# Patient Record
Sex: Male | Born: 1940
Health system: Southern US, Community
[De-identification: ages and names within clinical notes are randomized; demographics above are authoritative.]

## PROBLEM LIST (undated history)

## (undated) DIAGNOSIS — I69322 Dysarthria following cerebral infarction: Secondary | ICD-10-CM

## (undated) DIAGNOSIS — D649 Anemia, unspecified: Secondary | ICD-10-CM

## (undated) DIAGNOSIS — N289 Disorder of kidney and ureter, unspecified: Secondary | ICD-10-CM

## (undated) DIAGNOSIS — F0390 Unspecified dementia without behavioral disturbance: Secondary | ICD-10-CM

## (undated) DIAGNOSIS — I639 Cerebral infarction, unspecified: Secondary | ICD-10-CM

## (undated) DIAGNOSIS — M48062 Spinal stenosis, lumbar region with neurogenic claudication: Secondary | ICD-10-CM

## (undated) DIAGNOSIS — J869 Pyothorax without fistula: Secondary | ICD-10-CM

## (undated) DIAGNOSIS — E785 Hyperlipidemia, unspecified: Secondary | ICD-10-CM

## (undated) DIAGNOSIS — M549 Dorsalgia, unspecified: Secondary | ICD-10-CM

## (undated) DIAGNOSIS — I69391 Dysphagia following cerebral infarction: Secondary | ICD-10-CM

## (undated) DIAGNOSIS — F329 Major depressive disorder, single episode, unspecified: Secondary | ICD-10-CM

## (undated) HISTORY — PX: KNEE SURGERY: SHX244

## (undated) HISTORY — PX: INGUINAL HERNIA REPAIR: SUR1180

## (undated) HISTORY — PX: NECK SURGERY: SHX720

## (undated) NOTE — *Deleted (*Deleted)
Speech Language Pathology Treatment: Dysphagia  Patient Details Name: [name] [name] MRN: [id number] DOB: 12/10/1940 Today's Date: 11/06/2019 Time: 1330-1430 SLP Time Calculation (min) (ACUTE ONLY): 60 min  Assessment / Plan / Recommendation Clinical Impression  Pt was seen for f/u toleration of Dysphagia 2 diet w/ thin liquids and education re: diet consistency and aspiration precautions at bedside-- pt assessed directly w/ lunch tray. Sister was present & engaged throughout session. Pt continues to present w/ fatigue and slower, deliberate engagement w/ weakness in overall strength/movements. He needed Mod+ assistance in positioning more upright in chair at bedside.  Per MRI-- ischemic infarct right corona radiata, punctate infarct right parietal lobe, remote lacnuar infarcts involving the bilater thalami, bifrontal white matter, pons; Extensive chronic microvascular ischemic disease. Per head CT-- atrophy, remote lacunar infarcts in the basal ganglia, no acute abnormalities. Unsure of pt's cognitive baseline. Pt drowsy/asleep upon arrival-- generally minimally verbal throughout session; Sister answered many questions re pt's status & care. Pt was responsive to basic questions, however required min increased time for response. Pt is missing Many Dentition.   Pt was assessed directly with PO trials of minced solid consistency via spoon (10x), thin liquid via straw (10x), & puree vis spoon (8x). Pt required assistance for upright positioning in chair; pt able to self-feed w/ set up assist. No overt clinical s/s of aspiration were noted. Pt required min cues to engage safe swallowing strategy of alternating liquids & solids-- especially recommend alternating moistened puree (such as apple sauce) and solids for more complete clearance of bolus residue from oral cavity d/t pt's overall deconditioning & cognitive status. Pt & sister were educated on option of drink supplements added into diet plan  as pt appears to show increased oral intake of liquids vs. w/ solids. Sister indicated pt typically requests straws for intake of liquids. Sister & pt were given education on general aspiration precautions & safe swallowing strategies; handouts provided.   Recommend continuation of current diet-- Dysphagia 2 (minced solids) w/ thin liquids: this consistency of diet is most beneficial moving forward d/t pt's lacking dentition, overall deconditioning and slower engagement. Dysphagia 2 diet allows for conservation of energy and consequent increased oral intake hopefully. Recommend following general aspiration precautions & safe swallowing strategies; alternate solids & liquid consistencies (Include applesauce or other moist puree at mealtime to alternate with solid foods). Recommend f/u w/ Dietician for drink supplements. No further ST services recommended at this time; NSG to reconsult if new needs arise. NSG & MD updated; education provided.     HPI HPI: 79yo male admitted 11/01/19 with AMS/confusion and possible overdose. PMH: lumbar spine stenosis, chronic back pain, kidney stones, urolithiasis. CTHead = atrophy, remote lacunar infarcts in the basal ganglia, no acute abnormalities. MRI = ischemic infarct right corona radiata, punctate infarct right parietal lobe, remote lacnuar infarcts involving the bilater thalami, bifrontal white matter, pons, extensive chronic microvascular ischemic disease      SLP Plan  All goals met; education provided to pt/Sister present. Handout given       Recommendations  Diet recommendations: Dysphagia 2 (fine chop);Thin liquid Liquids provided via: Straw Medication Administration: Whole meds with puree Supervision: Staff to assist with self feeding;Intermittent supervision to cue for compensatory strategies Compensations: Minimize environmental distractions;Slow rate;Small sips/bites;Follow solids with liquid Postural Changes and/or Swallow Maneuvers: Seated upright  90 degrees                Oral Care Recommendations: Oral care BID Follow up Recommendations: Skilled Nursing facility   if needed;24 hour supervision/assistance SLP Visit Diagnosis: Dysphagia, unspecified (R13.10) Plan: All goals met       GO                Amelia Kramer  Graduate Clinician 11/06/2019, 1:38 PM   The information in this patient note, response to treatment, and overall treatment plan developed has been reviewed and agreed upon by this clinician.  Katherine Watson, MS, CCC-SLP Speech Language Pathologist [facility] [phone]   

---

## 1999-06-04 ENCOUNTER — Encounter: Payer: Self-pay | Admitting: Neurosurgery

## 1999-06-04 ENCOUNTER — Ambulatory Visit (HOSPITAL_COMMUNITY): Admission: AD | Admit: 1999-06-04 | Discharge: 1999-06-04 | Payer: Self-pay | Admitting: Neurosurgery

## 2002-12-22 ENCOUNTER — Other Ambulatory Visit: Payer: Self-pay

## 2005-03-07 ENCOUNTER — Emergency Department: Payer: Self-pay | Admitting: Emergency Medicine

## 2005-03-16 ENCOUNTER — Ambulatory Visit: Payer: Self-pay

## 2005-03-17 ENCOUNTER — Ambulatory Visit: Payer: Self-pay | Admitting: Family Medicine

## 2005-03-18 ENCOUNTER — Ambulatory Visit: Payer: Self-pay | Admitting: Family Medicine

## 2005-03-24 ENCOUNTER — Ambulatory Visit: Payer: Self-pay | Admitting: Family Medicine

## 2005-07-14 ENCOUNTER — Emergency Department: Payer: Self-pay | Admitting: Emergency Medicine

## 2006-06-22 ENCOUNTER — Emergency Department: Payer: Self-pay | Admitting: Emergency Medicine

## 2007-05-14 ENCOUNTER — Ambulatory Visit: Payer: Self-pay | Admitting: Family Medicine

## 2007-05-25 ENCOUNTER — Ambulatory Visit: Payer: Self-pay | Admitting: Specialist

## 2007-09-11 ENCOUNTER — Emergency Department: Payer: Self-pay | Admitting: Emergency Medicine

## 2007-12-16 ENCOUNTER — Emergency Department (HOSPITAL_COMMUNITY): Admission: EM | Admit: 2007-12-16 | Discharge: 2007-12-17 | Payer: Self-pay | Admitting: Emergency Medicine

## 2007-12-21 ENCOUNTER — Inpatient Hospital Stay (HOSPITAL_COMMUNITY): Admission: RE | Admit: 2007-12-21 | Discharge: 2007-12-27 | Payer: Self-pay | Admitting: Orthopedic Surgery

## 2008-01-23 ENCOUNTER — Ambulatory Visit: Payer: Self-pay

## 2008-01-30 ENCOUNTER — Ambulatory Visit: Payer: Self-pay

## 2008-03-03 ENCOUNTER — Emergency Department (HOSPITAL_COMMUNITY): Admission: EM | Admit: 2008-03-03 | Discharge: 2008-03-03 | Payer: Self-pay | Admitting: Emergency Medicine

## 2008-10-07 ENCOUNTER — Ambulatory Visit: Payer: Self-pay | Admitting: Pain Medicine

## 2008-10-15 ENCOUNTER — Ambulatory Visit: Payer: Self-pay | Admitting: Pain Medicine

## 2008-10-30 ENCOUNTER — Ambulatory Visit: Payer: Self-pay | Admitting: Pain Medicine

## 2008-11-03 ENCOUNTER — Ambulatory Visit: Payer: Self-pay | Admitting: Pain Medicine

## 2008-11-12 ENCOUNTER — Ambulatory Visit: Payer: Self-pay | Admitting: Pain Medicine

## 2008-11-20 ENCOUNTER — Ambulatory Visit: Payer: Self-pay | Admitting: Pain Medicine

## 2008-11-26 ENCOUNTER — Ambulatory Visit: Payer: Self-pay | Admitting: Pain Medicine

## 2008-12-09 ENCOUNTER — Ambulatory Visit: Payer: Self-pay | Admitting: Pain Medicine

## 2008-12-15 ENCOUNTER — Ambulatory Visit: Payer: Self-pay | Admitting: Pain Medicine

## 2009-01-12 ENCOUNTER — Ambulatory Visit: Payer: Self-pay | Admitting: Pain Medicine

## 2009-01-21 ENCOUNTER — Ambulatory Visit: Payer: Self-pay | Admitting: Pain Medicine

## 2009-02-11 ENCOUNTER — Ambulatory Visit: Payer: Self-pay | Admitting: Pain Medicine

## 2009-02-18 ENCOUNTER — Ambulatory Visit: Payer: Self-pay | Admitting: Pain Medicine

## 2009-03-10 ENCOUNTER — Ambulatory Visit: Payer: Self-pay | Admitting: Pain Medicine

## 2009-04-07 ENCOUNTER — Ambulatory Visit: Payer: Self-pay | Admitting: Pain Medicine

## 2009-04-15 ENCOUNTER — Ambulatory Visit: Payer: Self-pay | Admitting: Pain Medicine

## 2009-05-07 ENCOUNTER — Ambulatory Visit: Payer: Self-pay | Admitting: Pain Medicine

## 2009-06-02 ENCOUNTER — Ambulatory Visit: Payer: Self-pay | Admitting: Pain Medicine

## 2009-06-06 ENCOUNTER — Emergency Department (HOSPITAL_COMMUNITY): Admission: EM | Admit: 2009-06-06 | Discharge: 2009-06-07 | Payer: Self-pay | Admitting: Emergency Medicine

## 2009-07-02 ENCOUNTER — Ambulatory Visit: Payer: Self-pay | Admitting: Pain Medicine

## 2009-07-16 ENCOUNTER — Ambulatory Visit: Payer: Self-pay | Admitting: Family Medicine

## 2009-07-20 ENCOUNTER — Ambulatory Visit: Payer: Self-pay | Admitting: Pain Medicine

## 2009-08-13 ENCOUNTER — Ambulatory Visit (HOSPITAL_COMMUNITY)
Admission: RE | Admit: 2009-08-13 | Discharge: 2009-08-14 | Payer: Self-pay | Source: Home / Self Care | Admitting: Neurosurgery

## 2009-09-01 ENCOUNTER — Ambulatory Visit: Payer: Self-pay | Admitting: Pain Medicine

## 2009-09-08 ENCOUNTER — Encounter: Admission: RE | Admit: 2009-09-08 | Discharge: 2009-09-08 | Payer: Self-pay | Admitting: Neurosurgery

## 2009-10-06 ENCOUNTER — Ambulatory Visit: Payer: Self-pay | Admitting: Pain Medicine

## 2009-10-12 ENCOUNTER — Ambulatory Visit: Payer: Self-pay | Admitting: Neurosurgery

## 2009-11-02 ENCOUNTER — Ambulatory Visit: Payer: Self-pay | Admitting: Pain Medicine

## 2009-12-01 ENCOUNTER — Ambulatory Visit: Payer: Self-pay | Admitting: Pain Medicine

## 2009-12-30 ENCOUNTER — Ambulatory Visit: Payer: Self-pay | Admitting: Pain Medicine

## 2010-01-28 ENCOUNTER — Ambulatory Visit: Payer: Self-pay | Admitting: Pain Medicine

## 2010-02-10 ENCOUNTER — Ambulatory Visit: Payer: Self-pay | Admitting: Pain Medicine

## 2010-03-25 ENCOUNTER — Ambulatory Visit: Payer: Self-pay | Admitting: Pain Medicine

## 2010-04-05 ENCOUNTER — Ambulatory Visit: Payer: Self-pay | Admitting: Neurosurgery

## 2010-04-18 LAB — CBC
HCT: 43.5 % (ref 39.0–52.0)
Hemoglobin: 14.2 g/dL (ref 13.0–17.0)
MCH: 27.5 pg (ref 26.0–34.0)
MCHC: 32.7 g/dL (ref 30.0–36.0)
MCV: 84.2 fL (ref 78.0–100.0)
Platelets: 175 10*3/uL (ref 150–400)
RBC: 5.17 MIL/uL (ref 4.22–5.81)
RDW: 15.1 % (ref 11.5–15.5)
WBC: 9.9 10*3/uL (ref 4.0–10.5)

## 2010-04-18 LAB — BASIC METABOLIC PANEL
BUN: 9 mg/dL (ref 6–23)
CO2: 29 mEq/L (ref 19–32)
Calcium: 8.9 mg/dL (ref 8.4–10.5)
Chloride: 105 mEq/L (ref 96–112)
Creatinine, Ser: 0.98 mg/dL (ref 0.4–1.5)
GFR calc Af Amer: >60 mL/min (ref >60)
GFR calc non Af Amer: >60 mL/min (ref >60)
Glucose, Bld: 97 mg/dL (ref 70–99)
Potassium: 4.3 mEq/L (ref 3.5–5.1)
Sodium: 140 mEq/L (ref 135–145)

## 2010-04-18 LAB — SURGICAL PCR SCREEN
MRSA, PCR: NEGATIVE
Staphylococcus aureus: NEGATIVE

## 2010-04-20 ENCOUNTER — Ambulatory Visit: Payer: Self-pay | Admitting: Pain Medicine

## 2010-04-22 ENCOUNTER — Other Ambulatory Visit: Payer: Self-pay | Admitting: Neurosurgery

## 2010-04-22 DIAGNOSIS — M545 Low back pain, unspecified: Secondary | ICD-10-CM

## 2010-04-29 ENCOUNTER — Ambulatory Visit
Admission: RE | Admit: 2010-04-29 | Discharge: 2010-04-29 | Disposition: A | Discharge status: Home / Self Care | Payer: Medicare PPO | Source: Ambulatory Visit | Attending: Neurosurgery | Admitting: Neurosurgery

## 2010-04-29 DIAGNOSIS — M545 Low back pain, unspecified: Secondary | ICD-10-CM

## 2010-05-18 LAB — URINE CULTURE
Colony Count: NO GROWTH
Culture: NO GROWTH

## 2010-05-18 LAB — URINALYSIS, ROUTINE W REFLEX MICROSCOPIC
Bilirubin Urine: NEGATIVE
Glucose, UA: NEGATIVE mg/dL
Hgb urine dipstick: NEGATIVE
Ketones, ur: NEGATIVE mg/dL
Nitrite: NEGATIVE
Protein, ur: NEGATIVE mg/dL
Specific Gravity, Urine: 1.031 — ABNORMAL HIGH (ref 1.005–1.030)
Urobilinogen, UA: 0.2 mg/dL (ref 0.0–1.0)
pH: 5.5 (ref 5.0–8.0)

## 2010-05-20 ENCOUNTER — Ambulatory Visit: Payer: Self-pay | Admitting: Pain Medicine

## 2010-06-15 NOTE — Op Note (Signed)
NAME:  [name] [name], HUMBER NO.:  [id number]   MEDICAL RECORD NO.:  [id number]          PATIENT TYPE:  OIB   LOCATION:  5034                         FACILITY:  [facility]   PHYSICIAN:  Eulas Post, MD    DATE OF BIRTH:  [birth year]   DATE OF PROCEDURE:  12/21/2007  DATE OF DISCHARGE:                               OPERATIVE REPORT   PREOPERATIVE DIAGNOSIS:  Right septic prepatellar bursitis.   POSTOPERATIVE DIAGNOSES:  Right septic prepatellar bursitis and right  septic joint.   OPERATIVE PROCEDURE:  Right knee incision, irrigation, and debridement  of the right septic joint, and excision of right prepatellar septic  bursa.   ANESTHESIA:  General.   ESTIMATED BLOOD LOSS:  150 mL.   PREOPERATIVE INDICATIONS:  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  He was  seen by me earlier last week and was having some difficulty with his  knee but getting along all right.  He was having a recurrence of his  septic prepatellar bursitis.  We have scheduled him for right  prepatellar bursectomy that was planned to be performed next Tuesday.  Over the course of the week, however, he got severely worse and had  severe pain and increasing drainage and actually had a large fluid  collection develop around his knee.  Therefore, he came to our clinic  today and was brought emergently to the operating room for I&D.  The  risks, benefits, and alternatives were discussed with the family  preoperatively including, but not limited to, risks of infection,  bleeding, nerve injury, osteomyelitis, need for further surgical  debridement, post infectious arthritis, stiffness, loss of function,  cardiopulmonary complications, permanent dysfunction, among others, and  they were willing to proceed.   OPERATIVE FINDINGS:  There was a fairly extensive necrotic area of  prepatellar bursal tissue that had been destroyed by the  infection.  There was a sinus tract exiting distal to the inferior pole of the  patella.  There was also a very large necrotic area in the superolateral  portion of the prepatellar bursa.  This did track down and actually was  violating the lateral superior aspect of the quadriceps tendon just at  the superior pole of the patella on the lateral side.  There was a large  joint effusion.  There was necrotic, purulent fluid tracking down  through the area and the quadriceps tendon that had been eaten by the  infection.  The infection had also eaten away about a 1.5 x 2 cm area of  skin over the lateral aspect of the patella, actually not the patella  but just lateral to where the patella was.   OPERATIVE PROCEDURE:  The patient was brought to the operating room,  placed in the supine position.  General anesthesia was administered.  Antibiotics were held prior to sending cultures.  The right lower  extremity was prepped and draped in the usual sterile fashion.  I  contemplated various different options for the incision, recognizing  that the worst of the infection was located laterally,  however, he had  recurrent midline prepatellar bursitis as well as a midline draining  sinus tract.  Therefore, I decided to perform a midline incision in  order to be able to gain access to all the different parts of the  patella and also tried to preserve a skin bridge between the portion of  the lateral part of the knee, which had necrotic skin.  Therefore, I  made a midline incision and then exposed the prepatellar bursa and  excised it.  Extensive debridement was carried out using both sharp  instruments as well as rongeur and curette.  The sinus tract below the  patella was excised.  The superolateral portion of the wound was  explored, it was found that the infection had violated the quadriceps  tendon, and there was a large joint effusion with purulent fluid.  Therefore, all of this was evacuated; and  I performed a small release of  the quadriceps on the lateral side comparable to what we do on a total  knee on the medial side, however, I only went down to about 10 o'clock  on the patella.  I opened the quadriceps tendon just enough to be able  to perform an adequate synovectomy of the superior pouch and to irrigate  copiously to fit the pulse lavage directly down into the joint.  I  placed a cannula through the inferomedial portal and used this as an  outflow and irrigated it to a total of 9 L through the prepatellar bursa  and through the joint.  The tissue appeared clean, and I resected the  necrotic skin sharply with a knife.  This was basically a transverse or  more like a circular patch of skin that had been lost.  Therefore, I  turned my attention to closure.  I placed a deep Hemovac vac drain in  the joint through the superomedial portal, and I placed a Penrose drain  into the prepatellar bursal space.  Once I had achieved this, I closed  the quadriceps tendon with PDS suture, size 0, and closed the skin with  nylon stitches.  Xeroform was applied, and the drains were connected,  and the wounds were dressed with sterile gauze followed by a long leg  Ace wrap.  The cultures were taken, and the patient received vancomycin  after the cultures were given.  Tissue was also sent.  I did not find  any evidence for osteomyelitis, and I did not find any areas where the  infection appeared to enter the bone.  The patient will be on IV  antibiotics, and we will await for culture results.  The patient  tolerated the procedure well.  There were no complications.      Eulas Post, MD  Electronically Signed     JPL/MEDQ  D:  12/21/2007  T:  12/22/2007  Job:  [id number]

## 2010-06-17 ENCOUNTER — Ambulatory Visit: Payer: Self-pay | Admitting: Pain Medicine

## 2010-06-18 NOTE — Discharge Summary (Signed)
NAME:  [name], [name] NO.:  [id number]   MEDICAL RECORD NO.:  [id number]          PATIENT TYPE:  INP   LOCATION:  5034                         FACILITY:  [facility]   PHYSICIAN:  Eulas Post, MD    DATE OF BIRTH:  1941/01/27   DATE OF ADMISSION:  12/21/2007  DATE OF DISCHARGE:  12/27/2007                               DISCHARGE SUMMARY   ADMISSION DIAGNOSIS:  Right septic prepatellar bursitis.   DISCHARGE DIAGNOSIS:  Right septic prepatellar bursitis and right septic  joint.   DISCHARGE MEDICATION:  Ancef 2 g IV q.8 h.   PRIMARY PROCEDURE:  Right prepatellar bursectomy and septic knee  incision and drainage, performed on December 21, 2007.   HOSPITAL COURSE:  [name] is a 70 year old man who had a  chronic prepatellar bursitis that was septic.  This subsequently spread  to his knee joint.  He was brought emergently to the operating room for  I&D.  Initially, he was scheduled electively to perform the bursectomy  during the following week, but upon presentation, it was appreciated  that the infection had actually spread down deep into the joint.  Therefore, he underwent emergent I&D.  He was given perioperative  antibiotics in the form of vancomycin until we received cultures back.  Cultures revealed methicillin-resistant Staphylococcus aureus.  Subsequently, he was switched over to Ancef 2 g IV q.8 h.  He had drains  that were discontinued on postoperative day 2.  He had a PICC line  placed as well.  He had an early reaccumulation of fluid within the  joint, and I attempted re-aspiration, which did not yield anything.  He  became afebrile, and his wounds were clean at the time of discharge.  He  was planned to be discharged home with a followup with me in  approximately 1 week for a wound check.  He benefited maximally from his  hospital stay, and there were no complications.      Eulas Post, MD  Electronically Signed     JPL/MEDQ   D:  02/05/2008  T:  02/05/2008  Job:  [id number]

## 2010-06-23 ENCOUNTER — Ambulatory Visit: Payer: Self-pay | Admitting: Pain Medicine

## 2010-07-15 ENCOUNTER — Ambulatory Visit: Payer: Self-pay | Admitting: Pain Medicine

## 2010-08-12 ENCOUNTER — Ambulatory Visit: Payer: Self-pay | Admitting: Pain Medicine

## 2010-09-09 ENCOUNTER — Ambulatory Visit: Payer: Self-pay | Admitting: Pain Medicine

## 2010-09-22 ENCOUNTER — Ambulatory Visit: Payer: Self-pay | Admitting: Pain Medicine

## 2010-10-07 ENCOUNTER — Ambulatory Visit: Payer: Self-pay | Admitting: Pain Medicine

## 2010-10-27 ENCOUNTER — Other Ambulatory Visit: Payer: Self-pay | Admitting: Neurosurgery

## 2010-10-27 DIAGNOSIS — M542 Cervicalgia: Secondary | ICD-10-CM

## 2010-11-03 LAB — PROTIME-INR
INR: 1.2
Prothrombin Time: 15.8 — ABNORMAL HIGH

## 2010-11-03 LAB — CBC
HCT: 31.2 — ABNORMAL LOW
HCT: 32.2 — ABNORMAL LOW
HCT: 32.9 — ABNORMAL LOW
HCT: 39.5
HCT: 40.8
Hemoglobin: 10.3 — ABNORMAL LOW
Hemoglobin: 10.6 — ABNORMAL LOW
Hemoglobin: 10.9 — ABNORMAL LOW
Hemoglobin: 12.9 — ABNORMAL LOW
Hemoglobin: 13.2
MCHC: 32.3
MCHC: 32.6
MCHC: 33
MCHC: 33.1
MCHC: 33.3
MCV: 83
MCV: 83
MCV: 83
MCV: 83.3
MCV: 83.5
Platelets: 225
Platelets: 231
Platelets: 236
Platelets: 253
Platelets: 260
RBC: 3.76 — ABNORMAL LOW
RBC: 3.87 — ABNORMAL LOW
RBC: 3.94 — ABNORMAL LOW
RBC: 4.75
RBC: 4.92
RDW: 13.4
RDW: 13.8
RDW: 13.9
RDW: 13.9
RDW: 14.5
WBC: 11.5 — ABNORMAL HIGH
WBC: 12.9 — ABNORMAL HIGH
WBC: 13.4 — ABNORMAL HIGH
WBC: 13.7 — ABNORMAL HIGH
WBC: 20.5 — ABNORMAL HIGH

## 2010-11-03 LAB — DIFFERENTIAL
Basophils Absolute: 0
Basophils Relative: 0
Eosinophils Absolute: 0.3
Eosinophils Relative: 2
Lymphocytes Relative: 17
Lymphs Abs: 2.2
Monocytes Absolute: 0.9
Monocytes Relative: 7
Neutro Abs: 9.9 — ABNORMAL HIGH
Neutrophils Relative %: 74

## 2010-11-03 LAB — BASIC METABOLIC PANEL
BUN: 11
BUN: 12
BUN: 5 — ABNORMAL LOW
BUN: 8
CO2: 25
CO2: 27
CO2: 27
CO2: 28
Calcium: 7.9 — ABNORMAL LOW
Calcium: 8.1 — ABNORMAL LOW
Calcium: 8.3 — ABNORMAL LOW
Calcium: 8.8
Chloride: 102
Chloride: 104
Chloride: 105
Chloride: 99
Creatinine, Ser: 0.73
Creatinine, Ser: 0.81
Creatinine, Ser: 0.83
Creatinine, Ser: 0.9
GFR calc Af Amer: >60
GFR calc Af Amer: >60
GFR calc Af Amer: >60
GFR calc Af Amer: >60
GFR calc non Af Amer: >60
GFR calc non Af Amer: >60
GFR calc non Af Amer: >60
GFR calc non Af Amer: >60
Glucose, Bld: 103 — ABNORMAL HIGH
Glucose, Bld: 104 — ABNORMAL HIGH
Glucose, Bld: 112 — ABNORMAL HIGH
Glucose, Bld: 113 — ABNORMAL HIGH
Potassium: 3.8
Potassium: 4
Potassium: 4
Potassium: 4.4
Sodium: 133 — ABNORMAL LOW
Sodium: 134 — ABNORMAL LOW
Sodium: 136
Sodium: 138

## 2010-11-03 LAB — BODY FLUID CULTURE

## 2010-11-03 LAB — POCT I-STAT, CHEM 8
BUN: 14
Calcium, Ion: 1.11 — ABNORMAL LOW
Chloride: 102
Creatinine, Ser: 1
Glucose, Bld: 118 — ABNORMAL HIGH
HCT: 41
Hemoglobin: 13.9
Potassium: 4.2
Sodium: 137
TCO2: 26

## 2010-11-03 LAB — ANAEROBIC CULTURE

## 2010-11-03 LAB — SEDIMENTATION RATE: Sed Rate: 50 — ABNORMAL HIGH

## 2010-11-03 LAB — C-REACTIVE PROTEIN: CRP: 20.3 — ABNORMAL HIGH (ref <0.6)

## 2010-11-03 LAB — TISSUE CULTURE

## 2010-11-03 LAB — APTT: aPTT: 34

## 2010-11-08 ENCOUNTER — Ambulatory Visit
Admission: RE | Admit: 2010-11-08 | Discharge: 2010-11-08 | Disposition: A | Discharge status: Home / Self Care | Payer: Medicare PPO | Source: Ambulatory Visit | Attending: Neurosurgery | Admitting: Neurosurgery

## 2010-11-08 DIAGNOSIS — M542 Cervicalgia: Secondary | ICD-10-CM

## 2010-11-16 ENCOUNTER — Ambulatory Visit: Payer: Self-pay | Admitting: Pain Medicine

## 2010-12-14 ENCOUNTER — Ambulatory Visit: Payer: Self-pay | Admitting: Pain Medicine

## 2010-12-25 ENCOUNTER — Ambulatory Visit: Payer: Self-pay | Admitting: Pain Medicine

## 2010-12-27 ENCOUNTER — Ambulatory Visit: Payer: Self-pay | Admitting: Pain Medicine

## 2011-01-13 ENCOUNTER — Ambulatory Visit: Payer: Self-pay | Admitting: Pain Medicine

## 2011-02-09 ENCOUNTER — Ambulatory Visit: Payer: Self-pay | Admitting: Pain Medicine

## 2011-03-15 ENCOUNTER — Ambulatory Visit: Payer: Self-pay | Admitting: Pain Medicine

## 2011-04-12 ENCOUNTER — Ambulatory Visit: Payer: Self-pay | Admitting: Pain Medicine

## 2011-04-26 ENCOUNTER — Ambulatory Visit: Payer: Self-pay | Admitting: Pain Medicine

## 2011-05-18 ENCOUNTER — Ambulatory Visit: Payer: Self-pay | Admitting: Pain Medicine

## 2011-06-16 ENCOUNTER — Ambulatory Visit: Payer: Self-pay | Admitting: Pain Medicine

## 2011-07-06 ENCOUNTER — Ambulatory Visit: Payer: Self-pay | Admitting: Pain Medicine

## 2011-08-11 ENCOUNTER — Ambulatory Visit: Payer: Self-pay | Admitting: Pain Medicine

## 2011-08-24 ENCOUNTER — Ambulatory Visit: Payer: Self-pay | Admitting: Pain Medicine

## 2011-10-06 ENCOUNTER — Ambulatory Visit: Payer: Self-pay | Admitting: Pain Medicine

## 2011-10-24 ENCOUNTER — Ambulatory Visit: Payer: Self-pay | Admitting: Pain Medicine

## 2011-12-06 ENCOUNTER — Ambulatory Visit: Payer: Self-pay | Admitting: Pain Medicine

## 2012-01-02 ENCOUNTER — Inpatient Hospital Stay: Payer: Self-pay | Admitting: Internal Medicine

## 2012-01-02 LAB — URINALYSIS, COMPLETE
Bilirubin,UR: NEGATIVE
Glucose,UR: NEGATIVE mg/dL (ref 0–75)
Nitrite: NEGATIVE
Ph: 6 (ref 4.5–8.0)
Protein: 30
RBC,UR: 6 /HPF (ref 0–5)
Specific Gravity: 1.017 (ref 1.003–1.030)
Squamous Epithelial: <1
WBC UR: 220 /HPF (ref 0–5)

## 2012-01-02 LAB — BASIC METABOLIC PANEL
Anion Gap: 10 (ref 7–16)
BUN: 37 mg/dL — ABNORMAL HIGH (ref 7–18)
Calcium, Total: 8.9 mg/dL (ref 8.5–10.1)
Chloride: 99 mmol/L (ref 98–107)
Co2: 20 mmol/L — ABNORMAL LOW (ref 21–32)
Creatinine: 2.74 mg/dL — ABNORMAL HIGH (ref 0.60–1.30)
EGFR (African American): 26 — ABNORMAL LOW
EGFR (Non-African Amer.): 22 — ABNORMAL LOW
Glucose: 112 mg/dL — ABNORMAL HIGH (ref 65–99)
Osmolality: 268 (ref 275–301)
Potassium: 4.2 mmol/L (ref 3.5–5.1)
Sodium: 129 mmol/L — ABNORMAL LOW (ref 136–145)

## 2012-01-02 LAB — CBC
HCT: 43 % (ref 40.0–52.0)
HGB: 14.2 g/dL (ref 13.0–18.0)
MCH: 27 pg (ref 26.0–34.0)
MCHC: 33 g/dL (ref 32.0–36.0)
MCV: 82 fL (ref 80–100)
Platelet: 203 10*3/uL (ref 150–440)
RBC: 5.25 10*6/uL (ref 4.40–5.90)
RDW: 14.7 % — ABNORMAL HIGH (ref 11.5–14.5)
WBC: 11.5 10*3/uL — ABNORMAL HIGH (ref 3.8–10.6)

## 2012-01-03 LAB — BASIC METABOLIC PANEL
Anion Gap: 9 (ref 7–16)
BUN: 35 mg/dL — ABNORMAL HIGH (ref 7–18)
Calcium, Total: 8.2 mg/dL — ABNORMAL LOW (ref 8.5–10.1)
Chloride: 105 mmol/L (ref 98–107)
Co2: 20 mmol/L — ABNORMAL LOW (ref 21–32)
Creatinine: 2.47 mg/dL — ABNORMAL HIGH (ref 0.60–1.30)
EGFR (African American): 29 — ABNORMAL LOW
EGFR (Non-African Amer.): 25 — ABNORMAL LOW
Glucose: 103 mg/dL — ABNORMAL HIGH (ref 65–99)
Osmolality: 276 (ref 275–301)
Potassium: 4 mmol/L (ref 3.5–5.1)
Sodium: 134 mmol/L — ABNORMAL LOW (ref 136–145)

## 2012-01-03 LAB — CBC WITH DIFFERENTIAL/PLATELET
Basophil #: 0.1 10*3/uL (ref 0.0–0.1)
Basophil %: 1 %
Eosinophil #: 0.1 10*3/uL (ref 0.0–0.7)
Eosinophil %: 0.8 %
HCT: 34.4 % — ABNORMAL LOW (ref 40.0–52.0)
HGB: 11.3 g/dL — ABNORMAL LOW (ref 13.0–18.0)
Lymphocyte #: 1.3 10*3/uL (ref 1.0–3.6)
Lymphocyte %: 14.5 %
MCH: 26.8 pg (ref 26.0–34.0)
MCHC: 32.8 g/dL (ref 32.0–36.0)
MCV: 82 fL (ref 80–100)
Monocyte #: 1.8 x10 3/mm — ABNORMAL HIGH (ref 0.2–1.0)
Monocyte %: 19.4 %
Neutrophil #: 6 10*3/uL (ref 1.4–6.5)
Neutrophil %: 64.3 %
Platelet: 180 10*3/uL (ref 150–440)
RBC: 4.2 10*6/uL — ABNORMAL LOW (ref 4.40–5.90)
RDW: 14.3 % (ref 11.5–14.5)
WBC: 9.3 10*3/uL (ref 3.8–10.6)

## 2012-01-04 LAB — BASIC METABOLIC PANEL
Anion Gap: 8 (ref 7–16)
BUN: 26 mg/dL — ABNORMAL HIGH (ref 7–18)
Calcium, Total: 7.8 mg/dL — ABNORMAL LOW (ref 8.5–10.1)
Chloride: 109 mmol/L — ABNORMAL HIGH (ref 98–107)
Co2: 20 mmol/L — ABNORMAL LOW (ref 21–32)
Creatinine: 1.66 mg/dL — ABNORMAL HIGH (ref 0.60–1.30)
EGFR (African American): 48 — ABNORMAL LOW
EGFR (Non-African Amer.): 41 — ABNORMAL LOW
Glucose: 114 mg/dL — ABNORMAL HIGH (ref 65–99)
Osmolality: 279 (ref 275–301)
Potassium: 3.7 mmol/L (ref 3.5–5.1)
Sodium: 137 mmol/L (ref 136–145)

## 2012-01-04 LAB — HEMOGLOBIN A1C: Hemoglobin A1C: 5.3 % (ref 4.2–6.3)

## 2012-01-04 LAB — HEMOGLOBIN: HGB: 10.8 g/dL — ABNORMAL LOW (ref 13.0–18.0)

## 2012-01-04 LAB — URINE CULTURE

## 2012-01-05 LAB — BASIC METABOLIC PANEL
Anion Gap: 5 — ABNORMAL LOW (ref 7–16)
BUN: 18 mg/dL (ref 7–18)
Calcium, Total: 7.7 mg/dL — ABNORMAL LOW (ref 8.5–10.1)
Chloride: 114 mmol/L — ABNORMAL HIGH (ref 98–107)
Co2: 21 mmol/L (ref 21–32)
Creatinine: 1.26 mg/dL (ref 0.60–1.30)
EGFR (African American): >60
EGFR (Non-African Amer.): 57 — ABNORMAL LOW
Glucose: 104 mg/dL — ABNORMAL HIGH (ref 65–99)
Osmolality: 282 (ref 275–301)
Potassium: 3.7 mmol/L (ref 3.5–5.1)
Sodium: 140 mmol/L (ref 136–145)

## 2012-01-06 LAB — CULTURE, BLOOD (SINGLE)

## 2012-01-08 LAB — CULTURE, BLOOD (SINGLE)

## 2012-01-09 ENCOUNTER — Ambulatory Visit: Payer: Self-pay | Admitting: Urology

## 2012-01-17 ENCOUNTER — Ambulatory Visit: Payer: Self-pay | Admitting: Urology

## 2012-01-17 ENCOUNTER — Ambulatory Visit: Payer: Self-pay | Admitting: Pain Medicine

## 2012-02-03 ENCOUNTER — Ambulatory Visit: Payer: Self-pay | Admitting: Urology

## 2012-02-09 ENCOUNTER — Ambulatory Visit: Payer: Self-pay | Admitting: Urology

## 2012-02-16 ENCOUNTER — Ambulatory Visit: Payer: Self-pay | Admitting: Pain Medicine

## 2012-02-23 ENCOUNTER — Ambulatory Visit: Payer: Self-pay | Admitting: Urology

## 2012-03-13 ENCOUNTER — Ambulatory Visit: Payer: Self-pay | Admitting: Pain Medicine

## 2012-03-21 ENCOUNTER — Ambulatory Visit: Payer: Self-pay | Admitting: Pain Medicine

## 2012-04-12 ENCOUNTER — Ambulatory Visit: Payer: Self-pay | Admitting: Pain Medicine

## 2012-05-10 ENCOUNTER — Ambulatory Visit: Payer: Self-pay | Admitting: Pain Medicine

## 2012-06-12 ENCOUNTER — Ambulatory Visit: Payer: Self-pay | Admitting: Pain Medicine

## 2012-07-12 ENCOUNTER — Ambulatory Visit: Payer: Self-pay | Admitting: Pain Medicine

## 2012-07-23 ENCOUNTER — Ambulatory Visit: Payer: Self-pay | Admitting: Pain Medicine

## 2012-08-14 ENCOUNTER — Ambulatory Visit: Payer: Self-pay | Admitting: Pain Medicine

## 2012-09-13 ENCOUNTER — Ambulatory Visit: Payer: Self-pay | Admitting: Pain Medicine

## 2012-10-09 ENCOUNTER — Ambulatory Visit: Payer: Self-pay | Admitting: Pain Medicine

## 2012-11-08 ENCOUNTER — Ambulatory Visit: Payer: Self-pay | Admitting: Pain Medicine

## 2012-12-11 ENCOUNTER — Ambulatory Visit: Payer: Self-pay | Admitting: Pain Medicine

## 2013-01-10 ENCOUNTER — Ambulatory Visit: Payer: Self-pay | Admitting: Pain Medicine

## 2013-02-07 ENCOUNTER — Ambulatory Visit: Payer: Self-pay | Admitting: Pain Medicine

## 2013-03-12 ENCOUNTER — Ambulatory Visit: Payer: Self-pay | Admitting: Pain Medicine

## 2013-04-03 ENCOUNTER — Ambulatory Visit: Payer: Self-pay | Admitting: Pain Medicine

## 2013-04-09 ENCOUNTER — Ambulatory Visit: Payer: Self-pay | Admitting: Pain Medicine

## 2013-05-09 ENCOUNTER — Ambulatory Visit: Payer: Self-pay | Admitting: Pain Medicine

## 2013-06-11 ENCOUNTER — Ambulatory Visit: Payer: Self-pay | Admitting: Pain Medicine

## 2013-07-10 ENCOUNTER — Ambulatory Visit: Payer: Self-pay | Admitting: Pain Medicine

## 2013-08-08 ENCOUNTER — Ambulatory Visit: Payer: Self-pay | Admitting: Pain Medicine

## 2013-09-05 ENCOUNTER — Ambulatory Visit: Payer: Self-pay | Admitting: Pain Medicine

## 2013-10-03 ENCOUNTER — Ambulatory Visit: Payer: Self-pay | Admitting: Pain Medicine

## 2013-10-31 ENCOUNTER — Ambulatory Visit: Payer: Self-pay | Admitting: Pain Medicine

## 2013-12-03 ENCOUNTER — Ambulatory Visit: Payer: Self-pay | Admitting: Pain Medicine

## 2014-01-02 ENCOUNTER — Ambulatory Visit: Payer: Self-pay | Admitting: Pain Medicine

## 2014-02-03 ENCOUNTER — Ambulatory Visit: Payer: Self-pay | Admitting: Pain Medicine

## 2014-02-03 DIAGNOSIS — M47896 Other spondylosis, lumbar region: Secondary | ICD-10-CM | POA: Diagnosis not present

## 2014-02-03 DIAGNOSIS — M4806 Spinal stenosis, lumbar region: Secondary | ICD-10-CM | POA: Diagnosis not present

## 2014-02-03 DIAGNOSIS — R51 Headache: Secondary | ICD-10-CM | POA: Diagnosis not present

## 2014-02-03 DIAGNOSIS — G894 Chronic pain syndrome: Secondary | ICD-10-CM | POA: Diagnosis not present

## 2014-02-03 DIAGNOSIS — Z79899 Other long term (current) drug therapy: Secondary | ICD-10-CM | POA: Diagnosis not present

## 2014-02-03 DIAGNOSIS — F1120 Opioid dependence, uncomplicated: Secondary | ICD-10-CM | POA: Diagnosis not present

## 2014-03-02 ENCOUNTER — Emergency Department (HOSPITAL_BASED_OUTPATIENT_CLINIC_OR_DEPARTMENT_OTHER): Payer: Commercial Managed Care - HMO

## 2014-03-02 ENCOUNTER — Emergency Department (HOSPITAL_BASED_OUTPATIENT_CLINIC_OR_DEPARTMENT_OTHER)
Admission: EM | Admit: 2014-03-02 | Discharge: 2014-03-02 | Disposition: A | Discharge status: Home / Self Care | Payer: Commercial Managed Care - HMO | Attending: Emergency Medicine | Admitting: Emergency Medicine

## 2014-03-02 ENCOUNTER — Encounter (HOSPITAL_BASED_OUTPATIENT_CLINIC_OR_DEPARTMENT_OTHER): Payer: Self-pay | Admitting: *Deleted

## 2014-03-02 DIAGNOSIS — R1032 Left lower quadrant pain: Secondary | ICD-10-CM | POA: Diagnosis not present

## 2014-03-02 DIAGNOSIS — F17200 Nicotine dependence, unspecified, uncomplicated: Secondary | ICD-10-CM | POA: Diagnosis not present

## 2014-03-02 DIAGNOSIS — R109 Unspecified abdominal pain: Secondary | ICD-10-CM

## 2014-03-02 DIAGNOSIS — I700 Atherosclerosis of aorta: Secondary | ICD-10-CM | POA: Diagnosis not present

## 2014-03-02 DIAGNOSIS — R05 Cough: Secondary | ICD-10-CM | POA: Diagnosis not present

## 2014-03-02 DIAGNOSIS — N200 Calculus of kidney: Secondary | ICD-10-CM | POA: Diagnosis not present

## 2014-03-02 DIAGNOSIS — R059 Cough, unspecified: Secondary | ICD-10-CM

## 2014-03-02 DIAGNOSIS — M545 Low back pain: Secondary | ICD-10-CM | POA: Diagnosis not present

## 2014-03-02 DIAGNOSIS — Z87448 Personal history of other diseases of urinary system: Secondary | ICD-10-CM | POA: Insufficient documentation

## 2014-03-02 DIAGNOSIS — Z720 Tobacco use: Secondary | ICD-10-CM | POA: Insufficient documentation

## 2014-03-02 DIAGNOSIS — J159 Unspecified bacterial pneumonia: Secondary | ICD-10-CM | POA: Diagnosis not present

## 2014-03-02 DIAGNOSIS — J189 Pneumonia, unspecified organism: Secondary | ICD-10-CM

## 2014-03-02 DIAGNOSIS — Z87442 Personal history of urinary calculi: Secondary | ICD-10-CM | POA: Diagnosis not present

## 2014-03-02 DIAGNOSIS — R634 Abnormal weight loss: Secondary | ICD-10-CM | POA: Diagnosis not present

## 2014-03-02 HISTORY — DX: Disorder of kidney and ureter, unspecified: N28.9

## 2014-03-02 LAB — COMPREHENSIVE METABOLIC PANEL
ALT: 12 U/L (ref 0–53)
AST: 17 U/L (ref 0–37)
Albumin: 3.7 g/dL (ref 3.5–5.2)
Alkaline Phosphatase: 94 U/L (ref 39–117)
Anion gap: 4 — ABNORMAL LOW (ref 5–15)
BUN: 14 mg/dL (ref 6–23)
CO2: 28 mmol/L (ref 19–32)
Calcium: 8.7 mg/dL (ref 8.4–10.5)
Chloride: 104 mmol/L (ref 96–112)
Creatinine, Ser: 1.02 mg/dL (ref 0.50–1.35)
GFR calc Af Amer: 83 mL/min — ABNORMAL LOW (ref >90)
GFR calc non Af Amer: 71 mL/min — ABNORMAL LOW (ref >90)
Glucose, Bld: 92 mg/dL (ref 70–99)
Potassium: 4.3 mmol/L (ref 3.5–5.1)
Sodium: 136 mmol/L (ref 135–145)
Total Bilirubin: 0.6 mg/dL (ref 0.3–1.2)
Total Protein: 7.6 g/dL (ref 6.0–8.3)

## 2014-03-02 LAB — URINALYSIS, ROUTINE W REFLEX MICROSCOPIC
Bilirubin Urine: NEGATIVE
Glucose, UA: NEGATIVE mg/dL
Hgb urine dipstick: NEGATIVE
Ketones, ur: NEGATIVE mg/dL
Nitrite: NEGATIVE
Protein, ur: NEGATIVE mg/dL
Specific Gravity, Urine: 1.014 (ref 1.005–1.030)
Urobilinogen, UA: 2 mg/dL — ABNORMAL HIGH (ref 0.0–1.0)
pH: 7 (ref 5.0–8.0)

## 2014-03-02 LAB — CBC WITH DIFFERENTIAL/PLATELET
Basophils Absolute: 0 10*3/uL (ref 0.0–0.1)
Basophils Relative: 0 % (ref 0–1)
Eosinophils Absolute: 0.1 10*3/uL (ref 0.0–0.7)
Eosinophils Relative: 1 % (ref 0–5)
HCT: 45.5 % (ref 39.0–52.0)
Hemoglobin: 14.6 g/dL (ref 13.0–17.0)
Lymphocytes Relative: 23 % (ref 12–46)
Lymphs Abs: 3.2 10*3/uL (ref 0.7–4.0)
MCH: 25.9 pg — ABNORMAL LOW (ref 26.0–34.0)
MCHC: 32.1 g/dL (ref 30.0–36.0)
MCV: 80.8 fL (ref 78.0–100.0)
Monocytes Absolute: 1.3 10*3/uL — ABNORMAL HIGH (ref 0.1–1.0)
Monocytes Relative: 10 % (ref 3–12)
Neutro Abs: 9.1 10*3/uL — ABNORMAL HIGH (ref 1.7–7.7)
Neutrophils Relative %: 66 % (ref 43–77)
Platelets: 235 10*3/uL (ref 150–400)
RBC: 5.63 MIL/uL (ref 4.22–5.81)
RDW: 14.7 % (ref 11.5–15.5)
WBC: 13.7 10*3/uL — ABNORMAL HIGH (ref 4.0–10.5)

## 2014-03-02 LAB — URINE MICROSCOPIC-ADD ON

## 2014-03-02 MED ORDER — MORPHINE SULFATE 4 MG/ML IJ SOLN
4.0000 mg | Freq: Once | INTRAMUSCULAR | Status: AC
  Administered 2014-03-02: 4 mg via INTRAVENOUS
  Filled 2014-03-02: qty 1

## 2014-03-02 MED ORDER — AZITHROMYCIN 250 MG PO TABS
500.0000 mg | ORAL_TABLET | Freq: Once | ORAL | Status: AC
  Administered 2014-03-02: 500 mg via ORAL
  Filled 2014-03-02: qty 2

## 2014-03-02 MED ORDER — AZITHROMYCIN 250 MG PO TABS: 250.0000 mg | ORAL_TABLET | Freq: Every day | ORAL | Status: DC

## 2014-03-02 NOTE — ED Notes (Signed)
Flank pain, urinary frequency

## 2014-03-02 NOTE — Discharge Instructions (Signed)

## 2014-03-02 NOTE — ED Notes (Signed)
Pt alert, NAD, calm, interactive, resps e/u, speaking in clear complete sentences, "feels better", L flank pain remains, (denies: nv, sob, dizziness, Ha or other sx), states, "hope I get to go home", wife present in ED.

## 2014-03-02 NOTE — ED Provider Notes (Signed)
CSN: [id number]     Arrival date & time 03/02/14  1654 History  This chart was scribed for [name], MD by Tula Nakayama, ED Scribe. This patient was seen in room MH02/MH02 and the patient's care was started at 5:56 PM.    Chief Complaint  Patient presents with  . Flank Pain   Patient is a 74 y.o. male presenting with flank pain and cough. The history is provided by the patient. No language interpreter was used.  Flank Pain This is a new problem. The current episode started more than 2 days ago. The problem occurs constantly. The problem has not changed since onset.Pertinent negatives include no chest pain, no abdominal pain, no headaches and no shortness of breath. Nothing aggravates the symptoms. Nothing relieves the symptoms. He has tried nothing for the symptoms. The treatment provided no relief.  Cough Cough characteristics:  Productive Sputum characteristics:  Green Severity:  Moderate Onset quality:  Gradual Duration:  3 weeks Timing:  Intermittent Progression:  Unchanged Chronicity:  New Context: sick contacts   Relieved by:  Nothing Worsened by:  Nothing tried Ineffective treatments:  None tried Associated symptoms: no chest pain, no headaches and no shortness of breath     HPI Comments: [name] [name] is a 74 y.o. male with a history of renal disorder and back pain who presents to the Emergency Department complaining of constant, waxing and waning left-sided back pain that started 1 week ago. His wife notes increased frequency as an associated symptom. Pt states pain is worse at night. He has tried Percocet with no relief. Pt has a history of kidney stones and states current symptoms are similar to prior episode. He denies abdominal pain and hematuria as associated symptoms.  Pt also complains of intermittent productive cough with greenish phlegm that started 3 weeks ago. He states congestion as an associated symptom. His wife notes that she has similar  symptoms. Pt denies SOB and CP as associated symptoms.   Past Medical History  Diagnosis Date  . Renal disorder     kidney stones   Past Surgical History  Procedure Laterality Date  . Knee surgery     No family history on file. History  Substance Use Topics  . Smoking status: Current Every Day Smoker  . Smokeless tobacco: Not on file  . Alcohol Use: No    Review of Systems  HENT: Positive for congestion.   Respiratory: Positive for cough. Negative for shortness of breath.   Cardiovascular: Negative for chest pain.  Gastrointestinal: Negative for abdominal pain.  Genitourinary: Positive for frequency and flank pain. Negative for hematuria.  Musculoskeletal: Positive for back pain.  Neurological: Negative for headaches.  All other systems reviewed and are negative.     Allergies  Review of patient's allergies indicates no known allergies.  Home Medications   Prior to Admission medications   Medication Sig Start Date End Date Taking? Authorizing Provider  oxycodone (OXY-IR) 5 MG capsule Take 10 mg by mouth every 4 (four) hours as needed.   Yes Historical Provider, MD   BP 122/64 mmHg  Pulse 74  Temp(Src) 97.8 F (36.6 C) (Oral)  Resp 18  SpO2 100% Physical Exam  Constitutional: He is oriented to person, place, and time. He appears well-developed and well-nourished. No distress.  HENT:  Head: Normocephalic and atraumatic.  Mouth/Throat: Oropharynx is clear and moist.  Eyes: Conjunctivae are normal. Pupils are equal, round, and reactive to light. No scleral icterus.  Neck: Neck  supple.  Cardiovascular: Normal rate, regular rhythm, normal heart sounds and intact distal pulses.   No murmur heard. Pulmonary/Chest: Effort normal. No stridor. No respiratory distress. He has decreased breath sounds (bilateral bases). He has no wheezes. He has no rales.  Abdominal: Soft. He exhibits no distension. There is tenderness (mild) in the left lower quadrant. There is no  rigidity, no rebound and no guarding.  Musculoskeletal: Normal range of motion. He exhibits no edema.       Back:  Neurological: He is alert and oriented to person, place, and time.  Skin: Skin is warm and dry. No rash noted.  Psychiatric: He has a normal mood and affect. His behavior is normal.  Nursing note and vitals reviewed.   ED Course  Procedures (including critical care time) DIAGNOSTIC STUDIES: Oxygen Saturation is 100% on RA, normal by my interpretation.    COORDINATION OF CARE: 6:08 PM Discussed treatment plan with pt which includes CT Abdomen/Pelvis, Chest x-ray and lab work. Pt agreed to plan.   Labs Review Labs Reviewed  URINALYSIS, ROUTINE W REFLEX MICROSCOPIC    Imaging Review Ct Abdomen Pelvis Wo Contrast  03/02/2014   CLINICAL DATA:  LEFT flank pain radiating to the LEFT lower quadrant. Symptoms for 1 week. 8 lb weight loss. Cough. History of prior renal stones.  EXAM: CT ABDOMEN AND PELVIS WITHOUT CONTRAST  TECHNIQUE: Multidetector CT imaging of the abdomen and pelvis was performed following the standard protocol without IV contrast.  COMPARISON:  Chest radiograph today.  Lumbar spine CT 04/09/2010.  FINDINGS: Musculoskeletal: Bilateral hip osteoarthritis is present. Lumbar spondylosis appears similar to prior exam. Unchanged gas-filled synovial cyst in the central canal at L4-L5.  Lung Bases: RIGHT lower lobe airspace disease/consolidation is present. This is most compatible with pneumonia or aspiration. Although this does have spiculated margins, this is probably secondary to the underlying emphysema.  Liver: Unenhanced CT was performed per clinician order. Lack of IV contrast limits sensitivity and specificity, especially for evaluation of abdominal/pelvic solid viscera. Grossly normal.  Spleen:  Normal.  Gallbladder:  Normal.  Common bile duct:  Normal.  Pancreas:  Normal.  Adrenal glands: Bilateral LEFT-greater-than-RIGHT adrenal thickening, probably representing  hyperplasia. No discrete mass lesions. The adrenal glands retained there shape. Adrenal mass is difficult to exclude.  Kidneys: LEFT kidney appears normal. Normal LEFT ureter which can be followed to the urinary bladder. The RIGHT ureter also appears within normal limits. There is a nonobstructing RIGHT inferior pole calculus measuring 6 mm.  Stomach: Collapsed. Mild mural thickening of the proximal stomach is probably due to underdistention. The appearance is nonspecific.  Small bowel:  No small bowel obstruction or inflammatory changes.  Colon: Normal appendix. No colonic obstruction or mass lesion. No inflammatory changes of colon.  Pelvic Genitourinary:  Within normal limits.  Peritoneum: No free fluid.  No free air.  Vasculature: Atherosclerosis.  Body Wall: Within normal limits.  IMPRESSION: 1. RIGHT lower lobe consolidation. This has spiculated margins however there is superimposed emphysema confounding the appearance. This could represent infection or aspiration however a neoplasm cannot be excluded in this patient with emphysema and follow-up is recommended following empiric treatment. Followup in 4-6 weeks to ensure radiographic clearing and exclude an underlying lesion is recommended. Chest CT may be necessary in followup given the superimposed lung disease. 2. No acute abdominal abnormality. 3. 6 mm nonobstructing RIGHT in for pole renal collecting system calculus.   Electronically Signed   By: Dereck Ligas M.D.   On: 03/02/2014  19:11   Dg Chest 2 View  03/02/2014   CLINICAL DATA:  Productive cough for 3 weeks  EXAM: CHEST  2 VIEW  COMPARISON:  Jun 06, 2009  FINDINGS: There is underlying emphysematous change. There is no edema or consolidation. The heart size and pulmonary vascularity are normal. No adenopathy. There is a bony exostosis arising from the inferior aspect of the anterior right first rib. There is degenerative change in the thoracic spine. There is postoperative change in the cervical  spine.  IMPRESSION: Underlying emphysematous change.  No edema or consolidation.   Electronically Signed   By: Lowella Grip M.D.   On: 03/02/2014 19:01  All radiology studies independently viewed by me.      EKG Interpretation None      MDM   Final diagnoses:  Left flank pain  Cough  Community acquired pneumonia    74 yo male with 3 weeks of cough with green phlegm and 1 week of intermittent left low back pain.  Well appearing and nontoxic on exam.  Has left low back tenderness and some LLQ abd tenderness.  Labs show no hematuria, mild leukocytosis, otherwise unremarkable.  CT obtained and shows no ureteral stones, no acute abdominal abnormality, but does show right lower lobe consolidation.  Plan to treat as CAP and have him follow up closely for repeat imaging.  I don't think he needs admission for pneumonia.  Regarding his back pain, I suspect he has msk back pain related to his coughing.  He has no red flag signs or symptoms to suggest emergent spinal pathology.  Specifically, no weakness, numbness,perianal numbness, bladder or bowel incontinence, fevers. Treated with IV morphine with improvement in symptoms.  I personally performed the services described in this documentation, which was scribed in my presence. The recorded information has been reviewed and is accurate.     [name], MD 03/02/14 2026

## 2014-03-06 ENCOUNTER — Ambulatory Visit: Payer: Self-pay | Admitting: Pain Medicine

## 2014-03-06 DIAGNOSIS — M47896 Other spondylosis, lumbar region: Secondary | ICD-10-CM | POA: Diagnosis not present

## 2014-03-06 DIAGNOSIS — M792 Neuralgia and neuritis, unspecified: Secondary | ICD-10-CM | POA: Diagnosis not present

## 2014-03-06 DIAGNOSIS — M19019 Primary osteoarthritis, unspecified shoulder: Secondary | ICD-10-CM | POA: Diagnosis not present

## 2014-03-06 DIAGNOSIS — G90529 Complex regional pain syndrome I of unspecified lower limb: Secondary | ICD-10-CM | POA: Diagnosis not present

## 2014-03-06 DIAGNOSIS — M5416 Radiculopathy, lumbar region: Secondary | ICD-10-CM | POA: Diagnosis not present

## 2014-03-06 DIAGNOSIS — M47817 Spondylosis without myelopathy or radiculopathy, lumbosacral region: Secondary | ICD-10-CM | POA: Diagnosis not present

## 2014-03-06 DIAGNOSIS — M5126 Other intervertebral disc displacement, lumbar region: Secondary | ICD-10-CM | POA: Diagnosis not present

## 2014-04-03 ENCOUNTER — Ambulatory Visit: Payer: Self-pay | Admitting: Pain Medicine

## 2014-04-03 DIAGNOSIS — M4806 Spinal stenosis, lumbar region: Secondary | ICD-10-CM | POA: Diagnosis not present

## 2014-04-03 DIAGNOSIS — M542 Cervicalgia: Secondary | ICD-10-CM | POA: Diagnosis not present

## 2014-04-03 DIAGNOSIS — M47817 Spondylosis without myelopathy or radiculopathy, lumbosacral region: Secondary | ICD-10-CM | POA: Diagnosis not present

## 2014-04-03 DIAGNOSIS — M47896 Other spondylosis, lumbar region: Secondary | ICD-10-CM | POA: Diagnosis not present

## 2014-04-03 DIAGNOSIS — R51 Headache: Secondary | ICD-10-CM | POA: Diagnosis not present

## 2014-04-03 DIAGNOSIS — G90529 Complex regional pain syndrome I of unspecified lower limb: Secondary | ICD-10-CM | POA: Diagnosis not present

## 2014-04-03 DIAGNOSIS — M5416 Radiculopathy, lumbar region: Secondary | ICD-10-CM | POA: Diagnosis not present

## 2014-04-03 DIAGNOSIS — M792 Neuralgia and neuritis, unspecified: Secondary | ICD-10-CM | POA: Diagnosis not present

## 2014-05-01 ENCOUNTER — Ambulatory Visit
Admit: 2014-05-01 | Disposition: A | Discharge status: Home / Self Care | Payer: Self-pay | Attending: Pain Medicine | Admitting: Pain Medicine

## 2014-05-01 DIAGNOSIS — M5416 Radiculopathy, lumbar region: Secondary | ICD-10-CM | POA: Diagnosis not present

## 2014-05-01 DIAGNOSIS — M4806 Spinal stenosis, lumbar region: Secondary | ICD-10-CM | POA: Diagnosis not present

## 2014-05-01 DIAGNOSIS — M542 Cervicalgia: Secondary | ICD-10-CM | POA: Diagnosis not present

## 2014-05-01 DIAGNOSIS — M25561 Pain in right knee: Secondary | ICD-10-CM | POA: Diagnosis not present

## 2014-05-01 DIAGNOSIS — M47896 Other spondylosis, lumbar region: Secondary | ICD-10-CM | POA: Diagnosis not present

## 2014-05-01 DIAGNOSIS — M47817 Spondylosis without myelopathy or radiculopathy, lumbosacral region: Secondary | ICD-10-CM | POA: Diagnosis not present

## 2014-05-01 DIAGNOSIS — M792 Neuralgia and neuritis, unspecified: Secondary | ICD-10-CM | POA: Diagnosis not present

## 2014-05-01 DIAGNOSIS — G90529 Complex regional pain syndrome I of unspecified lower limb: Secondary | ICD-10-CM | POA: Diagnosis not present

## 2014-05-01 DIAGNOSIS — Z9889 Other specified postprocedural states: Secondary | ICD-10-CM | POA: Diagnosis not present

## 2014-05-01 DIAGNOSIS — R209 Unspecified disturbances of skin sensation: Secondary | ICD-10-CM | POA: Diagnosis not present

## 2014-05-20 NOTE — Consult Note (Signed)
PATIENT NAME:  [name] [name], [name] [name] MR#:  [id number] DATE OF BIRTH:  02/19/1940  DATE OF CONSULTATION:  01/03/2012  REFERRING PHYSICIAN:   CONSULTING PHYSICIAN:  Denice Bors. Jacqlyn Larsen, MD  HISTORY: [name] [name] is a 74 year old African American gentleman with a history of nephrolithiasis. He had his first episode of stones in approximately 1985. He had subsequent stones approximately 15 years ago. He underwent ESWL. He has had no significant follow-up in the interval. He did have a CT scan from 2009 which demonstrated multiple bilateral renal calculi with two at least 1 cm in size. He began experiencing left-sided flank pain approximately three months ago. He has had progressive worsening of his pain. He has recently developed some right-sided flank pain. He presented to the Emergency Room for further evaluation. A CT scan was obtained demonstrating a 1 cm proximal right ureteral calculus with obstruction. There is an approximate 9 mm proximal left ureteral calculus with obstruction. Smaller stones are noted within both kidneys. Prominent hydronephrosis is present. His urine demonstrates evidence of infection, although blood cultures are currently negative. He did have an elevated white blood cell count. His serum creatinine on presentation was 2.74 consistent with acute renal failure. Urology consultation was requested for further evaluation and intervention.   PAST MEDICAL HISTORY:  1. Nephrolithiasis, as stated above. 2. Chronic back pain. 3. Chronic neck pain. 4. Osteoarthritis. 5. Degenerative joint disease.   PAST SURGICAL HISTORY:  1. Previous ESWL.  2. Prior knee surgery. 3. Herniorrhaphy x2. 4. Anterior cervical fusion.   SOCIAL HISTORY: Ongoing tobacco use. He denies any alcohol or drug use.   ALLERGIES: No known drug allergies.    MEDICATIONS ON ADMISSION:  1. Topamax 25 mg twice daily. 2. Oxycodone 10 mg every 12 hours.   PHYSICAL EXAMINATION:   VITAL SIGNS: Vital signs stable.    HEENT: Examination is within normal limits.   CHEST: Clear to auscultation bilaterally.   CARDIOVASCULAR: Regular rate and rhythm.   ABDOMEN: Soft, nontender, and nondistended. Moderate left CVA tenderness. Mild right CVA tenderness.   EXTREMITIES: Free range of motion x4.   NEUROLOGIC: Motor and sensory grossly intact.   ASSESSMENT:  1. Bilateral ureteral calculi with obstruction.  2. Bilateral hydronephrosis.  3. Acute renal failure.  4. Bladder infection.   RECOMMENDATION: This patient needs emergent cystoscopy and stent placement. The presence of bilateral obstructing stones will lead to renal failure. With the current bladder infection, he is at high risk for developing sepsis. There is no current evidence of sepsis with negative blood cultures. His white blood cell count has improved with antibiotic therapy. He nonetheless needs emergent intervention for this problem. We have elected to proceed with cystoscopy and bilateral stent placement today. We will also plan on eventual either bilateral ESWL or ureteroscopy with bilateral holmium laser lithotripsy. This will be determined based on      his recovery and treatment desires. He has been consented for the procedure. He is aware of the potential risks and benefits. The option of bilateral nephrostomy tubes were also discussed. He agrees to proceed. ____________________________ Denice Bors Jacqlyn Larsen, MD bsc:slb D: 01/03/2012 11:41:04 ET T: 01/03/2012 12:14:02 ET JOB#: [id number]  cc: Denice Bors. Jacqlyn Larsen, MD, <Dictator> Denice Bors Josclyn Rosales MD ELECTRONICALLY SIGNED 01/05/2012 8:39

## 2014-05-20 NOTE — Op Note (Signed)
PATIENT NAME:  [name] [name], [name] [name] MR#:  [id number] DATE OF BIRTH:  1940/08/13  DATE OF PROCEDURE:  01/17/2012  PREOPERATIVE DIAGNOSIS: Bilateral ureterolithiasis, hydronephrosis.   POSTOPERATIVE DIAGNOSIS: Bilateral ureterolithiasis, hydronephrosis.   PROCEDURES: Bilateral ureteroscopy, left holmium laser lithotripsy, right stone manipulation and bilateral stent placement.   SURGEON: Edrick Oh, M.D.   ANESTHESIA: General endotracheal anesthesia.   INDICATION: The patient is a 74 year old African American gentleman who recently presented with bilateral proximal ureteral calculi, approximately 1 cm each in size. He was also in renal failure. He underwent cystoscopy and stent placement with improvement in renal function. He presents for bilateral ureteroscopy and holmium laser lithotripsy for the bilateral ureteral calculi.   DESCRIPTION OF PROCEDURE: After informed consent was obtained, the patient was taken to the operating room and placed in the dorsal lithotomy position under general endotracheal anesthesia. The patient was then prepped and draped in the usual standard fashion. The 22-French rigid cystoscope was introduced into the urethra under direct vision with several wide bore urethral strictures noted. Upon entering the prostate only moderate bilobar hypertrophy was noted with partial visual obstruction. Upon entering the bladder, the mucosa was inspected in its entirety. Moderate debris was present and stents were noted protruding from both ureteral orifices. Minimal inflammation was noted on the bladder base and around the ureteral orifices. The right stent was initially grasped. It was removed utilizing grasping forceps without difficulty. A guidewire was advanced into the right ureter. Some tortuosity of the proximal ureter was identified. Several manipulations of the guidewire were required for passage into the kidney. The stone that was previously in the proximal ureter was now noted to  be in a lateral inferior pole portion of the right kidney. The cystoscope was removed. The 6 French rigid ureteroscope was advanced into the urinary bladder. It was advanced into the right ureteral orifice and into the proximal ureter. At the level of the tortuosity, the scope was not able to be easily passed. A second guidewire was utilized to help facilitate passage. As the scope was advanced into the renal pelvis, the upper and some of the upper lateral calyces were easily identified. Prominent hydronephrosis of the kidney and renal pelvis was appreciated. The stone however could not be visualized. The decision was made to proceed with flexible ureteroscopy. While the equipment was being prepared, the left stent was similarly removed utilizing grasping forceps. The guidewire was advanced into the left ureteral orifice and into the left upper pole collecting system under fluoroscopic guidance without difficulty. The 6 French rigid ureteroscope was advanced into the urinary bladder. A second guidewire was utilized to help facilitate passage into the kidney. Some tortuosity was also noted of the left ureter, but less than the right. As the scope approached the stone in the ureter, it pushed back into the kidney and landed in a lateral calyx. This was due to the irrigation that resulted in the efflux of the stone back into the kidney. The stone could not be visualized in the kidney. Prominent hydronephrosis and enlargement of the renal pelvis was also noted. A moderate amount of debris was present; some of this was irrigated free. The guidewire was advanced back through the ureteroscope. The ureteroscope was then removed. The sheath was advanced over the guidewire under fluoroscopic guidance without difficulty. The obturator was removed from the sheath. The flexible ureteroscope was advanced over the guidewire into the renal pelvis. The stone was identified in a lower lateral calyx. It was grasped utilizing a basket  and withdrawn back into the proximal ureter. The basket was taken apart and left engaged on the stone. The scope was readvanced alongside of the basket. The holmium laser fiber was then utilized to fragment the stone into multiple smaller pieces. Two pieces of the stone flushed back into the kidney. The ureteral fragments were basket extracted and were collected and will be sent for stone analysis. The flexible scope was readvanced into the bladder, after replacement of the sheath. A larger fragment in the lateral lower calyx was grasped and brought back into the proximal ureter. It was fragmented into several smaller pieces. These were then basket extracted. Multiple attempts were made at basketing the remaining stone within the lateral lower calyx. Due to angulation and size of the fragment this was not able to be accomplished. It was less than 4 mm in size. This should be able to pass spontaneously. The decision was made not to proceed with any further attempts. The sheath was then advanced over the guidewire remaining in the right ureter. It was advanced easily into the mid ureter. The flexible scope was advanced over the guidewire into the renal pelvis. Initial visualization of the stone was difficult due to a moderate amount of debris so this was irrigated free. Once the stone was visualized, it was able to be grasped utilizing the basket. It was withdrawn to the level of the ureter. Multiple attempts were made at passing the rigid scope. After disengaging the basket to the level of the stone, due to the tortuosity of the ureter, this was unable to be accomplished even with an accessory guidewire for aid in manipulation. Multiple attempts were made without success. The stone fell free from the basket. The sheath was replaced. The flexible scope was replaced back into the renal pelvis, on the right. The stone was engaged a second time and was brought back to the ureter. Multiple attempts were once again made to  visualize the stone adequately for fragmentation. This also was unsuccessful. The stone once again fell back into the renal pelvis. A third attempt was made, also with the same result. The decision was made not to make any further attempts. The stent was replaced into the right ureter, back-loaded over the cystoscope. A 6 French x 26 cm double-J ureteral stent was easily advanced into the right kidney. Adequate curl was noted within the renal pelvis. Adequate curl was also noted within the urinary bladder. The guidewire remaining in the left kidney was back loaded through the scope. A 6 French x 26 cm double-J ureteral stent was advanced over the guidewire into the renal pelvis. Adequate curl was noted within the renal pelvis. Adequate curl was also noted in the urinary bladder. The remaining fragments within the bladder were irrigated free. These were collected and will be also sent for stone analysis. The bladder was then drained, the cystoscope was removed and the patient was returned to the supine position and awakened from general endotracheal anesthesia. He was taken to the recovery room in stable condition. There were no problems or complications. The patient tolerated the procedure well. He will be scheduled for an additional approach for the remaining stone, on the right. ____________________________ Denice Bors. Jacqlyn Larsen, MD bsc:sb D: 01/17/2012 13:04:16 ET T: 01/18/2012 07:47:13 ET JOB#: [id number]  cc: Denice Bors. Jacqlyn Larsen, MD, <Dictator> Denice Bors Shaquella Stamant MD ELECTRONICALLY SIGNED 01/18/2012 22:45

## 2014-05-20 NOTE — Consult Note (Signed)
Pt doing well today. Some stent discomfort as expectedimproving, Hgb down some.UOP, slight dysuria from stentsradiculopathy L>R for some time, will need to be addressed outpatientdiscussed for stone treatment as well as risks and benefitsplan on proceeding with Bilateral Ureteroscopy with holmium laser lithotripsy tuesday following.have office arrange pre-op.Cx now with growth.   Electronic Signatures: Murrell Redden (MD)  (Signed on 04-Dec-13 08:01)  Authored  Last Updated: 04-Dec-13 08:01 by Murrell Redden (MD)

## 2014-05-20 NOTE — Discharge Summary (Signed)
PATIENT NAME:  [name], [name] MR#:  [id number] DATE OF BIRTH:  1940-07-05  DATE OF ADMISSION:  01/02/2012 DATE OF DISCHARGE:  01/05/2012  ADMITTING DIAGNOSES:  1. Pyelonephritis. 2. Acute kidney injury.   DISCHARGE DIAGNOSES:  1. Acute renal failure.  2. Bilateral hydronephrosis status post bilateral ureteral stent placement as well as bladder stone removal by Dr. Jacqlyn Larsen on 01/03/2012.  3. Pyuria but no urinary tract infection.  4. Bacteremia, contamination likely.  5. Hyponatremia, dehydration likely, resolved.  6. Hyperglycemia with hemoglobin A1c 5.3.  7. History of chronic back pain, chronic neck pain as well as osteoarthritis and knee pain.   DISCHARGE CONDITION: Fair.   DISCHARGE MEDICATIONS: The patient is to resume his outpatient medications which are: 1. Oxycodone 10 mg p.o. twice daily.  2. Topiramate 25 mg p.o. twice daily.  3. Keflex, new medication, 500 mg 3 times daily for five more days.   HOME OXYGEN: None.   DIET: Regular, regular consistency.   ACTIVITY LIMITATIONS: As tolerated.   FOLLOW-UP:  1. Follow-up with Dr. Rutherford Nail in two days after discharge.  2. Follow-up with Dr. Jacqlyn Larsen in one week after discharge.   CONSULTANT: Dr. Jacqlyn Larsen    RADIOLOGIC STUDIES:  1. Chest x-ray, 01/02/2012, PA and lateral, COPD and bibasilar pneumonia according to radiologist.  2. CT scan of abdomen and pelvis, 01/02/2012, revealed 8 mm stones in proximal portions of both ureters with moderate bilateral hydronephrosis. Basilar atelectasis and/or pneumonia.  3. Ultrasound of kidneys, 01/02/2012, revealed mild to moderate bilateral hydronephrosis. Debris appears to be present in the bladder. Cystitis could present in this fashion. Bladder is nondistended.   REASON FOR ADMISSION: The patient is a 74 year old African American male with history of back pains who presented to the hospital with complaints of left flank pain as well as dysuria. Please refer to Dr. Olene Craven admission note  on 01/02/2012.  PHYSICAL EXAMINATION: On arrival to the hospital, the patient's temperature was 98.1, pulse 77, respiration rate 18, blood pressure 146/77, saturation was 95% on room air. The patient's lungs were clear. His abdominal exam was unremarkable, however, the patient had some tenderness to palpation in the costovertebral angle on the left side. Otherwise, no changes were noted.   LAB DATA: Labs done on 01/02/2012 showed elevated glucose to 112, BUN and creatinine were 37 and 2.74. Sodium level was 129 level. Bicarbonate level was low at 20. Estimated GFR for African American would be 26. White blood cell count was elevated to 11.5, hemoglobin 14.2, platelet count 203. Blood cultures x2 were taken and one of them revealed coagulase negative Staphylococcus in aerobic as well as anaerobic bottles, two different species of coagulase-negative Staphylococcus noted. The second one showed coagulase negative Staphylococcus in aerobic bottle only. However, that was also different than other set. Urine culture was negative. Minimal organisms were noted, the results suggestive of contamination.   HOSPITAL COURSE: The patient was admitted to the hospital. He underwent ultrasound as well as CT scan of his abdomen and consultation with Dr. Jacqlyn Larsen was obtained. Dr. Jacqlyn Larsen saw the patient in consultation on 01/03/2012. He took the patient to the operating room and the patient underwent cystoscopy as well as bilateral stent placement due to bilateral ureterolithiasis and hydronephrosis and acute renal failure. Also, an 8 mm bladder stone was removed. After procedure with IV fluid administration as well as IV antibiotics, the patient's kidney function normalized. On 01/05/2012, the patient's BUN and creatinine were 18 and 1.26 respectively signifying the patient's estimated GFR for  African American more than 60. It was felt that the patient is stable to be discharged home. He was recommended to continue antibiotic therapy  for five more days to complete seven-day course with Keflex. He is to follow-up with Dr. Rutherford Nail as well as Dr. Jacqlyn Larsen in the next one week after discharge.   As mentioned above, the patient had pyuria, however, no urinary tract infection. He had also bacteremia which looked like contamination. He was managed with antibiotics, however, all antibiotics except Keflex were stopped upon discharge.   The patient was hyponatremic as well as dehydrated on arrival to the hospital. With IV fluid administration, the patient's kidney function as well as his sodium level normalized.   In regards to back pains, the patient was given prescription for 40 pills of oxycodone as well as 60 pills of topiramate upon discharge. He is to follow-up with Dr. Rutherford Nail as well as Dr. Primus Bravo as outpatient to make decisions about pain management as outpatient.   In regards to hyperglycemia, the patient's hemoglobin A1c was checked and was found 5.3. With rehydration, the patient's hemoglobin level declined and by the day of discharge the patient's hemoglobin level was 10.8. No bleeding was noted and it was felt that the patient's low hemoglobin level very likely is chronic and possibly hydration related.   The patient is being discharged in stable condition with the above-mentioned medications and follow-up.   VITAL SIGNS: Temperature 97.9, pulse 65, respiration rate 20, blood pressure 120/50, saturation was 96% on room air at rest.   TIME SPENT: 40 minutes.   ____________________________ Theodoro Grist, MD rv:drc D: 01/05/2012 15:18:14 ET T: 01/06/2012 08:56:04 ET JOB#: [id number]  cc: Theodoro Grist, MD, <Dictator> Ashok Norris, MD Denice Bors. Jacqlyn Larsen, MD Theodoro Grist MD ELECTRONICALLY SIGNED 01/14/2012 16:15

## 2014-05-20 NOTE — Consult Note (Signed)
Pt seen, chart reviewed, films reviewed, note dictated,  Bilateral Ureteral Calculi with obstruction, Bilateral Hydronephrosis, Acute Renal Failure, Bladder Infection  This patient needs emergent cystoscopy, with bilateral stent placement.  Bilateral renal obstruction needs urgent intervention. There is no current evidence of sepsis but patient at high risk.  Situation discussed with patient.  Will proceed with emergent cystoscopy, bilateral stent placement. Pt consented.  Pt understands risks and benefits. Agrees to proceed. Will need eventual ureteroscopy with holmium or ESWl for treatment of the stones once recovered and the obstruction is addressed.  Electronic Signatures: Murrell Redden (MD)  (Signed on 03-Dec-13 11:31)  Authored  Last Updated: 03-Dec-13 11:31 by Murrell Redden (MD)

## 2014-05-20 NOTE — Op Note (Signed)
PATIENT NAME:  [name] [name], [name] [name] MR#:  [id number] DATE OF BIRTH:  January 29, 1941  DATE OF PROCEDURE:  01/03/2012  PREOPERATIVE DIAGNOSES:  1. Bilateral ureterolithiasis. 2. Bilateral hydronephrosis.  3. Acute renal failure.   POSTOPERATIVE DIAGNOSES:  1. Bilateral ureterolithiasis. 2. Bilateral hydronephrosis.  3. Acute renal failure. 4. Bladder calculus.   PROCEDURES:  1. Cystoscopy.  2. Bilateral double-J ureteral stent placement.  3. Bladder calculus removal.  SURGEON: Edrick Oh, MD  ANESTHESIA: General endotracheal anesthesia.   INDICATION: The patient is a 74 year old African American gentleman with a history of nephrolithiasis. He developed the onset of left-sided flank pain approximately three months ago. He has known bilateral renal calculi. He had progressive worsening of discomfort and overall general health. He presented to the Emergency Room where a CT scan demonstrated bilateral proximal ureteral calculi, approximately 9 mm on the left and 10 mm on the right. There was prominent bilateral hydronephrosis. He was also found to be in acute renal failure with a creatinine of 2.7 with mild elevation of white blood cell count. His urine demonstrated findings consistent with a bladder infection. Based on the bilateral ureteral obstruction with hydronephrosis, renal failure, and bladder infection he has been scheduled for cystoscopy and bilateral double-J ureteral stent placement on an emergent basis. He presents for this purpose.   DESCRIPTION OF PROCEDURE: After informed consent was obtained, the patient was taken to the Operating Room and placed in the dorsal lithotomy position under general endotracheal anesthesia. The patient was then prepped and draped in the usual standard fashion. The previous indwelling Foley catheter was removed prior to prepping. The 16 French rigid cystoscope was introduced into the urethra under direct vision with no urethral abnormalities noted. Upon  entering the prostatic fossa, moderate bilobar hypertrophy was noted with only partial visual obstruction. Upon entering the bladder, the mucosa was inspected in its entirety. There was an approximate 8 mm stone on the bladder base. Bilateral ureteral orifices were well visualized. An initial attempt at passing a guidewire into the left ureteral orifice was unsuccessful due to some kinking in the distal ureter, approximately 2 cm beyond the orifice. The guidewire was then placed into the right ureteral orifice. It was advanced to the level of the stone. Resistance was met with inability to pass the guidewire past the stone. The decision was made to try and proceed with ureteroscopic guidewire placement. During the time waiting for the scope, continued manipulation was successful in getting the guidewire past the stone into the upper pole collecting system. A 7 French x 26 cm double-J ureteral stent was advanced over the guidewire into the mid pole collecting system. Adequate curl was noted within the renal pelvis. Adequate curl was also noted within the urinary bladder. The guidewire was then advanced back into the left ureteral orifice. With some manipulation, it was able to be advanced to the level of the stone. Once again resistance was met. With continued manipulation, the guidewire was able to be successfully navigated beyond the stone into the upper pole collecting system. A second 7 French x 26 cm double-J ureteral stent was advanced through the scope over the guidewire into the upper pole collecting system. Adequate curl was noted within the upper collecting system. Upon withdrawal of the guidewire, adequate curl was also noted within the urinary bladder. A large amount of purulent dark debris was noted extruding from the stent after placement consistent with significant obstruction of likely longstanding duration. The bladder was drained. The bladder was then refilled with  saline to help with visualization.  Grasping forceps were utilized to remove the stone. Due to the stone size, it was not able to be pulled through the scope. It was able to be pulled far enough into the scope that the scope could be removed with the stone at its opening. The stone was able to be collected through this means. The scope was replaced back into the urinary bladder. The bladder was drained. The cystoscope was removed. The patient was returned to the supine position and awakened from general endotracheal anesthesia. He was taken to the recovery room in stable condition. There were no problems or complications. The patient tolerated the procedure well. ____________________________ Denice Bors. Jacqlyn Larsen, MD bsc:slb D: 01/03/2012 13:44:24 ET     T: 01/03/2012 13:54:35 ET       JOB#: [id number] cc: Denice Bors. Jacqlyn Larsen, MD, <Dictator> Denice Bors Yolani Vo MD ELECTRONICALLY SIGNED 01/05/2012 8:39
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
## 2014-05-28 ENCOUNTER — Ambulatory Visit
Admit: 2014-05-28 | Disposition: A | Discharge status: Home / Self Care | Payer: Self-pay | Attending: Pain Medicine | Admitting: Pain Medicine

## 2014-05-28 DIAGNOSIS — G90529 Complex regional pain syndrome I of unspecified lower limb: Secondary | ICD-10-CM | POA: Diagnosis not present

## 2014-05-28 DIAGNOSIS — M5416 Radiculopathy, lumbar region: Secondary | ICD-10-CM | POA: Diagnosis not present

## 2014-05-28 DIAGNOSIS — M792 Neuralgia and neuritis, unspecified: Secondary | ICD-10-CM | POA: Diagnosis not present

## 2014-05-28 DIAGNOSIS — M25512 Pain in left shoulder: Secondary | ICD-10-CM | POA: Diagnosis not present

## 2014-05-28 DIAGNOSIS — M545 Low back pain: Secondary | ICD-10-CM | POA: Diagnosis not present

## 2014-05-28 DIAGNOSIS — M47817 Spondylosis without myelopathy or radiculopathy, lumbosacral region: Secondary | ICD-10-CM | POA: Diagnosis not present

## 2014-05-28 DIAGNOSIS — M25561 Pain in right knee: Secondary | ICD-10-CM | POA: Diagnosis not present

## 2014-06-03 ENCOUNTER — Encounter: Payer: Commercial Managed Care - HMO | Admitting: Pain Medicine

## 2014-06-09 ENCOUNTER — Ambulatory Visit: Payer: Commercial Managed Care - HMO | Admitting: Pain Medicine

## 2014-07-07 ENCOUNTER — Ambulatory Visit: Payer: Commercial Managed Care - HMO | Attending: Pain Medicine | Admitting: Pain Medicine

## 2014-07-07 ENCOUNTER — Encounter: Payer: Self-pay | Admitting: Pain Medicine

## 2014-07-07 VITALS — BP 128/80 | HR 59 | Temp 98.1°F | Resp 16 | Ht 68.0 in | Wt 160.0 lb

## 2014-07-07 DIAGNOSIS — Z79891 Long term (current) use of opiate analgesic: Secondary | ICD-10-CM | POA: Diagnosis not present

## 2014-07-07 DIAGNOSIS — M5416 Radiculopathy, lumbar region: Secondary | ICD-10-CM | POA: Diagnosis not present

## 2014-07-07 DIAGNOSIS — M4806 Spinal stenosis, lumbar region: Secondary | ICD-10-CM | POA: Diagnosis not present

## 2014-07-07 DIAGNOSIS — Z9889 Other specified postprocedural states: Secondary | ICD-10-CM | POA: Diagnosis not present

## 2014-07-07 DIAGNOSIS — M5030 Other cervical disc degeneration, unspecified cervical region: Secondary | ICD-10-CM | POA: Diagnosis not present

## 2014-07-07 DIAGNOSIS — M5136 Other intervertebral disc degeneration, lumbar region: Secondary | ICD-10-CM | POA: Insufficient documentation

## 2014-07-07 DIAGNOSIS — G90521 Complex regional pain syndrome I of right lower limb: Secondary | ICD-10-CM | POA: Insufficient documentation

## 2014-07-07 DIAGNOSIS — M47816 Spondylosis without myelopathy or radiculopathy, lumbar region: Secondary | ICD-10-CM | POA: Insufficient documentation

## 2014-07-07 DIAGNOSIS — F1120 Opioid dependence, uncomplicated: Secondary | ICD-10-CM | POA: Diagnosis not present

## 2014-07-07 DIAGNOSIS — M48062 Spinal stenosis, lumbar region with neurogenic claudication: Secondary | ICD-10-CM

## 2014-07-07 DIAGNOSIS — Z0389 Encounter for observation for other suspected diseases and conditions ruled out: Secondary | ICD-10-CM | POA: Diagnosis not present

## 2014-07-07 DIAGNOSIS — M5481 Occipital neuralgia: Secondary | ICD-10-CM | POA: Insufficient documentation

## 2014-07-07 DIAGNOSIS — M792 Neuralgia and neuritis, unspecified: Secondary | ICD-10-CM | POA: Diagnosis not present

## 2014-07-07 DIAGNOSIS — Z79899 Other long term (current) drug therapy: Secondary | ICD-10-CM | POA: Diagnosis not present

## 2014-07-07 DIAGNOSIS — M545 Low back pain: Secondary | ICD-10-CM | POA: Diagnosis present

## 2014-07-07 DIAGNOSIS — G8929 Other chronic pain: Secondary | ICD-10-CM | POA: Diagnosis not present

## 2014-07-07 DIAGNOSIS — Z5181 Encounter for therapeutic drug level monitoring: Secondary | ICD-10-CM | POA: Diagnosis not present

## 2014-07-07 DIAGNOSIS — M179 Osteoarthritis of knee, unspecified: Secondary | ICD-10-CM | POA: Diagnosis not present

## 2014-07-07 DIAGNOSIS — G90529 Complex regional pain syndrome I of unspecified lower limb: Secondary | ICD-10-CM | POA: Diagnosis not present

## 2014-07-07 DIAGNOSIS — M170 Bilateral primary osteoarthritis of knee: Secondary | ICD-10-CM

## 2014-07-07 DIAGNOSIS — M47817 Spondylosis without myelopathy or radiculopathy, lumbosacral region: Secondary | ICD-10-CM | POA: Diagnosis not present

## 2014-07-07 DIAGNOSIS — M1710 Unilateral primary osteoarthritis, unspecified knee: Secondary | ICD-10-CM | POA: Insufficient documentation

## 2014-07-07 HISTORY — DX: Spinal stenosis, lumbar region with neurogenic claudication: M48.062

## 2014-07-07 MED ORDER — OXYCODONE HCL 10 MG PO TABS: ORAL_TABLET | ORAL | Status: DC

## 2014-07-07 NOTE — Progress Notes (Signed)
Safety precautions to be maintained throughout the outpatient stay will include: orient to surroundings, keep bed in low position, maintain call bell within reach at all times, provide assistance with transfer out of bed and ambulation.  Discharge instructions given.Pt ambulatory 1129 hrs. Script on oxycodone given

## 2014-07-07 NOTE — Patient Instructions (Addendum)
Continue present medications.  Lumbar epidural steroid injection Wednesday, 07/23/2014  F/U PCP for evaliation of  BP and general medical  condition.  F/U surgical evaluation.  F/U neurological evaluation.  May consider radiofrequency rhizolysis or intraspinal procedures pending response to present treatment and F/U evaluation.  Patient to call [facility] should patient have concerns prior to scheduled return appointment. GENERAL RISKS AND COMPLICATIONS  What are the risk, side effects and possible complications? Generally speaking, most procedures are safe.  However, with any procedure there are risks, side effects, and the possibility of complications.  The risks and complications are dependent upon the sites that are lesioned, or the type of nerve block to be performed.  The closer the procedure is to the spine, the more serious the risks are.  Great care is taken when placing the radio frequency needles, block needles or lesioning probes, but sometimes complications can occur. 1. Infection: Any time there is an injection through the skin, there is a risk of infection.  This is why sterile conditions are used for these blocks.  There are four possible types of infection. 1. Localized skin infection. 2. Central Nervous System Infection-This can be in the form of Meningitis, which can be deadly. 3. Epidural Infections-This can be in the form of an epidural abscess, which can cause pressure inside of the spine, causing compression of the spinal cord with subsequent paralysis. This would require an emergency surgery to decompress, and there are no guarantees that the patient would recover from the paralysis. 4. Discitis-This is an infection of the intervertebral discs.  It occurs in about 1% of discography procedures.  It is difficult to treat and it may lead to surgery.        2. Pain: the needles have to go through skin and soft tissues, will cause soreness.       3. Damage to  internal structures:  The nerves to be lesioned may be near blood vessels or    other nerves which can be potentially damaged.       4. Bleeding: Bleeding is more common if the patient is taking blood thinners such as  aspirin, Coumadin, Ticiid, Plavix, etc., or if he/she have some genetic predisposition  such as hemophilia. Bleeding into the spinal canal can cause compression of the spinal  cord with subsequent paralysis.  This would require an emergency surgery to  decompress and there are no guarantees that the patient would recover from the  paralysis.       5. Pneumothorax:  Puncturing of a lung is a possibility, every time a needle is introduced in  the area of the chest or upper back.  Pneumothorax refers to free air around the  collapsed lung(s), inside of the thoracic cavity (chest cavity).  Another two possible  complications related to a similar event would include: Hemothorax and Chylothorax.   These are variations of the Pneumothorax, where instead of air around the collapsed  lung(s), you may have blood or chyle, respectively.       6. Spinal headaches: They may occur with any procedures in the area of the spine.       7. Persistent CSF (Cerebro-Spinal Fluid) leakage: This is a rare problem, but may occur  with prolonged intrathecal or epidural catheters either due to the formation of a fistulous  track or a dural tear.       8. Nerve damage: By working so close to the spinal cord, there is always a possibility  of  nerve damage, which could be as serious as a permanent spinal cord injury with  paralysis.       9. Death:  Although rare, severe deadly allergic reactions known as "Anaphylactic  reaction" can occur to any of the medications used.      10. Worsening of the symptoms:  We can always make thing worse.  What are the chances of something like this happening? Chances of any of this occuring are extremely low.  By statistics, you have more of a chance of getting killed in a motor  vehicle accident: while driving to the hospital than any of the above occurring .  Nevertheless, you should be aware that they are possibilities.  In general, it is similar to taking a shower.  Everybody knows that you can slip, hit your head and get killed.  Does that mean that you should not shower again?  Nevertheless always keep in mind that statistics do not mean anything if you happen to be on the wrong side of them.  Even if a procedure has a 1 (one) in a 1,000,000 (million) chance of going wrong, it you happen to be that one..Also, keep in mind that by statistics, you have more of a chance of having something go wrong when taking medications.  Who should not have this procedure? If you are on a blood thinning medication (e.g. Coumadin, Plavix, see list of "Blood Thinners"), or if you have an active infection going on, you should not have the procedure.  If you are taking any blood thinners, please inform your physician.  How should I prepare for this procedure?  Do not eat or drink anything at least six hours prior to the procedure.  Bring a driver with you .  It cannot be a taxi.  Come accompanied by an adult that can drive you back, and that is strong enough to help you if your legs get weak or numb from the local anesthetic.  Take all of your medicines the morning of the procedure with just enough water to swallow them.  If you have diabetes, make sure that you are scheduled to have your procedure done first thing in the morning, whenever possible.  If you have diabetes, take only half of your insulin dose and notify our nurse that you have done so as soon as you arrive at the clinic.  If you are diabetic, but only take blood sugar pills (oral hypoglycemic), then do not take them on the morning of your procedure.  You may take them after you have had the procedure.  Do not take aspirin or any aspirin-containing medications, at least eleven (11) days prior to the procedure.  They may  prolong bleeding.  Wear loose fitting clothing that may be easy to take off and that you would not mind if it got stained with Betadine or blood.  Do not wear any jewelry or perfume  Remove any nail coloring.  It will interfere with some of our monitoring equipment.  NOTE: Remember that this is not meant to be interpreted as a complete list of all possible complications.  Unforeseen problems may occur.  BLOOD THINNERS The following drugs contain aspirin or other products, which can cause increased bleeding during surgery and should not be taken for 2 weeks prior to and 1 week after surgery.  If you should need take something for relief of minor pain, you may take acetaminophen which is found in Tylenol,m Datril, Anacin-3 and Panadol. It is  not blood thinner. The products listed below are.  Do not take any of the products listed below in addition to any listed on your instruction sheet.  A.P.C or A.P.C with Codeine Codeine Phosphate Capsules #3 Ibuprofen Ridaura  ABC compound Congesprin Imuran rimadil  Advil Cope Indocin Robaxisal  Alka-Seltzer Effervescent Pain Reliever and Antacid Coricidin or Coricidin-D  Indomethacin Rufen  Alka-Seltzer plus Cold Medicine Cosprin Ketoprofen S-A-C Tablets  Anacin Analgesic Tablets or Capsules Coumadin Korlgesic Salflex  Anacin Extra Strength Analgesic tablets or capsules CP-2 Tablets Lanoril Salicylate  Anaprox Cuprimine Capsules Levenox Salocol  Anexsia-D Dalteparin Magan Salsalate  Anodynos Darvon compound Magnesium Salicylate Sine-off  Ansaid Dasin Capsules Magsal Sodium Salicylate  Anturane Depen Capsules Marnal Soma  APF Arthritis pain formula Dewitt's Pills Measurin Stanback  Argesic Dia-Gesic Meclofenamic Sulfinpyrazone  Arthritis Bayer Timed Release Aspirin Diclofenac Meclomen Sulindac  Arthritis pain formula Anacin Dicumarol Medipren Supac  Analgesic (Safety coated) Arthralgen Diffunasal Mefanamic Suprofen  Arthritis Strength Bufferin  Dihydrocodeine Mepro Compound Suprol  Arthropan liquid Dopirydamole Methcarbomol with Aspirin Synalgos  ASA tablets/Enseals Disalcid Micrainin Tagament  Ascriptin Doan's Midol Talwin  Ascriptin A/D Dolene Mobidin Tanderil  Ascriptin Extra Strength Dolobid Moblgesic Ticlid  Ascriptin with Codeine Doloprin or Doloprin with Codeine Momentum Tolectin  Asperbuf Duoprin Mono-gesic Trendar  Aspergum Duradyne Motrin or Motrin IB Triminicin  Aspirin plain, buffered or enteric coated Durasal Myochrisine Trigesic  Aspirin Suppositories Easprin Nalfon Trillsate  Aspirin with Codeine Ecotrin Regular or Extra Strength Naprosyn Uracel  Atromid-S Efficin Naproxen Ursinus  Auranofin Capsules Elmiron Neocylate Vanquish  Axotal Emagrin Norgesic Verin  Azathioprine Empirin or Empirin with Codeine Normiflo Vitamin E  Azolid Emprazil Nuprin Voltaren  Bayer Aspirin plain, buffered or children's or timed BC Tablets or powders Encaprin Orgaran Warfarin Sodium  Buff-a-Comp Enoxaparin Orudis Zorpin  Buff-a-Comp with Codeine Equegesic Os-Cal-Gesic   Buffaprin Excedrin plain, buffered or Extra Strength Oxalid   Bufferin Arthritis Strength Feldene Oxphenbutazone   Bufferin plain or Extra Strength Feldene Capsules Oxycodone with Aspirin   Bufferin with Codeine Fenoprofen Fenoprofen Pabalate or Pabalate-SF   Buffets II Flogesic Panagesic   Buffinol plain or Extra Strength Florinal or Florinal with Codeine Panwarfarin   Buf-Tabs Flurbiprofen Penicillamine   Butalbital Compound Four-way cold tablets Penicillin   Butazolidin Fragmin Pepto-Bismol   Carbenicillin Geminisyn Percodan   Carna Arthritis Reliever Geopen Persantine   Carprofen Gold's salt Persistin   Chloramphenicol Goody's Phenylbutazone   Chloromycetin Haltrain Piroxlcam   Clmetidine heparin Plaquenil   Cllnoril Hyco-pap Ponstel   Clofibrate Hydroxy chloroquine Propoxyphen         Before stopping any of these medications, be sure to consult the  physician who ordered them.  Some, such as Coumadin (Warfarin) are ordered to prevent or treat serious conditions such as "deep thrombosis", "pumonary embolisms", and other heart problems.  The amount of time that you may need off of the medication may also vary with the medication and the reason for which you were taking it.  If you are taking any of these medications, please make sure you notify your pain physician before you undergo any procedures.         Epidural Steroid Injection An epidural steroid injection is given to relieve pain in your neck, back, or legs that is caused by the irritation or swelling of a nerve root. This procedure involves injecting a steroid and numbing medicine (anesthetic) into the epidural space. The epidural space is the space between the outer covering of your spinal cord and the  bones that form your backbone (vertebra).  LET [facility] CARE PROVIDER KNOW ABOUT:  2. Any allergies you have. 3. All medicines you are taking, including vitamins, herbs, eye drops, creams, and over-the-counter medicines such as aspirin. 4. Previous problems you or members of your family have had with the use of anesthetics. 5. Any blood disorders or blood clotting disorders you have. 6. Previous surgeries you have had. 7. Medical conditions you have. RISKS AND COMPLICATIONS Generally, this is a safe procedure. However, as with any procedure, complications can occur. Possible complications of epidural steroid injection include:  Headache.  Bleeding.  Infection.  Allergic reaction to the medicines.  Damage to your nerves. The response to this procedure depends on the underlying cause of the pain and its duration. People who have long-term (chronic) pain are less likely to benefit from epidural steroids than are those people whose pain comes on strong and suddenly. BEFORE THE PROCEDURE   Ask your health care provider about changing or stopping your regular medicines. You  may be advised to stop taking blood-thinning medicines a few days before the procedure.  You may be given medicines to reduce anxiety.  Arrange for someone to take you home after the procedure. PROCEDURE   You will remain awake during the procedure. You may receive medicine to make you relaxed.  You will be asked to lie on your stomach.  The injection site will be cleaned.  The injection site will be numbed with a medicine (local anesthetic).  A needle will be injected through your skin into the epidural space.  Your health care provider will use an X-ray machine to ensure that the steroid is delivered closest to the affected nerve. You may have minimal discomfort at this time.  Once the needle is in the right position, the local anesthetic and the steroid will be injected into the epidural space.  The needle will then be removed and a bandage will be applied to the injection site. AFTER THE PROCEDURE  12. You may be monitored for a short time before you go home. 13. You may feel weakness or numbness in your arm or leg, which disappears within hours. 14. You may be allowed to eat, drink, and take your regular medicine. 15. You may have soreness at the site of the injection. Document Released: 04/26/2007 Document Revised: 09/19/2012 Document Reviewed: 07/06/2012 [facility] Patient Information 2015 [place], [place]. This information is not intended to replace advice given to you by your health care provider. Make sure you discuss any questions you have with your health care provider.

## 2014-07-07 NOTE — Progress Notes (Signed)
   Subjective:    Patient ID: [name] [name], male    DOB: 1941/01/01, 74 y.o.   MRN: [id number]  HPI Patient is 74 year old gentleman [facility] further evaluation and treatment of pain involving the lower back lower extremity region. Patient states that he has had progression of pain of the lower back radiating to both the left and right lower extremities associated with weakness with prolonged standing and walking. The patient is in hopes of being able to undergo interventional treatment at time return appointment in attempt to decrease severity of symptoms, minimize risk of medication escalation, hopefully retard progression of patient's symptoms, and avoid the need for more involved treatment we discussed patient's condition patient tolerating medications well we will also discussed surgical evaluation of patient and we'll consider patient for lumbar epidural steroid injection to be performed at time return appointment as discussed and explained to patient on today's visit was understanding and agreement with suggested treatment plan.   Review of Systems     Objective:   Physical Exam  There was tenderness over the splenius capitis and occipitalis musculature regions of moderate degree. No new lesions of the head and neck were noted. Patient appeared to be with bilaterally equal grip strength and Tinel and Phalen's maneuver were without increased pain of significant degree. Tinnitus of the thoracic region was noted with tends to palpation over the upper mid and lower thoracic paraspinal musculature region of mild to moderate degree. Palpation of the lumbar paraspinal muscle lumbar facet region reproduced Dr. portion of patient's pain. Lateral bending rotation associated with moderately severe discomfort. There was tenderness over the PSIS and PII S regions of moderate degree. Straight leg raising tolerates approximately 20 without increased pain with dorsiflexion  noted. No definite sensory deficit of dermatomal distribution was detected. There was tends to palpation of the knees with negative anterior and posterior drawer signs without ballottement of the patella. There was crepitus of the knees especially on the right noted. Decreased and there was negative clonus negative Homans. Lateral bending and rotation extension and palpation of the lumbar facets reproduce moderately severe discomfort. Abdomen nontender and no costovertebral tenderness noted.      Assessment & Plan:  Degenerative disc disease lumbar spine L3-4 and L4-5 degenerative changes with spinal canal stenosis Lumbar stenosis with neurogenic claudication Lumbar facet syndrome  Status post surgery of right knee following trauma Degenerative joint disease of knee Complex regional pain syndrome of knee and lower extremity on the right  Degenerative disc disease cervical spine  Greater occipital neuralgia    Plan   Continue present medications   Lumbar epidural steroid injection to be performed at time of return appointment  F/U PCP for evaliation of  BP and general medical  condition.  F/U surgical evaluation.  F/U neurological evaluation.  May consider radiofrequency rhizolysis or intraspinal procedures pending response to present treatment and F/U evaluation.  Patient to call [facility] should patient have concerns prior to scheduled return appointment.

## 2014-07-17 ENCOUNTER — Other Ambulatory Visit: Payer: Self-pay | Admitting: Pain Medicine

## 2014-07-20 ENCOUNTER — Other Ambulatory Visit: Payer: Self-pay

## 2014-07-20 ENCOUNTER — Emergency Department: Payer: Commercial Managed Care - HMO

## 2014-07-20 ENCOUNTER — Emergency Department
Admission: EM | Admit: 2014-07-20 | Discharge: 2014-07-20 | Disposition: A | Discharge status: Home / Self Care | Payer: Commercial Managed Care - HMO | Attending: Emergency Medicine | Admitting: Emergency Medicine

## 2014-07-20 DIAGNOSIS — F17210 Nicotine dependence, cigarettes, uncomplicated: Secondary | ICD-10-CM | POA: Diagnosis not present

## 2014-07-20 DIAGNOSIS — R05 Cough: Secondary | ICD-10-CM | POA: Diagnosis not present

## 2014-07-20 DIAGNOSIS — J069 Acute upper respiratory infection, unspecified: Secondary | ICD-10-CM | POA: Diagnosis not present

## 2014-07-20 DIAGNOSIS — F17200 Nicotine dependence, unspecified, uncomplicated: Secondary | ICD-10-CM

## 2014-07-20 DIAGNOSIS — J399 Disease of upper respiratory tract, unspecified: Secondary | ICD-10-CM

## 2014-07-20 DIAGNOSIS — Z720 Tobacco use: Secondary | ICD-10-CM | POA: Insufficient documentation

## 2014-07-20 DIAGNOSIS — Z79891 Long term (current) use of opiate analgesic: Secondary | ICD-10-CM | POA: Diagnosis not present

## 2014-07-20 DIAGNOSIS — R062 Wheezing: Secondary | ICD-10-CM | POA: Diagnosis not present

## 2014-07-20 DIAGNOSIS — Z79899 Other long term (current) drug therapy: Secondary | ICD-10-CM | POA: Diagnosis not present

## 2014-07-20 DIAGNOSIS — J988 Other specified respiratory disorders: Secondary | ICD-10-CM

## 2014-07-20 DIAGNOSIS — R0602 Shortness of breath: Secondary | ICD-10-CM | POA: Diagnosis not present

## 2014-07-20 LAB — URINALYSIS COMPLETE WITH MICROSCOPIC (ARMC ONLY)
Bacteria, UA: NONE SEEN
Bilirubin Urine: NEGATIVE
Glucose, UA: NEGATIVE mg/dL
Hgb urine dipstick: NEGATIVE
Ketones, ur: NEGATIVE mg/dL
Leukocytes, UA: NEGATIVE
Nitrite: NEGATIVE
Protein, ur: 30 mg/dL — AB
Specific Gravity, Urine: 1.023 (ref 1.005–1.030)
pH: 5 (ref 5.0–8.0)

## 2014-07-20 LAB — CBC WITH DIFFERENTIAL/PLATELET
Basophils Absolute: 0.1 10*3/uL (ref 0–0.1)
Basophils Relative: 1 %
Eosinophils Absolute: 0.3 10*3/uL (ref 0–0.7)
Eosinophils Relative: 3 %
HCT: 37.5 % — ABNORMAL LOW (ref 40.0–52.0)
Hemoglobin: 12 g/dL — ABNORMAL LOW (ref 13.0–18.0)
Lymphocytes Relative: 22 %
Lymphs Abs: 2.1 10*3/uL (ref 1.0–3.6)
MCH: 25.8 pg — ABNORMAL LOW (ref 26.0–34.0)
MCHC: 32 g/dL (ref 32.0–36.0)
MCV: 80.5 fL (ref 80.0–100.0)
Monocytes Absolute: 1.1 10*3/uL — ABNORMAL HIGH (ref 0.2–1.0)
Monocytes Relative: 12 %
Neutro Abs: 5.7 10*3/uL (ref 1.4–6.5)
Neutrophils Relative %: 62 %
Platelets: 315 10*3/uL (ref 150–440)
RBC: 4.66 MIL/uL (ref 4.40–5.90)
RDW: 14.1 % (ref 11.5–14.5)
WBC: 9.3 10*3/uL (ref 3.8–10.6)

## 2014-07-20 LAB — BASIC METABOLIC PANEL
Anion gap: 7 (ref 5–15)
BUN: 10 mg/dL (ref 6–20)
CO2: 30 mmol/L (ref 22–32)
Calcium: 8.2 mg/dL — ABNORMAL LOW (ref 8.9–10.3)
Chloride: 103 mmol/L (ref 101–111)
Creatinine, Ser: 0.99 mg/dL (ref 0.61–1.24)
GFR calc Af Amer: >60 mL/min (ref >60)
GFR calc non Af Amer: >60 mL/min (ref >60)
Glucose, Bld: 92 mg/dL (ref 65–99)
Potassium: 4 mmol/L (ref 3.5–5.1)
Sodium: 140 mmol/L (ref 135–145)

## 2014-07-20 LAB — TROPONIN I: Troponin I: <0.03 ng/mL (ref <0.031)

## 2014-07-20 MED ORDER — PREDNISONE 20 MG PO TABS
60.0000 mg | ORAL_TABLET | Freq: Once | ORAL | Status: AC
  Administered 2014-07-20: 60 mg via ORAL

## 2014-07-20 MED ORDER — PREDNISONE 10 MG PO TABS: ORAL_TABLET | ORAL | Status: DC

## 2014-07-20 MED ORDER — ALBUTEROL SULFATE HFA 108 (90 BASE) MCG/ACT IN AERS: 2.0000 | INHALATION_SPRAY | Freq: Four times a day (QID) | RESPIRATORY_TRACT | Status: DC | PRN

## 2014-07-20 MED ORDER — IPRATROPIUM-ALBUTEROL 0.5-2.5 (3) MG/3ML IN SOLN
3.0000 mL | Freq: Once | RESPIRATORY_TRACT | Status: AC
  Administered 2014-07-20: 3 mL via RESPIRATORY_TRACT

## 2014-07-20 NOTE — Discharge Instructions (Signed)
Asthma °Asthma is a condition of the lungs in which the airways tighten and narrow. Asthma can make it hard to breathe. Asthma cannot be cured, but medicine and lifestyle changes can help control it. Asthma may be started (triggered) by: °· Animal skin flakes (dander). °· Dust. °· Cockroaches. °· Pollen. °· Mold. °· Smoke. °· Cleaning products. °· Hair sprays or aerosol sprays. °· Paint fumes or strong smells. °· Cold air, weather changes, and winds. °· Crying or laughing hard. °· Stress. °· Certain medicines or drugs. °· Foods, such as dried fruit, potato chips, and sparkling grape juice. °· Infections or conditions (colds, flu). °· Exercise. °· Certain medical conditions or diseases. °· Exercise or tiring activities. °HOME CARE  °· Take medicine as told by your doctor. °· Use a peak flow meter as told by your doctor. A peak flow meter is a tool that measures how well the lungs are working. °· Record and keep track of the peak flow meter's readings. °· Understand and use the asthma action plan. An asthma action plan is a written plan for taking care of your asthma and treating your attacks. °· To help prevent asthma attacks: °¨ Do not smoke. Stay away from secondhand smoke. °¨ Change your heating and air conditioning filter often. °¨ Limit your use of fireplaces and wood stoves. °¨ Get rid of pests (such as roaches and mice) and their droppings. °¨ Throw away plants if you see mold on them. °¨ Clean your floors. Dust regularly. Use cleaning products that do not smell. °¨ Have someone vacuum when you are not home. Use a vacuum cleaner with a HEPA filter if possible. °¨ Replace carpet with wood, tile, or vinyl flooring. Carpet can trap animal skin flakes and dust. °¨ Use allergy-proof pillows, mattress covers, and box spring covers. °¨ Wash bed sheets and blankets every week in hot water and dry them in a dryer. °¨ Use blankets that are made of polyester or cotton. °¨ Clean bathrooms and kitchens with bleach. If  possible, have someone repaint the walls in these rooms with mold-resistant paint. Keep out of the rooms that are being cleaned and painted. °¨ Wash hands often. °GET HELP IF: °· You have make a whistling sound when breaking (wheeze), have shortness of breath, or have a cough even if taking medicine to prevent attacks. °· The colored mucus you cough up (sputum) is thicker than usual. °· The colored mucus you cough up changes from clear or white to yellow, green, gray, or bloody. °· You have problems from the medicine you are taking such as: °¨ A rash. °¨ Itching. °¨ Swelling. °¨ Trouble breathing. °· You need reliever medicines more than 2-3 times a week. °· Your peak flow measurement is still at 50-79% of your personal best after following the action plan for 1 hour. °· You have a fever. °GET HELP RIGHT AWAY IF:  °· You seem to be worse and are not responding to medicine during an asthma attack. °· You are short of breath even at rest. °· You get short of breath when doing very little activity. °· You have trouble eating, drinking, or talking. °· You have chest pain. °· You have a fast heartbeat. °· Your lips or fingernails start to turn blue. °· You are light-headed, dizzy, or faint. °· Your peak flow is less than 50% of your personal best. °MAKE SURE YOU:  °· Understand these instructions. °· Will watch your condition. °· Will get help right away if you   are not doing well or get worse. Document Released: 07/06/2007 Document Revised: 06/03/2013 Document Reviewed: 08/16/2012 [facility] Patient Information [address], [place]. This information is not intended to replace advice given to you by your health care provider. Make sure you discuss any questions you have with your health care provider.    YOU WILL NEED TO SEE DR. MORRISEY ABOUT THE LESION ON YOUR LUNG FOR A DIFFERENT TYPE OF XRAY .   CALL THE OFFICE TOMORROW FOR AN APPOINTMENT BEGIN MEDICATION TODAY AS DIRECTED

## 2014-07-20 NOTE — ED Notes (Signed)
Patient presents to the ED with cough and congestion x 2 weeks.  Patient denies fever.  Patient is speaking in full sentences, ambulatory to triage, no obvious distress at this time.  Patient reports coughing up green phlegm.

## 2014-07-20 NOTE — ED Notes (Signed)
Cough / congestion x2 weeks , worsening symptoms with greenish sputum , also now complaining of decreased energy level with increased difficulty breathing with activity, decrease food intact

## 2014-07-20 NOTE — ED Provider Notes (Signed)
-----------------------------------------   10:42 AM on 07/20/2014 -----------------------------------------  EKG reviewed and interpreted by myself shows normal sinus rhythm at 64 bpm, narrow QRS, normal axis, normal intervals, nonspecific but no concerning ST changes are noted.  Harvest Dark, MD 07/20/14 1043

## 2014-07-20 NOTE — ED Provider Notes (Signed)
[facility] Emergency Department Provider Note  ____________________________________________  Time seen:  8:37 AM  I have reviewed the triage vital signs and the nursing notes.   HISTORY  Chief Complaint Cough    HPI [name] [name] is a 74 y.o. male complains of cough and congestion for 2 weeks. He states this is productive greenish cough, he denies any fever or chills. He states that he has had increased difficulty with breathing along with decreased energy and decreased appetite.He has not taken any medication for his cough. He denies any history of pneumonia or bronchitis in the past. He does continue smoke three-quarter pack cigarettes per day and has since he was very young. He denies any chest pain, difficulty swallowing or speaking. Currently he is talking in full sentences and denies any pain at present.   Past Medical History  Diagnosis Date  . Renal disorder     kidney stones    Patient Active Problem List   Diagnosis Date Noted  . DDD (degenerative disc disease), lumbar 07/07/2014  . DDD (degenerative disc disease), cervical 07/07/2014  . DJD (degenerative joint disease) of knee 07/07/2014  . Bilateral occipital neuralgia 07/07/2014  . Spinal stenosis, lumbar region, with neurogenic claudication 07/07/2014    Past Surgical History  Procedure Laterality Date  . Knee surgery    . Neck surgery    . Inguinal hernia repair      Current Outpatient Rx  Name  Route  Sig  Dispense  Refill  . albuterol (PROVENTIL HFA;VENTOLIN HFA) 108 (90 BASE) MCG/ACT inhaler   Inhalation   Inhale 2 puffs into the lungs every 6 (six) hours as needed for wheezing or shortness of breath.   1 Inhaler   2   . azithromycin (ZITHROMAX) 250 MG tablet   Oral   Take 1 tablet (250 mg total) by mouth daily. Patient not taking: Reported on 07/07/2014   4 tablet   0   . oxycodone (OXY-IR) 5 MG capsule   Oral   Take 10 mg by mouth every 4 (four) hours as  needed.         . Oxycodone HCl 10 MG TABS      Limit1/2 to 1 tablet by mouth 3 to 6 times per day   180 tablet   0   . predniSONE (DELTASONE) 10 MG tablet      Take 5 tablets  tomorrow, on day 2 take 4 tablets, day 3 take 3 tablets, day 4 take 2 tablets, day 5 take  1 tablets   15 tablet   0     Allergies No known allergies  Family History  Problem Relation Age of Onset  . Diabetes Mother   . Diabetes Brother     Social History History  Substance Use Topics  . Smoking status: Current Every Day Smoker -- 0.75 packs/day    Types: Cigarettes  . Smokeless tobacco: Not on file  . Alcohol Use: No    Review of Systems Constitutional: No fever/chills Eyes: No visual changes. ENT: No sore throat. Cardiovascular: Denies chest pain. Respiratory: Slight shortness of breath. Gastrointestinal: No abdominal pain.  No nausea, no vomiting.  No diarrhea.  No constipation. Genitourinary: Negative for dysuria. Musculoskeletal: Negative for back pain. Skin: Negative for rash. Neurological: Negative for headaches, focal weakness or numbness.  10-point ROS otherwise negative.  ____________________________________________   PHYSICAL EXAM:  VITAL SIGNS: ED Triage Vitals  Enc Vitals Group     BP 07/20/14 0751 174/92  mmHg     Pulse Rate 07/20/14 0751 61     Resp --      Temp 07/20/14 0751 97.7 F (36.5 C)     Temp Source 07/20/14 0751 Oral     SpO2 07/20/14 0751 97 %     Weight 07/20/14 0751 160 lb (72.576 kg)     Height 07/20/14 0751 5\' 8"  (1.727 m)     Head Cir --      Peak Flow --      Pain Score 07/20/14 0830 0     Pain Loc --      Pain Edu? --      Excl. in [place]? --     Constitutional: Alert and oriented. Well appearing and in no acute distress. Eyes: Conjunctivae are normal. PERRL. EOMI. Head: Atraumatic. Nose: No congestion/rhinnorhea. Mouth/Throat: Mucous membranes are moist.  Oropharynx non-erythematous. Neck: No stridor.   Supple Hematological/Lymphatic/Immunilogical: No cervical lymphadenopathy. Cardiovascular: Normal rate, regular rhythm. Grossly normal heart sounds.  Good peripheral circulation. Respiratory: Normal respiratory effort.  No retractions. Lungs bilaterally expiratory wheezes are heard throughout. No retractions are noted. Patient continues to talk in full sentences without any difficulty. Gastrointestinal: Soft and nontender. No distention. No abdominal bruits. No CVA tenderness. Musculoskeletal: No lower extremity tenderness nor edema.  No joint effusions. Neurologic:  Normal speech and language. No gross focal neurologic deficits are appreciated. Speech is normal. No gait instability. Skin:  Skin is warm, dry and intact. No rash noted. Psychiatric: Mood and affect are normal. Speech and behavior are normal.  ____________________________________________   LABS (all labs ordered are listed, but only abnormal results are displayed)  Labs Reviewed  CBC WITH DIFFERENTIAL/PLATELET - Abnormal; Notable for the following:    Hemoglobin 12.0 (*)    HCT 37.5 (*)    MCH 25.8 (*)    Monocytes Absolute 1.1 (*)    All other components within normal limits  BASIC METABOLIC PANEL - Abnormal; Notable for the following:    Calcium 8.2 (*)    All other components within normal limits  URINALYSIS COMPLETEWITH MICROSCOPIC ([facility] ONLY) - Abnormal; Notable for the following:    Color, Urine YELLOW (*)    APPearance CLEAR (*)    Protein, ur 30 (*)    Squamous Epithelial / LPF 0-5 (*)    All other components within normal limits  TROPONIN I   ____________________________________________  EKG  EKG done and noted per Dr. Kerman Passey ____________________________________________  RADIOLOGY  Chest x-ray per radiologist showed and ovoid nodular opacity projecting over the right upper lobe. Recommended CT scan on an outpatient basis. By basilar present scarring with trace pleural fluid or  thickening. ____________________________________________   PROCEDURES  Procedure(s) performed: None  Critical Care performed: No  ____________________________________________   INITIAL IMPRESSION / ASSESSMENT AND PLAN / ED COURSE  Pertinent labs & imaging results that were available during my care of the patient were reviewed by me and considered in my medical decision making (see chart for details).  Patient was made aware of his abnormal chest x-ray and he is to follow-up with his PCP for chest x-ray and further evaluation of this area. While in the emergency room he got a DuoNeb treatment and improved. He was started on prednisone taper and given an albuterol inhaler. He will call his PCP tomorrow to make an appointment for follow-up visit. ____________________________________________   FINAL CLINICAL IMPRESSION(S) / ED DIAGNOSES  Final diagnoses:  Upper respiratory disease  Wheezing-associated respiratory infection  Smoker  [name], PA-C 07/20/14 1549  Harvest Dark, MD 07/21/14 Joen Laura

## 2014-07-23 ENCOUNTER — Ambulatory Visit: Payer: Commercial Managed Care - HMO | Admitting: Pain Medicine

## 2014-07-28 ENCOUNTER — Ambulatory Visit: Payer: Commercial Managed Care - HMO | Attending: Pain Medicine | Admitting: Pain Medicine

## 2014-07-28 ENCOUNTER — Encounter: Payer: Self-pay | Admitting: Pain Medicine

## 2014-07-28 VITALS — BP 158/66 | HR 53 | Temp 97.8°F | Resp 18 | Ht 68.0 in | Wt 160.0 lb

## 2014-07-28 DIAGNOSIS — M47816 Spondylosis without myelopathy or radiculopathy, lumbar region: Secondary | ICD-10-CM | POA: Diagnosis not present

## 2014-07-28 DIAGNOSIS — Z9889 Other specified postprocedural states: Secondary | ICD-10-CM | POA: Diagnosis not present

## 2014-07-28 DIAGNOSIS — M4806 Spinal stenosis, lumbar region: Secondary | ICD-10-CM | POA: Insufficient documentation

## 2014-07-28 DIAGNOSIS — G90529 Complex regional pain syndrome I of unspecified lower limb: Secondary | ICD-10-CM | POA: Diagnosis not present

## 2014-07-28 DIAGNOSIS — M170 Bilateral primary osteoarthritis of knee: Secondary | ICD-10-CM

## 2014-07-28 DIAGNOSIS — M48062 Spinal stenosis, lumbar region with neurogenic claudication: Secondary | ICD-10-CM

## 2014-07-28 DIAGNOSIS — M533 Sacrococcygeal disorders, not elsewhere classified: Secondary | ICD-10-CM | POA: Insufficient documentation

## 2014-07-28 DIAGNOSIS — M5481 Occipital neuralgia: Secondary | ICD-10-CM | POA: Diagnosis not present

## 2014-07-28 DIAGNOSIS — M792 Neuralgia and neuritis, unspecified: Secondary | ICD-10-CM | POA: Diagnosis not present

## 2014-07-28 DIAGNOSIS — M5136 Other intervertebral disc degeneration, lumbar region: Secondary | ICD-10-CM | POA: Diagnosis not present

## 2014-07-28 DIAGNOSIS — M179 Osteoarthritis of knee, unspecified: Secondary | ICD-10-CM | POA: Diagnosis not present

## 2014-07-28 DIAGNOSIS — M5416 Radiculopathy, lumbar region: Secondary | ICD-10-CM | POA: Diagnosis not present

## 2014-07-28 DIAGNOSIS — M5030 Other cervical disc degeneration, unspecified cervical region: Secondary | ICD-10-CM

## 2014-07-28 DIAGNOSIS — M542 Cervicalgia: Secondary | ICD-10-CM | POA: Diagnosis present

## 2014-07-28 DIAGNOSIS — R51 Headache: Secondary | ICD-10-CM | POA: Diagnosis present

## 2014-07-28 DIAGNOSIS — M47817 Spondylosis without myelopathy or radiculopathy, lumbosacral region: Secondary | ICD-10-CM | POA: Diagnosis not present

## 2014-07-28 MED ORDER — OXYCODONE HCL 10 MG PO TABS: ORAL_TABLET | ORAL | Status: DC

## 2014-07-28 NOTE — Patient Instructions (Addendum)
Continue present medications oxycodone  F/U PCP for evaliation of  BP and general medical  condition.  F/U surgical evaluation.  F/U neurological evaluation.  May consider radiofrequency rhizolysis or intraspinal procedures pending response to present treatment and F/U evaluation.  Patient to call [facility] should patient have concerns prior to scheduled return appointment.   A prescription for OXYCODONE was given to you today.

## 2014-07-28 NOTE — Progress Notes (Signed)
Safety precautions to be maintained throughout the outpatient stay will include: orient to surroundings, keep bed in low position, maintain call bell within reach at all times, provide assistance with transfer out of bed and ambulation.  Discharged via ambulatory at 830 am

## 2014-07-28 NOTE — Progress Notes (Signed)
   Subjective:    Patient ID: [name] [name], male    DOB: [birth year], 74 y.o.   MRN: [id number]  HPI  Patient is 74 year old gentleman [facility] for further evaluation and treatment of pain involving the neck associated with headaches as well as pain involving the entire back and lower extremity region. Patient is with pain involving the right knee with pain radiating to the left lower extremity with prolonged standing and walking. At the present time patient is concern about his wife who is ill and prefers to avoid interventional treatment. We will continue presently prescribed medications oxycodone and remain available to consider interventional treatment or additional modification of treatment regimen as discussed and as explained to patient on today's visit. The patient was understanding and agreement with suggested treatment plan.     Review of Systems     Objective:   Physical Exam there was tenderness of the splenius capitis and occipitalis musculature regions palpation of his reproduced pain of mild to moderate degree. There were no new no new lesions of the head and neck noted. Palpation over these thoracic facet and cervical paraspinal musculature region and cervical facet region was a tends to palpation of moderate degree. There was no crepitus of the thoracic region noted. Patient appeared to be with slightly decreased grip strength and Tinel and Phalen's maneuver without increase of pain with of any significant degree and there was unremarkable Spurling's maneuver.. Palpation of the lumbar facet lumbar paraspinal musculature region was a tends to palpation of moderate degree and  there was tenderness of the right knee compared to the left knee with negative anterior and posterior drawer signs and no ballottement of the patella was noted. Straight leg raising was tolerates approximately 20 without increased pain with dorsiflexion noted. Was tenderness over the  PSIS and PII S region of moderate degree and there was negative clonus and negative Homans. Abdomen was nontender and no costovertebral angle tenderness was noted.         Assessment & Plan:   Degenerative disc disease lumbar L3-4 and L4-5 degenerative changes of the spinal cord stenosis  Lumbar facet syndrome  Lumbar stenosis with neurogenic claudication  Sacroiliac joint dysfunction  Degenerative joint disease of knee with prior trauma and surgical intervention with concern regarding component of complex regional pain syndrome of the right knee  Greater occipital neuralgia  Plan  Continue present medications. Oxycodone   F/U PCP Dr. Lucita Lora for evaliation of  BP and general medical  condition.  F/U surgical evaluation. Dr. Hal Neer  F/U neurological evaluation.  May consider radiofrequency rhizolysis or intraspinal procedures pending response to present treatment and F/U evaluation.  Patient to call [facility] should patient have concerns prior to scheduled return appointment.

## 2014-08-26 ENCOUNTER — Encounter: Payer: Self-pay | Admitting: Pain Medicine

## 2014-08-26 ENCOUNTER — Ambulatory Visit: Payer: Commercial Managed Care - HMO | Attending: Pain Medicine | Admitting: Pain Medicine

## 2014-08-26 VITALS — BP 124/69 | HR 89 | Temp 98.6°F | Resp 16 | Ht 68.0 in | Wt 155.0 lb

## 2014-08-26 DIAGNOSIS — M4806 Spinal stenosis, lumbar region: Secondary | ICD-10-CM | POA: Insufficient documentation

## 2014-08-26 DIAGNOSIS — M546 Pain in thoracic spine: Secondary | ICD-10-CM | POA: Diagnosis present

## 2014-08-26 DIAGNOSIS — G90529 Complex regional pain syndrome I of unspecified lower limb: Secondary | ICD-10-CM | POA: Diagnosis not present

## 2014-08-26 DIAGNOSIS — M47816 Spondylosis without myelopathy or radiculopathy, lumbar region: Secondary | ICD-10-CM | POA: Diagnosis not present

## 2014-08-26 DIAGNOSIS — M179 Osteoarthritis of knee, unspecified: Secondary | ICD-10-CM | POA: Diagnosis not present

## 2014-08-26 DIAGNOSIS — M5136 Other intervertebral disc degeneration, lumbar region: Secondary | ICD-10-CM | POA: Diagnosis not present

## 2014-08-26 DIAGNOSIS — M48062 Spinal stenosis, lumbar region with neurogenic claudication: Secondary | ICD-10-CM

## 2014-08-26 DIAGNOSIS — M792 Neuralgia and neuritis, unspecified: Secondary | ICD-10-CM | POA: Diagnosis not present

## 2014-08-26 DIAGNOSIS — R51 Headache: Secondary | ICD-10-CM | POA: Insufficient documentation

## 2014-08-26 DIAGNOSIS — Z9889 Other specified postprocedural states: Secondary | ICD-10-CM | POA: Insufficient documentation

## 2014-08-26 DIAGNOSIS — M5481 Occipital neuralgia: Secondary | ICD-10-CM

## 2014-08-26 DIAGNOSIS — G90521 Complex regional pain syndrome I of right lower limb: Secondary | ICD-10-CM | POA: Diagnosis not present

## 2014-08-26 DIAGNOSIS — M47817 Spondylosis without myelopathy or radiculopathy, lumbosacral region: Secondary | ICD-10-CM | POA: Diagnosis not present

## 2014-08-26 DIAGNOSIS — M542 Cervicalgia: Secondary | ICD-10-CM | POA: Diagnosis present

## 2014-08-26 DIAGNOSIS — M170 Bilateral primary osteoarthritis of knee: Secondary | ICD-10-CM

## 2014-08-26 DIAGNOSIS — M5030 Other cervical disc degeneration, unspecified cervical region: Secondary | ICD-10-CM | POA: Diagnosis not present

## 2014-08-26 DIAGNOSIS — M5416 Radiculopathy, lumbar region: Secondary | ICD-10-CM | POA: Diagnosis not present

## 2014-08-26 MED ORDER — OXYCODONE HCL 10 MG PO TABS: ORAL_TABLET | ORAL | Status: DC

## 2014-08-26 NOTE — Progress Notes (Signed)
Safety precautions to be maintained throughout the outpatient stay will include: orient to surroundings, keep bed in low position, maintain call bell within reach at all times, provide assistance with transfer out of bed and ambulation.  

## 2014-08-26 NOTE — Progress Notes (Signed)
   Subjective:    Patient ID: YOHAN SAMONS, male    DOB: 07-Nov-1940, 74 y.o.   MRN: [id number]  HPI  Patient is 74 year old gentleman returns a [facility] for further evaluation and treatment of pain involving the neck with pain of the neck radiating to the heel associated with headaches occasionally and with mid and lower back and lower extremity pain. Patient states the pain is fairly well-controlled at this time. We will continue presently prescribed medications. Patient advised to call [facility] Management Center prior to scheduled return appointment should there be change in his condition. The patient is understanding and agrees with suggested treatment plan.  Review of Systems     Objective:   Physical Exam  There was minimal tenderness of the palpation of the splenius capitis and occipitalis musculature regions. Patient appeared to be with mild tenderness over the cervical facet cervical paraspinal musculature region. The patient appeared to be with unremarkable Spurling's maneuver. Palpation over the thoracic facet thoracic paraspinal musculature region without was a tends to palpation of mild degree. Palpation over the lumbar facets lumbar paraspinal muscular region associated with moderate moderately severe discomfort. Lateral bending and rotation reproduce severe pain. Straight leg raising was tolerates approximately 20 without increased pain with dorsiflexion noted. Clonus negative Homans. The right knee was with negative anterior and posterior drawer signs. There was no ballottement of the patella. EHL strength appeared to be decreased. There was mild tenderness of the greater trochanteric region and iliotibial band region Abdomen was nontender with no costovertebral angle tenderness noted.      Assessment & Plan:  Degenerative disc disease of the lumbar spine  L3-4 and L4-5 degenerative changes with spinal canal stenosis  Lumbar stenosis with neurogenic  claudication  Status post surgery of the right knee following trauma Degenerative joint disease of knee Complex regional pain syndrome of knee and lower extremity on the right  Degenerative disc disease of cervical spine  Cervical facet syndrome  Cervicogenic headaches  Bilateral occipital neuralgia    Plan  Continue present medication oxycodone  F/U PCP Dr. Lucita Lora for evaliation of  BP and general medical  condition.  F/U surgical evaluation. Neurosurgical reevaluation to be considered  F/U neurological evaluation. We've discussed neurological evaluation of headaches as well as pain of the lower back and lower extremity regions and will consider [facility] EMG studies and other studies  May consider radiofrequency rhizolysis or intraspinal procedures pending response to present treatment and F/U evaluation.  Patient to call [facility] should patient have concerns prior to scheduled return appointment.

## 2014-08-26 NOTE — Patient Instructions (Signed)
Continue present oxycodone  F/U PCP Dr. Rutherford Nail for evaliation of  BP and general medical  condition.  F/U surgical evaluation  F/U neurological evaluation  May consider radiofrequency rhizolysis or intraspinal procedures pending response to present treatment and F/U evaluation.  Patient to call [facility] should patient have concerns prior to scheduled return appointment.

## 2014-08-26 NOTE — Progress Notes (Signed)
   Subjective:    Patient ID: [name] [name], male    DOB: [birth year], 74 y.o.   MRN: [id number]  HPI    Review of Systems     Objective:   Physical Exam        Assessment & Plan:

## 2014-09-15 ENCOUNTER — Emergency Department
Admission: EM | Admit: 2014-09-15 | Discharge: 2014-09-15 | Disposition: A | Discharge status: Home / Self Care | Payer: Commercial Managed Care - HMO | Attending: Emergency Medicine | Admitting: Emergency Medicine

## 2014-09-15 ENCOUNTER — Encounter: Payer: Self-pay | Admitting: *Deleted

## 2014-09-15 ENCOUNTER — Emergency Department: Payer: Commercial Managed Care - HMO

## 2014-09-15 DIAGNOSIS — R109 Unspecified abdominal pain: Secondary | ICD-10-CM

## 2014-09-15 DIAGNOSIS — N2889 Other specified disorders of kidney and ureter: Secondary | ICD-10-CM | POA: Diagnosis not present

## 2014-09-15 DIAGNOSIS — Z720 Tobacco use: Secondary | ICD-10-CM | POA: Diagnosis not present

## 2014-09-15 DIAGNOSIS — N390 Urinary tract infection, site not specified: Secondary | ICD-10-CM

## 2014-09-15 DIAGNOSIS — R10A0 Flank pain, unspecified side: Secondary | ICD-10-CM

## 2014-09-15 DIAGNOSIS — N200 Calculus of kidney: Secondary | ICD-10-CM | POA: Diagnosis not present

## 2014-09-15 DIAGNOSIS — R350 Frequency of micturition: Secondary | ICD-10-CM | POA: Diagnosis present

## 2014-09-15 LAB — BASIC METABOLIC PANEL
Anion gap: 9 (ref 5–15)
BUN: 14 mg/dL (ref 6–20)
CO2: 25 mmol/L (ref 22–32)
Calcium: 8.5 mg/dL — ABNORMAL LOW (ref 8.9–10.3)
Chloride: 105 mmol/L (ref 101–111)
Creatinine, Ser: 1.18 mg/dL (ref 0.61–1.24)
GFR calc Af Amer: >60 mL/min (ref >60)
GFR calc non Af Amer: 59 mL/min — ABNORMAL LOW (ref >60)
Glucose, Bld: 94 mg/dL (ref 65–99)
Potassium: 4 mmol/L (ref 3.5–5.1)
Sodium: 139 mmol/L (ref 135–145)

## 2014-09-15 LAB — CBC
HCT: 40.9 % (ref 40.0–52.0)
Hemoglobin: 12.7 g/dL — ABNORMAL LOW (ref 13.0–18.0)
MCH: 25 pg — ABNORMAL LOW (ref 26.0–34.0)
MCHC: 31.1 g/dL — ABNORMAL LOW (ref 32.0–36.0)
MCV: 80.3 fL (ref 80.0–100.0)
Platelets: 263 10*3/uL (ref 150–440)
RBC: 5.09 MIL/uL (ref 4.40–5.90)
RDW: 15.5 % — ABNORMAL HIGH (ref 11.5–14.5)
WBC: 14.4 10*3/uL — ABNORMAL HIGH (ref 3.8–10.6)

## 2014-09-15 LAB — URINALYSIS COMPLETE WITH MICROSCOPIC (ARMC ONLY)
Bilirubin Urine: NEGATIVE
Glucose, UA: NEGATIVE mg/dL
Ketones, ur: NEGATIVE mg/dL
Nitrite: POSITIVE — AB
Protein, ur: 100 mg/dL — AB
Specific Gravity, Urine: 1.012 (ref 1.005–1.030)
Squamous Epithelial / LPF: NONE SEEN
pH: 5 (ref 5.0–8.0)

## 2014-09-15 MED ORDER — DEXTROSE 5 % IV SOLN
1.0000 g | Freq: Once | INTRAVENOUS | Status: AC
  Administered 2014-09-15: 1 g via INTRAVENOUS
  Filled 2014-09-15: qty 10

## 2014-09-15 MED ORDER — CIPROFLOXACIN HCL 500 MG PO TABS: 500.0000 mg | ORAL_TABLET | Freq: Two times a day (BID) | ORAL | Status: AC

## 2014-09-15 NOTE — ED Notes (Signed)
Urinary frequency

## 2014-09-15 NOTE — Discharge Instructions (Signed)

## 2014-09-15 NOTE — ED Notes (Signed)
Pt reports right lower back pain with urinary frequency, started a few days ago

## 2014-09-15 NOTE — ED Notes (Signed)
Patient transported to CT 

## 2014-09-15 NOTE — ED Provider Notes (Signed)
[facility] Emergency Department Provider Note  ____________________________________________  Time seen: On arrival  I have reviewed the triage vital signs and the nursing notes.   HISTORY  Chief Complaint Urinary Frequency    HPI [name] [name] is a 74 y.o. male who complains of urinary frequency. He reports the urine is cloudy with a foul odor. He also complains of some mild right-sided mid to low back discomfort. He denies a history of urinary tract infections. He denies penile discharge. No fevers no chills no nausea. No pain in his rectum.He notes this started about 2 days ago     Past Medical History  Diagnosis Date  . Renal disorder     kidney stones    Patient Active Problem List   Diagnosis Date Noted  . DDD (degenerative disc disease), lumbar 07/07/2014  . DDD (degenerative disc disease), cervical 07/07/2014  . DJD (degenerative joint disease) of knee 07/07/2014  . Bilateral occipital neuralgia 07/07/2014  . Spinal stenosis, lumbar region, with neurogenic claudication 07/07/2014    Past Surgical History  Procedure Laterality Date  . Knee surgery    . Neck surgery    . Inguinal hernia repair      Current Outpatient Rx  Name  Route  Sig  Dispense  Refill  . albuterol (PROVENTIL HFA;VENTOLIN HFA) 108 (90 BASE) MCG/ACT inhaler   Inhalation   Inhale 2 puffs into the lungs every 6 (six) hours as needed for wheezing or shortness of breath.   1 Inhaler   2   . Oxycodone HCl 10 MG TABS      Limit1/2 to 1 tablet by mouth 3 to 6 times per day   180 tablet   0     Allergies No known allergies  Family History  Problem Relation Age of Onset  . Diabetes Mother   . Diabetes Brother     Social History Social History  Substance Use Topics  . Smoking status: Current Every Day Smoker -- 0.75 packs/day    Types: Cigarettes  . Smokeless tobacco: None  . Alcohol Use: No    Review of Systems  Constitutional: Negative for  fever. Eyes: Negative for visual changes. ENT: Negative for sore throat Cardiovascular: Negative for chest pain. Respiratory: Negative for shortness of breath. Gastrointestinal: Negative for abdominal pain, vomiting and diarrhea. Genitourinary: Positive for dysuria Musculoskeletal: Positive for back pain Skin: Negative for rash. Neurological: Negative for headaches or focal weakness Psychiatric: No anxiety    ____________________________________________   PHYSICAL EXAM:  VITAL SIGNS: ED Triage Vitals  Enc Vitals Group     BP 09/15/14 0911 137/69 mmHg     Pulse Rate 09/15/14 0911 72     Resp 09/15/14 0911 18     Temp 09/15/14 0911 98.3 F (36.8 C)     Temp Source 09/15/14 0911 Oral     SpO2 09/15/14 0911 97 %     Weight 09/15/14 0911 160 lb (72.576 kg)     Height 09/15/14 0911 5\' 8"  (1.727 m)     Head Cir --      Peak Flow --      Pain Score 09/15/14 0909 9     Pain Loc --      Pain Edu? --      Excl. in [place]? --      Constitutional: Alert and oriented. Well appearing and in no distress. Eyes: Conjunctivae are normal.  ENT   Head: Normocephalic and atraumatic.   Mouth/Throat: Mucous membranes are  moist. Cardiovascular: Normal rate, regular rhythm. Normal and symmetric distal pulses are present in all extremities. No murmurs, rubs, or gallops. Respiratory: Normal respiratory effort without tachypnea nor retractions. Breath sounds are clear and equal bilaterally.  Gastrointestinal: Soft and non-tender in all quadrants. No distention. Questionable mild right-sided CVA tenderness Genitourinary: deferred Musculoskeletal: Nontender with normal range of motion in all extremities. No lower extremity tenderness nor edema. Neurologic:  Normal speech and language. No gross focal neurologic deficits are appreciated. Skin:  Skin is warm, dry and intact. No rash noted. Psychiatric: Mood and affect are normal. Patient exhibits appropriate insight and  judgment.  ____________________________________________    LABS (pertinent positives/negatives)  Labs Reviewed  CBC - Abnormal; Notable for the following:    WBC 14.4 (*)    Hemoglobin 12.7 (*)    MCH 25.0 (*)    MCHC 31.1 (*)    RDW 15.5 (*)    All other components within normal limits  BASIC METABOLIC PANEL  URINALYSIS COMPLETEWITH MICROSCOPIC ([facility] ONLY)    ____________________________________________   EKG  None  ____________________________________________    RADIOLOGY I have personally reviewed any xrays that were ordered on this patient: None  ____________________________________________   PROCEDURES  Procedure(s) performed: none  Critical Care performed: none  ____________________________________________   INITIAL IMPRESSION / ASSESSMENT AND PLAN / ED COURSE  Pertinent labs & imaging results that were available during my care of the patient were reviewed by me and considered in my medical decision making (see chart for details).  History of present illness most consistent with UTI. We'll send urine and BMP and reevaluate  Urinalysis consistent with UTI . We will give 1 g of Rocephin IV here and discharge the patient with Cipro as he is well-appearing and feels well. He knows to return if any fevers chills nausea vomiting  ____________________________________________   FINAL CLINICAL IMPRESSION(S) / ED DIAGNOSES  Final diagnoses:  Flank pain, acute  UTI (lower urinary tract infection)     Lavonia Drafts, MD 09/15/14 1527

## 2014-09-18 LAB — URINE CULTURE: Culture: >=100000

## 2014-10-07 ENCOUNTER — Ambulatory Visit: Payer: Commercial Managed Care - HMO | Attending: Pain Medicine | Admitting: Pain Medicine

## 2014-10-07 ENCOUNTER — Encounter: Payer: Self-pay | Admitting: Pain Medicine

## 2014-10-07 VITALS — BP 120/66 | HR 16 | Temp 98.2°F | Resp 16 | Ht 68.0 in | Wt 161.0 lb

## 2014-10-07 DIAGNOSIS — Z9889 Other specified postprocedural states: Secondary | ICD-10-CM | POA: Diagnosis not present

## 2014-10-07 DIAGNOSIS — M179 Osteoarthritis of knee, unspecified: Secondary | ICD-10-CM | POA: Insufficient documentation

## 2014-10-07 DIAGNOSIS — M5030 Other cervical disc degeneration, unspecified cervical region: Secondary | ICD-10-CM | POA: Diagnosis not present

## 2014-10-07 DIAGNOSIS — M47817 Spondylosis without myelopathy or radiculopathy, lumbosacral region: Secondary | ICD-10-CM | POA: Diagnosis not present

## 2014-10-07 DIAGNOSIS — R51 Headache: Secondary | ICD-10-CM | POA: Diagnosis not present

## 2014-10-07 DIAGNOSIS — G90521 Complex regional pain syndrome I of right lower limb: Secondary | ICD-10-CM | POA: Diagnosis not present

## 2014-10-07 DIAGNOSIS — M170 Bilateral primary osteoarthritis of knee: Secondary | ICD-10-CM

## 2014-10-07 DIAGNOSIS — M792 Neuralgia and neuritis, unspecified: Secondary | ICD-10-CM | POA: Diagnosis not present

## 2014-10-07 DIAGNOSIS — M48062 Spinal stenosis, lumbar region with neurogenic claudication: Secondary | ICD-10-CM

## 2014-10-07 DIAGNOSIS — M5416 Radiculopathy, lumbar region: Secondary | ICD-10-CM | POA: Diagnosis not present

## 2014-10-07 DIAGNOSIS — Z5181 Encounter for therapeutic drug level monitoring: Secondary | ICD-10-CM | POA: Diagnosis not present

## 2014-10-07 DIAGNOSIS — M4806 Spinal stenosis, lumbar region: Secondary | ICD-10-CM | POA: Insufficient documentation

## 2014-10-07 DIAGNOSIS — M47816 Spondylosis without myelopathy or radiculopathy, lumbar region: Secondary | ICD-10-CM | POA: Diagnosis not present

## 2014-10-07 DIAGNOSIS — M546 Pain in thoracic spine: Secondary | ICD-10-CM | POA: Diagnosis present

## 2014-10-07 DIAGNOSIS — Z79899 Other long term (current) drug therapy: Secondary | ICD-10-CM | POA: Diagnosis not present

## 2014-10-07 DIAGNOSIS — G90529 Complex regional pain syndrome I of unspecified lower limb: Secondary | ICD-10-CM | POA: Diagnosis not present

## 2014-10-07 DIAGNOSIS — M542 Cervicalgia: Secondary | ICD-10-CM | POA: Diagnosis present

## 2014-10-07 DIAGNOSIS — M5136 Other intervertebral disc degeneration, lumbar region: Secondary | ICD-10-CM | POA: Diagnosis not present

## 2014-10-07 DIAGNOSIS — M5481 Occipital neuralgia: Secondary | ICD-10-CM

## 2014-10-07 DIAGNOSIS — F1120 Opioid dependence, uncomplicated: Secondary | ICD-10-CM | POA: Diagnosis not present

## 2014-10-07 DIAGNOSIS — Z0389 Encounter for observation for other suspected diseases and conditions ruled out: Secondary | ICD-10-CM | POA: Diagnosis not present

## 2014-10-07 MED ORDER — OXYCODONE HCL 10 MG PO TABS: ORAL_TABLET | ORAL | Status: DC

## 2014-10-07 NOTE — Patient Instructions (Signed)
Continue present medication oxycodone   F/U PCP Dr. Lucita Lora for evaliation of  BP and general medical  condition  F/U surgical evaluation. May consider pending follow-up evaluations  F/U neurological evaluation. May consider pending follow-up evaluations  May consider radiofrequency rhizolysis or intraspinal procedures pending response to present treatment and F/U evaluation   Patient to call [facility] should patient have concerns prior to scheduled return appointment.

## 2014-10-07 NOTE — Progress Notes (Signed)
   Subjective:    Patient ID: [name] [name], male    DOB: 02-04-1940, 74 y.o.   MRN: [id number]  HPI  Patient is 74 year old gentleman returns to pain management for further evaluation and treatment of pain involving the neck and entire back upper and lower extremity regions. Patient with occasional headache and with pain involving the lower back lower extremity region aggravated by standing and walking. Patient present time states that she is caring for his wife who is ill and that he is unable to proceed with interventional treatment. We will continue not interventional treatment and will remain available to proceed with interventional treatment once patient wishes to proceed with treatment. We will also discussed further surgical evaluation and recommend patient consider surgical reevaluation as well. We'll continue oxycodone as prescribed at this time.   Review of Systems     Objective:   Physical Exam  There was tenderness over the splenius capitis and occipitalis musculature region of mild to moderate degree. There were no new masses of the head and neck noted. Patient was with slightly decreased grip strength. There was decreased grip strength on the left as well as on the right. There was negative Spurling's maneuver. There was tenderness over the thoracic facet thoracic paraspinal musculature region. Palpation of the lower thoracic paraspinal must region reproduced moderate to moderately severe discomfort. Lateral bending and rotation and extension and palpation of the lumbar facets reproduce moderately severe discomfort. Straight leg raising was tolerates approximately 20 without increased pain with dorsiflexion noted. There was tends to palpation of the right knee compared to the left knee with negative anterior and posterior drawer signs without ballottement of the patella. There was decreased EHL strength. There was negative clonus negative Homans. Abdomen was nontender and no  costovertebral angle tenderness was noted.      Assessment & Plan:    Degenerative disc disease of the lumbar spine  L3-4 and L4-5 degenerative changes with spinal canal stenosis  Lumbar stenosis with neurogenic claudication  Status post surgery of the right knee following trauma Degenerative joint disease of knee Complex regional pain syndrome of knee and lower extremity on the right  Degenerative disc disease of cervical spine  Cervical facet syndrome  Cervicogenic headaches  PLAN   Continue present medication oxycodone  F/U PCP Dr. Lucita Lora for evaliation of  BP and general medical  condition  F/U surgical evaluation. May consider pending follow-up evaluations  F/U neurological evaluation. May consider pending follow-up evaluations  May consider radiofrequency rhizolysis or intraspinal procedures pending response to present treatment and F/U evaluation   Patient to call [facility] should patient have concerns prior to scheduled return appointment.

## 2014-10-07 NOTE — Progress Notes (Signed)
Safety precautions to be maintained throughout the outpatient stay will include: orient to surroundings, keep bed in low position, maintain call bell within reach at all times, provide assistance with transfer out of bed and ambulation.  

## 2014-10-21 ENCOUNTER — Other Ambulatory Visit: Payer: Self-pay | Admitting: Pain Medicine

## 2014-11-06 ENCOUNTER — Encounter: Payer: Self-pay | Admitting: Pain Medicine

## 2014-11-06 ENCOUNTER — Ambulatory Visit: Payer: Commercial Managed Care - HMO | Attending: Pain Medicine | Admitting: Pain Medicine

## 2014-11-06 VITALS — BP 142/81 | HR 57 | Temp 97.0°F | Resp 16 | Ht 68.0 in | Wt 155.0 lb

## 2014-11-06 DIAGNOSIS — M48062 Spinal stenosis, lumbar region with neurogenic claudication: Secondary | ICD-10-CM

## 2014-11-06 DIAGNOSIS — M5136 Other intervertebral disc degeneration, lumbar region: Secondary | ICD-10-CM | POA: Diagnosis not present

## 2014-11-06 DIAGNOSIS — M542 Cervicalgia: Secondary | ICD-10-CM | POA: Diagnosis present

## 2014-11-06 DIAGNOSIS — G90529 Complex regional pain syndrome I of unspecified lower limb: Secondary | ICD-10-CM | POA: Diagnosis not present

## 2014-11-06 DIAGNOSIS — M4806 Spinal stenosis, lumbar region: Secondary | ICD-10-CM | POA: Insufficient documentation

## 2014-11-06 DIAGNOSIS — Z9889 Other specified postprocedural states: Secondary | ICD-10-CM | POA: Insufficient documentation

## 2014-11-06 DIAGNOSIS — M170 Bilateral primary osteoarthritis of knee: Secondary | ICD-10-CM

## 2014-11-06 DIAGNOSIS — M5481 Occipital neuralgia: Secondary | ICD-10-CM | POA: Insufficient documentation

## 2014-11-06 DIAGNOSIS — M5416 Radiculopathy, lumbar region: Secondary | ICD-10-CM

## 2014-11-06 DIAGNOSIS — M5030 Other cervical disc degeneration, unspecified cervical region: Secondary | ICD-10-CM | POA: Insufficient documentation

## 2014-11-06 DIAGNOSIS — M792 Neuralgia and neuritis, unspecified: Secondary | ICD-10-CM | POA: Diagnosis not present

## 2014-11-06 DIAGNOSIS — R51 Headache: Secondary | ICD-10-CM | POA: Diagnosis present

## 2014-11-06 DIAGNOSIS — M179 Osteoarthritis of knee, unspecified: Secondary | ICD-10-CM | POA: Insufficient documentation

## 2014-11-06 DIAGNOSIS — G90521 Complex regional pain syndrome I of right lower limb: Secondary | ICD-10-CM | POA: Diagnosis not present

## 2014-11-06 DIAGNOSIS — M47896 Other spondylosis, lumbar region: Secondary | ICD-10-CM | POA: Diagnosis not present

## 2014-11-06 DIAGNOSIS — G5700 Lesion of sciatic nerve, unspecified lower limb: Secondary | ICD-10-CM

## 2014-11-06 DIAGNOSIS — M47817 Spondylosis without myelopathy or radiculopathy, lumbosacral region: Secondary | ICD-10-CM | POA: Diagnosis not present

## 2014-11-06 MED ORDER — OXYCODONE HCL 10 MG PO TABS: ORAL_TABLET | ORAL | Status: DC

## 2014-11-06 NOTE — Progress Notes (Signed)
Subjective:    Patient ID: [name] [name], male    DOB: November 13, 1940, 74 y.o.   MRN: [id number]  HPI Patient is 74 year old gentleman who returns to [facility] for further evaluation and treatment of pain involving the region of the neck headaches as well as the entire back and lower extremity regions. At the present time patient states he is time to work and that his wife was ill. Patient states that these conditions have caused him excessive stress and physical demands. Patient states that he would like to undergo interventional treatment of her pain at time return appointment. We discussed patient's condition and will proceed with interventional treatment consisting of cluneal and sciatic nerve blocks at time return appointment. The patient will continue oxycodone as prescribed. Patient states that pain radiation the lower back to the lower extremity and buttocks on the right and continues distally toward the right lower extremity. Patient denies any trauma change in events of daily living the cause change in symptomatology.. We will proceed with cluneal and sciatic nerve blocks which are felt to be medically necessary procedure at time return appointment in attempt to decrease severity of patient's symptoms, minimize progression of patient's symptoms, and avoid need for more involved treatment.. The patient was understanding and in agreement with suggested treatment plan   Review of Systems     Objective:   Physical Exam There was tennis over the splenius capitis and occipitalis musculature region on the left side greater than the right of moderate degree. Palpation over the trapezius levator scapula rhomboid musculature regions reproduced pain on the left greater than the right. With moderate muscle spasms noted on the left compared to the right. Patient appeared to be with slightly decreased grip strength. Tinel and Phalen's maneuver were without increased pain of significant  degree. Patient appeared to be with unremarkable Spurling's maneuver. Palpation over the thoracic facet thoracic paraspinal musculature region was a tends to palpation of mild to moderate degree with no crepitus of the thoracic region noted. Palpation over the lumbar paraspinal muscles region lumbar facet region associated with moderately severe discomfort and extension and palpation of the lumbar facets reproduce moderately severe discomfort. Lateral bending and rotation and extension and palpation of the lumbar facets reproduce moderate discomfort right side within the left. Straight leg raising tolerates approximately 20 without increased pain with dorsiflexion noted. There was tends to palpation of the knee with negative anterior and posterior drawer signs without ballottement of the patella. Was noted. An increased pain with range of motion of the knee was moderate degree. No definite sensory deficit of dermatomal distribution of the lower extremities noted. There was negative clonus negative Homans. Abdomen was nontender with no costovertebral angle tenderness noted.       Assessment & Plan:    Degenerative disc disease of the lumbar spine  L3-4 and L4-5 degenerative changes with spinal canal stenosis  Lumbar stenosis with neurogenic claudication  Lumbar radiculopathy  Status post surgery of the right knee following trauma Degenerative joint disease of knee Complex regional pain syndrome of knee and lower extremity on the right  Degenerative disc disease of cervical spine  Cervical facet syndrome  Bilateral occipital neuralgia     PLAN   Continue present medication oxycodone  Cluneal and sciatic nerve block to be performed at time return appointment  F/U PCP Dr. Lucita Lora  for evaliation of  BP and general medical  condition  F/U surgical evaluation. May consider pending follow-up evaluations  F/U neurological  evaluation. May consider pending follow-up evaluations  May  consider radiofrequency rhizolysis or intraspinal procedures pending response to present treatment and F/U evaluation   Patient to call [facility] should patient have concerns prior to scheduled return appointment.Oxycodone   Cervicogenic headaches

## 2014-11-06 NOTE — Patient Instructions (Addendum)
PLAN   Continue present medication oxycodone  Cluneal and sciatic nerve blocks to be performed at time of return appointment  F/U PCP Dr. Lucita Lora for evaliation of  BP and general medical  condition  F/U surgical evaluation. May consider pending follow-up evaluations  F/U neurological evaluation. May consider pending follow-up evaluations  May consider radiofrequency rhizolysis or intraspinal procedures pending response to present treatment and F/U evaluation   Patient to call [facility] Center should patient have concerns prior to scheduled return appointment. No flu shot 2 weeks before or after injection.

## 2014-11-06 NOTE — Progress Notes (Signed)
Safety precautions to be maintained throughout the outpatient stay will include: orient to surroundings, keep bed in low position, maintain call bell within reach at all times, provide assistance with transfer out of bed and ambulation.  

## 2014-12-04 ENCOUNTER — Encounter: Payer: Self-pay | Admitting: Pain Medicine

## 2014-12-04 ENCOUNTER — Ambulatory Visit: Payer: Commercial Managed Care - HMO | Attending: Pain Medicine | Admitting: Pain Medicine

## 2014-12-04 VITALS — BP 134/74 | HR 68 | Temp 98.2°F | Resp 14 | Ht 69.0 in | Wt 160.0 lb

## 2014-12-04 DIAGNOSIS — G90529 Complex regional pain syndrome I of unspecified lower limb: Secondary | ICD-10-CM | POA: Diagnosis not present

## 2014-12-04 DIAGNOSIS — M79605 Pain in left leg: Secondary | ICD-10-CM | POA: Diagnosis present

## 2014-12-04 DIAGNOSIS — G90521 Complex regional pain syndrome I of right lower limb: Secondary | ICD-10-CM | POA: Insufficient documentation

## 2014-12-04 DIAGNOSIS — M79604 Pain in right leg: Secondary | ICD-10-CM | POA: Diagnosis present

## 2014-12-04 DIAGNOSIS — M5030 Other cervical disc degeneration, unspecified cervical region: Secondary | ICD-10-CM | POA: Diagnosis not present

## 2014-12-04 DIAGNOSIS — M48062 Spinal stenosis, lumbar region with neurogenic claudication: Secondary | ICD-10-CM

## 2014-12-04 DIAGNOSIS — M47896 Other spondylosis, lumbar region: Secondary | ICD-10-CM | POA: Insufficient documentation

## 2014-12-04 DIAGNOSIS — M5481 Occipital neuralgia: Secondary | ICD-10-CM

## 2014-12-04 DIAGNOSIS — M5136 Other intervertebral disc degeneration, lumbar region: Secondary | ICD-10-CM | POA: Diagnosis not present

## 2014-12-04 DIAGNOSIS — M179 Osteoarthritis of knee, unspecified: Secondary | ICD-10-CM | POA: Diagnosis not present

## 2014-12-04 DIAGNOSIS — M4806 Spinal stenosis, lumbar region: Secondary | ICD-10-CM | POA: Diagnosis not present

## 2014-12-04 DIAGNOSIS — M5416 Radiculopathy, lumbar region: Secondary | ICD-10-CM

## 2014-12-04 DIAGNOSIS — M545 Low back pain: Secondary | ICD-10-CM | POA: Diagnosis present

## 2014-12-04 DIAGNOSIS — G5700 Lesion of sciatic nerve, unspecified lower limb: Secondary | ICD-10-CM

## 2014-12-04 DIAGNOSIS — M792 Neuralgia and neuritis, unspecified: Secondary | ICD-10-CM | POA: Diagnosis not present

## 2014-12-04 DIAGNOSIS — M47817 Spondylosis without myelopathy or radiculopathy, lumbosacral region: Secondary | ICD-10-CM | POA: Diagnosis not present

## 2014-12-04 DIAGNOSIS — M170 Bilateral primary osteoarthritis of knee: Secondary | ICD-10-CM

## 2014-12-04 MED ORDER — OXYCODONE HCL 10 MG PO TABS: ORAL_TABLET | ORAL | Status: DC

## 2014-12-04 NOTE — Progress Notes (Signed)
   Subjective:    Patient ID: [name] [name], male    DOB: 09-03-1940, 74 y.o.   MRN: [id number]  HPI  Patient is 74 year old gentleman who returns to pain management for further evaluation and treatment of pain involving the lower back and lower extremity region predominantly. Patient is a history of pain involving the nNeck and upper extremity region and headache as well. The patient states that he is in need of treatment for his lower back and lower extremity pain and wishes that his insurance company would approve the procedure. Review of records revealed patient to have requested for Cluneal and sciatic nerve blocks admitted to the insurance company. We continue to await approval for patient to have procedure. The patient states that the pain is interfering with all activities of daily living as well as ability to obtain restful sleep. We will continue medications. We've also discussed surgical evaluation. Patient prefers to wait response to the  Cluneal and sciatic nerve blocks         Review of Systems     Objective:   Physical Exam   There was mild tenderness to palpation of the splenius capitis and the subpatellar musculature regions. Palpation of the acromioclavicular and glenohumeral joint regions reproduce mild discomfort. He appeared to be unremarkable Spurling's maneuver and patient appeared to be with bilaterally equal grip strength. Tinel and Phalen's maneuver without increased pain of significant degree. There was mild tenderness to palpation over the thoracic facet thoracic paraspinal musculature region and no crepitus of the thoracic region was noted. Palpation over the lumbar paraspinal musculatures and lumbosacral region was with moderately severe discomfort. Lateral bending and rotation and extension and palpation over the lumbar facets reproduced moderately severe discomfort. There was tenderness to palpation of the piriformis and gluteal musculature regions of severe  degree. Palpation of these regions reproduced severely disabling pain. Straight leg raising appeared to be decreased without a definite sensory deficit of dermatomal dystrophy detected. EHLs strength was decreased. DTRs were difficult to elicit. There was negative clonus   And negative Homans. There was tenderness topalpation and crepitus of the knee with negative anterior and posterior drawer signs without ballottement of the patella. There is mild tenderness of the greater trochanteric region and iliotibial band region. The abdomen was nontender with no costovertebral angle tenderness noted.      Assessment & Plan:    Piriformis syndrome  Degenerative disc disease of the lumbar spine  L3-4 and L4-5 degenerative changes with spinal canal stenosis  Lumbar stenosis with neurogenic claudication  Lumbar radiculopathy  Status post surgery of the right knee following trauma Degenerative joint disease of knee Complex regional pain syndrome of knee and lower extremity on the right  Degenerative disc disease of cervical spine  Cervical facet syndrome     PLAN  Continue present medication oxycodone  Cluneal and sciatic nerve block to be performed at time return appointment We are awaiting insurance approval at this time  F/U PCP Dr. Lucita Lora  for evaliation of  BP and general medical  condition  F/U surgical evaluation. May consider pending follow-up evaluations  F/U neurological evaluation. May consider pending follow-up evaluations  May consider radiofrequency rhizolysis or intraspinal procedures pending response to present treatment and F/U evaluation   Patient to call [facility] Management Center should patient have concerns prior to scheduled return appointment

## 2014-12-04 NOTE — Patient Instructions (Addendum)
PLAN   Continue present medication oxycodone  Cluneal / sciatic nerve blocks next appt Ask if insurance has approved   F/U PCP Dr. Lucita Lora for evaliation of  BP and general medical  condition  F/U surgical evaluation. May consider pending follow-up evaluations  F/U neurological evaluation. May consider pending follow-up evaluations  May consider radiofrequency rhizolysis or intraspinal procedures pending response to present treatment and F/U evaluation   Sciatica Sciatica is pain, weakness, numbness, or tingling along your sciatic nerve. The nerve starts in the lower back and runs down the back of each leg. Nerve damage or certain conditions pinch or put pressure on the sciatic nerve. This causes the pain, weakness, and other discomforts of sciatica. HOME CARE   Only take medicine as told by your doctor.  Apply ice to the affected area for 20 minutes. Do this 3-4 times a day for the first 48-72 hours. Then try heat in the same way.  Exercise, stretch, or do your usual activities if these do not make your pain worse.  Go to physical therapy as told by your doctor.  Keep all doctor visits as told.  Do not wear high heels or shoes that are not supportive.  Get a firm mattress if your mattress is too soft to lessen pain and discomfort. GET HELP RIGHT AWAY IF:   You cannot control when you poop (bowel movement) or pee (urinate).  You have more weakness in your lower back, lower belly (pelvis), butt (buttocks), or legs.  You have redness or puffiness (swelling) of your back.  You have a burning feeling when you pee.  You have pain that gets worse when you lie down.  You have pain that wakes you from your sleep.  Your pain is worse than past pain.  Your pain lasts longer than 4 weeks.  You are suddenly losing weight without reason. MAKE SURE YOU:   Understand these instructions.  Will watch this condition.  Will get help right away if you are not doing well or get  worse.   This information is not intended to replace advice given to you by your health care provider. Make sure you discuss any questions you have with your health care provider.   Document Released: 10/27/2007 Document Revised: 10/08/2014 Document Reviewed: 05/29/2011 Elsevier Interactive Patient Education 2016 [employer].  A prescription for OXYCODONE was given to you today.

## 2014-12-17 ENCOUNTER — Encounter: Payer: Self-pay | Admitting: Pain Medicine

## 2014-12-17 ENCOUNTER — Ambulatory Visit: Payer: Commercial Managed Care - HMO | Attending: Pain Medicine | Admitting: Pain Medicine

## 2014-12-17 DIAGNOSIS — M791 Myalgia: Secondary | ICD-10-CM | POA: Insufficient documentation

## 2014-12-17 DIAGNOSIS — M5416 Radiculopathy, lumbar region: Secondary | ICD-10-CM

## 2014-12-17 DIAGNOSIS — M5030 Other cervical disc degeneration, unspecified cervical region: Secondary | ICD-10-CM

## 2014-12-17 DIAGNOSIS — M5136 Other intervertebral disc degeneration, lumbar region: Secondary | ICD-10-CM | POA: Diagnosis not present

## 2014-12-17 DIAGNOSIS — M170 Bilateral primary osteoarthritis of knee: Secondary | ICD-10-CM

## 2014-12-17 DIAGNOSIS — M47816 Spondylosis without myelopathy or radiculopathy, lumbar region: Secondary | ICD-10-CM | POA: Diagnosis not present

## 2014-12-17 DIAGNOSIS — G5700 Lesion of sciatic nerve, unspecified lower limb: Secondary | ICD-10-CM

## 2014-12-17 DIAGNOSIS — M79604 Pain in right leg: Secondary | ICD-10-CM | POA: Diagnosis present

## 2014-12-17 DIAGNOSIS — M48062 Spinal stenosis, lumbar region with neurogenic claudication: Secondary | ICD-10-CM

## 2014-12-17 DIAGNOSIS — M79605 Pain in left leg: Secondary | ICD-10-CM | POA: Diagnosis present

## 2014-12-17 DIAGNOSIS — M4806 Spinal stenosis, lumbar region: Secondary | ICD-10-CM | POA: Diagnosis not present

## 2014-12-17 DIAGNOSIS — M5430 Sciatica, unspecified side: Secondary | ICD-10-CM | POA: Diagnosis not present

## 2014-12-17 DIAGNOSIS — M5481 Occipital neuralgia: Secondary | ICD-10-CM

## 2014-12-17 MED ORDER — BUPIVACAINE HCL (PF) 0.25 % IJ SOLN: 30.0000 mL | Freq: Once | INTRAMUSCULAR | Status: DC

## 2014-12-17 MED ORDER — TRIAMCINOLONE ACETONIDE 40 MG/ML IJ SUSP: 40.0000 mg | Freq: Once | INTRAMUSCULAR | Status: DC

## 2014-12-17 MED ORDER — SODIUM CHLORIDE 0.9 % IJ SOLN: 20.0000 mL | Freq: Once | INTRAMUSCULAR | Status: DC

## 2014-12-17 MED ORDER — BUPIVACAINE HCL (PF) 0.25 % IJ SOLN
INTRAMUSCULAR | Status: AC
  Administered 2014-12-17: 13:00:00
  Filled 2014-12-17: qty 30

## 2014-12-17 MED ORDER — ORPHENADRINE CITRATE 30 MG/ML IJ SOLN: 60.0000 mg | Freq: Once | INTRAMUSCULAR | Status: DC

## 2014-12-17 MED ORDER — SODIUM CHLORIDE 0.9 % IJ SOLN
INTRAMUSCULAR | Status: AC
  Administered 2014-12-17: 13:00:00
  Filled 2014-12-17: qty 10

## 2014-12-17 MED ORDER — TRIAMCINOLONE ACETONIDE 40 MG/ML IJ SUSP
INTRAMUSCULAR | Status: AC
  Administered 2014-12-17: 13:00:00
  Filled 2014-12-17: qty 1

## 2014-12-17 MED ORDER — ORPHENADRINE CITRATE 30 MG/ML IJ SOLN
INTRAMUSCULAR | Status: AC
  Filled 2014-12-17: qty 2

## 2014-12-17 NOTE — Progress Notes (Signed)
Subjective:    Patient ID: [name] [name], male    DOB: 1940-10-17, 73 y.o.   MRN: [id number]  HPI PROCEDURE PERFORMED: Cluneal sciatic nerve block   NOTE: The patient is a 74 y.o. male who returns to [facility] for further evaluation and treatment of pain involving buttocks and lower extremity of significant degree. Prior studies revealed the patient to be with  MRI findings of Degenerative disc disease of the lumbar spine  L3-4 and L4-5 degenerative changes with spinal canal stenosis. Patient with evidence of piriformis syndrome. Patient with severe spasms of the gluteal and piriformis muscles. We will proceed with cluneal and sciatic nerve block  to decrease  severity of symptoms. The risks, benefits, and expectations of the procedure have been discussed and explained to the patient who was understanding and in agreement with suggested treatment plan. We will proceed with interventional treatment as discussed and as explained to the patient who wishes to proceed with proposed treatment.   PROCEDURE #1:   Left cluneal nerve block with EKG, blood pressure, pulse, and pulse oximetry monitoring. The procedure was performed with the patient in the lateral decubitus position. Following alcohol prep of proposed entry site and identification of landmarks, a 22 -gauge needle was inserted into the gluteal musculature region and following elicitation of paresthesias radiating to the buttocks, needle was slightly withdrawn and following negative aspiration, a total of 4 mL of  preservative-free normal saline with Kenalog injected for left  cluneal nerve block. Needle removed. The patient tolerated the injection well.   PROCEDURE #2:  left sciatic nerve block with EKG, blood pressure, pulse, and pulse oximetry monitoring. The procedure was performed with the patient in the lateral decubitus position. Following identification of greater trochanter for establishing point of needle entry and alcohol  prep of proposed entry site, a 22 -gauge needle was inserted and following elicitation of paresthesias radiating from buttocks to the foot, needle was slightly withdrawn. Following negative aspiration, a total of 4 mL of preservative-free normal saline with Kenalog injected for left sciatic nerve block. Needle removed.     Myoneural  Block  Injections  Of the trapezius  Muscle  After Betadine prep  A 22-gauge needle was inserted  Into the trapezius muscle  and 2 cc  Preservative-free  normal saline with Kenalog was injected  Into the trapezius muscle 2   The patient tolerated the procedure well   A total of 10 mg of Kenalog was utilized for the procedure.   PLAN:   1. Medications: Will continue presently prescribed medication  Oxycodone. 2. Will consider modification of treatment regimen pending response to treatment rendered on today's visit and follow-up evaluation. 3. The patient is to follow-up with primary care physician   Dr. Rutherford Nail regarding blood pressure and general medical condition status pose procedure performed on today's visit. 4. Surgical evaluation as discussed. 5. Neurological evaluation as discussed. 6. The patient may be a candidate for radiofrequency procedures, Botox injections, implantation type procedures, and other treatment pending response to treatment rendered on today's visit and follow-up evaluation. 7. The patient has been advised to adhere to proper body mechanics and avoid activities which appear to aggravate condition. 8. The patient has been advised to call the Pain Management Center prior to scheduled return appointment should there be significant change in condition or should patient have other concerns regarding condition prior to scheduled return appointment.  The patient is understanding and in agreement with suggested treatment plan.  Review of Systems     Objective:   Physical Exam        Assessment & Plan:

## 2014-12-17 NOTE — Patient Instructions (Addendum)
PLAN    Continue present medication oxycodone   F/U PCP Dr. Lucita Lora for evaliation of  BP and general medical  condition  F/U surgical evaluation. May consider pending follow-up evaluations  F/U neurological evaluation. May consider pending follow-up evaluations  May consider radiofrequency rhizolysis or intraspinal procedures pending response to present treatment and F/U evaluation   Patient to call [facility] should patient have concerns prior to scheduled return appointment. GENERAL RISKS AND COMPLICATIONS  What are the risk, side effects and possible complications? Generally speaking, most procedures are safe.  However, with any procedure there are risks, side effects, and the possibility of complications.  The risks and complications are dependent upon the sites that are lesioned, or the type of nerve block to be performed.  The closer the procedure is to the spine, the more serious the risks are.  Great care is taken when placing the radio frequency needles, block needles or lesioning probes, but sometimes complications can occur. 1. Infection: Any time there is an injection through the skin, there is a risk of infection.  This is why sterile conditions are used for these blocks.  There are four possible types of infection. 1. Localized skin infection. 2. Central Nervous System Infection-This can be in the form of Meningitis, which can be deadly. 3. Epidural Infections-This can be in the form of an epidural abscess, which can cause pressure inside of the spine, causing compression of the spinal cord with subsequent paralysis. This would require an emergency surgery to decompress, and there are no guarantees that the patient would recover from the paralysis. 4. Discitis-This is an infection of the intervertebral discs.  It occurs in about 1% of discography procedures.  It is difficult to treat and it may lead to surgery.        2. Pain: the needles have to go through skin  and soft tissues, will cause soreness.       3. Damage to internal structures:  The nerves to be lesioned may be near blood vessels or    other nerves which can be potentially damaged.       4. Bleeding: Bleeding is more common if the patient is taking blood thinners such as  aspirin, Coumadin, Ticiid, Plavix, etc., or if he/she have some genetic predisposition  such as hemophilia. Bleeding into the spinal canal can cause compression of the spinal  cord with subsequent paralysis.  This would require an emergency surgery to  decompress and there are no guarantees that the patient would recover from the  paralysis.       5. Pneumothorax:  Puncturing of a lung is a possibility, every time a needle is introduced in  the area of the chest or upper back.  Pneumothorax refers to free air around the  collapsed lung(s), inside of the thoracic cavity (chest cavity).  Another two possible  complications related to a similar event would include: Hemothorax and Chylothorax.   These are variations of the Pneumothorax, where instead of air around the collapsed  lung(s), you may have blood or chyle, respectively.       6. Spinal headaches: They may occur with any procedures in the area of the spine.       7. Persistent CSF (Cerebro-Spinal Fluid) leakage: This is a rare problem, but may occur  with prolonged intrathecal or epidural catheters either due to the formation of a fistulous  track or a dural tear.       8. Nerve damage: By  working so close to the spinal cord, there is always a possibility of  nerve damage, which could be as serious as a permanent spinal cord injury with  paralysis.       9. Death:  Although rare, severe deadly allergic reactions known as "Anaphylactic  reaction" can occur to any of the medications used.      10. Worsening of the symptoms:  We can always make thing worse.  What are the chances of something like this happening? Chances of any of this occuring are extremely low.  By statistics,  you have more of a chance of getting killed in a motor vehicle accident: while driving to the hospital than any of the above occurring .  Nevertheless, you should be aware that they are possibilities.  In general, it is similar to taking a shower.  Everybody knows that you can slip, hit your head and get killed.  Does that mean that you should not shower again?  Nevertheless always keep in mind that statistics do not mean anything if you happen to be on the wrong side of them.  Even if a procedure has a 1 (one) in a 1,000,000 (million) chance of going wrong, it you happen to be that one..Also, keep in mind that by statistics, you have more of a chance of having something go wrong when taking medications.  Who should not have this procedure? If you are on a blood thinning medication (e.g. Coumadin, Plavix, see list of "Blood Thinners"), or if you have an active infection going on, you should not have the procedure.  If you are taking any blood thinners, please inform your physician.  How should I prepare for this procedure?  Do not eat or drink anything at least six hours prior to the procedure.  Bring a driver with you .  It cannot be a taxi.  Come accompanied by an adult that can drive you back, and that is strong enough to help you if your legs get weak or numb from the local anesthetic.  Take all of your medicines the morning of the procedure with just enough water to swallow them.  If you have diabetes, make sure that you are scheduled to have your procedure done first thing in the morning, whenever possible.  If you have diabetes, take only half of your insulin dose and notify our nurse that you have done so as soon as you arrive at the clinic.  If you are diabetic, but only take blood sugar pills (oral hypoglycemic), then do not take them on the morning of your procedure.  You may take them after you have had the procedure.  Do not take aspirin or any aspirin-containing medications, at  least eleven (11) days prior to the procedure.  They may prolong bleeding.  Wear loose fitting clothing that may be easy to take off and that you would not mind if it got stained with Betadine or blood.  Do not wear any jewelry or perfume  Remove any nail coloring.  It will interfere with some of our monitoring equipment.  NOTE: Remember that this is not meant to be interpreted as a complete list of all possible complications.  Unforeseen problems may occur.  BLOOD THINNERS The following drugs contain aspirin or other products, which can cause increased bleeding during surgery and should not be taken for 2 weeks prior to and 1 week after surgery.  If you should need take something for relief of minor pain, you may take  acetaminophen which is found in Tylenol,m Datril, Anacin-3 and Panadol. It is not blood thinner. The products listed below are.  Do not take any of the products listed below in addition to any listed on your instruction sheet.  A.P.C or A.P.C with Codeine Codeine Phosphate Capsules #3 Ibuprofen Ridaura  ABC compound Congesprin Imuran rimadil  Advil Cope Indocin Robaxisal  Alka-Seltzer Effervescent Pain Reliever and Antacid Coricidin or Coricidin-D  Indomethacin Rufen  Alka-Seltzer plus Cold Medicine Cosprin Ketoprofen S-A-C Tablets  Anacin Analgesic Tablets or Capsules Coumadin Korlgesic Salflex  Anacin Extra Strength Analgesic tablets or capsules CP-2 Tablets Lanoril Salicylate  Anaprox Cuprimine Capsules Levenox Salocol  Anexsia-D Dalteparin Magan Salsalate  Anodynos Darvon compound Magnesium Salicylate Sine-off  Ansaid Dasin Capsules Magsal Sodium Salicylate  Anturane Depen Capsules Marnal Soma  APF Arthritis pain formula Dewitt's Pills Measurin Stanback  Argesic Dia-Gesic Meclofenamic Sulfinpyrazone  Arthritis Bayer Timed Release Aspirin Diclofenac Meclomen Sulindac  Arthritis pain formula Anacin Dicumarol Medipren Supac  Analgesic (Safety coated) Arthralgen  Diffunasal Mefanamic Suprofen  Arthritis Strength Bufferin Dihydrocodeine Mepro Compound Suprol  Arthropan liquid Dopirydamole Methcarbomol with Aspirin Synalgos  ASA tablets/Enseals Disalcid Micrainin Tagament  Ascriptin Doan's Midol Talwin  Ascriptin A/D Dolene Mobidin Tanderil  Ascriptin Extra Strength Dolobid Moblgesic Ticlid  Ascriptin with Codeine Doloprin or Doloprin with Codeine Momentum Tolectin  Asperbuf Duoprin Mono-gesic Trendar  Aspergum Duradyne Motrin or Motrin IB Triminicin  Aspirin plain, buffered or enteric coated Durasal Myochrisine Trigesic  Aspirin Suppositories Easprin Nalfon Trillsate  Aspirin with Codeine Ecotrin Regular or Extra Strength Naprosyn Uracel  Atromid-S Efficin Naproxen Ursinus  Auranofin Capsules Elmiron Neocylate Vanquish  Axotal Emagrin Norgesic Verin  Azathioprine Empirin or Empirin with Codeine Normiflo Vitamin E  Azolid Emprazil Nuprin Voltaren  Bayer Aspirin plain, buffered or children's or timed BC Tablets or powders Encaprin Orgaran Warfarin Sodium  Buff-a-Comp Enoxaparin Orudis Zorpin  Buff-a-Comp with Codeine Equegesic Os-Cal-Gesic   Buffaprin Excedrin plain, buffered or Extra Strength Oxalid   Bufferin Arthritis Strength Feldene Oxphenbutazone   Bufferin plain or Extra Strength Feldene Capsules Oxycodone with Aspirin   Bufferin with Codeine Fenoprofen Fenoprofen Pabalate or Pabalate-SF   Buffets II Flogesic Panagesic   Buffinol plain or Extra Strength Florinal or Florinal with Codeine Panwarfarin   Buf-Tabs Flurbiprofen Penicillamine   Butalbital Compound Four-way cold tablets Penicillin   Butazolidin Fragmin Pepto-Bismol   Carbenicillin Geminisyn Percodan   Carna Arthritis Reliever Geopen Persantine   Carprofen Gold's salt Persistin   Chloramphenicol Goody's Phenylbutazone   Chloromycetin Haltrain Piroxlcam   Clmetidine heparin Plaquenil   Cllnoril Hyco-pap Ponstel   Clofibrate Hydroxy chloroquine Propoxyphen         Before  stopping any of these medications, be sure to consult the physician who ordered them.  Some, such as Coumadin (Warfarin) are ordered to prevent or treat serious conditions such as "deep thrombosis", "pumonary embolisms", and other heart problems.  The amount of time that you may need off of the medication may also vary with the medication and the reason for which you were taking it.  If you are taking any of these medications, please make sure you notify your pain physician before you undergo any procedures.         Pain Management Discharge Instructions  General Discharge Instructions :  If you need to reach your doctor call: Monday-Friday 8:00 am - 4:00 pm at [phone] or toll free [phone].  After clinic hours [phone] to have operator reach doctor.  Bring all of your medication bottles to  all your appointments in the pain clinic.  To cancel or reschedule your appointment with Pain Management please remember to call 24 hours in advance to avoid a fee.  Refer to the educational materials which you have been given on: General Risks, I had my Procedure. Discharge Instructions, Post Sedation.  Post Procedure Instructions:  The drugs you were given will stay in your system until tomorrow, so for the next 24 hours you should not drive, make any legal decisions or drink any alcoholic beverages.  You may eat anything you prefer, but it is better to start with liquids then soups and crackers, and gradually work up to solid foods.  Please notify your doctor immediately if you have any unusual bleeding, trouble breathing or pain that is not related to your normal pain.  Depending on the type of procedure that was done, some parts of your body may feel week and/or numb.  This usually clears up by tonight or the next day.  Walk with the use of an assistive device or accompanied by an adult for the 24 hours.  You may use ice on the affected area for the first 24 hours.  Put ice in  a Ziploc bag and cover with a towel and place against area 15 minutes on 15 minutes off.  You may switch to heat after 24 hours.

## 2014-12-17 NOTE — Progress Notes (Signed)
Safety precautions to be maintained throughout the outpatient stay will include: orient to surroundings, keep bed in low position, maintain call bell within reach at all times, provide assistance with transfer out of bed and ambulation.  

## 2014-12-18 ENCOUNTER — Telehealth: Payer: Self-pay | Admitting: *Deleted

## 2014-12-18 NOTE — Telephone Encounter (Signed)
Patient verbalizes no problems or concerns from procedure on 12/17/2014.

## 2015-01-01 ENCOUNTER — Encounter: Payer: Commercial Managed Care - HMO | Admitting: Pain Medicine

## 2015-01-06 ENCOUNTER — Telehealth: Payer: Self-pay | Admitting: Pain Medicine

## 2015-01-06 NOTE — Telephone Encounter (Signed)
Can patient be scheduled to come in this week for meds / would like to speak with dr crisp

## 2015-01-06 NOTE — Telephone Encounter (Signed)
[name] [name] to schedule patient for next week if I am available since this week is full

## 2015-01-14 ENCOUNTER — Encounter: Payer: Self-pay | Admitting: Pain Medicine

## 2015-01-14 ENCOUNTER — Ambulatory Visit: Payer: Commercial Managed Care - HMO | Attending: Pain Medicine | Admitting: Pain Medicine

## 2015-01-14 VITALS — BP 110/65 | HR 65 | Temp 97.9°F | Resp 14 | Ht 68.0 in | Wt 160.0 lb

## 2015-01-14 DIAGNOSIS — M5416 Radiculopathy, lumbar region: Secondary | ICD-10-CM | POA: Diagnosis not present

## 2015-01-14 DIAGNOSIS — M533 Sacrococcygeal disorders, not elsewhere classified: Secondary | ICD-10-CM | POA: Diagnosis not present

## 2015-01-14 DIAGNOSIS — G5700 Lesion of sciatic nerve, unspecified lower limb: Secondary | ICD-10-CM

## 2015-01-14 DIAGNOSIS — M5116 Intervertebral disc disorders with radiculopathy, lumbar region: Secondary | ICD-10-CM | POA: Diagnosis not present

## 2015-01-14 DIAGNOSIS — Z9889 Other specified postprocedural states: Secondary | ICD-10-CM | POA: Diagnosis not present

## 2015-01-14 DIAGNOSIS — G90521 Complex regional pain syndrome I of right lower limb: Secondary | ICD-10-CM | POA: Insufficient documentation

## 2015-01-14 DIAGNOSIS — M5136 Other intervertebral disc degeneration, lumbar region: Secondary | ICD-10-CM

## 2015-01-14 DIAGNOSIS — M179 Osteoarthritis of knee, unspecified: Secondary | ICD-10-CM | POA: Diagnosis not present

## 2015-01-14 DIAGNOSIS — M170 Bilateral primary osteoarthritis of knee: Secondary | ICD-10-CM

## 2015-01-14 DIAGNOSIS — M5030 Other cervical disc degeneration, unspecified cervical region: Secondary | ICD-10-CM | POA: Diagnosis not present

## 2015-01-14 DIAGNOSIS — M47816 Spondylosis without myelopathy or radiculopathy, lumbar region: Secondary | ICD-10-CM | POA: Diagnosis not present

## 2015-01-14 DIAGNOSIS — M545 Low back pain: Secondary | ICD-10-CM | POA: Diagnosis present

## 2015-01-14 DIAGNOSIS — M5481 Occipital neuralgia: Secondary | ICD-10-CM

## 2015-01-14 DIAGNOSIS — M48062 Spinal stenosis, lumbar region with neurogenic claudication: Secondary | ICD-10-CM

## 2015-01-14 DIAGNOSIS — M4806 Spinal stenosis, lumbar region: Secondary | ICD-10-CM | POA: Insufficient documentation

## 2015-01-14 DIAGNOSIS — M47817 Spondylosis without myelopathy or radiculopathy, lumbosacral region: Secondary | ICD-10-CM | POA: Diagnosis not present

## 2015-01-14 DIAGNOSIS — M791 Myalgia: Secondary | ICD-10-CM | POA: Diagnosis not present

## 2015-01-14 MED ORDER — OXYCODONE HCL 10 MG PO TABS: ORAL_TABLET | ORAL | Status: DC

## 2015-01-14 NOTE — Progress Notes (Signed)
Subjective:    Patient ID: [name] [name], male    DOB: April 29, 1940, 74 y.o.   MRN: [id number]  HPI  The patient is a 74-year-od gentleman who returns to pain manageent [facility] for furthe evluation and tratmnt ofpan nolvin te lower back lower extemty regionwithpain of lesser degree radiating from the bck and eck to the region  The back of the head. Patientstates that he is with painwhich becomes more intense as the day progresses. Patient's pain also inteferes with ability to obtain restful sleep. We discussed patient's condition and informed patient that we ish to proceed wit ntspinal proedres in attempt to deceas seeityo atent's sympoms, minimize pgression symptm, and avoid needfo me nvoledtreatment. We informed patient that we fel the patent may benefit from lumbar epidural steroid injection for lumba acet, media rnc nerve locks. The patient was understanding and will call pain managmentto scheduled appointment once he has transprtation avaiable. The patient states that is son will beavailable very soon and that he will bring patient to have procedure. We will continue present medication oxycodone and will consider patient for interventional treatment at time return appointment. The patient denies any trauma change in events of daily living because change in symptomatology. We have also discussed surgical reevaluation which patient prefers to avoid      Review of Systems     Objective:   Physical Exam  There was tenderness to palpation of paraspinal muscular region cervical region cervical facet region of moderate degree with moderate tenderness to palpation of the splenius capitis and occipitalis musculature regions palpation over the trapezius levator scapula rhomboid muscular regions reproduce mild to moderate discomfort. There appeared to be unremarkable Spurling's maneuver and patient was without significant increase of pain with Tinel and Phalen's maneuver. Patient appeared to have  slightly decreased grip strength. Palpation over the lumbar paraspinal musculatures and lumbar facet region was attends to palpation of moderate degree. Lateral bending rotation extension and palpation of the lumbar facets reproduce moderate discomfort. His moderate to moderately severe tenderness to palpation over the lumbar paraspinal musculature region on the left compared to the right. There was tends to palpation over the PSIS PII S region a moderate degree. There was moderate tenderness to palpation of the gluteal and piriformis musculature regions. Straight leg raising was tolerates approximately 20 without increased pain with dorsiflexion noted. There was negative clonus negative Homans. Knees were with crepitus of the knees with negative anterior and posterior drawer signs without ballottement of the patella. Was moderate tensed palpation of the knees. Abdomen nontender and no costovertebral tenderness noted.      Assessment & Plan:  Degenerative disc disease of the lumbar spine  L3-4 and L4-5 degenerative changes with spinal canal stenosis  Lumbar stenosis with neurogenic claudication  Lumbar radiculopathy  Status post surgery of the right knee following trauma Degenerative joint disease of knee Complex regional pain syndrome of knee and lower extremity on the right  Degenerative disc disease of cervical spine  Cervical facet syndrome    PLAN   Continue present medication oxycodone   You may call to schedule a procedure before your return appointment if you wish to do such As discussed your son will be here and you may want him to bring you for a procedure. Call and let me know if you wish to have a procedure and we will proceed with procedure as your convenience  F/U PCP Dr. Lucita Lora for evaliation of  BP and general medical  condition  F/U  surgical evaluation. May consider pending follow-up evaluations  F/U neurological evaluation. May consider pending follow-up  evaluations  May consider radiofrequency rhizolysis or intraspinal procedures pending response to present treatment and F/U evaluation

## 2015-01-14 NOTE — Patient Instructions (Addendum)
PLAN   Continue present medication oxycodone   You may call to schedule a procedure before your return appointment if you wish to do such As discussed your son will be here and you may want him to bring you for a procedure. Call and let me know if you wish to have a procedure and we will proceed with procedure as your convenience  F/U PCP Dr. Lucita Lora for evaliation of  BP and general medical  condition  F/U surgical evaluation. May consider pending follow-up evaluations  F/U neurological evaluation. May consider pending follow-up evaluations  May consider radiofrequency rhizolysis or intraspinal procedures pending response to present treatment and F/U evaluation   A prescription for OXYCODONE was given to you today.

## 2015-02-12 ENCOUNTER — Ambulatory Visit: Payer: Medicare HMO | Attending: Pain Medicine | Admitting: Pain Medicine

## 2015-02-12 ENCOUNTER — Encounter: Payer: Self-pay | Admitting: Pain Medicine

## 2015-02-12 VITALS — BP 136/69 | HR 66 | Temp 97.6°F | Resp 18 | Ht 68.0 in | Wt 168.0 lb

## 2015-02-12 DIAGNOSIS — M5030 Other cervical disc degeneration, unspecified cervical region: Secondary | ICD-10-CM | POA: Insufficient documentation

## 2015-02-12 DIAGNOSIS — M5416 Radiculopathy, lumbar region: Secondary | ICD-10-CM

## 2015-02-12 DIAGNOSIS — R51 Headache: Secondary | ICD-10-CM | POA: Diagnosis present

## 2015-02-12 DIAGNOSIS — M170 Bilateral primary osteoarthritis of knee: Secondary | ICD-10-CM

## 2015-02-12 DIAGNOSIS — M5116 Intervertebral disc disorders with radiculopathy, lumbar region: Secondary | ICD-10-CM | POA: Diagnosis not present

## 2015-02-12 DIAGNOSIS — Z9889 Other specified postprocedural states: Secondary | ICD-10-CM | POA: Diagnosis not present

## 2015-02-12 DIAGNOSIS — G90521 Complex regional pain syndrome I of right lower limb: Secondary | ICD-10-CM | POA: Diagnosis not present

## 2015-02-12 DIAGNOSIS — M5481 Occipital neuralgia: Secondary | ICD-10-CM

## 2015-02-12 DIAGNOSIS — M5136 Other intervertebral disc degeneration, lumbar region: Secondary | ICD-10-CM

## 2015-02-12 DIAGNOSIS — M542 Cervicalgia: Secondary | ICD-10-CM | POA: Diagnosis present

## 2015-02-12 DIAGNOSIS — M47896 Other spondylosis, lumbar region: Secondary | ICD-10-CM | POA: Diagnosis not present

## 2015-02-12 DIAGNOSIS — M4806 Spinal stenosis, lumbar region: Secondary | ICD-10-CM | POA: Insufficient documentation

## 2015-02-12 DIAGNOSIS — M179 Osteoarthritis of knee, unspecified: Secondary | ICD-10-CM | POA: Insufficient documentation

## 2015-02-12 DIAGNOSIS — M48062 Spinal stenosis, lumbar region with neurogenic claudication: Secondary | ICD-10-CM

## 2015-02-12 DIAGNOSIS — G5700 Lesion of sciatic nerve, unspecified lower limb: Secondary | ICD-10-CM

## 2015-02-12 MED ORDER — OXYCODONE HCL 10 MG PO TABS
ORAL_TABLET | ORAL | Status: DC
Start: 2015-02-12 — End: 2015-03-12

## 2015-02-12 NOTE — Patient Instructions (Signed)
  PLAN   Continue present medication oxycodone   You may call to schedule a procedure before your return appointment if you wish to do such. Call and let me know if you wish to have a procedure and we will proceed with procedure at your convenience  F/U PCP Dr. Lucita Lora for evaliation of  BP and general medical  condition  F/U surgical evaluation. May consider pending follow-up evaluations  F/U neurological evaluation. May consider pending follow-up evaluations  May consider radiofrequency rhizolysis or intraspinal procedures pending response to present treatment and F/U evaluation   Call prior to scheduled return appointment for any concerns you may have regarding your condition

## 2015-02-12 NOTE — Progress Notes (Signed)
Safety precautions to be maintained throughout the outpatient stay will include: orient to surroundings, keep bed in low position, maintain call bell within reach at all times, provide assistance with transfer out of bed and ambulation.  

## 2015-02-12 NOTE — Progress Notes (Signed)
   Subjective:    Patient ID: [name] [name], male    DOB: 1940/10/07, 75 y.o.   MRN: [id number]  HPI The patient is a 75 year old gentleman who returns to pain management for further evaluation and treatment of pain involving the neck upper extremity regions associated with headaches as well as mid and lower back lower extremity pain which is aggravated by standing walking. The patient denies any recent trauma change in events of daily living the cost change in symptomatology. The patient states that the pain has been present for significant period of time and appears to be somewhat increased. We discussed interventional treatment and remain available to perform interventional treatment. The patient will inform us when he wishes to proceed with interventional treatment. At the present time we'll continue medications as prescribed. Also recommend patient undergo reevaluation with Dr. Hal Neer for neurosurgical assessment of pain of the lumbar lower extremity region and cervical and upper extremity regions. The patient is in agreement with suggested treatment plan.   Review of Systems     Objective:   Physical Exam  There was tends to palpation of paraspinal muscular region cervical region cervical facet region palpation which reproduces pain of moderate degree there was moderate tenderness to palpation of the splenius capitis and occipitalis musculature regions. There appeared to be unremarkable Spurling's maneuver. Palpation of the acromioclavicular and glenohumeral joint regions reproduce moderate discomfort. Patient was with out significant increase of pain with Tinel and Phalen's maneuver and appeared to be with slightly decreased grip strength. Palpation over the thoracic facet thoracic paraspinal musculature region was attends to palpation with moderate muscle spasms in the mid and lower thoracic paraspinal musculature region. No crepitus of the thoracic region was noted. Palpation over the  lumbar paraspinal musculatures and lumbar facet region was attends to palpation of moderate degree with lateral bending rotation extension and palpation of the lumbar facets reproducing moderate discomfort. There was decreased straight leg raising tolerates appointment 20 without increase of pain with dorsiflexion noted. DTRs were difficult to elicit patient had difficulty relaxing. There was moderate tenderness of the PSIS and PII S region as well as the gluteal and piriformis musculature regions. Mild tenderness of the greater trochanteric region iliotibial band region. There was negative clonus negative Homans. Abdomen nontender and no costovertebral maintenance noted.      Assessment & Plan:    Degenerative disc disease of the lumbar spine  L3-4 and L4-5 degenerative changes with spinal canal stenosis  Lumbar stenosis with neurogenic claudication  Lumbar radiculopathy  Status post surgery of the right knee following trauma Degenerative joint disease of knee Complex regional pain syndrome of knee and lower extremity on the right  Degenerative disc disease of cervical spine      PLAN   Continue present medication oxycodone   You may call to schedule a procedure before your return appointment if you wish to do such. Call and let me know if you wish to have a procedure and we will proceed with procedure at your convenience  F/U PCP Dr. Lucita Lora for evaliation of  BP and general medical  condition  F/U surgical evaluation. May consider pending follow-up evaluations  F/U neurological evaluation. May consider pending follow-up evaluations  May consider radiofrequency rhizolysis or intraspinal procedures pending response to present treatment and F/U evaluation   Call prior to scheduled return appointment for any concerns you may have regarding your condition

## 2015-02-12 NOTE — Progress Notes (Deleted)
premenstrual tension syndrome  

## 2015-02-24 ENCOUNTER — Other Ambulatory Visit: Payer: Self-pay | Admitting: Pain Medicine

## 2015-03-12 ENCOUNTER — Ambulatory Visit: Payer: Medicare HMO | Attending: Pain Medicine | Admitting: Pain Medicine

## 2015-03-12 ENCOUNTER — Encounter: Payer: Self-pay | Admitting: Pain Medicine

## 2015-03-12 VITALS — BP 138/77 | HR 59 | Temp 97.9°F | Resp 14 | Ht 68.0 in | Wt 168.0 lb

## 2015-03-12 DIAGNOSIS — M5481 Occipital neuralgia: Secondary | ICD-10-CM

## 2015-03-12 DIAGNOSIS — M5136 Other intervertebral disc degeneration, lumbar region: Secondary | ICD-10-CM

## 2015-03-12 DIAGNOSIS — M47896 Other spondylosis, lumbar region: Secondary | ICD-10-CM | POA: Insufficient documentation

## 2015-03-12 DIAGNOSIS — M5416 Radiculopathy, lumbar region: Secondary | ICD-10-CM

## 2015-03-12 DIAGNOSIS — G5700 Lesion of sciatic nerve, unspecified lower limb: Secondary | ICD-10-CM

## 2015-03-12 DIAGNOSIS — M1710 Unilateral primary osteoarthritis, unspecified knee: Secondary | ICD-10-CM | POA: Diagnosis not present

## 2015-03-12 DIAGNOSIS — M170 Bilateral primary osteoarthritis of knee: Secondary | ICD-10-CM

## 2015-03-12 DIAGNOSIS — M5116 Intervertebral disc disorders with radiculopathy, lumbar region: Secondary | ICD-10-CM | POA: Diagnosis not present

## 2015-03-12 DIAGNOSIS — G90521 Complex regional pain syndrome I of right lower limb: Secondary | ICD-10-CM | POA: Insufficient documentation

## 2015-03-12 DIAGNOSIS — Z9889 Other specified postprocedural states: Secondary | ICD-10-CM | POA: Insufficient documentation

## 2015-03-12 DIAGNOSIS — M5030 Other cervical disc degeneration, unspecified cervical region: Secondary | ICD-10-CM | POA: Diagnosis not present

## 2015-03-12 DIAGNOSIS — M546 Pain in thoracic spine: Secondary | ICD-10-CM | POA: Diagnosis present

## 2015-03-12 DIAGNOSIS — M542 Cervicalgia: Secondary | ICD-10-CM | POA: Diagnosis present

## 2015-03-12 DIAGNOSIS — M4806 Spinal stenosis, lumbar region: Secondary | ICD-10-CM | POA: Diagnosis not present

## 2015-03-12 DIAGNOSIS — R51 Headache: Secondary | ICD-10-CM | POA: Diagnosis present

## 2015-03-12 DIAGNOSIS — M48062 Spinal stenosis, lumbar region with neurogenic claudication: Secondary | ICD-10-CM

## 2015-03-12 MED ORDER — OXYCODONE HCL 10 MG PO TABS: ORAL_TABLET | ORAL | Status: DC

## 2015-03-12 NOTE — Progress Notes (Signed)
Safety precautions to be maintained throughout the outpatient stay will include: orient to surroundings, keep bed in low position, maintain call bell within reach at all times, provide assistance with transfer out of bed and ambulation.  

## 2015-03-12 NOTE — Progress Notes (Signed)
   Subjective:    Patient ID: [name] [name], male    DOB: 02-19-1940, 75 y.o.   MRN: [id number]  HPI    Review of Systems     Objective:   Physical Exam        Assessment & Plan:  PLAN   Continue present medication oxycodone   You may call to schedule a procedure before your return appointment if you wish to do such. Call and let me know if you wish to have a procedure and we will proceed with procedure at your convenience as we previously discussed  F/U PCP Dr. Lucita Lora for evaliation of  BP and general medical  condition  F/U surgical evaluation. May consider pending follow-up evaluations  F/U neurological evaluation. May consider pending follow-up evaluations  May consider radiofrequency rhizolysis or intraspinal procedures pending response to present treatment and F/U evaluation   Call prior to scheduled return appointment for any concerns you may have regarding your condition

## 2015-03-12 NOTE — Patient Instructions (Signed)
  PLAN   Continue present medication oxycodone   You may call to schedule a procedure before your return appointment if you wish to do such. Call and let me know if you wish to have a procedure and we will proceed with procedure at your convenience as we previously discussed  F/U PCP Dr. Lucita Lora for evaliation of  BP and general medical  condition  F/U surgical evaluation. May consider pending follow-up evaluations  F/U neurological evaluation. May consider pending follow-up evaluations  May consider radiofrequency rhizolysis or intraspinal procedures pending response to present treatment and F/U evaluation   Call prior to scheduled return appointment for any concerns you may have regarding your condition

## 2015-03-12 NOTE — Progress Notes (Signed)
Subjective:    Patient ID: [name] [name], male    DOB: 08/18/1940, 75 y.o.   MRN: [id number]  HPI  The patient is a 75 year old gentleman who returns to pain management Center for further evaluation and treatment of pain involving the neck headaches and back upper and lower extremity regions. The patient is status post surgery of the cervical region and of the right knee. Patient's condition on today's visit and patient is with pain involving the cervical region associated with headaches. We've discussed patient undergoing reevaluation with Dr. Hal Neer.  At the present time patient states that the pain associated with spasms which appear to radiate from the neck to the back of the head causing headache. We discussed performing interventional treatment for treatment of the pain of the neck and headache consisting of rate occipital nerve blocks. We will also discuss interventional treatment of the lumbar and lower extremity region. We will remain available to patient to call should he wish to consider such treatment. At the present time we will continue patient's oxycodone. The patient was with understanding and in agreement with suggested treatment plan the patient denied any trauma change in events of daily living the call significant change in symptomatology. The patient is able to perform most activities of daily living without severe disabling pain interfering with activities of daily living. The patient does experience pain which is uncomfortable yet he is able to perform most activities. We will remain available should pain becomes so severe the patient is unable to perform activities as desired and wishes to proceed with interventional treatment as well as be considered for additional modifications of treatment regimen      Review of Systems     Objective:   Physical Exam  There was tenderness to palpation of paraspinal misreading cervical region cervical facet region palpation which  reproduces pain of moderate degree with moderate muscle spasms of the splenius capitis and occipitalis musculature region. Palpation of the acromioclavicular and glenohumeral joint regions reproduce moderate discomfort and patient was with unremarkable Spurling's maneuver. The patient was able to perform drop test with mild difficulty. The patient appeared to be with bilaterally equal grip strength and Tinel and Phalen's maneuver were without increase of pain of significant degree. The thoracic facet thoracic paraspinal musculature region was with no crepitus of the thoracic region noted. There was evidence of muscle spasms of the thoracic region of moderate degree palpation over the lumbar paraspinal must reason lumbar facet region was with moderate tends to palpation with lateral bending rotation extension and palpation of the lumbar facets reproducing moderate discomfort with moderate muscle spasms of the lumbar paraspinal musculature region. Straight leg raise was tolerates approximately 20 without a definite increased pain with dorsiflexion noted. The knee was attends to palpation with negative anterior and posterior drawer signs without ballottement of the patella. Crepitus of the knees noted. EHL strength appeared to be decreased there was tends to palpation of the PSIS and PII S region a moderate degree with moderate tenderness of the greater trochanteric region and iliotibial band region. Abdomen was nontender with no costovertebral tenderness noted.          Assessment & Plan:     Degenerative disc disease of the lumbar spine  L3-4 and L4-5 degenerative changes with spinal canal stenosis  Lumbar stenosis with neurogenic claudication  Lumbar radiculopathy  Status post surgery of the right knee following trauma Degenerative joint disease of knee Complex regional pain syndrome of knee and lower extremity  on the right  Degenerative disc disease of cervical spine  Bilateral occipital  neuralgia

## 2015-04-09 ENCOUNTER — Encounter: Payer: Self-pay | Admitting: Pain Medicine

## 2015-04-09 ENCOUNTER — Ambulatory Visit: Payer: Medicare HMO | Attending: Pain Medicine | Admitting: Pain Medicine

## 2015-04-09 VITALS — BP 119/73 | HR 65 | Temp 97.9°F | Resp 15 | Ht 68.0 in | Wt 165.0 lb

## 2015-04-09 DIAGNOSIS — M1710 Unilateral primary osteoarthritis, unspecified knee: Secondary | ICD-10-CM | POA: Diagnosis not present

## 2015-04-09 DIAGNOSIS — R51 Headache: Secondary | ICD-10-CM | POA: Diagnosis present

## 2015-04-09 DIAGNOSIS — M5116 Intervertebral disc disorders with radiculopathy, lumbar region: Secondary | ICD-10-CM | POA: Diagnosis not present

## 2015-04-09 DIAGNOSIS — M5030 Other cervical disc degeneration, unspecified cervical region: Secondary | ICD-10-CM | POA: Diagnosis not present

## 2015-04-09 DIAGNOSIS — G90521 Complex regional pain syndrome I of right lower limb: Secondary | ICD-10-CM | POA: Diagnosis not present

## 2015-04-09 DIAGNOSIS — M5481 Occipital neuralgia: Secondary | ICD-10-CM | POA: Insufficient documentation

## 2015-04-09 DIAGNOSIS — M48062 Spinal stenosis, lumbar region with neurogenic claudication: Secondary | ICD-10-CM

## 2015-04-09 DIAGNOSIS — M546 Pain in thoracic spine: Secondary | ICD-10-CM | POA: Diagnosis present

## 2015-04-09 DIAGNOSIS — Z9889 Other specified postprocedural states: Secondary | ICD-10-CM | POA: Insufficient documentation

## 2015-04-09 DIAGNOSIS — M542 Cervicalgia: Secondary | ICD-10-CM | POA: Diagnosis present

## 2015-04-09 DIAGNOSIS — M47896 Other spondylosis, lumbar region: Secondary | ICD-10-CM | POA: Insufficient documentation

## 2015-04-09 DIAGNOSIS — M5416 Radiculopathy, lumbar region: Secondary | ICD-10-CM

## 2015-04-09 DIAGNOSIS — G5700 Lesion of sciatic nerve, unspecified lower limb: Secondary | ICD-10-CM

## 2015-04-09 DIAGNOSIS — M170 Bilateral primary osteoarthritis of knee: Secondary | ICD-10-CM

## 2015-04-09 DIAGNOSIS — M5136 Other intervertebral disc degeneration, lumbar region: Secondary | ICD-10-CM

## 2015-04-09 DIAGNOSIS — M4806 Spinal stenosis, lumbar region: Secondary | ICD-10-CM | POA: Insufficient documentation

## 2015-04-09 DIAGNOSIS — M51369 Other intervertebral disc degeneration, lumbar region without mention of lumbar back pain or lower extremity pain: Secondary | ICD-10-CM

## 2015-04-09 MED ORDER — OXYCODONE HCL 10 MG PO TABS: ORAL_TABLET | ORAL | Status: DC

## 2015-04-09 NOTE — Progress Notes (Signed)
Subjective:    Patient ID: [name] [name], male    DOB: January 25, 1941, 75 y.o.   MRN: [id number]  HPI The patient is a 75 year old gentleman who returns to pain management for further evaluation and treatment of pain involving the region of the neck upper extremity region headaches upper mid lower back and lower extremity regions. The patient states that his pain of the lower back region has been quite bothersome and that his pain in the involving the region of the knee has also been quite bothersome. The patient denies any significant trauma change in events of daily living the call significant change in symptomatology. The patient states that the pain of the knee as a burning sensation aggravated by activity as on the feet. We discussed treatment of the knee and may consider patient for geniculate nerve blocks of the knee. The patient is with prior surgery of the knee and has had persistent pain of the knee despite prior treatment. We will continue oxycodone as prescribed and patient will call pain management center prior to scheduled return appointment should his pain of the knee persist or should he have other concerns regarding condition. The patient was in agreement with suggested treatment plan. The patient also is with history of headaches as well as pain involving the cervical region with prior surgical intervention of the cervical region performed by Dr. Hal Neer. We have advised patient to call pain management Center should he have any increased pain involving the cervical upper extremity regions lower back or lower extremity region or headaches. The patient agreed to suggested treatment plan   Review of Systems     Objective:   Physical Exam   There was moderate tenderness of the splenius capitis and occipitalis musculature region on the left as well as on the right. Palpation over the cervical facet cervical paraspinal musculature region was with moderate discomfort. No bounding  pulsations of the temporal region were noted. No masses of the head and neck were noted. Palpation of the acromioclavicular and glenohumeral joint regions reproduce moderate discomfort. The patient was with moderate tenderness to palpation of the acromioclavicular and glenohumeral joint regions on the left as well as on the right. Tinel and Phalen's maneuver were without increased pain of significant degree and patient appeared to be with bilaterally equal grip strength. Palpation over the thoracic region thoracic facet region was with moderate tenderness to palpation with no crepitus of the thoracic region noted. Palpation over the region of the lumbar paraspinal must reason lumbar facet region was with moderate tenderness to palpation with lateral bending rotation extension and palpation of the lumbar facets reproducing moderate discomfort. The right knee was attends to palpation with well-healed scars of the right knee. There was crepitus of the knees. No increased warmth erythema of the knee noted there was tenderness to palpation of the knee with range of motion maneuvers especially. There was negative anterior and posterior drawer signs without ballottement of the patella. Straight leg raise was tolerates approximately 20 without increase of pain with dorsiflexion noted. DTRs were difficult to elicit patient had difficulty relaxing. No sensory deficit or dermatomal distribution was detected. There was negative clonus negative Homans. Abdomen was nontender with no costovertebral tenderness noted     Assessment & Plan:     Degenerative disc disease of the lumbar spine  L3-4 and L4-5 degenerative changes with spinal canal stenosis  Lumbar stenosis with neurogenic claudication  Lumbar radiculopathy  Status post surgery of the right knee following trauma  Degenerative joint disease of knee Complex regional pain syndrome of knee and lower extremity on the right  Degenerative disc disease of  cervical spine  Bilateral occipital neuralgia      PLAN   Continue present medication oxycodone   F/U PCP Dr. Lucita Lora for evaliation of  BP and general medical  condition  F/U surgical evaluation. May consider pending follow-up evaluations  F/U neurological evaluation. May consider [facility] EMG studies and other studies pending follow-up evaluations  May consider radiofrequency rhizolysis or intraspinal procedures pending response to present treatment and F/U evaluation . Patient prefers to avoid such treatment at this time  Call prior to scheduled return appointment for any concerns you may have regarding your condition

## 2015-04-09 NOTE — Progress Notes (Signed)
Safety precautions to be maintained throughout the outpatient stay will include: orient to surroundings, keep bed in low position, maintain call bell within reach at all times, provide assistance with transfer out of bed and ambulation.  

## 2015-04-09 NOTE — Patient Instructions (Signed)
  PLAN   Continue present medication oxycodone   F/U PCP Dr. Lucita Lora for evaliation of  BP and general medical  condition  F/U surgical evaluation. May consider pending follow-up evaluations  F/U neurological evaluation. May consider pending follow-up evaluations  May consider radiofrequency rhizolysis or intraspinal procedures pending response to present treatment and F/U evaluation   Call prior to scheduled return appointment for any concerns you may have regarding your condition

## 2015-05-14 ENCOUNTER — Encounter: Payer: Self-pay | Admitting: Pain Medicine

## 2015-05-14 ENCOUNTER — Ambulatory Visit: Payer: Medicare HMO | Attending: Pain Medicine | Admitting: Pain Medicine

## 2015-05-14 VITALS — BP 127/67 | HR 58 | Temp 98.1°F | Ht 68.0 in | Wt 160.0 lb

## 2015-05-14 DIAGNOSIS — M5116 Intervertebral disc disorders with radiculopathy, lumbar region: Secondary | ICD-10-CM | POA: Insufficient documentation

## 2015-05-14 DIAGNOSIS — M4806 Spinal stenosis, lumbar region: Secondary | ICD-10-CM | POA: Insufficient documentation

## 2015-05-14 DIAGNOSIS — Z9889 Other specified postprocedural states: Secondary | ICD-10-CM | POA: Insufficient documentation

## 2015-05-14 DIAGNOSIS — M545 Low back pain: Secondary | ICD-10-CM | POA: Diagnosis present

## 2015-05-14 DIAGNOSIS — M5481 Occipital neuralgia: Secondary | ICD-10-CM

## 2015-05-14 DIAGNOSIS — G90521 Complex regional pain syndrome I of right lower limb: Secondary | ICD-10-CM | POA: Insufficient documentation

## 2015-05-14 DIAGNOSIS — M47896 Other spondylosis, lumbar region: Secondary | ICD-10-CM | POA: Diagnosis not present

## 2015-05-14 DIAGNOSIS — M5416 Radiculopathy, lumbar region: Secondary | ICD-10-CM

## 2015-05-14 DIAGNOSIS — G5700 Lesion of sciatic nerve, unspecified lower limb: Secondary | ICD-10-CM

## 2015-05-14 DIAGNOSIS — M179 Osteoarthritis of knee, unspecified: Secondary | ICD-10-CM | POA: Diagnosis not present

## 2015-05-14 DIAGNOSIS — M5030 Other cervical disc degeneration, unspecified cervical region: Secondary | ICD-10-CM

## 2015-05-14 DIAGNOSIS — Z981 Arthrodesis status: Secondary | ICD-10-CM

## 2015-05-14 DIAGNOSIS — M5136 Other intervertebral disc degeneration, lumbar region: Secondary | ICD-10-CM

## 2015-05-14 DIAGNOSIS — M51369 Other intervertebral disc degeneration, lumbar region without mention of lumbar back pain or lower extremity pain: Secondary | ICD-10-CM

## 2015-05-14 DIAGNOSIS — M542 Cervicalgia: Secondary | ICD-10-CM | POA: Diagnosis present

## 2015-05-14 DIAGNOSIS — M79606 Pain in leg, unspecified: Secondary | ICD-10-CM | POA: Diagnosis present

## 2015-05-14 DIAGNOSIS — M170 Bilateral primary osteoarthritis of knee: Secondary | ICD-10-CM

## 2015-05-14 DIAGNOSIS — M48062 Spinal stenosis, lumbar region with neurogenic claudication: Secondary | ICD-10-CM

## 2015-05-14 MED ORDER — OXYCODONE HCL 10 MG PO TABS: ORAL_TABLET | ORAL | Status: DC

## 2015-05-14 NOTE — Patient Instructions (Addendum)
  PLAN   Continue present medication oxycodone   F/U PCP Dr. Lucita Lora for evaliation of  BP and general medical  condition  F/U surgical evaluation. Asked the secretary and nurses the date of your appointment to see Dr. Hal Neer to evaluate pain of neck and upper extremity  F/U neurological evaluation. May consider [facility] EMG studies and other studies pending follow-up evaluations  May consider radiofrequency rhizolysis or intraspinal procedures pending response to present treatment and F/U evaluation   Call prior to scheduled return appointment for any concerns you may have regarding your conditionPain Management Discharge Instructions  General Discharge Instructions :  If you need to reach your doctor call: Monday-Friday 8:00 am - 4:00 pm at [phone] or toll free [phone].  After clinic hours [phone] to have operator reach doctor.  Bring all of your medication bottles to all your appointments in the pain clinic.  To cancel or reschedule your appointment with Pain Management please remember to call 24 hours in advance to avoid a fee.  Refer to the educational materials which you have been given on: General Risks, I had my Procedure. Discharge Instructions, Post Sedation.  Post Procedure Instructions:  The drugs you were given will stay in your system until tomorrow, so for the next 24 hours you should not drive, make any legal decisions or drink any alcoholic beverages.  You may eat anything you prefer, but it is better to start with liquids then soups and crackers, and gradually work up to solid foods.  Please notify your doctor immediately if you have any unusual bleeding, trouble breathing or pain that is not related to your normal pain.  Depending on the type of procedure that was done, some parts of your body may feel week and/or numb.  This usually clears up by tonight or the next day.  Walk with the use of an assistive device or accompanied by an adult for the  24 hours.  You may use ice on the affected area for the first 24 hours.  Put ice in a Ziploc bag and cover with a towel and place against area 15 minutes on 15 minutes off.  You may switch to heat after 24 hours.

## 2015-05-14 NOTE — Progress Notes (Signed)
Subjective:    Patient ID: [name] [name], male    DOB: 1940/05/30, 75 y.o.   MRN: [id number]  HPI  The patient is a 75 year old gentleman who returns to pain management for further evaluation and treatment of pain involving the neck upper extremity region as well as the lower back and lower extremity region. The patient admits to pain occurring the back of the neck radiating to the upper extremity region with pain aggravated with region lifting pushing pulling maneuvers. The patient denies any recent trauma change in events of daily living because change in symptomatology. The patient is status post prior surgical intervention of the cervical region and we will have patient follow up Dr. Hal Neer at this time. The patient also is with lower back lower extremity pain with pain involving the region of the knees especially. We discussed further evaluation this regard and have recommended the patient discuss pain of the lumbar lower extremity region when he returns to Dr. Hal Neer for evaluation of pain involving the cervical upper extremity region associated with weakness. We will continue oxycodone as prescribed this time and we will remain available to consider patient for interventional treatment of additional modifications of treatment regimen pending surgical disposition of Dr. Hal Neer.. All agreed to suggested treatment plan      Review of Systems     Objective:   Physical Exam  There was tends to palpation of paraspinal misreading cervical and cervical facet region palpation which reproduces moderate to moderately severe discomfort. The patient was with increased pain with range of motion maneuvers of the cervical spine. There appeared to be question decreased grip strength on the right compared to the left with tenderness to palpation over the thoracic facet thoracic paraspinal musculature region as well as of moderate degree. There was no crepitus of the thoracic region noted. Palpation  over the cervical facet cervical paraspinal musculature region reproduced her lower portion of patient's pain with patient being with decreased grip strength and without significant increase of pain with Tinel and Phalen's maneuver. Palpation over the PSIS and PII S regions as well as the lumbar and lower extremity region with tenderness to palpation with tenderness to palpation of the right knee especially straight leg raise was tolerates approximately 30 without increased pain with dorsiflexion noted. DTRs were difficult to elicit. There was significant crepitus of the knees noted with negative anterior and posterior drawer signs without ballottement of the patella. Abdomen nontender with no costovertebral tenderness noted    Assessment & Plan:      Degenerative disc disease of the lumbar spine  L3-4 and L4-5 degenerative changes with spinal canal stenosis  Lumbar stenosis with neurogenic claudication  Lumbar radiculopathy  Status post surgery of the right knee following trauma Degenerative joint disease of knee Complex regional pain syndrome of knee and lower extremity on the right  Degenerative disc disease of cervical spine  Bilateral occipital neuralgia      PLAN   Continue present medication oxycodone   Will consider patient for interventional treatment pending surgical disposition of Dr. Hal Neer  F/U PCP Dr. Lucita Lora for evaliation of  BP and general medical  condition  F/U surgical evaluation. Asked the secretary and nurses the date of your appointment to see Dr. Hal Neer to evaluate pain of neck and upper extremity  F/U neurological evaluation. May consider [facility] EMG studies and other studies pending follow-up evaluations  May consider radiofrequency rhizolysis or intraspinal procedures pending response to present treatment and F/U evaluation  Call prior to scheduled return appointment for any concerns you may have regarding your condition

## 2015-05-14 NOTE — Progress Notes (Signed)
Safety precautions to be maintained throughout the outpatient stay will include: orient to surroundings, keep bed in low position, maintain call bell within reach at all times, provide assistance with transfer out of bed and ambulation.  

## 2015-06-09 ENCOUNTER — Encounter: Payer: Self-pay | Admitting: Pain Medicine

## 2015-06-09 ENCOUNTER — Ambulatory Visit: Payer: Medicare HMO | Attending: Pain Medicine | Admitting: Pain Medicine

## 2015-06-09 VITALS — BP 125/71 | HR 64 | Temp 98.0°F | Resp 18 | Ht 68.0 in | Wt 165.0 lb

## 2015-06-09 DIAGNOSIS — G90521 Complex regional pain syndrome I of right lower limb: Secondary | ICD-10-CM | POA: Insufficient documentation

## 2015-06-09 DIAGNOSIS — M5116 Intervertebral disc disorders with radiculopathy, lumbar region: Secondary | ICD-10-CM | POA: Diagnosis not present

## 2015-06-09 DIAGNOSIS — M5416 Radiculopathy, lumbar region: Secondary | ICD-10-CM

## 2015-06-09 DIAGNOSIS — M179 Osteoarthritis of knee, unspecified: Secondary | ICD-10-CM | POA: Insufficient documentation

## 2015-06-09 DIAGNOSIS — M48062 Spinal stenosis, lumbar region with neurogenic claudication: Secondary | ICD-10-CM

## 2015-06-09 DIAGNOSIS — M47896 Other spondylosis, lumbar region: Secondary | ICD-10-CM | POA: Diagnosis not present

## 2015-06-09 DIAGNOSIS — Z981 Arthrodesis status: Secondary | ICD-10-CM

## 2015-06-09 DIAGNOSIS — M4806 Spinal stenosis, lumbar region: Secondary | ICD-10-CM | POA: Diagnosis not present

## 2015-06-09 DIAGNOSIS — M5481 Occipital neuralgia: Secondary | ICD-10-CM

## 2015-06-09 DIAGNOSIS — M51369 Other intervertebral disc degeneration, lumbar region without mention of lumbar back pain or lower extremity pain: Secondary | ICD-10-CM

## 2015-06-09 DIAGNOSIS — M542 Cervicalgia: Secondary | ICD-10-CM | POA: Diagnosis present

## 2015-06-09 DIAGNOSIS — M5030 Other cervical disc degeneration, unspecified cervical region: Secondary | ICD-10-CM

## 2015-06-09 DIAGNOSIS — Z9889 Other specified postprocedural states: Secondary | ICD-10-CM | POA: Insufficient documentation

## 2015-06-09 DIAGNOSIS — M5136 Other intervertebral disc degeneration, lumbar region: Secondary | ICD-10-CM

## 2015-06-09 DIAGNOSIS — M170 Bilateral primary osteoarthritis of knee: Secondary | ICD-10-CM

## 2015-06-09 DIAGNOSIS — G5700 Lesion of sciatic nerve, unspecified lower limb: Secondary | ICD-10-CM

## 2015-06-09 MED ORDER — OXYCODONE HCL 10 MG PO TABS: ORAL_TABLET | ORAL | Status: DC

## 2015-06-09 NOTE — Progress Notes (Signed)
Safety precautions to be maintained throughout the outpatient stay will include: orient to surroundings, keep bed in low position, maintain call bell within reach at all times, provide assistance with transfer out of bed and ambulation.  

## 2015-06-09 NOTE — Patient Instructions (Addendum)
  PLAN   Continue present medication oxycodone   F/U PCP Dr. Lucita Lora for evaliation of  BP and general medical  condition  F/U surgical evaluation. Asked the secretary and nurses the date of your appointment to see Dr. Hal Neer to evaluate pain of neck and upper extremity as we discussed this morning. Since the neurosurgeon's office has tried to call you you can call and schedule appointment for a time convenient for you. Please call and schedule your appointment with Dr. Hal Neer neurosurgeon if you wish to do such.  F/U neurological evaluation. May consider [facility] EMG studies and other studies pending follow-up evaluations  May consider radiofrequency rhizolysis or intraspinal procedures pending response to present treatment and F/U evaluation   Call prior to scheduled return appointment for any concerns you may have regarding your conditionPain Management Discharge Instructions  General Discharge Instructions :  If you need to reach your doctor call: Monday-Friday 8:00 am - 4:00 pm at [phone] or toll free [phone].  After clinic hours [phone] to have operator reach doctor.  Bring all of your medication bottles to all your appointments in the pain clinic.  To cancel or reschedule your appointment with Pain Management please remember to call 24 hours in advance to avoid a fee.  Refer to the educational materials which you have been given on: General Risks, I had my Procedure. Discharge Instructions, Post Sedation.  Post Procedure Instructions:  The drugs you were given will stay in your system until tomorrow, so for the next 24 hours you should not drive, make any legal decisions or drink any alcoholic beverages.  You may eat anything you prefer, but it is better to start with liquids then soups and crackers, and gradually work up to solid foods.  Please notify your doctor immediately if you have any unusual bleeding, trouble breathing or pain that is not related to your  normal pain.  Depending on the type of procedure that was done, some parts of your body may feel week and/or numb.  This usually clears up by tonight or the next day.  Walk with the use of an assistive device or accompanied by an adult for the 24 hours.  You may use ice on the affected area for the first 24 hours.  Put ice in a Ziploc bag and cover with a towel and place against area 15 minutes on 15 minutes off.  You may switch to heat after 24 hours.

## 2015-06-09 NOTE — Progress Notes (Signed)
Subjective:    Patient ID: [name] [name], male    DOB: 22-Jan-1941, 75 y.o.   MRN: [id number]  HPI  The patient is a 75 year old gentleman who returns to pain management for further evaluation and treatment of pain involving the neck upper extremity region as well as the lower back and lower extremity region. The patient has had pain with range of motion maneuvers of the neck and is with prior surgical intervention of the cervical region performed by Dr. Hal Neer. The have requested neurosurgical evaluation with Dr. Hal Neer and patient will follow-up Dr. Hal Neer for reevaluation as discussed. The patient also is with pain involving the lumbar lower extremity region and region of the right knee especially. At the present time we will continue oxycodone as prescribed and we'll await neurosurgical evaluation by Dr. Hal Neer and will consider patient for modification of treatment regimen as discussed and as explained to patient on today's visit. We informed patient that patient may be a candidate for interventional treatment in the cervical region and pending surgical disposition of Dr. Hal Neer we may consider such treatment. We will also discussed evaluation and treatment of the lower extremity pain and pain of the lumbar region and will consider treatment and the lumbar lower extremity region is well pending follow-up evaluation. The patient will continue oxycodone as prescribed and she is tolerating well without undesirable side effects. All agreed to suggested treatment plan      Review of Systems     Objective:   Physical Exam  There was tenderness to palpation of the paraspinal muscular region cervical region cervical facet region a moderate to moderately severe degree with limited range of motion of the cervical region of severe degree. There was increased pain with rotation lateral bending and extension and flexion. There appeared to be decreased grip strength with Tinel and Phalen's  maneuver reproducing minimal discomfort. There was tenderness to palpation over the region is 10 occipitalis musculature region a moderate degree as well. Palpation of the acromioclavicular and glenohumeral joint regions reproduces moderate discomfort and patient appeared to be unremarkable Spurling's maneuver. Palpation over the region of the thoracic facet thoracic paraspinal muscular region was with increased pain with palpation of the thoracic region without crepitus of the thoracic region noted. Palpation over the lumbar paraspinal misreading lumbar facet region associated with moderate discomfort with moderate tenderness over the lumbar facets with lateral bending rotation extension and palpation of the lumbar facets with moderate tenderness of the PSIS and PII S region and with moderate tends to palpation of the right knee with negative anterior and posterior drawer signs without ballottement of the patella. EHL strength appeared to be slightly decreased. No definite sensory deficit or dermatomal distribution detected. Negative clonus negative Homans. Abdomen nontender with no costovertebral maintenance noted.      Assessment & Plan:    Degenerative disc disease of the lumbar spine L3-4 and L4-5 degenerative changes with spinal canal stenosis  Lumbar stenosis with neurogenic claudication  Lumbar radiculopathy  Status post surgery of the right knee following trauma Degenerative joint disease of knee Complex regional pain syndrome of knee and lower extremity on the right  Degenerative disc disease of cervical spine  Bilateral occipital neuralgia       PLAN   Continue present medication oxycodone   F/U PCP Dr. Lucita Lora for evaliation of  BP and general medical  condition  F/U surgical evaluation. Asked the secretary and nurses the date of your appointment to see Dr. Hal Neer to evaluate  pain of neck and upper extremity as we discussed this morning. Since the neurosurgeon's  office has tried to call you you can call and schedule appointment for a time convenient for you. Please call and schedule your appointment with Dr. Hal Neer neurosurgeon if you wish to do such.  F/U neurological evaluation. May consider [facility] EMG studies and other studies pending follow-up evaluations  May consider radiofrequency rhizolysis or intraspinal procedures pending response to present treatment and F/U evaluation   Call prior to scheduled return appointment for any concerns you may have regarding your condition

## 2015-07-07 ENCOUNTER — Encounter: Payer: Self-pay | Admitting: Pain Medicine

## 2015-07-07 ENCOUNTER — Ambulatory Visit: Payer: Medicare HMO | Attending: Pain Medicine | Admitting: Pain Medicine

## 2015-07-07 VITALS — BP 137/81 | HR 65 | Temp 97.9°F | Resp 15 | Ht 68.0 in | Wt 155.0 lb

## 2015-07-07 DIAGNOSIS — Z981 Arthrodesis status: Secondary | ICD-10-CM

## 2015-07-07 DIAGNOSIS — M48062 Spinal stenosis, lumbar region with neurogenic claudication: Secondary | ICD-10-CM

## 2015-07-07 DIAGNOSIS — M5030 Other cervical disc degeneration, unspecified cervical region: Secondary | ICD-10-CM

## 2015-07-07 DIAGNOSIS — M5116 Intervertebral disc disorders with radiculopathy, lumbar region: Secondary | ICD-10-CM | POA: Insufficient documentation

## 2015-07-07 DIAGNOSIS — G90521 Complex regional pain syndrome I of right lower limb: Secondary | ICD-10-CM | POA: Diagnosis not present

## 2015-07-07 DIAGNOSIS — M5416 Radiculopathy, lumbar region: Secondary | ICD-10-CM

## 2015-07-07 DIAGNOSIS — M170 Bilateral primary osteoarthritis of knee: Secondary | ICD-10-CM

## 2015-07-07 DIAGNOSIS — M5481 Occipital neuralgia: Secondary | ICD-10-CM | POA: Diagnosis not present

## 2015-07-07 DIAGNOSIS — M179 Osteoarthritis of knee, unspecified: Secondary | ICD-10-CM | POA: Diagnosis not present

## 2015-07-07 DIAGNOSIS — G5700 Lesion of sciatic nerve, unspecified lower limb: Secondary | ICD-10-CM

## 2015-07-07 DIAGNOSIS — R51 Headache: Secondary | ICD-10-CM | POA: Diagnosis present

## 2015-07-07 DIAGNOSIS — M47896 Other spondylosis, lumbar region: Secondary | ICD-10-CM | POA: Insufficient documentation

## 2015-07-07 DIAGNOSIS — Z9889 Other specified postprocedural states: Secondary | ICD-10-CM | POA: Diagnosis not present

## 2015-07-07 DIAGNOSIS — M51369 Other intervertebral disc degeneration, lumbar region without mention of lumbar back pain or lower extremity pain: Secondary | ICD-10-CM

## 2015-07-07 DIAGNOSIS — M542 Cervicalgia: Secondary | ICD-10-CM | POA: Diagnosis present

## 2015-07-07 DIAGNOSIS — M4806 Spinal stenosis, lumbar region: Secondary | ICD-10-CM | POA: Insufficient documentation

## 2015-07-07 DIAGNOSIS — M5136 Other intervertebral disc degeneration, lumbar region: Secondary | ICD-10-CM

## 2015-07-07 MED ORDER — OXYCODONE HCL 10 MG PO TABS: ORAL_TABLET | ORAL | Status: DC

## 2015-07-07 NOTE — Patient Instructions (Signed)
  PLAN   Continue present medication oxycodone   F/U PCP Dr. Lucita Lora for evaliation of  BP and general medical  condition  F/U surgical evaluation. Asked the secretary and nurses the date of your appointment to see Dr. Hal Neer or other neurosurgeon to evaluate pain of neck and upper extremity as we discussed this morning. Since the neurosurgeon's office has tried to call you, you can call and schedule appointment for a time convenient for you. Please call and schedule your appointment with Dr. Hal Neer or other neurosurgeon if you wish to do such as we previously discussed  F/U neurological evaluation. May consider [facility] EMG studies and other studies pending follow-up evaluations  May consider radiofrequency rhizolysis or intraspinal procedures pending response to present treatment and F/U evaluation   Call prior to scheduled return appointment for any concerns you may have regarding your condition

## 2015-07-07 NOTE — Progress Notes (Signed)
Safety precautions to be maintained throughout the outpatient stay will include: orient to surroundings, keep bed in low position, maintain call bell within reach at all times, provide assistance with transfer out of bed and ambulation.  

## 2015-07-07 NOTE — Progress Notes (Signed)
Subjective:    Patient ID: LACARLOS RESPASS, male    DOB: 07/23/1940, 75 y.o.   MRN: [id number]  HPI  The patient is a 75 year old gentleman who returns to pain management for further evaluation and treatment of pain involving the region of the neck upper extremity region associated with headaches as well as pain involving the mid lower back and lower extremity regions. He underwent with neurosurgical reevaluation with Dr. Hal Neer and was prescribed a nonsteroidal anti-inflammatory medication.. The patient stated that Dr. Hal Neer stated that most of the patient's pain appeared to be due to arthritic changes of the spine. The patient stated that Dr. Hal Neer did not recommend patient for any surgery at the time. Dr. Hal Neer recommend patient continue treatment in pain management center at this time The patient notes some improvement of his symptoms with the use of the nonsteroidal anti-inflammatory medication. The patient will follow-up with another neurosurgeon since Dr. Hal Neer is retiring. The patient stated that his pain involving the neck upper extremity region and was associated with headaches as well as pain involving the lumbar lower extremity region. We have discussed interventional treatment and we'll remain available to consider patient for interventional treatment should patient wish to do such. At the present time we will continue present medication consisting of oxycodone and patient will continue nonsteroidal anti-inflammatory medication Relafen. Denies any trauma change in events of daily living the patient continues to perform most activities of daily living with out severely disabling pain interfering with activities of daily living. The patient also discussed his purchase of a recent motorcycle which was modified to have wheels . The patient is looking forward to riding the new vehicle. We remain available to consider modification of treatment as discussed with patient. All agreed to  suggested treatment plan  Review of Systems     Objective:   Physical Exam  There was tenderness to palpation of paraspinal muscular treat the cervical region cervical facet region palpation which reproduces pain of moderate degree with limited range of motion of the cervical spine and well-healed surgical scar of the cervical region without increased warmth and erythema in the region of the scar. There was limited range of motion of the cervical spine. There was tenderness of the splenius capitis and occipitalis region palpation which be produced moderate discomfort. Palpation of the acromioclavicular and glenohumeral joint regions reproduce moderate discomfort as well. The patient appeared to be with slightly decreased grip strength with Tinel and Phalen's maneuver reproducing mild discomfort. Palpation over the lumbar paraspinal musculature region lumbar facet region was attends to palpation of moderate degree with lateral bending rotation extension and palpation of the lumbar facets reproducing moderate discomfort. Palpation over the region of the PSIS and PII S region reproduced moderate discomfort straight leg raise was tolerates approximately 20 without increased pain with dorsiflexion noted. No sensory deficit or dermatomal dystrophy detected. EHL appeared to be decreased. The knees were with crepitus of the knees and tenderness to palpation of the knees left knee was more tender than the right knee with negative anterior and posterior drawer signs there was no definite allodynia of the lower extremities noted. There was negative clonus negative Homans. Abdomen was nontender with no costovertebral tenderness noted      Assessment & Plan:      Degenerative disc disease of the lumbar spine L3-4 and L4-5 degenerative changes with spinal canal stenosis  Lumbar stenosis with neurogenic claudication  Lumbar radiculopathy  Status post surgery of the right knee following  trauma Degenerative  joint disease of knee Complex regional pain syndrome of knee and lower extremity on the right  Degenerative disc disease of cervical spine  Bilateral occipital neuralgia      PLAN   Continue present medication oxycodone and Relafen  F/U PCP Dr. Lucita Lora for evaliation of  BP and general medical  condition  F/U surgical evaluation.. The patient is status post evaluation by Dr. Hal Neer. The patient will follow-up with another neurosurgeon as needed with Dr. Hal Neer is retiring  F/U neurological evaluation. May consider [facility] EMG studies and other studies pending follow-up evaluations  May consider radiofrequency rhizolysis or intraspinal procedures pending response to present treatment and F/U evaluation . At the present time we will avoid considering such treatment  The patient has been advised to adhere to proper body mechanics and to avoid activities which appear to aggravate condition.  Call prior to scheduled return appointment for any concerns you may have regarding your condition

## 2015-07-14 LAB — TOXASSURE SELECT 13 (MW), URINE: PDF: 0

## 2015-07-14 NOTE — Progress Notes (Signed)
Quick Note:  Reviewed. ______ 

## 2015-08-06 ENCOUNTER — Ambulatory Visit: Payer: Medicare HMO | Admitting: Pain Medicine

## 2015-08-07 ENCOUNTER — Encounter: Payer: Self-pay | Admitting: Pain Medicine

## 2015-08-07 ENCOUNTER — Ambulatory Visit: Payer: Medicare HMO | Attending: Pain Medicine | Admitting: Pain Medicine

## 2015-08-07 VITALS — BP 130/75 | HR 69 | Temp 98.0°F | Resp 16 | Ht 68.0 in | Wt 155.0 lb

## 2015-08-07 DIAGNOSIS — M542 Cervicalgia: Secondary | ICD-10-CM | POA: Diagnosis present

## 2015-08-07 DIAGNOSIS — G90521 Complex regional pain syndrome I of right lower limb: Secondary | ICD-10-CM | POA: Diagnosis not present

## 2015-08-07 DIAGNOSIS — M5136 Other intervertebral disc degeneration, lumbar region: Secondary | ICD-10-CM

## 2015-08-07 DIAGNOSIS — G5700 Lesion of sciatic nerve, unspecified lower limb: Secondary | ICD-10-CM

## 2015-08-07 DIAGNOSIS — M5030 Other cervical disc degeneration, unspecified cervical region: Secondary | ICD-10-CM

## 2015-08-07 DIAGNOSIS — M5481 Occipital neuralgia: Secondary | ICD-10-CM | POA: Diagnosis not present

## 2015-08-07 DIAGNOSIS — M79606 Pain in leg, unspecified: Secondary | ICD-10-CM | POA: Diagnosis present

## 2015-08-07 DIAGNOSIS — Z981 Arthrodesis status: Secondary | ICD-10-CM

## 2015-08-07 DIAGNOSIS — M179 Osteoarthritis of knee, unspecified: Secondary | ICD-10-CM | POA: Insufficient documentation

## 2015-08-07 DIAGNOSIS — M170 Bilateral primary osteoarthritis of knee: Secondary | ICD-10-CM

## 2015-08-07 DIAGNOSIS — M5416 Radiculopathy, lumbar region: Secondary | ICD-10-CM

## 2015-08-07 DIAGNOSIS — M4806 Spinal stenosis, lumbar region: Secondary | ICD-10-CM | POA: Diagnosis not present

## 2015-08-07 DIAGNOSIS — M47896 Other spondylosis, lumbar region: Secondary | ICD-10-CM | POA: Diagnosis not present

## 2015-08-07 DIAGNOSIS — Z9889 Other specified postprocedural states: Secondary | ICD-10-CM | POA: Diagnosis not present

## 2015-08-07 DIAGNOSIS — M545 Low back pain: Secondary | ICD-10-CM | POA: Diagnosis present

## 2015-08-07 DIAGNOSIS — M48062 Spinal stenosis, lumbar region with neurogenic claudication: Secondary | ICD-10-CM

## 2015-08-07 DIAGNOSIS — M5116 Intervertebral disc disorders with radiculopathy, lumbar region: Secondary | ICD-10-CM | POA: Insufficient documentation

## 2015-08-07 MED ORDER — NABUMETONE 500 MG PO TABS
ORAL_TABLET | ORAL | Status: DC
Start: 2015-08-07 — End: 2015-09-08

## 2015-08-07 MED ORDER — OXYCODONE HCL 10 MG PO TABS: ORAL_TABLET | ORAL | Status: DC

## 2015-08-07 NOTE — Progress Notes (Signed)
Safety precautions to be maintained throughout the outpatient stay will include: orient to surroundings, keep bed in low position, maintain call bell within reach at all times, provide assistance with transfer out of bed and ambulation.  

## 2015-08-07 NOTE — Progress Notes (Signed)
   Subjective:    Patient ID: [name] [name], male    DOB: August 17, 1940, 75 y.o.   MRN: [id number]  HPI   The patient is a 75 year old gentleman who returns to pain management for further evaluation and treatment of pain involving the region of the neck upper extremity regions mid back lower back and lower extremity region. The patient is undergone's neurosurgical reevaluation by Dr. Hal Neer. The patient states that he is without plans for additional surgery. The patient continues medications of oxycodone and Relafen. We will continue medications as prescribed and we will remain available to consider modifications of treatment pending follow-up evaluation. The patient denied any recent trauma. The patient stated that she recently lost his wife. We discussed patient's condition and advised patient to follow-up with primary care physician and to call pain management should there be any change in condition prior to scheduled return appointment. The patient was in agreement with suggested treatment plan. Continue present medications of oxycodone and Zanaflex and we will avoid interventional treatment at this time and we'll remain available to modify treatment regimen as felt to be necessary. All agreed to suggested treatment plan      Review of Systems     Objective:   Physical Exam  There was tenderness of the splenius capitis and occipitalis region a moderate degree with well-healed surgical scar of the cervical region without increased warmth and erythema in the region of the scar. Palpation of the thoracic facet region was attends to palpation without crepitus of the thoracic region noted. Palpation over the lumbar region lumbar facet region reproducing moderate discomfort with lateral bending rotation extension and palpation of the lumbar facets reproducing moderate discomfort. There was tenderness in the region of the PSIS and PII S region a moderate degree. Knees were attends to palpation with  crepitus of the knees with negative anterior and posterior drawer signs without ballottement of the patella. EHL strength appeared to be decreased. There was no definite sensory deficit or dermatomal distribution detected. There was negative clonus negative Homans. Abdomen nontender with no costovertebral angle tenderness noted      Assessment & Plan:    Degenerative disc disease of the lumbar spine L3-4 and L4-5 degenerative changes with spinal canal stenosis  Lumbar stenosis with neurogenic claudication  Lumbar radiculopathy  Status post surgery of the right knee following trauma Degenerative joint disease of knee Complex regional pain syndrome of knee and lower extremity on the right  Degenerative disc disease of cervical spine  Bilateral occipital neuralgia         PLAN   Continue present medication oxycodone and Relafen. CAUTION Relafen can damage liver and kidney and cause bleeding of the stomach  F/U with PCP  for evaliation of  BP and general medical  condition. Inform your PCP that you are taking Relafen as we discussed   F/U surgical evaluation as discussed.  F/U neurological evaluation. May consider [facility] EMG studies and other studies pending follow-up evaluations  May consider radiofrequency rhizolysis or intraspinal procedures pending response to present treatment and F/U evaluation   Call prior to scheduled return appointment for any concerns you may have regarding your condition

## 2015-08-07 NOTE — Patient Instructions (Addendum)
  PLAN   Continue present medication oxycodone and Relafen. CAUTION Relafen can damage liver and kidney and cause bleeding of the stomach  F/U with PCP  for evaliation of  BP and general medical  condition. Inform your PCP that you are taking Relafen as we discussed   F/U surgical evaluation as discussed.  F/U neurological evaluation. May consider [facility] EMG studies and other studies pending follow-up evaluations  May consider radiofrequency rhizolysis or intraspinal procedures pending response to present treatment and F/U evaluation   Call prior to scheduled return appointment for any concerns you may have regarding your condition

## 2015-09-08 ENCOUNTER — Encounter: Payer: Self-pay | Admitting: Pain Medicine

## 2015-09-08 ENCOUNTER — Ambulatory Visit: Payer: Medicare HMO | Attending: Pain Medicine | Admitting: Pain Medicine

## 2015-09-08 VITALS — BP 143/83 | HR 58 | Temp 98.0°F | Resp 12 | Ht 68.0 in | Wt 155.0 lb

## 2015-09-08 DIAGNOSIS — M4806 Spinal stenosis, lumbar region: Secondary | ICD-10-CM | POA: Insufficient documentation

## 2015-09-08 DIAGNOSIS — Z9889 Other specified postprocedural states: Secondary | ICD-10-CM | POA: Insufficient documentation

## 2015-09-08 DIAGNOSIS — M5030 Other cervical disc degeneration, unspecified cervical region: Secondary | ICD-10-CM

## 2015-09-08 DIAGNOSIS — G90521 Complex regional pain syndrome I of right lower limb: Secondary | ICD-10-CM | POA: Insufficient documentation

## 2015-09-08 DIAGNOSIS — M170 Bilateral primary osteoarthritis of knee: Secondary | ICD-10-CM

## 2015-09-08 DIAGNOSIS — R51 Headache: Secondary | ICD-10-CM | POA: Diagnosis not present

## 2015-09-08 DIAGNOSIS — M48062 Spinal stenosis, lumbar region with neurogenic claudication: Secondary | ICD-10-CM

## 2015-09-08 DIAGNOSIS — M5481 Occipital neuralgia: Secondary | ICD-10-CM

## 2015-09-08 DIAGNOSIS — M5116 Intervertebral disc disorders with radiculopathy, lumbar region: Secondary | ICD-10-CM | POA: Diagnosis not present

## 2015-09-08 DIAGNOSIS — M47896 Other spondylosis, lumbar region: Secondary | ICD-10-CM | POA: Insufficient documentation

## 2015-09-08 DIAGNOSIS — M179 Osteoarthritis of knee, unspecified: Secondary | ICD-10-CM | POA: Insufficient documentation

## 2015-09-08 DIAGNOSIS — M546 Pain in thoracic spine: Secondary | ICD-10-CM | POA: Diagnosis present

## 2015-09-08 DIAGNOSIS — M542 Cervicalgia: Secondary | ICD-10-CM | POA: Diagnosis present

## 2015-09-08 DIAGNOSIS — M5136 Other intervertebral disc degeneration, lumbar region: Secondary | ICD-10-CM

## 2015-09-08 MED ORDER — OXYCODONE HCL 10 MG PO TABS: ORAL_TABLET | ORAL | 0 refills | Status: DC

## 2015-09-08 MED ORDER — NABUMETONE 500 MG PO TABS: 500.0000 mg | ORAL_TABLET | Freq: Two times a day (BID) | ORAL | 0 refills | Status: DC

## 2015-09-08 NOTE — Progress Notes (Signed)
     The patient is a 75 year old gentleman who returns to pain management for further evaluation and treatment of pain involving the neck entire back upper and lower extremity regions. Patient also has complaint of headaches with pain of the neck radiating to the back of the head. The patient has significant pain involving the region of the knees as well. The patient was with multiple arthralgias and myalgias. We discussed patient's condition and patient expressed desire to proceed with interventional treatment at time return appointment.. We will proceed with lumbar epidural steroid injection to be performed at time return appointment. The patient denies any trauma change in events of daily living the call significant change in symptomatology. Patient states pain of the lower back region with severe pain increased with standing walking and becoming more intense as the day progresses. We will consider additional modifications of treatment regimen pending follow-up evaluation. The patient is undergone neurosurgical evaluation without recommendation for surgical intervention at the present time. All agreed to suggested treatment plan   Physical examination  There was tenderness of the splenius capitis and occipitalis region a moderate degree with moderate tenderness of the cervical facet cervical paraspinal musculature region. There were no bounding pulsations of the temporal region noted and no excessive tends to palpation of the region of sinus. The patient appeared to be with unremarkable Spurling's maneuver. There was tenderness of the paraspinal musculatures of the cervical region with well-healed scar of the cervical region without increased warmth and erythema in the region of the scar. The patient was with tenderness of the acromioclavicular and glenohumeral joint region a moderate degree and appeared to be with slightly decreased grip strength without increased pain with Tinel and Phalen's maneuver.  Palpation over the thoracic region was with moderate discomfort with lateral bending rotation extension and palpation of the lumbar facets reproducing moderately severe discomfort. No crepitus of the thoracic region was noted. Palpation over the PSIS and PII S region reproduced moderate discomfort. There was moderate tenderness of the knee noted with negative anterior and posterior drawer signs without ballottement of the patella with crepitus of the knee noted and without increased warmth and erythema in the region of the knee. There was questionably decreased sensation of the L5 dermatomal dystrophy with negative clonus negative Homans. DTRs were difficult to elicit. EHL strength appeared to be decreased. Abdomen nontender with no costovertebral tenderness noted    Assessment   Degenerative disc disease of the lumbar spine L3-4 and L4-5 degenerative changes with spinal canal stenosis  Lumbar stenosis with neurogenic claudication  Lumbar radiculopathy  Status post surgery of the right knee following trauma Degenerative joint disease of knee Complex regional pain syndrome of knee and lower extremity on the right  Degenerative disc disease of cervical spine      PLAN   Continue present medication oxycodone and Relafen. CAUTION Relafen can damage liver and kidney and cause bleeding of the stomach  Lumbar epidural steroid injection to be performed at time of return appointment  F/U with PCP  for evaliation of  BP and general medical  condition. Inform your PCP that you are taking Relafen as we discussed   F/U surgical evaluation as discussed.  F/U neurological evaluation. May consider [facility] EMG studies and other studies pending follow-up evaluations  May consider radiofrequency rhizolysis or intraspinal procedures pending response to present treatment and F/U evaluation   Call prior to scheduled return appointment for any concerns you may have regarding your condition

## 2015-09-08 NOTE — Patient Instructions (Addendum)
PLAN   Continue present medication oxycodone and Relafen. CAUTION Relafen can damage liver and kidney and cause bleeding of the stomach  Lumbar epidural steroid injection to be performed at time of return appointment  F/U with PCP  for evaliation of  BP and general medical  condition. Inform your PCP that you are taking Relafen as we discussed   F/U surgical evaluation as discussed.  F/U neurological evaluation. May consider [facility] EMG studies and other studies pending follow-up evaluations  May consider radiofrequency rhizolysis or intraspinal procedures pending response to present treatment and F/U evaluation   Call prior to scheduled return appointment for any concerns you may have regarding your conditionGENERAL RISKS AND COMPLICATIONS  What are the risk, side effects and possible complications? Generally speaking, most procedures are safe.  However, with any procedure there are risks, side effects, and the possibility of complications.  The risks and complications are dependent upon the sites that are lesioned, or the type of nerve block to be performed.  The closer the procedure is to the spine, the more serious the risks are.  Great care is taken when placing the radio frequency needles, block needles or lesioning probes, but sometimes complications can occur. 1. Infection: Any time there is an injection through the skin, there is a risk of infection.  This is why sterile conditions are used for these blocks.  There are four possible types of infection. 1. Localized skin infection. 2. Central Nervous System Infection-This can be in the form of Meningitis, which can be deadly. 3. Epidural Infections-This can be in the form of an epidural abscess, which can cause pressure inside of the spine, causing compression of the spinal cord with subsequent paralysis. This would require an emergency surgery to decompress, and there are no guarantees that the patient would recover from the  paralysis. 4. Discitis-This is an infection of the intervertebral discs.  It occurs in about 1% of discography procedures.  It is difficult to treat and it may lead to surgery.        2. Pain: the needles have to go through skin and soft tissues, will cause soreness.       3. Damage to internal structures:  The nerves to be lesioned may be near blood vessels or    other nerves which can be potentially damaged.       4. Bleeding: Bleeding is more common if the patient is taking blood thinners such as  aspirin, Coumadin, Ticiid, Plavix, etc., or if he/she have some genetic predisposition  such as hemophilia. Bleeding into the spinal canal can cause compression of the spinal  cord with subsequent paralysis.  This would require an emergency surgery to  decompress and there are no guarantees that the patient would recover from the  paralysis.       5. Pneumothorax:  Puncturing of a lung is a possibility, every time a needle is introduced in  the area of the chest or upper back.  Pneumothorax refers to free air around the  collapsed lung(s), inside of the thoracic cavity (chest cavity).  Another two possible  complications related to a similar event would include: Hemothorax and Chylothorax.   These are variations of the Pneumothorax, where instead of air around the collapsed  lung(s), you may have blood or chyle, respectively.       6. Spinal headaches: They may occur with any procedures in the area of the spine.       7. Persistent CSF (Cerebro-Spinal Fluid)  leakage: This is a rare problem, but may occur  with prolonged intrathecal or epidural catheters either due to the formation of a fistulous  track or a dural tear.       8. Nerve damage: By working so close to the spinal cord, there is always a possibility of  nerve damage, which could be as serious as a permanent spinal cord injury with  paralysis.       9. Death:  Although rare, severe deadly allergic reactions known as "Anaphylactic  reaction" can  occur to any of the medications used.      10. Worsening of the symptoms:  We can always make thing worse.  What are the chances of something like this happening? Chances of any of this occuring are extremely low.  By statistics, you have more of a chance of getting killed in a motor vehicle accident: while driving to the hospital than any of the above occurring .  Nevertheless, you should be aware that they are possibilities.  In general, it is similar to taking a shower.  Everybody knows that you can slip, hit your head and get killed.  Does that mean that you should not shower again?  Nevertheless always keep in mind that statistics do not mean anything if you happen to be on the wrong side of them.  Even if a procedure has a 1 (one) in a 1,000,000 (million) chance of going wrong, it you happen to be that one..Also, keep in mind that by statistics, you have more of a chance of having something go wrong when taking medications.  Who should not have this procedure? If you are on a blood thinning medication (e.g. Coumadin, Plavix, see list of "Blood Thinners"), or if you have an active infection going on, you should not have the procedure.  If you are taking any blood thinners, please inform your physician.  How should I prepare for this procedure?  Do not eat or drink anything at least six hours prior to the procedure.  Bring a driver with you .  It cannot be a taxi.  Come accompanied by an adult that can drive you back, and that is strong enough to help you if your legs get weak or numb from the local anesthetic.  Take all of your medicines the morning of the procedure with just enough water to swallow them.  If you have diabetes, make sure that you are scheduled to have your procedure done first thing in the morning, whenever possible.  If you have diabetes, take only half of your insulin dose and notify our nurse that you have done so as soon as you arrive at the clinic.  If you are  diabetic, but only take blood sugar pills (oral hypoglycemic), then do not take them on the morning of your procedure.  You may take them after you have had the procedure.  Do not take aspirin or any aspirin-containing medications, at least eleven (11) days prior to the procedure.  They may prolong bleeding.  Wear loose fitting clothing that may be easy to take off and that you would not mind if it got stained with Betadine or blood.  Do not wear any jewelry or perfume  Remove any nail coloring.  It will interfere with some of our monitoring equipment.  NOTE: Remember that this is not meant to be interpreted as a complete list of all possible complications.  Unforeseen problems may occur.  BLOOD THINNERS The following drugs contain aspirin  or other products, which can cause increased bleeding during surgery and should not be taken for 2 weeks prior to and 1 week after surgery.  If you should need take something for relief of minor pain, you may take acetaminophen which is found in Tylenol,m Datril, Anacin-3 and Panadol. It is not blood thinner. The products listed below are.  Do not take any of the products listed below in addition to any listed on your instruction sheet.  A.P.C or A.P.C with Codeine Codeine Phosphate Capsules #3 Ibuprofen Ridaura  ABC compound Congesprin Imuran rimadil  Advil Cope Indocin Robaxisal  Alka-Seltzer Effervescent Pain Reliever and Antacid Coricidin or Coricidin-D  Indomethacin Rufen  Alka-Seltzer plus Cold Medicine Cosprin Ketoprofen S-A-C Tablets  Anacin Analgesic Tablets or Capsules Coumadin Korlgesic Salflex  Anacin Extra Strength Analgesic tablets or capsules CP-2 Tablets Lanoril Salicylate  Anaprox Cuprimine Capsules Levenox Salocol  Anexsia-D Dalteparin Magan Salsalate  Anodynos Darvon compound Magnesium Salicylate Sine-off  Ansaid Dasin Capsules Magsal Sodium Salicylate  Anturane Depen Capsules Marnal Soma  APF Arthritis pain formula Dewitt's Pills  Measurin Stanback  Argesic Dia-Gesic Meclofenamic Sulfinpyrazone  Arthritis Bayer Timed Release Aspirin Diclofenac Meclomen Sulindac  Arthritis pain formula Anacin Dicumarol Medipren Supac  Analgesic (Safety coated) Arthralgen Diffunasal Mefanamic Suprofen  Arthritis Strength Bufferin Dihydrocodeine Mepro Compound Suprol  Arthropan liquid Dopirydamole Methcarbomol with Aspirin Synalgos  ASA tablets/Enseals Disalcid Micrainin Tagament  Ascriptin Doan's Midol Talwin  Ascriptin A/D Dolene Mobidin Tanderil  Ascriptin Extra Strength Dolobid Moblgesic Ticlid  Ascriptin with Codeine Doloprin or Doloprin with Codeine Momentum Tolectin  Asperbuf Duoprin Mono-gesic Trendar  Aspergum Duradyne Motrin or Motrin IB Triminicin  Aspirin plain, buffered or enteric coated Durasal Myochrisine Trigesic  Aspirin Suppositories Easprin Nalfon Trillsate  Aspirin with Codeine Ecotrin Regular or Extra Strength Naprosyn Uracel  Atromid-S Efficin Naproxen Ursinus  Auranofin Capsules Elmiron Neocylate Vanquish  Axotal Emagrin Norgesic Verin  Azathioprine Empirin or Empirin with Codeine Normiflo Vitamin E  Azolid Emprazil Nuprin Voltaren  Bayer Aspirin plain, buffered or children's or timed BC Tablets or powders Encaprin Orgaran Warfarin Sodium  Buff-a-Comp Enoxaparin Orudis Zorpin  Buff-a-Comp with Codeine Equegesic Os-Cal-Gesic   Buffaprin Excedrin plain, buffered or Extra Strength Oxalid   Bufferin Arthritis Strength Feldene Oxphenbutazone   Bufferin plain or Extra Strength Feldene Capsules Oxycodone with Aspirin   Bufferin with Codeine Fenoprofen Fenoprofen Pabalate or Pabalate-SF   Buffets II Flogesic Panagesic   Buffinol plain or Extra Strength Florinal or Florinal with Codeine Panwarfarin   Buf-Tabs Flurbiprofen Penicillamine   Butalbital Compound Four-way cold tablets Penicillin   Butazolidin Fragmin Pepto-Bismol   Carbenicillin Geminisyn Percodan   Carna Arthritis Reliever Geopen Persantine    Carprofen Gold's salt Persistin   Chloramphenicol Goody's Phenylbutazone   Chloromycetin Haltrain Piroxlcam   Clmetidine heparin Plaquenil   Cllnoril Hyco-pap Ponstel   Clofibrate Hydroxy chloroquine Propoxyphen         Before stopping any of these medications, be sure to consult the physician who ordered them.  Some, such as Coumadin (Warfarin) are ordered to prevent or treat serious conditions such as "deep thrombosis", "pumonary embolisms", and other heart problems.  The amount of time that you may need off of the medication may also vary with the medication and the reason for which you were taking it.  If you are taking any of these medications, please make sure you notify your pain physician before you undergo any procedures.         Epidural Steroid Injection An epidural steroid injection is  given to relieve pain in your neck, back, or legs that is caused by the irritation or swelling of a nerve root. This procedure involves injecting a steroid and numbing medicine (anesthetic) into the epidural space. The epidural space is the space between the outer covering of your spinal cord and the bones that form your backbone (vertebra).  LET [facility] CARE PROVIDER KNOW ABOUT:   Any allergies you have.  All medicines you are taking, including vitamins, herbs, eye drops, creams, and over-the-counter medicines such as aspirin.  Previous problems you or members of your family have had with the use of anesthetics.  Any blood disorders or blood clotting disorders you have.  Previous surgeries you have had.  Medical conditions you have. RISKS AND COMPLICATIONS Generally, this is a safe procedure. However, as with any procedure, complications can occur. Possible complications of epidural steroid injection include:  Headache.  Bleeding.  Infection.  Allergic reaction to the medicines.  Damage to your nerves. The response to this procedure depends on the underlying cause of the  pain and its duration. People who have long-term (chronic) pain are less likely to benefit from epidural steroids than are those people whose pain comes on strong and suddenly. BEFORE THE PROCEDURE   Ask your health care provider about changing or stopping your regular medicines. You may be advised to stop taking blood-thinning medicines a few days before the procedure.  You may be given medicines to reduce anxiety.  Arrange for someone to take you home after the procedure. PROCEDURE   You will remain awake during the procedure. You may receive medicine to make you relaxed.  You will be asked to lie on your stomach.  The injection site will be cleaned.  The injection site will be numbed with a medicine (local anesthetic).  A needle will be injected through your skin into the epidural space.  Your health care provider will use an X-ray machine to ensure that the steroid is delivered closest to the affected nerve. You may have minimal discomfort at this time.  Once the needle is in the right position, the local anesthetic and the steroid will be injected into the epidural space.  The needle will then be removed and a bandage will be applied to the injection site. AFTER THE PROCEDURE   You may be monitored for a short time before you go home.  You may feel weakness or numbness in your arm or leg, which disappears within hours.  You may be allowed to eat, drink, and take your regular medicine.  You may have soreness at the site of the injection.   This information is not intended to replace advice given to you by your health care provider. Make sure you discuss any questions you have with your health care provider.   Document Released: 04/26/2007 Document Revised: 09/19/2012 Document Reviewed: 07/06/2012 Elsevier Interactive Patient Education [employer].

## 2015-09-16 ENCOUNTER — Other Ambulatory Visit: Payer: Self-pay | Admitting: Pain Medicine

## 2015-09-16 ENCOUNTER — Ambulatory Visit: Payer: Medicare HMO | Admitting: Pain Medicine

## 2015-09-23 ENCOUNTER — Encounter: Payer: Self-pay | Admitting: Pain Medicine

## 2015-09-23 ENCOUNTER — Ambulatory Visit: Payer: Medicare HMO | Attending: Pain Medicine | Admitting: Pain Medicine

## 2015-09-23 VITALS — BP 143/82 | HR 54 | Temp 96.6°F | Resp 14 | Ht 68.0 in | Wt 155.0 lb

## 2015-09-23 DIAGNOSIS — M545 Low back pain: Secondary | ICD-10-CM | POA: Diagnosis present

## 2015-09-23 DIAGNOSIS — M170 Bilateral primary osteoarthritis of knee: Secondary | ICD-10-CM

## 2015-09-23 DIAGNOSIS — M47816 Spondylosis without myelopathy or radiculopathy, lumbar region: Secondary | ICD-10-CM | POA: Diagnosis not present

## 2015-09-23 DIAGNOSIS — M4806 Spinal stenosis, lumbar region: Secondary | ICD-10-CM | POA: Insufficient documentation

## 2015-09-23 DIAGNOSIS — M5136 Other intervertebral disc degeneration, lumbar region: Secondary | ICD-10-CM | POA: Diagnosis not present

## 2015-09-23 DIAGNOSIS — M5481 Occipital neuralgia: Secondary | ICD-10-CM

## 2015-09-23 DIAGNOSIS — M48062 Spinal stenosis, lumbar region with neurogenic claudication: Secondary | ICD-10-CM

## 2015-09-23 DIAGNOSIS — M79606 Pain in leg, unspecified: Secondary | ICD-10-CM | POA: Diagnosis present

## 2015-09-23 DIAGNOSIS — M5030 Other cervical disc degeneration, unspecified cervical region: Secondary | ICD-10-CM

## 2015-09-23 MED ORDER — FENTANYL CITRATE (PF) 100 MCG/2ML IJ SOLN
100.0000 ug | Freq: Once | INTRAMUSCULAR | Status: AC
  Administered 2015-09-23: 100 ug via INTRAVENOUS
  Filled 2015-09-23: qty 2

## 2015-09-23 MED ORDER — SODIUM CHLORIDE 0.9 % IJ SOLN
INTRAMUSCULAR | Status: AC
  Filled 2015-09-23: qty 10

## 2015-09-23 MED ORDER — CEFUROXIME AXETIL 250 MG PO TABS: 250.0000 mg | ORAL_TABLET | Freq: Two times a day (BID) | ORAL | 0 refills | Status: DC

## 2015-09-23 MED ORDER — CEFAZOLIN IN D5W 1 GM/50ML IV SOLN: 1.0000 g | Freq: Once | INTRAVENOUS | Status: DC

## 2015-09-23 MED ORDER — MIDAZOLAM HCL 5 MG/5ML IJ SOLN
5.0000 mg | Freq: Once | INTRAMUSCULAR | Status: AC
  Administered 2015-09-23: 2 mg via INTRAVENOUS
  Filled 2015-09-23: qty 5

## 2015-09-23 MED ORDER — TRIAMCINOLONE ACETONIDE 40 MG/ML IJ SUSP
40.0000 mg | Freq: Once | INTRAMUSCULAR | Status: AC
  Administered 2015-09-23: 40 mg
  Filled 2015-09-23: qty 1

## 2015-09-23 MED ORDER — CEFAZOLIN SODIUM 1 G IJ SOLR
INTRAMUSCULAR | Status: AC
  Administered 2015-09-23: 1 g via INTRAVENOUS
  Filled 2015-09-23: qty 10

## 2015-09-23 MED ORDER — SODIUM CHLORIDE 0.9% FLUSH
20.0000 mL | Freq: Once | INTRAVENOUS | Status: AC
  Administered 2015-09-23: 20 mL

## 2015-09-23 MED ORDER — BUPIVACAINE HCL (PF) 0.25 % IJ SOLN
30.0000 mL | Freq: Once | INTRAMUSCULAR | Status: AC
  Administered 2015-09-23: 30 mL
  Filled 2015-09-23: qty 30

## 2015-09-23 MED ORDER — LIDOCAINE HCL (PF) 1 % IJ SOLN
10.0000 mL | Freq: Once | INTRAMUSCULAR | Status: AC
  Administered 2015-09-23: 10 mL via SUBCUTANEOUS
  Filled 2015-09-23: qty 10

## 2015-09-23 MED ORDER — SODIUM CHLORIDE 0.9 % IJ SOLN
INTRAMUSCULAR | Status: AC
  Administered 2015-09-23: 11:00:00
  Filled 2015-09-23: qty 10

## 2015-09-23 MED ORDER — LACTATED RINGERS IV SOLN
1000.0000 mL | INTRAVENOUS | Status: DC
  Administered 2015-09-23: 1000 mL via INTRAVENOUS

## 2015-09-23 MED ORDER — ORPHENADRINE CITRATE 30 MG/ML IJ SOLN
60.0000 mg | Freq: Once | INTRAMUSCULAR | Status: AC
  Administered 2015-09-23: 60 mg via INTRAMUSCULAR
  Filled 2015-09-23: qty 2

## 2015-09-23 NOTE — Progress Notes (Signed)
     PROCEDURE PERFORMED: Lumbar epidural steroid injection   NOTE: The patient is a 75 y.o. male who returns to [facility] for further evaluation and treatment of pain involving the lumbar and lower extremity region. MRI revealed the patient to be with degenerative disc disease of the lumbar spine L3-4 and L4-5 degenerative changes with spinal canal stenosis. There is concern regarding patient's pain being due to component of lumbar stenosis with neurogenic claudication and lumbar radiculopathy The risks, benefits, and expectations of the procedure have been discussed and explained to the patient who was understanding and in agreement with suggested treatment plan. We will proceed with lumbar epidural steroid injection as discussed and as explained to the patient who is willing to proceed with procedure as planned.   DESCRIPTION OF PROCEDURE: Lumbar epidural steroid injection with IV Versed, IV fentanyl conscious sedation, EKG, blood pressure, pulse, capnography, and pulse oximetry monitoring. The procedure was performed with the patient in the prone position under fluoroscopic guidance. A local anesthetic skin wheal of 1.5% plain lidocaine was accomplished at proposed entry site. An 18-gauge Tuohy epidural needle was inserted at the L 3 vertebral body level left of the midline via loss-of-resistance technique with negative heme and negative CSF return. A total of 4 mL of Preservative-Free normal saline with 40 mg of Kenalog injected incrementally via epidurally placed needle. Needle was removed.    Myoneural block injections of the lumbar paraspinal musculature region Following Betadine prep of proposed entry site a 22-gauge needle was inserted into the lumbar paraspinal musculature region and following negative aspiration 2 cc of 0.25% bupivacaine with Norflex was injected for myoneural block injection of the lumbar paraspinal musculature region 4   A total of 40 mg of Kenalog was  utilized for the procedure.   The patient tolerated the injection well.    PLAN:   1. Medications: We will continue presently prescribed medication oxycodone   2. Will consider modification of treatment regimen pending response to treatment rendered on today's visit and follow-up evaluation. 3. The patient is to follow-up with primary care physician   Dr. Debbora Dus regarding blood pressure and general medical condition status post lumbar epidural steroid injection performed on today's visit. 4. Surgical evaluation. Patient previously under the care of neurosurgeon Dr. Hal Neer will follow-up with another neurosurgeon as discussed  5. Neurological evaluation. May consider [facility] EMG studies and other studies  6. The patient may be a candidate for radiofrequency procedures, implantation device, and other treatment pending response to treatment and follow-up evaluation. 7. The patient has been advised to adhere to proper body mechanics and avoid activities which appear to aggravate condition. 8. The patient has been advised to call the [facility] prior to scheduled return appointment should there be significant change in condition or should there be sign  The patient is understanding and agrees with the suggested  treatment plan

## 2015-09-23 NOTE — Patient Instructions (Addendum)
PLAN   Continue present medication oxycodone and Relafen. CAUTION Relafen can damage liver and kidney and cause bleeding of the stomach Please obtain Ceftin antibiotic today and begin taking Ceftin antibiotic today as prescribed  F/U with PCP  for evaliation of  BP and general medical  condition. Inform your PCP that you are taking Relafen as we previously discussed   F/U surgical evaluation as discussed.. Patient will undergo follow-up neurosurgical evaluation as discussed  F/U neurological evaluation. May consider [facility] EMG studies and other studies pending follow-up evaluations  May consider radiofrequency rhizolysis or intraspinal procedures pending response to present treatment and F/U evaluation   Call prior to scheduled return appointment for any concerns you may have regarding your condition  Epidural Steroid Injection Patient Information  Description: The epidural space surrounds the nerves as they exit the spinal cord.  In some patients, the nerves can be compressed and inflamed by a bulging disc or a tight spinal canal (spinal stenosis).  By injecting steroids into the epidural space, we can bring irritated nerves into direct contact with a potentially helpful medication.  These steroids act directly on the irritated nerves and can reduce swelling and inflammation which often leads to decreased pain.  Epidural steroids may be injected anywhere along the spine and from the neck to the low back depending upon the location of your pain.   After numbing the skin with local anesthetic (like Novocaine), a small needle is passed into the epidural space slowly.  You may experience a sensation of pressure while this is being done.  The entire block usually last less than 10 minutes.  Conditions which may be treated by epidural steroids:   Low back and leg pain  Neck and arm pain  Spinal stenosis  Post-laminectomy syndrome  Herpes zoster (shingles) pain  Pain from compression  fractures  Preparation for the injection:  1. Do not eat any solid food or dairy products within 8 hours of your appointment.  2. You may drink clear liquids up to 3 hours before appointment.  Clear liquids include water, black coffee, juice or soda.  No milk or cream please. 3. You may take your regular medication, including pain medications, with a sip of water before your appointment  Diabetics should hold regular insulin (if taken separately) and take 1/2 normal NPH dos the morning of the procedure.  Carry some sugar containing items with you to your appointment. 4. A driver must accompany you and be prepared to drive you home after your procedure.  5. Bring all your current medications with your. 6. An IV may be inserted and sedation may be given at the discretion of the physician.   7. A blood pressure cuff, EKG and other monitors will often be applied during the procedure.  Some patients may need to have extra oxygen administered for a short period. 8. You will be asked to provide medical information, including your allergies, prior to the procedure.  We must know immediately if you are taking blood thinners (like Coumadin/Warfarin)  Or if you are allergic to IV iodine contrast (dye). We must know if you could possible be pregnant.  Possible side-effects:  Bleeding from needle site  Infection (rare, may require surgery)  Nerve injury (rare)  Numbness & tingling (temporary)  Difficulty urinating (rare, temporary)  Spinal headache ( a headache worse with upright posture)  Light -headedness (temporary)  Pain at injection site (several days)  Decreased blood pressure (temporary)  Weakness in arm/leg (temporary)  Pressure  sensation in back/neck (temporary)  Call if you experience:  Fever/chills associated with headache or increased back/neck pain.  Headache worsened by an upright position.  New onset weakness or numbness of an extremity below the injection site  Hives  or difficulty breathing (go to the emergency room)  Inflammation or drainage at the infection site  Severe back/neck pain  Any new symptoms which are concerning to you  Please note:  Although the local anesthetic injected can often make your back or neck feel good for several hours after the injection, the pain will likely return.  It takes 3-7 days for steroids to work in the epidural space.  You may not notice any pain relief for at least that one week.  If effective, we will often do a series of three injections spaced 3-6 weeks apart to maximally decrease your pain.  After the initial series, we generally will wait several months before considering a repeat injection of the same type.  If you have any questions, please call [phone] [facility]  Pain Management Discharge Instructions  General Discharge Instructions :  If you need to reach your doctor call: Monday-Friday 8:00 am - 4:00 pm at [phone] or toll free [phone].  After clinic hours [phone] to have operator reach doctor.  Bring all of your medication bottles to all your appointments in the pain clinic.  To cancel or reschedule your appointment with Pain Management please remember to call 24 hours in advance to avoid a fee.  Refer to the educational materials which you have been given on: General Risks, I had my Procedure. Discharge Instructions, Post Sedation.  Post Procedure Instructions:  The drugs you were given will stay in your system until tomorrow, so for the next 24 hours you should not drive, make any legal decisions or drink any alcoholic beverages.  You may eat anything you prefer, but it is better to start with liquids then soups and crackers, and gradually work up to solid foods.  Please notify your doctor immediately if you have any unusual bleeding, trouble breathing or pain that is not related to your normal pain.  Depending on the type of  procedure that was done, some parts of your body may feel week and/or numb.  This usually clears up by tonight or the next day.  Walk with the use of an assistive device or accompanied by an adult for the 24 hours.  You may use ice on the affected area for the first 24 hours.  Put ice in a Ziploc bag and cover with a towel and place against area 15 minutes on 15 minutes off.  You may switch to heat after 24 hours.

## 2015-09-23 NOTE — Progress Notes (Signed)
Safety precautions to be maintained throughout the outpatient stay will include: orient to surroundings, keep bed in low position, maintain call bell within reach at all times, provide assistance with transfer out of bed and ambulation.  

## 2015-09-24 ENCOUNTER — Telehealth: Payer: Self-pay

## 2015-09-24 NOTE — Telephone Encounter (Signed)
Denies any needs or concerns at this time. He does say he is a little stiff but is OK.

## 2015-09-29 ENCOUNTER — Telehealth: Payer: Self-pay | Admitting: *Deleted

## 2015-10-06 ENCOUNTER — Ambulatory Visit: Payer: Medicare HMO | Admitting: Pain Medicine

## 2015-10-07 ENCOUNTER — Encounter: Payer: Self-pay | Admitting: Pain Medicine

## 2015-10-07 ENCOUNTER — Ambulatory Visit: Payer: Medicare HMO | Attending: Pain Medicine | Admitting: Pain Medicine

## 2015-10-07 VITALS — BP 183/74 | HR 47 | Temp 97.6°F | Resp 16 | Ht 68.0 in | Wt 158.0 lb

## 2015-10-07 DIAGNOSIS — M5481 Occipital neuralgia: Secondary | ICD-10-CM

## 2015-10-07 DIAGNOSIS — M4806 Spinal stenosis, lumbar region: Secondary | ICD-10-CM | POA: Insufficient documentation

## 2015-10-07 DIAGNOSIS — M47816 Spondylosis without myelopathy or radiculopathy, lumbar region: Secondary | ICD-10-CM | POA: Insufficient documentation

## 2015-10-07 DIAGNOSIS — Z9889 Other specified postprocedural states: Secondary | ICD-10-CM | POA: Insufficient documentation

## 2015-10-07 DIAGNOSIS — M5030 Other cervical disc degeneration, unspecified cervical region: Secondary | ICD-10-CM | POA: Diagnosis not present

## 2015-10-07 DIAGNOSIS — M545 Low back pain: Secondary | ICD-10-CM | POA: Diagnosis present

## 2015-10-07 DIAGNOSIS — M48062 Spinal stenosis, lumbar region with neurogenic claudication: Secondary | ICD-10-CM

## 2015-10-07 DIAGNOSIS — M170 Bilateral primary osteoarthritis of knee: Secondary | ICD-10-CM

## 2015-10-07 DIAGNOSIS — M5136 Other intervertebral disc degeneration, lumbar region: Secondary | ICD-10-CM

## 2015-10-07 DIAGNOSIS — G90521 Complex regional pain syndrome I of right lower limb: Secondary | ICD-10-CM | POA: Diagnosis not present

## 2015-10-07 DIAGNOSIS — M5116 Intervertebral disc disorders with radiculopathy, lumbar region: Secondary | ICD-10-CM | POA: Insufficient documentation

## 2015-10-07 DIAGNOSIS — M179 Osteoarthritis of knee, unspecified: Secondary | ICD-10-CM | POA: Insufficient documentation

## 2015-10-07 DIAGNOSIS — R51 Headache: Secondary | ICD-10-CM | POA: Insufficient documentation

## 2015-10-07 DIAGNOSIS — M542 Cervicalgia: Secondary | ICD-10-CM | POA: Diagnosis present

## 2015-10-07 DIAGNOSIS — M546 Pain in thoracic spine: Secondary | ICD-10-CM | POA: Diagnosis present

## 2015-10-07 MED ORDER — OXYCODONE HCL 10 MG PO TABS: ORAL_TABLET | ORAL | 0 refills | Status: DC

## 2015-10-07 NOTE — Patient Instructions (Signed)
  PLAN   Continue present medication oxycodone and Relafen. CAUTION Relafen can damage liver and kidney and cause bleeding of the stomach  F/U with PCP  for evaliation of  BP and general medical  condition. Inform your PCP that you are taking Relafen as we discussed   F/U surgical evaluation as discussed.  F/U neurological evaluation. May consider [facility] EMG studies and other studies pending follow-up evaluations  May consider radiofrequency rhizolysis or intraspinal procedures pending response to present treatment and F/U evaluation   Call prior to scheduled return appointment for any concerns you may have regarding your condition

## 2015-10-07 NOTE — Progress Notes (Signed)
The patient is a 75 year old gentleman who returns to pain management for further evaluation and treatment of pain involving the neck upper back mid lower back lower extremity regions. The patient has improvement of pain following lumbar epidural steroid injection. We have discussed additional interventional treatment including consideration for caudal epidural steroid injection. Patient states pain is aggravated with activity on the feet and pain increased with sitting as well. We have discussed interventional treatment which we will avoid at this time and we will remain available to consider additional interventional treatment pending orthopedic evaluation of pain of the right knee as well as surgical evaluation as discussed with patient. All agreed with suggested treatment plan.     Physical examination  There was tenderness of the paraspinal muscular region cervical region cervical facet region palpation which be produced pain of moderate degree with limited range of motion of the cervical spine and with well-healed surgical scar of the cervical region. The patient appeared to be with unremarkable Spurling's maneuver and unremarkable drop test. Palpation over the thoracic paraspinal musculature region was with minimal increased pain. Palpation over the lumbar paraspinal musculature region lumbar facet region was with moderately severe discomfort with lateral bending rotation extension and palpation over the lumbar facets reproducing moderate discomfort. Right knee was attends to palpation with well-healed surgical scar of the right knee without increased warmth or erythema of the right knee is noted. There were well-healed surgical scars of the right knee without increased warmth and erythema in the region scar. Palpation of the gluteal and piriformis musculature region reproduced moderate discomfort. We will proceed with scheduling patient for lumbosacral selective nerve root block at time of  return appointment as well as consider modification of other aspects of patient's treatment         Assessment        The patient is a 75 year old gentleman who returns to pain management for further evaluation and treatment of pain involving the neck entire back upper and lower extremity regions. Patient also has complaint of headaches with pain of the neck radiating to the back of the head. The patient has significant pain involving the region of the knees as well. The patient was with multiple arthralgias and myalgias. We discussed patient's condition and patient expressed desire to proceed with interventional treatment at time return appointment.. We will proceed with lumbar epidural steroid injection to be performed at time return appointment. The patient denies any trauma change in events of daily living the call significant change in symptomatology. Patient states pain of the lower back region with severe pain increased with standing walking and becoming more intense as the day progresses. We will consider additional modifications of treatment regimen pending follow-up evaluation. The patient is undergone neurosurgical evaluation without recommendation for surgical intervention at the present time. All agreed to suggested treatment plan   Physical examination  There was tenderness of the splenius capitis and occipitalis region a moderate degree with moderate tenderness of the cervical facet cervical paraspinal musculature region. There were no bounding pulsations of the temporal region noted and no excessive tends to palpation of the region of sinus. The patient appeared to be with unremarkable Spurling's maneuver. There was tenderness of the paraspinal musculatures of the cervical region with well-healed scar of the cervical region without increased warmth and erythema in the region of the scar. The patient was with tenderness of the acromioclavicular and glenohumeral joint region a  moderate degree and appeared to be with  slightly decreased grip strength without increased pain with Tinel and Phalen's maneuver. Palpation over the thoracic region was with moderate discomfort with lateral bending rotation extension and palpation of the lumbar facets reproducing moderately severe discomfort. No crepitus of the thoracic region was noted. Palpation over the PSIS and PII S region reproduced moderate discomfort. There was moderate tenderness of the knee noted with negative anterior and posterior drawer signs without ballottement of the patella with crepitus of the knee noted and without increased warmth and erythema in the region of the knee. There was questionably decreased sensation of the L5 dermatomal dystrophy with negative clonus negative Homans. DTRs were difficult to elicit. EHL strength appeared to be decreased. Abdomen nontender with no costovertebral tenderness noted    Assessment   Degenerative disc disease of the lumbar spine L3-4 and L4-5 degenerative changes with spinal canal stenosis  Lumbar stenosis with neurogenic claudication  Lumbar radiculopathy  Status post surgery of the right knee following trauma Degenerative joint disease of knee Complex regional pain syndrome of knee and lower extremity on the right  Degenerative disc disease of cervical spine      PLAN   Continue present medication oxycodone and Relafen. CAUTION Relafen can damage liver and kidney and cause bleeding of the stomach  Lumbar epidural steroid injection to be performed at time of return appointment  F/U with PCP  for evaliation of  BP and general medical  condition. Inform your PCP that you are taking Relafen as we discussed   F/U surgical evaluation as discussed.  F/U neurological evaluation. May consider [facility] EMG studies and other studies pending follow-up evaluations  May consider radiofrequency rhizolysis or intraspinal procedures pending response to present treatment and  F/U evaluation   Call prior to scheduled return appointment for any concerns you may have regarding your condition

## 2015-10-07 NOTE — Progress Notes (Signed)
Patient here for medication management.  Patient states that he was prescribed antibiotic at his last procedure and he did not want to take that kind of medicine.    Patient is currently having more trouble with his left knee than anything else.   Safety precautions to be maintained throughout the outpatient stay will include: orient to surroundings, keep bed in low position, maintain call bell within reach at all times, provide assistance with transfer out of bed and ambulation.

## 2015-10-27 ENCOUNTER — Emergency Department: Payer: Medicare HMO

## 2015-10-27 ENCOUNTER — Encounter: Payer: Self-pay | Admitting: Emergency Medicine

## 2015-10-27 ENCOUNTER — Emergency Department
Admission: EM | Admit: 2015-10-27 | Discharge: 2015-10-27 | Disposition: A | Discharge status: Home / Self Care | Payer: Medicare HMO | Attending: Emergency Medicine | Admitting: Emergency Medicine

## 2015-10-27 DIAGNOSIS — R509 Fever, unspecified: Secondary | ICD-10-CM | POA: Diagnosis present

## 2015-10-27 DIAGNOSIS — F17210 Nicotine dependence, cigarettes, uncomplicated: Secondary | ICD-10-CM | POA: Insufficient documentation

## 2015-10-27 DIAGNOSIS — J189 Pneumonia, unspecified organism: Secondary | ICD-10-CM | POA: Diagnosis not present

## 2015-10-27 LAB — BASIC METABOLIC PANEL
Anion gap: 8 (ref 5–15)
BUN: 13 mg/dL (ref 6–20)
CO2: 23 mmol/L (ref 22–32)
Calcium: 8.4 mg/dL — ABNORMAL LOW (ref 8.9–10.3)
Chloride: 101 mmol/L (ref 101–111)
Creatinine, Ser: 1.03 mg/dL (ref 0.61–1.24)
GFR calc Af Amer: >60 mL/min (ref >60)
GFR calc non Af Amer: >60 mL/min (ref >60)
Glucose, Bld: 125 mg/dL — ABNORMAL HIGH (ref 65–99)
Potassium: 4.3 mmol/L (ref 3.5–5.1)
Sodium: 132 mmol/L — ABNORMAL LOW (ref 135–145)

## 2015-10-27 LAB — CBC
HCT: 44.2 % (ref 40.0–52.0)
Hemoglobin: 14.4 g/dL (ref 13.0–18.0)
MCH: 26.5 pg (ref 26.0–34.0)
MCHC: 32.5 g/dL (ref 32.0–36.0)
MCV: 81.5 fL (ref 80.0–100.0)
Platelets: 171 10*3/uL (ref 150–440)
RBC: 5.43 MIL/uL (ref 4.40–5.90)
RDW: 14.9 % — ABNORMAL HIGH (ref 11.5–14.5)
WBC: 15.5 10*3/uL — ABNORMAL HIGH (ref 3.8–10.6)

## 2015-10-27 LAB — URINALYSIS COMPLETE WITH MICROSCOPIC (ARMC ONLY)
Bacteria, UA: NONE SEEN
Bilirubin Urine: NEGATIVE
Glucose, UA: NEGATIVE mg/dL
Hgb urine dipstick: NEGATIVE
Ketones, ur: NEGATIVE mg/dL
Leukocytes, UA: NEGATIVE
Nitrite: NEGATIVE
Protein, ur: 30 mg/dL — AB
Specific Gravity, Urine: 1.019 (ref 1.005–1.030)
pH: 8 (ref 5.0–8.0)

## 2015-10-27 MED ORDER — LEVOFLOXACIN 750 MG PO TABS
750.0000 mg | ORAL_TABLET | Freq: Once | ORAL | Status: AC
  Administered 2015-10-27: 750 mg via ORAL
  Filled 2015-10-27: qty 1

## 2015-10-27 MED ORDER — LEVOFLOXACIN 750 MG PO TABS: 750.0000 mg | ORAL_TABLET | Freq: Every day | ORAL | 0 refills | Status: AC

## 2015-10-27 MED ORDER — HYDROMORPHONE HCL 1 MG/ML IJ SOLN: 1.0000 mg | Freq: Once | INTRAMUSCULAR | Status: DC

## 2015-10-27 NOTE — ED Provider Notes (Signed)
[facility] Emergency Department Provider Note  ____________________________________________  Time seen: Approximately 7:27 PM  I have reviewed the triage vital signs and the nursing notes.   HISTORY  Chief Complaint Weakness   HPI ALIAS [name] [name] is a 75 y.o. male history of spinal stenosis and DJD who presents for evaluation of body aches, fever and cough productive of yellow sputum. Patient reports that the symptoms have been going on for 3 days. He is taking oxycodone at home that he usually takes for his back pain which helped some this morning but is still having body aches. He feels like is coming down with the flu. He has had no shortness of breath or chest pain, no abdominal pain. He endorses nausea but no vomiting, no diarrhea, no dysuria. Had subjective fevers last night. She has a history of chronic back pain and reports that that is much worse since his been sick for the last 3 days. He denies saddle anesthesia, weakness or numbness of his extremities, any recent injections in his back.  Past Medical History:  Diagnosis Date  . Renal disorder    kidney stones    Patient Active Problem List   Diagnosis Date Noted  . DDD (degenerative disc disease), lumbar 07/07/2014  . DDD (degenerative disc disease), cervical 07/07/2014  . DJD (degenerative joint disease) of knee 07/07/2014  . Bilateral occipital neuralgia 07/07/2014  . Spinal stenosis, lumbar region, with neurogenic claudication 07/07/2014    Past Surgical History:  Procedure Laterality Date  . INGUINAL HERNIA REPAIR    . KNEE SURGERY    . NECK SURGERY      Prior to Admission medications   Medication Sig Start Date End Date Taking? Authorizing Provider  albuterol (PROVENTIL HFA;VENTOLIN HFA) 108 (90 BASE) MCG/ACT inhaler Inhale 2 puffs into the lungs every 6 (six) hours as needed for wheezing or shortness of breath. Patient not taking: Reported on 09/08/2015 07/20/14   [name], PA-C  cefUROXime (CEFTIN) 250 MG tablet Take 1 tablet (250 mg total) by mouth 2 (two) times daily with a meal. Patient not taking: Reported on 09/23/2015 09/23/15   Mohammed Kindle, MD  levofloxacin (LEVAQUIN) 750 MG tablet Take 1 tablet (750 mg total) by mouth daily. 10/27/15 11/03/15  Rudene Re, MD  nabumetone (RELAFEN) 500 MG tablet Take 1 tablet (500 mg total) by mouth 2 (two) times daily. 09/08/15   Mohammed Kindle, MD  Oxycodone HCl 10 MG TABS Limit1/2 to 1 tablet by mouth 3 to 6 times per day if tolerated 10/07/15   Mohammed Kindle, MD    Allergies No known allergies  Family History  Problem Relation Age of Onset  . Diabetes Mother   . Diabetes Brother     Social History Social History  Substance Use Topics  . Smoking status: Current Every Day Smoker    Packs/day: 0.75    Types: Cigarettes  . Smokeless tobacco: Never Used  . Alcohol use No    Review of Systems  Constitutional: + fever, body aches Eyes: Negative for visual changes. ENT: Negative for sore throat. Cardiovascular: Negative for chest pain. Respiratory: Negative for shortness of breath. + Productive cough Gastrointestinal: Negative for abdominal pain, vomiting or diarrhea. Genitourinary: Negative for dysuria. Musculoskeletal: Negative for back pain. Skin: Negative for rash. Neurological: Negative for headaches, weakness or numbness.  ____________________________________________   PHYSICAL EXAM:  VITAL SIGNS: ED Triage Vitals [10/27/15 1719]  Enc Vitals Group     BP (!) 151/81  Pulse Rate 81     Resp 18     Temp 99.2 F (37.3 C)     Temp Source Oral     SpO2 95 %     Weight 155 lb (70.3 kg)     Height 5\' 8"  (1.727 m)     Head Circumference      Peak Flow      Pain Score 7     Pain Loc      Pain Edu?      Excl. in [place]?     Constitutional: Alert and oriented. Well appearing and in no apparent distress. HEENT:      Head: Normocephalic and atraumatic.         Eyes: Conjunctivae are  normal. Sclera is non-icteric. EOMI. PERRL      Mouth/Throat: Mucous membranes are moist.       Neck: Supple with no signs of meningismus. Cardiovascular: Regular rate and rhythm. No murmurs, gallops, or rubs. 2+ symmetrical distal pulses are present in all extremities. No JVD. Respiratory: Normal respiratory effort. Lungs are clear to auscultation bilaterally. No wheezes, crackles, or rhonchi.  Gastrointestinal: Soft, non tender, and non distended with positive bowel sounds. No rebound or guarding. Genitourinary: No CVA tenderness. Musculoskeletal: Nontender with normal range of motion in all extremities. No edema, cyanosis, or erythema of extremities.No CT and L-spine tenderness, diffuse paraspinal tenderness Neurologic: Normal speech and language. Face is symmetric. Moving all extremities. No gross focal neurologic deficits are appreciated. Skin: Skin is warm, dry and intact. No rash noted. Psychiatric: Mood and affect are normal. Speech and behavior are normal.  ____________________________________________   LABS (all labs ordered are listed, but only abnormal results are displayed)  Labs Reviewed  BASIC METABOLIC PANEL - Abnormal; Notable for the following:       Result Value   Sodium 132 (*)    Glucose, Bld 125 (*)    Calcium 8.4 (*)    All other components within normal limits  CBC - Abnormal; Notable for the following:    WBC 15.5 (*)    RDW 14.9 (*)    All other components within normal limits  URINALYSIS COMPLETEWITH MICROSCOPIC ([facility] ONLY) - Abnormal; Notable for the following:    Color, Urine YELLOW (*)    APPearance CLEAR (*)    Protein, ur 30 (*)    Squamous Epithelial / LPF 0-5 (*)    All other components within normal limits   ____________________________________________  EKG  none ____________________________________________  RADIOLOGY  CXR:  Left lower lobe pneumonia. Followup PA and lateral chest X-ray is recommended in 3-4 weeks following trial of  antibiotic therapy to ensure resolution and exclude underlying malignancy. ____________________________________________   PROCEDURES  Procedure(s) performed: None Procedures Critical Care performed:  None ____________________________________________   INITIAL IMPRESSION / ASSESSMENT AND PLAN / ED COURSE  75 y.o. male history of spinal stenosis and DJD who presents for evaluation of body aches, fever and cough productive of yellow sputum x 3 days. Patient here is afebrile with a temperature of 99.2, remaining of his vital signs are within normal limits, lungs are clear to auscultation with no crackles, abdomen is soft and nontender. Presentation concerning for viral URI versus pneumonia. We'll get a chest x-ray. We'll give a dose of Dilaudid for chronic back pain.  Clinical Course  Comment By Time  CXR concerning for PNA. Patient started on levaquin will dc home with close f/u with PCP. Discussed return precautions with patient. Rudene Re, MD 09/26  2058    Pertinent labs & imaging results that were available during my care of the patient were reviewed by me and considered in my medical decision making (see chart for details).    ____________________________________________   FINAL CLINICAL IMPRESSION(S) / ED DIAGNOSES  Final diagnoses:  Community acquired pneumonia      NEW MEDICATIONS STARTED DURING THIS VISIT:  New Prescriptions   LEVOFLOXACIN (LEVAQUIN) 750 MG TABLET    Take 1 tablet (750 mg total) by mouth daily.     Note:  This document was prepared using Dragon voice recognition software and may include unintentional dictation errors.    Rudene Re, MD 10/27/15 2100

## 2015-10-27 NOTE — ED Notes (Signed)
Pt complains of lower back pain across back and right lower leg pain. States he is cold and hurts all over.

## 2015-10-27 NOTE — ED Notes (Signed)
Patient's sister came back out and told nurse that patient now did want the medicine and so nurse went back in to get IV established to give pain medicine IV and patient would not relax arm to thread IV catheter. And patient stated it hurt and to take it out. So nurse took out IV and made MD aware that patient refused pain medicine and did not have IV established.

## 2015-10-27 NOTE — ED Triage Notes (Addendum)
States he is having body aches,joint pain with fever. Decreased appetite for couple of days  States pain is across lower back

## 2015-10-27 NOTE — Discharge Instructions (Signed)
You were seen in the Emergency Department (ED) today and diagnosed with pneumonia. Pneumonia is an infection of the lungs. Most cases are caused by infections from bacteria or viruses.  ° °Pneumonia may be mild or very severe. If it is caused by bacteria, you will be treated with antibiotics. It may take a few weeks to a few months to recover fully from pneumonia, depending on how sick you were and whether your overall health is good.  ° °If you were given antibiotics, take the prescription fully as prescribed. ° °Follow-up with your doctor in 1-2 days for further evaluation. ° °When should you call for help?  °Call 911 anytime you think you may need emergency care. For example, call if:  °You have difficulty breathing or chest pain ° °Call your doctor now or seek immediate medical care if:  °You cough up dark brown or bloody mucus (sputum).  °You have new or worse trouble breathing.  °You are dizzy or lightheaded, or you feel like you may faint. ° °Watch closely for changes in your health, and be sure to contact your doctor if:  °You have a new or higher fever.  °You are coughing more deeply or more often.  °You are not getting better after 2 days (48 hours).  °You do not get better as expected °  °How can you care for yourself at home?  °Take your antibiotics exactly as directed. Do not stop taking the medicine just because you are feeling better. You need to take the full course of antibiotics.  °Take your medicines exactly as prescribed. Call your doctor if you think you are having a problem with your medicine.  °Get plenty of rest and sleep. You may feel weak and tired for a while, but your energy level will improve with time.  °To prevent dehydration, drink plenty of fluids, enough so that your urine is light yellow or clear like water. Choose water and other caffeine-free clear liquids until you feel better. If you have kidney, heart, or liver disease and have to limit fluids, talk with your doctor before you  increase the amount of fluids you drink.  °Take care of your cough so you can rest. A cough that brings up mucus from your lungs is common with pneumonia. It is one way your body gets rid of the infection. But if coughing keeps you from resting or causes severe fatigue and chest-wall pain, talk to your doctor. He or she may suggest that you take a medicine to reduce the cough.  °Use a vaporizer or humidifier to add moisture to your bedroom. Follow the directions for cleaning the machine.  °Do not smoke or allow others to smoke around you. Smoke will make your cough last longer. If you need help quitting, talk to your doctor about stop-smoking programs and medicines. These can increase your chances of quitting for good.  °Take an over-the-counter pain medicine, such as acetaminophen (Tylenol), ibuprofen (Advil, Motrin), or naproxen (Aleve). Read and follow all instructions on the label.  °Do not take two or more pain medicines at the same time unless the doctor told you to. Many pain medicines have acetaminophen, which is Tylenol. Too much acetaminophen (Tylenol) can be harmful.  °If you were given a spirometer to measure how well your lungs are working, use it as instructed. This can help your doctor tell how your recovery is going.  °To prevent pneumonia in the future, talk to your doctor about getting a flu vaccine (once a   year) and a pneumococcal vaccine (one time only for most people). ° °

## 2016-02-11 ENCOUNTER — Emergency Department
Admission: EM | Admit: 2016-02-11 | Discharge: 2016-02-11 | Disposition: A | Discharge status: Home / Self Care | Payer: Medicare HMO | Attending: Student in an Organized Health Care Education/Training Program | Admitting: Student in an Organized Health Care Education/Training Program

## 2016-02-11 ENCOUNTER — Emergency Department: Payer: Medicare HMO

## 2016-02-11 ENCOUNTER — Encounter: Payer: Self-pay | Admitting: Emergency Medicine

## 2016-02-11 DIAGNOSIS — F17210 Nicotine dependence, cigarettes, uncomplicated: Secondary | ICD-10-CM | POA: Insufficient documentation

## 2016-02-11 DIAGNOSIS — Z79899 Other long term (current) drug therapy: Secondary | ICD-10-CM | POA: Diagnosis not present

## 2016-02-11 DIAGNOSIS — R079 Chest pain, unspecified: Secondary | ICD-10-CM

## 2016-02-11 DIAGNOSIS — J181 Lobar pneumonia, unspecified organism: Secondary | ICD-10-CM | POA: Insufficient documentation

## 2016-02-11 DIAGNOSIS — J189 Pneumonia, unspecified organism: Secondary | ICD-10-CM

## 2016-02-11 LAB — BASIC METABOLIC PANEL
Anion gap: 7 (ref 5–15)
BUN: 13 mg/dL (ref 6–20)
CO2: 28 mmol/L (ref 22–32)
Calcium: 8.9 mg/dL (ref 8.9–10.3)
Chloride: 100 mmol/L — ABNORMAL LOW (ref 101–111)
Creatinine, Ser: 1.09 mg/dL (ref 0.61–1.24)
GFR calc Af Amer: >60 mL/min (ref >60)
GFR calc non Af Amer: >60 mL/min (ref >60)
Glucose, Bld: 97 mg/dL (ref 65–99)
Potassium: 4 mmol/L (ref 3.5–5.1)
Sodium: 135 mmol/L (ref 135–145)

## 2016-02-11 LAB — INFLUENZA PANEL BY PCR (TYPE A & B)
Influenza A By PCR: NEGATIVE
Influenza B By PCR: NEGATIVE

## 2016-02-11 LAB — CBC
HCT: 46.7 % (ref 40.0–52.0)
Hemoglobin: 15 g/dL (ref 13.0–18.0)
MCH: 26 pg (ref 26.0–34.0)
MCHC: 32.1 g/dL (ref 32.0–36.0)
MCV: 81 fL (ref 80.0–100.0)
Platelets: 193 10*3/uL (ref 150–440)
RBC: 5.76 MIL/uL (ref 4.40–5.90)
RDW: 14.4 % (ref 11.5–14.5)
WBC: 20.5 10*3/uL — ABNORMAL HIGH (ref 3.8–10.6)

## 2016-02-11 LAB — TROPONIN I: Troponin I: 0.03 ng/mL (ref <0.03)

## 2016-02-11 LAB — LACTIC ACID, PLASMA: Lactic Acid, Venous: 1.3 mmol/L (ref 0.5–1.9)

## 2016-02-11 MED ORDER — LEVOFLOXACIN IN D5W 750 MG/150ML IV SOLN
750.0000 mg | Freq: Once | INTRAVENOUS | Status: AC
  Administered 2016-02-11: 750 mg via INTRAVENOUS
  Filled 2016-02-11: qty 150

## 2016-02-11 MED ORDER — LEVOFLOXACIN 500 MG PO TABS: 500.0000 mg | ORAL_TABLET | Freq: Every day | ORAL | 0 refills | Status: AC

## 2016-02-11 MED ORDER — ALBUTEROL SULFATE (2.5 MG/3ML) 0.083% IN NEBU
INHALATION_SOLUTION | RESPIRATORY_TRACT | Status: AC
  Administered 2016-02-11: 5 mg via RESPIRATORY_TRACT
  Filled 2016-02-11: qty 6

## 2016-02-11 MED ORDER — ALBUTEROL SULFATE HFA 108 (90 BASE) MCG/ACT IN AERS: 2.0000 | INHALATION_SPRAY | Freq: Four times a day (QID) | RESPIRATORY_TRACT | 2 refills | Status: DC | PRN

## 2016-02-11 MED ORDER — ALBUTEROL SULFATE (2.5 MG/3ML) 0.083% IN NEBU
5.0000 mg | INHALATION_SOLUTION | Freq: Once | RESPIRATORY_TRACT | Status: AC
  Administered 2016-02-11: 5 mg via RESPIRATORY_TRACT
  Filled 2016-02-11: qty 6

## 2016-02-11 MED ORDER — ALBUTEROL SULFATE (2.5 MG/3ML) 0.083% IN NEBU
5.0000 mg | INHALATION_SOLUTION | Freq: Once | RESPIRATORY_TRACT | Status: AC
  Administered 2016-02-11: 5 mg via RESPIRATORY_TRACT

## 2016-02-11 NOTE — ED Provider Notes (Signed)
[facility] Emergency Department Provider Note    First MD Initiated Contact with Patient 02/11/16 1635     (approximate)  I have reviewed the triage vital signs and the nursing notes.   HISTORY  Chief Complaint Chest Pain    HPI STAFFON SASLOW is a 76 y.o. male presents with right-sided chest pain that started around Monday associated with productive cough of green sputum. Patient states he's been having intermittent chills. Is describing generalized malaise. Feels very weak and tired. He lives at home alone. Denies any recent admission to hospital. No nausea or vomiting. States it does hurt when he takes a deep breath. Is not on any blood thinners. denies any chest pain or pressure.   Past Medical History:  Diagnosis Date  . Renal disorder    kidney stones   Family History  Problem Relation Age of Onset  . Diabetes Mother   . Diabetes Brother    Past Surgical History:  Procedure Laterality Date  . INGUINAL HERNIA REPAIR    . KNEE SURGERY    . NECK SURGERY     Patient Active Problem List   Diagnosis Date Noted  . DDD (degenerative disc disease), lumbar 07/07/2014  . DDD (degenerative disc disease), cervical 07/07/2014  . DJD (degenerative joint disease) of knee 07/07/2014  . Bilateral occipital neuralgia 07/07/2014  . Spinal stenosis, lumbar region, with neurogenic claudication 07/07/2014      Prior to Admission medications   Medication Sig Start Date End Date Taking? Authorizing Provider  albuterol (PROVENTIL HFA;VENTOLIN HFA) 108 (90 BASE) MCG/ACT inhaler Inhale 2 puffs into the lungs every 6 (six) hours as needed for wheezing or shortness of breath. Patient not taking: Reported on 09/08/2015 07/20/14   [name], PA-C  cefUROXime (CEFTIN) 250 MG tablet Take 1 tablet (250 mg total) by mouth 2 (two) times daily with a meal. Patient not taking: Reported on 09/23/2015 09/23/15   Mohammed Kindle, MD  nabumetone (RELAFEN) 500 MG tablet  Take 1 tablet (500 mg total) by mouth 2 (two) times daily. 09/08/15   Mohammed Kindle, MD  Oxycodone HCl 10 MG TABS Limit1/2 to 1 tablet by mouth 3 to 6 times per day if tolerated 10/07/15   Mohammed Kindle, MD    Allergies No known allergies    Social History Social History  Substance Use Topics  . Smoking status: Current Every Day Smoker    Packs/day: 0.75    Types: Cigarettes  . Smokeless tobacco: Never Used  . Alcohol use No    Review of Systems Patient denies headaches, rhinorrhea, blurry vision, numbness, shortness of breath, chest pain, edema, cough, abdominal pain, nausea, vomiting, diarrhea, dysuria, fevers, rashes or hallucinations unless otherwise stated above in HPI. ____________________________________________   PHYSICAL EXAM:  VITAL SIGNS: Vitals:   02/11/16 1543  BP: (!) 176/80  Pulse: 74  Resp: 16  Temp: 98.2 F (36.8 C)    Constitutional: Alert and oriented. ill appearing but in no acute distress. Eyes: Conjunctivae are normal. PERRL. EOMI. Head: Atraumatic. Nose: No congestion/rhinnorhea. Mouth/Throat: Mucous membranes are moist.  Oropharynx non-erythematous. Neck: No stridor. Painless ROM. No cervical spine tenderness to palpation Hematological/Lymphatic/Immunilogical: No cervical lymphadenopathy. Cardiovascular: Normal rate, regular rhythm. Grossly normal heart sounds.  Good peripheral circulation. Respiratory: Normal respiratory effort.  No retractions. Diminished BS in right posterior base Gastrointestinal: Soft and nontender. No distention. No abdominal bruits. No CVA tenderness. Musculoskeletal: No lower extremity tenderness nor edema.  No joint effusions. Neurologic:  Normal  speech and language. No gross focal neurologic deficits are appreciated. No gait instability. Skin:  Skin is warm, dry and intact. No rash noted. Psychiatric: Mood and affect are normal. Speech and behavior are normal.  ____________________________________________   LABS (all  labs ordered are listed, but only abnormal results are displayed)  Results for orders placed or performed during the hospital encounter of 02/11/16 (from the past 24 hour(s))  Basic metabolic panel     Status: Abnormal   Collection Time: 02/11/16  3:47 PM  Result Value Ref Range   Sodium 135 135 - 145 mmol/L   Potassium 4.0 3.5 - 5.1 mmol/L   Chloride 100 (L) 101 - 111 mmol/L   CO2 28 22 - 32 mmol/L   Glucose, Bld 97 65 - 99 mg/dL   BUN 13 6 - 20 mg/dL   Creatinine, Ser 1.09 0.61 - 1.24 mg/dL   Calcium 8.9 8.9 - 10.3 mg/dL   GFR calc non Af Amer >60 >60 mL/min   GFR calc Af Amer >60 >60 mL/min   Anion gap 7 5 - 15  CBC     Status: Abnormal   Collection Time: 02/11/16  3:47 PM  Result Value Ref Range   WBC 20.5 (H) 3.8 - 10.6 K/uL   RBC 5.76 4.40 - 5.90 MIL/uL   Hemoglobin 15.0 13.0 - 18.0 g/dL   HCT 46.7 40.0 - 52.0 %   MCV 81.0 80.0 - 100.0 fL   MCH 26.0 26.0 - 34.0 pg   MCHC 32.1 32.0 - 36.0 g/dL   RDW 14.4 11.5 - 14.5 %   Platelets 193 150 - 440 K/uL  Troponin I     Status: Abnormal   Collection Time: 02/11/16  3:47 PM  Result Value Ref Range   Troponin I 0.03 (HH) <0.03 ng/mL   ____________________________________________  EKG My review and personal interpretation at Time: 15:25   Indication: chest pain  Rate: 80-  Rhythm: sinus Axis: leftl Other:  Poor r wave progression, non specific st changes, no STEMI ____________________________________________  RADIOLOGY  I personally reviewed all radiographic images ordered to evaluate for the above acute complaints and reviewed radiology reports and findings.  These findings were personally discussed with the patient.  Please see medical record for radiology report.  ____________________________________________   PROCEDURES  Procedure(s) performed:  Procedures    Critical Care performed: no ____________________________________________   INITIAL IMPRESSION / ASSESSMENT AND PLAN / ED COURSE  Pertinent labs &  imaging results that were available during my care of the patient were reviewed by me and considered in my medical decision making (see chart for details).  DDX: Asthma, copd, CHF, pna, ptx, malignancy, Pe, anemia   KJELL KIELAR is a 76 y.o. who presents to the ED with Right-sided chest pain with productive green sputum cough. Patient afebrile and hemodynamically stable but does appear ill and frail. Chest x-ray ordered due to concern for pneumonia versus CHF shows right lower lobe infiltrate with probable effusion. Patient has a markedly leukocytosis to 20,000. He is not febrile at this time but is describing fevers and chills. Based on presentation I am concerned for pneumonia. It would be a community-acquired pneumonia by criteria. EKG shows no evidence of acute ischemia. Does have a mild troponin elevation to 0.03. This is likely demand ischemia in the setting of pneumonia. Patient also smokes therefore we will treat with nebulizer treatments.  Clinical Course as of Feb 11 21  Thu Feb 11, 2016  2001 Patient requesting  discharge home. I discussed my concern for pneumonia and worsening of his condition given his age and risk factors and I have recommended the patient be admitted to the hospital. Expanded the patient that given his presentation that the standard of care is to admit the patient for further evaluation and management including IV antibiotics and breathing treatments. This would also allow [place] to ensure that his respiratory status does not worsen and that his troponin is not elevating. Patient states understanding but is still stated that he just wants to go home. States that he is thankful for treatment but does not want to stay in the hospital. States that he is concerned about influenza does not want to stay for any further testing.  He has agreed to stay for one more nebulizer. He has received IV antibiotics. We'll discharge with antibiotics. He is family friend is at bedside it  agrees to take the patient home, ensure that his medications are filled and check on him tomorrow morning.  Patient was able to tolerate PO and was able to ambulate with a steady gait.  Have discussed with the patient and available family all diagnostics and treatments performed thus far and all questions were answered to the best of my ability. The patient demonstrates understanding and agreement with plan.   [PR]    Clinical Course User Index [PR] Merlyn Lot, MD     ____________________________________________   FINAL CLINICAL IMPRESSION(S) / ED DIAGNOSES  Final diagnoses:  Community acquired pneumonia of right lower lobe of lung ([place])  Chest pain, unspecified type      NEW MEDICATIONS STARTED DURING THIS VISIT:  New Prescriptions   No medications on file     Note:  This document was prepared using Dragon voice recognition software and may include unintentional dictation errors.    Merlyn Lot, MD 02/12/16 [phone]

## 2016-02-11 NOTE — ED Notes (Signed)
Patient ambulated, SpO2 dipped to 91%. MD notified.

## 2016-02-11 NOTE — ED Triage Notes (Signed)
Pt presents to ED with c/o R sided chest pain that radiates down to the top of his R leg. Pt states hx of "walking pneumonia" and states symptoms feel the same. Pt is alert and oriented at this time, able to answer all questions appropriately.

## 2016-02-15 ENCOUNTER — Other Ambulatory Visit: Payer: Self-pay | Admitting: Pain Medicine

## 2016-02-16 LAB — CULTURE, BLOOD (ROUTINE X 2)
Culture: NO GROWTH
Culture: NO GROWTH

## 2016-03-04 ENCOUNTER — Emergency Department
Admission: EM | Admit: 2016-03-04 | Discharge: 2016-03-04 | Disposition: A | Discharge status: Home / Self Care | Payer: Medicare HMO | Attending: Emergency Medicine | Admitting: Emergency Medicine

## 2016-03-04 ENCOUNTER — Encounter: Payer: Self-pay | Admitting: Emergency Medicine

## 2016-03-04 ENCOUNTER — Emergency Department: Payer: Medicare HMO

## 2016-03-04 DIAGNOSIS — S80211A Abrasion, right knee, initial encounter: Secondary | ICD-10-CM | POA: Diagnosis not present

## 2016-03-04 DIAGNOSIS — S8991XA Unspecified injury of right lower leg, initial encounter: Secondary | ICD-10-CM | POA: Diagnosis present

## 2016-03-04 DIAGNOSIS — Y929 Unspecified place or not applicable: Secondary | ICD-10-CM | POA: Diagnosis not present

## 2016-03-04 DIAGNOSIS — Y939 Activity, unspecified: Secondary | ICD-10-CM | POA: Insufficient documentation

## 2016-03-04 DIAGNOSIS — W010XXA Fall on same level from slipping, tripping and stumbling without subsequent striking against object, initial encounter: Secondary | ICD-10-CM | POA: Diagnosis not present

## 2016-03-04 DIAGNOSIS — F17210 Nicotine dependence, cigarettes, uncomplicated: Secondary | ICD-10-CM | POA: Diagnosis not present

## 2016-03-04 DIAGNOSIS — W19XXXA Unspecified fall, initial encounter: Secondary | ICD-10-CM

## 2016-03-04 DIAGNOSIS — Y999 Unspecified external cause status: Secondary | ICD-10-CM | POA: Insufficient documentation

## 2016-03-04 MED ORDER — PREDNISONE 10 MG (21) PO TBPK: 10.0000 mg | ORAL_TABLET | Freq: Every day | ORAL | 0 refills | Status: DC

## 2016-03-04 NOTE — ED Triage Notes (Signed)
Pt states he fell over a mat yesterday and landed on right leg. Pt is complaining of pain to right leg.

## 2016-03-04 NOTE — ED Provider Notes (Signed)
[facility] Emergency Department Provider Note  ____________________________________________  Time seen: Approximately 6:26 PM  I have reviewed the triage vital signs and the nursing notes.   HISTORY  Chief Complaint Fall    HPI [name] [name] is a 76 y.o. male presenting to the emergency department with aching and non-radiating 10 out of 10 right knee pain. Patient states that he tripped over a floor mat at a gas station last night. Patient states that he landed on his right knee. He denies hitting his head of LOC.  Patient has had one prior surgery to the right knee. Patient is unsure of the particular surgery that he had but it was "several years ago". Patient states that he typically ambulates with a cane. Patient states that right knee pain has worsened through the night and is disturbing his sleep. Patient states that he has takenOxycodone, which he uses for chronic pain. Patient is a retired Company secretary. Patient denies radiculopathy or weakness.   Past Medical History:  Diagnosis Date  . Renal disorder    kidney stones    Patient Active Problem List   Diagnosis Date Noted  . DDD (degenerative disc disease), lumbar 07/07/2014  . DDD (degenerative disc disease), cervical 07/07/2014  . DJD (degenerative joint disease) of knee 07/07/2014  . Bilateral occipital neuralgia 07/07/2014  . Spinal stenosis, lumbar region, with neurogenic claudication 07/07/2014    Past Surgical History:  Procedure Laterality Date  . INGUINAL HERNIA REPAIR    . KNEE SURGERY    . NECK SURGERY      Prior to Admission medications   Medication Sig Start Date End Date Taking? Authorizing Provider  albuterol (PROVENTIL HFA;VENTOLIN HFA) 108 (90 Base) MCG/ACT inhaler Inhale 2 puffs into the lungs every 6 (six) hours as needed for wheezing or shortness of breath. 02/11/16   Merlyn Lot, MD  Oxycodone HCl 10 MG TABS Limit1/2 to 1 tablet by mouth 3 to 6 times per day if  tolerated Patient taking differently: Take 10 mg by mouth 2 (two) times daily.  10/07/15   Mohammed Kindle, MD  predniSONE (STERAPRED UNI-PAK 21 TAB) 10 MG (21) TBPK tablet Take 1 tablet (10 mg total) by mouth daily. Take 6 tablets the first day, take 5 tablets the second day, take 4 tablets the third day, take 3 tablets the fourth day, take 2 tablets the fifth day, take 1 tablet the sixth day. 03/04/16   Lannie Fields, PA-C    Allergies No known allergies  Family History  Problem Relation Age of Onset  . Diabetes Mother   . Diabetes Brother     Social History Social History  Substance Use Topics  . Smoking status: Current Every Day Smoker    Packs/day: 0.75    Types: Cigarettes  . Smokeless tobacco: Never Used  . Alcohol use No     Review of Systems  Constitutional: No fever/chills Eyes: No visual changes. No discharge ENT: No upper respiratory complaints. Cardiovascular: no chest pain. Respiratory: no cough. No SOB. Gastrointestinal: No abdominal pain.  No nausea, no vomiting.  No diarrhea.  No constipation. Musculoskeletal: Patient has right knee pain.  Skin: Patient has small abrasions of skin overlying right knee.  Neurological: Negative for headaches, focal weakness or numbness.  ____________________________________________   PHYSICAL EXAM:  VITAL SIGNS: ED Triage Vitals  Enc Vitals Group     BP 03/04/16 1754 (!) 160/79     Pulse Rate 03/04/16 1754 63     Resp 03/04/16  1754 18     Temp 03/04/16 1754 98.2 F (36.8 C)     Temp Source 03/04/16 1754 Oral     SpO2 03/04/16 1754 96 %     Weight 03/04/16 1755 160 lb (72.6 kg)     Height 03/04/16 1755 5\' 8"  (1.727 m)     Head Circumference --      Peak Flow --      Pain Score 03/04/16 1756 9     Pain Loc --      Pain Edu? --      Excl. in [place]? --      Constitutional: Alert and oriented. Well appearing and in no acute distress. Eyes: Conjunctivae are normal. PERRL. EOMI. Head: Atraumatic. Cardiovascular: Normal  rate, regular rhythm. Normal S1 and S2.  Good peripheral circulation. Respiratory: Normal respiratory effort without tachypnea or retractions. Lungs CTAB. Good air entry to the bases with no decreased or absent breath sounds. Musculoskeletal: To inspection, knees appear symmetric. Patient has full range of motion at the hip, knee and ankle bilaterally. Negative ballottement, right. Negative apprehension test, right. Negative anterior and posterior drawer test, right. Pain is elicited with McMurray's testing, right. Tenderness is elicited with palpation of the medial joint line, right. No pain was elicited with palpation over the fibular head. Palpable dorsalis pedis pulses bilaterally and symmetrically. Neurologic:  Normal speech and language. No gross focal neurologic deficits are appreciated. Reflexes are 2+ and symmetric in the lower extremities bilaterally. Skin:  Patient has a small abrasion of the skin overlying the right knee. Psychiatric: Mood and affect are normal. Speech and behavior are normal. Patient exhibits appropriate insight and judgement. ____________________________________________   LABS (all labs ordered are listed, but only abnormal results are displayed)  Labs Reviewed - No data to display ____________________________________________  EKG   ____________________________________________  RADIOLOGY Unk Pinto, personally viewed and evaluated these images (plain radiographs) as part of my medical decision making, as well as reviewing the written report by the radiologist.  Dg Knee Complete 4 Views Right  Result Date: 03/04/2016 CLINICAL DATA:  Pain after trauma. EXAM: RIGHT KNEE - COMPLETE 4+ VIEW COMPARISON:  December 17, 2007 FINDINGS: Degenerative changes are seen, most prominent in the medial compartment. No significant joint effusion. No other abnormalities. IMPRESSION: Degenerative changes.  No fracture or dislocation identified. Electronically Signed   By:  Dorise Bullion III M.D   On: 03/04/2016 18:59    ____________________________________________    PROCEDURES  Procedure(s) performed:    Procedures    Medications - No data to display   ____________________________________________   INITIAL IMPRESSION / ASSESSMENT AND PLAN / ED COURSE  Pertinent labs & imaging results that were available during my care of the patient were reviewed by me and considered in my medical decision making (see chart for details).  Review of the [place] CSRS was performed in accordance of the [place] prior to dispensing any controlled drugs.     Assessment and plan: Right knee pain: Patient presents to the emergency department with acute 10/10 right knee pain. Patient takes oxycodone and has established care with a pain management team. He was discharged with tapered prednisone to address pain and inflammation. Patient was referred to orthopedics, Dr. Sabra Heck. Patient was advised to make an appointment in one week if right knee pain persists. Strict return precautions were given. All patient questions were answered. ____________________________________________  FINAL CLINICAL IMPRESSION(S) / ED DIAGNOSES  Final diagnoses:  Fall, initial encounter  NEW MEDICATIONS STARTED DURING THIS VISIT:  Discharge Medication List as of 03/04/2016  7:18 PM    START taking these medications   Details  predniSONE (STERAPRED UNI-PAK 21 TAB) 10 MG (21) TBPK tablet Take 1 tablet (10 mg total) by mouth daily. Take 6 tablets the first day, take 5 tablets the second day, take 4 tablets the third day, take 3 tablets the fourth day, take 2 tablets the fifth day, take 1 tablet the sixth day., Starting Fri 03/04/2016, Print            This chart was dictated using voice recognition software/Dragon. Despite best efforts to proofread, errors can occur which can change the meaning. Any change was purely unintentional.    Lannie Fields, PA-C 03/04/16 2127     Earleen Newport, MD 03/04/16 [phone]

## 2016-03-14 ENCOUNTER — Ambulatory Visit (INDEPENDENT_AMBULATORY_CARE_PROVIDER_SITE_OTHER): Payer: Medicare HMO | Admitting: Orthopedic Surgery

## 2016-03-14 ENCOUNTER — Ambulatory Visit (INDEPENDENT_AMBULATORY_CARE_PROVIDER_SITE_OTHER): Payer: Medicare HMO

## 2016-03-14 ENCOUNTER — Encounter (INDEPENDENT_AMBULATORY_CARE_PROVIDER_SITE_OTHER): Payer: Self-pay | Admitting: Orthopedic Surgery

## 2016-03-14 ENCOUNTER — Ambulatory Visit (INDEPENDENT_AMBULATORY_CARE_PROVIDER_SITE_OTHER): Payer: Self-pay

## 2016-03-14 ENCOUNTER — Encounter (INDEPENDENT_AMBULATORY_CARE_PROVIDER_SITE_OTHER): Payer: Self-pay

## 2016-03-14 DIAGNOSIS — G8929 Other chronic pain: Secondary | ICD-10-CM | POA: Diagnosis not present

## 2016-03-14 DIAGNOSIS — M25561 Pain in right knee: Secondary | ICD-10-CM

## 2016-03-14 DIAGNOSIS — M5441 Lumbago with sciatica, right side: Secondary | ICD-10-CM
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
## 2016-03-16 ENCOUNTER — Encounter (INDEPENDENT_AMBULATORY_CARE_PROVIDER_SITE_OTHER): Payer: Self-pay | Admitting: Physical Medicine and Rehabilitation

## 2016-03-16 ENCOUNTER — Ambulatory Visit (INDEPENDENT_AMBULATORY_CARE_PROVIDER_SITE_OTHER): Payer: Self-pay

## 2016-03-16 ENCOUNTER — Ambulatory Visit (INDEPENDENT_AMBULATORY_CARE_PROVIDER_SITE_OTHER): Payer: Medicare HMO | Admitting: Physical Medicine and Rehabilitation

## 2016-03-16 DIAGNOSIS — M25551 Pain in right hip: Secondary | ICD-10-CM | POA: Diagnosis not present

## 2016-03-16 NOTE — Progress Notes (Signed)
[name] [name] - 76 y.o. male MRN [id number]  Date of birth: [birth year]  Office Visit Note: Visit Date: 03/16/2016 PCP: Debbora Dus, MD Referred by: Meredith Pel, MD  Subjective: Chief Complaint  Patient presents with  . Right Hip - Pain   HPI: [name] [name] is a 76 year old gentleman with hip and groin pain for several months. Catching with walking and when gets up from seated position. Ambulating with cane. Radiating down leg to knee. 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 Since that time he said worsening pain. No numbness tingling or paresthesias.    ROS Otherwise per HPI.  Assessment & Plan: Visit Diagnoses:  1. Pain in right hip     Plan: Findings:  Right hip anesthetic arthrogram.    Meds & Orders: No orders of the defined types were placed in this encounter.   Orders Placed This Encounter  Procedures  . Large Joint Injection/Arthrocentesis  . XR C-ARM NO REPORT    Follow-up: Return if symptoms worsen or fail to improve, 2 weeks, for Dr. Marlou Sa.   Procedures: Hip anesthetic arthrogram Date/Time: 03/16/2016 3:46 PM Performed by: Magnus Sinning Authorized by: Magnus Sinning   Consent Given by:  Patient Site marked: the procedure site was marked   Timeout: prior to procedure the correct patient, procedure, and site was verified   Indications:  Pain and diagnostic evaluation Location:  Hip Site:  R hip joint Prep: patient was prepped and draped in usual sterile fashion   Needle Size:  22 G Approach:  Anterior Ultrasound Guidance: No   Fluoroscopic Guidance: No   Arthrogram: Yes   Medications:  3 mL bupivacaine 0.5 %; 80 mg triamcinolone acetonide 40 MG/ML Aspiration Attempted: Yes   Patient tolerance:  Patient tolerated the procedure well with no immediate complications  Arthrogram demonstrated excellent flow of contrast throughout the joint surface without extravasation or  obvious defect.  The patient had relief of symptoms during the anesthetic phase of the injection.      No notes on file   Clinical History: No specialty comments available.  He reports that he has been smoking Cigarettes.  He has been smoking about 0.75 packs per day. He has never used smokeless tobacco. No results for input(s): HGBA1C, LABURIC in the last 8760 hours.  Objective:  VS:  HT:    WT:   BMI:     BP:   HR: bpm  TEMP: ( )  RESP:  Physical Exam  Musculoskeletal:  Patient ambulates with a cane with an antalgic gait to the right. He does have concordant pain with internal rotation of the right hip.    Ortho Exam Imaging: No results found.  Past Medical/Family/Surgical/Social History: Medications & Allergies reviewed per EMR Patient Active Problem List   Diagnosis Date Noted  . Chronic right-sided low back pain with right-sided sciatica 03/14/2016  . Chronic pain of right knee 03/14/2016  . DDD (degenerative disc disease), lumbar 07/07/2014  . DDD (degenerative disc disease), cervical 07/07/2014  . DJD (degenerative joint disease) of knee 07/07/2014  . Bilateral occipital neuralgia 07/07/2014  . Spinal stenosis, lumbar region, with neurogenic claudication 07/07/2014   Past Medical History:  Diagnosis Date  . Renal disorder    kidney stones   Family History  Problem Relation Age of Onset  . Diabetes Mother   . Diabetes Brother    Past Surgical History:  Procedure Laterality Date  .  INGUINAL HERNIA REPAIR    . KNEE SURGERY    . NECK SURGERY     Social History   Occupational History  . Not on file.   Social History Main Topics  . Smoking status: Current Every Day Smoker    Packs/day: 0.75    Types: Cigarettes  . Smokeless tobacco: Never Used  . Alcohol use No  . Drug use: No  . Sexual activity: Not on file

## 2016-03-16 NOTE — Patient Instructions (Signed)

## 2016-03-18 MED ORDER — TRIAMCINOLONE ACETONIDE 40 MG/ML IJ SUSP
80.0000 mg | INTRAMUSCULAR | Status: AC | PRN
  Administered 2016-03-16: 80 mg via INTRA_ARTICULAR

## 2016-03-18 MED ORDER — BUPIVACAINE HCL 0.5 % IJ SOLN
3.0000 mL | INTRAMUSCULAR | Status: AC | PRN
  Administered 2016-03-16: 3 mL via INTRA_ARTICULAR

## 2016-03-21 ENCOUNTER — Emergency Department: Payer: Medicare HMO

## 2016-03-21 ENCOUNTER — Emergency Department
Admission: EM | Admit: 2016-03-21 | Discharge: 2016-03-22 | Disposition: A | Discharge status: Home / Self Care | Payer: Medicare HMO | Attending: Emergency Medicine | Admitting: Emergency Medicine

## 2016-03-21 ENCOUNTER — Telehealth (INDEPENDENT_AMBULATORY_CARE_PROVIDER_SITE_OTHER): Payer: Self-pay | Admitting: Physical Medicine and Rehabilitation

## 2016-03-21 ENCOUNTER — Encounter: Payer: Self-pay | Admitting: Emergency Medicine

## 2016-03-21 DIAGNOSIS — Y939 Activity, unspecified: Secondary | ICD-10-CM | POA: Insufficient documentation

## 2016-03-21 DIAGNOSIS — F17210 Nicotine dependence, cigarettes, uncomplicated: Secondary | ICD-10-CM | POA: Diagnosis not present

## 2016-03-21 DIAGNOSIS — S3992XA Unspecified injury of lower back, initial encounter: Secondary | ICD-10-CM | POA: Diagnosis present

## 2016-03-21 DIAGNOSIS — Y999 Unspecified external cause status: Secondary | ICD-10-CM | POA: Insufficient documentation

## 2016-03-21 DIAGNOSIS — M545 Low back pain, unspecified: Secondary | ICD-10-CM

## 2016-03-21 DIAGNOSIS — Y92009 Unspecified place in unspecified non-institutional (private) residence as the place of occurrence of the external cause: Secondary | ICD-10-CM | POA: Insufficient documentation

## 2016-03-21 DIAGNOSIS — S0990XA Unspecified injury of head, initial encounter: Secondary | ICD-10-CM | POA: Insufficient documentation

## 2016-03-21 DIAGNOSIS — W1839XA Other fall on same level, initial encounter: Secondary | ICD-10-CM | POA: Insufficient documentation

## 2016-03-21 DIAGNOSIS — R52 Pain, unspecified: Secondary | ICD-10-CM

## 2016-03-21 DIAGNOSIS — Z79899 Other long term (current) drug therapy: Secondary | ICD-10-CM | POA: Diagnosis not present

## 2016-03-21 DIAGNOSIS — G8929 Other chronic pain: Secondary | ICD-10-CM | POA: Diagnosis not present

## 2016-03-21 DIAGNOSIS — W19XXXA Unspecified fall, initial encounter: Secondary | ICD-10-CM

## 2016-03-21 LAB — CBC WITH DIFFERENTIAL/PLATELET
Basophils Absolute: 0.1 10*3/uL (ref 0–0.1)
Basophils Relative: 1 %
Eosinophils Absolute: 0.2 10*3/uL (ref 0–0.7)
Eosinophils Relative: 1 %
HCT: 46.3 % (ref 40.0–52.0)
Hemoglobin: 15.1 g/dL (ref 13.0–18.0)
Lymphocytes Relative: 32 %
Lymphs Abs: 5.6 10*3/uL — ABNORMAL HIGH (ref 1.0–3.6)
MCH: 25.7 pg — ABNORMAL LOW (ref 26.0–34.0)
MCHC: 32.7 g/dL (ref 32.0–36.0)
MCV: 78.5 fL — ABNORMAL LOW (ref 80.0–100.0)
Monocytes Absolute: 1.6 10*3/uL — ABNORMAL HIGH (ref 0.2–1.0)
Monocytes Relative: 9 %
Neutro Abs: 9.9 10*3/uL — ABNORMAL HIGH (ref 1.4–6.5)
Neutrophils Relative %: 57 %
Platelets: 289 10*3/uL (ref 150–440)
RBC: 5.9 MIL/uL (ref 4.40–5.90)
RDW: 14.7 % — ABNORMAL HIGH (ref 11.5–14.5)
WBC: 17.4 10*3/uL — ABNORMAL HIGH (ref 3.8–10.6)

## 2016-03-21 LAB — BASIC METABOLIC PANEL
Anion gap: 7 (ref 5–15)
BUN: 19 mg/dL (ref 6–20)
CO2: 30 mmol/L (ref 22–32)
Calcium: 8.6 mg/dL — ABNORMAL LOW (ref 8.9–10.3)
Chloride: 104 mmol/L (ref 101–111)
Creatinine, Ser: 1.3 mg/dL — ABNORMAL HIGH (ref 0.61–1.24)
GFR calc Af Amer: >60 mL/min (ref >60)
GFR calc non Af Amer: 52 mL/min — ABNORMAL LOW (ref >60)
Glucose, Bld: 120 mg/dL — ABNORMAL HIGH (ref 65–99)
Potassium: 3.8 mmol/L (ref 3.5–5.1)
Sodium: 141 mmol/L (ref 135–145)

## 2016-03-21 MED ORDER — ONDANSETRON HCL 4 MG/2ML IJ SOLN
4.0000 mg | Freq: Once | INTRAMUSCULAR | Status: AC
  Administered 2016-03-21: 4 mg via INTRAVENOUS
  Filled 2016-03-21: qty 2

## 2016-03-21 MED ORDER — HYDROMORPHONE HCL 1 MG/ML IJ SOLN
0.5000 mg | Freq: Once | INTRAMUSCULAR | Status: AC
  Administered 2016-03-21: 0.5 mg via INTRAVENOUS
  Filled 2016-03-21: qty 1

## 2016-03-21 NOTE — Telephone Encounter (Signed)
He seemed to get relief at first with numbing medicine, he has Lspine MRI pending. Send message to Dr. Marlou Sa for eval

## 2016-03-21 NOTE — Telephone Encounter (Signed)
Pt called back and asked if someone could call him

## 2016-03-21 NOTE — Telephone Encounter (Signed)
See notes, please advise.

## 2016-03-21 NOTE — Telephone Encounter (Signed)
Pending Dr Marlou Sa response, will call pt once Dr Marlou Sa advises.

## 2016-03-21 NOTE — ED Provider Notes (Signed)
[facility] Emergency Department Provider Note   ____________________________________________   First MD Initiated Contact with Patient 03/21/16 2035     (approximate)  I have reviewed the triage vital signs and the nursing notes.   HISTORY  Chief Complaint Back Pain and Fall    HPI [name] [name] is a 76 y.o. male who fell several days ago. He's been having increasing low back pain and frequent falls for the last several days. He denies any incontinence or numbness in his groin but says his late right leg feels weak and like it'll give out on him. He also reports it hurts a lot when he tries to use it. He is scheduled for an MRI Wednesday 2 days from now but his pain is increasing and is fallen several times even today.   Past Medical History:  Diagnosis Date  . Renal disorder    kidney stones    Patient Active Problem List   Diagnosis Date Noted  . Chronic right-sided low back pain with right-sided sciatica 03/14/2016  . Chronic pain of right knee 03/14/2016  . DDD (degenerative disc disease), lumbar 07/07/2014  . DDD (degenerative disc disease), cervical 07/07/2014  . DJD (degenerative joint disease) of knee 07/07/2014  . Bilateral occipital neuralgia 07/07/2014  . Spinal stenosis, lumbar region, with neurogenic claudication 07/07/2014    Past Surgical History:  Procedure Laterality Date  . INGUINAL HERNIA REPAIR    . KNEE SURGERY    . NECK SURGERY      Prior to Admission medications   Medication Sig Start Date End Date Taking? Authorizing Provider  albuterol (PROVENTIL HFA;VENTOLIN HFA) 108 (90 Base) MCG/ACT inhaler Inhale 2 puffs into the lungs every 6 (six) hours as needed for wheezing or shortness of breath. 02/11/16   Merlyn Lot, MD  Oxycodone HCl 10 MG TABS Limit1/2 to 1 tablet by mouth 3 to 6 times per day if tolerated Patient taking differently: Take 10 mg by mouth 2 (two) times daily.  10/07/15   Mohammed Kindle, MD    predniSONE (STERAPRED UNI-PAK 21 TAB) 10 MG (21) TBPK tablet Take 1 tablet (10 mg total) by mouth daily. Take 6 tablets the first day, take 5 tablets the second day, take 4 tablets the third day, take 3 tablets the fourth day, take 2 tablets the fifth day, take 1 tablet the sixth day. Patient not taking: Reported on 03/14/2016 03/04/16   Lannie Fields, PA-C    Allergies No known allergies  Family History  Problem Relation Age of Onset  . Diabetes Mother   . Diabetes Brother     Social History Social History  Substance Use Topics  . Smoking status: Current Every Day Smoker    Packs/day: 0.75    Types: Cigarettes  . Smokeless tobacco: Never Used  . Alcohol use No    Review of Systems Constitutional: No fever/chills Eyes: No visual changes. ENT: No sore throat. Cardiovascular: Denies chest pain. Respiratory: Denies shortness of breath. Gastrointestinal: No abdominal pain.  No nausea, no vomiting.  No diarrhea.  No constipation. Genitourinary: Negative for dysuria. Musculoskeletal:  back pain. Skin: Negative for rash.  10-point ROS otherwise negative.  ____________________________________________   PHYSICAL EXAM:  VITAL SIGNS: ED Triage Vitals  Enc Vitals Group     BP 03/21/16 2028 (!) 181/92     Pulse Rate 03/21/16 2028 73     Resp 03/21/16 2028 18     Temp 03/21/16 2028 97.8 F (36.6 C)  Temp Source 03/21/16 2028 Oral     SpO2 03/21/16 2028 96 %     Weight 03/21/16 2026 155 lb (70.3 kg)     Height 03/21/16 2026 5\' 8"  (1.727 m)     Head Circumference --      Peak Flow --      Pain Score 03/21/16 2026 10     Pain Loc --      Pain Edu? --      Excl. in [place]? --     Constitutional: Alert and oriented. Well appearing and in no acute distress. Eyes: Conjunctivae are normal. PERRL. EOMI. Head: Atraumatic. Nose: No congestion/rhinnorhea. Mouth/Throat: Mucous membranes are moist.  Oropharynx non-erythematous. Neck: No stridor.  Cardiovascular: Normal rate,  regular rhythm. Grossly normal heart sounds.  Good peripheral circulation. Respiratory: Normal respiratory effort.  No retractions. Lungs CTAB. Gastrointestinal: Soft and nontender. No distention. No abdominal bruits. No CVA tenderness. Musculoskeletal: Patient reports right leg is diffusely tender to palpation but there is no bruising or swelling. Lower leg is numb left lower leg is numb as well and tender to palpation. Again no bruising or swelling is noted patient has good range of motion of each leg.  No joint effusions. Neurologic:  Normal speech and language. Right leg is tender to movement possibly reclose difficult to tell Skin:  Skin is warm, dry and intact. No rash noted.   ____________________________________________   LABS (all labs ordered are listed, but only abnormal results are displayed)  Labs Reviewed  CBC WITH DIFFERENTIAL/PLATELET - Abnormal; Notable for the following:       Result Value   WBC 17.4 (*)    MCV 78.5 (*)    MCH 25.7 (*)    RDW 14.7 (*)    Neutro Abs 9.9 (*)    Lymphs Abs 5.6 (*)    Monocytes Absolute 1.6 (*)    All other components within normal limits  BASIC METABOLIC PANEL   ____________________________________________  EKG   ____________________________________________  RADIOLOGY Study Result   CLINICAL DATA:  Status post 2 falls today. Hit head. Initial encounter.  EXAM: CT HEAD WITHOUT CONTRAST  TECHNIQUE: Contiguous axial images were obtained from the base of the skull through the vertex without intravenous contrast.  COMPARISON:  MRI of the brain performed 07/16/2009  FINDINGS: Brain: No evidence of acute infarction, hemorrhage, hydrocephalus, extra-axial collection or mass lesion/mass effect.  Prominence of the ventricles and sulci reflects mild to moderate cortical volume loss. Mild cerebellar atrophy is noted. Scattered periventricular and subcortical white matter change likely reflects small vessel ischemic  microangiopathy.  The brainstem and fourth ventricle are within normal limits. The basal ganglia are unremarkable in appearance. The cerebral hemispheres demonstrate grossly normal gray-white differentiation. No mass effect or midline shift is seen.  Vascular: No hyperdense vessel or unexpected calcification.  Skull: There is no evidence of fracture; visualized osseous structures are unremarkable in appearance.  Sinuses/Orbits: The visualized portions of the orbits are within normal limits. There is opacification of the maxillary sinuses bilaterally. The remaining paranasal sinuses and mastoid air cells are well-aerated.  Other: No significant soft tissue abnormalities are seen.  IMPRESSION: 1. No acute intracranial pathology seen on CT. 2. Mild to moderate cortical volume loss and scattered small vessel ischemic microangiopathy. 3. Opacification of the maxillary sinuses bilaterally.   Electronically Signed   By: Garald Balding M.D.     ____________________________________________   PROCEDURES  Procedure(s) performed:   Procedures  Critical Care performed:   ____________________________________________  INITIAL IMPRESSION / ASSESSMENT AND PLAN / ED COURSE  Pertinent labs & imaging results that were available during my care of the patient were reviewed by me and considered in my medical decision making (see chart for details).  Dr. Dahlia Client will follow-up on the MRI      ____________________________________________   FINAL CLINICAL IMPRESSION(S) / ED DIAGNOSES  Final diagnoses:  Pain      NEW MEDICATIONS STARTED DURING THIS VISIT:  New Prescriptions   No medications on file     Note:  This document was prepared using Dragon voice recognition software and may include unintentional dictation errors.    Nena Polio, MD 03/22/16 [phone]

## 2016-03-21 NOTE — ED Triage Notes (Signed)
Pt to ED via EMS from home with c/o back pain radiating into lower extremities x1 week, pt states he has had decrease in mobility and increase in falls at home from back pain. Pt scheduled MRI on Wednesday but unable to relieve pain at home. Pt A&Ox4, VS stable

## 2016-03-21 NOTE — ED Notes (Signed)
ED Provider at bedside. 

## 2016-03-22 ENCOUNTER — Emergency Department: Payer: Medicare HMO

## 2016-03-22 MED ORDER — LIDOCAINE 5 % EX PTCH: 1.0000 | MEDICATED_PATCH | Freq: Two times a day (BID) | CUTANEOUS | 0 refills | Status: DC

## 2016-03-22 MED ORDER — LIDOCAINE 5 % EX PTCH
1.0000 | MEDICATED_PATCH | CUTANEOUS | Status: DC
  Administered 2016-03-22: 1 via TRANSDERMAL
  Filled 2016-03-22: qty 1

## 2016-03-22 NOTE — ED Provider Notes (Signed)
-----------------------------------------   1:46 AM on 03/22/2016 -----------------------------------------   Blood pressure (!) 181/92, pulse 73, temperature 97.8 F (36.6 C), temperature source Oral, resp. rate 18, height 5\' 8"  (1.727 m), weight 155 lb (70.3 kg), SpO2 96 %.  Assuming care from Dr. Cinda Quest.  In short, [name] [name] is a 76 y.o. male with a chief complaint of Back Pain and Fall .  Refer to the original H&P for additional details.  The current plan of care is to follow up the results of the MRI.   Clinical Course as of Mar 22 144  Tue Mar 22, 2016  0137 1. Multilevel degenerative disc disease, unchanged from the prior study. 2. Foraminal stenosis is greatest at right L2 and bilateral L4. 3. Extraforaminal disc osteophyte complexes on the left at L1-L2 and L2-L3 may contact the exiting nerve roots. 4. No spinal canal stenosis. 5. No acute finding.   MR LUMBAR SPINE WO CONTRAST [AW]    Clinical Course User Index [AW] Loney Hering, MD   Patient will be discharged to home to follow-up with his physician.   Loney Hering, MD 03/22/16 [phone]

## 2016-03-22 NOTE — ED Notes (Addendum)
Patient discharge and follow up information reviewed with patient by ED nursing staff and patient given the opportunity to ask questions pertaining to ED visit and discharge plan of care. Patient advised that should symptoms not continue to improve, resolve entirely, or should new symptoms develop then a follow up visit with their PCP or a return visit to the ED may be warranted. Patient verbalized consent and understanding of discharge plan of care including potential need for further evaluation. Patient being discharged in stable condition per attending ED physician on duty.    Pt provided instructions on the proper use of a walker.

## 2016-03-22 NOTE — ED Notes (Signed)
Pt and wife asked for refreshments and crackers; pt and wife given Sprite and graham crackers.

## 2016-03-22 NOTE — Discharge Instructions (Signed)
Please follow-up with your orthopedic surgeon for further evaluation of her back pain.

## 2016-03-23 ENCOUNTER — Telehealth (INDEPENDENT_AMBULATORY_CARE_PROVIDER_SITE_OTHER): Payer: Self-pay | Admitting: Radiology

## 2016-03-23 NOTE — Telephone Encounter (Signed)
Can you please call and cancel the MRI scheduled for 03/25/16?  Pt had it done emergently already.  Thanks-

## 2016-03-23 NOTE — Telephone Encounter (Signed)
I s/w patient and he sees pain management doctor, [address], Dr Mohammed Kindle.  I will send notes to them about MRI Lsp, done emergently last night.  Dr Marlou Sa reviewed the MRI scan and ordered ESI, pt goes to see Dr Primus Bravo on 03/31/2016, and will discuss the injection with him. I will have Sabrina cancel the MRI we ordered/scheduled.

## 2016-03-23 NOTE — Telephone Encounter (Signed)
done

## 2016-03-24 ENCOUNTER — Encounter (INDEPENDENT_AMBULATORY_CARE_PROVIDER_SITE_OTHER): Payer: Self-pay | Admitting: Radiology

## 2016-03-24 NOTE — Telephone Encounter (Signed)
Letter and notes sent to Dr Primus Bravo office, ph [phone], fax [phone].

## 2016-03-25 ENCOUNTER — Ambulatory Visit: Payer: Medicare HMO

## 2016-03-30 ENCOUNTER — Ambulatory Visit (INDEPENDENT_AMBULATORY_CARE_PROVIDER_SITE_OTHER): Payer: Medicare HMO | Admitting: Orthopedic Surgery

## 2016-03-30 ENCOUNTER — Encounter (INDEPENDENT_AMBULATORY_CARE_PROVIDER_SITE_OTHER): Payer: Self-pay | Admitting: Orthopedic Surgery

## 2016-03-30 DIAGNOSIS — M5136 Other intervertebral disc degeneration, lumbar region: Secondary | ICD-10-CM | POA: Diagnosis not present

## 2016-03-30 DIAGNOSIS — M48062 Spinal stenosis, lumbar region with neurogenic claudication: Secondary | ICD-10-CM | POA: Diagnosis not present

## 2016-03-30 NOTE — Progress Notes (Signed)
Office Visit Note   Patient: [name] [name]           Date of Birth: [birth year]           MRN: [id number] Visit Date: 03/30/2016 Requested by: No referring provider defined for this encounter. PCP: Debbora Dus, MD  Subjective: Chief Complaint  Patient presents with  . Lower Back - Pain    HPI [name] [name] is a 76 year old patient with low back pain and right hip pain here to follow-up for lumbar MRI scan.  He reports continued right-sided low back pain.  He does have on the MRI scan foraminal stenosis at L2 worse on the right and bilaterally at L4.  At scan is reviewed with the patient today.  Pictures are reviewed.  Pathology is shown to the patient.  He is using a cane.  He is on oxycodone which helps some.  He did have a right hip injection with afternoon to 1418 and that injection helped him significantly for the first 10 hours then he developed a lot of spasm in his back.              Review of Systems All systems reviewed are negative as they relate to the chief complaint within the history of present illness.  Patient denies  fevers or chills.    Assessment & Plan: Visit Diagnoses:  1. Spinal stenosis, lumbar region, with neurogenic claudication   2. DDD (degenerative disc disease), lumbar     Plan: Impression is low back pain with right-sided L2 foraminal stenosis which I think is probably contributing to some of his symptoms.  He also has hip arthritis and got pretty good results from the hip injection.  I think at some point in the future he may need to have his hip replaced but he is very reluctant to do that based on his age.  Some of these pains are likely coming from his back as well.  I gave him a copy of the MRI report tomorrow.  He is seeing Dr. Primus Bravo tomorrow for reevaluation.  I think directed injections could be helpful for him.  I'll see him back as needed  Follow-Up Instructions: No Follow-up on file.   Orders:  No orders of the defined types were placed in  this encounter.  No orders of the defined types were placed in this encounter.     Procedures: No procedures performed   Clinical Data: No additional findings.  Objective: Vital Signs: There were no vitals taken for this visit.  Physical Exam   Constitutional: Patient appears well-developed HEENT:  Head: Normocephalic Eyes:EOM are normal Neck: Normal range of motion Cardiovascular: Normal rate Pulmonary/chest: Effort normal Neurologic: Patient is alert Skin: Skin is warm Psychiatric: Patient has normal mood and affect    Ortho Exam orthopedic exam demonstrates pretty reasonable right and left hip range of motion.  Good ankle dorsilexion plantar flexion strength.  He has palpable pedal pulses and positive nerve retention signs on the right negative on the left.  No muscle atrophy in the legs.  Knee range of motion pretty reasonable bilaterally.  Specialty Comments:  No specialty comments available.  Imaging: No results found.   PMFS History: Patient Active Problem List   Diagnosis Date Noted  . Chronic right-sided low back pain with right-sided sciatica 03/14/2016  . Chronic pain of right knee 03/14/2016  . DDD (degenerative disc disease), lumbar 07/07/2014  . DDD (degenerative disc disease), cervical 07/07/2014  . DJD (degenerative joint disease) of  knee 07/07/2014  . Bilateral occipital neuralgia 07/07/2014  . Spinal stenosis, lumbar region, with neurogenic claudication 07/07/2014   Past Medical History:  Diagnosis Date  . Renal disorder    kidney stones    Family History  Problem Relation Age of Onset  . Diabetes Mother   . Diabetes Brother     Past Surgical History:  Procedure Laterality Date  . INGUINAL HERNIA REPAIR    . KNEE SURGERY    . NECK SURGERY     Social History   Occupational History  . Not on file.   Social History Main Topics  . Smoking status: Current Every Day Smoker    Packs/day: 0.75    Types: Cigarettes  . Smokeless  tobacco: Never Used  . Alcohol use No  . Drug use: No  . Sexual activity: Not on file

## 2016-03-31 ENCOUNTER — Other Ambulatory Visit: Payer: Self-pay | Admitting: Pain Medicine

## 2016-03-31 ENCOUNTER — Ambulatory Visit (INDEPENDENT_AMBULATORY_CARE_PROVIDER_SITE_OTHER): Payer: Medicare HMO | Admitting: Orthopedic Surgery

## 2016-05-16 ENCOUNTER — Ambulatory Visit (INDEPENDENT_AMBULATORY_CARE_PROVIDER_SITE_OTHER): Payer: Medicare HMO | Admitting: Family Medicine

## 2016-05-16 ENCOUNTER — Encounter: Payer: Self-pay | Admitting: Family Medicine

## 2016-05-16 VITALS — BP 142/60 | HR 71 | Temp 97.7°F | Resp 12 | Ht 68.0 in | Wt 158.0 lb

## 2016-05-16 DIAGNOSIS — R739 Hyperglycemia, unspecified: Secondary | ICD-10-CM | POA: Diagnosis not present

## 2016-05-16 DIAGNOSIS — M48062 Spinal stenosis, lumbar region with neurogenic claudication: Secondary | ICD-10-CM

## 2016-05-16 DIAGNOSIS — M25561 Pain in right knee: Secondary | ICD-10-CM

## 2016-05-16 DIAGNOSIS — R209 Unspecified disturbances of skin sensation: Secondary | ICD-10-CM

## 2016-05-16 DIAGNOSIS — G8929 Other chronic pain: Secondary | ICD-10-CM | POA: Diagnosis not present

## 2016-05-16 LAB — POCT GLYCOSYLATED HEMOGLOBIN (HGB A1C): Hemoglobin A1C: 5.7

## 2016-05-16 MED ORDER — GABAPENTIN 100 MG PO CAPS: 200.0000 mg | ORAL_CAPSULE | Freq: Every day | ORAL | 3 refills | Status: DC

## 2016-05-16 NOTE — Progress Notes (Signed)
HPI:   [name] is a 76 y.o. male, who is here today to establish care with me.  Former PCP: Dr [name] [name]. Last preventive routine visit: 2013.  Chronic medical problems: Chronic pain,tobacco use disorder, and OA among some. Right knee pain for about 8 years  He lives alone,his wife passed 07/2015.  He cooks sometimes but eats out most of the time.  He is still working,house remodeling.  . Independent ADL's and IADL's. No falls in the past year and denies depression symptoms.  Concerns today:   Bilateral LE pain, stinging and burning sensation on distal LE, posterior and lateral aspect. Calves achy sensation at night that keeps him from sleep. Pain is "not bad." He is not sure about exacerbating or alleviating factors, stinging sensation is constant. Knee pain is exacerbated by prolonged walking or standing.Alleviated by rest.  Lower back pain radiated to right hip.  He has had this problem for years and according to pt, Dr [name] [name] is planning on giving him a "shot."  Hx of spinal stenosis,he has followed with ortho,Dr [name] [name]. He denies saddle anesthesia or changes in urine/bowel continence.  Denies fever,chills,or recent trauma.  According to pt, he has been evaluated at the "[facility]" in [place] l and test was "fine."  Lumbar MRI 03/2016: 1. Multilevel degenerative disc disease, unchanged from the prior study. 2. Foraminal stenosis is greatest at right L2 and bilateral L4. 3. Extraforaminal disc osteophyte complexes on the left at L1-L2 and L2-L3 may contact the exiting nerve roots. 4. No spinal canal stenosis. 5. No acute finding.     Review of Systems  Constitutional: Negative for activity change, appetite change, fatigue, fever and unexpected weight change.  HENT: Negative for nosebleeds, sore throat and trouble swallowing.   Eyes: Negative for redness and visual disturbance.  Respiratory: Negative for cough, shortness of breath and  wheezing.   Cardiovascular: Negative for chest pain, palpitations and leg swelling.  Gastrointestinal: Negative for abdominal pain, nausea and vomiting.  Genitourinary: Negative for decreased urine volume and hematuria.  Musculoskeletal: Positive for arthralgias and back pain.  Skin: Negative for color change and rash.  Neurological: Positive for numbness. Negative for seizures, weakness and headaches.  Psychiatric/Behavioral: Positive for sleep disturbance (Due to LE pain). Negative for confusion. The patient is nervous/anxious.       Current Outpatient Prescriptions on File Prior to Visit  Medication Sig Dispense Refill  . Oxycodone HCl 10 MG TABS Limit1/2 to 1 tablet by mouth 3 to 6 times per day if tolerated (Patient taking differently: Take 10 mg by mouth 2 (two) times daily. ) 180 tablet 0  . albuterol (PROVENTIL HFA;VENTOLIN HFA) 108 (90 Base) MCG/ACT inhaler Inhale 2 puffs into the lungs every 6 (six) hours as needed for wheezing or shortness of breath. (Patient not taking: Reported on 05/16/2016) 1 Inhaler 2   No current facility-administered medications on file prior to visit.      Past Medical History:  Diagnosis Date  . Renal disorder    kidney stones   Allergies  Allergen Reactions  . No Known Allergies     Family History  Problem Relation Age of Onset  . Diabetes Mother   . Diabetes Brother   . Diabetes Sister     Social History   Social History  . Marital status: Widowed    Spouse name: N/A  . Number of children: N/A  . Years of education: N/A   Social History Main Topics  .  Smoking status: Current Every Day Smoker    Packs/day: 0.75    Types: Cigarettes  . Smokeless tobacco: Never Used  . Alcohol use No  . Drug use: No  . Sexual activity: Not Asked   Other Topics Concern  . None   Social History Narrative  . None    Vitals:   05/16/16 1056  BP: (!) 142/60  Pulse: 71  Resp: 12  Temp: 97.7 F (36.5 C)    Body mass index is 24.02  kg/m.  Physical Exam  Nursing note and vitals reviewed. Constitutional: He is oriented to person, place, and time. He appears well-developed and well-nourished. No distress.  HENT:  Head: Atraumatic.  Mouth/Throat: Oropharynx is clear and moist and mucous membranes are normal.  Eyes: Conjunctivae and EOM are normal. Pupils are equal, round, and reactive to light.  Cardiovascular: Normal rate and regular rhythm.   No murmur heard. DP and PT present bilateral.  Respiratory: Effort normal and breath sounds normal. No respiratory distress.  GI: Soft. He exhibits no mass. There is no hepatomegaly. There is no tenderness.  Musculoskeletal: He exhibits no edema.       Lumbar back: He exhibits decreased range of motion.  Decreased flexion and extension of some IP joints. Lower back pain with movent on exam table, not upon palpation. No tenderness upon palpation of calves, Thompson's test negative bilateral.    Lymphadenopathy:    He has no cervical adenopathy.  Neurological: He is alert and oriented to person, place, and time. He has normal strength. No cranial nerve deficit.  Stable gait with no assistance.  Skin: Skin is warm. No rash noted. No erythema.  Psychiatric: His affect is labile (When talking about his wife.).  Appropriately groomed, good eye contact.      ASSESSMENT AND PLAN:   [name] [name] was seen today for establish care.  Diagnoses and all orders for this visit:  Skin sensation disturbance  We discussed possible etiologies: metabolic disorder,PAD,vein disease,radicular among some.  Distal pulses are present. Noted some elevated glucose,elevated Cr,and mildly low Ca++ (Alb not available) in 03/2016. Plan on repeating labs next OV.  Spinal stenosis, lumbar region, with neurogenic claudication  After discussion of treatment options and side effects he agrees with trying Gabapentin. Instructed to titrate dose from 100 mg to 200 mg as tolerated. Fall prevention. F/U  in 2 months.  -     gabapentin (NEURONTIN) 100 MG capsule; Take 2 capsules (200 mg total) by mouth at bedtime.  Hyperglycemia  A1C today 5.7.  -     POCT glycosylated hemoglobin (Hb A1C)  Chronic pain of right knee  Continue following with Dr [name] [name] and Dr [name] [name]. Currently on Oxycodone for chronic pain. Fall prevention discussed.     [name], MD  [facility]. [place] office.

## 2016-05-16 NOTE — Progress Notes (Signed)
Pre visit review using our clinic review tool, if applicable. No additional management support is needed unless otherwise documented below in the visit note. 

## 2016-05-16 NOTE — Patient Instructions (Signed)
A few things to remember from today's visit:   Skin sensation disturbance  Spinal stenosis, lumbar region, with neurogenic claudication - Plan: gabapentin (NEURONTIN) 100 MG capsule  Hyperglycemia - Plan: POCT glycosylated hemoglobin (Hb A1C)   Today Gabapentin added trying to help with leg pain. Start with one cap at bedtime then increase to 2 caps in a week. Fall prevention. Continue following with Dr Primus Bravo.  Consider colonoscopy and pneumonia vaccine. Please be sure medication list is accurate. If a new problem present, please set up appointment sooner than planned today.

## 2016-05-20 ENCOUNTER — Ambulatory Visit: Payer: Medicare HMO | Admitting: Family Medicine

## 2016-05-23 ENCOUNTER — Ambulatory Visit: Payer: Medicare HMO | Admitting: Family Medicine

## 2016-05-23 ENCOUNTER — Ambulatory Visit (INDEPENDENT_AMBULATORY_CARE_PROVIDER_SITE_OTHER): Payer: Medicare HMO | Admitting: Family Medicine

## 2016-05-23 ENCOUNTER — Encounter: Payer: Self-pay | Admitting: Family Medicine

## 2016-05-23 VITALS — BP 136/82 | HR 65 | Temp 97.8°F | Ht 68.0 in | Wt 160.3 lb

## 2016-05-23 DIAGNOSIS — R600 Localized edema: Secondary | ICD-10-CM

## 2016-05-23 NOTE — Patient Instructions (Addendum)
BEFORE YOU LEAVE: -follow up: with PCP in 2-4 weeks  Elevate legs for 30 minutes twice daily.  Compression socks can help.  Avoid high sodium foods.  I hope you are feeling better soon! Seek care immediately if worsening, new concerns or you are not improving with treatment.

## 2016-05-23 NOTE — Progress Notes (Signed)
HPI:   HPI:  Acute visit for bilat ankle edema: -report long hx of similar episodes in the past -does not like to use lasix or compression socks (has at home) -this started 3 days ago -denies: CP, SOB, DOE, palpitations, pain, malaise  ROS: See pertinent positives and negatives per HPI.  Past Medical History:  Diagnosis Date  . Renal disorder    kidney stones    Past Surgical History:  Procedure Laterality Date  . INGUINAL HERNIA REPAIR    . KNEE SURGERY    . NECK SURGERY      Family History  Problem Relation Age of Onset  . Diabetes Mother   . Diabetes Brother   . Diabetes Sister     Social History   Social History  . Marital status: Widowed    Spouse name: N/A  . Number of children: N/A  . Years of education: N/A   Social History Main Topics  . Smoking status: Current Every Day Smoker    Packs/day: 0.75    Types: Cigarettes  . Smokeless tobacco: Never Used  . Alcohol use No  . Drug use: No  . Sexual activity: Not Asked   Other Topics Concern  . None   Social History Narrative  . None     Current Outpatient Prescriptions:  .  albuterol (PROVENTIL HFA;VENTOLIN HFA) 108 (90 Base) MCG/ACT inhaler, Inhale 2 puffs into the lungs every 6 (six) hours as needed for wheezing or shortness of breath., Disp: 1 Inhaler, Rfl: 2 .  gabapentin (NEURONTIN) 100 MG capsule, Take 2 capsules (200 mg total) by mouth at bedtime., Disp: 60 capsule, Rfl: 3 .  Oxycodone HCl 10 MG TABS, Limit1/2 to 1 tablet by mouth 3 to 6 times per day if tolerated (Patient taking differently: Take 10 mg by mouth 2 (two) times daily. ), Disp: 180 tablet, Rfl: 0  EXAM:  Vitals:   05/23/16 1411  BP: 136/82  Pulse: 65  Temp: 97.8 F (36.6 C)    Body mass index is 24.37 kg/m.  GENERAL: vitals reviewed and listed above, alert, oriented, appears well hydrated and in no acute distress  HEENT: atraumatic, conjunttiva clear, no obvious abnormalities on inspection of external nose and  ears  NECK: no obvious masses on inspection  LUNGS: clear to auscultation bilaterally, no wheezes, rales or rhonchi, good air movement  CV: HRRR, no peripheral edema  MS: moves all extremities without noticeable abnormality  PSYCH: pleasant and cooperative, no obvious depression or anxiety  ASSESSMENT AND PLAN:  Discussed the following assessment and plan:  Lower extremity edema  -Patient advised to return or notify a doctor immediately if symptoms worsen or persist or new concerns arise.  Patient Instructions  BEFORE YOU LEAVE: -follow up: with PCP in 2-4 weeks  Elevate legs for 30 minutes twice daily.  Compression socks can help.  Avoid high sodium foods.  I hope you are feeling better soon! Seek care immediately if worsening, new concerns or you are not improving with treatment.      Colin Benton R., DO    ROS: See pertinent positives and negatives per HPI.  Past Medical History:  Diagnosis Date  . Renal disorder    kidney stones    Past Surgical History:  Procedure Laterality Date  . INGUINAL HERNIA REPAIR    . KNEE SURGERY    . NECK SURGERY      Family History  Problem Relation Age of Onset  . Diabetes Mother   . Diabetes  Brother   . Diabetes Sister     Social History   Social History  . Marital status: Widowed    Spouse name: N/A  . Number of children: N/A  . Years of education: N/A   Social History Main Topics  . Smoking status: Current Every Day Smoker    Packs/day: 0.75    Types: Cigarettes  . Smokeless tobacco: Never Used  . Alcohol use No  . Drug use: No  . Sexual activity: Not Asked   Other Topics Concern  . None   Social History Narrative  . None     Current Outpatient Prescriptions:  .  albuterol (PROVENTIL HFA;VENTOLIN HFA) 108 (90 Base) MCG/ACT inhaler, Inhale 2 puffs into the lungs every 6 (six) hours as needed for wheezing or shortness of breath., Disp: 1 Inhaler, Rfl: 2 .  gabapentin (NEURONTIN) 100 MG  capsule, Take 2 capsules (200 mg total) by mouth at bedtime., Disp: 60 capsule, Rfl: 3 .  Oxycodone HCl 10 MG TABS, Limit1/2 to 1 tablet by mouth 3 to 6 times per day if tolerated (Patient taking differently: Take 10 mg by mouth 2 (two) times daily. ), Disp: 180 tablet, Rfl: 0  EXAM:  Vitals:   05/23/16 1411  BP: 136/82  Pulse: 65  Temp: 97.8 F (36.6 C)    Body mass index is 24.37 kg/m.  GENERAL: vitals reviewed and listed above, alert, oriented, appears well hydrated and in no acute distress  HEENT: atraumatic, conjunttiva clear, no obvious abnormalities on inspection of external nose and ears  NECK: no obvious masses on inspection  LUNGS: clear to auscultation bilaterally, no wheezes, rales or rhonchi, good air movement  CV: HRRR, bilat 1+ edema lower legs to 1/3 calf  MS: moves all extremities without noticeable abnormality  PSYCH: pleasant and cooperative, no obvious depression or anxiety  ASSESSMENT AND PLAN:  Discussed the following assessment and plan:  Lower extremity edema  -we discussed possible serious and likely etiologies, workup and treatment, treatment risks and return precautions - suspect mild venous insufficiency given hx same and lack of other symptoms or findings -after this discussion, [name] [name] opted for elevation, consideration compression, avoidance high sodium foods, declines lasix -follow up advised in 2-4 weeks -of course, we advised [name] [name]  to return or notify a doctor immediately if symptoms worsen or persist or new concerns arise.   Patient Instructions  BEFORE YOU LEAVE: -follow up: with PCP in 2-4 weeks  Elevate legs for 30 minutes twice daily.  Compression socks can help.  Avoid high sodium foods.  I hope you are feeling better soon! Seek care immediately if worsening, new concerns or you are not improving with treatment.      Colin Benton R., DO

## 2016-05-23 NOTE — Progress Notes (Signed)
Pre visit review using our clinic review tool, if applicable. No additional management support is needed unless otherwise documented below in the visit note. 

## 2016-06-21 DIAGNOSIS — M545 Low back pain: Secondary | ICD-10-CM | POA: Diagnosis not present

## 2016-06-21 DIAGNOSIS — M25561 Pain in right knee: Secondary | ICD-10-CM | POA: Diagnosis not present

## 2016-06-21 DIAGNOSIS — M5033 Other cervical disc degeneration, cervicothoracic region: Secondary | ICD-10-CM | POA: Diagnosis not present

## 2016-06-21 DIAGNOSIS — M5136 Other intervertebral disc degeneration, lumbar region: Secondary | ICD-10-CM | POA: Diagnosis not present

## 2016-06-21 DIAGNOSIS — M5031 Other cervical disc degeneration,  high cervical region: Secondary | ICD-10-CM | POA: Diagnosis not present

## 2016-06-21 DIAGNOSIS — M1711 Unilateral primary osteoarthritis, right knee: Secondary | ICD-10-CM | POA: Diagnosis not present

## 2016-06-21 DIAGNOSIS — Z79891 Long term (current) use of opiate analgesic: Secondary | ICD-10-CM | POA: Diagnosis not present

## 2016-06-21 DIAGNOSIS — M5137 Other intervertebral disc degeneration, lumbosacral region: Secondary | ICD-10-CM | POA: Diagnosis not present

## 2016-06-30 ENCOUNTER — Encounter: Payer: Self-pay | Admitting: Family Medicine

## 2016-06-30 ENCOUNTER — Ambulatory Visit (INDEPENDENT_AMBULATORY_CARE_PROVIDER_SITE_OTHER): Payer: Medicare HMO | Admitting: Family Medicine

## 2016-06-30 VITALS — BP 126/80 | HR 66 | Resp 12 | Ht 68.0 in | Wt 156.2 lb

## 2016-06-30 DIAGNOSIS — M79605 Pain in left leg: Secondary | ICD-10-CM | POA: Diagnosis not present

## 2016-06-30 DIAGNOSIS — M48062 Spinal stenosis, lumbar region with neurogenic claudication: Secondary | ICD-10-CM | POA: Diagnosis not present

## 2016-06-30 DIAGNOSIS — R600 Localized edema: Secondary | ICD-10-CM

## 2016-06-30 DIAGNOSIS — M79604 Pain in right leg: Secondary | ICD-10-CM

## 2016-06-30 LAB — BASIC METABOLIC PANEL
BUN: 15 mg/dL (ref 6–23)
CO2: 30 mEq/L (ref 19–32)
Calcium: 8.8 mg/dL (ref 8.4–10.5)
Chloride: 107 mEq/L (ref 96–112)
Creatinine, Ser: 1.1 mg/dL (ref 0.40–1.50)
GFR: 83.94 mL/min (ref >60.00)
Glucose, Bld: 87 mg/dL (ref 70–99)
Potassium: 4 mEq/L (ref 3.5–5.1)
Sodium: 141 mEq/L (ref 135–145)

## 2016-06-30 MED ORDER — FUROSEMIDE 20 MG PO TABS: 20.0000 mg | ORAL_TABLET | Freq: Every day | ORAL | 0 refills | Status: DC

## 2016-06-30 NOTE — Progress Notes (Signed)
HPI:   ACUTE VISIT:  Chief Complaint  Patient presents with  . feet & ankle swelling    [name] is a 76 y.o. male, who is here today complaining of 3-4 weeks of LE pain, from knees down, mainly pretibial and edema. He states that in the past he has had LE edema, worse at the end of the day but better with elevation.These times edema has been continuous, worse at the end of the day.  He has seen "vein doctor" in the past and states that work-up was "fine."  In the past Lasix has been prescribed as well as compression stockings. He discontinued diuretic because urinary frequency and cannot tolerate compression stocking.   Leg Pain   The incident occurred more than 1 week ago. Incident location: No Hx of trauma. The pain is present in the right leg and left leg. The pain is at a severity of 9/10. The pain is severe. The pain has been constant since onset. Pertinent negatives include no numbness.   Throbbing pain from knee down,bilateral,constant and keeps him from sleep.  Denies orthopnea or PND.  Denies severe/frequent headache, visual changes, chest pain, dyspnea, palpitation, claudication, or focal weakness. Denies dysuria,increased urinary frequency, gross hematuria,or decreased urine output.  05/23/2016 visit for LE edema. Hx of lower back pain with radiation to RLE, denies numbness. States that distal leg pain he is reporting today is different to the one he has related to his back.  I saw him on 05/16/16 and at that time he was c/o burning and stinging sensation on posterior and lateral aspect of both LE's as well as achy sensation. I recommended Gabapentin, he discontinued it. According to pt, Dr Primus Bravo recommended stopping medication because could be causing his leg edema.   Review of Systems  Constitutional: Positive for fatigue. Negative for activity change, appetite change, fever and unexpected weight change.  HENT: Negative for nosebleeds, sore  throat and trouble swallowing.   Eyes: Negative for redness and visual disturbance.  Respiratory: Negative for shortness of breath and wheezing.   Cardiovascular: Positive for leg swelling. Negative for chest pain and palpitations.  Gastrointestinal: Negative for abdominal pain, nausea and vomiting.  Endocrine: Negative for cold intolerance, heat intolerance, polydipsia, polyphagia and polyuria.  Genitourinary: Negative for decreased urine volume, dysuria and hematuria.  Musculoskeletal: Positive for back pain, gait problem and myalgias.  Skin: Negative for rash and wound.  Neurological: Negative for syncope, weakness, numbness and headaches.  Psychiatric/Behavioral: Positive for sleep disturbance. Negative for confusion. The patient is nervous/anxious.      Current Outpatient Prescriptions on File Prior to Visit  Medication Sig Dispense Refill  . albuterol (PROVENTIL HFA;VENTOLIN HFA) 108 (90 Base) MCG/ACT inhaler Inhale 2 puffs into the lungs every 6 (six) hours as needed for wheezing or shortness of breath. 1 Inhaler 2  . gabapentin (NEURONTIN) 100 MG capsule Take 2 capsules (200 mg total) by mouth at bedtime. 60 capsule 3  . Oxycodone HCl 10 MG TABS Limit1/2 to 1 tablet by mouth 3 to 6 times per day if tolerated (Patient taking differently: Take 10 mg by mouth 2 (two) times daily. ) 180 tablet 0   No current facility-administered medications on file prior to visit.      Past Medical History:  Diagnosis Date  . Renal disorder    kidney stones   Allergies  Allergen Reactions  . No Known Allergies     Social History   Social History  .  Marital status: Widowed    Spouse name: N/A  . Number of children: N/A  . Years of education: N/A   Social History Main Topics  . Smoking status: Current Every Day Smoker    Packs/day: 0.75    Types: Cigarettes  . Smokeless tobacco: Never Used  . Alcohol use No  . Drug use: No  . Sexual activity: Not Asked   Other Topics Concern  .  None   Social History Narrative  . None    Vitals:   06/30/16 1120  BP: 126/80  Pulse: 66  Resp: 12  O2 sat 95% at RA. Body mass index is 23.76 kg/m.   Physical Exam  Nursing note and vitals reviewed. Constitutional: He is oriented to person, place, and time. He appears well-developed. No distress.  HENT:  Head: Atraumatic.  Mouth/Throat: Oropharynx is clear and moist and mucous membranes are normal.  Eyes: Conjunctivae and EOM are normal.  Neck: No JVD present.  Cardiovascular: Normal rate and regular rhythm.   No murmur heard. DP pulses present bilateral. A few telangiectasis appreciated on LE, bilateral.  Respiratory: Effort normal and breath sounds normal. No respiratory distress.  GI: Soft. He exhibits no mass. There is no hepatomegaly. There is no tenderness.  Musculoskeletal: He exhibits edema (1+ pitting LE edema bilateral, 2+ pedal edema bilateral.).  Holman's test negative bilateral but c/o "little soreness" upon palpation of calves. No tightness or erythema appreciated, no significant differences in diameter.  No signs of synovitis.   Lymphadenopathy:    He has no cervical adenopathy.  Neurological: He is alert and oriented to person, place, and time. He has normal strength.  Mildly unstable gait, antalgic, not assisted.  Skin: Skin is warm. No rash noted. No erythema.  Psychiatric: His mood appears anxious.  Fairly groomed, good eye contact.    ASSESSMENT AND PLAN:  Olsen was seen today for feet & ankle swelling.  Diagnoses and all orders for this visit:  Bilateral leg edema  We discussed possible etiologies, Hx and examination suggest vein disease but because "soreness" on palpation of calves and reporting problem as "new" I recommended LE doppler to evaluate for DVT. He refused test, does not have time. Compression stocking recommended and low dose Lasix as needed daily, some side effects discussed. Low salt diet. Further recommendations will be  given according to lab results.  F/U in 6 weeks.  -     Basic metabolic panel -     Microalbumin / creatinine urine ratio -     furosemide (LASIX) 20 MG tablet; Take 1 tablet (20 mg total) by mouth daily. Daily for a week then as needed.  Pain in both lower extremities  Vein disease, radicular pain among some to consider.  Instructed about warning signs.   Spinal stenosis, lumbar region, with neurogenic claudication  Most likely contributing to problem. Continue following with pain management. Gabapentin discontinued by provider.    -Mr.Raiford Noble Raisch advised to seek immediate medical attention is symptoms suddenly get worse. F/U in 6 weeks, before if needed.        Errica Dutil G. [name], MD  [facility]. [place] office.

## 2016-06-30 NOTE — Patient Instructions (Signed)
A few things to remember from today's visit:   Bilateral leg edema - Plan: Basic metabolic panel, Microalbumin / creatinine urine ratio, furosemide (LASIX) 20 MG tablet  Pain in both lower extremities  Lower extremities elevation.  Low salt diet.   Please be sure medication list is accurate. If a new problem present, please set up appointment sooner than planned today.

## 2016-07-06 DIAGNOSIS — H40003 Preglaucoma, unspecified, bilateral: Secondary | ICD-10-CM | POA: Diagnosis not present

## 2016-07-13 DIAGNOSIS — H401132 Primary open-angle glaucoma, bilateral, moderate stage: Secondary | ICD-10-CM | POA: Diagnosis not present

## 2016-07-19 DIAGNOSIS — M5033 Other cervical disc degeneration, cervicothoracic region: Secondary | ICD-10-CM | POA: Diagnosis not present

## 2016-07-19 DIAGNOSIS — M1711 Unilateral primary osteoarthritis, right knee: Secondary | ICD-10-CM | POA: Diagnosis not present

## 2016-07-19 DIAGNOSIS — M545 Low back pain: Secondary | ICD-10-CM | POA: Diagnosis not present

## 2016-07-19 DIAGNOSIS — M5137 Other intervertebral disc degeneration, lumbosacral region: Secondary | ICD-10-CM | POA: Diagnosis not present

## 2016-07-19 DIAGNOSIS — Z79891 Long term (current) use of opiate analgesic: Secondary | ICD-10-CM | POA: Diagnosis not present

## 2016-07-19 DIAGNOSIS — M5136 Other intervertebral disc degeneration, lumbar region: Secondary | ICD-10-CM | POA: Diagnosis not present

## 2016-07-19 DIAGNOSIS — M5031 Other cervical disc degeneration,  high cervical region: Secondary | ICD-10-CM | POA: Diagnosis not present

## 2016-07-19 DIAGNOSIS — M25561 Pain in right knee: Secondary | ICD-10-CM | POA: Diagnosis not present

## 2016-07-22 ENCOUNTER — Ambulatory Visit: Payer: Medicare HMO | Admitting: Family Medicine

## 2016-08-10 DIAGNOSIS — H2513 Age-related nuclear cataract, bilateral: Secondary | ICD-10-CM | POA: Diagnosis not present

## 2016-08-11 NOTE — Progress Notes (Signed)
HPI:   [name] is a 76 y.o. male, who is here today to follow on recent OV.   He was seen on 06/30/16, when he was c/o LE pain and worsening edema. Hx of chronic back/joint pain and vein disease. In the past he has been on Lasix and compression stocking were also recommended.  Recommended resuming Lasix, which he did for a few days but did not seem to help with edema.  Edema is now peri ankle and pedal, not longer involving pretibial area. It is worse at the end of the day, mainly when he has been walking or standing all day. He has not identified alleviating factors.  He did not try compression stocking as recommended, he cannot tolerate them and they are hard to put on.  He is still having LE throbbing severe pain, constant but worse at night and depending of his position in bed. He states that pain is from  "calves pain down to heel",bilateral.   Reviewing records with pt, it seems like he discussed epidural injection with Dr [name] [name], ortho, in 03/2016. He states that he was feeling better after OV,so decided not to pursue injection.  He is not interested in scheduling a f/u visit, states that he cannot afford co-pays and he prefers to continue following with Dr [name] [name], pain management, who has also giving him injections in the past.  He sees Dr [name] [name], monthly. I do not see recent visit, he tells me that he moved his practice and his next appt is this coming Monday.  He discontinued Gabapentin, not clear about reason, he does not recall side effects. Currently he is on Oxycodone 10 mg 1/2-1 tab qid as needed.  He has not had a routine physical or preventive Medicare visit, he is not interested in neither one. He just changed health insurance from [place] to [place] and prefer to hold on any procedure or lab for now.  He has smoked since age 81, max 2 PPD currently 1/2 PPD. He still works full time, Architect. He lives alone, drives and takes care of his  finances. His son lives in [place].  He is planning on cataract surgery left eye next week and his sister will drive him.  Noted elevated leukocytes for the past year, 14.4 to 20.0.  He denies fever, night sweats, or abnormal wt loss.    Lab Results  Component Value Date   WBC 17.4 (H) 03/21/2016   HGB 15.1 03/21/2016   HCT 46.3 03/21/2016   MCV 78.5 (L) 03/21/2016   PLT 289 03/21/2016     Review of Systems  Constitutional: Negative for activity change, appetite change, fatigue, fever and unexpected weight change.  HENT: Negative for mouth sores, nosebleeds, sore throat and trouble swallowing.   Eyes: Negative for redness and visual disturbance.  Respiratory: Negative for cough, shortness of breath and wheezing.   Cardiovascular: Positive for leg swelling. Negative for chest pain and palpitations.  Gastrointestinal: Negative for abdominal pain, nausea and vomiting.       No changes in bowel habits.  Endocrine: Negative for polydipsia, polyphagia and polyuria.  Genitourinary: Negative for decreased urine volume and hematuria.  Musculoskeletal: Positive for arthralgias, back pain, gait problem and myalgias. Negative for joint swelling.  Skin: Negative for rash and wound.  Neurological: Negative for dizziness, syncope, weakness and headaches.  Psychiatric/Behavioral: Positive for sleep disturbance. Negative for confusion. The patient is not nervous/anxious.      Current Outpatient Prescriptions on  File Prior to Visit  Medication Sig Dispense Refill  . albuterol (PROVENTIL HFA;VENTOLIN HFA) 108 (90 Base) MCG/ACT inhaler Inhale 2 puffs into the lungs every 6 (six) hours as needed for wheezing or shortness of breath. 1 Inhaler 2   No current facility-administered medications on file prior to visit.      Past Medical History:  Diagnosis Date  . Renal disorder    kidney stones   Allergies  Allergen Reactions  . No Known Allergies     Social History   Social History   . Marital status: Widowed    Spouse name: N/A  . Number of children: N/A  . Years of education: N/A   Social History Main Topics  . Smoking status: Current Every Day Smoker    Packs/day: 0.75    Types: Cigarettes  . Smokeless tobacco: Never Used  . Alcohol use No  . Drug use: No  . Sexual activity: Not Asked   Other Topics Concern  . None   Social History Narrative  . None    Vitals:   08/12/16 1337  BP: 130/70  Pulse: 91  Resp: 12   Body mass index is 23.43 kg/m.   Physical Exam  Nursing note and vitals reviewed. Constitutional: He is oriented to person, place, and time. He appears well-developed and well-nourished. No distress.  HENT:  Head: Atraumatic.  Mouth/Throat: Oropharynx is clear and moist and mucous membranes are normal.  Eyes: Conjunctivae and EOM are normal. Pupils are equal.  Sluggish pupil reaction.  Cardiovascular: Normal rate and regular rhythm.   No murmur heard. A few varicose veins on LE, bilateral. Holman's sign negative bilateral.DP pulses present. Normal capillary refill.   Respiratory: Effort normal and breath sounds normal. No respiratory distress.  GI: Soft. He exhibits no abdominal bruit and no mass. There is no hepatomegaly. There is no tenderness.  Musculoskeletal: He exhibits edema (pedal 1+ and peri ankle piting edema, LE, bilateral). He exhibits no tenderness.  Lymphadenopathy:    He has no cervical adenopathy.  Neurological: He is alert and oriented to person, place, and time. He has normal strength.  Antalgic, mildly ustable gait with no assistance.  Skin: Skin is warm. No rash noted. No erythema.  Psychiatric: His affect is blunt.  Appropiately groomed, poor eye contact.     ASSESSMENT AND PLAN:   [name] was seen today for follow-up.  Diagnoses and all orders for this visit:  Spinal stenosis, lumbar region, with neurogenic claudication  We discussed Dx and prognosis. This is most likely causing LE pain. We  discussed some treatment options: Epidural injections is appropriate and discussed with Dr [name] [name] 03/2016, pharmacologic treatments like Gabapentin and/or Cymbalta. Some side effects discussed but at this time given his severe pain affecting sleep benefit seems grater than risk. He states that he has tried many medications, he is tired of be a "gunni pig" and take medications that will not help. He tells me that he will discuss epidural injections with Dr [name] [name] next OV.   Bilateral lower extremity edema  Compared with last OV edema has improved. It is most likely vein related. Other possible etiologies discussed, I do not think further imaging is needed today. He is not interested in labs work. He is not interested in compression stocking, he does not want to take diuretics, and he is not interested in vascular evaluation. Edema is mild now and skin is intact. LE elevation a few times during the afternoon may be enough.  Healthcare  maintenance  He has not had screening for AAA, not sure about colonoscopy, vaccines are not up to date among some. He is not interested in screening test, prefers to wait until he gets his new health insurance for now. Strongly recommend smoking cessation. He prefers 6 months f/u instead 3-4 months.  Leukocytosis, unspecified type  He is not interested in lab work today. Last CBC WBC's 17.4 (20.5,15.5,and 14.4 a year ago), he is not sure about acute symptoms at the time last were done or recent steroid course. Will plan on rechecking next OV.     Debrah Granderson G. [name], MD  [facility]. [place] office.

## 2016-08-12 ENCOUNTER — Ambulatory Visit (INDEPENDENT_AMBULATORY_CARE_PROVIDER_SITE_OTHER): Payer: Medicare HMO | Admitting: Family Medicine

## 2016-08-12 ENCOUNTER — Encounter: Payer: Self-pay | Admitting: Family Medicine

## 2016-08-12 VITALS — BP 130/70 | HR 91 | Resp 12 | Ht 68.0 in | Wt 154.1 lb

## 2016-08-12 DIAGNOSIS — R600 Localized edema: Secondary | ICD-10-CM | POA: Diagnosis not present

## 2016-08-12 DIAGNOSIS — M48062 Spinal stenosis, lumbar region with neurogenic claudication: Secondary | ICD-10-CM | POA: Diagnosis not present

## 2016-08-12 DIAGNOSIS — Z0000 Encounter for general adult medical examination without abnormal findings: Secondary | ICD-10-CM

## 2016-08-12 DIAGNOSIS — D72829 Elevated white blood cell count, unspecified: Secondary | ICD-10-CM

## 2016-08-12 NOTE — Patient Instructions (Signed)
A few things to remember from today's visit:   Spinal stenosis, lumbar region, with neurogenic claudication  Bilateral lower extremity edema  Abdominal aortic aneurysm screening recommended, so we will hold on labs/test until your new insurance.   Please be sure medication list is accurate. If a new problem present, please set up appointment sooner than planned today.

## 2016-08-19 NOTE — Discharge Instructions (Signed)

## 2016-08-22 ENCOUNTER — Ambulatory Visit: Payer: Medicare HMO | Admitting: Anesthesiology

## 2016-08-22 ENCOUNTER — Ambulatory Visit
Admission: RE | Admit: 2016-08-22 | Discharge: 2016-08-22 | Disposition: A | Discharge status: Home / Self Care | Payer: Medicare HMO | Source: Ambulatory Visit | Attending: Ophthalmology | Admitting: Ophthalmology

## 2016-08-22 ENCOUNTER — Encounter
Admission: RE | Disposition: A | Discharge status: Home / Self Care | Payer: Self-pay | Source: Ambulatory Visit | Attending: Ophthalmology

## 2016-08-22 DIAGNOSIS — H401122 Primary open-angle glaucoma, left eye, moderate stage: Secondary | ICD-10-CM | POA: Diagnosis not present

## 2016-08-22 DIAGNOSIS — H2512 Age-related nuclear cataract, left eye: Secondary | ICD-10-CM | POA: Diagnosis not present

## 2016-08-22 HISTORY — PX: CATARACT EXTRACTION W/PHACO: SHX586

## 2016-08-22 HISTORY — DX: Dorsalgia, unspecified: M54.9

## 2016-08-22 SURGERY — PHACOEMULSIFICATION, CATARACT, WITH IOL INSERTION
Anesthesia: Monitor Anesthesia Care | Laterality: Left | Wound class: Clean

## 2016-08-22 MED ORDER — ARMC OPHTHALMIC DILATING DROPS
1.0000 "application " | OPHTHALMIC | Status: DC | PRN
  Administered 2016-08-22 (×3): 1 via OPHTHALMIC

## 2016-08-22 MED ORDER — SODIUM HYALURONATE 23 MG/ML IO SOLN
INTRAOCULAR | Status: DC | PRN
  Administered 2016-08-22: 0.6 mL via INTRAOCULAR

## 2016-08-22 MED ORDER — LIDOCAINE HCL (PF) 2 % IJ SOLN
INTRAOCULAR | Status: DC | PRN
  Administered 2016-08-22: 1 mL via INTRAOCULAR

## 2016-08-22 MED ORDER — LACTATED RINGERS IV SOLN: INTRAVENOUS | Status: DC

## 2016-08-22 MED ORDER — SODIUM HYALURONATE 10 MG/ML IO SOLN
INTRAOCULAR | Status: DC | PRN
  Administered 2016-08-22: 0.55 mL via INTRAOCULAR

## 2016-08-22 MED ORDER — MIDAZOLAM HCL 2 MG/2ML IJ SOLN
INTRAMUSCULAR | Status: DC | PRN
  Administered 2016-08-22: 2 mg via INTRAVENOUS

## 2016-08-22 MED ORDER — ACETAMINOPHEN 160 MG/5ML PO SOLN: 325.0000 mg | ORAL | Status: DC | PRN

## 2016-08-22 MED ORDER — MOXIFLOXACIN HCL 0.5 % OP SOLN
OPHTHALMIC | Status: DC | PRN
  Administered 2016-08-22: 0.2 mL via OPHTHALMIC

## 2016-08-22 MED ORDER — ACETAMINOPHEN 325 MG PO TABS
325.0000 mg | ORAL_TABLET | ORAL | Status: DC | PRN
  Administered 2016-08-22: 650 mg via ORAL

## 2016-08-22 MED ORDER — FENTANYL CITRATE (PF) 100 MCG/2ML IJ SOLN
INTRAMUSCULAR | Status: DC | PRN
  Administered 2016-08-22 (×2): 50 ug via INTRAVENOUS

## 2016-08-22 SURGICAL SUPPLY — 19 items
CANNULA ANT/CHMB 27G (MISCELLANEOUS) ×1 IMPLANT
CANNULA ANT/CHMB 27GA (MISCELLANEOUS) ×2 IMPLANT
DISSECTOR HYDRO NUCLEUS 50X22 (MISCELLANEOUS) ×2 IMPLANT
GLOVE BIO SURGEON STRL SZ8 (GLOVE) ×2 IMPLANT
GLOVE SURG LX 7.5 STRW (GLOVE) ×1
GLOVE SURG LX STRL 7.5 STRW (GLOVE) ×1 IMPLANT
GOWN STRL REUS W/ TWL LRG LVL3 (GOWN DISPOSABLE) ×2 IMPLANT
GOWN STRL REUS W/TWL LRG LVL3 (GOWN DISPOSABLE) ×4
ISTENT OPTHAL 1X.33 ADULT LF (Permanent Stent) ×2 IMPLANT
LENS IOL TECNIS ITEC 17.5 (Intraocular Lens) ×1 IMPLANT
MARKER SKIN DUAL TIP RULER LAB (MISCELLANEOUS) ×2 IMPLANT
PACK CATARACT (MISCELLANEOUS) ×2 IMPLANT
PACK DR. KING ARMS (PACKS) ×2 IMPLANT
PACK EYE AFTER SURG (MISCELLANEOUS) ×2 IMPLANT
STENT 'ISTENT OPTH 1X.33 ADLT (Permanent Stent) IMPLANT
SYR 3ML LL SCALE MARK (SYRINGE) ×2 IMPLANT
SYR TB 1ML LUER SLIP (SYRINGE) ×2 IMPLANT
WATER STERILE IRR 500ML POUR (IV SOLUTION) ×2 IMPLANT
WIPE NON LINTING 3.25X3.25 (MISCELLANEOUS) ×2 IMPLANT

## 2016-08-22 NOTE — Anesthesia Postprocedure Evaluation (Signed)
Anesthesia Post Note  Patient: [name] [name]  Procedure(s) Performed: Procedure(s) (LRB): CATARACT EXTRACTION PHACO AND INTRAOCULAR LENS PLACEMENT (IOC) Left (Left) istent (Left)  Patient location during evaluation: PACU Anesthesia Type: MAC Level of consciousness: awake and alert and oriented Pain management: satisfactory to patient Vital Signs Assessment: post-procedure vital signs reviewed and stable Respiratory status: spontaneous breathing, nonlabored ventilation and respiratory function stable Cardiovascular status: blood pressure returned to baseline and stable Postop Assessment: Adequate PO intake and No signs of nausea or vomiting Anesthetic complications: no    Raliegh Ip

## 2016-08-22 NOTE — Transfer of Care (Signed)
Immediate Anesthesia Transfer of Care Note  Patient: [name] [name]  Procedure(s) Performed: Procedure(s): CATARACT EXTRACTION PHACO AND INTRAOCULAR LENS PLACEMENT (IOC) Left (Left) istent (Left)  Patient Location: PACU  Anesthesia Type: MAC  Level of Consciousness: awake, alert  and patient cooperative  Airway and Oxygen Therapy: Patient Spontanous Breathing and Patient connected to supplemental oxygen  Post-op Assessment: Post-op Vital signs reviewed, Patient's Cardiovascular Status Stable, Respiratory Function Stable, Patent Airway and No signs of Nausea or vomiting  Post-op Vital Signs: Reviewed and stable  Complications: No apparent anesthesia complications

## 2016-08-22 NOTE — H&P (Signed)
The History and Physical notes are on paper, have been signed, and are to be scanned.   I have examined the patient and there are no changes to the H&P.   [name] [name] 08/22/2016 8:07 AM

## 2016-08-22 NOTE — Anesthesia Procedure Notes (Signed)
Procedure Name: MAC Date/Time: 08/22/2016 8:24 AM Performed by: Janna Arch Pre-anesthesia Checklist: Patient identified, Emergency Drugs available, Suction available and Patient being monitored Patient Re-evaluated:Patient Re-evaluated prior to induction Oxygen Delivery Method: Nasal cannula

## 2016-08-22 NOTE — Op Note (Signed)
OPERATIVE NOTE  Biff Rutigliano [id number] 08/22/2016  PREOPERATIVE DIAGNOSIS:   1.  Moderate open angle glaucoma, left eye. W09.8119 2.  Nuclear sclerotic cataract left eye.  H25.12   POSTOPERATIVE DIAGNOSIS:    same.   PROCEDURE:   1.  Placement of trabecular bypass stent (istent). CPT 0191T 2.  Phacoemusification with posterior chamber intraocular lens placement of the left eye  CPT [phone]   LENS:  Implant Name Type Inv. Item Serial No. Manufacturer Lot No. LRB No. Used  LENS IOL DIOP 17.5 - [id number] Intraocular Lens LENS IOL DIOP 17.5 [id number]  Left 1  ISTENT OPTHAL 1X.33 ADULT LF - [id number] Permanent Stent ISTENT OPTHAL 1X.33 ADULT LF [id number] [employer] [id number] Left 1  LENS IOL DIOP 17.5 - [id number] Intraocular Lens LENS IOL DIOP 17.5 [id number] AMO   Left 1       PCB00 +17.5 SN [id number] lens was used.  The other lens listed above malfunctioned and was not used.  ULTRASOUND TIME: 0 minutes 26.7 seconds. CDE 4.08   SURGEON:  Benay Pillow, MD, MPH  ANESTHESIOLOGIST: Anesthesiologist: Gunnar Fusi, MD CRNA: Nelda Marseille, CRNA; Allean Found, CRNA   ANESTHESIA:  MAC with intracameral lidocaine   ESTIMATED BLOOD LOSS: less than 1 mL.   COMPLICATIONS:  None.   DESCRIPTION OF PROCEDURE:  The patient was identified in the holding room and transported to the operating room. The patient was placed in the supine position under the operating microscope.  The left eye was prepped and draped in the usual sterile ophthalmic fashion.   A 1.0 millimeter clear-corneal paracentesis was made at the 5:00 position. 0.5 ml of preservative-free 1% lidocaine with epinephrine was injected into the anterior chamber.  The anterior chamber was filled with Healon 5 viscoelastic.  A 2.4 millimeter keratome was used to make a near-clear corneal incision at the 2:30 position.   Attention was turned to the istent.  The patients head was turned and the  microscope was tilted to 035 degrees.  Ocular instruments/[employer] OAL/H2 gonioprism was used with IPC05 (iclip) coupled with Healon 5 on the cornea was used to visualize the trabecular meshwork. The istent was opened and introduced into the eye.  The meshwork was engaged with the tip of the iStent.  At this point the patient awoke and there was head movement.  The instruments were removed from the eye and the patient was instructed to hold still and he agreed.  Again the istent was introduced to the eye.  A small amount of hemorrhage was noted at the site of previous attempted insertion, but otherwise the anatomic structures were intact.  The meshwork engaged and then using the inserter, the iStent slid down Schlemm's canal until the stent was well seated.  The stent was then released from the inserter.  The iStent was inspected and in good position.  Next, attention was turned to the phacoemulsification A curvilinear capsulorrhexis was made with a cystotome and capsulorrhexis forceps.  Balanced salt solution was used to hydrodissect and hydrodelineate the nucleus.   Phacoemulsification was then used in stop and chop fashion to remove the lens nucleus and epinucleus.  The remaining cortex was then removed using the irrigation and aspiration handpiece. Healon was then placed into the capsular bag to distend it for lens placement.  A lens was then injected into the capsular bag.  The remaining viscoelastic was aspirated.   Wounds were hydrated with balanced salt solution.  The anterior chamber was inflated to a physiologic  pressure with balanced salt solution.   Intracameral vigamox 0.1 mL undiluted was injected into the eye and a drop placed onto the ocular surface.  No wound leaks were noted. Protective sunglasses were placed on the patient's eye.  The patient was taken to the recovery room in stable condition without complications of anesthesia or surgery   Benay Pillow 08/22/2016, 9:18 AM

## 2016-08-22 NOTE — Anesthesia Preprocedure Evaluation (Signed)
Anesthesia Evaluation  Patient identified by MRN, date of birth, ID band Patient awake    Reviewed: Allergy & Precautions, H&P , NPO status , Patient's Chart, lab work & pertinent test results  Airway Mallampati: III  TM Distance: >3 FB Neck ROM: full    Dental  (+) Poor Dentition, Missing, Chipped, Loose   Pulmonary Current Smoker,    Pulmonary exam normal breath sounds clear to auscultation       Cardiovascular Normal cardiovascular exam Rhythm:regular Rate:Normal     Neuro/Psych    GI/Hepatic   Endo/Other    Renal/GU      Musculoskeletal   Abdominal   Peds  Hematology   Anesthesia Other Findings   Reproductive/Obstetrics                             Anesthesia Physical Anesthesia Plan  ASA: II  Anesthesia Plan: MAC   Post-op Pain Management:    Induction:   PONV Risk Score and Plan: 1 and Midazolam  Airway Management Planned:   Additional Equipment:   Intra-op Plan:   Post-operative Plan:   Informed Consent: I have reviewed the patients History and Physical, chart, labs and discussed the procedure including the risks, benefits and alternatives for the proposed anesthesia with the patient or authorized representative who has indicated his/her understanding and acceptance.   Dental Advisory Given  Plan Discussed with: CRNA  Anesthesia Plan Comments:         Anesthesia Quick Evaluation

## 2016-08-23 ENCOUNTER — Encounter: Payer: Self-pay | Admitting: Ophthalmology

## 2016-09-12 DIAGNOSIS — M791 Myalgia: Secondary | ICD-10-CM | POA: Diagnosis not present

## 2016-09-12 DIAGNOSIS — M5416 Radiculopathy, lumbar region: Secondary | ICD-10-CM | POA: Diagnosis not present

## 2016-09-12 DIAGNOSIS — M47817 Spondylosis without myelopathy or radiculopathy, lumbosacral region: Secondary | ICD-10-CM | POA: Diagnosis not present

## 2016-09-12 DIAGNOSIS — M533 Sacrococcygeal disorders, not elsewhere classified: Secondary | ICD-10-CM | POA: Diagnosis not present

## 2016-10-12 DIAGNOSIS — H2511 Age-related nuclear cataract, right eye: Secondary | ICD-10-CM | POA: Diagnosis not present

## 2016-10-17 DIAGNOSIS — M5416 Radiculopathy, lumbar region: Secondary | ICD-10-CM | POA: Diagnosis not present

## 2016-10-17 DIAGNOSIS — Z79891 Long term (current) use of opiate analgesic: Secondary | ICD-10-CM | POA: Diagnosis not present

## 2016-10-17 DIAGNOSIS — G894 Chronic pain syndrome: Secondary | ICD-10-CM | POA: Diagnosis not present

## 2016-10-17 DIAGNOSIS — M47817 Spondylosis without myelopathy or radiculopathy, lumbosacral region: Secondary | ICD-10-CM | POA: Diagnosis not present

## 2016-10-18 ENCOUNTER — Encounter: Payer: Self-pay | Admitting: *Deleted

## 2016-10-20 NOTE — Discharge Instructions (Signed)

## 2016-10-24 DIAGNOSIS — H2511 Age-related nuclear cataract, right eye: Secondary | ICD-10-CM | POA: Diagnosis not present

## 2016-10-25 ENCOUNTER — Ambulatory Visit: Payer: Medicare HMO | Admitting: Anesthesiology

## 2016-10-25 ENCOUNTER — Encounter
Admission: RE | Disposition: A | Discharge status: Home / Self Care | Payer: Self-pay | Source: Ambulatory Visit | Attending: Ophthalmology

## 2016-10-25 ENCOUNTER — Ambulatory Visit
Admission: RE | Admit: 2016-10-25 | Discharge: 2016-10-25 | Disposition: A | Discharge status: Home / Self Care | Payer: Medicare HMO | Source: Ambulatory Visit | Attending: Ophthalmology | Admitting: Ophthalmology

## 2016-10-25 DIAGNOSIS — R69 Illness, unspecified: Secondary | ICD-10-CM | POA: Diagnosis not present

## 2016-10-25 DIAGNOSIS — M1990 Unspecified osteoarthritis, unspecified site: Secondary | ICD-10-CM | POA: Diagnosis not present

## 2016-10-25 DIAGNOSIS — H2511 Age-related nuclear cataract, right eye: Secondary | ICD-10-CM | POA: Insufficient documentation

## 2016-10-25 DIAGNOSIS — M5430 Sciatica, unspecified side: Secondary | ICD-10-CM | POA: Insufficient documentation

## 2016-10-25 DIAGNOSIS — Z79899 Other long term (current) drug therapy: Secondary | ICD-10-CM | POA: Diagnosis not present

## 2016-10-25 DIAGNOSIS — M4800 Spinal stenosis, site unspecified: Secondary | ICD-10-CM | POA: Insufficient documentation

## 2016-10-25 DIAGNOSIS — H401132 Primary open-angle glaucoma, bilateral, moderate stage: Secondary | ICD-10-CM | POA: Diagnosis not present

## 2016-10-25 DIAGNOSIS — Z87442 Personal history of urinary calculi: Secondary | ICD-10-CM | POA: Insufficient documentation

## 2016-10-25 DIAGNOSIS — I499 Cardiac arrhythmia, unspecified: Secondary | ICD-10-CM | POA: Insufficient documentation

## 2016-10-25 DIAGNOSIS — M549 Dorsalgia, unspecified: Secondary | ICD-10-CM | POA: Diagnosis not present

## 2016-10-25 DIAGNOSIS — H4010X2 Unspecified open-angle glaucoma, moderate stage: Secondary | ICD-10-CM | POA: Diagnosis not present

## 2016-10-25 DIAGNOSIS — M25569 Pain in unspecified knee: Secondary | ICD-10-CM | POA: Diagnosis not present

## 2016-10-25 DIAGNOSIS — Z9849 Cataract extraction status, unspecified eye: Secondary | ICD-10-CM | POA: Insufficient documentation

## 2016-10-25 DIAGNOSIS — Z539 Procedure and treatment not carried out, unspecified reason: Secondary | ICD-10-CM | POA: Insufficient documentation

## 2016-10-25 DIAGNOSIS — F17200 Nicotine dependence, unspecified, uncomplicated: Secondary | ICD-10-CM | POA: Insufficient documentation

## 2016-10-25 HISTORY — PX: CATARACT EXTRACTION W/PHACO: SHX586

## 2016-10-25 HISTORY — DX: Spinal stenosis, lumbar region with neurogenic claudication: M48.062

## 2016-10-25 SURGERY — PHACOEMULSIFICATION, CATARACT, WITH IOL INSERTION
Anesthesia: Monitor Anesthesia Care | Laterality: Right | Wound class: Clean

## 2016-10-25 MED ORDER — LACTATED RINGERS IV SOLN: 10.0000 mL/h | INTRAVENOUS | Status: DC

## 2016-10-25 MED ORDER — ONDANSETRON HCL 4 MG/2ML IJ SOLN: 4.0000 mg | Freq: Once | INTRAMUSCULAR | Status: DC | PRN

## 2016-10-25 MED ORDER — SODIUM HYALURONATE 23 MG/ML IO SOLN
INTRAOCULAR | Status: DC | PRN
  Administered 2016-10-25: 0.6 mL via INTRAOCULAR

## 2016-10-25 MED ORDER — ACETAMINOPHEN 160 MG/5ML PO SOLN: 325.0000 mg | ORAL | Status: DC | PRN

## 2016-10-25 MED ORDER — LIDOCAINE HCL (CARDIAC) 20 MG/ML IV SOLN
INTRAVENOUS | Status: DC | PRN
  Administered 2016-10-25: 40 mg via INTRAVENOUS

## 2016-10-25 MED ORDER — ARMC OPHTHALMIC DILATING DROPS
1.0000 "application " | OPHTHALMIC | Status: DC | PRN
  Administered 2016-10-25 (×3): 1 via OPHTHALMIC

## 2016-10-25 MED ORDER — LIDOCAINE-EPINEPHRINE 2 %-1:100000 IJ SOLN
INTRAMUSCULAR | Status: DC | PRN
  Administered 2016-10-25: 4 mL via OPHTHALMIC

## 2016-10-25 MED ORDER — NEOMYCIN-POLYMYXIN-DEXAMETH 3.5-10000-0.1 OP OINT
TOPICAL_OINTMENT | OPHTHALMIC | Status: DC | PRN
  Administered 2016-10-25: 1 via OPHTHALMIC

## 2016-10-25 MED ORDER — MOXIFLOXACIN HCL 0.5 % OP SOLN
OPHTHALMIC | Status: DC | PRN
  Administered 2016-10-25: 0.2 mL via OPHTHALMIC

## 2016-10-25 MED ORDER — EPINEPHRINE PF 1 MG/ML IJ SOLN
INTRAMUSCULAR | Status: DC | PRN
  Administered 2016-10-25: 121 mL via OPHTHALMIC

## 2016-10-25 MED ORDER — LIDOCAINE HCL (PF) 2 % IJ SOLN
INTRAOCULAR | Status: DC | PRN
  Administered 2016-10-25: 1 mL via INTRAOCULAR

## 2016-10-25 MED ORDER — PROPOFOL 10 MG/ML IV BOLUS
INTRAVENOUS | Status: DC | PRN
  Administered 2016-10-25: 30 mg via INTRAVENOUS

## 2016-10-25 MED ORDER — SODIUM HYALURONATE 10 MG/ML IO SOLN
INTRAOCULAR | Status: DC | PRN
  Administered 2016-10-25: 0.55 mL via INTRAOCULAR

## 2016-10-25 MED ORDER — ACETAMINOPHEN 325 MG PO TABS: 325.0000 mg | ORAL_TABLET | ORAL | Status: DC | PRN

## 2016-10-25 MED ORDER — MIDAZOLAM HCL 2 MG/2ML IJ SOLN
INTRAMUSCULAR | Status: DC | PRN
  Administered 2016-10-25: 2 mg via INTRAVENOUS

## 2016-10-25 MED ORDER — LIDOCAINE HCL (PF) 2 % IJ SOLN: INTRAOCULAR | Status: DC | PRN

## 2016-10-25 MED ORDER — FENTANYL CITRATE (PF) 100 MCG/2ML IJ SOLN
INTRAMUSCULAR | Status: DC | PRN
  Administered 2016-10-25 (×2): 50 ug via INTRAVENOUS

## 2016-10-25 SURGICAL SUPPLY — 22 items
CANNULA ANT/CHMB 27G (MISCELLANEOUS) ×1 IMPLANT
CANNULA ANT/CHMB 27GA (MISCELLANEOUS) ×2 IMPLANT
DISSECTOR HYDRO NUCLEUS 50X22 (MISCELLANEOUS) ×2 IMPLANT
GLOVE BIO SURGEON STRL SZ8 (GLOVE) ×2 IMPLANT
GLOVE SURG LX 7.5 STRW (GLOVE) ×1
GLOVE SURG LX STRL 7.5 STRW (GLOVE) ×1 IMPLANT
GOWN STRL REUS W/ TWL LRG LVL3 (GOWN DISPOSABLE) ×2 IMPLANT
GOWN STRL REUS W/TWL LRG LVL3 (GOWN DISPOSABLE) ×4
ICLIP (OPHTHALMIC RELATED) ×1 IMPLANT
ISTENT OPTHAL 1X.33 ADULT LF (Permanent Stent) ×2 IMPLANT
LENS IOL TECNIS ITEC 18.5 (Intraocular Lens) ×1 IMPLANT
MARKER SKIN DUAL TIP RULER LAB (MISCELLANEOUS) ×2 IMPLANT
NDL RETROBULBAR .5 NSTRL (NEEDLE) ×1 IMPLANT
PACK CATARACT (MISCELLANEOUS) ×2 IMPLANT
PACK DR. KING ARMS (PACKS) ×2 IMPLANT
PACK EYE AFTER SURG (MISCELLANEOUS) ×2 IMPLANT
STENT 'ISTENT OPTH 1X.33 ADLT (Permanent Stent) IMPLANT
SYR 3ML LL SCALE MARK (SYRINGE) ×2 IMPLANT
SYR TB 1ML LUER SLIP (SYRINGE) ×2 IMPLANT
SYRINGE 10CC LL (SYRINGE) ×1 IMPLANT
WATER STERILE IRR 500ML POUR (IV SOLUTION) ×2 IMPLANT
WIPE NON LINTING 3.25X3.25 (MISCELLANEOUS) ×2 IMPLANT

## 2016-10-25 NOTE — Anesthesia Preprocedure Evaluation (Addendum)
Anesthesia Evaluation  Patient identified by MRN, date of birth, ID band Patient awake    Reviewed: Allergy & Precautions, NPO status   Airway Mallampati: III       Dental  (+) Poor Dentition   Pulmonary neg recent URI, Current Smoker,    breath sounds clear to auscultation       Cardiovascular negative cardio ROS   Rhythm:Regular Rate:Normal     Neuro/Psych Spinal stenosis  Neuromuscular disease (chronic sciatica)    GI/Hepatic negative GI ROS,   Endo/Other  negative endocrine ROS  Renal/GU Kidney stones     Musculoskeletal  (+) Arthritis ,   Abdominal   Peds  Hematology   Anesthesia Other Findings   Reproductive/Obstetrics                            Anesthesia Physical Anesthesia Plan  ASA: II  Anesthesia Plan: MAC   Post-op Pain Management:    Induction: Intravenous  PONV Risk Score and Plan:   Airway Management Planned: Nasal Cannula  Additional Equipment:   Intra-op Plan:   Post-operative Plan:   Informed Consent: I have reviewed the patients History and Physical, chart, labs and discussed the procedure including the risks, benefits and alternatives for the proposed anesthesia with the patient or authorized representative who has indicated his/her understanding and acceptance.     Plan Discussed with: CRNA  Anesthesia Plan Comments:         Anesthesia Quick Evaluation

## 2016-10-25 NOTE — Op Note (Signed)
OPERATIVE NOTE  [name] [name] [id number] 10/25/2016  PREOPERATIVE DIAGNOSIS:   1.  Moderate open angle glaucoma, right eye. W10.2725 2.  Nuclear sclerotic cataract right eye.  H25.11   POSTOPERATIVE DIAGNOSIS:    same.   PROCEDURE:   1. Phacoemusification with posterior chamber intraocular lens placement of the right eye  CPT [phone]  Attempted, but aborted placement of trabecular bypass stent (istent). CPT 0191T   LENS:  Implant Name Type Inv. Item Serial No. Manufacturer Lot No. LRB No. Used  LENS IOL DIOP 18.5 - [id number] Intraocular Lens LENS IOL DIOP 18.5 [id number] AMO  Right 1  ISTENT OPTHAL 1X.33 ADULT LF - [id number] Permanent Stent ISTENT OPTHAL 1X.33 ADULT LF [id number] [employer] [id number] Right 1       NOTE: the istent was not implanted into the patient's eye due to intraoperative complications.   ULTRASOUND TIME: 0 minutes 44 seconds.  CDE 5.57   SURGEON:  Benay Pillow, MD, MPH  ANESTHESIOLOGIST: Anesthesiologist: Gunnar Fusi, MD CRNA: Nelda Marseille, CRNA; Allean Found, CRNA   ANESTHESIA:  MAC with intracameral  preservative-free intracameral lidocaine 4% initially.  At the conclusion of the surgery, a retrobulbar block of 50/50% mix of 0.75% bupivicaine, and 4% preservative free lidocaine was placed for patient comfort. ESTIMATED BLOOD LOSS: less than 1 mL.   COMPLICATIONS:  Iridodialysis of the right eye, nasally 3 clock hours.7:30-10:30; possible limited cyclodialysis.   DESCRIPTION OF PROCEDURE:  The patient was identified in the holding room and transported to the operating room.   The patient was placed in the supine position under the operating microscope.   The right eye was prepped and draped in the usual sterile ophthalmic fashion.   A 1.0 millimeter clear-corneal paracentesis was made at the 10:30 position. 0.5 ml of preservative-free 1% lidocaine with epinephrine was injected into the anterior chamber.  The anterior  chamber was filled with Healon 5 viscoelastic.  A 2.4 millimeter keratome was used to make a near-clear corneal incision at the 8:00 position.   Attention was turned to the istent.  The patients head was turned to the left and the microscope was tilted to 035 degrees.  Ocular instruments/Gl[employer] OAL/H2 gonioprism was used with IPC05 (iclip) coupled with Healon 5 on the cornea was used to visualize the trabecular meshwork.  There were notable peripheral synechiae, but there was some accessible TM. The istent was opened and introduced into the eye.  The meshwork was engaged with the tip of the iStent and then using the inserter, the iStent slid towards Schlemm's canal; at this time the sleeping patient awake and made a sudden head movement.  The patient was calmed and repositioned.  Visualization of the angle at this time revealed 3 clock hours of iridodialysis and a small hyphema.  The decision was made to abandon the istent procedure.  Next, attention was turned to the phacoemulsification A curvilinear capsulorrhexis was made with a cystotome and capsulorrhexis forceps.  Balanced salt solution was used to hydrodissect and hydrodelineate the nucleus.   Phacoemulsification was then used in stop and chop fashion to remove the lens nucleus and epinucleus.  The remaining cortex was then removed using the irrigation and aspiration handpiece. Healon was then placed into the capsular bag to distend it for lens placement.  A lens was then injected into the capsular bag.  The remaining viscoelastic was aspirated.   The patient, who has some chronic pain, was moaning and complaining of some eye ache during the later portion of  the procedure.  Wounds were hydrated with balanced salt solution.  The anterior chamber was inflated to a physiologic pressure with balanced salt solution.   Intracameral vigamox 0.1 mL undiluted was injected into the eye and a drop placed onto the ocular surface.  No wound leaks were  noted.   A small bolus of IV propofol was administered and a retrobulbar block was placed in the standard fashion.  Maxitrol ointment, a patch and shield were placed.  The patient was taken to the recovery room in stable condition without complications of anesthesia.  He is to leave the shield in place and we will follow up with him tomorrow in the [place] clinic.    Benay Pillow 10/25/2016, 9:07 AM

## 2016-10-25 NOTE — Anesthesia Postprocedure Evaluation (Signed)
Anesthesia Post Note  Patient: [name] [name]  Procedure(s) Performed: Procedure(s) (LRB): CATARACT EXTRACTION PHACO AND INTRAOCULAR LENS PLACEMENT (IOC) WITH ISTENT RIGHT (Right)  Patient location during evaluation: PACU Anesthesia Type: MAC Level of consciousness: awake Pain management: pain level controlled Vital Signs Assessment: post-procedure vital signs reviewed and stable Respiratory status: respiratory function stable Cardiovascular status: stable Postop Assessment: no signs of nausea or vomiting Anesthetic complications: no    Veda Canning

## 2016-10-25 NOTE — Transfer of Care (Signed)
Immediate Anesthesia Transfer of Care Note  Patient: [name] [name]  Procedure(s) Performed: Procedure(s) with comments: CATARACT EXTRACTION PHACO AND INTRAOCULAR LENS PLACEMENT (IOC) WITH ISTENT RIGHT (Right) - TOPICAL RIGHT  ISTENT  Patient Location: PACU  Anesthesia Type: MAC  Level of Consciousness: awake, alert  and patient cooperative  Airway and Oxygen Therapy: Patient Spontanous Breathing and Patient connected to supplemental oxygen  Post-op Assessment: Post-op Vital signs reviewed, Patient's Cardiovascular Status Stable, Respiratory Function Stable, Patent Airway and No signs of Nausea or vomiting  Post-op Vital Signs: Reviewed and stable  Complications: No apparent anesthesia complications

## 2016-10-25 NOTE — Anesthesia Procedure Notes (Signed)
Procedure Name: MAC Performed by: Kyannah Climer Pre-anesthesia Checklist: Patient identified, Emergency Drugs available, Suction available, Timeout performed and Patient being monitored Patient Re-evaluated:Patient Re-evaluated prior to inductionOxygen Delivery Method: Nasal cannula Placement Confirmation: positive ETCO2     

## 2016-10-25 NOTE — H&P (Signed)
The History and Physical notes are on paper, have been signed, and are to be scanned.   I have examined the patient and there are no changes to the H&P.   [name] [name] 10/25/2016 8:00 AM

## 2016-11-14 DIAGNOSIS — Z79891 Long term (current) use of opiate analgesic: Secondary | ICD-10-CM | POA: Diagnosis not present

## 2016-11-14 DIAGNOSIS — M5416 Radiculopathy, lumbar region: Secondary | ICD-10-CM | POA: Diagnosis not present

## 2016-11-14 DIAGNOSIS — G894 Chronic pain syndrome: Secondary | ICD-10-CM | POA: Diagnosis not present

## 2016-11-14 DIAGNOSIS — M5030 Other cervical disc degeneration, unspecified cervical region: Secondary | ICD-10-CM | POA: Diagnosis not present

## 2016-11-14 DIAGNOSIS — M48061 Spinal stenosis, lumbar region without neurogenic claudication: Secondary | ICD-10-CM | POA: Diagnosis not present

## 2016-11-14 DIAGNOSIS — M5136 Other intervertebral disc degeneration, lumbar region: Secondary | ICD-10-CM | POA: Diagnosis not present

## 2016-11-14 DIAGNOSIS — M47817 Spondylosis without myelopathy or radiculopathy, lumbosacral region: Secondary | ICD-10-CM | POA: Diagnosis not present

## 2016-11-14 DIAGNOSIS — Z5181 Encounter for therapeutic drug level monitoring: Secondary | ICD-10-CM | POA: Diagnosis not present

## 2016-11-25 NOTE — Progress Notes (Deleted)
Subjective:   [name] [name] is a 76 y.o. male who presents for Medicare Annual/Subsequent preventive examination.  The Patient was informed that the wellness visit is to identify future health risk and educate and initiate measures that can reduce risk for increased disease through the lifespan.    Annual Wellness Assessment  Reports health as   Preventive Screening -Counseling & Management  Medicare Annual Preventive Care Visit - Subsequent Last OV 07/2016  Cataract extraction 9/25  Health Maintenance Due  Topic Date Due  . TETANUS/TDAP  07/18/1960  . COLONOSCOPY  07/19/1991  . PNA vac Low Risk Adult (1 of 2 - PCV13) 07/19/2006  . INFLUENZA VACCINE  08/31/2016   (community acquired pneumonia Sept 2017 and Jan 2018) Cataract extraction   Current tobacco use Etoh no   Describes Health as poor, fair, good or great?   VS reviewed;   Diet   BMI  Exercise Fall 03/04/2016  Dental  Stressors:   Sleep patterns:   Pain?    Cardiac Risk Factors Addressed Hyperlipidemia - did not see one Diabetes - neg   Advanced Directives  Patient Care Team: [name], [name], MD as PCP - General (Family Medicine)         Objective:    Vitals: There were no vitals taken for this visit.  There is no height or weight on file to calculate BMI.  Tobacco History  Smoking Status  . Current Every Day Smoker  . Packs/day: 0.75  . Types: Cigarettes  Smokeless Tobacco  . Never Used     Ready to quit: Not Answered Counseling given: Not Answered   Past Medical History:  Diagnosis Date  . Back pain   . Renal disorder    kidney stones  . Spinal stenosis, lumbar region, with neurogenic claudication 07/07/2014   Past Surgical History:  Procedure Laterality Date  . CATARACT EXTRACTION W/PHACO Left 08/22/2016   Procedure: CATARACT EXTRACTION PHACO AND INTRAOCULAR LENS PLACEMENT ([place]) Left;  Surgeon: [name], MD;  Location: [facility];  Service:  Ophthalmology;  Laterality: Left;  . CATARACT EXTRACTION W/PHACO Right 10/25/2016   Procedure: CATARACT EXTRACTION PHACO AND INTRAOCULAR LENS PLACEMENT ([place]) WITH ISTENT RIGHT  ;  Surgeon: [name], MD;  Location: [facility];  Service: Ophthalmology;  Laterality: Right;  TOPICAL RIGHT  ISTENT   trabecular bypass stent was implanted/explanted.  . INGUINAL HERNIA REPAIR    . KNEE SURGERY    . NECK SURGERY     Family History  Problem Relation Age of Onset  . Diabetes Mother   . Diabetes Brother   . Diabetes Sister    History  Sexual Activity  . Sexual activity: Not on file    Outpatient Encounter Prescriptions as of 11/29/2016  Medication Sig  . albuterol (PROVENTIL HFA;VENTOLIN HFA) 108 (90 Base) MCG/ACT inhaler Inhale 2 puffs into the lungs every 6 (six) hours as needed for wheezing or shortness of breath.  . Oxycodone HCl 10 MG TABS Take 10 mg by mouth daily.   No facility-administered encounter medications on file as of 11/29/2016.     Activities of Daily Living In your present state of health, do you have any difficulty performing the following activities: 10/25/2016 08/22/2016  Hearing? N N  Vision? N N  Difficulty concentrating or making decisions? N N  Walking or climbing stairs? N Y  Dressing or bathing? N N  Some recent data might be hidden    Patient Care Team: [name], Betty  G, MD as PCP - General (Family Medicine)   Assessment:    *** Exercise Activities and Dietary recommendations    Goals    None     Fall Risk Fall Risk  10/07/2015 09/23/2015 09/08/2015 08/07/2015 07/07/2015  Falls in the past year? No No No No No  Comment - - - - -  Injury with Fall? - - - - -  Risk for fall due to : - - - - -  Risk for fall due to: Comment - - - - -   Depression Screen PHQ 2/9 Scores 09/23/2015 09/08/2015 08/07/2015 07/07/2015  PHQ - 2 Score 2 4 0 -  Exception Documentation Other- indicate reason in comment box - Other- indicate reason in comment box Patient  refusal    Cognitive Function         There is no immunization history on file for this patient. Screening Tests Health Maintenance  Topic Date Due  . TETANUS/TDAP  07/18/1960  . COLONOSCOPY  07/19/1991  . PNA vac Low Risk Adult (1 of 2 - PCV13) 07/19/2006  . INFLUENZA VACCINE  08/31/2016      Plan:     I have personally reviewed and noted the following in the patient's chart:   . Medical and social history . Use of alcohol, tobacco or illicit drugs  . Current medications and supplements . Functional ability and status . Nutritional status . Physical activity . Advanced directives . List of other physicians . Hospitalizations, surgeries, and ER visits in previous 12 months . Vitals . Screenings to include cognitive, depression, and falls . Referrals and appointments  In addition, I have reviewed and discussed with patient certain preventive protocols, quality metrics, and best practice recommendations. A written personalized care plan for preventive services as well as general preventive health recommendations were provided to patient.     Wynetta Fines, RN  11/25/2016

## 2016-11-29 ENCOUNTER — Ambulatory Visit: Payer: Medicare HMO

## 2016-12-16 ENCOUNTER — Emergency Department: Payer: Medicare HMO

## 2016-12-16 ENCOUNTER — Other Ambulatory Visit: Payer: Self-pay

## 2016-12-16 ENCOUNTER — Emergency Department
Admission: EM | Admit: 2016-12-16 | Discharge: 2016-12-16 | Disposition: A | Discharge status: Home / Self Care | Payer: Medicare HMO | Attending: Emergency Medicine | Admitting: Emergency Medicine

## 2016-12-16 DIAGNOSIS — Z79891 Long term (current) use of opiate analgesic: Secondary | ICD-10-CM | POA: Insufficient documentation

## 2016-12-16 DIAGNOSIS — F17210 Nicotine dependence, cigarettes, uncomplicated: Secondary | ICD-10-CM | POA: Diagnosis not present

## 2016-12-16 DIAGNOSIS — R109 Unspecified abdominal pain: Secondary | ICD-10-CM | POA: Diagnosis not present

## 2016-12-16 DIAGNOSIS — M545 Low back pain, unspecified: Secondary | ICD-10-CM

## 2016-12-16 DIAGNOSIS — R1031 Right lower quadrant pain: Secondary | ICD-10-CM | POA: Diagnosis not present

## 2016-12-16 DIAGNOSIS — G8929 Other chronic pain: Secondary | ICD-10-CM | POA: Insufficient documentation

## 2016-12-16 DIAGNOSIS — R079 Chest pain, unspecified: Secondary | ICD-10-CM | POA: Diagnosis not present

## 2016-12-16 DIAGNOSIS — R9431 Abnormal electrocardiogram [ECG] [EKG]: Secondary | ICD-10-CM | POA: Diagnosis not present

## 2016-12-16 DIAGNOSIS — R69 Illness, unspecified: Secondary | ICD-10-CM | POA: Diagnosis not present

## 2016-12-16 DIAGNOSIS — R1032 Left lower quadrant pain: Secondary | ICD-10-CM | POA: Diagnosis not present

## 2016-12-16 DIAGNOSIS — R4182 Altered mental status, unspecified: Secondary | ICD-10-CM | POA: Diagnosis present

## 2016-12-16 LAB — ACETAMINOPHEN LEVEL: Acetaminophen (Tylenol), Serum: <10 ug/mL — ABNORMAL LOW (ref 10–30)

## 2016-12-16 LAB — CBC WITH DIFFERENTIAL/PLATELET
Basophils Absolute: 0.1 10*3/uL (ref 0–0.1)
Basophils Relative: 1 %
Eosinophils Absolute: 0 10*3/uL (ref 0–0.7)
Eosinophils Relative: 0 %
HCT: 48.7 % (ref 40.0–52.0)
Hemoglobin: 15.5 g/dL (ref 13.0–18.0)
Lymphocytes Relative: 12 %
Lymphs Abs: 2 10*3/uL (ref 1.0–3.6)
MCH: 25.6 pg — ABNORMAL LOW (ref 26.0–34.0)
MCHC: 31.9 g/dL — ABNORMAL LOW (ref 32.0–36.0)
MCV: 80.4 fL (ref 80.0–100.0)
Monocytes Absolute: 0.9 10*3/uL (ref 0.2–1.0)
Monocytes Relative: 5 %
Neutro Abs: 14.4 10*3/uL — ABNORMAL HIGH (ref 1.4–6.5)
Neutrophils Relative %: 82 %
Platelets: 185 10*3/uL (ref 150–440)
RBC: 6.06 MIL/uL — ABNORMAL HIGH (ref 4.40–5.90)
RDW: 14.7 % — ABNORMAL HIGH (ref 11.5–14.5)
WBC: 17.4 10*3/uL — ABNORMAL HIGH (ref 3.8–10.6)

## 2016-12-16 LAB — COMPREHENSIVE METABOLIC PANEL
ALT: 14 U/L — ABNORMAL LOW (ref 17–63)
AST: 25 U/L (ref 15–41)
Albumin: 4 g/dL (ref 3.5–5.0)
Alkaline Phosphatase: 98 U/L (ref 38–126)
Anion gap: 10 (ref 5–15)
BUN: 17 mg/dL (ref 6–20)
CO2: 25 mmol/L (ref 22–32)
Calcium: 9 mg/dL (ref 8.9–10.3)
Chloride: 99 mmol/L — ABNORMAL LOW (ref 101–111)
Creatinine, Ser: 1.19 mg/dL (ref 0.61–1.24)
GFR calc Af Amer: >60 mL/min (ref >60)
GFR calc non Af Amer: 58 mL/min — ABNORMAL LOW (ref >60)
Glucose, Bld: 108 mg/dL — ABNORMAL HIGH (ref 65–99)
Potassium: 4.9 mmol/L (ref 3.5–5.1)
Sodium: 134 mmol/L — ABNORMAL LOW (ref 135–145)
Total Bilirubin: 1.4 mg/dL — ABNORMAL HIGH (ref 0.3–1.2)
Total Protein: 7.4 g/dL (ref 6.5–8.1)

## 2016-12-16 LAB — URINE DRUG SCREEN, QUALITATIVE (ARMC ONLY)
Amphetamines, Ur Screen: NOT DETECTED
Barbiturates, Ur Screen: NOT DETECTED
Benzodiazepine, Ur Scrn: NOT DETECTED
Cannabinoid 50 Ng, Ur ~~LOC~~: NOT DETECTED
Cocaine Metabolite,Ur ~~LOC~~: NOT DETECTED
MDMA (Ecstasy)Ur Screen: NOT DETECTED
Methadone Scn, Ur: NOT DETECTED
Opiate, Ur Screen: NOT DETECTED
Phencyclidine (PCP) Ur S: NOT DETECTED
Tricyclic, Ur Screen: NOT DETECTED

## 2016-12-16 LAB — LACTIC ACID, PLASMA: Lactic Acid, Venous: 1.5 mmol/L (ref 0.5–1.9)

## 2016-12-16 LAB — URINALYSIS, COMPLETE (UACMP) WITH MICROSCOPIC
Bacteria, UA: NONE SEEN
Bilirubin Urine: NEGATIVE
Glucose, UA: NEGATIVE mg/dL
Hgb urine dipstick: NEGATIVE
Ketones, ur: NEGATIVE mg/dL
Leukocytes, UA: NEGATIVE
Nitrite: NEGATIVE
Protein, ur: 30 mg/dL — AB
Specific Gravity, Urine: 1.016 (ref 1.005–1.030)
pH: 8 (ref 5.0–8.0)

## 2016-12-16 LAB — SALICYLATE LEVEL: Salicylate Lvl: <7 mg/dL (ref 2.8–30.0)

## 2016-12-16 LAB — TROPONIN I: Troponin I: <0.03 ng/mL (ref <0.03)

## 2016-12-16 MED ORDER — OXYCODONE-ACETAMINOPHEN 5-325 MG PO TABS: 1.0000 | ORAL_TABLET | Freq: Four times a day (QID) | ORAL | 0 refills | Status: DC | PRN

## 2016-12-16 MED ORDER — SODIUM CHLORIDE 0.9 % IV BOLUS (SEPSIS)
500.0000 mL | Freq: Once | INTRAVENOUS | Status: AC
  Administered 2016-12-16: 500 mL via INTRAVENOUS

## 2016-12-16 MED ORDER — OXYCODONE-ACETAMINOPHEN 5-325 MG PO TABS
1.0000 | ORAL_TABLET | Freq: Once | ORAL | Status: AC
  Administered 2016-12-16: 1 via ORAL
  Filled 2016-12-16: qty 1

## 2016-12-16 NOTE — ED Provider Notes (Addendum)
[facility] Emergency Department Provider Note  ____________________________________________   I have reviewed the triage vital signs and the nursing notes.   HISTORY  Chief Complaint Flank Pain; Altered Mental Status; and Fever    HPI [name] [name] is a 76 y.o. male with a history of chronic pain among other things presents today complaining of nothing.  He states "I do not want to be here I just want to go home.".  He has no chest pain or shortness of breath no nausea no vomiting no abdominal pain.  He states he has chronic back pain but he denies any change in that.  No numbness no radiation no tingling.  Very limited history patient is very adamant that he wants to leave and does not want to be here.  According to EMS, he came in because he had pushed his life alert bracelet.  He denies doing this.  Level 5 chart caveat; no further history available due to patient status.  Past Medical History:  Diagnosis Date  . Back pain   . Renal disorder    kidney stones  . Spinal stenosis, lumbar region, with neurogenic claudication 07/07/2014    Patient Active Problem List   Diagnosis Date Noted  . Bilateral lower extremity edema 08/12/2016  . Chronic right-sided low back pain with right-sided sciatica 03/14/2016  . Chronic pain of right knee 03/14/2016  . DDD (degenerative disc disease), lumbar 07/07/2014  . DDD (degenerative disc disease), cervical 07/07/2014  . DJD (degenerative joint disease) of knee 07/07/2014  . Bilateral occipital neuralgia 07/07/2014  . Spinal stenosis, lumbar region, with neurogenic claudication 07/07/2014    Past Surgical History:  Procedure Laterality Date  . CATARACT EXTRACTION PHACO AND INTRAOCULAR LENS PLACEMENT ([place]) Left Left 08/22/2016   Performed by [name], MD at [facility]  . CATARACT EXTRACTION PHACO AND INTRAOCULAR LENS PLACEMENT ([place]) WITH ISTENT RIGHT   Right 10/25/2016   Performed by [name], MD at [place]  . INGUINAL HERNIA REPAIR    . istent Left 08/22/2016   Performed by [name], MD at [place]    . NECK SURGERY      Prior to Admission medications   Medication Sig Start Date End Date Taking? Authorizing Provider  albuterol (PROVENTIL HFA;VENTOLIN HFA) 108 (90 Base) MCG/ACT inhaler Inhale 2 puffs into the lungs every 6 (six) hours as needed for wheezing or shortness of breath. 02/11/16   Merlyn Lot, MD  Oxycodone HCl 10 MG TABS Take 10 mg by mouth daily.    [provider]    Allergies No known allergies  Family History  Problem Relation Age of Onset  . Diabetes Mother   . Diabetes Brother   . Diabetes Sister     Social History Social History   Tobacco Use  . Smoking status: Current Every Day Smoker    Packs/day: 0.75    Types: Cigarettes  . Smokeless tobacco: Never Used  Substance Use Topics  . Alcohol use: No  . Drug use: No    Review of Systems Constitutional: No fever/chills Eyes: No visual changes. ENT: No sore throat. No stiff neck no neck pain Cardiovascular: Denies chest pain. Respiratory: Denies shortness of breath. Gastrointestinal:   no vomiting.  No diarrhea.  No constipation. Genitourinary: Negative for dysuria. Musculoskeletal: Negative lower extremity swelling Skin: Negative for rash. Neurological: Negative for severe headaches, focal weakness or numbness.   ____________________________________________  PHYSICAL EXAM:  VITAL SIGNS: ED Triage Vitals  Enc Vitals Group     BP 12/16/16 0950 (!) 165/94     Pulse Rate 12/16/16 0950 75     Resp 12/16/16 0950 18     Temp 12/16/16 0950 100 F (37.8 C)     Temp Source 12/16/16 0950 Oral     SpO2 12/16/16 0950 96 %     Weight 12/16/16 0951 155 lb (70.3 kg)     Height --      Head Circumference --      Peak Flow --      Pain Score 12/16/16 0950 7     Pain Loc --      Pain Edu? --      Excl. in [place]? --      Constitutional: Alert and oriented to name and place will not tell me the date, patient is angry and sometimes not compliant with exam. Well appearing and in no acute distress.  Does not appear to be medically toxic Eyes: Conjunctivae are normal Head: Atraumatic HEENT: No congestion/rhinnorhea. Mucous membranes are moist.  Oropharynx non-erythematous Neck:   Nontender with no meningismus, no masses, no stridor Cardiovascular: Normal rate, regular rhythm. Grossly normal heart sounds.  Good peripheral circulation. Respiratory: Normal respiratory effort.  No retractions. Lungs CTAB. Abdominal: Soft and nontender. No distention. No guarding no rebound Back:  There is no focal tenderness or step off.  there is no midline tenderness there are no lesions noted. there is no CVA tenderness Musculoskeletal: No lower extremity tenderness, no upper extremity tenderness. No joint effusions, no DVT signs strong distal pulses no edema Neurologic:  Normal speech and language. No gross focal neurologic deficits are appreciated.  Skin:  Skin is warm, dry and intact. No rash noted. Psychiatric: Mood and affect are angry and upset. Speech and behavior are normal.  ____________________________________________   LABS (all labs ordered are listed, but only abnormal results are displayed)  Labs Reviewed  CBC WITH DIFFERENTIAL/PLATELET - Abnormal; Notable for the following components:      Result Value   WBC 17.4 (*)    RBC 6.06 (*)    MCH 25.6 (*)    MCHC 31.9 (*)    RDW 14.7 (*)    Neutro Abs 14.4 (*)    All other components within normal limits  ACETAMINOPHEN LEVEL - Abnormal; Notable for the following components:   Acetaminophen (Tylenol), Serum <10 (*)    All other components within normal limits  COMPREHENSIVE METABOLIC PANEL - Abnormal; Notable for the following components:   Sodium 134 (*)    Chloride 99 (*)    Glucose, Bld 108 (*)    ALT 14 (*)    Total Bilirubin 1.4 (*)    GFR calc non  Af Amer 58 (*)    All other components within normal limits  URINALYSIS, COMPLETE (UACMP) WITH MICROSCOPIC - Abnormal; Notable for the following components:   Color, Urine YELLOW (*)    APPearance CLEAR (*)    Protein, ur 30 (*)    Squamous Epithelial / LPF 0-5 (*)    All other components within normal limits  URINE CULTURE  SALICYLATE LEVEL  TROPONIN I  LACTIC ACID, PLASMA  URINE DRUG SCREEN, QUALITATIVE ([facility] ONLY)    Pertinent labs  results that were available during my care of the patient were reviewed by me and considered in my medical decision making (see chart for details). ____________________________________________  EKG  I personally interpreted any EKGs  ordered by me or triage  ____________________________________________  RADIOLOGY  Pertinent labs & imaging results that were available during my care of the patient were reviewed by me and considered in my medical decision making (see chart for details). If possible, patient and/or family made aware of any abnormal findings.  Dg Chest 2 View  Result Date: 12/16/2016 CLINICAL DATA:  76 year old male with acute right-sided pain. EXAM: CHEST  2 VIEW COMPARISON:  02/11/2016 and prior chest radiographs FINDINGS: The cardiomediastinal silhouette is unremarkable. There is no evidence of focal airspace disease, pulmonary edema, suspicious pulmonary nodule/mass, pleural effusion, or pneumothorax. No acute bony abnormalities are identified. Remote left rib fractures are again noted. IMPRESSION: No active cardiopulmonary disease. Electronically Signed   By: Margarette Canada M.D.   On: 12/16/2016 10:43   Ct Renal Stone Study  Result Date: 12/16/2016 CLINICAL DATA:  Right flank pain. EXAM: CT ABDOMEN AND PELVIS WITHOUT CONTRAST TECHNIQUE: Multidetector CT imaging of the abdomen and pelvis was performed following the standard protocol without IV contrast. COMPARISON:  CT 09/15/2014 FINDINGS: Lower chest: Airspace disease noted in the right  lower lobe is stable since 2016, presumably scarring. Scarring in the left base. Small nodule in the right middle lobe is stable. No effusions. Heart is normal size. Hepatobiliary: No focal hepatic abnormality. Gallbladder unremarkable. Pancreas: No focal abnormality or ductal dilatation. Spleen: No focal abnormality.  Normal size. Adrenals/Urinary Tract: Scarring noted in the upper pole and midpole of the right kidney. Multiple hypodensities in the right kidney, likely cysts. No ureteral stones or hydronephrosis bilaterally. Previously seen right midpole stone is no longer visualized. Adrenal glands and urinary bladder unremarkable. Stomach/Bowel: Moderate stool burden. Stomach, large and small bowel grossly unremarkable. Vascular/Lymphatic: Aortic and iliac calcifications. No aneurysm or adenopathy. Reproductive: Mildly prominent prostate. Other: No free fluid or free air. Musculoskeletal: Diffuse degenerative disc and facet disease. No acute bony abnormality. IMPRESSION: No renal or ureteral stones.  No hydronephrosis. Moderate stool burden. Stable airspace density at the right lung base, likely scarring. Stable right middle lobe nodule. Aortoiliac atherosclerosis. Mildly prominent prostate. Electronically Signed   By: Rolm Baptise M.D.   On: 12/16/2016 10:29   ____________________________________________    PROCEDURES  Procedure(s) performed: None  Procedures  Critical Care performed: None  ____________________________________________   INITIAL IMPRESSION / ASSESSMENT AND PLAN / ED COURSE  Pertinent labs & imaging results that were available during my care of the patient were reviewed by me and considered in my medical decision making (see chart for details).  Patient here, angry and upset unclear what baseline I try to call family members but numbers are disconnected we try to call them on his phone and those numbers do not seem to work either.  Patient is angry and would like to go home  but we do not really have a way of safely getting him anywhere at this time without talking to family.  Does not appear to be altered he may suffer from some degree of dementia, I try to do a CT scan but he is refusing at this time.  He is afebrile technically, and he is in no acute medical distress I did a CT scan based on his history of back pain which does not show any acute pathology, blood work is reassuring, white count is up but that is indeterminate, urinalysis is reassuring, ----------------------------------------- 1:09 PM on 12/16/2016 -----------------------------------------    We were finally able to get family on the phone they state that this is near his  baseline to the extent that they can tell "he has a temper".  Patient is in no acute distress here we are watching him closely as he is adamant that he will march out the door but I want to make sure he is at his baseline family is hopefully coming in to further assess him.  ----------------------------------------- 3:03 PM on 12/16/2016 -----------------------------------------  Patient in no acute distress family, sister [name], at bedside.  She says "he gets like this sometimes when he is angry".  Patient states that he just wants pain pill for his back which she has run out of, and some food and he would like to go home.  No evidence of acute pathology noted on extensive workup.  His sister is determining whether she feels that he is at his baseline and if so, I does my hope we can can safely get him home.  Signed out to Dr. Rip Harbour at the end of my shift.  Time spent.b ----------------------------------------- 3:14 PM on 12/16/2016 -----------------------------------------  Family adamant that the patient is normal at his baseline this is his normal self and they like to go home.  White count is somewhat elevated but this is not unusual for him.  No evidence of ongoing infection or meningitis, no abdominal pain or tenderness  back pain is none radicular with no evidence of cauda equina syndrome, there is nothing to suggest ACS PE dissection or head injury, we will discharge the patient in the care of his family.     ____________________________________________   FINAL CLINICAL IMPRESSION(S) / ED DIAGNOSES  Final diagnoses:  None      This chart was dictated using voice recognition software.  Despite best efforts to proofread,  errors can occur which can change meaning.  ]   Schuyler Amor, MD 12/16/16 1257    Schuyler Amor, MD 12/16/16 1310    Schuyler Amor, MD 12/16/16 1346    Schuyler Amor, MD 12/16/16 1505    Schuyler Amor, MD 12/16/16 1511    Schuyler Amor, MD 12/16/16 1515

## 2016-12-16 NOTE — ED Triage Notes (Signed)
Pt arrives via ems from home after using his life alert button, pt c/o rt flank pain, ems reports seeing a heating pad near pt and ointments pt has been using as well as a bottle of oxycodone that was filled 10/22. Pt appears confused and is unable to give accurate information at this time

## 2016-12-16 NOTE — ED Notes (Signed)
Sitting with pt per order and safety; pt does occasionally fall asleep then wakes up very agitated demanding to see his sister and wanting to go home.

## 2016-12-17 LAB — URINE CULTURE: Culture: <10000 — AB

## 2016-12-20 DIAGNOSIS — M5136 Other intervertebral disc degeneration, lumbar region: Secondary | ICD-10-CM | POA: Diagnosis not present

## 2016-12-20 DIAGNOSIS — M5030 Other cervical disc degeneration, unspecified cervical region: Secondary | ICD-10-CM | POA: Diagnosis not present

## 2016-12-20 DIAGNOSIS — Z5181 Encounter for therapeutic drug level monitoring: Secondary | ICD-10-CM | POA: Diagnosis not present

## 2016-12-20 DIAGNOSIS — M48061 Spinal stenosis, lumbar region without neurogenic claudication: Secondary | ICD-10-CM | POA: Diagnosis not present

## 2016-12-26 DIAGNOSIS — M5416 Radiculopathy, lumbar region: Secondary | ICD-10-CM | POA: Diagnosis not present

## 2016-12-26 DIAGNOSIS — M4802 Spinal stenosis, cervical region: Secondary | ICD-10-CM | POA: Diagnosis not present

## 2017-01-11 DIAGNOSIS — M5416 Radiculopathy, lumbar region: Secondary | ICD-10-CM | POA: Diagnosis not present

## 2017-01-12 NOTE — Progress Notes (Deleted)
      HPI:   [name] is a 76 y.o. male, who is here today to follow on some chronic medical problems. He was last seen on 08/12/16.  Since his last OV he has has cataract surgery and was in the ER on 12/16/16 for back pain. Severe throbbing LE pain, worse at night, interfering with sleep. He is on Oxycodone.  Hx of spinal stenosis with neurogenic claudication and venous disease with LE edema. He follows with pain management,Dr Primus Bravo.  He has not tolerated Gabapentin, Furosemide. He does not wear compression stocking as recommended.  Concerns today: ***     Review of Systems    Current Outpatient Medications on File Prior to Visit  Medication Sig Dispense Refill  . albuterol (PROVENTIL HFA;VENTOLIN HFA) 108 (90 Base) MCG/ACT inhaler Inhale 2 puffs into the lungs every 6 (six) hours as needed for wheezing or shortness of breath. 1 Inhaler 2  . Oxycodone HCl 10 MG TABS Take 10 mg by mouth daily.    [name] oxyCODONE-acetaminophen (ROXICET) 5-325 MG tablet Take 1 tablet every 6 (six) hours as needed by mouth. 6 tablet 0   No current facility-administered medications on file prior to visit.      Past Medical History:  Diagnosis Date  . Back pain   . Renal disorder    kidney stones  . Spinal stenosis, lumbar region, with neurogenic claudication 07/07/2014   Allergies  Allergen Reactions  . No Known Allergies     Social History   Socioeconomic History  . Marital status: Widowed    Spouse name: Not on file  . Number of children: Not on file  . Years of education: Not on file  . Highest education level: Not on file  Social Needs  . Financial resource strain: Not on file  . Food insecurity - worry: Not on file  . Food insecurity - inability: Not on file  . Transportation needs - medical: Not on file  . Transportation needs - non-medical: Not on file  Occupational History  . Not on file  Tobacco Use  . Smoking status: Current Every Day Smoker   Packs/day: 0.75    Types: Cigarettes  . Smokeless tobacco: Never Used  Substance and Sexual Activity  . Alcohol use: No  . Drug use: No  . Sexual activity: Not on file  Other Topics Concern  . Not on file  Social History Narrative  . Not on file    There were no vitals filed for this visit. There is no height or weight on file to calculate BMI.      Physical Exam    ASSESSMENT AND PLAN:     Diagnoses and all orders for this visit:  Spinal stenosis, lumbar region, with neurogenic claudication  Bilateral lower extremity edema           -[name] was advised to return sooner than planned today if new concerns arise.       [name], MD  [facility]. [place] office.

## 2017-01-13 ENCOUNTER — Ambulatory Visit: Payer: Medicare HMO | Admitting: Family Medicine

## 2017-01-27 DIAGNOSIS — M5136 Other intervertebral disc degeneration, lumbar region: Secondary | ICD-10-CM | POA: Diagnosis not present

## 2017-01-27 DIAGNOSIS — M5030 Other cervical disc degeneration, unspecified cervical region: Secondary | ICD-10-CM | POA: Diagnosis not present

## 2017-01-27 DIAGNOSIS — M48061 Spinal stenosis, lumbar region without neurogenic claudication: Secondary | ICD-10-CM | POA: Diagnosis not present

## 2017-01-27 DIAGNOSIS — Z5181 Encounter for therapeutic drug level monitoring: Secondary | ICD-10-CM | POA: Diagnosis not present

## 2017-02-14 ENCOUNTER — Ambulatory Visit (INDEPENDENT_AMBULATORY_CARE_PROVIDER_SITE_OTHER): Payer: Medicare HMO | Admitting: Family Medicine

## 2017-02-14 ENCOUNTER — Encounter: Payer: Self-pay | Admitting: Family Medicine

## 2017-02-14 VITALS — BP 128/84 | HR 72 | Temp 98.0°F | Resp 16 | Ht 68.0 in | Wt 157.5 lb

## 2017-02-14 DIAGNOSIS — R69 Illness, unspecified: Secondary | ICD-10-CM | POA: Diagnosis not present

## 2017-02-14 DIAGNOSIS — M48062 Spinal stenosis, lumbar region with neurogenic claudication: Secondary | ICD-10-CM

## 2017-02-14 DIAGNOSIS — M159 Polyosteoarthritis, unspecified: Secondary | ICD-10-CM

## 2017-02-14 DIAGNOSIS — R600 Localized edema: Secondary | ICD-10-CM

## 2017-02-14 DIAGNOSIS — F17200 Nicotine dependence, unspecified, uncomplicated: Secondary | ICD-10-CM

## 2017-02-14 MED ORDER — NICOTINE 21 MG/24HR TD PT24: 21.0000 mg | MEDICATED_PATCH | Freq: Every day | TRANSDERMAL | 0 refills | Status: AC

## 2017-02-14 NOTE — Progress Notes (Signed)
HPI:   [name] [name] is a 77 y.o. male, who is here today for 5 months follow up.   He was last seen on 08/12/16 when he was c/o LE edema and LE throbbing pain.  Since his last OV he had cataract surgery.  LE edema has improved for the past couple months. Edema is worse at the end of the day and R>L. He has not noted skin erythema or ulcer.  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  LE pain also improved after receiving epidural injection. He states that his legs still burn and "stings" but not as bad. Symptoms are worse at night. He did not want to continue Gabapentin.  He follows with Dr Primus Bravo for pain management. He is on Oxycodone 10 mg daily as needed.   Today he is c/o knee stiffness, worse in the morning when he fists gets up and after prolonged rest. He cannot move as fast as he would like to. Alleviated by movement.    Lab Results  Component Value Date   CREATININE 1.19 12/16/2016   BUN 17 12/16/2016   NA 134 (L) 12/16/2016   K 4.9 12/16/2016   CL 99 (L) 12/16/2016   CO2 25 12/16/2016   Still smoking. He denies wheezing or dyspnea. He has occasional cough.    Review of Systems  Constitutional: Negative for activity change, appetite change, fatigue and fever.  HENT: Negative for nosebleeds, sore throat and trouble swallowing.   Eyes: Negative for redness and visual disturbance.  Respiratory: Negative for shortness of breath and wheezing.   Cardiovascular: Positive for leg swelling. Negative for chest pain and palpitations.  Gastrointestinal: Negative for abdominal pain, nausea and vomiting.       No changes in bowel habits.  Genitourinary: Negative for decreased urine volume and hematuria.  Musculoskeletal: Positive for arthralgias, back pain and gait problem.  Skin: Negative for rash and wound.  Neurological: Negative for syncope, weakness and headaches.  Psychiatric/Behavioral:  Negative for confusion. The patient is nervous/anxious.     Current Outpatient Medications on File Prior to Visit  Medication Sig Dispense Refill  . Oxycodone HCl 10 MG TABS Take 10 mg by mouth daily.     No current facility-administered medications on file prior to visit.      Past Medical History:  Diagnosis Date  . Back pain   . Renal disorder    kidney stones  . Spinal stenosis, lumbar region, with neurogenic claudication 07/07/2014   Allergies  Allergen Reactions  . No Known Allergies     Social History   Socioeconomic History  . Marital status: Widowed    Spouse name: None  . Number of children: None  . Years of education: None  . Highest education level: None  Social Needs  . Financial resource strain: None  . Food insecurity - worry: None  . Food insecurity - inability: None  . Transportation needs - medical: None  . Transportation needs - non-medical: None  Occupational History  . None  Tobacco Use  . Smoking status: Current Every Day Smoker    Packs/day: 0.75    Types: Cigarettes  . Smokeless tobacco: Never Used  Substance and Sexual Activity  . Alcohol use: No  . Drug use: No  . Sexual activity: None  Other Topics Concern  . None  Social History Narrative  . None    Vitals:   02/14/17 1038  BP: 128/84  Pulse: 72  Resp: 16  Temp: 98 F (36.7 C)  SpO2: 96%   Body mass index is 23.95 kg/m.   Physical Exam  Nursing note and vitals reviewed. Constitutional: He is oriented to person, place, and time. He appears well-developed and well-nourished. No distress.  HENT:  Head: Normocephalic and atraumatic.  Mouth/Throat: Oropharynx is clear and moist and mucous membranes are normal.  Eyes: Conjunctivae are normal. Pupils are equal, round, and reactive to light.  Arcus senile bilateral.   Neck: No thyroid mass present.  Cardiovascular: Normal rate and regular rhythm.  No murmur heard. DP and PT pulses present bilateral.  Respiratory: Effort  normal and breath sounds normal. No respiratory distress.  GI: Soft. He exhibits no mass. There is no hepatomegaly. There is no tenderness.  Musculoskeletal: He exhibits edema (Nonpitting trace peri-ankle edema, bilateral).       Lumbar back: He exhibits tenderness. He exhibits no bony tenderness.  Tenderness upon palpation of lumbar paraspinal muscles ,bilateral.  Lymphadenopathy:    He has no cervical adenopathy.  Neurological: He is alert and oriented to person, place, and time. Gait abnormal.  No focal deficit appreciated. Unstable gait, no assistance today.  Skin: Skin is warm. No rash noted. No erythema.  Psychiatric: His mood appears anxious. Cognition and memory are normal.  Well groomed, good eye contact.    ASSESSMENT AND PLAN:   [name] was seen today for 5 months follow-up.   Diagnoses and all orders for this visit:  Tobacco use disorder  We discussed adverse effects of tobacco and benefits of smoking cessation. After discussion of a few pharmacologic options,she would like to try Nicotine patches. Side effects discussed. He will start with Nicotine patch 21 mg x 7 weeks then 14 mg and then 7 mg.  -     nicotine (NICODERM CQ - DOSED IN MG/24 HOURS) 21 mg/24hr patch; Place 1 patch (21 mg total) onto the skin daily.  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 LE elevation and compression stocking still recommended.  Osteoarthritis of multiple joints, unspecified osteoarthritis type  Hx of right hip and knee OA. Educated about symptoms and prognosis. Fall prevention discussed.  Spinal stenosis, lumbar region, with neurogenic claudication  Symptoms improved. Continue following with Dr Marlou Sa and Dr Primus Bravo.     -[name] was advised to return sooner than planned today if new concerns arise.       Reace Breshears G. [name], MD  [facility]. [place] office.

## 2017-02-14 NOTE — Patient Instructions (Signed)
A few things to remember from today's visit:   Spinal stenosis, lumbar region, with neurogenic claudication  Tobacco use disorder - Plan: nicotine (NICODERM CQ - DOSED IN MG/24 HOURS) 21 mg/24hr patch  Nicotine patches for 7-8 weeks then we start decreasing dose.   Please be sure medication list is accurate. If a new problem present, please set up appointment sooner than planned today.

## 2017-02-20 DIAGNOSIS — M5030 Other cervical disc degeneration, unspecified cervical region: Secondary | ICD-10-CM | POA: Diagnosis not present

## 2017-02-20 DIAGNOSIS — Z5181 Encounter for therapeutic drug level monitoring: Secondary | ICD-10-CM | POA: Diagnosis not present

## 2017-02-20 DIAGNOSIS — G629 Polyneuropathy, unspecified: Secondary | ICD-10-CM | POA: Diagnosis not present

## 2017-02-20 DIAGNOSIS — I739 Peripheral vascular disease, unspecified: Secondary | ICD-10-CM | POA: Diagnosis not present

## 2017-02-24 DIAGNOSIS — H04123 Dry eye syndrome of bilateral lacrimal glands: Secondary | ICD-10-CM | POA: Diagnosis not present

## 2017-03-24 DIAGNOSIS — D4989 Neoplasm of unspecified behavior of other specified sites: Secondary | ICD-10-CM | POA: Diagnosis not present

## 2017-03-28 DIAGNOSIS — M5030 Other cervical disc degeneration, unspecified cervical region: Secondary | ICD-10-CM | POA: Diagnosis not present

## 2017-03-28 DIAGNOSIS — G629 Polyneuropathy, unspecified: Secondary | ICD-10-CM | POA: Diagnosis not present

## 2017-03-28 DIAGNOSIS — Z5181 Encounter for therapeutic drug level monitoring: Secondary | ICD-10-CM | POA: Diagnosis not present

## 2017-03-28 DIAGNOSIS — I739 Peripheral vascular disease, unspecified: Secondary | ICD-10-CM | POA: Diagnosis not present

## 2017-04-25 DIAGNOSIS — I739 Peripheral vascular disease, unspecified: Secondary | ICD-10-CM | POA: Diagnosis not present

## 2017-04-25 DIAGNOSIS — G629 Polyneuropathy, unspecified: Secondary | ICD-10-CM | POA: Diagnosis not present

## 2017-04-25 DIAGNOSIS — Z5181 Encounter for therapeutic drug level monitoring: Secondary | ICD-10-CM | POA: Diagnosis not present

## 2017-04-25 DIAGNOSIS — M5030 Other cervical disc degeneration, unspecified cervical region: Secondary | ICD-10-CM | POA: Diagnosis not present

## 2017-05-24 DIAGNOSIS — I739 Peripheral vascular disease, unspecified: Secondary | ICD-10-CM | POA: Diagnosis not present

## 2017-05-24 DIAGNOSIS — G629 Polyneuropathy, unspecified: Secondary | ICD-10-CM | POA: Diagnosis not present

## 2017-05-24 DIAGNOSIS — Z5181 Encounter for therapeutic drug level monitoring: Secondary | ICD-10-CM | POA: Diagnosis not present

## 2017-05-24 DIAGNOSIS — M5030 Other cervical disc degeneration, unspecified cervical region: Secondary | ICD-10-CM | POA: Diagnosis not present

## 2017-06-06 NOTE — Progress Notes (Deleted)
      ACUTE VISIT   HPI:  No chief complaint on file.   [name] is a 77 y.o. male, who is here today complaining of LE edema that started last week. Hx of LE edema,intermittent, exacerbated by prolonged standing and walking. Alleviated by rest and elevation.  Compression stocking have been recommended in the past but poor tolerated.  He also did not want to take diuretics.    Review of Systems    Current Outpatient Medications on File Prior to Visit  Medication Sig Dispense Refill  . Oxycodone HCl 10 MG TABS Take 10 mg by mouth daily.     No current facility-administered medications on file prior to visit.      Past Medical History:  Diagnosis Date  . Back pain   . Renal disorder    kidney stones  . Spinal stenosis, lumbar region, with neurogenic claudication 07/07/2014   Allergies  Allergen Reactions  . No Known Allergies     Social History   Socioeconomic History  . Marital status: Widowed    Spouse name: Not on file  . Number of children: Not on file  . Years of education: Not on file  . Highest education level: Not on file  Occupational History  . Not on file  Social Needs  . Financial resource strain: Not on file  . Food insecurity:    Worry: Not on file    Inability: Not on file  . Transportation needs:    Medical: Not on file    Non-medical: Not on file  Tobacco Use  . Smoking status: Current Every Day Smoker    Packs/day: 0.75    Types: Cigarettes  . Smokeless tobacco: Never Used  Substance and Sexual Activity  . Alcohol use: No  . Drug use: No  . Sexual activity: Not on file  Lifestyle  . Physical activity:    Days per week: Not on file    Minutes per session: Not on file  . Stress: Not on file  Relationships  . Social connections:    Talks on phone: Not on file    Gets together: Not on file    Attends religious service: Not on file    Active member of club or organization: Not on file    Attends meetings of  clubs or organizations: Not on file    Relationship status: Not on file  Other Topics Concern  . Not on file  Social History Narrative  . Not on file    There were no vitals filed for this visit. There is no height or weight on file to calculate BMI.      Physical Exam    ASSESSMENT AND PLAN:     Diagnoses and all orders for this visit:  Bilateral lower extremity edema        No follow-ups on file.     -[name] was advised to seek immediate medical attention if sudden worsening symptoms or to follow if they persist or if new concerns arise.       Emaline Karnes G. [name], MD  [facility]. [place] office.

## 2017-06-07 ENCOUNTER — Ambulatory Visit: Payer: Medicare HMO | Admitting: Family Medicine

## 2017-06-07 DIAGNOSIS — Z0289 Encounter for other administrative examinations: Secondary | ICD-10-CM

## 2017-06-09 ENCOUNTER — Ambulatory Visit (INDEPENDENT_AMBULATORY_CARE_PROVIDER_SITE_OTHER): Payer: Medicare HMO | Admitting: Family Medicine

## 2017-06-09 ENCOUNTER — Encounter: Payer: Self-pay | Admitting: Family Medicine

## 2017-06-09 VITALS — BP 130/82 | HR 65 | Temp 98.2°F | Resp 16 | Ht 68.0 in | Wt 151.4 lb

## 2017-06-09 DIAGNOSIS — J309 Allergic rhinitis, unspecified: Secondary | ICD-10-CM

## 2017-06-09 DIAGNOSIS — F17200 Nicotine dependence, unspecified, uncomplicated: Secondary | ICD-10-CM

## 2017-06-09 DIAGNOSIS — M5030 Other cervical disc degeneration, unspecified cervical region: Secondary | ICD-10-CM | POA: Diagnosis not present

## 2017-06-09 DIAGNOSIS — R600 Localized edema: Secondary | ICD-10-CM

## 2017-06-09 DIAGNOSIS — R69 Illness, unspecified: Secondary | ICD-10-CM | POA: Diagnosis not present

## 2017-06-09 NOTE — Progress Notes (Signed)
ACUTE VISIT   HPI:  Chief Complaint  Patient presents with  . Edema    legs and feet for 2 weeks    [name] is a 77 y.o. male, who is here today complaining of LE edema that started 2 weeks ago.  Hx of LE edema,intermittent, exacerbated by prolonged standing and walking. Alleviated by rest and elevation.  Compression stocking have been recommended in the past but poor tolerated.  He also did not want to take diuretics.  History of spinal stenosis with neurogenic claudication, he is getting epidural lumbar injections, does not recall name of provider.He was referred by Dr Primus Bravo. He feels like injections are also helping with lower extremity edema and planning on arranging another appt.  He has not noted fever, chills, decreased urine output, gross hematuria, or forming the urine.  He follows with Dr. Primus Bravo for pain management.  He is still smoking, he is not interested in smoking cessation.  -He is also complaining of eyes and nose pruritus, dry nasal mucosa. He thinks high symptoms are caused by cataract surgery.  He has not identified exacerbating or alleviating factors. He has not try OTC medications.  -Right cervical pain radiated to right shoulder for the past 3-7 days. No recent trauma. No numbness or tingling on this area. Sharp pain 7/10. Limitation of range of motion due to pain. He has had this pain intermittently for years. He is currently on oxycodone 10 mg daily for chronic pain. He has not try OTC medications.   Review of Systems  Constitutional: Positive for fatigue. Negative for activity change, appetite change and fever.  HENT: Positive for rhinorrhea. Negative for congestion, nosebleeds, sore throat and trouble swallowing.   Eyes: Positive for discharge and itching. Negative for redness.  Respiratory: Negative for cough, shortness of breath and wheezing.   Cardiovascular: Positive for leg swelling. Negative for chest pain  and palpitations.  Gastrointestinal: Negative for abdominal pain, nausea and vomiting.  Genitourinary: Negative for decreased urine volume, dysuria and hematuria.  Musculoskeletal: Positive for arthralgias, back pain and gait problem.  Skin: Negative for rash and wound.  Allergic/Immunologic: Positive for environmental allergies.  Neurological: Negative for weakness and headaches.  Psychiatric/Behavioral: Negative for confusion. The patient is nervous/anxious.       Current Outpatient Medications on File Prior to Visit  Medication Sig Dispense Refill  . Oxycodone HCl 10 MG TABS Take 10 mg by mouth daily.     No current facility-administered medications on file prior to visit.      Past Medical History:  Diagnosis Date  . Back pain   . Renal disorder    kidney stones  . Spinal stenosis, lumbar region, with neurogenic claudication 07/07/2014   Allergies  Allergen Reactions  . No Known Allergies     Social History   Socioeconomic History  . Marital status: Widowed    Spouse name: Not on file  . Number of children: Not on file  . Years of education: Not on file  . Highest education level: Not on file  Occupational History  . Not on file  Social Needs  . Financial resource strain: Not on file  . Food insecurity:    Worry: Not on file    Inability: Not on file  . Transportation needs:    Medical: Not on file    Non-medical: Not on file  Tobacco Use  . Smoking status: Current Every Day Smoker    Packs/day: 0.75  Types: Cigarettes  . Smokeless tobacco: Never Used  Substance and Sexual Activity  . Alcohol use: No  . Drug use: No  . Sexual activity: Not on file  Lifestyle  . Physical activity:    Days per week: Not on file    Minutes per session: Not on file  . Stress: Not on file  Relationships  . Social connections:    Talks on phone: Not on file    Gets together: Not on file    Attends religious service: Not on file    Active member of club or  organization: Not on file    Attends meetings of clubs or organizations: Not on file    Relationship status: Not on file  Other Topics Concern  . Not on file  Social History Narrative  . Not on file    Vitals:   06/09/17 1434  BP: 130/82  Pulse: 65  Resp: 16  Temp: 98.2 F (36.8 C)  SpO2: 96%   Body mass index is 23.02 kg/m.   Physical Exam  Nursing note and vitals reviewed. Constitutional: He is oriented to person, place, and time. He appears well-developed and well-nourished. No distress.  HENT:  Head: Normocephalic and atraumatic.  Mouth/Throat: Oropharynx is clear and moist and mucous membranes are normal.  Crusty nasal mucus, L>R. Epiphora bilateral.   Eyes: Conjunctivae are normal.  Cardiovascular: Normal rate and regular rhythm.  No murmur heard. DP pulses present bilateral.   Respiratory: Effort normal and breath sounds normal. No respiratory distress.  GI: Soft. He exhibits no mass. There is no hepatomegaly. There is no tenderness.  Musculoskeletal: He exhibits edema (Trace pitting LE eema and 1+ periankle bilateral).  Limitation of range of motion of shoulder, movement elicits pain. Pain upon palpation of right paraspinal cervical muscles and trapezium. Pain is also elicited on his lower back upon movement on exam table. Right knee pain with movement.  Antalgic gait.  Lymphadenopathy:    He has no cervical adenopathy.  Neurological: He is alert and oriented to person, place, and time. He has normal strength.  Mildly unstable gait without assistance.  Skin: Skin is warm. No erythema.  Psychiatric: He has a normal mood and affect.  Well groomed, poor eye contact.    ASSESSMENT AND PLAN:   [name] [name] was seen today for edema.  Diagnoses and all orders for this visit:  Tobacco use disorder Some adverse effects of tobacco use discussed. He is not interested in smoking cessation.  Bilateral lower extremity edema This is a chronic problem. He is  not interested in diuretics or compression stockings. OTC horse chestnut seed extract 300 mg twice daily may help. Elevation of lower extremities above heart level also recommended. Instructed about good skin care.  DDD (degenerative disc disease), cervical OTC IcyHot or Aspercreme with lidocaine may help. He is not interested in muscle relaxants.  He will continue following with Dr. Primus Bravo for pain management.   Allergic rhinitis Explained that nose dryness and eye pruritus are most likely related to allergies. He is not interested in intranasal steroid. He could use saline nasal irrigations. F/U as needed.      Shenika Quint G. [name], MD  [facility]. [place] office.

## 2017-06-09 NOTE — Assessment & Plan Note (Signed)
This is a chronic problem. He is not interested in diuretics or compression stockings. OTC horse chestnut seed extract 300 mg twice daily may help. Elevation of lower extremities above heart level also recommended. Instructed about good skin care.

## 2017-06-09 NOTE — Assessment & Plan Note (Signed)
Some adverse effects of tobacco use discussed. He is not interested in smoking cessation.

## 2017-06-09 NOTE — Patient Instructions (Addendum)
A few things to remember from today's visit:   Bilateral lower extremity edema   Vein disease is a condition that can affect the veins in the legs. It can cause leg pain, varicose veins, swollen legs, or open sores. Varicose veins are swollen and twisted veins. Things that may help: leg exercises (ankle flexion, walking),compression stocking, OTC horse chestnut seed extract 300 mg twice daily, for itchy skin cortisone and moisturizers.  Compression stockings- [facility] in [place]   Over-the-counter IcyHot with lidocaine or Aspercreme with lidocaine my help with neck pain. Local ice.    Please be sure medication list is accurate. If a new problem present, please set up appointment sooner than planned today.

## 2017-06-09 NOTE — Assessment & Plan Note (Signed)
Explained that nose dryness and eye pruritus are most likely related to allergies. He is not interested in intranasal steroid. He could use saline nasal irrigations. F/U as needed.

## 2017-06-09 NOTE — Assessment & Plan Note (Addendum)
OTC IcyHot or Aspercreme with lidocaine may help. He is not interested in muscle relaxants.  He will continue following with Dr. Primus Bravo for pain management.

## 2017-07-10 ENCOUNTER — Encounter: Payer: Self-pay | Admitting: Emergency Medicine

## 2017-07-10 ENCOUNTER — Other Ambulatory Visit: Payer: Self-pay

## 2017-07-10 ENCOUNTER — Emergency Department
Admission: EM | Admit: 2017-07-10 | Discharge: 2017-07-10 | Disposition: A | Discharge status: Home / Self Care | Payer: Medicare HMO | Attending: Student in an Organized Health Care Education/Training Program | Admitting: Student in an Organized Health Care Education/Training Program

## 2017-07-10 DIAGNOSIS — L0291 Cutaneous abscess, unspecified: Secondary | ICD-10-CM

## 2017-07-10 DIAGNOSIS — F17210 Nicotine dependence, cigarettes, uncomplicated: Secondary | ICD-10-CM | POA: Insufficient documentation

## 2017-07-10 DIAGNOSIS — R69 Illness, unspecified: Secondary | ICD-10-CM | POA: Diagnosis not present

## 2017-07-10 DIAGNOSIS — Z79899 Other long term (current) drug therapy: Secondary | ICD-10-CM | POA: Diagnosis not present

## 2017-07-10 DIAGNOSIS — L02414 Cutaneous abscess of left upper limb: Secondary | ICD-10-CM | POA: Insufficient documentation

## 2017-07-10 DIAGNOSIS — R2232 Localized swelling, mass and lump, left upper limb: Secondary | ICD-10-CM | POA: Diagnosis present

## 2017-07-10 MED ORDER — SULFAMETHOXAZOLE-TRIMETHOPRIM 800-160 MG PO TABS
1.0000 | ORAL_TABLET | Freq: Once | ORAL | Status: AC
  Administered 2017-07-10: 1 via ORAL
  Filled 2017-07-10: qty 1

## 2017-07-10 MED ORDER — LIDOCAINE HCL (PF) 1 % IJ SOLN
INTRAMUSCULAR | Status: AC
  Filled 2017-07-10: qty 5

## 2017-07-10 MED ORDER — OXYCODONE HCL 5 MG PO TABS
5.0000 mg | ORAL_TABLET | Freq: Once | ORAL | Status: AC
Start: 2017-07-10 — End: 2017-07-10
  Administered 2017-07-10: 5 mg via ORAL
  Filled 2017-07-10: qty 1

## 2017-07-10 MED ORDER — SULFAMETHOXAZOLE-TRIMETHOPRIM 800-160 MG PO TABS: 1.0000 | ORAL_TABLET | Freq: Two times a day (BID) | ORAL | 0 refills | Status: DC

## 2017-07-10 MED ORDER — LIDOCAINE-EPINEPHRINE-TETRACAINE (LET) SOLUTION
3.0000 mL | Freq: Once | NASAL | Status: AC
  Administered 2017-07-10: 3 mL via TOPICAL
  Filled 2017-07-10: qty 3

## 2017-07-10 NOTE — Discharge Instructions (Signed)
If better in 2 days, pull out the packing and finish the antibiotic. If not better in 2 days, leave the packing in and either see primary care or return here.

## 2017-07-10 NOTE — ED Triage Notes (Signed)
Pt presents to ED with a "bug bite" on his left forearm. Pt states he noticed it a few days ago and it has been itching and burning and seems to be getting larger today. Denies hx of the same. Denies drainage from site.

## 2017-07-10 NOTE — ED Notes (Signed)
Pt has insect bite to left forearm for 3 days.  No drainage.  Pt has itching and stinging.

## 2017-07-10 NOTE — ED Provider Notes (Signed)
[facility] Emergency Department Provider Note  ____________________________________________  Time seen: Approximately 11:21 PM  I have reviewed the triage vital signs and the nursing notes.   HISTORY  Chief Complaint Insect Bite   HPI [name] [name] is a 77 y.o. male who presents to the emergency department for treatment and evaluation of swelling and pain to his left forearm.  He states he noticed it 3 or 4 nights ago.  Patient states he is never had any similar lesions in the past.  He states that it is sore, itches, and stings.  He is unsure what bit him.  He denies fever, chills, or other symptoms of concern.  Past Medical History:  Diagnosis Date  . Back pain   . Renal disorder    kidney stones  . Spinal stenosis, lumbar region, with neurogenic claudication 07/07/2014    Patient Active Problem List   Diagnosis Date Noted  . Allergic rhinitis 06/09/2017  . Tobacco use disorder 02/14/2017  . Osteoarthritis of multiple joints 02/14/2017  . Bilateral lower extremity edema 08/12/2016  . Chronic right-sided low back pain with right-sided sciatica 03/14/2016  . Chronic pain of right knee 03/14/2016  . DDD (degenerative disc disease), lumbar 07/07/2014  . DDD (degenerative disc disease), cervical 07/07/2014  . DJD (degenerative joint disease) of knee 07/07/2014  . Bilateral occipital neuralgia 07/07/2014  . Spinal stenosis, lumbar region, with neurogenic claudication 07/07/2014    Past Surgical History:  Procedure Laterality Date  . CATARACT EXTRACTION W/PHACO Left 08/22/2016   Procedure: CATARACT EXTRACTION PHACO AND INTRAOCULAR LENS PLACEMENT ([place]) Left;  Surgeon: [name], MD;  Location: [place];  Service: Ophthalmology;  Laterality: Left;  . CATARACT EXTRACTION W/PHACO Right 10/25/2016   Procedure: CATARACT EXTRACTION PHACO AND INTRAOCULAR LENS PLACEMENT ([place]) WITH ISTENT RIGHT  ;  Surgeon: [name], MD;   Location: [place];  Service: Ophthalmology;  Laterality: Right;  TOPICAL RIGHT  ISTENT   trabecular bypass stent was implanted/explanted.  . INGUINAL HERNIA REPAIR    . KNEE SURGERY    . NECK SURGERY      Prior to Admission medications   Medication Sig Start Date End Date Taking? Authorizing Provider  Oxycodone HCl 10 MG TABS Take 10 mg by mouth daily.    [provider]  sulfamethoxazole-trimethoprim (BACTRIM DS,SEPTRA DS) 800-160 MG tablet Take 1 tablet by mouth 2 (two) times daily. 07/10/17   Kylan Veach, Johnette Abraham B, FNP    Allergies No known allergies  Family History  Problem Relation Age of Onset  . Diabetes Mother   . Diabetes Brother   . Diabetes Sister     Social History Social History   Tobacco Use  . Smoking status: Current Every Day Smoker    Packs/day: 0.75    Types: Cigarettes  . Smokeless tobacco: Never Used  Substance Use Topics  . Alcohol use: No  . Drug use: No    Review of Systems  Constitutional: Negative for fever. Respiratory: Negative negative for cough or shortness of breath.  Musculoskeletal: Negative for myalgias Skin: Positive for lesion on the left forearm. Neurological: Negative for numbness or paresthesias. ____________________________________________   PHYSICAL EXAM:  VITAL SIGNS: ED Triage Vitals  Enc Vitals Group     BP 07/10/17 1926 (!) 146/75     Pulse Rate 07/10/17 1926 67     Resp 07/10/17 1926 18     Temp 07/10/17 1926 98.3 F (36.8 C)     Temp Source 07/10/17  1926 Oral     SpO2 07/10/17 1926 99 %     Weight 07/10/17 1927 160 lb (72.6 kg)     Height 07/10/17 1927 5\' 8"  (1.727 m)     Head Circumference --      Peak Flow --      Pain Score 07/10/17 1926 7     Pain Loc --      Pain Edu? --      Excl. in [place]? --      Constitutional: Well appearing. Eyes: Conjunctivae are clear without discharge or drainage. Nose: No rhinorrhea noted. Mouth/Throat: Airway is patent.  Neck: No stridor. Unrestricted  range of motion observed. Cardiovascular: Capillary refill is <3 seconds.  Respiratory: Respirations are even and unlabored.. Musculoskeletal: Unrestricted range of motion observed. Neurologic: Awake, alert, and oriented x 4.  Skin:  2cm fluctuant, tender, erythematous area on the left forearm.   ____________________________________________   LABS (all labs ordered are listed, but only abnormal results are displayed)  Labs Reviewed - No data to display ____________________________________________  EKG  Not indicated. ____________________________________________  RADIOLOGY  Not indicated. ____________________________________________   PROCEDURES  .Marland KitchenIncision and Drainage Date/Time: 07/10/2017 11:23 PM Performed by: Victorino Dike, FNP Authorized by: Victorino Dike, FNP   Consent:    Consent obtained:  Verbal   Consent given by:  Patient   Risks discussed:  Bleeding, infection, incomplete drainage and pain   Alternatives discussed:  Observation Location:    Type:  Abscess   Location:  Upper extremity   Upper extremity location:  Arm   Arm location:  L lower arm Pre-procedure details:    Skin preparation:  Betadine Anesthesia (see MAR for exact dosages):    Anesthesia method:  Local infiltration and topical application   Topical anesthetic:  LET   Local anesthetic:  Lidocaine 1% w/o epi Procedure type:    Complexity:  Complex Procedure details:    Incision types:  Stab incision   Incision depth:  Dermal   Scalpel blade:  11   Wound management:  Probed and deloculated   Drainage:  Purulent and bloody   Drainage amount:  Moderate   Wound treatment:  Drain placed   Packing materials:  1/4 in iodoform gauze Post-procedure details:    Patient tolerance of procedure:  Tolerated well, no immediate complications   ____________________________________________   INITIAL IMPRESSION / ASSESSMENT AND PLAN / ED COURSE  [name] [name] is a 77 y.o. male  who presents to the emergency department for treatment and evaluation of fluctuant lesion to the left forearm.  Incision and drainage performed with return of purulent and bloody drainage as well as a long ingrown hair.  Iodoform packing was placed.  If improved, he is to pull the packing in 2 days.  If not improved he is to either see his primary care provider or return here with the packing in place.  He will take Bactrim twice a day as prescribed.  Patient has wife verbalized instructions prior to discharge.  Medications  lidocaine (PF) (XYLOCAINE) 1 % injection (has no administration in time range)  lidocaine-EPINEPHrine-tetracaine (LET) solution (3 mLs Topical Given 07/10/17 2135)  oxyCODONE (Oxy IR/ROXICODONE) immediate release tablet 5 mg (5 mg Oral Given 07/10/17 2234)  sulfamethoxazole-trimethoprim (BACTRIM DS,SEPTRA DS) 800-160 MG per tablet 1 tablet (1 tablet Oral Given 07/10/17 2235)     Pertinent labs & imaging results that were available during my care of the patient were reviewed by me and considered in my  medical decision making (see chart for details). ____________________________________________   FINAL CLINICAL IMPRESSION(S) / ED DIAGNOSES  Final diagnoses:  Abscess    ED Discharge Orders        Ordered    sulfamethoxazole-trimethoprim (BACTRIM DS,SEPTRA DS) 800-160 MG tablet  2 times daily     07/10/17 2218       Note:  This document was prepared using Dragon voice recognition software and may include unintentional dictation errors.    Victorino Dike, FNP 07/10/17 2326    Merlyn Lot, MD 07/10/17 [id number]

## 2017-07-12 ENCOUNTER — Emergency Department
Admission: EM | Admit: 2017-07-12 | Discharge: 2017-07-12 | Disposition: A | Discharge status: Home / Self Care | Payer: Medicare HMO | Attending: Emergency Medicine | Admitting: Emergency Medicine

## 2017-07-12 ENCOUNTER — Emergency Department: Payer: Medicare HMO

## 2017-07-12 ENCOUNTER — Encounter: Payer: Self-pay | Admitting: Emergency Medicine

## 2017-07-12 DIAGNOSIS — L02414 Cutaneous abscess of left upper limb: Secondary | ICD-10-CM | POA: Diagnosis not present

## 2017-07-12 DIAGNOSIS — R69 Illness, unspecified: Secondary | ICD-10-CM | POA: Diagnosis not present

## 2017-07-12 DIAGNOSIS — F17210 Nicotine dependence, cigarettes, uncomplicated: Secondary | ICD-10-CM | POA: Diagnosis not present

## 2017-07-12 DIAGNOSIS — Z5189 Encounter for other specified aftercare: Secondary | ICD-10-CM

## 2017-07-12 DIAGNOSIS — Z79899 Other long term (current) drug therapy: Secondary | ICD-10-CM | POA: Insufficient documentation

## 2017-07-12 DIAGNOSIS — Z4801 Encounter for change or removal of surgical wound dressing: Secondary | ICD-10-CM | POA: Diagnosis not present

## 2017-07-12 DIAGNOSIS — L0291 Cutaneous abscess, unspecified: Secondary | ICD-10-CM

## 2017-07-12 MED ORDER — CLINDAMYCIN HCL 300 MG PO CAPS: 300.0000 mg | ORAL_CAPSULE | Freq: Three times a day (TID) | ORAL | 0 refills | Status: AC

## 2017-07-12 NOTE — ED Provider Notes (Signed)
[facility] Emergency Department Provider Note  ____________________________________________  Time seen: Approximately 5:52 PM  I have reviewed the triage vital signs and the nursing notes.   HISTORY  Chief Complaint Wound Check    HPI [name] [name] is a 77 y.o. male presents to the emergency department for a wound check for an abscess localized to the distal elbow.  Patient underwent incision and drainage 2 days ago and has been taking Bactrim as directed.  Patient has not changed his dressing since prior ED encounter.  He has noticed no fever or chills.  No alleviating measures have been attempted.   Past Medical History:  Diagnosis Date  . Back pain   . Renal disorder    kidney stones  . Spinal stenosis, lumbar region, with neurogenic claudication 07/07/2014    Patient Active Problem List   Diagnosis Date Noted  . Allergic rhinitis 06/09/2017  . Tobacco use disorder 02/14/2017  . Osteoarthritis of multiple joints 02/14/2017  . Bilateral lower extremity edema 08/12/2016  . Chronic right-sided low back pain with right-sided sciatica 03/14/2016  . Chronic pain of right knee 03/14/2016  . DDD (degenerative disc disease), lumbar 07/07/2014  . DDD (degenerative disc disease), cervical 07/07/2014  . DJD (degenerative joint disease) of knee 07/07/2014  . Bilateral occipital neuralgia 07/07/2014  . Spinal stenosis, lumbar region, with neurogenic claudication 07/07/2014    Past Surgical History:  Procedure Laterality Date  . CATARACT EXTRACTION W/PHACO Left 08/22/2016   Procedure: CATARACT EXTRACTION PHACO AND INTRAOCULAR LENS PLACEMENT ([place]) Left;  Surgeon: [name], MD;  Location: [place];  Service: Ophthalmology;  Laterality: Left;  . CATARACT EXTRACTION W/PHACO Right 10/25/2016   Procedure: CATARACT EXTRACTION PHACO AND INTRAOCULAR LENS PLACEMENT ([place]) WITH ISTENT RIGHT  ;  Surgeon: [name], MD;  Location:  [place];  Service: Ophthalmology;  Laterality: Right;  TOPICAL RIGHT  ISTENT   trabecular bypass stent was implanted/explanted.  . INGUINAL HERNIA REPAIR    . KNEE SURGERY    . NECK SURGERY      Prior to Admission medications   Medication Sig Start Date End Date Taking? Authorizing Provider  clindamycin (CLEOCIN) 300 MG capsule Take 1 capsule (300 mg total) by mouth 3 (three) times daily for 10 days. 07/12/17 07/22/17  Lannie Fields, PA-C  Oxycodone HCl 10 MG TABS Take 10 mg by mouth daily.    [provider]  sulfamethoxazole-trimethoprim (BACTRIM DS,SEPTRA DS) 800-160 MG tablet Take 1 tablet by mouth 2 (two) times daily. 07/10/17   Triplett, Johnette Abraham B, FNP    Allergies No known allergies  Family History  Problem Relation Age of Onset  . Diabetes Mother   . Diabetes Brother   . Diabetes Sister     Social History Social History   Tobacco Use  . Smoking status: Current Every Day Smoker    Packs/day: 0.75    Types: Cigarettes  . Smokeless tobacco: Never Used  Substance Use Topics  . Alcohol use: No  . Drug use: No     Review of Systems  Constitutional: No fever/chills Eyes: No visual changes. No discharge ENT: No upper respiratory complaints. Cardiovascular: no chest pain. Respiratory: no cough. No SOB. Gastrointestinal: No abdominal pain.  No nausea, no vomiting.  No diarrhea.  No constipation. Genitourinary: Negative for dysuria. No hematuria Musculoskeletal: Negative for musculoskeletal pain. Skin: Patient has resolving abscess.  Neurological: Negative for headaches, focal weakness or numbness.   ____________________________________________   PHYSICAL EXAM:  VITAL SIGNS: ED Triage Vitals  Enc Vitals Group     BP 07/12/17 1555 (!) 168/75     Pulse Rate 07/12/17 1555 (!) 54     Resp 07/12/17 1555 18     Temp 07/12/17 1555 98.4 F (36.9 C)     Temp Source 07/12/17 1555 Oral     SpO2 07/12/17 1555 98 %     Weight 07/12/17 1556 160 lb  (72.6 kg)     Height 07/12/17 1556 5\' 8"  (1.727 m)     Head Circumference --      Peak Flow --      Pain Score 07/12/17 1555 2     Pain Loc --      Pain Edu? --      Excl. in [place]? --      Constitutional: Alert and oriented. Well appearing and in no acute distress. Eyes: Conjunctivae are normal. PERRL. EOMI. Head: Atraumatic. Cardiovascular: Normal rate, regular rhythm. Normal S1 and S2.  Good peripheral circulation. Respiratory: Normal respiratory effort without tachypnea or retractions. Lungs CTAB. Good air entry to the bases with no decreased or absent breath sounds. Musculoskeletal: Full range of motion to all extremities. No gross deformities appreciated. Neurologic:  Normal speech and language. No gross focal neurologic deficits are appreciated.  Skin: Patient has resolving abscess along left forearm with surrounding induration. Psychiatric: Mood and affect are normal. Speech and behavior are normal. Patient exhibits appropriate insight and judgement.   ____________________________________________   LABS (all labs ordered are listed, but only abnormal results are displayed)  Labs Reviewed - No data to display ____________________________________________  EKG   ____________________________________________  RADIOLOGY I personally viewed and evaluated these images as part of my medical decision making, as well as reviewing the written report by the radiologist.  [place] Lt Upper Extrem Ltd Soft Tissue Non Vascular  Result Date: 07/12/2017 CLINICAL DATA:  Left forearm abscess drained 2 days ago. EXAM: ULTRASOUND LEFT UPPER EXTREMITY LIMITED TECHNIQUE: Ultrasound examination of the upper extremity soft tissues was performed in the area of clinical concern. COMPARISON:  None. FINDINGS: Focused ultrasound in the area of clinical concern along the posterior left proximal forearm demonstrates a small, irregular area of complex fluid measuring approximately 1.2 x 1.5 x 0.9 cm. This  appears to drain to the skin surface. There is mild surrounding hyperemia. IMPRESSION: 1. Small, 1.5 cm irregular area of complex fluid along the posterior left proximal forearm which drains to the skin surface and may represent a small residual abscess versus phlegmon. Electronically Signed   By: Titus Dubin M.D.   On: 07/12/2017 17:40    ____________________________________________    PROCEDURES  Procedure(s) performed:    Procedures    Medications - No data to display   ____________________________________________   INITIAL IMPRESSION / ASSESSMENT AND PLAN / ED COURSE  Pertinent labs & imaging results that were available during my care of the patient were reviewed by me and considered in my medical decision making (see chart for details).  Review of the [place] CSRS was performed in accordance of the [place] prior to dispensing any controlled drugs.      Assessment and plan Left forearm abscess Patient presents to the emergency department for a wound check regarding a resolving left forearm abscess.  Patient underwent an ultrasound of the affected area to rule out loculated intramuscular fluid accumulation.  Ultrasound examination was reassuring with a residual 1-1/2 cm abscess connecting to the skin.  Packing was removed in the emergency  department and expression of a modest amount of purulent exudate occurred.  I added clindamycin to patient's antibiotic regimen.  Strict return precautions were given if induration does not improve.  Vital signs were reassuring prior to discharge.  All patient questions were answered.    ____________________________________________  FINAL CLINICAL IMPRESSION(S) / ED DIAGNOSES  Final diagnoses:  Abscess  Wound check, abscess      NEW MEDICATIONS STARTED DURING THIS VISIT:  ED Discharge Orders        Ordered    clindamycin (CLEOCIN) 300 MG capsule  3 times daily     07/12/17 1750          This chart was dictated using voice  recognition software/Dragon. Despite best efforts to proofread, errors can occur which can change the meaning. Any change was purely unintentional.    Lannie Fields, PA-C 07/12/17 1757    Nena Polio, MD 07/12/17 2246

## 2017-07-12 NOTE — ED Triage Notes (Signed)
Pt was told to come back today for a wound check. Pt had infected area on pt's left arm drained 2 days ago. Pt denies any new complaints. Dressing clean and intact

## 2017-07-12 NOTE — ED Notes (Signed)
Topez not working 

## 2017-07-14 ENCOUNTER — Ambulatory Visit: Payer: Self-pay | Admitting: *Deleted

## 2017-07-14 NOTE — Telephone Encounter (Signed)
Noted  

## 2017-07-14 NOTE — Telephone Encounter (Signed)
Pt called with having a bug bite on his left arm below his elbow, about 4 or 5 days ago. Went to the ED on Tues because he felt like it was getting worst. He was started on an antibiotic. Thurs he went back to the ED and the area was lanced and put on an additional antibiotic. He has a dressing on it now.  He states that he now has more swelling above his elbow. Denies fever. Just feels like it is not getting better.  Appointment scheduled per protocol. Will route to LB at [place].  Home care advice given to patient with verbal understanding. Reason for Disposition . [1] Red or very tender (to touch) area AND [2] getting larger over 48 hours after the bite  Answer Assessment - Initial Assessment Questions 1. LOCATION: "Where are the bites located?"      Left arm below his elbow 2. ONSET: "When did you get bitten?"      4 or 5 days ago before going to the emergency room 3. CAUSE: Why do you suspect bed bugs?" (e.g., recent travel, recent purchase of used clothing)     Does not know 4. REDNESS: "Is the area red or pink?" If so, ask "What size is area of redness?" (inches or cm). "When did the redness start?"     Looked like a boil and reddish on the tip 5. ITCHING: "Does it itch?" If so, ask: "How bad is the itch?"    - MILD: doesn't interfere with normal activities   - MODERATE-SEVERE: interferes with work, school, sleep, or other activities      Itch and throbbing, does not interfere with activities 6. SWELLING: "How big is the swelling?" (inches, cm, or compare to coins)     Now the size of a quarter (after going to the ED) 7. OTHER SYMPTOMS: "Do you have any other symptoms?"  (e.g., difficulty breathing, hives)     no  Protocols used: BED BUG BITE-A-AH

## 2017-07-14 NOTE — Progress Notes (Deleted)
No chief complaint on file.   HPI: [name] [name] 77 y.o. Patient comes in today for [facility] Saturday clinic for post ed evaluation opn June 12  For abscess  Had I and d  June 10 and fu June 12   Then called   See triage      Pt called with having a bug bite on his left arm below his elbow, about 4 or 5 days ago. Went to the ED on Tues because he felt like it was getting worst. He was started on an antibiotic. Thurs he went back to the ED and the area was lanced and put on an additional antibiotic. He has a dressing on it now.  He states that he now has more swelling above his elbow. Denies fever. Just feels like it is not getting better.  Appointment scheduled per protocol. Will route to LB at [place].  Home care advice given to patient with verbal understanding. Reason for Disposition . [1] Red or very tender (to touch) area AND [2] getting larger over 48 hours after the bite            ROS: See pertinent positives and negatives per HPI.  Past Medical History:  Diagnosis Date  . Back pain   . Renal disorder    kidney stones  . Spinal stenosis, lumbar region, with neurogenic claudication 07/07/2014    Family History  Problem Relation Age of Onset  . Diabetes Mother   . Diabetes Brother   . Diabetes Sister     Social History   Socioeconomic History  . Marital status: Widowed    Spouse name: Not on file  . Number of children: Not on file  . Years of education: Not on file  . Highest education level: Not on file  Occupational History  . Not on file  Social Needs  . Financial resource strain: Not on file  . Food insecurity:    Worry: Not on file    Inability: Not on file  . Transportation needs:    Medical: Not on file    Non-medical: Not on file  Tobacco Use  . Smoking status: Current Every Day Smoker    Packs/day: 0.75    Types: Cigarettes  . Smokeless tobacco: Never Used  Substance and Sexual Activity  . Alcohol use: No  . Drug use: No  .  Sexual activity: Not on file  Lifestyle  . Physical activity:    Days per week: Not on file    Minutes per session: Not on file  . Stress: Not on file  Relationships  . Social connections:    Talks on phone: Not on file    Gets together: Not on file    Attends religious service: Not on file    Active member of club or organization: Not on file    Attends meetings of clubs or organizations: Not on file    Relationship status: Not on file  Other Topics Concern  . Not on file  Social History Narrative  . Not on file    Outpatient Medications Prior to Visit  Medication Sig Dispense Refill  . clindamycin (CLEOCIN) 300 MG capsule Take 1 capsule (300 mg total) by mouth 3 (three) times daily for 10 days. 30 capsule 0  . Oxycodone HCl 10 MG TABS Take 10 mg by mouth daily.    [name] sulfamethoxazole-trimethoprim (BACTRIM DS,SEPTRA DS) 800-160 MG tablet Take 1 tablet by mouth 2 (two) times daily. 20 tablet 0  No facility-administered medications prior to visit.      EXAM:  There were no vitals taken for this visit.  There is no height or weight on file to calculate BMI.  GENERAL: vitals reviewed and listed above, alert, oriented, appears well hydrated and in no acute distress HEENT: atraumatic, conjunctiva  clear, no obvious abnormalities on inspection of external nose and ears OP : no lesion edema or exudate  NECK: no obvious masses on inspection palpation  LUNGS: clear to auscultation bilaterally, no wheezes, rales or rhonchi, good air movement CV: HRRR, no clubbing cyanosis or  peripheral edema nl cap refill  MS: moves all extremities without noticeable focal  abnormality PSYCH: pleasant and cooperative, no obvious depression or anxiety  no culture seen   reveiwed last 2 ed notes  ASSESSMENT AND PLAN:  Discussed the following assessment and plan:  No diagnosis found.  -Patient advised to return or notify health care team  if symptoms worsen ,persist or new concerns  arise.  There are no Patient Instructions on file for this visit.   Standley Brooking. Jerran Tappan M.D.

## 2017-07-15 ENCOUNTER — Ambulatory Visit: Payer: Medicare HMO | Admitting: Internal Medicine

## 2017-07-15 DIAGNOSIS — Z0289 Encounter for other administrative examinations: Secondary | ICD-10-CM

## 2017-07-26 DIAGNOSIS — Z5181 Encounter for therapeutic drug level monitoring: Secondary | ICD-10-CM | POA: Diagnosis not present

## 2017-07-26 DIAGNOSIS — M5030 Other cervical disc degeneration, unspecified cervical region: Secondary | ICD-10-CM | POA: Diagnosis not present

## 2017-07-26 DIAGNOSIS — G629 Polyneuropathy, unspecified: Secondary | ICD-10-CM | POA: Diagnosis not present

## 2017-07-26 DIAGNOSIS — I739 Peripheral vascular disease, unspecified: Secondary | ICD-10-CM | POA: Diagnosis not present

## 2017-09-22 ENCOUNTER — Other Ambulatory Visit: Payer: Self-pay

## 2017-09-22 ENCOUNTER — Emergency Department (HOSPITAL_COMMUNITY)
Admission: EM | Admit: 2017-09-22 | Discharge: 2017-09-23 | Disposition: A | Discharge status: Home / Self Care | Payer: Medicare HMO | Attending: Emergency Medicine | Admitting: Emergency Medicine

## 2017-09-22 ENCOUNTER — Emergency Department (HOSPITAL_COMMUNITY): Payer: Medicare HMO

## 2017-09-22 ENCOUNTER — Encounter (HOSPITAL_COMMUNITY): Payer: Self-pay | Admitting: *Deleted

## 2017-09-22 DIAGNOSIS — M25512 Pain in left shoulder: Secondary | ICD-10-CM | POA: Diagnosis not present

## 2017-09-22 DIAGNOSIS — M542 Cervicalgia: Secondary | ICD-10-CM | POA: Diagnosis not present

## 2017-09-22 DIAGNOSIS — R69 Illness, unspecified: Secondary | ICD-10-CM | POA: Diagnosis not present

## 2017-09-22 DIAGNOSIS — M545 Low back pain: Secondary | ICD-10-CM | POA: Diagnosis present

## 2017-09-22 DIAGNOSIS — M549 Dorsalgia, unspecified: Secondary | ICD-10-CM | POA: Diagnosis not present

## 2017-09-22 DIAGNOSIS — M25552 Pain in left hip: Secondary | ICD-10-CM | POA: Diagnosis not present

## 2017-09-22 DIAGNOSIS — M5442 Lumbago with sciatica, left side: Secondary | ICD-10-CM | POA: Insufficient documentation

## 2017-09-22 DIAGNOSIS — F17210 Nicotine dependence, cigarettes, uncomplicated: Secondary | ICD-10-CM | POA: Insufficient documentation

## 2017-09-22 DIAGNOSIS — G8929 Other chronic pain: Secondary | ICD-10-CM | POA: Insufficient documentation

## 2017-09-22 DIAGNOSIS — Z79899 Other long term (current) drug therapy: Secondary | ICD-10-CM | POA: Diagnosis not present

## 2017-09-22 MED ORDER — DEXAMETHASONE SODIUM PHOSPHATE 10 MG/ML IJ SOLN
10.0000 mg | Freq: Once | INTRAMUSCULAR | Status: AC
  Administered 2017-09-22: 10 mg via INTRAMUSCULAR
  Filled 2017-09-22: qty 1

## 2017-09-22 MED ORDER — MORPHINE SULFATE (PF) 4 MG/ML IV SOLN
4.0000 mg | Freq: Once | INTRAVENOUS | Status: AC
  Administered 2017-09-22: 4 mg via INTRAMUSCULAR
  Filled 2017-09-22: qty 1

## 2017-09-22 NOTE — ED Triage Notes (Signed)
The pt is c/o lower back and neck pain  For years  Worse for the past 3 months  Hx chronic back pain

## 2017-09-22 NOTE — ED Notes (Signed)
Pt family to desk upset about wait times. Reassured EDPA would be with them as soon as possible

## 2017-09-22 NOTE — ED Provider Notes (Signed)
[facility]ENC[facility] DEPARTMENT Provider Note   CSN: [id number] Arrival date & time: 09/22/17  7564     History   Chief Complaint Chief Complaint  Patient presents with  . Back Pain    HPI [name] [name] is a 77 y.o. male.  HPI  Patient is a 20-year male with a history of degenerative disc disease of cervical and lumbar spine, history of cervical fusion, spinal stenosis, allergic rhinitis, bilateral lower extremity edema, and tobacco use disorder presenting for low back pain, neck, left shoulder pain.  Patient reports that his pain is been increasing for "some years", and his insurance no longer covers the pain clinic he was going to.  Patient reports that he still has oxycodone 10 mg tablets, but they are not helping.  Patient reports pain with any kind of movement of his lower back and is particularly worse in his left lower sacroiliac region.  Patient also reports left hip pain.  Patient denies any increase in his pain suddenly, weakness or numbness that is new in his lower extremities, saddle anesthesia, loss of our bladder control, fever or chills, or history of cancer.  Patient is reporting that he has had left-sided neck pain rating down to his left shoulder, and hurts to do any kind of rotational movements of the shoulder.  Patient reports he has a history of 2 prior episodes of major shoulder injury many decades ago.  No recent falls or trauma.  Past Medical History:  Diagnosis Date  . Back pain   . Renal disorder    kidney stones  . Spinal stenosis, lumbar region, with neurogenic claudication 07/07/2014    Patient Active Problem List   Diagnosis Date Noted  . Allergic rhinitis 06/09/2017  . Tobacco use disorder 02/14/2017  . Osteoarthritis of multiple joints 02/14/2017  . Bilateral lower extremity edema 08/12/2016  . Chronic right-sided low back pain with right-sided sciatica 03/14/2016  . Chronic pain of right knee 03/14/2016  . DDD  (degenerative disc disease), lumbar 07/07/2014  . DDD (degenerative disc disease), cervical 07/07/2014  . DJD (degenerative joint disease) of knee 07/07/2014  . Bilateral occipital neuralgia 07/07/2014  . Spinal stenosis, lumbar region, with neurogenic claudication 07/07/2014    Past Surgical History:  Procedure Laterality Date  . CATARACT EXTRACTION W/PHACO Left 08/22/2016   Procedure: CATARACT EXTRACTION PHACO AND INTRAOCULAR LENS PLACEMENT ([place]) Left;  Surgeon: [name], MD;  Location: [facility];  Service: Ophthalmology;  Laterality: Left;  . CATARACT EXTRACTION W/PHACO Right 10/25/2016   Procedure: CATARACT EXTRACTION PHACO AND INTRAOCULAR LENS PLACEMENT ([place]) WITH ISTENT RIGHT  ;  Surgeon: [name], MD;  Location: [facility];  Service: Ophthalmology;  Laterality: Right;  TOPICAL RIGHT  ISTENT   trabecular bypass stent was implanted/explanted.  . INGUINAL HERNIA REPAIR    . KNEE SURGERY    . NECK SURGERY          Home Medications    Prior to Admission medications   Medication Sig Start Date End Date Taking? Authorizing Provider  Oxycodone HCl 10 MG TABS Take 10 mg by mouth daily.    [provider]  sulfamethoxazole-trimethoprim (BACTRIM DS,SEPTRA DS) 800-160 MG tablet Take 1 tablet by mouth 2 (two) times daily. 07/10/17   Victorino Dike, FNP    Family History Family History  Problem Relation Age of Onset  . Diabetes Mother   . Diabetes Brother   . Diabetes Sister     Social History  Social History   Tobacco Use  . Smoking status: Current Every Day Smoker    Packs/day: 0.75    Types: Cigarettes  . Smokeless tobacco: Never Used  Substance Use Topics  . Alcohol use: No  . Drug use: No     Allergies   No known allergies   Review of Systems Review of Systems  Constitutional: Negative for chills and fever.  Gastrointestinal: Negative for abdominal pain, nausea and vomiting.  Genitourinary: Negative for flank  pain.  Musculoskeletal: Positive for arthralgias, back pain and neck pain.  Skin: Negative for wound.  Allergic/Immunologic: Negative for immunocompromised state.  Neurological: Negative for weakness and numbness.     Physical Exam Updated Vital Signs BP (!) 168/97   Pulse (!) 57   Temp 98.8 F (37.1 C) (Oral)   Resp 16   Ht 5\' 7"  (1.702 m)   Wt 69.9 kg   SpO2 98%   BMI 24.12 kg/m   Physical Exam  Constitutional: He appears well-developed and well-nourished. No distress.  Sitting comfortably in bed.  HENT:  Head: Normocephalic and atraumatic.  Eyes: Conjunctivae are normal. Right eye exhibits no discharge. Left eye exhibits no discharge.  EOMs normal to gross examination.  Neck: Normal range of motion.  Cardiovascular: Normal rate and regular rhythm.  Pulses:      Dorsalis pedis pulses are 2+ on the right side, and 2+ on the left side.  Pulmonary/Chest:  Normal respiratory effort. Patient converses comfortably. No audible wheeze or stridor.  Abdominal: Soft. He exhibits no distension. There is no tenderness. There is no guarding.  Musculoskeletal: Normal range of motion.  Spine Exam: Inspection/Palpation: No midline tenderness of cervical, thoracic, or lumbar spine.  Patient has left paraspinal muscular tenderness over sacroiliac joint. Strength: 5/5 throughout LE bilaterally (hip flexion/extension, adduction/abduction; knee flexion/extension; foot dorsiflexion/plantarflexion, inversion/eversion; great toe inversion) Sensation: Intact to light touch in proximal and distal LE bilaterally Reflexes: 2+ quadriceps and achilles reflexes Patient exhibits shuffling gait, with slightly flexed lumbar spine.  Left shoulder with tenderness to palpation as depicted in image. Decreased ROM with abduction/internal rotation. + empty can test, + Neer's. No swelling, erythema or ecchymosis present. No step-off, crepitus, or deformity appreciated. 5/5 muscle strength of UE. 2+ radial pulse,  sensation intact and all compartments soft.  Neurological: He is alert.  Cranial nerves intact to gross observation. Patient moves extremities without difficulty.  Skin: Skin is warm and dry. He is not diaphoretic.  Psychiatric: He has a normal mood and affect. His behavior is normal. Judgment and thought content normal.  Nursing note and vitals reviewed.    ED Treatments / Results  Labs (all labs ordered are listed, but only abnormal results are displayed) Labs Reviewed - No data to display  EKG None  Radiology Dg Cervical Spine Complete  Result Date: 09/22/2017 CLINICAL DATA:  Neck pain EXAM: CERVICAL SPINE - COMPLETE 4+ VIEW COMPARISON:  MRI 06/24/2015, radiograph 09/08/2009 FINDINGS: Poor visualization of C7 and below. Anterior plate and screw fixation C4-C5 with bone fusion present. Moderate to marked degenerative narrowing C2-C3, C3-C4 and C5-C6. Normal prevertebral soft tissue thickness. Dens and lateral masses are within normal limits. IMPRESSION: Inadequate visualization C7 and below. Straightening of the cervical spine with plate and fixating screws at C4-C5. Moderate severe diffuse degenerative change of the cervical spine. Electronically Signed   By: Donavan Foil M.D.   On: 09/22/2017 22:43   Dg Lumbar Spine Complete  Result Date: 09/22/2017 CLINICAL DATA:  Back pain EXAM: LUMBAR  SPINE - COMPLETE 4+ VIEW COMPARISON:  03/14/2016, MRI 03/22/2016 FINDINGS: Suspected transitional anatomy. For the purposes of reporting, last well-formed vertebra will be designated L5. Lumbar alignment within normal limits. Moderate diffuse degenerative changes with disc space narrowing and prominent osteophytes. Disc disease is worst at L1-L2 and L2-L3. Vertebral body heights are normal. Prominent posterior facet degenerative changes of the lower lumbar spine. IMPRESSION: Moderate degenerative changes without acute osseous abnormality Electronically Signed   By: Donavan Foil M.D.   On: 09/22/2017  22:47   Dg Shoulder Left  Result Date: 09/22/2017 CLINICAL DATA:  Shoulder pain EXAM: LEFT SHOULDER - 2+ VIEW COMPARISON:  12/16/2016 FINDINGS: Moderate AC joint degenerative change and glenohumeral degenerative change. Old left-sided rib fractures. No fracture or malalignment. IMPRESSION: Moderate degenerative changes of the left shoulder. No acute osseous abnormality Electronically Signed   By: Donavan Foil M.D.   On: 09/22/2017 22:43   Dg Hip Unilat W Or Wo Pelvis 2-3 Views Left  Result Date: 09/22/2017 CLINICAL DATA:  Hip pain EXAM: DG HIP (WITH OR WITHOUT PELVIS) 2-3V LEFT COMPARISON:  CT 12/16/2016 FINDINGS: SI joints are patent. Pubic symphysis and rami are intact. Both femoral heads project in joint. No fracture or malalignment. Mild degenerative changes of both hips. Vascular calcifications. IMPRESSION: Mild arthritis of the hips.  No acute osseous abnormality Electronically Signed   By: Donavan Foil M.D.   On: 09/22/2017 22:48    Procedures Procedures (including critical care time)  Medications Ordered in ED Medications  dexamethasone (DECADRON) injection 10 mg (has no administration in time range)  morphine 4 MG/ML injection 4 mg (has no administration in time range)     Initial Impression / Assessment and Plan / ED Course  I have reviewed the triage vital signs and the nursing notes.  Pertinent labs & imaging results that were available during my care of the patient were reviewed by me and considered in my medical decision making (see chart for details).     Patient denies any concerning symptoms suggestive of cauda equina requiring urgent imaging at this time such as loss of sensation in the lower extremities, lower extremity weakness, loss of bowel or bladder control, saddle anesthesia, urinary retention, fever/chills, IVDU. Exam demonstrated no  weakness on exam today. No preceding injury or trauma to suggest acute fracture. Doubt pelvic or urinary pathology for patient's  acute back pain, as patient denies urinary symptoms. Doubt AAA as cause of patient's back pain as patient had no abdominal TTP, and has symmetric and intact distal pulses.  Patient does not have any red flag signs for myelopathy's of the cervical spine and examination of the upper extremities is isolated to the left shoulder.  Suspect chronic rotator cuff arthropathy.  Patient's imaging unremarkable for any acute changes or lytic lesions.   Patient given strict return precautions for any symptoms indicating worsening neurologic function in the lower extremities.  Encouraged patient to continue using home oxycodone until additional pain management consultation is made later in the week, and discussed safe dosing with patient.  Also encouraged conservative management such as Salon PAS patches or Arnicare gel.  This is a shared visit with Dr. Fredia Sorrow. Patient was independently evaluated by this attending physician. Attending physician consulted in evaluation and discharge management.  Final Clinical Impressions(s) / ED Diagnoses   Final diagnoses:  Chronic left shoulder pain  Chronic left-sided low back pain with left-sided sciatica    ED Discharge Orders    None  Albesa Seen, PA-C 09/23/17 0031    Fredia Sorrow, MD 10/01/17 [phone]

## 2017-09-23 DIAGNOSIS — Z79899 Other long term (current) drug therapy: Secondary | ICD-10-CM | POA: Diagnosis not present

## 2017-09-23 DIAGNOSIS — M25512 Pain in left shoulder: Secondary | ICD-10-CM | POA: Diagnosis not present

## 2017-09-23 DIAGNOSIS — R69 Illness, unspecified: Secondary | ICD-10-CM | POA: Diagnosis not present

## 2017-09-23 DIAGNOSIS — M5442 Lumbago with sciatica, left side: Secondary | ICD-10-CM | POA: Diagnosis not present

## 2017-09-23 DIAGNOSIS — G8929 Other chronic pain: Secondary | ICD-10-CM | POA: Diagnosis not present

## 2017-09-23 MED ORDER — LIDOCAINE 5 % EX PTCH
1.0000 | MEDICATED_PATCH | CUTANEOUS | Status: DC
  Administered 2017-09-23: 1 via TRANSDERMAL
  Filled 2017-09-23: qty 1

## 2017-09-23 NOTE — Discharge Instructions (Signed)
Please see the information and instructions below regarding your visit.  Your diagnoses today include:  1. Chronic left shoulder pain   2. Chronic left-sided low back pain with left-sided sciatica    Regarding requiring assistance at home, I placed a consultation to case management to assist with something called face-to-face, which is in order where an assessment will be done at your home for physical therapy and other resources.  About diagnosis. Most episodes of acute low back pain are self-limited. Your exam was reassuring today that the source of your pain is not affecting the spinal cord and nerves that originate in the spinal cord.   If you have a history of disc herniation or arthritis in your spine, the nerves exiting the spine on one side get inflamed. This can cause severe pain. We call this radiculopathy. We do not always know what causes the sudden inflammation.  Tests performed today include: See side panel of your discharge paperwork for testing performed today. Vital signs are listed at the bottom of these instructions.   X-ray showed continuing degenerative changes, but no new signs that look like fracture or lesions that have infiltrated the spine.  Medications prescribed:    Take any prescribed medications only as prescribed, and any over the counter medications only as directed on the packaging.  In addition to your oxycodone at home, please apply Salon PAS patches to the lower back and left shoulder or Arnicare gel.   Home care instructions:   Low back pain gets worse the longer you stay stationary. Please keep moving and walking as tolerated. There are exercises included in this packet to perform as tolerated for your low back pain.   Apply heat to the areas that are painful. Avoid twisting or bending your trunk to lift something. Do not lift anything above 25 lbs while recovering from this flare of low back pain.  Please follow any educational materials contained  in this packet.   Follow-up instructions: Please follow-up with your primary care provider as soon as possible for further evaluation of your symptoms if they are not completely improved.   Please follow up with your pain management clinic as soon as possible.   Return instructions:  Please return to the Emergency Department if you experience worsening symptoms.  Please return for any fever or chills in the setting of your back pain, weakness in the muscles of the legs, numbness in your legs and feet that is new or changing, numbness in the area where you wipe, retention of your urine, loss of bowel or bladder control, or problems with walking. Please return if you have any other emergent concerns.  Additional Information:   Your vital signs today were: BP (!) 168/97    Pulse (!) 57    Temp 98.8 F (37.1 C) (Oral)    Resp 16    Ht 5\' 7"  (1.702 m)    Wt 69.9 kg    SpO2 98%    BMI 24.12 kg/m  If your blood pressure (BP) was elevated on multiple readings during this visit above 130 for the top number or above 80 for the bottom number, please have this repeated by your primary care provider within one month. --------------  Thank you for allowing [place] to participate in your care today.

## 2017-09-23 NOTE — ED Provider Notes (Signed)
Medical screening examination/treatment/procedure(s) were conducted as a shared visit with non-physician practitioner(s) and myself.  I personally evaluated the patient during the encounter.  None   Results for orders placed or performed during the hospital encounter of 12/16/16  Urine culture  Result Value Ref Range   Specimen Description URINE, RANDOM    Special Requests NONE    Culture (A)     <10,000 COLONIES/mL INSIGNIFICANT GROWTH Performed at [facility] [address] [place], [place] [address]    Report Status 12/17/2016 FINAL   CBC with Differential  Result Value Ref Range   WBC 17.4 (H) 3.8 - 10.6 K/uL   RBC 6.06 (H) 4.40 - 5.90 MIL/uL   Hemoglobin 15.5 13.0 - 18.0 g/dL   HCT 48.7 40.0 - 52.0 %   MCV 80.4 80.0 - 100.0 fL   MCH 25.6 (L) 26.0 - 34.0 pg   MCHC 31.9 (L) 32.0 - 36.0 g/dL   RDW 14.7 (H) 11.5 - 14.5 %   Platelets 185 150 - 440 K/uL   Neutrophils Relative % 82 %   Neutro Abs 14.4 (H) 1.4 - 6.5 K/uL   Lymphocytes Relative 12 %   Lymphs Abs 2.0 1.0 - 3.6 K/uL   Monocytes Relative 5 %   Monocytes Absolute 0.9 0.2 - 1.0 K/uL   Eosinophils Relative 0 %   Eosinophils Absolute 0.0 0 - 0.7 K/uL   Basophils Relative 1 %   Basophils Absolute 0.1 0 - 0.1 K/uL  Salicylate level  Result Value Ref Range   Salicylate Lvl <6.7 2.8 - 30.0 mg/dL  Acetaminophen level  Result Value Ref Range   Acetaminophen (Tylenol), Serum <10 (L) 10 - 30 ug/mL  Comprehensive metabolic panel  Result Value Ref Range   Sodium 134 (L) 135 - 145 mmol/L   Potassium 4.9 3.5 - 5.1 mmol/L   Chloride 99 (L) 101 - 111 mmol/L   CO2 25 22 - 32 mmol/L   Glucose, Bld 108 (H) 65 - 99 mg/dL   BUN 17 6 - 20 mg/dL   Creatinine, Ser 1.19 0.61 - 1.24 mg/dL   Calcium 9.0 8.9 - 10.3 mg/dL   Total Protein 7.4 6.5 - 8.1 g/dL   Albumin 4.0 3.5 - 5.0 g/dL   AST 25 15 - 41 U/L   ALT 14 (L) 17 - 63 U/L   Alkaline Phosphatase 98 38 - 126 U/L   Total Bilirubin 1.4 (H) 0.3 - 1.2 mg/dL   GFR calc  non Af Amer 58 (L) >60 mL/min   GFR calc Af Amer >60 >60 mL/min   Anion gap 10 5 - 15  Troponin I  Result Value Ref Range   Troponin I <0.03 <0.03 ng/mL  Urinalysis, Complete w Microscopic  Result Value Ref Range   Color, Urine YELLOW (A) YELLOW   APPearance CLEAR (A) CLEAR   Specific Gravity, Urine 1.016 1.005 - 1.030   pH 8.0 5.0 - 8.0   Glucose, UA NEGATIVE NEGATIVE mg/dL   Hgb urine dipstick NEGATIVE NEGATIVE   Bilirubin Urine NEGATIVE NEGATIVE   Ketones, ur NEGATIVE NEGATIVE mg/dL   Protein, ur 30 (A) NEGATIVE mg/dL   Nitrite NEGATIVE NEGATIVE   Leukocytes, UA NEGATIVE NEGATIVE   RBC / HPF 0-5 0 - 5 RBC/hpf   WBC, UA 0-5 0 - 5 WBC/hpf   Bacteria, UA NONE SEEN NONE SEEN   Squamous Epithelial / LPF 0-5 (A) NONE SEEN   Mucus PRESENT   Lactic acid, plasma  Result  Value Ref Range   Lactic Acid, Venous 1.5 0.5 - 1.9 mmol/L  Urine Drug Screen, Qualitative  Result Value Ref Range   Tricyclic, Ur Screen NONE DETECTED NONE DETECTED   Amphetamines, Ur Screen NONE DETECTED NONE DETECTED   MDMA (Ecstasy)Ur Screen NONE DETECTED NONE DETECTED   Cocaine Metabolite,Ur [place] NONE DETECTED NONE DETECTED   Opiate, Ur Screen NONE DETECTED NONE DETECTED   Phencyclidine (PCP) Ur S NONE DETECTED NONE DETECTED   Cannabinoid 50 Ng, Ur [place] NONE DETECTED NONE DETECTED   Barbiturates, Ur Screen NONE DETECTED NONE DETECTED   Benzodiazepine, Ur Scrn NONE DETECTED NONE DETECTED   Methadone Scn, Ur NONE DETECTED NONE DETECTED   Dg Cervical Spine Complete  Result Date: 09/22/2017 CLINICAL DATA:  Neck pain EXAM: CERVICAL SPINE - COMPLETE 4+ VIEW COMPARISON:  MRI 06/24/2015, radiograph 09/08/2009 FINDINGS: Poor visualization of C7 and below. Anterior plate and screw fixation C4-C5 with bone fusion present. Moderate to marked degenerative narrowing C2-C3, C3-C4 and C5-C6. Normal prevertebral soft tissue thickness. Dens and lateral masses are within normal limits. IMPRESSION: Inadequate visualization C7 and  below. Straightening of the cervical spine with plate and fixating screws at C4-C5. Moderate severe diffuse degenerative change of the cervical spine. Electronically Signed   By: Donavan Foil M.D.   On: 09/22/2017 22:43   Dg Lumbar Spine Complete  Result Date: 09/22/2017 CLINICAL DATA:  Back pain EXAM: LUMBAR SPINE - COMPLETE 4+ VIEW COMPARISON:  03/14/2016, MRI 03/22/2016 FINDINGS: Suspected transitional anatomy. For the purposes of reporting, last well-formed vertebra will be designated L5. Lumbar alignment within normal limits. Moderate diffuse degenerative changes with disc space narrowing and prominent osteophytes. Disc disease is worst at L1-L2 and L2-L3. Vertebral body heights are normal. Prominent posterior facet degenerative changes of the lower lumbar spine. IMPRESSION: Moderate degenerative changes without acute osseous abnormality Electronically Signed   By: Donavan Foil M.D.   On: 09/22/2017 22:47   Dg Shoulder Left  Result Date: 09/22/2017 CLINICAL DATA:  Shoulder pain EXAM: LEFT SHOULDER - 2+ VIEW COMPARISON:  12/16/2016 FINDINGS: Moderate AC joint degenerative change and glenohumeral degenerative change. Old left-sided rib fractures. No fracture or malalignment. IMPRESSION: Moderate degenerative changes of the left shoulder. No acute osseous abnormality Electronically Signed   By: Donavan Foil M.D.   On: 09/22/2017 22:43   Dg Hip Unilat W Or Wo Pelvis 2-3 Views Left  Result Date: 09/22/2017 CLINICAL DATA:  Hip pain EXAM: DG HIP (WITH OR WITHOUT PELVIS) 2-3V LEFT COMPARISON:  CT 12/16/2016 FINDINGS: SI joints are patent. Pubic symphysis and rami are intact. Both femoral heads project in joint. No fracture or malalignment. Mild degenerative changes of both hips. Vascular calcifications. IMPRESSION: Mild arthritis of the hips.  No acute osseous abnormality Electronically Signed   By: Donavan Foil M.D.   On: 09/22/2017 22:48    Patient seen by me along with the physician assistant.   Patient has had the complaint of low back pain and neck pain for years.  Is been worse the past 3 months.  Patient is followed by pain management.  No significant neurological changes.  Or focal findings that are new.  X-rays here without any significant findings.  Patient pain here treated with intramuscular pain medicine with significant improvement.  Patient has pain medications at home that can be adjusted.  Patient is switching pain management services and does have somebody to follow-up with.  Patient currently feels much better nontoxic no acute distress.   Fredia Sorrow, MD 09/23/17 [phone]

## 2017-09-25 NOTE — Progress Notes (Signed)
HPI:   [name] [name] is a 77 y.o. male, who is here today with his sister to follow on recent ER visit.   He was seen on 09/22/17, presented to the ER c/o "pain all over", mainly shoulder pain,back, and neck pain.  Cervical spine X ray:  Inadequate visualization C7 and below. Straightening of the cervical spine with plate and fixating screws at C4-C5. Moderate severe diffuse degenerative change of the cervical spine.  Left shoulder X ray:  Moderate degenerative changes of the left shoulder. No acute osseous abnormality  Lumbar spine:  Moderate degenerative changes without acute osseous abnormality  Hip/pelvic X ray:  Moderate degenerative changes without acute osseous abnormality  He has history of chronic pain, generalized OA, cervicalgia, and lower back pain with radiculopathy. Pain is constant. Limitation of range of motion and feeling "unbalance."   Pain is exacerbated by certain activities.  His job entails crawling under houses, which aggravates pain. Pain is alleviated by rest.  He was following with pain management but due to insurance changes provided he was seen is not longer in his network. He is supposed to start seeing another provider in [place], appointment will be arranged after records are received.  He is not taking OTC medication.  He is also complaining of increasing urinary frequency for the past couple weeks, he is urinating every 3 to 5 minutes. Nocturia every hour. + Urinary urgency with incontinence, these are chronic and stable.  No abdominal pain or changes in bowel habits. He denies fever, chills, changes in appetite, dysuria, gross hematuria, urethral discharge, or rectal pain. He has not tried OTC medications.  Smoker, he has smoked since age 36. He states that he always smoked less than 1 PPD. Occasionally he coughs but denies wheezing or dyspnea.   Review of Systems  Constitutional: Positive for activity  change and fatigue. Negative for appetite change and fever.  HENT: Negative for nosebleeds, sore throat and trouble swallowing.   Respiratory: Positive for cough. Negative for shortness of breath and wheezing.   Cardiovascular: Negative for chest pain, palpitations and leg swelling.  Gastrointestinal: Negative for abdominal pain, nausea and vomiting.       No changes in bowel habits.  Genitourinary: Positive for frequency. Negative for decreased urine volume, dysuria and hematuria.  Musculoskeletal: Positive for arthralgias, back pain, gait problem and neck pain. Negative for joint swelling.  Skin: Negative for pallor and rash.  Neurological: Negative for weakness and headaches.  Psychiatric/Behavioral: Negative for confusion. The patient is nervous/anxious.       Current Outpatient Medications on File Prior to Visit  Medication Sig Dispense Refill  . Oxycodone HCl 10 MG TABS Take 10 mg by mouth daily.     No current facility-administered medications on file prior to visit.      Past Medical History:  Diagnosis Date  . Back pain   . Renal disorder    kidney stones  . Spinal stenosis, lumbar region, with neurogenic claudication 07/07/2014   Allergies  Allergen Reactions  . No Known Allergies     Social History   Socioeconomic History  . Marital status: Widowed    Spouse name: Not on file  . Number of children: Not on file  . Years of education: Not on file  . Highest education level: Not on file  Occupational History  . Not on file  Social Needs  . Financial resource strain: Not on file  . Food insecurity:    Worry:  Not on file    Inability: Not on file  . Transportation needs:    Medical: Not on file    Non-medical: Not on file  Tobacco Use  . Smoking status: Current Every Day Smoker    Packs/day: 0.75    Types: Cigarettes  . Smokeless tobacco: Never Used  Substance and Sexual Activity  . Alcohol use: No  . Drug use: No  . Sexual activity: Not on file    Lifestyle  . Physical activity:    Days per week: Not on file    Minutes per session: Not on file  . Stress: Not on file  Relationships  . Social connections:    Talks on phone: Not on file    Gets together: Not on file    Attends religious service: Not on file    Active member of club or organization: Not on file    Attends meetings of clubs or organizations: Not on file    Relationship status: Not on file  Other Topics Concern  . Not on file  Social History Narrative  . Not on file    Vitals:   09/26/17 1003  BP: 140/84  Pulse: 60  Resp: 16  Temp: 98 F (36.7 C)  SpO2: 100%   Body mass index is 23.51 kg/m.   Physical Exam  Nursing note reviewed. Constitutional: He is oriented to person, place, and time. He appears well-developed and well-nourished. No distress.  HENT:  Head: Normocephalic and atraumatic.  Mouth/Throat: Oropharynx is clear and moist and mucous membranes are normal. Abnormal dentition.  Eyes: Pupils are equal, round, and reactive to light. Conjunctivae are normal.  Cardiovascular: Normal rate and regular rhythm.  No murmur heard. DP and PT pulses present bilateral.  Respiratory: Effort normal and breath sounds normal. No respiratory distress.  GI: Soft. He exhibits no mass. There is no hepatomegaly. There is no tenderness.  Genitourinary:  Genitourinary Comments: Refused rectal exam.  Musculoskeletal: He exhibits edema (periankle edema, pitting,bilateral).       Cervical back: He exhibits decreased range of motion and tenderness. He exhibits no bony tenderness.       Thoracic back: He exhibits tenderness. He exhibits no bony tenderness.       Back:  Limitation of passive and active ROM of shoulder, movement elicits pain. No signs of synovitis. Antalgic gait with no assistance.  Lymphadenopathy:    He has no cervical adenopathy.  Neurological: He is alert and oriented to person, place, and time. He has normal strength.  Mildly unstable gait.   Skin: Skin is warm. No rash noted. No erythema.  Psychiatric: His mood appears anxious. Cognition and memory are normal.  Well groomed, good eye contact.    ASSESSMENT AND PLAN:  [name] [name] was seen today for hospitalization follow-up.  Orders Placed This Encounter  Procedures  . Basic metabolic panel  . Urinalysis, Routine w reflex microscopic  . PSA  . POCT glycosylated hemoglobin (Hb A1C)    Lab Results  Component Value Date   HGBA1C 5.4 09/26/2017   Lab Results  Component Value Date   PSA 0.59 09/26/2017   Lab Results  Component Value Date   CREATININE 1.14 09/26/2017   BUN 12 09/26/2017   NA 139 09/26/2017   K 3.7 09/26/2017   CL 102 09/26/2017   CO2 30 09/26/2017    Urinary frequency  Possible etiology discussed. Most likely related to BPH. No symptoms to suggest prostatitis. Further recommendation will be given according to  lab results.  DDD (degenerative disc disease), cervical Chronic. Range of motion exercises may help. Pending appointment with new pain management provider.  DDD (degenerative disc disease), lumbar With radicular pain of lower extremities. He has refused trying Cymbalta in the past.  He tried gabapentin, discontinued and refused trying again.  He does not recall side effects.  He has received epidural injection in the past, which has helped. Continue following with ortho (Dr Marlou Sa) and pending appointment with pain management.  Osteoarthritis of multiple joints Educated about diagnosis. Recommend avoiding activities that could exacerbate pain.   Tobacco use disorder After discussion of adverse effects as well as some benefits of smoking cessation, he would like to try. Recommend trying nicotine 14 mg daily, we can titrate down to 70 mg daily in 4 to 5 weeks.   BPH associated with nocturia Educated about BPH, history of pathology and treatment options. Today he refused rectal exam. He would like to try Flomax 0.4 mg  daily. Further recommendations will be given according to lab results. Follow-up in 3 months.      Geza Beranek G. [name], MD  [facility]. [place] office.

## 2017-09-26 ENCOUNTER — Ambulatory Visit (INDEPENDENT_AMBULATORY_CARE_PROVIDER_SITE_OTHER): Payer: Medicare HMO | Admitting: Family Medicine

## 2017-09-26 ENCOUNTER — Encounter: Payer: Self-pay | Admitting: Family Medicine

## 2017-09-26 VITALS — BP 140/84 | HR 60 | Temp 98.0°F | Resp 16 | Ht 67.0 in | Wt 150.1 lb

## 2017-09-26 DIAGNOSIS — R69 Illness, unspecified: Secondary | ICD-10-CM | POA: Diagnosis not present

## 2017-09-26 DIAGNOSIS — F17200 Nicotine dependence, unspecified, uncomplicated: Secondary | ICD-10-CM | POA: Diagnosis not present

## 2017-09-26 DIAGNOSIS — R351 Nocturia: Secondary | ICD-10-CM

## 2017-09-26 DIAGNOSIS — R350 Frequency of micturition: Secondary | ICD-10-CM

## 2017-09-26 DIAGNOSIS — M5136 Other intervertebral disc degeneration, lumbar region: Secondary | ICD-10-CM | POA: Diagnosis not present

## 2017-09-26 DIAGNOSIS — N401 Enlarged prostate with lower urinary tract symptoms: Secondary | ICD-10-CM

## 2017-09-26 DIAGNOSIS — M5030 Other cervical disc degeneration, unspecified cervical region: Secondary | ICD-10-CM | POA: Diagnosis not present

## 2017-09-26 DIAGNOSIS — M159 Polyosteoarthritis, unspecified: Secondary | ICD-10-CM | POA: Diagnosis not present

## 2017-09-26 LAB — URINALYSIS, ROUTINE W REFLEX MICROSCOPIC
Bilirubin Urine: NEGATIVE
Hgb urine dipstick: NEGATIVE
Ketones, ur: NEGATIVE
Leukocytes, UA: NEGATIVE
Nitrite: NEGATIVE
Specific Gravity, Urine: 1.01 (ref 1.000–1.030)
Total Protein, Urine: NEGATIVE
Urine Glucose: NEGATIVE
Urobilinogen, UA: 0.2 (ref 0.0–1.0)
WBC, UA: NONE SEEN — AB (ref 0–?)
pH: 7 (ref 5.0–8.0)

## 2017-09-26 LAB — POCT GLYCOSYLATED HEMOGLOBIN (HGB A1C): Hemoglobin A1C: 5.4 % (ref 4.0–5.6)

## 2017-09-26 LAB — PSA: PSA: 0.59 ng/mL (ref 0.10–4.00)

## 2017-09-26 LAB — BASIC METABOLIC PANEL
BUN: 12 mg/dL (ref 6–23)
CO2: 30 mEq/L (ref 19–32)
Calcium: 9.2 mg/dL (ref 8.4–10.5)
Chloride: 102 mEq/L (ref 96–112)
Creatinine, Ser: 1.14 mg/dL (ref 0.40–1.50)
GFR: 80.28 mL/min (ref >60.00)
Glucose, Bld: 95 mg/dL (ref 70–99)
Potassium: 3.7 mEq/L (ref 3.5–5.1)
Sodium: 139 mEq/L (ref 135–145)

## 2017-09-26 MED ORDER — NICOTINE 14 MG/24HR TD PT24: 14.0000 mg | MEDICATED_PATCH | Freq: Every day | TRANSDERMAL | 0 refills | Status: DC

## 2017-09-26 MED ORDER — TAMSULOSIN HCL 0.4 MG PO CAPS: 0.4000 mg | ORAL_CAPSULE | Freq: Every day | ORAL | 3 refills | Status: DC

## 2017-09-26 NOTE — Assessment & Plan Note (Signed)
Educated about BPH, history of pathology and treatment options. Today he refused rectal exam. He would like to try Flomax 0.4 mg daily. Further recommendations will be given according to lab results. Follow-up in 3 months.

## 2017-09-26 NOTE — Assessment & Plan Note (Signed)
Chronic. Range of motion exercises may help. Pending appointment with new pain management provider.

## 2017-09-26 NOTE — Patient Instructions (Addendum)
A few things to remember from today's visit:   Urinary frequency - Plan: Basic metabolic panel, POCT glycosylated hemoglobin (Hb A1C), Urinalysis, Routine w reflex microscopic  BPH associated with nocturia - Plan: PSA, tamsulosin (FLOMAX) 0.4 MG CAPS capsule  DDD (degenerative disc disease), cervical  Tobacco use disorder - Plan: nicotine (NICODERM CQ - DOSED IN MG/24 HOURS) 14 mg/24hr patch, Ambulatory Referral for Lung Cancer Scre  Osteoarthritis of multiple joints, unspecified osteoarthritis type  I would like for you you to schedule a Medicare Annual Wellness Visit (AWV).   This is a yearly appointment with our Health Coach Wynetta Fines, RN) and is designed to develop a personalized prevention plan. This is not a head to toe physical, but rather an opportunity to prevent illness based on your current health and risk factors for disease.   Visits usually last 30-60 minutes and include various screenings for hearing, vision, depression, and dementia, falls, and safety concerns. The visit also includes diet and exercise counseling and information about advance directives.   This is also an opportunity to discuss appropriate health maintenance testing such as mammography, colonoscopy, lung cancer screening, and hepatitis C testing.   The AWV is fully covered by Medicare Part B if:  . You have had Part B for over 12 months, AND . You have not had an AWV in the past 12 months .  Please don't miss out on this opportunity! Set up your appointment today!   Please be sure medication list is accurate. If a new problem present, please set up appointment sooner than planned today.

## 2017-09-26 NOTE — Assessment & Plan Note (Signed)
Educated about diagnosis. Recommend avoiding activities that could exacerbate pain.

## 2017-09-26 NOTE — Assessment & Plan Note (Signed)
After discussion of adverse effects as well as some benefits of smoking cessation, he would like to try. Recommend trying nicotine 14 mg daily, we can titrate down to 70 mg daily in 4 to 5 weeks.

## 2017-09-26 NOTE — Assessment & Plan Note (Addendum)
With radicular pain of lower extremities. He has refused trying Cymbalta in the past.  He tried gabapentin, discontinued and refused trying again.  He does not recall side effects.  He has received epidural injection in the past, which has helped. Continue following with ortho (Dr Marlou Sa) and pending appointment with pain management.

## 2017-09-27 DIAGNOSIS — I739 Peripheral vascular disease, unspecified: Secondary | ICD-10-CM | POA: Diagnosis not present

## 2017-09-27 DIAGNOSIS — R69 Illness, unspecified: Secondary | ICD-10-CM | POA: Diagnosis not present

## 2017-09-27 DIAGNOSIS — G8929 Other chronic pain: Secondary | ICD-10-CM | POA: Diagnosis not present

## 2017-09-27 DIAGNOSIS — Z8249 Family history of ischemic heart disease and other diseases of the circulatory system: Secondary | ICD-10-CM | POA: Diagnosis not present

## 2017-09-27 DIAGNOSIS — N400 Enlarged prostate without lower urinary tract symptoms: Secondary | ICD-10-CM | POA: Diagnosis not present

## 2017-09-27 DIAGNOSIS — Z9181 History of falling: Secondary | ICD-10-CM | POA: Diagnosis not present

## 2017-09-27 DIAGNOSIS — H40059 Ocular hypertension, unspecified eye: Secondary | ICD-10-CM | POA: Diagnosis not present

## 2017-09-27 DIAGNOSIS — H409 Unspecified glaucoma: Secondary | ICD-10-CM | POA: Diagnosis not present

## 2017-09-27 DIAGNOSIS — M1990 Unspecified osteoarthritis, unspecified site: Secondary | ICD-10-CM | POA: Diagnosis not present

## 2017-09-27 DIAGNOSIS — Z79891 Long term (current) use of opiate analgesic: Secondary | ICD-10-CM | POA: Diagnosis not present

## 2017-09-28 ENCOUNTER — Emergency Department
Admission: EM | Admit: 2017-09-28 | Discharge: 2017-09-28 | Disposition: A | Discharge status: Home / Self Care | Payer: Medicare HMO | Attending: Emergency Medicine | Admitting: Emergency Medicine

## 2017-09-28 ENCOUNTER — Other Ambulatory Visit: Payer: Self-pay

## 2017-09-28 ENCOUNTER — Encounter: Payer: Self-pay | Admitting: *Deleted

## 2017-09-28 DIAGNOSIS — G8929 Other chronic pain: Secondary | ICD-10-CM | POA: Insufficient documentation

## 2017-09-28 DIAGNOSIS — M25512 Pain in left shoulder: Secondary | ICD-10-CM | POA: Insufficient documentation

## 2017-09-28 DIAGNOSIS — M549 Dorsalgia, unspecified: Secondary | ICD-10-CM | POA: Diagnosis not present

## 2017-09-28 DIAGNOSIS — F17210 Nicotine dependence, cigarettes, uncomplicated: Secondary | ICD-10-CM | POA: Insufficient documentation

## 2017-09-28 DIAGNOSIS — R52 Pain, unspecified: Secondary | ICD-10-CM | POA: Diagnosis present

## 2017-09-28 DIAGNOSIS — M2550 Pain in unspecified joint: Secondary | ICD-10-CM | POA: Diagnosis not present

## 2017-09-28 DIAGNOSIS — R69 Illness, unspecified: Secondary | ICD-10-CM | POA: Diagnosis not present

## 2017-09-28 DIAGNOSIS — M6281 Muscle weakness (generalized): Secondary | ICD-10-CM | POA: Diagnosis not present

## 2017-09-28 DIAGNOSIS — M542 Cervicalgia: Secondary | ICD-10-CM | POA: Diagnosis not present

## 2017-09-28 LAB — CBC
HCT: 43.1 % (ref 40.0–52.0)
Hemoglobin: 14.2 g/dL (ref 13.0–18.0)
MCH: 26.9 pg (ref 26.0–34.0)
MCHC: 33 g/dL (ref 32.0–36.0)
MCV: 81.6 fL (ref 80.0–100.0)
Platelets: 180 10*3/uL (ref 150–440)
RBC: 5.29 MIL/uL (ref 4.40–5.90)
RDW: 14.9 % — ABNORMAL HIGH (ref 11.5–14.5)
WBC: 10.3 10*3/uL (ref 3.8–10.6)

## 2017-09-28 LAB — BASIC METABOLIC PANEL
Anion gap: 5 (ref 5–15)
BUN: 13 mg/dL (ref 8–23)
CO2: 30 mmol/L (ref 22–32)
Calcium: 8.7 mg/dL — ABNORMAL LOW (ref 8.9–10.3)
Chloride: 107 mmol/L (ref 98–111)
Creatinine, Ser: 1.22 mg/dL (ref 0.61–1.24)
GFR calc Af Amer: >60 mL/min (ref >60)
GFR calc non Af Amer: 56 mL/min — ABNORMAL LOW (ref >60)
Glucose, Bld: 87 mg/dL (ref 70–99)
Potassium: 3.4 mmol/L — ABNORMAL LOW (ref 3.5–5.1)
Sodium: 142 mmol/L (ref 135–145)

## 2017-09-28 MED ORDER — MORPHINE SULFATE (PF) 4 MG/ML IV SOLN
4.0000 mg | Freq: Once | INTRAVENOUS | Status: AC
  Administered 2017-09-28: 4 mg via INTRAMUSCULAR
  Filled 2017-09-28: qty 1

## 2017-09-28 MED ORDER — MORPHINE SULFATE (PF) 4 MG/ML IV SOLN: 4.0000 mg | Freq: Once | INTRAVENOUS | Status: DC

## 2017-09-28 NOTE — ED Provider Notes (Signed)
[facility] Emergency Department Provider Note  Time seen: 6:21 PM  I have reviewed the triage vital signs and the nursing notes.   HISTORY  Chief Complaint Joint Pain    HPI [name] [name] is a 77 y.o. male with a past medical history of degenerative disc disease chronic pain, presents to the emergency department for pain.  According to the patient he states he was outside mowing the lawn and he felt like his joints locked up.  States they have had increased pain over the past 1 week and lock up at times.  Patient seen by pain management takes oxycodone at home for his discomfort.  Patient is not sure if he overdid it or got dehydrated today to cause this to happen.  Continues to state pain in his back and left shoulder which he admits is chronic.  Specifically denies any chest pain or abdominal pain, largely negative review of systems otherwise.   Past Medical History:  Diagnosis Date  . Back pain   . Renal disorder    kidney stones  . Spinal stenosis, lumbar region, with neurogenic claudication 07/07/2014    Patient Active Problem List   Diagnosis Date Noted  . BPH associated with nocturia 09/26/2017  . Allergic rhinitis 06/09/2017  . Tobacco use disorder 02/14/2017  . Osteoarthritis of multiple joints 02/14/2017  . Bilateral lower extremity edema 08/12/2016  . Chronic right-sided low back pain with right-sided sciatica 03/14/2016  . Chronic pain of right knee 03/14/2016  . DDD (degenerative disc disease), lumbar 07/07/2014  . DDD (degenerative disc disease), cervical 07/07/2014  . DJD (degenerative joint disease) of knee 07/07/2014  . Bilateral occipital neuralgia 07/07/2014  . Spinal stenosis, lumbar region, with neurogenic claudication 07/07/2014    Past Surgical History:  Procedure Laterality Date  . CATARACT EXTRACTION W/PHACO Left 08/22/2016   Procedure: CATARACT EXTRACTION PHACO AND INTRAOCULAR LENS PLACEMENT ([place]) Left;  Surgeon:  [name], MD;  Location: [facility];  Service: Ophthalmology;  Laterality: Left;  . CATARACT EXTRACTION W/PHACO Right 10/25/2016   Procedure: CATARACT EXTRACTION PHACO AND INTRAOCULAR LENS PLACEMENT ([place]) WITH ISTENT RIGHT  ;  Surgeon: [name], MD;  Location: [facility];  Service: Ophthalmology;  Laterality: Right;  TOPICAL RIGHT  ISTENT   trabecular bypass stent was implanted/explanted.  . INGUINAL HERNIA REPAIR    . KNEE SURGERY    . NECK SURGERY      Prior to Admission medications   Medication Sig Start Date End Date Taking? Authorizing Provider  nicotine (NICODERM CQ - DOSED IN MG/24 HOURS) 14 mg/24hr patch Place 1 patch (14 mg total) onto the skin daily. 09/26/17   [name], [name], MD  Oxycodone HCl 10 MG TABS Take 10 mg by mouth daily.    [provider]  tamsulosin (FLOMAX) 0.4 MG CAPS capsule Take 1 capsule (0.4 mg total) by mouth daily. 09/26/17   [name], [name], MD    Allergies  Allergen Reactions  . No Known Allergies     Family History  Problem Relation Age of Onset  . Diabetes Mother   . Diabetes Brother   . Diabetes Sister     Social History Social History   Tobacco Use  . Smoking status: Current Every Day Smoker    Packs/day: 0.75    Types: Cigarettes  . Smokeless tobacco: Never Used  Substance Use Topics  . Alcohol use: No  . Drug use: No    Review of Systems Constitutional:  Negative for fever. Cardiovascular: Negative for chest pain. Respiratory: Negative for shortness of breath. Gastrointestinal: Negative for abdominal pain, vomiting and diarrhea. Genitourinary: Negative for urinary compaints Musculoskeletal: Back pain neck pain pain in his joints in his arms and his legs. Skin: Negative for skin complaints  Neurological: Negative for headache All other ROS negative  ____________________________________________   PHYSICAL EXAM:  VITAL SIGNS: ED Triage Vitals  Enc Vitals Group     BP 09/28/17  1731 131/66     Pulse Rate 09/28/17 1731 63     Resp 09/28/17 1731 18     Temp 09/28/17 1731 98.6 F (37 C)     Temp Source 09/28/17 1731 Oral     SpO2 09/28/17 1731 100 %     Weight 09/28/17 1732 150 lb (68 kg)     Height 09/28/17 1732 5\' 7"  (1.702 m)     Head Circumference --      Peak Flow --      Pain Score 09/28/17 1732 8     Pain Loc --      Pain Edu? --      Excl. in [place]? --    Constitutional: Alert and oriented. Well appearing and in no distress. Eyes: Normal exam ENT   Head: Normocephalic and atraumatic.   Mouth/Throat: Mucous membranes are moist. Cardiovascular: Normal rate, regular rhythm. No murmur Respiratory: Normal respiratory effort without tachypnea nor retractions. Breath sounds are clear  Gastrointestinal: Soft and nontender. No distention.  Musculoskeletal: Nontender with normal range of motion in all extremities.  Good range of motion in his joints but does state pain in his left shoulder neck and back. Neurologic:  Normal speech and language. No gross focal neurologic deficits Skin:  Skin is warm, dry and intact.  Psychiatric: Mood and affect are normal.  ____________________________________________   INITIAL IMPRESSION / ASSESSMENT AND PLAN / ED COURSE  Pertinent labs & imaging results that were available during my care of the patient were reviewed by me and considered in my medical decision making (see chart for details).  Patient presents the emergency department for increased pain in his joints "locking up."  Overall the patient appears very well, reviewed the patient's records he was recently seen 09/22/2017 had x-rays performed showing significant arthritis/degenerative joint disease, but no acute abnormality.  Denies any falls.  We will treat with a shot of morphine for pain control.  Patient's labs are otherwise reassuring.  Has pain management follow-up.  ____________________________________________   FINAL CLINICAL IMPRESSION(S) / ED  DIAGNOSES  Joint pain    Harvest Dark, MD 09/28/17 8891

## 2017-09-28 NOTE — ED Triage Notes (Signed)
Pt brought in via ems from home with joint pain for 1 week.  Pt taking pain meds without relief.  Pt ws seen at [facility] last week with similar sx.  Pt alert  Speech clear.

## 2017-09-28 NOTE — ED Notes (Signed)
Pt presents to ED via EMS with c/o bilateral shoulder pain, and bilateral knee to ankle pain. Pt states was out working in the yard when he got "over heated". Pt states he is unable to fully extend both arms at this time. Pt is alert and oriented at this time, shuffling gait noted when crossing from wheelchair to bed.

## 2017-09-29 DIAGNOSIS — H401132 Primary open-angle glaucoma, bilateral, moderate stage: Secondary | ICD-10-CM | POA: Diagnosis not present

## 2017-10-03 ENCOUNTER — Inpatient Hospital Stay: Payer: Medicare HMO | Admitting: Family Medicine

## 2017-11-13 DIAGNOSIS — G629 Polyneuropathy, unspecified: Secondary | ICD-10-CM | POA: Diagnosis not present

## 2017-11-13 DIAGNOSIS — Z5181 Encounter for therapeutic drug level monitoring: Secondary | ICD-10-CM | POA: Diagnosis not present

## 2017-11-13 DIAGNOSIS — M5030 Other cervical disc degeneration, unspecified cervical region: Secondary | ICD-10-CM | POA: Diagnosis not present

## 2017-11-13 DIAGNOSIS — I739 Peripheral vascular disease, unspecified: Secondary | ICD-10-CM | POA: Diagnosis not present

## 2017-11-17 ENCOUNTER — Ambulatory Visit (INDEPENDENT_AMBULATORY_CARE_PROVIDER_SITE_OTHER): Payer: Medicare HMO | Admitting: Family Medicine

## 2017-11-17 ENCOUNTER — Encounter: Payer: Self-pay | Admitting: Family Medicine

## 2017-11-17 ENCOUNTER — Other Ambulatory Visit: Payer: Self-pay | Admitting: Family Medicine

## 2017-11-17 VITALS — BP 130/82 | HR 60 | Temp 97.9°F | Resp 16 | Ht 67.0 in | Wt 144.2 lb

## 2017-11-17 DIAGNOSIS — R2681 Unsteadiness on feet: Secondary | ICD-10-CM | POA: Insufficient documentation

## 2017-11-17 DIAGNOSIS — G894 Chronic pain syndrome: Secondary | ICD-10-CM | POA: Diagnosis not present

## 2017-11-17 DIAGNOSIS — M159 Polyosteoarthritis, unspecified: Secondary | ICD-10-CM

## 2017-11-17 DIAGNOSIS — R0989 Other specified symptoms and signs involving the circulatory and respiratory systems: Secondary | ICD-10-CM | POA: Diagnosis not present

## 2017-11-17 MED ORDER — DULOXETINE HCL 30 MG PO CPEP: 30.0000 mg | ORAL_CAPSULE | Freq: Every day | ORAL | 1 refills | Status: DC

## 2017-11-17 NOTE — Assessment & Plan Note (Signed)
Some side effects of oxycodone were discussed. Appointment with a different pain clinic/provider will be arranged since Dr. Primus Bravo is not longer in his network.

## 2017-11-17 NOTE — Assessment & Plan Note (Signed)
We discussed diagnosis, prognosis, and treatment options. After discussion of some side effects, he agrees with trying Cymbalta 30 mg. He will continue oxycodone 10 mg twice daily as needed. Follow-up in 4 to 6 weeks.

## 2017-11-17 NOTE — Progress Notes (Signed)
ACUTE VISIT   HPI:  Chief Complaint  Patient presents with  . Follow-up    bilateral leg pain, back and shoulder pain    [name] [name] is a 77 y.o. male, who is here today complaining of LE and shoulders pain.  Problems are chronic but pain is getting worse. He has followed with Dr Primus Bravo for pain management.According to pt he wants "information to check to go with specialists" He has not seen Dr Primus Bravo in 3-4 months because is not under his insurance now. Next appt 12/06/17.  Bilateral shoulder pain getting worse,interferring with driving and cannot raise arm. Exacerbated by movement. Limitation of ROM. No edema or erythema. Severe pain.  Lower back pain radiated to LE's, burning tingling and numbness sensation, "bad". This is constant now. Interferes with sleep. I recommended Gabapentin before but he discontinued because drowsiness. States that now he is willing to try something to help with pain. He is currently on Oxycodone 10 mg bid.He does not like to take med because he cannot function, feels sleepy.  Denies saddle anesthesia or changes in urine/bowel continence. No fever,chills,or skin rash.  Pain is worse in the morning, stiffness.  He is also concerned about weight loss. Cannot prepare meals and has difficulty with ADL's die to pain. He drives.  Also decreased appetite. He has a son but he does "not come around." He lives alone. Denies recent falls.   Review of Systems  Constitutional: Positive for activity change, appetite change and fatigue. Negative for fever.  HENT: Negative for mouth sores, sore throat and trouble swallowing.   Eyes: Negative for redness and visual disturbance.  Respiratory: Positive for cough (occasional). Negative for shortness of breath and wheezing.   Cardiovascular: Positive for leg swelling (intermittent,improved.). Negative for chest pain and palpitations.  Gastrointestinal: Negative for abdominal pain,  nausea and vomiting.  Genitourinary: Negative for decreased urine volume, dysuria and hematuria.  Musculoskeletal: Positive for arthralgias, back pain and gait problem.  Neurological: Positive for numbness. Negative for weakness and headaches.      Current Outpatient Medications on File Prior to Visit  Medication Sig Dispense Refill  . nicotine (NICODERM CQ - DOSED IN MG/24 HOURS) 14 mg/24hr patch Place 1 patch (14 mg total) onto the skin daily. 28 patch 0  . Oxycodone HCl 10 MG TABS Take 10 mg by mouth daily.    . tamsulosin (FLOMAX) 0.4 MG CAPS capsule Take 1 capsule (0.4 mg total) by mouth daily. 30 capsule 3   No current facility-administered medications on file prior to visit.      Past Medical History:  Diagnosis Date  . Back pain   . Renal disorder    kidney stones  . Spinal stenosis, lumbar region, with neurogenic claudication 07/07/2014   Allergies  Allergen Reactions  . No Known Allergies     Social History   Socioeconomic History  . Marital status: Widowed    Spouse name: Not on file  . Number of children: Not on file  . Years of education: Not on file  . Highest education level: Not on file  Occupational History  . Not on file  Social Needs  . Financial resource strain: Not on file  . Food insecurity:    Worry: Not on file    Inability: Not on file  . Transportation needs:    Medical: Not on file    Non-medical: Not on file  Tobacco Use  . Smoking status: Current Every Day Smoker  Packs/day: 0.75    Types: Cigarettes  . Smokeless tobacco: Never Used  Substance and Sexual Activity  . Alcohol use: No  . Drug use: No  . Sexual activity: Not on file  Lifestyle  . Physical activity:    Days per week: Not on file    Minutes per session: Not on file  . Stress: Not on file  Relationships  . Social connections:    Talks on phone: Not on file    Gets together: Not on file    Attends religious service: Not on file    Active member of club or  organization: Not on file    Attends meetings of clubs or organizations: Not on file    Relationship status: Not on file  Other Topics Concern  . Not on file  Social History Narrative  . Not on file    Vitals:   11/17/17 1019  BP: 130/82  Pulse: 60  Resp: 16  Temp: 97.9 F (36.6 C)  SpO2: 96%   Body mass index is 22.59 kg/m.  Wt Readings from Last 3 Encounters:  11/17/17 144 lb 4 oz (65.4 kg)  09/28/17 150 lb (68 kg)  09/26/17 150 lb 2 oz (68.1 kg)    Physical Exam  Nursing note and vitals reviewed. Constitutional: He is oriented to person, place, and time. He appears well-developed and well-nourished. No distress.  HENT:  Head: Normocephalic and atraumatic.  Mouth/Throat: Oropharynx is clear and moist and mucous membranes are normal.  Eyes: Conjunctivae are normal.  Sluggish pupil response to light and arcus seniles, bilateral.   Cardiovascular: Normal rate and regular rhythm.  No murmur heard. Pulses:      Posterior tibial pulses are 1+ on the left side.  Respiratory: Effort normal and breath sounds normal. No respiratory distress.  GI: Soft. He exhibits no mass. There is no hepatomegaly. There is no tenderness.  Musculoskeletal: He exhibits no edema.       Right shoulder: He exhibits decreased range of motion and tenderness.       Left shoulder: He exhibits decreased range of motion and tenderness.       Right knee: He exhibits decreased range of motion. He exhibits no erythema.       Left knee: He exhibits decreased range of motion. He exhibits no erythema.       Thoracic back: He exhibits tenderness. He exhibits no bony tenderness.       Lumbar back: He exhibits tenderness. He exhibits no bony tenderness.       Back:  Tenderness upon palpation of shoulder, limitation of active range of motion, about 125 degrees.  Movement elicits pain  Lymphadenopathy:    He has no cervical adenopathy.  Neurological: He is alert and oriented to person, place, and time. He  has normal strength. Gait abnormal.  Unstable gait, he has no assistance.  Skin: Skin is warm. No rash noted. No erythema.  Psychiatric: His mood appears anxious. Cognition and memory are normal.  Well groomed, good eye contact.      ASSESSMENT AND PLAN:   [name] [name] was seen today for follow-up.  Diagnoses and all orders for this visit:  Decreased pulses in feet  Strongly recommend smoking cessation. I was able to find left PT pulse.  Instructed about warning signs. Further recommendations according to ABI results.  -     VAS [place] ABI WITH/WO TBI; Future   Generalized osteoarthritis of multiple sites We discussed diagnosis, prognosis, and treatment options.  After discussion of some side effects, he agrees with trying Cymbalta 30 mg. He will continue oxycodone 10 mg twice daily as needed. Follow-up in 4 to 6 weeks.  Chronic pain disorder Some side effects of oxycodone were discussed. Appointment with a different pain clinic/provider will be arranged since Dr. Primus Bravo is not longer in his network.  Unstable gait Gait is very unstable. We discussed risk factors for falls and fall precautions. Some of his medical problems as well as medications increase his risk for falls. Recommend using a cane.       Return in about 6 weeks (around 12/29/2017).       Bridey Brookover G. [name], MD  [facility]. [place] office.

## 2017-11-17 NOTE — Patient Instructions (Addendum)
A few things to remember from today's visit:   Decreased pulses in feet - Plan: VAS [place] ABI WITH/WO TBI  Chronic pain disorder - Plan: Ambulatory referral to Pain Clinic, DULoxetine (CYMBALTA) 30 MG capsule, Ambulatory referral to [place]  Generalized osteoarthritis of multiple sites - Plan: DULoxetine (CYMBALTA) 30 MG capsule, Ambulatory referral to Home Health  Unstable gait - Plan: Ambulatory referral to [place] 30 mg started today. Tylenol 500 mg 3-4 times per day. Tumeric with black pepper 2 times daily.  Fall prevention.  Please be sure medication list is accurate. If a new problem present, please set up appointment sooner than planned today.

## 2017-11-17 NOTE — Assessment & Plan Note (Signed)
Gait is very unstable. We discussed risk factors for falls and fall precautions. Some of his medical problems as well as medications increase his risk for falls. Recommend using a cane.

## 2017-11-21 ENCOUNTER — Ambulatory Visit: Payer: Medicare HMO | Admitting: Family Medicine

## 2017-11-22 ENCOUNTER — Emergency Department: Payer: Medicare HMO

## 2017-11-22 ENCOUNTER — Other Ambulatory Visit: Payer: Self-pay

## 2017-11-22 ENCOUNTER — Encounter: Payer: Self-pay | Admitting: Emergency Medicine

## 2017-11-22 ENCOUNTER — Emergency Department
Admission: EM | Admit: 2017-11-22 | Discharge: 2017-11-22 | Disposition: A | Discharge status: Home / Self Care | Payer: Medicare HMO | Attending: Emergency Medicine | Admitting: Emergency Medicine

## 2017-11-22 DIAGNOSIS — R42 Dizziness and giddiness: Secondary | ICD-10-CM | POA: Diagnosis not present

## 2017-11-22 DIAGNOSIS — R531 Weakness: Secondary | ICD-10-CM | POA: Insufficient documentation

## 2017-11-22 DIAGNOSIS — I10 Essential (primary) hypertension: Secondary | ICD-10-CM | POA: Diagnosis not present

## 2017-11-22 DIAGNOSIS — R112 Nausea with vomiting, unspecified: Secondary | ICD-10-CM | POA: Insufficient documentation

## 2017-11-22 DIAGNOSIS — K769 Liver disease, unspecified: Secondary | ICD-10-CM | POA: Diagnosis not present

## 2017-11-22 DIAGNOSIS — R69 Illness, unspecified: Secondary | ICD-10-CM | POA: Diagnosis not present

## 2017-11-22 DIAGNOSIS — R109 Unspecified abdominal pain: Secondary | ICD-10-CM | POA: Insufficient documentation

## 2017-11-22 DIAGNOSIS — R1110 Vomiting, unspecified: Secondary | ICD-10-CM | POA: Diagnosis not present

## 2017-11-22 DIAGNOSIS — R197 Diarrhea, unspecified: Secondary | ICD-10-CM | POA: Insufficient documentation

## 2017-11-22 DIAGNOSIS — F17210 Nicotine dependence, cigarettes, uncomplicated: Secondary | ICD-10-CM | POA: Insufficient documentation

## 2017-11-22 LAB — CBC WITH DIFFERENTIAL/PLATELET
Abs Immature Granulocytes: 0.06 10*3/uL (ref 0.00–0.07)
Basophils Absolute: 0.1 10*3/uL (ref 0.0–0.1)
Basophils Relative: 1 %
Eosinophils Absolute: 0.2 10*3/uL (ref 0.0–0.5)
Eosinophils Relative: 1 %
HCT: 46.6 % (ref 39.0–52.0)
Hemoglobin: 14.9 g/dL (ref 13.0–17.0)
Immature Granulocytes: 1 %
Lymphocytes Relative: 14 %
Lymphs Abs: 1.9 10*3/uL (ref 0.7–4.0)
MCH: 26.1 pg (ref 26.0–34.0)
MCHC: 32 g/dL (ref 30.0–36.0)
MCV: 81.8 fL (ref 80.0–100.0)
Monocytes Absolute: 1.1 10*3/uL — ABNORMAL HIGH (ref 0.1–1.0)
Monocytes Relative: 8 %
Neutro Abs: 9.9 10*3/uL — ABNORMAL HIGH (ref 1.7–7.7)
Neutrophils Relative %: 75 %
Platelets: 168 10*3/uL (ref 150–400)
RBC: 5.7 MIL/uL (ref 4.22–5.81)
RDW: 13.5 % (ref 11.5–15.5)
WBC: 13.2 10*3/uL — ABNORMAL HIGH (ref 4.0–10.5)
nRBC: 0 % (ref 0.0–0.2)

## 2017-11-22 LAB — COMPREHENSIVE METABOLIC PANEL
ALT: 16 U/L (ref 0–44)
AST: 28 U/L (ref 15–41)
Albumin: 4 g/dL (ref 3.5–5.0)
Alkaline Phosphatase: 87 U/L (ref 38–126)
Anion gap: 8 (ref 5–15)
BUN: 13 mg/dL (ref 8–23)
CO2: 26 mmol/L (ref 22–32)
Calcium: 9 mg/dL (ref 8.9–10.3)
Chloride: 102 mmol/L (ref 98–111)
Creatinine, Ser: 1.04 mg/dL (ref 0.61–1.24)
GFR calc Af Amer: >60 mL/min (ref >60)
GFR calc non Af Amer: >60 mL/min (ref >60)
Glucose, Bld: 103 mg/dL — ABNORMAL HIGH (ref 70–99)
Potassium: 4.6 mmol/L (ref 3.5–5.1)
Sodium: 136 mmol/L (ref 135–145)
Total Bilirubin: 1.8 mg/dL — ABNORMAL HIGH (ref 0.3–1.2)
Total Protein: 7.1 g/dL (ref 6.5–8.1)

## 2017-11-22 LAB — LIPASE, BLOOD: Lipase: 20 U/L (ref 11–51)

## 2017-11-22 LAB — URINALYSIS, COMPLETE (UACMP) WITH MICROSCOPIC
Bacteria, UA: NONE SEEN
Bilirubin Urine: NEGATIVE
Glucose, UA: NEGATIVE mg/dL
Hgb urine dipstick: NEGATIVE
Ketones, ur: NEGATIVE mg/dL
Leukocytes, UA: NEGATIVE
Nitrite: NEGATIVE
Protein, ur: NEGATIVE mg/dL
Specific Gravity, Urine: 1.01 (ref 1.005–1.030)
Squamous Epithelial / LPF: NONE SEEN (ref 0–5)
WBC, UA: NONE SEEN WBC/hpf (ref 0–5)
pH: 7 (ref 5.0–8.0)

## 2017-11-22 LAB — TROPONIN I
Troponin I: 0.03 ng/mL (ref <0.03)
Troponin I: 0.03 ng/mL (ref <0.03)

## 2017-11-22 MED ORDER — IOPAMIDOL (ISOVUE-300) INJECTION 61%
30.0000 mL | Freq: Once | INTRAVENOUS | Status: AC | PRN
  Administered 2017-11-22: 30 mL via ORAL

## 2017-11-22 MED ORDER — ONDANSETRON HCL 4 MG/2ML IJ SOLN
4.0000 mg | Freq: Once | INTRAMUSCULAR | Status: AC
  Administered 2017-11-22: 4 mg via INTRAVENOUS
  Filled 2017-11-22: qty 2

## 2017-11-22 MED ORDER — SODIUM CHLORIDE 0.9 % IV BOLUS
1000.0000 mL | Freq: Once | INTRAVENOUS | Status: AC
  Administered 2017-11-22: 1000 mL via INTRAVENOUS

## 2017-11-22 MED ORDER — IOPAMIDOL (ISOVUE-300) INJECTION 61%
100.0000 mL | Freq: Once | INTRAVENOUS | Status: AC | PRN
  Administered 2017-11-22: 100 mL via INTRAVENOUS

## 2017-11-22 MED ORDER — HYDRALAZINE HCL 20 MG/ML IJ SOLN
5.0000 mg | Freq: Once | INTRAMUSCULAR | Status: AC
  Administered 2017-11-22: 5 mg via INTRAVENOUS
  Filled 2017-11-22: qty 1

## 2017-11-22 MED ORDER — ONDANSETRON 4 MG PO TBDP: ORAL_TABLET | ORAL | 0 refills | Status: DC

## 2017-11-22 NOTE — ED Notes (Signed)
Pt is AOx4, VSS, he is in bed with rails upx2, on the monitor and family is at the bedside. Pt denies any abdominal pain at this time. We will continue to monitor the pt.

## 2017-11-22 NOTE — ED Notes (Signed)
Pt is being discharged to home with caregiver. AVS and prescriptions have been given and explained to pt and caregiver and they verbalized understanding.

## 2017-11-22 NOTE — ED Triage Notes (Signed)
Pt to ED via POV with c/o abd pain and n/v/d since last night. Denies fevers. VSS. PT also states he had mechanical fall yesterday

## 2017-11-22 NOTE — ED Notes (Signed)
Pt has been given a drink and is tolerating it well so far.

## 2017-11-22 NOTE — Discharge Instructions (Signed)
Stay hydrated.   Take zofran for nausea.   You are likely going to be nauseated and have loose stools for several days.   Your blood pressure is elevated. Follow up with your doctor next week to recheck your blood pressure.   You have a spot in your liver that needs follow up with your doctor   Return to ER if you have chest pain, abdominal pain, vomiting, fever, worse dehydration

## 2017-11-22 NOTE — ED Notes (Signed)
ED Provider at bedside. Darl Householder, MD.

## 2017-11-22 NOTE — ED Provider Notes (Signed)
[facility] EMERGENCY DEPARTMENT Provider Note   CSN: [id number] Arrival date & time: 11/22/17  1740     History   Chief Complaint Chief Complaint  Patient presents with  . Abdominal Pain    HPI [name] [name] is a 77 y.o. male history of kidney stone, spinal stenosis here presenting with vomiting, diarrhea.  Patient states that he had numerous episodes of vomiting and diarrhea since yesterday.  Patient states that he had nonbilious vomiting and watery diarrhea.  Also felt lightheaded and dizzy and weak and fell yesterday. Denies passing out or chest pain before the fall. Denies any head injury, states that he just lowered himself to the floor.  Patient denies any sick contacts or fevers.  Denies any previous abdominal surgeries.  The history is provided by the patient.    Past Medical History:  Diagnosis Date  . Back pain   . Renal disorder    kidney stones  . Spinal stenosis, lumbar region, with neurogenic claudication 07/07/2014    Patient Active Problem List   Diagnosis Date Noted  . Chronic pain disorder 11/17/2017  . Unstable gait 11/17/2017  . BPH associated with nocturia 09/26/2017  . Allergic rhinitis 06/09/2017  . Tobacco use disorder 02/14/2017  . Generalized osteoarthritis of multiple sites 02/14/2017  . Bilateral lower extremity edema 08/12/2016  . Chronic right-sided low back pain with right-sided sciatica 03/14/2016  . Chronic pain of right knee 03/14/2016  . DDD (degenerative disc disease), lumbar 07/07/2014  . DDD (degenerative disc disease), cervical 07/07/2014  . DJD (degenerative joint disease) of knee 07/07/2014  . Bilateral occipital neuralgia 07/07/2014  . Spinal stenosis, lumbar region, with neurogenic claudication 07/07/2014    Past Surgical History:  Procedure Laterality Date  . CATARACT EXTRACTION W/PHACO Left 08/22/2016   Procedure: CATARACT EXTRACTION PHACO AND INTRAOCULAR LENS PLACEMENT ([place]) Left;  Surgeon:  [name], MD;  Location: [facility];  Service: Ophthalmology;  Laterality: Left;  . CATARACT EXTRACTION W/PHACO Right 10/25/2016   Procedure: CATARACT EXTRACTION PHACO AND INTRAOCULAR LENS PLACEMENT ([place]) WITH ISTENT RIGHT  ;  Surgeon: [name], MD;  Location: [facility];  Service: Ophthalmology;  Laterality: Right;  TOPICAL RIGHT  ISTENT   trabecular bypass stent was implanted/explanted.  . INGUINAL HERNIA REPAIR    . KNEE SURGERY    . NECK SURGERY          Home Medications    Prior to Admission medications   Medication Sig Start Date End Date Taking? Authorizing Provider  Oxycodone HCl 10 MG TABS Take 10 mg by mouth 2 (two) times daily.    Yes [provider]  DULoxetine (CYMBALTA) 30 MG capsule TAKE 1 CAPSULE(30 MG) BY MOUTH DAILY Patient not taking: Reported on 11/22/2017 11/20/17   [name], [name], MD  nicotine (NICODERM CQ - DOSED IN MG/24 HOURS) 14 mg/24hr patch Place 1 patch (14 mg total) onto the skin daily. Patient not taking: Reported on 11/22/2017 09/26/17   [name], [name], MD  tamsulosin (FLOMAX) 0.4 MG CAPS capsule Take 1 capsule (0.4 mg total) by mouth daily. Patient not taking: Reported on 11/22/2017 09/26/17   [name], [name], MD    Family History Family History  Problem Relation Age of Onset  . Diabetes Mother   . Diabetes Brother   . Diabetes Sister     Social History Social History   Tobacco Use  . Smoking status: Current Every Day Smoker    Packs/day: 0.75  Types: Cigarettes  . Smokeless tobacco: Never Used  Substance Use Topics  . Alcohol use: No  . Drug use: No     Allergies   Patient has no known allergies.   Review of Systems Review of Systems  Gastrointestinal: Positive for abdominal pain, diarrhea and vomiting.  All other systems reviewed and are negative.    Physical Exam Updated Vital Signs BP (!) 166/90 (BP Location: Left Arm)   Pulse 62   Temp 98.4 F (36.9 C) (Oral)   Resp  17   Ht 5\' 10"  (1.778 m)   Wt 63.5 kg   SpO2 99%   BMI 20.09 kg/m   Physical Exam  Constitutional: He is oriented to person, place, and time.  Dehydrated   HENT:  Head: Normocephalic.  MM slightly dry   Eyes: Pupils are equal, round, and reactive to light. EOM are normal.  Cardiovascular: Normal rate and regular rhythm.  Pulmonary/Chest: Effort normal.  Abdominal: Normal appearance.  Increased bowel sounds throughout. Mild diffuse tenderness.   Neurological: He is alert and oriented to person, place, and time.  Skin: Skin is warm. Capillary refill takes less than 2 seconds.  Psychiatric: He has a normal mood and affect.  Nursing note and vitals reviewed.    ED Treatments / Results  Labs (all labs ordered are listed, but only abnormal results are displayed) Labs Reviewed  CBC WITH DIFFERENTIAL/PLATELET - Abnormal; Notable for the following components:      Result Value   WBC 13.2 (*)    Neutro Abs 9.9 (*)    Monocytes Absolute 1.1 (*)    All other components within normal limits  COMPREHENSIVE METABOLIC PANEL - Abnormal; Notable for the following components:   Glucose, Bld 103 (*)    Total Bilirubin 1.8 (*)    All other components within normal limits  TROPONIN I - Abnormal; Notable for the following components:   Troponin I 0.03 (*)    All other components within normal limits  URINALYSIS, COMPLETE (UACMP) WITH MICROSCOPIC - Abnormal; Notable for the following components:   Color, Urine STRAW (*)    APPearance CLEAR (*)    All other components within normal limits  TROPONIN I - Abnormal; Notable for the following components:   Troponin I 0.03 (*)    All other components within normal limits  LIPASE, BLOOD    EKG None  Radiology Ct Abdomen Pelvis W Contrast  Result Date: 11/22/2017 CLINICAL DATA:  Abdominal pain with nausea, vomiting and diarrhea since last night. EXAM: CT ABDOMEN AND PELVIS WITH CONTRAST TECHNIQUE: Multidetector CT imaging of the abdomen and  pelvis was performed using the standard protocol following bolus administration of intravenous contrast. CONTRAST:  171mL ISOVUE-300 IOPAMIDOL (ISOVUE-300) INJECTION 61% COMPARISON:  12/16/2016, 03/02/2014. FINDINGS: Lower chest: Minor basilar atelectasis/scarring. 5 mm nodule, right middle lobe, image 10, series 4, stable from prior exams. No acute findings. Hepatobiliary: 13 mm round enhancing lesion, straddling segments 4B and 5, not seen on the delayed sequence. This is likely a small hemangioma or a transient hepatic attenuation difference. It is not visualized on prior CTs. No other liver abnormality. Normal gallbladder. No bile duct dilation. Pancreas: Unremarkable. No pancreatic ductal dilatation or surrounding inflammatory changes. Spleen: Normal size. Several small low-density areas likely due to differential enhancement. No definite mass. Adrenals/Urinary Tract: No adrenal masses. Kidneys normal in position. There is significant right renal cortical thinning and more focal scarring. Several low-attenuation, nonenhancing renal lesions noted bilaterally consistent with cysts. No intrarenal stones.  Mild bilateral renal collecting system dilation. Mild dilation of the ureters. No ureteral stones. Bladder is unremarkable. Stomach/Bowel: Stomach and small bowel unremarkable. Colon is normal in caliber. No wall thickening or inflammation. Mild increased stool burden throughout the colon. Moderate distention of the rectum with stool. Normal appendix visualized. Vascular/Lymphatic: Minimal aortic atherosclerosis. No aneurysm. No enlarged lymph nodes. Reproductive: Unremarkable. Other: No abdominal wall hernia or abnormality. No abdominopelvic ascites. Musculoskeletal: No fracture or acute finding. No osteoblastic or osteolytic lesions. IMPRESSION: 1. No acute findings within the abdomen or pelvis. No evidence of bowel inflammation. 2. Mild increased stool throughout the colon. Moderate distention of the rectum  with stool. 3. 13 mm enhancing liver lesion. This is most likely either a transient hepatic attenuation difference due to an incidental area shunting or a hemangioma. The prior CTs for unenhanced, with the lesion not visualized. Recommend further assessment with liver MRI with and without contrast versus short-term CT follow-up with repeat enhanced abdomen CT 3-6 months. 4. Minor aortic atherosclerosis. Electronically Signed   By: Lajean Manes M.D.   On: 11/22/2017 11:49    Procedures Procedures (including critical care time)  Medications Ordered in ED Medications  sodium chloride 0.9 % bolus 1,000 mL (0 mLs Intravenous Stopped 11/22/17 1026)  ondansetron (ZOFRAN) injection 4 mg (4 mg Intravenous Given 11/22/17 0956)  iopamidol (ISOVUE-300) 61 % injection 30 mL (30 mLs Oral Contrast Given 11/22/17 1005)  iopamidol (ISOVUE-300) 61 % injection 100 mL (100 mLs Intravenous Contrast Given 11/22/17 1102)  hydrALAZINE (APRESOLINE) injection 5 mg (5 mg Intravenous Given 11/22/17 1328)     Initial Impression / Assessment and Plan / ED Course  I have reviewed the triage vital signs and the nursing notes.  Pertinent labs & imaging results that were available during my care of the patient were reviewed by me and considered in my medical decision making (see chart for details).     [name] [name] is a 77 y.o. male here with abdominal pain, vomiting, diarrhea. Likely viral gastro. Consider SBO as well. Will get labs, UA, CT ab/pel.  1:50 PM Trop 0.03. Repeat remained the same. No chest pain. Bilirubin is 1.8 but LFTs nl. CT showed liver lesion, unclear if there is hemangioma or not. No RUQ tenderness. He has constipation on CT but has diarrhea. I think he has viral gastro. Tolerated PO in the ED. Of note, he was hypertensive to 190s but is not on BP meds. BP down to 160s on discharge. Will dc home with zofran prn, repeat BP with PCP in a week.    Final Clinical Impressions(s) / ED Diagnoses     Final diagnoses:  None    ED Discharge Orders    None       Drenda Freeze, MD 11/22/17 1352

## 2017-11-27 ENCOUNTER — Encounter: Payer: Self-pay | Admitting: Family Medicine

## 2017-11-27 ENCOUNTER — Ambulatory Visit (INDEPENDENT_AMBULATORY_CARE_PROVIDER_SITE_OTHER): Payer: Medicare HMO | Admitting: Family Medicine

## 2017-11-27 VITALS — BP 130/82 | HR 72 | Temp 98.2°F | Resp 16 | Ht 70.0 in | Wt 144.4 lb

## 2017-11-27 DIAGNOSIS — R2681 Unsteadiness on feet: Secondary | ICD-10-CM

## 2017-11-27 DIAGNOSIS — F17200 Nicotine dependence, unspecified, uncomplicated: Secondary | ICD-10-CM

## 2017-11-27 DIAGNOSIS — K769 Liver disease, unspecified: Secondary | ICD-10-CM | POA: Diagnosis not present

## 2017-11-27 DIAGNOSIS — K529 Noninfective gastroenteritis and colitis, unspecified: Secondary | ICD-10-CM | POA: Diagnosis not present

## 2017-11-27 DIAGNOSIS — M159 Polyosteoarthritis, unspecified: Secondary | ICD-10-CM | POA: Diagnosis not present

## 2017-11-27 DIAGNOSIS — R69 Illness, unspecified: Secondary | ICD-10-CM | POA: Diagnosis not present

## 2017-11-27 NOTE — Assessment & Plan Note (Addendum)
He was hoping that Cymbalta will help with balance. Fall precautions discussed. Explained that Cymbalta as well as some of his other medications could increase risk for falls.

## 2017-11-27 NOTE — Patient Instructions (Addendum)
A few things to remember from today's visit:   Lesion of liver - Plan: MR LIVER W WO CONTRAST  Liver disease  Generalized osteoarthritis of multiple sites  Acute gastroenteritis  Unstable gait  Tobacco use disorder   Please be sure medication list is accurate. If a new problem present, please set up appointment sooner than planned today.

## 2017-11-27 NOTE — Assessment & Plan Note (Addendum)
Tolerating Cymbalta 30 mg well. He already noticed some improvement in joint pain, so he will continue same dose. Explained that purpose of medication is to help with OA/back pain.  Pain-free is not a reasonable expectation.  We will move appointment that was scheduled for 12/2017 for 01/2018. We discussed some side effects of medications.

## 2017-11-27 NOTE — Assessment & Plan Note (Signed)
Educated about adverse effects of tobacco use as well as benefits of smoking cessation. He is not interested in quitting smoking.

## 2017-11-27 NOTE — Progress Notes (Signed)
HPI:   [name] is a 77 y.o. male, who is here today with his male friend to follow on recent OV/ER.   He was last seen 11/17/2017, when Cymbalta 30 mg was started to treat generalized OA and chronic back pain + neuropathic lower extremity pain. He has tolerated medication well, denies side effects and has noted some improvement in his stiffness and joint pain.  He states that would like to be able to walk with no pain and without assistance.   -He was seen in the ED on 11/22/2017 because of 1 day of nausea, vomiting, and diarrhea.  He was treated with IV fluids and Zofran IV in the ED  GI symptoms resolved. His appetite is back to his normal. Denies fever, chills, or urinary symptoms.   His sister has been sick with similar symptoms.  Lab Results  Component Value Date   CREATININE 1.04 11/22/2017   BUN 13 11/22/2017   NA 136 11/22/2017   K 4.6 11/22/2017   CL 102 11/22/2017   CO2 26 11/22/2017   Lab Results  Component Value Date   WBC 13.2 (H) 11/22/2017   HGB 14.9 11/22/2017   HCT 46.6 11/22/2017   MCV 81.8 11/22/2017   PLT 168 11/22/2017   Troponin x2 slightly abnormal, 0.032. No chest pain, dyspnea, or diaphoresis. + Smoking, he is not interested in smoking cessation.   Lab Results  Component Value Date   ALT 16 11/22/2017   AST 28 11/22/2017   ALKPHOS 87 11/22/2017   BILITOT 1.8 (H) 11/22/2017   CT abdomen pelvis W contrast   1. No acute findings within the abdomen or pelvis. No evidence of bowel inflammation.  2. Mild increased stool throughout the colon. Moderate distention of the rectum with stool. 3. 13 mm enhancing liver lesion. This is most likely either a transient hepatic attenuation difference due to an incidental area shunting or a hemangioma. The prior CTs for unenhanced, with the lesion not visualized. Recommend further assessment with liver MRI with and without contrast versus short-term CT follow-up with repeat  enhanced abdomen CT 3-6 months. 4. Minor aortic atherosclerosis.  He denies history of alcohol abuse.  Review of Systems  Constitutional: Positive for fatigue. Negative for appetite change and fever.  HENT: Negative for mouth sores, nosebleeds and trouble swallowing.   Eyes: Negative for redness and visual disturbance.  Respiratory: Negative for cough, shortness of breath and wheezing.   Cardiovascular: Negative for palpitations and leg swelling.  Gastrointestinal: Negative for abdominal pain, diarrhea, nausea and vomiting.  Genitourinary: Negative for decreased urine volume, dysuria and hematuria.  Musculoskeletal: Positive for arthralgias, back pain and gait problem. Negative for myalgias.  Skin: Negative for pallor and rash.  Neurological: Negative for syncope, weakness and headaches.  Psychiatric/Behavioral: Negative for confusion. The patient is nervous/anxious.       Current Outpatient Medications on File Prior to Visit  Medication Sig Dispense Refill  . DULoxetine (CYMBALTA) 30 MG capsule TAKE 1 CAPSULE(30 MG) BY MOUTH DAILY 90 capsule 1  . nicotine (NICODERM CQ - DOSED IN MG/24 HOURS) 14 mg/24hr patch Place 1 patch (14 mg total) onto the skin daily. 28 patch 0  . ondansetron (ZOFRAN ODT) 4 MG disintegrating tablet 4mg  ODT q4 hours prn nausea/vomit 10 tablet 0  . Oxycodone HCl 10 MG TABS Take 10 mg by mouth 2 (two) times daily.     . tamsulosin (FLOMAX) 0.4 MG CAPS capsule Take 1 capsule (0.4 mg total)  by mouth daily. 30 capsule 3   No current facility-administered medications on file prior to visit.      Past Medical History:  Diagnosis Date  . Back pain   . Renal disorder    kidney stones  . Spinal stenosis, lumbar region, with neurogenic claudication 07/07/2014   No Known Allergies  Social History   Socioeconomic History  . Marital status: Widowed    Spouse name: Not on file  . Number of children: Not on file  . Years of education: Not on file  . Highest  education level: Not on file  Occupational History  . Not on file  Social Needs  . Financial resource strain: Not on file  . Food insecurity:    Worry: Not on file    Inability: Not on file  . Transportation needs:    Medical: Not on file    Non-medical: Not on file  Tobacco Use  . Smoking status: Current Every Day Smoker    Packs/day: 0.75    Types: Cigarettes  . Smokeless tobacco: Never Used  Substance and Sexual Activity  . Alcohol use: No  . Drug use: No  . Sexual activity: Not on file  Lifestyle  . Physical activity:    Days per week: Not on file    Minutes per session: Not on file  . Stress: Not on file  Relationships  . Social connections:    Talks on phone: Not on file    Gets together: Not on file    Attends religious service: Not on file    Active member of club or organization: Not on file    Attends meetings of clubs or organizations: Not on file    Relationship status: Not on file  Other Topics Concern  . Not on file  Social History Narrative  . Not on file    Vitals:   11/27/17 1405  BP: 130/82  Pulse: 72  Resp: 16  Temp: 98.2 F (36.8 C)  SpO2: 97%   Body mass index is 20.72 kg/m.    Physical Exam  Nursing note and vitals reviewed. Constitutional: He is oriented to person, place, and time. He appears well-developed and well-nourished. No distress.  HENT:  Head: Normocephalic and atraumatic.  Mouth/Throat: Oropharynx is clear and moist and mucous membranes are normal.  Eyes: Conjunctivae and EOM are normal.  Cardiovascular: Normal rate and regular rhythm.  No murmur heard. Respiratory: Effort normal and breath sounds normal. No respiratory distress.  GI: Soft. Bowel sounds are normal. There is no hepatomegaly. There is no tenderness.  Musculoskeletal: He exhibits no edema.  Lymphadenopathy:    He has no cervical adenopathy.  Neurological: He is alert and oriented to person, place, and time. He has normal strength.  Unstable gait  assisted with a cane.  Skin: Skin is warm. No erythema.  Psychiatric: His mood appears anxious.  Well-groomed, good eye contact.    ASSESSMENT AND PLAN:  [name] was seen today for er follow-up.   Orders Placed This Encounter  Procedures  . MR LIVER W WO CONTRAST     Lesion of liver Possible etiology discussed, most likely benign lesion. After discussion of options, at this time he prefers liver MRI. LFTs otherwise normal except for mildly elevated total bilirubin, we will plan on rechecking next visit.  -     MR LIVER W WO CONTRAST; Future  Acute gastroenteritis History suggest viral gastroenteritis. Symptoms have resolved. Follow-up as needed.   Generalized osteoarthritis of  multiple sites Tolerating Cymbalta 30 mg well. He already noticed some improvement in joint pain, so he will continue same dose. Explained that purpose of medication is to help with OA/back pain.  Pain-free is not a reasonable expectation.  We will move appointment that was scheduled for 12/2017 for 01/2018. We discussed some side effects of medications.   Unstable gait He was hoping that Cymbalta will help with balance. Fall precautions discussed. Explained that Cymbalta as well as some of his other medications could increase risk for falls.  Tobacco use disorder Educated about adverse effects of tobacco use as well as benefits of smoking cessation. He is not interested in quitting smoking.      Alaiya Martindelcampo G. [name], MD  [facility]. [place] office.

## 2017-11-28 ENCOUNTER — Encounter: Payer: Self-pay | Admitting: Physical Medicine & Rehabilitation

## 2017-11-28 DIAGNOSIS — I10 Essential (primary) hypertension: Secondary | ICD-10-CM | POA: Diagnosis not present

## 2017-11-28 DIAGNOSIS — M5431 Sciatica, right side: Secondary | ICD-10-CM | POA: Diagnosis not present

## 2017-11-28 DIAGNOSIS — M6281 Muscle weakness (generalized): Secondary | ICD-10-CM | POA: Diagnosis not present

## 2017-11-28 DIAGNOSIS — M48062 Spinal stenosis, lumbar region with neurogenic claudication: Secondary | ICD-10-CM | POA: Diagnosis not present

## 2017-11-28 DIAGNOSIS — Z720 Tobacco use: Secondary | ICD-10-CM | POA: Diagnosis not present

## 2017-11-28 DIAGNOSIS — M150 Primary generalized (osteo)arthritis: Secondary | ICD-10-CM | POA: Diagnosis not present

## 2017-11-28 DIAGNOSIS — G894 Chronic pain syndrome: Secondary | ICD-10-CM | POA: Diagnosis not present

## 2017-11-28 DIAGNOSIS — Z9181 History of falling: Secondary | ICD-10-CM | POA: Diagnosis not present

## 2017-11-28 DIAGNOSIS — N400 Enlarged prostate without lower urinary tract symptoms: Secondary | ICD-10-CM | POA: Diagnosis not present

## 2017-11-29 ENCOUNTER — Telehealth: Payer: Self-pay | Admitting: Family Medicine

## 2017-11-29 DIAGNOSIS — Z720 Tobacco use: Secondary | ICD-10-CM | POA: Diagnosis not present

## 2017-11-29 DIAGNOSIS — I10 Essential (primary) hypertension: Secondary | ICD-10-CM | POA: Diagnosis not present

## 2017-11-29 DIAGNOSIS — M150 Primary generalized (osteo)arthritis: Secondary | ICD-10-CM | POA: Diagnosis not present

## 2017-11-29 DIAGNOSIS — M5431 Sciatica, right side: Secondary | ICD-10-CM | POA: Diagnosis not present

## 2017-11-29 DIAGNOSIS — M6281 Muscle weakness (generalized): Secondary | ICD-10-CM | POA: Diagnosis not present

## 2017-11-29 DIAGNOSIS — M48062 Spinal stenosis, lumbar region with neurogenic claudication: Secondary | ICD-10-CM | POA: Diagnosis not present

## 2017-11-29 DIAGNOSIS — G894 Chronic pain syndrome: Secondary | ICD-10-CM | POA: Diagnosis not present

## 2017-11-29 DIAGNOSIS — N400 Enlarged prostate without lower urinary tract symptoms: Secondary | ICD-10-CM | POA: Diagnosis not present

## 2017-11-29 NOTE — Telephone Encounter (Unsigned)
Copied from [place] [id number]. Topic: Quick Communication - Home Health Verbal Orders >> Nov 29, 2017  2:56 PM Carolyn Stare wrote: Caller/Agency [facility]  Callback Number [id number] [phone]  Requesting  Verbal prders for PT   Frequency  2 x 4   req RX  rollator

## 2017-11-30 DIAGNOSIS — M48062 Spinal stenosis, lumbar region with neurogenic claudication: Secondary | ICD-10-CM | POA: Diagnosis not present

## 2017-11-30 DIAGNOSIS — I10 Essential (primary) hypertension: Secondary | ICD-10-CM | POA: Diagnosis not present

## 2017-11-30 DIAGNOSIS — M150 Primary generalized (osteo)arthritis: Secondary | ICD-10-CM | POA: Diagnosis not present

## 2017-11-30 DIAGNOSIS — M6281 Muscle weakness (generalized): Secondary | ICD-10-CM | POA: Diagnosis not present

## 2017-11-30 DIAGNOSIS — M5431 Sciatica, right side: Secondary | ICD-10-CM | POA: Diagnosis not present

## 2017-11-30 DIAGNOSIS — N400 Enlarged prostate without lower urinary tract symptoms: Secondary | ICD-10-CM | POA: Diagnosis not present

## 2017-11-30 DIAGNOSIS — Z720 Tobacco use: Secondary | ICD-10-CM | POA: Diagnosis not present

## 2017-11-30 DIAGNOSIS — G894 Chronic pain syndrome: Secondary | ICD-10-CM | POA: Diagnosis not present

## 2017-12-01 ENCOUNTER — Ambulatory Visit (HOSPITAL_COMMUNITY)
Admission: RE | Admit: 2017-12-01 | Discharge: 2017-12-01 | Disposition: A | Discharge status: Home / Self Care | Payer: Medicare HMO | Source: Ambulatory Visit | Attending: Cardiovascular Disease | Admitting: Cardiovascular Disease

## 2017-12-01 DIAGNOSIS — R0989 Other specified symptoms and signs involving the circulatory and respiratory systems: Secondary | ICD-10-CM

## 2017-12-01 NOTE — Telephone Encounter (Signed)
Left message for [name] to return call to clinic for verbal orders.

## 2017-12-04 DIAGNOSIS — I10 Essential (primary) hypertension: Secondary | ICD-10-CM | POA: Diagnosis not present

## 2017-12-04 DIAGNOSIS — N400 Enlarged prostate without lower urinary tract symptoms: Secondary | ICD-10-CM | POA: Diagnosis not present

## 2017-12-04 DIAGNOSIS — M6281 Muscle weakness (generalized): Secondary | ICD-10-CM | POA: Diagnosis not present

## 2017-12-04 DIAGNOSIS — G894 Chronic pain syndrome: Secondary | ICD-10-CM | POA: Diagnosis not present

## 2017-12-04 DIAGNOSIS — M48062 Spinal stenosis, lumbar region with neurogenic claudication: Secondary | ICD-10-CM | POA: Diagnosis not present

## 2017-12-04 DIAGNOSIS — M5431 Sciatica, right side: Secondary | ICD-10-CM | POA: Diagnosis not present

## 2017-12-04 DIAGNOSIS — M150 Primary generalized (osteo)arthritis: Secondary | ICD-10-CM | POA: Diagnosis not present

## 2017-12-04 DIAGNOSIS — Z720 Tobacco use: Secondary | ICD-10-CM | POA: Diagnosis not present

## 2017-12-04 NOTE — Telephone Encounter (Signed)
Verbal orders given to [facility] as requested.

## 2017-12-05 DIAGNOSIS — M150 Primary generalized (osteo)arthritis: Secondary | ICD-10-CM | POA: Diagnosis not present

## 2017-12-05 DIAGNOSIS — Z720 Tobacco use: Secondary | ICD-10-CM | POA: Diagnosis not present

## 2017-12-05 DIAGNOSIS — M48062 Spinal stenosis, lumbar region with neurogenic claudication: Secondary | ICD-10-CM | POA: Diagnosis not present

## 2017-12-05 DIAGNOSIS — I10 Essential (primary) hypertension: Secondary | ICD-10-CM | POA: Diagnosis not present

## 2017-12-05 DIAGNOSIS — M6281 Muscle weakness (generalized): Secondary | ICD-10-CM | POA: Diagnosis not present

## 2017-12-05 DIAGNOSIS — G894 Chronic pain syndrome: Secondary | ICD-10-CM | POA: Diagnosis not present

## 2017-12-05 DIAGNOSIS — N400 Enlarged prostate without lower urinary tract symptoms: Secondary | ICD-10-CM | POA: Diagnosis not present

## 2017-12-05 DIAGNOSIS — M5431 Sciatica, right side: Secondary | ICD-10-CM | POA: Diagnosis not present

## 2017-12-06 DIAGNOSIS — M48062 Spinal stenosis, lumbar region with neurogenic claudication: Secondary | ICD-10-CM | POA: Diagnosis not present

## 2017-12-06 DIAGNOSIS — M150 Primary generalized (osteo)arthritis: Secondary | ICD-10-CM | POA: Diagnosis not present

## 2017-12-06 DIAGNOSIS — M6281 Muscle weakness (generalized): Secondary | ICD-10-CM | POA: Diagnosis not present

## 2017-12-06 DIAGNOSIS — I10 Essential (primary) hypertension: Secondary | ICD-10-CM | POA: Diagnosis not present

## 2017-12-06 DIAGNOSIS — N400 Enlarged prostate without lower urinary tract symptoms: Secondary | ICD-10-CM | POA: Diagnosis not present

## 2017-12-06 DIAGNOSIS — Z720 Tobacco use: Secondary | ICD-10-CM | POA: Diagnosis not present

## 2017-12-06 DIAGNOSIS — G894 Chronic pain syndrome: Secondary | ICD-10-CM | POA: Diagnosis not present

## 2017-12-06 DIAGNOSIS — M5431 Sciatica, right side: Secondary | ICD-10-CM | POA: Diagnosis not present

## 2017-12-06 NOTE — Telephone Encounter (Signed)
[facility] called in, they would need a script faxed over from the provider, for the pt to receive a rollator.   Fax number [phone]

## 2017-12-08 DIAGNOSIS — G894 Chronic pain syndrome: Secondary | ICD-10-CM | POA: Diagnosis not present

## 2017-12-08 DIAGNOSIS — N400 Enlarged prostate without lower urinary tract symptoms: Secondary | ICD-10-CM | POA: Diagnosis not present

## 2017-12-08 DIAGNOSIS — I10 Essential (primary) hypertension: Secondary | ICD-10-CM | POA: Diagnosis not present

## 2017-12-08 DIAGNOSIS — M48062 Spinal stenosis, lumbar region with neurogenic claudication: Secondary | ICD-10-CM | POA: Diagnosis not present

## 2017-12-08 DIAGNOSIS — Z720 Tobacco use: Secondary | ICD-10-CM | POA: Diagnosis not present

## 2017-12-08 DIAGNOSIS — M6281 Muscle weakness (generalized): Secondary | ICD-10-CM | POA: Diagnosis not present

## 2017-12-08 DIAGNOSIS — M5431 Sciatica, right side: Secondary | ICD-10-CM | POA: Diagnosis not present

## 2017-12-08 DIAGNOSIS — M150 Primary generalized (osteo)arthritis: Secondary | ICD-10-CM | POA: Diagnosis not present

## 2017-12-11 DIAGNOSIS — I10 Essential (primary) hypertension: Secondary | ICD-10-CM | POA: Diagnosis not present

## 2017-12-11 DIAGNOSIS — N400 Enlarged prostate without lower urinary tract symptoms: Secondary | ICD-10-CM | POA: Diagnosis not present

## 2017-12-11 DIAGNOSIS — M5431 Sciatica, right side: Secondary | ICD-10-CM | POA: Diagnosis not present

## 2017-12-11 DIAGNOSIS — M48062 Spinal stenosis, lumbar region with neurogenic claudication: Secondary | ICD-10-CM | POA: Diagnosis not present

## 2017-12-11 DIAGNOSIS — M150 Primary generalized (osteo)arthritis: Secondary | ICD-10-CM | POA: Diagnosis not present

## 2017-12-11 DIAGNOSIS — Z720 Tobacco use: Secondary | ICD-10-CM | POA: Diagnosis not present

## 2017-12-11 DIAGNOSIS — M6281 Muscle weakness (generalized): Secondary | ICD-10-CM | POA: Diagnosis not present

## 2017-12-11 DIAGNOSIS — G894 Chronic pain syndrome: Secondary | ICD-10-CM | POA: Diagnosis not present

## 2017-12-11 NOTE — Telephone Encounter (Signed)
Carolyn Pt from [place] [phone] calling wanting to know if an RX can be sent or resent to the [facility] for [facility] fax number [phone]

## 2017-12-11 NOTE — Telephone Encounter (Signed)
Rx for rollator faxed to [facility] as requested.

## 2017-12-11 NOTE — Telephone Encounter (Signed)
Wrong office

## 2017-12-12 DIAGNOSIS — M5431 Sciatica, right side: Secondary | ICD-10-CM | POA: Diagnosis not present

## 2017-12-12 DIAGNOSIS — M6281 Muscle weakness (generalized): Secondary | ICD-10-CM | POA: Diagnosis not present

## 2017-12-12 DIAGNOSIS — N400 Enlarged prostate without lower urinary tract symptoms: Secondary | ICD-10-CM | POA: Diagnosis not present

## 2017-12-12 DIAGNOSIS — M150 Primary generalized (osteo)arthritis: Secondary | ICD-10-CM | POA: Diagnosis not present

## 2017-12-12 DIAGNOSIS — G894 Chronic pain syndrome: Secondary | ICD-10-CM | POA: Diagnosis not present

## 2017-12-12 DIAGNOSIS — I10 Essential (primary) hypertension: Secondary | ICD-10-CM | POA: Diagnosis not present

## 2017-12-12 DIAGNOSIS — Z720 Tobacco use: Secondary | ICD-10-CM | POA: Diagnosis not present

## 2017-12-12 DIAGNOSIS — M48062 Spinal stenosis, lumbar region with neurogenic claudication: Secondary | ICD-10-CM | POA: Diagnosis not present

## 2017-12-13 DIAGNOSIS — M5431 Sciatica, right side: Secondary | ICD-10-CM | POA: Diagnosis not present

## 2017-12-13 DIAGNOSIS — M6281 Muscle weakness (generalized): Secondary | ICD-10-CM | POA: Diagnosis not present

## 2017-12-13 DIAGNOSIS — N400 Enlarged prostate without lower urinary tract symptoms: Secondary | ICD-10-CM | POA: Diagnosis not present

## 2017-12-13 DIAGNOSIS — I10 Essential (primary) hypertension: Secondary | ICD-10-CM | POA: Diagnosis not present

## 2017-12-13 DIAGNOSIS — M150 Primary generalized (osteo)arthritis: Secondary | ICD-10-CM | POA: Diagnosis not present

## 2017-12-13 DIAGNOSIS — Z720 Tobacco use: Secondary | ICD-10-CM | POA: Diagnosis not present

## 2017-12-13 DIAGNOSIS — G894 Chronic pain syndrome: Secondary | ICD-10-CM | POA: Diagnosis not present

## 2017-12-13 DIAGNOSIS — M48062 Spinal stenosis, lumbar region with neurogenic claudication: Secondary | ICD-10-CM | POA: Diagnosis not present

## 2017-12-14 DIAGNOSIS — M6281 Muscle weakness (generalized): Secondary | ICD-10-CM | POA: Diagnosis not present

## 2017-12-14 DIAGNOSIS — Z720 Tobacco use: Secondary | ICD-10-CM | POA: Diagnosis not present

## 2017-12-14 DIAGNOSIS — M48062 Spinal stenosis, lumbar region with neurogenic claudication: Secondary | ICD-10-CM | POA: Diagnosis not present

## 2017-12-14 DIAGNOSIS — G894 Chronic pain syndrome: Secondary | ICD-10-CM | POA: Diagnosis not present

## 2017-12-14 DIAGNOSIS — M150 Primary generalized (osteo)arthritis: Secondary | ICD-10-CM | POA: Diagnosis not present

## 2017-12-14 DIAGNOSIS — N400 Enlarged prostate without lower urinary tract symptoms: Secondary | ICD-10-CM | POA: Diagnosis not present

## 2017-12-14 DIAGNOSIS — M5431 Sciatica, right side: Secondary | ICD-10-CM | POA: Diagnosis not present

## 2017-12-14 DIAGNOSIS — I10 Essential (primary) hypertension: Secondary | ICD-10-CM | POA: Diagnosis not present

## 2017-12-17 ENCOUNTER — Other Ambulatory Visit: Payer: Medicare HMO

## 2017-12-17 ENCOUNTER — Ambulatory Visit
Admission: RE | Admit: 2017-12-17 | Discharge: 2017-12-17 | Disposition: A | Discharge status: Home / Self Care | Payer: Medicare HMO | Source: Ambulatory Visit | Attending: Family Medicine | Admitting: Family Medicine

## 2017-12-17 DIAGNOSIS — K769 Liver disease, unspecified: Secondary | ICD-10-CM

## 2017-12-17 DIAGNOSIS — K7689 Other specified diseases of liver: Secondary | ICD-10-CM | POA: Diagnosis not present

## 2017-12-17 MED ORDER — GADOBENATE DIMEGLUMINE 529 MG/ML IV SOLN
14.0000 mL | Freq: Once | INTRAVENOUS | Status: AC | PRN
  Administered 2017-12-17: 14 mL via INTRAVENOUS

## 2017-12-19 ENCOUNTER — Encounter: Payer: Medicare HMO | Attending: Family Medicine

## 2017-12-19 ENCOUNTER — Ambulatory Visit: Payer: Medicare HMO | Admitting: Physical Medicine & Rehabilitation

## 2017-12-19 DIAGNOSIS — M48062 Spinal stenosis, lumbar region with neurogenic claudication: Secondary | ICD-10-CM | POA: Diagnosis not present

## 2017-12-19 DIAGNOSIS — Z720 Tobacco use: Secondary | ICD-10-CM | POA: Diagnosis not present

## 2017-12-19 DIAGNOSIS — M6281 Muscle weakness (generalized): Secondary | ICD-10-CM | POA: Diagnosis not present

## 2017-12-19 DIAGNOSIS — N400 Enlarged prostate without lower urinary tract symptoms: Secondary | ICD-10-CM | POA: Diagnosis not present

## 2017-12-19 DIAGNOSIS — M150 Primary generalized (osteo)arthritis: Secondary | ICD-10-CM | POA: Diagnosis not present

## 2017-12-19 DIAGNOSIS — R2689 Other abnormalities of gait and mobility: Secondary | ICD-10-CM | POA: Diagnosis not present

## 2017-12-19 DIAGNOSIS — A4189 Other specified sepsis: Secondary | ICD-10-CM | POA: Diagnosis not present

## 2017-12-19 DIAGNOSIS — I10 Essential (primary) hypertension: Secondary | ICD-10-CM | POA: Diagnosis not present

## 2017-12-19 DIAGNOSIS — M5431 Sciatica, right side: Secondary | ICD-10-CM | POA: Diagnosis not present

## 2017-12-19 DIAGNOSIS — G894 Chronic pain syndrome: Secondary | ICD-10-CM | POA: Diagnosis not present

## 2017-12-22 ENCOUNTER — Emergency Department: Payer: Medicare HMO

## 2017-12-22 ENCOUNTER — Encounter: Payer: Self-pay | Admitting: Emergency Medicine

## 2017-12-22 ENCOUNTER — Other Ambulatory Visit: Payer: Self-pay

## 2017-12-22 ENCOUNTER — Telehealth: Payer: Self-pay | Admitting: Family Medicine

## 2017-12-22 ENCOUNTER — Emergency Department
Admission: EM | Admit: 2017-12-22 | Discharge: 2017-12-22 | Disposition: A | Discharge status: Home / Self Care | Payer: Medicare HMO | Attending: Emergency Medicine | Admitting: Emergency Medicine

## 2017-12-22 DIAGNOSIS — W010XXA Fall on same level from slipping, tripping and stumbling without subsequent striking against object, initial encounter: Secondary | ICD-10-CM | POA: Diagnosis not present

## 2017-12-22 DIAGNOSIS — Y999 Unspecified external cause status: Secondary | ICD-10-CM | POA: Insufficient documentation

## 2017-12-22 DIAGNOSIS — M48062 Spinal stenosis, lumbar region with neurogenic claudication: Secondary | ICD-10-CM | POA: Diagnosis not present

## 2017-12-22 DIAGNOSIS — M150 Primary generalized (osteo)arthritis: Secondary | ICD-10-CM | POA: Diagnosis not present

## 2017-12-22 DIAGNOSIS — G8929 Other chronic pain: Secondary | ICD-10-CM | POA: Diagnosis not present

## 2017-12-22 DIAGNOSIS — M545 Low back pain: Secondary | ICD-10-CM | POA: Diagnosis not present

## 2017-12-22 DIAGNOSIS — Z79899 Other long term (current) drug therapy: Secondary | ICD-10-CM | POA: Insufficient documentation

## 2017-12-22 DIAGNOSIS — M6281 Muscle weakness (generalized): Secondary | ICD-10-CM | POA: Diagnosis not present

## 2017-12-22 DIAGNOSIS — Y9384 Activity, sleeping: Secondary | ICD-10-CM | POA: Insufficient documentation

## 2017-12-22 DIAGNOSIS — F17210 Nicotine dependence, cigarettes, uncomplicated: Secondary | ICD-10-CM | POA: Diagnosis not present

## 2017-12-22 DIAGNOSIS — Y92013 Bedroom of single-family (private) house as the place of occurrence of the external cause: Secondary | ICD-10-CM | POA: Insufficient documentation

## 2017-12-22 DIAGNOSIS — G894 Chronic pain syndrome: Secondary | ICD-10-CM | POA: Diagnosis not present

## 2017-12-22 DIAGNOSIS — S0990XA Unspecified injury of head, initial encounter: Secondary | ICD-10-CM | POA: Diagnosis not present

## 2017-12-22 DIAGNOSIS — G44309 Post-traumatic headache, unspecified, not intractable: Secondary | ICD-10-CM | POA: Diagnosis not present

## 2017-12-22 DIAGNOSIS — R110 Nausea: Secondary | ICD-10-CM | POA: Diagnosis not present

## 2017-12-22 DIAGNOSIS — W19XXXA Unspecified fall, initial encounter: Secondary | ICD-10-CM | POA: Diagnosis not present

## 2017-12-22 DIAGNOSIS — S3992XA Unspecified injury of lower back, initial encounter: Secondary | ICD-10-CM | POA: Diagnosis not present

## 2017-12-22 DIAGNOSIS — N400 Enlarged prostate without lower urinary tract symptoms: Secondary | ICD-10-CM | POA: Diagnosis not present

## 2017-12-22 DIAGNOSIS — R69 Illness, unspecified: Secondary | ICD-10-CM | POA: Diagnosis not present

## 2017-12-22 DIAGNOSIS — M5431 Sciatica, right side: Secondary | ICD-10-CM | POA: Diagnosis not present

## 2017-12-22 DIAGNOSIS — Y92009 Unspecified place in unspecified non-institutional (private) residence as the place of occurrence of the external cause: Secondary | ICD-10-CM

## 2017-12-22 DIAGNOSIS — I10 Essential (primary) hypertension: Secondary | ICD-10-CM | POA: Diagnosis not present

## 2017-12-22 DIAGNOSIS — Z720 Tobacco use: Secondary | ICD-10-CM | POA: Diagnosis not present

## 2017-12-22 NOTE — ED Triage Notes (Signed)
Pt in via [facility], reports mechanical fall this morning, being seen per home PT who advised be evaluated here due to lower back pain.  Pt has been ambulatory since the fall.  Pt does report hitting head on carpeted floor, denies LOC, denies blood thinners.  Vitals WDL, NAD noted at this time.

## 2017-12-22 NOTE — Discharge Instructions (Addendum)
Your exam, back x-ray, and head CT scan, are all negative following your fall this morning. Take your home medicines as directed. Follow-up with your providers as planned.

## 2017-12-22 NOTE — ED Provider Notes (Signed)
[facility] Emergency Department Provider Note ____________________________________________  Time seen: 72  I have reviewed the triage vital signs and the nursing notes.  HISTORY  Chief Complaint  Fall and Back Pain  HPI [name] [name] is a 77 y.o. male presents to the ED via EMS from home, for evaluation of injury sustained following a mechanical fall.  Patient who lives alone, reports that he apparently fell this morning about 3 AM.  He describes getting out of bed to go to the bathroom, when he reached for his walker, it was unlocked, causing him to fall backwards.  He did fall backwards hitting his head on the carpeted floor, but denies any loss of consciousness.  He admits to some fleeting intermittent dizziness and blurry vision after he drove his car later in the morning to an automotive appointment.  He has home health PT that comes in 3 times a week, and when is therapist came in this afternoon at about 330, she was concerned about him after she learned of his accident.  He has complained of some mild low back pain, and some mild discomfort between the shoulder blades.  He denies any current headache, nausea, vomiting, dizziness, or blurred vision.  He is also denying any chest pain, shortness of breath, or other injury at this time.  He is denying the need for any oral pain medicines at this time.  Past Medical History:  Diagnosis Date  . Back pain   . Renal disorder    kidney stones  . Spinal stenosis, lumbar region, with neurogenic claudication 07/07/2014    Patient Active Problem List   Diagnosis Date Noted  . Chronic pain disorder 11/17/2017  . Unstable gait 11/17/2017  . BPH associated with nocturia 09/26/2017  . Allergic rhinitis 06/09/2017  . Tobacco use disorder 02/14/2017  . Generalized osteoarthritis of multiple sites 02/14/2017  . Bilateral lower extremity edema 08/12/2016  . Chronic right-sided low back pain with right-sided  sciatica 03/14/2016  . Chronic pain of right knee 03/14/2016  . DDD (degenerative disc disease), lumbar 07/07/2014  . DDD (degenerative disc disease), cervical 07/07/2014  . DJD (degenerative joint disease) of knee 07/07/2014  . Bilateral occipital neuralgia 07/07/2014  . Spinal stenosis, lumbar region, with neurogenic claudication 07/07/2014    Past Surgical History:  Procedure Laterality Date  . CATARACT EXTRACTION W/PHACO Left 08/22/2016   Procedure: CATARACT EXTRACTION PHACO AND INTRAOCULAR LENS PLACEMENT ([place]) Left;  Surgeon: [name], MD;  Location: [place];  Service: Ophthalmology;  Laterality: Left;  . CATARACT EXTRACTION W/PHACO Right 10/25/2016   Procedure: CATARACT EXTRACTION PHACO AND INTRAOCULAR LENS PLACEMENT ([place]) WITH ISTENT RIGHT  ;  Surgeon: [name], MD;  Location: [place];  Service: Ophthalmology;  Laterality: Right;  TOPICAL RIGHT  ISTENT   trabecular bypass stent was implanted/explanted.  . INGUINAL HERNIA REPAIR    . KNEE SURGERY    . NECK SURGERY      Prior to Admission medications   Medication Sig Start Date End Date Taking? Authorizing Provider  DULoxetine (CYMBALTA) 30 MG capsule TAKE 1 CAPSULE(30 MG) BY MOUTH DAILY 11/20/17   [name], [name], MD  nicotine (NICODERM CQ - DOSED IN MG/24 HOURS) 14 mg/24hr patch Place 1 patch (14 mg total) onto the skin daily. 09/26/17   [name], [name], MD  ondansetron (ZOFRAN ODT) 4 MG disintegrating tablet 4mg  ODT q4 hours prn nausea/vomit 11/22/17   Drenda Freeze, MD  Oxycodone HCl 10 MG TABS  Take 10 mg by mouth 2 (two) times daily.     [provider]  tamsulosin (FLOMAX) 0.4 MG CAPS capsule Take 1 capsule (0.4 mg total) by mouth daily. 09/26/17   [name], [name], MD    Allergies Patient has no known allergies.  Family History  Problem Relation Age of Onset  . Diabetes Mother   . Diabetes Brother   . Diabetes Sister     Social History Social History    Tobacco Use  . Smoking status: Current Every Day Smoker    Packs/day: 0.75    Types: Cigarettes  . Smokeless tobacco: Never Used  Substance Use Topics  . Alcohol use: No  . Drug use: No    Review of Systems  Constitutional: Negative for fever. Eyes: Positive for resolved blurry vision ENT: Negative for sore throat. Cardiovascular: Negative for chest pain. Respiratory: Negative for shortness of breath. Gastrointestinal: Negative for abdominal pain, vomiting and diarrhea. Genitourinary: Negative for dysuria. Musculoskeletal: Positive for back pain. Skin: Negative for rash. Neurological: Negative for headaches, focal weakness or numbness. Reports resolved dizziness ____________________________________________  PHYSICAL EXAM:  VITAL SIGNS: ED Triage Vitals  Enc Vitals Group     BP 12/22/17 1813 126/72     Pulse Rate 12/22/17 1813 69     Resp 12/22/17 1813 16     Temp 12/22/17 1813 98.1 F (36.7 C)     Temp Source 12/22/17 1813 Oral     SpO2 12/22/17 1813 98 %     Weight 12/22/17 1818 155 lb (70.3 kg)     Height 12/22/17 1818 5\' 9"  (1.753 m)     Head Circumference --      Peak Flow --      Pain Score 12/22/17 1818 4     Pain Loc --      Pain Edu? --      Excl. in [place]? --     Constitutional: Alert and oriented. Well appearing and in no distress. Head: Normocephalic and atraumatic. Eyes: Conjunctivae are normal. Normal extraocular movements Neck: Supple. Normal ROM without. No midline tenderness.  Cardiovascular: Normal rate, regular rhythm. Normal distal pulses. Respiratory: Normal respiratory effort. No wheezes/rales/rhonchi. Gastrointestinal: Soft and nontender. No distention. Musculoskeletal: normal spinal alignment without midline tenderness, spasm, or deformity. No tenderness to palp over the LS region or SI joints. Nontender with normal range of motion in all extremities.  Neurologic:  Normal speech and language. No gross focal neurologic deficits are  appreciated. Psychiatric: Mood and affect are normal. Patient exhibits appropriate insight and judgment. ____________________________________________   RADIOLOGY  Head CT w/o CM IMPRESSION: No evidence of acute intracranial injury.  Lumbar Spine IMPRESSION: 1. No acute fracture deformity. Stable minimal grade 1 L5-S1 anterolisthesis. 2. Stable degenerative change of the lumbar spine. ____________________________________________  PROCEDURES  Procedures ____________________________________________  INITIAL IMPRESSION / ASSESSMENT AND PLAN / ED COURSE  Geriatric patient with ED evaluation of injuries following a mechanical fall at home, this morning. He denies any LOC or current neurology complaints. He is reassured by his negative XR and CT. He is discharged to follow-up with his provider as needed. He will take his home meds as directed.  ____________________________________________  FINAL CLINICAL IMPRESSION(S) / ED DIAGNOSES  Final diagnoses:  Fall in home, initial encounter  Acute exacerbation of chronic low back pain      Darrion Macaulay, Dannielle Karvonen, PA-C 12/22/17 2052    Nena Polio, MD 12/22/17 2325

## 2017-12-22 NOTE — Telephone Encounter (Signed)
Called patient to give him results of imaging and the PT, [name], was there and stated that patient had a fall today and hit his head earlier and was complaining of blurred vision and headache. [name] stated that she would call EMS to take patient to get checked out. She stated that his vitals were good, but he is complaining of having some nausea.

## 2017-12-23 DIAGNOSIS — I10 Essential (primary) hypertension: Secondary | ICD-10-CM | POA: Diagnosis not present

## 2017-12-23 DIAGNOSIS — M150 Primary generalized (osteo)arthritis: Secondary | ICD-10-CM | POA: Diagnosis not present

## 2017-12-23 DIAGNOSIS — M6281 Muscle weakness (generalized): Secondary | ICD-10-CM | POA: Diagnosis not present

## 2017-12-23 DIAGNOSIS — Z720 Tobacco use: Secondary | ICD-10-CM | POA: Diagnosis not present

## 2017-12-23 DIAGNOSIS — G894 Chronic pain syndrome: Secondary | ICD-10-CM | POA: Diagnosis not present

## 2017-12-23 DIAGNOSIS — M48062 Spinal stenosis, lumbar region with neurogenic claudication: Secondary | ICD-10-CM | POA: Diagnosis not present

## 2017-12-23 DIAGNOSIS — M5431 Sciatica, right side: Secondary | ICD-10-CM | POA: Diagnosis not present

## 2017-12-23 DIAGNOSIS — N400 Enlarged prostate without lower urinary tract symptoms: Secondary | ICD-10-CM | POA: Diagnosis not present

## 2017-12-26 ENCOUNTER — Encounter: Payer: Self-pay | Admitting: Family Medicine

## 2017-12-26 ENCOUNTER — Ambulatory Visit (INDEPENDENT_AMBULATORY_CARE_PROVIDER_SITE_OTHER): Payer: Medicare HMO | Admitting: Family Medicine

## 2017-12-26 ENCOUNTER — Telehealth: Payer: Self-pay | Admitting: Family Medicine

## 2017-12-26 VITALS — BP 130/84 | HR 83 | Temp 98.4°F | Resp 16 | Ht 69.0 in | Wt 144.2 lb

## 2017-12-26 DIAGNOSIS — R809 Proteinuria, unspecified: Secondary | ICD-10-CM | POA: Diagnosis not present

## 2017-12-26 DIAGNOSIS — R351 Nocturia: Secondary | ICD-10-CM

## 2017-12-26 DIAGNOSIS — R2681 Unsteadiness on feet: Secondary | ICD-10-CM

## 2017-12-26 DIAGNOSIS — M159 Polyosteoarthritis, unspecified: Secondary | ICD-10-CM | POA: Diagnosis not present

## 2017-12-26 DIAGNOSIS — Z23 Encounter for immunization: Secondary | ICD-10-CM

## 2017-12-26 DIAGNOSIS — R296 Repeated falls: Secondary | ICD-10-CM | POA: Diagnosis not present

## 2017-12-26 DIAGNOSIS — M5136 Other intervertebral disc degeneration, lumbar region: Secondary | ICD-10-CM

## 2017-12-26 DIAGNOSIS — I739 Peripheral vascular disease, unspecified: Secondary | ICD-10-CM | POA: Insufficient documentation

## 2017-12-26 DIAGNOSIS — N401 Enlarged prostate with lower urinary tract symptoms: Secondary | ICD-10-CM | POA: Diagnosis not present

## 2017-12-26 MED ORDER — TAMSULOSIN HCL 0.4 MG PO CAPS: 0.4000 mg | ORAL_CAPSULE | Freq: Every day | ORAL | 2 refills | Status: DC

## 2017-12-26 NOTE — Telephone Encounter (Signed)
Copied from [place] [id number]. Topic: Quick Communication - See Telephone Encounter >> Dec 26, 2017  3:03 PM Antonieta Iba C wrote: CRM for notification. See Telephone encounter for: 12/26/17.  [name] OT w/ [facility] - Report missed visit for yesterday and today.   CB: [phone]

## 2017-12-26 NOTE — Telephone Encounter (Signed)
Noted. FYI sent to Dr. Jordan. 

## 2017-12-26 NOTE — Progress Notes (Signed)
HPI:   [name] is a 77 y.o. male, who is here today to follow on recent ER visit.   He was in the ER on 12/22/17 because fall at home. He was walking to the bathroom around 3 am He lost balance ,reached for his walker to break fall but it was unlocked. He fell backwards,hit head against floor, no LOC.  12/20/17 Lumbar X ray and head CT. Lumbar X ray: 1. No acute fracture deformity. Stable minimal grade 1 L5-S1 anterolisthesis. 2. Stable degenerative change of the lumbar spine. [place] again today. He was trying to get his walker from his truck,pulling lost balance and fell on left side,unharmed.  Head CT:No evidence of acute intracranial injury.  HH with PT 3 times per week.  BPH, nocturia 4-5 times per night. Stopped Flomax because symptoms improved. Denies dysuria,increased urinary frequency, gross hematuria,or decreased urine output. U/A negative for blood,last one 08/2017. Positive for protein.  Chronic pain "all over." On chronic opioid intake,follows with Dr [name] [name], awaiting for appt with new provider.  Arthralgias of shoulders,knees,upper and lower back,lower extremity neuropathic pain. Pain have improved since he started Cymbalta. He is on Cymbalta 30 mg daily,tolerating well.  Recently he had ABI, bilateral mild PAD. He is not interested in appt with vascular. He is not on Atorvastatin 20 mg or Aspirin.  Still smoking.  Review of Systems  Constitutional: Positive for fatigue. Negative for activity change, appetite change and fever.  Eyes: Negative for redness and visual disturbance.  Respiratory: Negative for cough, shortness of breath and wheezing.   Cardiovascular: Negative for chest pain, palpitations and leg swelling.  Gastrointestinal: Negative for abdominal pain, nausea and vomiting.  Genitourinary: Positive for frequency. Negative for decreased urine volume, dysuria and hematuria.  Musculoskeletal: Positive for arthralgias, back pain  and gait problem.  Neurological: Negative for syncope, weakness and headaches.  Psychiatric/Behavioral: Positive for sleep disturbance. Negative for confusion.      Current Outpatient Medications on File Prior to Visit  Medication Sig Dispense Refill  . DULoxetine (CYMBALTA) 30 MG capsule TAKE 1 CAPSULE(30 MG) BY MOUTH DAILY 90 capsule 1  . nicotine (NICODERM CQ - DOSED IN MG/24 HOURS) 14 mg/24hr patch Place 1 patch (14 mg total) onto the skin daily. 28 patch 0  . ondansetron (ZOFRAN ODT) 4 MG disintegrating tablet 4mg  ODT q4 hours prn nausea/vomit 10 tablet 0  . Oxycodone HCl 10 MG TABS Take 10 mg by mouth 2 (two) times daily.      No current facility-administered medications on file prior to visit.      Past Medical History:  Diagnosis Date  . Back pain   . Renal disorder    kidney stones  . Spinal stenosis, lumbar region, with neurogenic claudication 07/07/2014   No Known Allergies  Social History   Socioeconomic History  . Marital status: Widowed    Spouse name: Not on file  . Number of children: Not on file  . Years of education: Not on file  . Highest education level: Not on file  Occupational History  . Not on file  Social Needs  . Financial resource strain: Not on file  . Food insecurity:    Worry: Not on file    Inability: Not on file  . Transportation needs:    Medical: Not on file    Non-medical: Not on file  Tobacco Use  . Smoking status: Current Every Day Smoker    Packs/day: 0.75    Types:  Cigarettes  . Smokeless tobacco: Never Used  Substance and Sexual Activity  . Alcohol use: No  . Drug use: No  . Sexual activity: Not on file  Lifestyle  . Physical activity:    Days per week: Not on file    Minutes per session: Not on file  . Stress: Not on file  Relationships  . Social connections:    Talks on phone: Not on file    Gets together: Not on file    Attends religious service: Not on file    Active member of club or organization: Not on file      Attends meetings of clubs or organizations: Not on file    Relationship status: Not on file  Other Topics Concern  . Not on file  Social History Narrative  . Not on file    Vitals:   12/26/17 1350  BP: 130/84  Pulse: 83  Resp: 16  Temp: 98.4 F (36.9 C)  SpO2: 96%   Body mass index is 21.3 kg/m.  Physical Exam  Nursing note and vitals reviewed. Constitutional: He is oriented to person, place, and time. He appears well-developed. No distress.  HENT:  Head: Normocephalic and atraumatic.  Mouth/Throat: Oropharynx is clear and moist and mucous membranes are normal.  Eyes: Pupils are equal, round, and reactive to light. Conjunctivae are normal.  Left pupil slightly bigger.   Cardiovascular: Normal rate and regular rhythm.  No murmur heard. PT pulses present bilateral.  Respiratory: Effort normal and breath sounds normal. No respiratory distress.  GI: Soft. He exhibits no mass. There is no tenderness.  Musculoskeletal: He exhibits edema (Trace pitting LE edema, R>L).  Lymphadenopathy:    He has no cervical adenopathy.  Neurological: He is alert and oriented to person, place, and time. He has normal strength. No cranial nerve deficit. Gait abnormal.  Gait assisted by a cane.  Skin: Skin is warm. No rash noted. No erythema.  Psychiatric: He has a normal mood and affect.  Well groomed, good eye contact.    ASSESSMENT AND PLAN:  [name] was seen today for hospitalization follow-up.   Orders Placed This Encounter  Procedures  . Flu vaccine HIGH DOSE PF  . Microalbumin / creatinine urine ratio    Lab Results  Component Value Date   MICROALBUR 8.9 (H) 12/26/2017    Unstable gait Continue PT. Cane to help with gait. Fall precautions.   BPH associated with nocturia Flomax was helping,so recommend resuming it. Avoid liquid around 3-4 hours before bedtime. F/U in 4 months.  -     tamsulosin (FLOMAX) 0.4 MG CAPS capsule; Take 1 capsule (0.4 mg total) by  mouth daily.  Generalized osteoarthritis of multiple sites Cymbalta has helped. He agrees with increasing dose from 30 mg to 60 mg. Instructed about warning signs. F/U in 4 months,before if needed.  DDD (degenerative disc disease), lumbar Continue following with pain management.  Frequent falls Fall prevention discussed. Some of his meds can also increase his risk for falls.  Continue PT through [facility].  PAD (peripheral artery disease) (HCC)-Mild He is not interested in Aspirin or Statin medication. Smoking cessation strongly recommended.  Proteinuria, unspecified type Further recommendations will be given according to lab results.  -     Microalbumin / creatinine urine ratio  Encounter for immunization -     Flu vaccine HIGH DOSE PF      [name], MD  [facility]. [place] office.

## 2017-12-26 NOTE — Patient Instructions (Addendum)
A few things to remember from today's visit:   BPH associated with nocturia - Plan: tamsulosin (FLOMAX) 0.4 MG CAPS capsule  Generalized osteoarthritis of multiple sites  DDD (degenerative disc disease), lumbar  Unstable gait  Frequent falls  PAD (peripheral artery disease) (HCC)-Mild  Proteinuria, unspecified type - Plan: Microalbumin / creatinine urine ratio  Peripheral Vascular Disease I recommend considering taking Aspirin 81 mg and atorvastatin 20 mg daily.  Peripheral vascular disease (PVD) is a disease of the blood vessels that are not part of your heart and brain. A simple term for PVD is poor circulation. In most cases, PVD narrows the blood vessels that carry blood from your heart to the rest of your body. This can result in a decreased supply of blood to your arms, legs, and internal organs, like your stomach or kidneys. However, it most often affects a person's lower legs and feet. There are two types of PVD.  Organic PVD. This is the more common type. It is caused by damage to the structure of blood vessels.  Functional PVD. This is caused by conditions that make blood vessels contract and tighten (spasm).  Without treatment, PVD tends to get worse over time. PVD can also lead to acute ischemic limb. This is when an arm or limb suddenly has trouble getting enough blood. This is a medical emergency. Follow these instructions at home:  Take medicines only as told by your doctor.  Do not use any tobacco products, including cigarettes, chewing tobacco, or electronic cigarettes. If you need help quitting, ask your doctor.  Lose weight if you are overweight, and maintain a healthy weight as told by your doctor.  Eat a diet that is low in fat and cholesterol. If you need help, ask your doctor.  Exercise regularly. Ask your doctor for some good activities for you.  Take good care of your feet. ? Wear comfortable shoes that fit well. ? Check your feet often for any cuts  or sores. Contact a doctor if:  You have cramps in your legs while walking.  You have leg pain when you are at rest.  You have coldness in a leg or foot.  Your skin changes.  You are unable to get or have an erection (erectile dysfunction).  You have cuts or sores on your feet that are not healing. Get help right away if:  Your arm or leg turns cold and blue.  Your arms or legs become red, warm, swollen, painful, or numb.  You have chest pain or trouble breathing.  You suddenly have weakness in your face, arm, or leg.  You become very confused or you cannot speak.  You suddenly have a very bad headache.  You suddenly cannot see. This information is not intended to replace advice given to you by your health care provider. Make sure you discuss any questions you have with your health care provider. Document Released: 04/13/2009 Document Revised: 06/25/2015 Document Reviewed: 06/27/2013 Elsevier Interactive Patient Education  2017 [facility] in [place], [place] Address: [address], [place], [place] [address] Phone: [phone] Please be sure medication list is accurate. If a new problem present, please set up appointment sooner than planned today.

## 2017-12-27 ENCOUNTER — Ambulatory Visit: Payer: Medicare HMO | Admitting: Family Medicine

## 2017-12-27 DIAGNOSIS — M150 Primary generalized (osteo)arthritis: Secondary | ICD-10-CM | POA: Diagnosis not present

## 2017-12-27 DIAGNOSIS — N400 Enlarged prostate without lower urinary tract symptoms: Secondary | ICD-10-CM | POA: Diagnosis not present

## 2017-12-27 DIAGNOSIS — M5431 Sciatica, right side: Secondary | ICD-10-CM | POA: Diagnosis not present

## 2017-12-27 DIAGNOSIS — Z720 Tobacco use: Secondary | ICD-10-CM | POA: Diagnosis not present

## 2017-12-27 DIAGNOSIS — G894 Chronic pain syndrome: Secondary | ICD-10-CM | POA: Diagnosis not present

## 2017-12-27 DIAGNOSIS — M48062 Spinal stenosis, lumbar region with neurogenic claudication: Secondary | ICD-10-CM | POA: Diagnosis not present

## 2017-12-27 DIAGNOSIS — M6281 Muscle weakness (generalized): Secondary | ICD-10-CM | POA: Diagnosis not present

## 2017-12-27 DIAGNOSIS — I10 Essential (primary) hypertension: Secondary | ICD-10-CM | POA: Diagnosis not present

## 2017-12-27 LAB — MICROALBUMIN / CREATININE URINE RATIO
Creatinine,U: 58.3 mg/dL
Microalb Creat Ratio: 15.3 mg/g (ref 0.0–30.0)
Microalb, Ur: 8.9 mg/dL — ABNORMAL HIGH (ref 0.0–1.9)

## 2017-12-29 ENCOUNTER — Telehealth: Payer: Self-pay | Admitting: Family Medicine

## 2017-12-29 DIAGNOSIS — M150 Primary generalized (osteo)arthritis: Secondary | ICD-10-CM | POA: Diagnosis not present

## 2017-12-29 DIAGNOSIS — G894 Chronic pain syndrome: Secondary | ICD-10-CM | POA: Diagnosis not present

## 2017-12-29 DIAGNOSIS — M5431 Sciatica, right side: Secondary | ICD-10-CM | POA: Diagnosis not present

## 2017-12-29 DIAGNOSIS — Z720 Tobacco use: Secondary | ICD-10-CM | POA: Diagnosis not present

## 2017-12-29 DIAGNOSIS — N400 Enlarged prostate without lower urinary tract symptoms: Secondary | ICD-10-CM | POA: Diagnosis not present

## 2017-12-29 DIAGNOSIS — M6281 Muscle weakness (generalized): Secondary | ICD-10-CM | POA: Diagnosis not present

## 2017-12-29 DIAGNOSIS — I10 Essential (primary) hypertension: Secondary | ICD-10-CM | POA: Diagnosis not present

## 2017-12-29 DIAGNOSIS — M48062 Spinal stenosis, lumbar region with neurogenic claudication: Secondary | ICD-10-CM | POA: Diagnosis not present

## 2017-12-29 NOTE — Telephone Encounter (Signed)
Copied from [place] [id number]. Topic: General - Other >> Dec 29, 2017  8:27 AM Oneta Rack wrote: Caller name: Carylynn Relation to pt: PT with [facility]  Call back number: [phone]    Reason for call:  Requesting verbal orders to continue PT for 2x 4

## 2017-12-30 ENCOUNTER — Encounter: Payer: Self-pay | Admitting: Family Medicine

## 2018-01-01 NOTE — Telephone Encounter (Signed)
Is it okay to give verbal orders for PT? °

## 2018-01-01 NOTE — Telephone Encounter (Signed)
Verbal orders for PT given to [facility] as requested.

## 2018-01-01 NOTE — Telephone Encounter (Signed)
It is okay to authorize PT request. Thanks, BJ

## 2018-01-02 DIAGNOSIS — M150 Primary generalized (osteo)arthritis: Secondary | ICD-10-CM | POA: Diagnosis not present

## 2018-01-02 DIAGNOSIS — I10 Essential (primary) hypertension: Secondary | ICD-10-CM | POA: Diagnosis not present

## 2018-01-02 DIAGNOSIS — M5431 Sciatica, right side: Secondary | ICD-10-CM | POA: Diagnosis not present

## 2018-01-02 DIAGNOSIS — N400 Enlarged prostate without lower urinary tract symptoms: Secondary | ICD-10-CM | POA: Diagnosis not present

## 2018-01-02 DIAGNOSIS — M6281 Muscle weakness (generalized): Secondary | ICD-10-CM | POA: Diagnosis not present

## 2018-01-02 DIAGNOSIS — M48062 Spinal stenosis, lumbar region with neurogenic claudication: Secondary | ICD-10-CM | POA: Diagnosis not present

## 2018-01-02 DIAGNOSIS — Z720 Tobacco use: Secondary | ICD-10-CM | POA: Diagnosis not present

## 2018-01-02 DIAGNOSIS — G894 Chronic pain syndrome: Secondary | ICD-10-CM | POA: Diagnosis not present

## 2018-01-04 DIAGNOSIS — M6281 Muscle weakness (generalized): Secondary | ICD-10-CM | POA: Diagnosis not present

## 2018-01-04 DIAGNOSIS — N400 Enlarged prostate without lower urinary tract symptoms: Secondary | ICD-10-CM | POA: Diagnosis not present

## 2018-01-04 DIAGNOSIS — M150 Primary generalized (osteo)arthritis: Secondary | ICD-10-CM | POA: Diagnosis not present

## 2018-01-04 DIAGNOSIS — M48062 Spinal stenosis, lumbar region with neurogenic claudication: Secondary | ICD-10-CM | POA: Diagnosis not present

## 2018-01-04 DIAGNOSIS — Z720 Tobacco use: Secondary | ICD-10-CM | POA: Diagnosis not present

## 2018-01-04 DIAGNOSIS — M5431 Sciatica, right side: Secondary | ICD-10-CM | POA: Diagnosis not present

## 2018-01-04 DIAGNOSIS — I10 Essential (primary) hypertension: Secondary | ICD-10-CM | POA: Diagnosis not present

## 2018-01-04 DIAGNOSIS — G894 Chronic pain syndrome: Secondary | ICD-10-CM | POA: Diagnosis not present

## 2018-01-11 ENCOUNTER — Encounter: Payer: Self-pay | Admitting: Family Medicine

## 2018-01-11 ENCOUNTER — Ambulatory Visit: Payer: Self-pay

## 2018-01-11 ENCOUNTER — Ambulatory Visit (INDEPENDENT_AMBULATORY_CARE_PROVIDER_SITE_OTHER): Payer: Medicare HMO | Admitting: Family Medicine

## 2018-01-11 VITALS — BP 150/78 | HR 70 | Temp 97.6°F | Resp 16 | Wt 146.0 lb

## 2018-01-11 DIAGNOSIS — M25562 Pain in left knee: Secondary | ICD-10-CM | POA: Diagnosis not present

## 2018-01-11 DIAGNOSIS — M25561 Pain in right knee: Secondary | ICD-10-CM

## 2018-01-11 DIAGNOSIS — N400 Enlarged prostate without lower urinary tract symptoms: Secondary | ICD-10-CM | POA: Diagnosis not present

## 2018-01-11 DIAGNOSIS — Z720 Tobacco use: Secondary | ICD-10-CM | POA: Diagnosis not present

## 2018-01-11 DIAGNOSIS — I10 Essential (primary) hypertension: Secondary | ICD-10-CM | POA: Diagnosis not present

## 2018-01-11 DIAGNOSIS — G8929 Other chronic pain: Secondary | ICD-10-CM | POA: Diagnosis not present

## 2018-01-11 DIAGNOSIS — M6281 Muscle weakness (generalized): Secondary | ICD-10-CM | POA: Diagnosis not present

## 2018-01-11 DIAGNOSIS — M150 Primary generalized (osteo)arthritis: Secondary | ICD-10-CM | POA: Diagnosis not present

## 2018-01-11 DIAGNOSIS — M5431 Sciatica, right side: Secondary | ICD-10-CM | POA: Diagnosis not present

## 2018-01-11 DIAGNOSIS — M48062 Spinal stenosis, lumbar region with neurogenic claudication: Secondary | ICD-10-CM | POA: Diagnosis not present

## 2018-01-11 DIAGNOSIS — G894 Chronic pain syndrome: Secondary | ICD-10-CM | POA: Diagnosis not present

## 2018-01-11 MED ORDER — PREDNISONE 10 MG PO TABS: ORAL_TABLET | ORAL | 0 refills | Status: DC

## 2018-01-11 NOTE — Telephone Encounter (Signed)
Pt. Reports he has had joint pain and swelling x 1 week. Both knees and ankles and his left hand. Cecille Rubin - OT with [facility] is with pt. And states he does not have generalized edema. It does appear to be joint swelling. No redness or rash noted per OT. No availability at [place] today. Appointment made at [facility] office.  Reason for Disposition . [1] MODERATE pain (e.g., interferes with normal activities, limping) AND [2] present > 3 days  Answer Assessment - Initial Assessment Questions 1. LOCATION: "Where is the swelling located?"  (e.g., left, right, both knees)     Both knees and ankles and left hand 2. SIZE and DESCRIPTION: "What does the swelling look like?"  (e.g., entire knee, localized)     Entire knee 3. ONSET: "When did the swelling start?" "Does it come and go, or is it there all the time?"     Started over 1 week ago 4. PAIN: "Is there any pain?" If so, ask: "How bad is it?" (Scale 1-10; or mild, moderate, severe)     8-9 5. SETTING: "Has there been any recent work, exercise or other activity that involved that part of the body?"      No 6. AGGRAVATING FACTORS: "What makes the knee swelling worse?" (e.g., walking, climbing stairs, running)     Walking - uses a walker 7. ASSOCIATED SYMPTOMS: "Is there any pain or redness?"     No 8. OTHER SYMPTOMS: "Do you have any other symptoms?" (e.g., chest pain, difficulty breathing, fever, calf pain)      No 9. PREGNANCY: "Is there any chance you are pregnant?" "When was your last menstrual period?"       n/a  Protocols used: [facility]

## 2018-01-11 NOTE — Progress Notes (Signed)
Subjective:    Patient ID: [name] [name], male    DOB: Apr 17, 1940, 77 y.o.   MRN: [id number]  HPI  [name] is a 77 year old male who presents today with pain in his knees bilaterally that has been worsening for the past 2 days. He states that this is chronic in nature but has bothered him more over the past 2 days.   Described as aching, burning, and rated as an 8 at times Fever: No Chills: No Sweats: No Rash: No Fatigue: No Prior injury/trauma: No Surgery: Right knee surgery per patient. Patient is unable to state when this occurred but states it was in the 1970s. No records available for review Swelling: Yes with increased ambulation Prior treatment: Cymbalta and oxycodone for chronic pain disorder which provides "some" benefit.  History of generalized osteoarthritis of multiple sites present.  DDD present of lumbar, cervical, and knee.  Ambulating worsens pain and it is improved by rest.   He describes chronic pain that is described as "all over". He is followed by pain management He states that pain has improved with Cymbalta.   Review of Systems  Constitutional: Negative for chills, fatigue and fever.  Respiratory: Negative for cough, shortness of breath and wheezing.   Cardiovascular: Negative for chest pain and leg swelling.  Gastrointestinal: Negative for abdominal pain.  Musculoskeletal: Positive for arthralgias.       Knee pain  Skin: Negative for rash.   Past Medical History:  Diagnosis Date  . Back pain   . Renal disorder    kidney stones  . Spinal stenosis, lumbar region, with neurogenic claudication 07/07/2014     Social History   Socioeconomic History  . Marital status: Widowed    Spouse name: Not on file  . Number of children: Not on file  . Years of education: Not on file  . Highest education level: Not on file  Occupational History  . Not on file  Social Needs  . Financial resource strain: Not on file  . Food insecurity:   Worry: Not on file    Inability: Not on file  . Transportation needs:    Medical: Not on file    Non-medical: Not on file  Tobacco Use  . Smoking status: Current Every Day Smoker    Packs/day: 0.75    Types: Cigarettes  . Smokeless tobacco: Never Used  Substance and Sexual Activity  . Alcohol use: No  . Drug use: No  . Sexual activity: Not on file  Lifestyle  . Physical activity:    Days per week: Not on file    Minutes per session: Not on file  . Stress: Not on file  Relationships  . Social connections:    Talks on phone: Not on file    Gets together: Not on file    Attends religious service: Not on file    Active member of club or organization: Not on file    Attends meetings of clubs or organizations: Not on file    Relationship status: Not on file  . Intimate partner violence:    Fear of current or ex partner: Not on file    Emotionally abused: Not on file    Physically abused: Not on file    Forced sexual activity: Not on file  Other Topics Concern  . Not on file  Social History Narrative  . Not on file    Past Surgical History:  Procedure Laterality Date  . CATARACT EXTRACTION W/PHACO  Left 08/22/2016   Procedure: CATARACT EXTRACTION PHACO AND INTRAOCULAR LENS PLACEMENT ([place]) Left;  Surgeon: [name], MD;  Location: [place];  Service: Ophthalmology;  Laterality: Left;  . CATARACT EXTRACTION W/PHACO Right 10/25/2016   Procedure: CATARACT EXTRACTION PHACO AND INTRAOCULAR LENS PLACEMENT ([place]) WITH ISTENT RIGHT  ;  Surgeon: [name], MD;  Location: [facility];  Service: Ophthalmology;  Laterality: Right;  TOPICAL RIGHT  ISTENT   trabecular bypass stent was implanted/explanted.  . INGUINAL HERNIA REPAIR    . KNEE SURGERY    . NECK SURGERY      Family History  Problem Relation Age of Onset  . Diabetes Mother   . Diabetes Brother   . Diabetes Sister     No Known Allergies  Current Outpatient Medications on File Prior  to Visit  Medication Sig Dispense Refill  . DULoxetine (CYMBALTA) 30 MG capsule TAKE 1 CAPSULE(30 MG) BY MOUTH DAILY 90 capsule 1  . nicotine (NICODERM CQ - DOSED IN MG/24 HOURS) 14 mg/24hr patch Place 1 patch (14 mg total) onto the skin daily. 28 patch 0  . ondansetron (ZOFRAN ODT) 4 MG disintegrating tablet 4mg  ODT q4 hours prn nausea/vomit 10 tablet 0  . Oxycodone HCl 10 MG TABS Take 10 mg by mouth 2 (two) times daily.     . tamsulosin (FLOMAX) 0.4 MG CAPS capsule Take 1 capsule (0.4 mg total) by mouth daily. 90 capsule 2   No current facility-administered medications on file prior to visit.     BP (!) 150/78   Pulse 70   Temp 97.6 F (36.4 C) (Oral)   Resp 16   Wt 146 lb (66.2 kg)   SpO2 93%   BMI 21.56 kg/m       Objective:   Physical Exam Constitutional:      Comments: Ambulating with walker  Cardiovascular:     Rate and Rhythm: Normal rate and regular rhythm.     Pulses: Normal pulses.     Heart sounds: Normal heart sounds.  Pulmonary:     Effort: Pulmonary effort is normal.     Breath sounds: Normal breath sounds.  Musculoskeletal:     Comments: Knee: Normal to inspection with no erythema or obvious effusion or bony abnormalities.   Minimal tenderness with palpation present. No warmth or patellar tenderness appreciated. Good ROM in knees bilaterally with some discomfort present during exam.   Skin:    General: Skin is warm and dry.  Neurological:     Mental Status: He is alert.       Assessment & Plan:  1. Chronic pain of both knees Ambulating with walker slowly. He reports that he is able to complete activities. He will continue OT. Pain is chronic in nature and increases with activity. He is followed by pain management and will continue to follow up as scheduled. With report of intermittent swelling with activity, will trial a short course of prednisone which he stated has helped him previously.  He is interested in a referral to orthopedics for further  evaluation of knee pain. Referral provided today.   Advised him to follow up with PCP as recommended or sooner if symptoms are not improving. Also advised him to contact this office if he has not heard from the referral in 7 to 10 business days.   - predniSONE (DELTASONE) 10 MG tablet; Take 4 tablets once daily for 2 days, 3 tabs daily for 2 days, 2 tabs daily for 2 days,  1 tab daily for 2 days.  Dispense: 20 tablet; Refill: 0 - AMB referral to orthopedics  Delano Metz, FNP-C

## 2018-01-11 NOTE — Patient Instructions (Signed)
It was a pleasure to meet you today.  Please take medication with food as directed.  A referral to orthopedics has been placed for further evaluation of chronic knee pain.  Please continue your medications as Dr. [name] has prescribed and follow up with her as scheduled.   Knee Pain, Adult Many things can cause knee pain. The pain often goes away on its own with time and rest. If the pain does not go away, tests may be done to find out what is causing the pain. Follow these instructions at home: Activity  Rest your knee.  Do not do things that cause pain.  Avoid activities where both feet leave the ground at the same time (high-impact activities). Examples are running, jumping rope, and doing jumping jacks. General instructions  Take medicines only as told by your doctor.  Raise (elevate) your knee when you are resting. Make sure your knee is higher than your heart.  Sleep with a pillow under your knee.  If told, put ice on the knee: ? Put ice in a plastic bag. ? Place a towel between your skin and the bag. ? Leave the ice on for 20 minutes, 2-3 times a day.  Ask your doctor if you should wear an elastic knee support.  Lose weight if you are overweight. Being overweight can make your knee hurt more.  Do not use any tobacco products. These include cigarettes, chewing tobacco, or electronic cigarettes. If you need help quitting, ask your doctor. Smoking may slow down healing. Contact a doctor if:  The pain does not stop.  The pain changes or gets worse.  You have a fever along with knee pain.  Your knee gives out or locks up.  Your knee swells, and becomes worse. Get help right away if:  Your knee feels warm.  You cannot move your knee.  You have very bad knee pain.  You have chest pain.  You have trouble breathing. Summary  Many things can cause knee pain. The pain often goes away on its own with time and rest.  Avoid activities that put stress on your  knee. These include running and jumping rope.  Get help right away if you cannot move your knee, or if your knee feels warm, or if you have trouble breathing. This information is not intended to replace advice given to you by your health care provider. Make sure you discuss any questions you have with your health care provider. Document Released: 04/15/2008 Document Revised: 01/12/2016 Document Reviewed: 01/12/2016 Elsevier Interactive Patient Education  2017 [employer].

## 2018-01-12 DIAGNOSIS — N400 Enlarged prostate without lower urinary tract symptoms: Secondary | ICD-10-CM | POA: Diagnosis not present

## 2018-01-12 DIAGNOSIS — M150 Primary generalized (osteo)arthritis: Secondary | ICD-10-CM | POA: Diagnosis not present

## 2018-01-12 DIAGNOSIS — M5431 Sciatica, right side: Secondary | ICD-10-CM | POA: Diagnosis not present

## 2018-01-12 DIAGNOSIS — Z720 Tobacco use: Secondary | ICD-10-CM | POA: Diagnosis not present

## 2018-01-12 DIAGNOSIS — G894 Chronic pain syndrome: Secondary | ICD-10-CM | POA: Diagnosis not present

## 2018-01-12 DIAGNOSIS — M48062 Spinal stenosis, lumbar region with neurogenic claudication: Secondary | ICD-10-CM | POA: Diagnosis not present

## 2018-01-12 DIAGNOSIS — M6281 Muscle weakness (generalized): Secondary | ICD-10-CM | POA: Diagnosis not present

## 2018-01-12 DIAGNOSIS — I10 Essential (primary) hypertension: Secondary | ICD-10-CM | POA: Diagnosis not present

## 2018-01-16 DIAGNOSIS — M48062 Spinal stenosis, lumbar region with neurogenic claudication: Secondary | ICD-10-CM | POA: Diagnosis not present

## 2018-01-16 DIAGNOSIS — N400 Enlarged prostate without lower urinary tract symptoms: Secondary | ICD-10-CM | POA: Diagnosis not present

## 2018-01-16 DIAGNOSIS — G894 Chronic pain syndrome: Secondary | ICD-10-CM | POA: Diagnosis not present

## 2018-01-16 DIAGNOSIS — M6281 Muscle weakness (generalized): Secondary | ICD-10-CM | POA: Diagnosis not present

## 2018-01-16 DIAGNOSIS — Z720 Tobacco use: Secondary | ICD-10-CM | POA: Diagnosis not present

## 2018-01-16 DIAGNOSIS — M150 Primary generalized (osteo)arthritis: Secondary | ICD-10-CM | POA: Diagnosis not present

## 2018-01-16 DIAGNOSIS — I10 Essential (primary) hypertension: Secondary | ICD-10-CM | POA: Diagnosis not present

## 2018-01-16 DIAGNOSIS — M5431 Sciatica, right side: Secondary | ICD-10-CM | POA: Diagnosis not present

## 2018-01-17 ENCOUNTER — Telehealth: Payer: Self-pay | Admitting: Family Medicine

## 2018-01-17 DIAGNOSIS — G894 Chronic pain syndrome: Secondary | ICD-10-CM | POA: Diagnosis not present

## 2018-01-17 DIAGNOSIS — M5431 Sciatica, right side: Secondary | ICD-10-CM | POA: Diagnosis not present

## 2018-01-17 DIAGNOSIS — I10 Essential (primary) hypertension: Secondary | ICD-10-CM | POA: Diagnosis not present

## 2018-01-17 DIAGNOSIS — Z720 Tobacco use: Secondary | ICD-10-CM | POA: Diagnosis not present

## 2018-01-17 DIAGNOSIS — M150 Primary generalized (osteo)arthritis: Secondary | ICD-10-CM | POA: Diagnosis not present

## 2018-01-17 DIAGNOSIS — N400 Enlarged prostate without lower urinary tract symptoms: Secondary | ICD-10-CM | POA: Diagnosis not present

## 2018-01-17 DIAGNOSIS — M6281 Muscle weakness (generalized): Secondary | ICD-10-CM | POA: Diagnosis not present

## 2018-01-17 DIAGNOSIS — M48062 Spinal stenosis, lumbar region with neurogenic claudication: Secondary | ICD-10-CM | POA: Diagnosis not present

## 2018-01-17 NOTE — Telephone Encounter (Signed)
Message sent to Dr. Jordan for review. 

## 2018-01-17 NOTE — Telephone Encounter (Signed)
Please advise 

## 2018-01-17 NOTE — Telephone Encounter (Unsigned)
Copied from [place] [id number]. Topic: Quick Communication - Home Health Verbal Orders >> Jan 17, 2018 12:15 PM Percell Belt A wrote: Caller/Agency: Cecille Rubin OT w/[facility]  Callback Number: [phone]  She called to let Dr [name] know that pt leg is still hurting and the prednisone that was give did not help at all.  She would like to know what else they can try. He has some swelling, not as much.  Please advise

## 2018-01-19 NOTE — Telephone Encounter (Signed)
He has known history of lower extremity pain, neuropathic claudication, and chronic lower back pain with radiation. If pain is worse than his usual, he needs to go to the ED to evaluate for DVT or other serious problem. Appointment with chronic pain clinic is pending.  Thanks, BJ

## 2018-01-19 NOTE — Telephone Encounter (Signed)
Left message for pt to return call to clinic

## 2018-01-22 ENCOUNTER — Telehealth: Payer: Self-pay | Admitting: *Deleted

## 2018-01-22 NOTE — Telephone Encounter (Signed)
FYI sent to Dr. [name]. Copied from [place]. Topic: Quick Communication - Home Health Verbal Orders >> Jan 19, 2018  3:39 PM Rayann Heman wrote: [name] calling from [facility] home health called to report a missed visit. Please advise

## 2018-01-22 NOTE — Telephone Encounter (Signed)
Patient given recommendations per Dr. [name].

## 2018-01-25 DIAGNOSIS — M6281 Muscle weakness (generalized): Secondary | ICD-10-CM | POA: Diagnosis not present

## 2018-01-25 DIAGNOSIS — G894 Chronic pain syndrome: Secondary | ICD-10-CM | POA: Diagnosis not present

## 2018-01-25 DIAGNOSIS — M5431 Sciatica, right side: Secondary | ICD-10-CM | POA: Diagnosis not present

## 2018-01-25 DIAGNOSIS — Z720 Tobacco use: Secondary | ICD-10-CM | POA: Diagnosis not present

## 2018-01-25 DIAGNOSIS — I10 Essential (primary) hypertension: Secondary | ICD-10-CM | POA: Diagnosis not present

## 2018-01-25 DIAGNOSIS — M48062 Spinal stenosis, lumbar region with neurogenic claudication: Secondary | ICD-10-CM | POA: Diagnosis not present

## 2018-01-25 DIAGNOSIS — N400 Enlarged prostate without lower urinary tract symptoms: Secondary | ICD-10-CM | POA: Diagnosis not present

## 2018-01-25 DIAGNOSIS — M150 Primary generalized (osteo)arthritis: Secondary | ICD-10-CM | POA: Diagnosis not present

## 2018-01-26 ENCOUNTER — Telehealth: Payer: Self-pay | Admitting: Family Medicine

## 2018-01-26 NOTE — Telephone Encounter (Signed)
Copied from [place] [id number]. Topic: General - Other >> Jan 26, 2018 12:15 PM Yvette Rack wrote: Reason for CRM: PT [name] [name] from [facility] [phone] calling to get verbal orders for PT 2 times a week for 4 weeks and that it was a missed appt for today stating that pt went to go see sister in hospital in [place]

## 2018-01-27 DIAGNOSIS — Z9181 History of falling: Secondary | ICD-10-CM | POA: Diagnosis not present

## 2018-01-27 DIAGNOSIS — M150 Primary generalized (osteo)arthritis: Secondary | ICD-10-CM | POA: Diagnosis not present

## 2018-01-27 DIAGNOSIS — Z720 Tobacco use: Secondary | ICD-10-CM | POA: Diagnosis not present

## 2018-01-27 DIAGNOSIS — M6281 Muscle weakness (generalized): Secondary | ICD-10-CM | POA: Diagnosis not present

## 2018-01-27 DIAGNOSIS — N400 Enlarged prostate without lower urinary tract symptoms: Secondary | ICD-10-CM | POA: Diagnosis not present

## 2018-01-27 DIAGNOSIS — G894 Chronic pain syndrome: Secondary | ICD-10-CM | POA: Diagnosis not present

## 2018-01-27 DIAGNOSIS — M48062 Spinal stenosis, lumbar region with neurogenic claudication: Secondary | ICD-10-CM | POA: Diagnosis not present

## 2018-01-27 DIAGNOSIS — M5431 Sciatica, right side: Secondary | ICD-10-CM | POA: Diagnosis not present

## 2018-01-27 DIAGNOSIS — I10 Essential (primary) hypertension: Secondary | ICD-10-CM | POA: Diagnosis not present

## 2018-01-29 ENCOUNTER — Telehealth: Payer: Self-pay | Admitting: Family Medicine

## 2018-01-29 NOTE — Telephone Encounter (Signed)
Spoke with Cecille Rubin and gave verbal orders for OT as requested.

## 2018-01-29 NOTE — Telephone Encounter (Signed)
Spoke with [name] and gave verbal orders for PT as requested.

## 2018-01-29 NOTE — Telephone Encounter (Signed)
Copied from [place] [id number]. Topic: Quick Communication - Home Health Verbal Orders >> Jan 29, 2018  9:41 AM Valla Leaver wrote: Caller/Agency: Cecille Rubin from [facility] Number: [phone] Requesting OT/PT/Skilled Nursing/Social Work: OT Frequency: Needs re-certification

## 2018-02-01 ENCOUNTER — Ambulatory Visit (INDEPENDENT_AMBULATORY_CARE_PROVIDER_SITE_OTHER): Payer: Medicare Other

## 2018-02-01 ENCOUNTER — Ambulatory Visit (INDEPENDENT_AMBULATORY_CARE_PROVIDER_SITE_OTHER): Payer: Self-pay

## 2018-02-01 ENCOUNTER — Ambulatory Visit (INDEPENDENT_AMBULATORY_CARE_PROVIDER_SITE_OTHER): Payer: Medicare Other | Admitting: Orthopaedic Surgery

## 2018-02-01 ENCOUNTER — Encounter (INDEPENDENT_AMBULATORY_CARE_PROVIDER_SITE_OTHER): Payer: Self-pay | Admitting: Orthopaedic Surgery

## 2018-02-01 VITALS — BP 146/85 | HR 76 | Resp 16 | Ht 69.0 in | Wt 152.0 lb

## 2018-02-01 DIAGNOSIS — M25561 Pain in right knee: Secondary | ICD-10-CM | POA: Diagnosis not present

## 2018-02-01 DIAGNOSIS — M25562 Pain in left knee: Secondary | ICD-10-CM

## 2018-02-01 DIAGNOSIS — Z9181 History of falling: Secondary | ICD-10-CM | POA: Diagnosis not present

## 2018-02-01 DIAGNOSIS — M6281 Muscle weakness (generalized): Secondary | ICD-10-CM | POA: Diagnosis not present

## 2018-02-01 DIAGNOSIS — G8929 Other chronic pain: Secondary | ICD-10-CM | POA: Diagnosis not present

## 2018-02-01 DIAGNOSIS — I10 Essential (primary) hypertension: Secondary | ICD-10-CM | POA: Diagnosis not present

## 2018-02-01 DIAGNOSIS — G894 Chronic pain syndrome: Secondary | ICD-10-CM | POA: Diagnosis not present

## 2018-02-01 DIAGNOSIS — M150 Primary generalized (osteo)arthritis: Secondary | ICD-10-CM | POA: Diagnosis not present

## 2018-02-01 DIAGNOSIS — Z720 Tobacco use: Secondary | ICD-10-CM | POA: Diagnosis not present

## 2018-02-01 DIAGNOSIS — M5431 Sciatica, right side: Secondary | ICD-10-CM | POA: Diagnosis not present

## 2018-02-01 DIAGNOSIS — M48062 Spinal stenosis, lumbar region with neurogenic claudication: Secondary | ICD-10-CM | POA: Diagnosis not present

## 2018-02-01 MED ORDER — LIDOCAINE HCL 1 % IJ SOLN
2.0000 mL | INTRAMUSCULAR | Status: AC | PRN
  Administered 2018-02-01: 2 mL

## 2018-02-01 MED ORDER — METHYLPREDNISOLONE ACETATE 40 MG/ML IJ SUSP
80.0000 mg | INTRAMUSCULAR | Status: AC | PRN
  Administered 2018-02-01: 80 mg

## 2018-02-01 MED ORDER — BUPIVACAINE HCL 0.5 % IJ SOLN
2.0000 mL | INTRAMUSCULAR | Status: AC | PRN
  Administered 2018-02-01: 2 mL via INTRA_ARTICULAR

## 2018-02-01 NOTE — Progress Notes (Signed)
Office Visit Note   Patient: [name] [name]           Date of Birth: [birth year]           MRN: [id number] Visit Date: 02/01/2018              Requested by: Delano Metz, [address], [place] [address] PCP: [name], [name], MD   Assessment & Plan: Visit Diagnoses:  1. Chronic pain of both knees     Plan: Bilateral knee osteoarthritis greater on the right.  Long discussion regarding diagnosis and treatment options.  Will inject the right knee with cortisone and monitor response.  Office 2 weeks.  Consider injecting left knee at that time  Follow-Up Instructions: Return in about 2 weeks (around 02/15/2018).   Orders:  Orders Placed This Encounter  Procedures  . Large Joint Inj: R knee  . XR KNEE 3 VIEW RIGHT  . XR KNEE 3 VIEW LEFT   No orders of the defined types were placed in this encounter.     Procedures: Large Joint Inj: R knee on 02/01/2018 3:41 PM Indications: pain and diagnostic evaluation Details: 25 G 1.5 in needle, anteromedial approach  Arthrogram: No  Medications: 2 mL lidocaine 1 %; 2 mL bupivacaine 0.5 %; 80 mg methylPREDNISolone acetate 40 MG/ML Procedure, treatment alternatives, risks and benefits explained, specific risks discussed. Consent was given by the patient. Immediately prior to procedure a time out was called to verify the correct patient, procedure, equipment, support staff and site/side marked as required. Patient was prepped and draped in the usual sterile fashion.       Clinical Data: No additional findings.   Subjective: Chief Complaint  Patient presents with  . Right Knee - Pain  . Left Knee - Pain  [name] [name] presents in the office today for bilateral knee pain. Patient denies any falls or injuries. Patient states both knees will "give out" on him and both knees swell.  [name] [name] relates a long history of bilateral knee pain.  [name] [name] also has some issues with his back.  [name] [name] uses a rolling walker  because of balance and because of the issues with his back and his knees.  [name] [name] has a diagnosis of osteoarthritis.  [name] [name] thinks [name] [name] may have had cortisone in the past.  His knees do swell and sometimes feels if they might want to give way.  [name] [name] has had a prior infection of the right knee many years ago requiring irrigation and debridement.  No history of injury or trauma. HPI  Review of Systems  Constitutional: Negative for chills and fatigue.  HENT: Negative for sneezing, sore throat and tinnitus.   Eyes: Negative for photophobia and redness.  Respiratory: Negative for shortness of breath.   Cardiovascular: Negative for chest pain.  Gastrointestinal: Negative for abdominal pain, constipation and diarrhea.  Endocrine: Negative for polyuria.  Genitourinary: Negative for urgency.  Musculoskeletal: Positive for gait problem and joint swelling.  Skin: Negative for rash.  Allergic/Immunologic: Negative for immunocompromised state.  Neurological: Negative for dizziness, numbness and headaches.  Hematological: Does not bruise/bleed easily.  Psychiatric/Behavioral: Negative for confusion. The patient is not nervous/anxious.      Objective: Vital Signs: BP (!) 146/85 (BP Location: Right Arm, Patient Position: Sitting, Cuff Size: Normal)   Pulse 76   Resp 16   Ht 5\' 9"  (1.753 m)   Wt 152 lb (68.9 kg)   BMI 22.45 kg/m   Physical Exam Constitutional:  Appearance: [name] [name] is well-developed.  Eyes:     Pupils: Pupils are equal, round, and reactive to light.  Pulmonary:     Effort: Pulmonary effort is normal.  Skin:    General: Skin is warm and dry.  Neurological:     Mental Status: [name] [name] is alert and oriented to person, place, and time.  Psychiatric:        Behavior: Behavior normal.     Ortho Exam awake alert and oriented x3.  Comfortable sitting.  Lacks about 6 degrees to full knee extension on the right.  No effusion.  Predominately medial joint pain.  Flexed over 105 degrees without  instability.  Some patellar crepitation.  Skin intact. Good pulses distally.  Left knee with full extension.  No effusion.  Mild increased varus.  No calf pain.  Predominant medial joint pain.  Minimal patellar crepitation.  No pain laterally Specialty Comments:  No specialty comments available.  Imaging: Xr Knee 3 View Left  Result Date: 02/01/2018 Films of the left knee were obtained in 3 projections standing.  There are tricompartmental degenerative changes to a lesser extent than on the right knee.  There is narrowing of the medial compartment with very slight varus.  Chondral sclerosis on both sides of the joint medially.  Lateral tilt of the patella with patellofemoral arthritis and osteophytes.  Films are consistent with moderate osteoarthritis  Xr Knee 3 View Right  Result Date: 02/01/2018 Films of the right knee were obtained in 3 projections standing normal alignment.  There is decrease in the medial joint space.  No acute changes or ectopic calcification.  There are small osteophytes in both medial lateral compartments considerable patellofemoral arthritis with large osteophytes particularly laterally.  Films are consistent with advanced osteoarthritis    PMFS History: Patient Active Problem List   Diagnosis Date Noted  . PAD (peripheral artery disease) (HCC)-Mild 12/26/2017  . Chronic pain disorder 11/17/2017  . Unstable gait 11/17/2017  . BPH associated with nocturia 09/26/2017  . Allergic rhinitis 06/09/2017  . Tobacco use disorder 02/14/2017  . Generalized osteoarthritis of multiple sites 02/14/2017  . Bilateral lower extremity edema 08/12/2016  . Chronic right-sided low back pain with right-sided sciatica 03/14/2016  . Chronic pain of right knee 03/14/2016  . DDD (degenerative disc disease), lumbar 07/07/2014  . DDD (degenerative disc disease), cervical 07/07/2014  . DJD (degenerative joint disease) of knee 07/07/2014  . Bilateral occipital neuralgia 07/07/2014  .  Spinal stenosis, lumbar region, with neurogenic claudication 07/07/2014   Past Medical History:  Diagnosis Date  . Back pain   . Renal disorder    kidney stones  . Spinal stenosis, lumbar region, with neurogenic claudication 07/07/2014    Family History  Problem Relation Age of Onset  . Diabetes Mother   . Diabetes Brother   . Diabetes Sister     Past Surgical History:  Procedure Laterality Date  . CATARACT EXTRACTION W/PHACO Left 08/22/2016   Procedure: CATARACT EXTRACTION PHACO AND INTRAOCULAR LENS PLACEMENT ([place]) Left;  Surgeon: [name], MD;  Location: [facility];  Service: Ophthalmology;  Laterality: Left;  . CATARACT EXTRACTION W/PHACO Right 10/25/2016   Procedure: CATARACT EXTRACTION PHACO AND INTRAOCULAR LENS PLACEMENT ([place]) WITH ISTENT RIGHT  ;  Surgeon: [name], MD;  Location: [facility];  Service: Ophthalmology;  Laterality: Right;  TOPICAL RIGHT  ISTENT   trabecular bypass stent was implanted/explanted.  . INGUINAL HERNIA REPAIR    . KNEE SURGERY Right   . NECK  SURGERY     Social History   Occupational History  . Not on file  Tobacco Use  . Smoking status: Current Every Day Smoker    Packs/day: 0.75    Types: Cigarettes  . Smokeless tobacco: Never Used  Substance and Sexual Activity  . Alcohol use: No  . Drug use: Never  . Sexual activity: Not on file

## 2018-02-03 DIAGNOSIS — I10 Essential (primary) hypertension: Secondary | ICD-10-CM | POA: Diagnosis not present

## 2018-02-03 DIAGNOSIS — G894 Chronic pain syndrome: Secondary | ICD-10-CM | POA: Diagnosis not present

## 2018-02-03 DIAGNOSIS — Z720 Tobacco use: Secondary | ICD-10-CM | POA: Diagnosis not present

## 2018-02-03 DIAGNOSIS — M48062 Spinal stenosis, lumbar region with neurogenic claudication: Secondary | ICD-10-CM | POA: Diagnosis not present

## 2018-02-03 DIAGNOSIS — M150 Primary generalized (osteo)arthritis: Secondary | ICD-10-CM | POA: Diagnosis not present

## 2018-02-03 DIAGNOSIS — Z9181 History of falling: Secondary | ICD-10-CM | POA: Diagnosis not present

## 2018-02-03 DIAGNOSIS — M5431 Sciatica, right side: Secondary | ICD-10-CM | POA: Diagnosis not present

## 2018-02-03 DIAGNOSIS — M6281 Muscle weakness (generalized): Secondary | ICD-10-CM | POA: Diagnosis not present

## 2018-02-05 ENCOUNTER — Telehealth: Payer: Self-pay | Admitting: Family Medicine

## 2018-02-05 ENCOUNTER — Ambulatory Visit (INDEPENDENT_AMBULATORY_CARE_PROVIDER_SITE_OTHER): Payer: Self-pay | Admitting: Family Medicine

## 2018-02-05 ENCOUNTER — Encounter: Payer: Self-pay | Admitting: Family Medicine

## 2018-02-05 VITALS — BP 132/80 | HR 78 | Temp 98.6°F | Resp 16 | Ht 69.0 in | Wt 149.4 lb

## 2018-02-05 DIAGNOSIS — F17200 Nicotine dependence, unspecified, uncomplicated: Secondary | ICD-10-CM

## 2018-02-05 DIAGNOSIS — M25561 Pain in right knee: Secondary | ICD-10-CM

## 2018-02-05 DIAGNOSIS — R350 Frequency of micturition: Secondary | ICD-10-CM

## 2018-02-05 DIAGNOSIS — I739 Peripheral vascular disease, unspecified: Secondary | ICD-10-CM

## 2018-02-05 DIAGNOSIS — M159 Polyosteoarthritis, unspecified: Secondary | ICD-10-CM

## 2018-02-05 DIAGNOSIS — G8929 Other chronic pain: Secondary | ICD-10-CM

## 2018-02-05 DIAGNOSIS — R251 Tremor, unspecified: Secondary | ICD-10-CM

## 2018-02-05 MED ORDER — SOLIFENACIN SUCCINATE 5 MG PO TABS: 5.0000 mg | ORAL_TABLET | Freq: Every day | ORAL | 2 refills | Status: DC

## 2018-02-05 MED ORDER — ATORVASTATIN CALCIUM 10 MG PO TABS: 10.0000 mg | ORAL_TABLET | Freq: Every day | ORAL | 3 refills | Status: DC

## 2018-02-05 MED ORDER — LIDOCAINE 0.5 % EX GEL: 1.0000 "application " | Freq: Three times a day (TID) | CUTANEOUS | 1 refills | Status: DC | PRN

## 2018-02-05 MED ORDER — DULOXETINE HCL 60 MG PO CPEP: 60.0000 mg | ORAL_CAPSULE | Freq: Every day | ORAL | 1 refills | Status: DC

## 2018-02-05 NOTE — Assessment & Plan Note (Signed)
Cymbalta has helped, he agrees with increasing dose from 30 mg to 60 mg. Fall precautions discussed.

## 2018-02-05 NOTE — Assessment & Plan Note (Addendum)
Adverse effects of tobacco use discussed. Refused Prevnar 13. He is not interested in smoking cessation.

## 2018-02-05 NOTE — Patient Instructions (Addendum)
A few things to remember from today's visit:   Generalized osteoarthritis of multiple sites - Plan: DULoxetine (CYMBALTA) 60 MG capsule  Urinary frequency - Plan: solifenacin (VESICARE) 5 MG tablet  Chronic pain of right knee - Plan: Lidocaine 0.5 % GEL  Tobacco use disorder  PAD (peripheral artery disease) (HCC)-Mild - Plan: atorvastatin (LIPITOR) 10 MG tablet  Continue baby aspirin daily. Strongly recommend smoking cessation. Atorvastatin 10 mg daily started today.   For the urine no changes in Flomax.  Vesicare 5 mg daily.  Keep appointment with orthopedist. Topical lidocaine may help with knee pain.  Cymbalta increased from 30 mg to 60 mg.   Please be sure medication list is accurate. If a new problem present, please set up appointment sooner than planned today.

## 2018-02-05 NOTE — Assessment & Plan Note (Signed)
Improved with intra-articular steroid injection. He can also try topical lidocaine, some side effect discussed. Recommend keeping appointment with orthopedist. Fall precautions.

## 2018-02-05 NOTE — Telephone Encounter (Signed)
Message sent to Dr. Jordan for review. 

## 2018-02-05 NOTE — Telephone Encounter (Signed)
Copied from [place] [id number]. Topic: Quick Communication - See Telephone Encounter >> Feb 05, 2018  4:55 PM Rutherford Nail, NT wrote: CRM for notification. See Telephone encounter for: 02/05/18. Lannette Donath with [employer] and states that on the Lidocaine 0.5 % GEL, he does not see that there is an option of 0.5% to apply topically. States that there is a 2%, 3%, 4%, and 5%. Please advise. Requesting clarification. CB#: [phone]

## 2018-02-05 NOTE — Assessment & Plan Note (Signed)
We discussed possible etiologies. History of BPH, Flomax has not helped with urinary frequency. He agrees with adding Vesicare 5 mg daily. Follow-up in 2 months, before if needed.

## 2018-02-05 NOTE — Progress Notes (Addendum)
HPI:  Chief Complaint  Patient presents with  . Follow-up    [name] is a 78 y.o. male, who is here today for chronic problems management.   He was last seen on 12/26/2016.  Generalized OA (shoulders,hips,and knees), back,and cervical pain. Cymbalta 30 mg has help "a little",still pain is severe sometimes. Morning stiffness. Pain exacerbated by prolonged walking/standing and by movement after prolonged rest.  He has tolerated medication well,denies side effects.    Since her last visit he has been evaluated for bilateral knee pain, he saw orthopedist, Dr. Durward Fortes (02/01/2018). Intra-articular knee injection has helped with right knee pain, mild soreness. Left knee feels "weak."  Urinary frequency: Hx of BPH. Last OV he resumed Flomax 0.4 mg daily, it has not helped with nocturia. He is getting up every 2 hours at night.  Denies dysuria,gross hematuria,or decreased urine output.  + Smoker.  Lab Results  Component Value Date   HGBA1C 5.4 09/26/2017   Lab Results  Component Value Date   PSA 0.59 09/26/2017     "Hand shaking" for a while now. Right hand tremor when he is trying to reach and pick up something from the floor. He denies problem when he is writing or utilizing spoon/fork. He has history of cervical pain with radiculopathy.  Problem seems to be stable.  PAD: He is on Aspirin 81 mg. He was not interested in statin med last visit. ABI LE's on 12/01/17: Right: Resting right ankle-brachial index indicates mild right lower extremity arterial disease. The right toe-brachial index is abnormal. Left: Resting left ankle-brachial index indicates mild left lower extremity arterial disease. The left toe-brachial index is abnormal.  Probable tibial vessel disease, bilaterally.    Review of Systems  Constitutional: Positive for fatigue. Negative for activity change, appetite change and fever.  HENT: Negative for nosebleeds, sore  throat and trouble swallowing.   Eyes: Negative for redness and visual disturbance.  Respiratory: Negative for cough, shortness of breath and wheezing.   Cardiovascular: Negative for chest pain, palpitations and leg swelling.  Gastrointestinal: Negative for abdominal pain, nausea and vomiting.  Genitourinary: Positive for frequency. Negative for decreased urine volume, dysuria and hematuria.  Musculoskeletal: Positive for arthralgias, back pain and gait problem. Negative for joint swelling.  Neurological: Negative for syncope, weakness and headaches.  Psychiatric/Behavioral: Positive for sleep disturbance. Negative for confusion. The patient is nervous/anxious.      Current Outpatient Medications on File Prior to Visit  Medication Sig Dispense Refill  . Oxycodone HCl 10 MG TABS Take 10 mg by mouth 2 (two) times daily.     . tamsulosin (FLOMAX) 0.4 MG CAPS capsule Take 1 capsule (0.4 mg total) by mouth daily. 90 capsule 2  . nicotine (NICODERM CQ - DOSED IN MG/24 HOURS) 14 mg/24hr patch Place 1 patch (14 mg total) onto the skin daily. (Patient not taking: Reported on 02/05/2018) 28 patch 0   No current facility-administered medications on file prior to visit.      Past Medical History:  Diagnosis Date  . Back pain   . Renal disorder    kidney stones  . Spinal stenosis, lumbar region, with neurogenic claudication 07/07/2014   No Known Allergies  Social History   Socioeconomic History  . Marital status: Widowed    Spouse name: Not on file  . Number of children: Not on file  . Years of education: Not on file  . Highest education level: Not on file  Occupational History  .  Not on file  Social Needs  . Financial resource strain: Not on file  . Food insecurity:    Worry: Not on file    Inability: Not on file  . Transportation needs:    Medical: Not on file    Non-medical: Not on file  Tobacco Use  . Smoking status: Current Every Day Smoker    Packs/day: 0.75    Types:  Cigarettes  . Smokeless tobacco: Never Used  Substance and Sexual Activity  . Alcohol use: No  . Drug use: Never  . Sexual activity: Not on file  Lifestyle  . Physical activity:    Days per week: Not on file    Minutes per session: Not on file  . Stress: Not on file  Relationships  . Social connections:    Talks on phone: Not on file    Gets together: Not on file    Attends religious service: Not on file    Active member of club or organization: Not on file    Attends meetings of clubs or organizations: Not on file    Relationship status: Not on file  Other Topics Concern  . Not on file  Social History Narrative  . Not on file    Vitals:   02/05/18 1354  BP: 132/80  Pulse: 78  Resp: 16  Temp: 98.6 F (37 C)  SpO2: 98%   Body mass index is 22.06 kg/m.    Physical Exam  Nursing note and vitals reviewed. Constitutional: He is oriented to person, place, and time. He appears well-developed and well-nourished. No distress.  HENT:  Head: Normocephalic and atraumatic.  Mouth/Throat: Oropharynx is clear and moist and mucous membranes are normal.  Eyes: Conjunctivae are normal.  Cardiovascular: Normal rate and regular rhythm.  No murmur heard. DP and PT present bilateral.  Respiratory: Effort normal and breath sounds normal. No respiratory distress.  GI: Soft. He exhibits no mass. There is no abdominal tenderness.  Musculoskeletal:        General: No edema.     Right knee: He exhibits decreased range of motion. He exhibits no effusion.     Thoracic back: He exhibits tenderness.     Lumbar back: He exhibits decreased range of motion and tenderness.  Neurological: He is alert and oriented to person, place, and time. He has normal strength. No cranial nerve deficit. Gait abnormal.  Unstable gait assisted with a cane. Pronator drift negative. No tremor appreciated.  Skin: Skin is warm. No rash noted. No erythema.  Psychiatric: His mood appears anxious.  Well groomed,  good eye contact.     ASSESSMENT AND PLAN:   [name] was seen today for follow-up.  No orders of the defined types were placed in this encounter.   Generalized osteoarthritis of multiple sites Cymbalta has helped, he agrees with increasing dose from 30 mg to 60 mg. Fall precautions discussed.   Tobacco use disorder Adverse effects of tobacco use discussed. Refused Prevnar 13. He is not interested in smoking cessation.  Chronic pain of right knee Improved with intra-articular steroid injection. He can also try topical lidocaine, some side effect discussed. Recommend keeping appointment with orthopedist. Fall precautions.  PAD (peripheral artery disease) (HCC)-Mild Today he agrees with adding statin medication, atorvastatin 10 mg daily. Continue Aspirin 81 mg daily. Strongly recommend smoking cessation.  Urinary frequency We discussed possible etiologies. History of BPH, Flomax has not helped with urinary frequency. He agrees with adding Vesicare 5 mg daily. Follow-up  in 2 months, before if needed.  Tremor, unspecified We discussed possible etiologies. It is not present today. It seems to be stable. We will hold on lab work until next [place].   Return in about 2 months (around 04/06/2018) for pain (OA),urinary frequency.    [name] Litt G. [name], MD  [facility]. [place] office.

## 2018-02-05 NOTE — Assessment & Plan Note (Signed)
Today he agrees with adding statin medication, atorvastatin 10 mg daily. Continue Aspirin 81 mg daily. Strongly recommend smoking cessation.

## 2018-02-06 MED ORDER — LIDOCAINE 5 % EX OINT: 1.0000 "application " | TOPICAL_OINTMENT | Freq: Two times a day (BID) | CUTANEOUS | 2 refills | Status: DC | PRN

## 2018-02-06 NOTE — Telephone Encounter (Signed)
New prescription sent, lidocaine 5% to apply twice daily as needed. Thanks, BJ

## 2018-02-08 ENCOUNTER — Telehealth: Payer: Self-pay

## 2018-02-08 ENCOUNTER — Encounter: Payer: Self-pay | Admitting: Family Medicine

## 2018-02-08 NOTE — Telephone Encounter (Signed)
Copied from [place] [id number]. Topic: Quick Communication - Home Health Verbal Orders >> Feb 07, 2018  5:22 PM Ivar Drape wrote: Caller/Agency: Lucendia Herrlich PT w/[facility] Number:  [phone]  She just wanted to report the patient missed his physical therapy appt today

## 2018-02-09 ENCOUNTER — Telehealth: Payer: Self-pay | Admitting: *Deleted

## 2018-02-09 ENCOUNTER — Telehealth: Payer: Self-pay | Admitting: Family Medicine

## 2018-02-09 DIAGNOSIS — M5431 Sciatica, right side: Secondary | ICD-10-CM | POA: Diagnosis not present

## 2018-02-09 DIAGNOSIS — M48062 Spinal stenosis, lumbar region with neurogenic claudication: Secondary | ICD-10-CM | POA: Diagnosis not present

## 2018-02-09 DIAGNOSIS — Z720 Tobacco use: Secondary | ICD-10-CM | POA: Diagnosis not present

## 2018-02-09 DIAGNOSIS — Z9181 History of falling: Secondary | ICD-10-CM | POA: Diagnosis not present

## 2018-02-09 DIAGNOSIS — I10 Essential (primary) hypertension: Secondary | ICD-10-CM | POA: Diagnosis not present

## 2018-02-09 DIAGNOSIS — M150 Primary generalized (osteo)arthritis: Secondary | ICD-10-CM | POA: Diagnosis not present

## 2018-02-09 DIAGNOSIS — M6281 Muscle weakness (generalized): Secondary | ICD-10-CM | POA: Diagnosis not present

## 2018-02-09 DIAGNOSIS — G894 Chronic pain syndrome: Secondary | ICD-10-CM | POA: Diagnosis not present

## 2018-02-09 NOTE — Telephone Encounter (Signed)
Noted. FYI sent to Dr. Jordan. 

## 2018-02-09 NOTE — Telephone Encounter (Signed)
Missed visit noted. FYI sent to Dr. [name].  Copied from [place] [id number]. Topic: General - Other >> Feb 09, 2018  3:21 PM Yvette Rack wrote: Reason for CRM: [name] with [facility] stated patient had a second missed visit for the week. [name] stated the first missed visit was on Wednesday. Cb# [phone]

## 2018-02-09 NOTE — Telephone Encounter (Signed)
Copied from [place] [id number]. Topic: Quick Communication - Home Health Verbal Orders >> Feb 09, 2018  1:18 PM Leward Quan A wrote: Caller/Agency: [facility] Maxin Callback Number: [phone] Requesting OT/PT/Skilled Nursing/Social Work: OT Frequency: Requesting order far Rx for an elongated 3 in 1 bedside comode  Send to [facility] Fax# [phone]

## 2018-02-14 DIAGNOSIS — G894 Chronic pain syndrome: Secondary | ICD-10-CM | POA: Diagnosis not present

## 2018-02-14 DIAGNOSIS — M5431 Sciatica, right side: Secondary | ICD-10-CM | POA: Diagnosis not present

## 2018-02-14 DIAGNOSIS — Z9181 History of falling: Secondary | ICD-10-CM | POA: Diagnosis not present

## 2018-02-14 DIAGNOSIS — Z720 Tobacco use: Secondary | ICD-10-CM | POA: Diagnosis not present

## 2018-02-14 DIAGNOSIS — M48062 Spinal stenosis, lumbar region with neurogenic claudication: Secondary | ICD-10-CM | POA: Diagnosis not present

## 2018-02-14 DIAGNOSIS — M150 Primary generalized (osteo)arthritis: Secondary | ICD-10-CM | POA: Diagnosis not present

## 2018-02-14 DIAGNOSIS — M6281 Muscle weakness (generalized): Secondary | ICD-10-CM | POA: Diagnosis not present

## 2018-02-14 DIAGNOSIS — I10 Essential (primary) hypertension: Secondary | ICD-10-CM | POA: Diagnosis not present

## 2018-02-15 DIAGNOSIS — G894 Chronic pain syndrome: Secondary | ICD-10-CM | POA: Diagnosis not present

## 2018-02-15 DIAGNOSIS — Z9181 History of falling: Secondary | ICD-10-CM | POA: Diagnosis not present

## 2018-02-15 DIAGNOSIS — M5431 Sciatica, right side: Secondary | ICD-10-CM | POA: Diagnosis not present

## 2018-02-15 DIAGNOSIS — I10 Essential (primary) hypertension: Secondary | ICD-10-CM | POA: Diagnosis not present

## 2018-02-15 DIAGNOSIS — M150 Primary generalized (osteo)arthritis: Secondary | ICD-10-CM | POA: Diagnosis not present

## 2018-02-15 DIAGNOSIS — M6281 Muscle weakness (generalized): Secondary | ICD-10-CM | POA: Diagnosis not present

## 2018-02-15 DIAGNOSIS — Z720 Tobacco use: Secondary | ICD-10-CM | POA: Diagnosis not present

## 2018-02-15 DIAGNOSIS — M48062 Spinal stenosis, lumbar region with neurogenic claudication: Secondary | ICD-10-CM | POA: Diagnosis not present

## 2018-02-16 ENCOUNTER — Telehealth: Payer: Self-pay | Admitting: *Deleted

## 2018-02-16 NOTE — Telephone Encounter (Signed)
Noted. FYI sent to Dr. [name] about missed appointment.  Copied from [place]. Topic: General - Other >> Feb 16, 2018  1:03 PM Oneta Rack wrote: [name] [name] Human name: [name]  Relation to pt: PT from [facility] Call back number: [phone]    Reason for call:  Physical Therapist wanted to report today missed appointment [facility] to 02/19/2018

## 2018-02-19 ENCOUNTER — Emergency Department (HOSPITAL_COMMUNITY)
Admission: EM | Admit: 2018-02-19 | Discharge: 2018-02-19 | Disposition: A | Discharge status: Home / Self Care | Payer: Medicare Other | Attending: Emergency Medicine | Admitting: Emergency Medicine

## 2018-02-19 ENCOUNTER — Emergency Department (HOSPITAL_COMMUNITY): Payer: Medicare Other

## 2018-02-19 ENCOUNTER — Ambulatory Visit (INDEPENDENT_AMBULATORY_CARE_PROVIDER_SITE_OTHER): Payer: Medicare Other | Admitting: Orthopaedic Surgery

## 2018-02-19 ENCOUNTER — Other Ambulatory Visit: Payer: Self-pay

## 2018-02-19 ENCOUNTER — Encounter (HOSPITAL_COMMUNITY): Payer: Self-pay | Admitting: Emergency Medicine

## 2018-02-19 DIAGNOSIS — W19XXXA Unspecified fall, initial encounter: Secondary | ICD-10-CM | POA: Diagnosis not present

## 2018-02-19 DIAGNOSIS — Y9389 Activity, other specified: Secondary | ICD-10-CM | POA: Insufficient documentation

## 2018-02-19 DIAGNOSIS — M79602 Pain in left arm: Secondary | ICD-10-CM | POA: Diagnosis not present

## 2018-02-19 DIAGNOSIS — Z79899 Other long term (current) drug therapy: Secondary | ICD-10-CM | POA: Diagnosis not present

## 2018-02-19 DIAGNOSIS — M25512 Pain in left shoulder: Secondary | ICD-10-CM | POA: Diagnosis not present

## 2018-02-19 DIAGNOSIS — S199XXA Unspecified injury of neck, initial encounter: Secondary | ICD-10-CM | POA: Diagnosis not present

## 2018-02-19 DIAGNOSIS — M542 Cervicalgia: Secondary | ICD-10-CM | POA: Diagnosis not present

## 2018-02-19 DIAGNOSIS — S4992XA Unspecified injury of left shoulder and upper arm, initial encounter: Secondary | ICD-10-CM | POA: Diagnosis not present

## 2018-02-19 DIAGNOSIS — M25552 Pain in left hip: Secondary | ICD-10-CM | POA: Insufficient documentation

## 2018-02-19 DIAGNOSIS — W010XXA Fall on same level from slipping, tripping and stumbling without subsequent striking against object, initial encounter: Secondary | ICD-10-CM | POA: Diagnosis not present

## 2018-02-19 DIAGNOSIS — S79912A Unspecified injury of left hip, initial encounter: Secondary | ICD-10-CM | POA: Diagnosis not present

## 2018-02-19 DIAGNOSIS — Y92531 Health care provider office as the place of occurrence of the external cause: Secondary | ICD-10-CM | POA: Diagnosis not present

## 2018-02-19 DIAGNOSIS — Y999 Unspecified external cause status: Secondary | ICD-10-CM | POA: Diagnosis not present

## 2018-02-19 DIAGNOSIS — S0003XA Contusion of scalp, initial encounter: Secondary | ICD-10-CM | POA: Diagnosis not present

## 2018-02-19 DIAGNOSIS — F17210 Nicotine dependence, cigarettes, uncomplicated: Secondary | ICD-10-CM | POA: Insufficient documentation

## 2018-02-19 DIAGNOSIS — R51 Headache: Secondary | ICD-10-CM | POA: Diagnosis not present

## 2018-02-19 DIAGNOSIS — I10 Essential (primary) hypertension: Secondary | ICD-10-CM | POA: Diagnosis not present

## 2018-02-19 DIAGNOSIS — R030 Elevated blood-pressure reading, without diagnosis of hypertension: Secondary | ICD-10-CM

## 2018-02-19 DIAGNOSIS — S0990XA Unspecified injury of head, initial encounter: Secondary | ICD-10-CM | POA: Diagnosis not present

## 2018-02-19 MED ORDER — MORPHINE SULFATE (PF) 4 MG/ML IV SOLN
4.0000 mg | Freq: Once | INTRAVENOUS | Status: AC
  Administered 2018-02-19: 4 mg via INTRAMUSCULAR
  Filled 2018-02-19: qty 1

## 2018-02-19 NOTE — ED Notes (Signed)
Patient verbalizes understanding of discharge instructions. Opportunity for questioning and answers were provided. Armband removed by staff, pt discharged from ED ambulatory to home.  

## 2018-02-19 NOTE — ED Provider Notes (Signed)
[facility] EMERGENCY DEPARTMENT Provider Note   CSN: [id number] Arrival date & time: 02/19/18  1610     History   Chief Comp Fall  HPI [name] [name] is a 78 y.o. male with past medical history significant for chronic pain, hypertension, unstable gait, tobacco use, degenerative disc disease who presents for evaluation of fall.  Patient states he was at his orthopedic office to obtain a brace for his chronic right knee pain when he leaned to the left and was not able to catch himself and he proceeded to fall.  Patient states he has left-sided head pain, left-sided neck pain as well as left shoulder, arm and left hip pain.  Patient was able to stand and ambulate after incident, however has pain.  Rates his pain a 7/10.  Pain does not radiate.  Head pain is located just superior to left eyebrow over the forehead region.  Denies vision changes, eye pain, facial pain, chest pain, shortness of breath, abdominal pain, decreased range of motion, numbness or tingling in his extremities.  Patient's family is present with patient and she states patient is at his baseline mentation.  History provided by patient and family.  No interpreter was used.  HPI  Past Medical History:  Diagnosis Date  . Back pain   . Renal disorder    kidney stones  . Spinal stenosis, lumbar region, with neurogenic claudication 07/07/2014    Patient Active Problem List   Diagnosis Date Noted  . Urinary frequency 02/05/2018  . PAD (peripheral artery disease) (HCC)-Mild 12/26/2017  . Chronic pain disorder 11/17/2017  . Unstable gait 11/17/2017  . BPH associated with nocturia 09/26/2017  . Allergic rhinitis 06/09/2017  . Tobacco use disorder 02/14/2017  . Generalized osteoarthritis of multiple sites 02/14/2017  . Bilateral lower extremity edema 08/12/2016  . Chronic right-sided low back pain with right-sided sciatica 03/14/2016  . Chronic pain of right knee 03/14/2016  . DDD (degenerative  disc disease), lumbar 07/07/2014  . DDD (degenerative disc disease), cervical 07/07/2014  . DJD (degenerative joint disease) of knee 07/07/2014  . Bilateral occipital neuralgia 07/07/2014  . Spinal stenosis, lumbar region, with neurogenic claudication 07/07/2014    Past Surgical History:  Procedure Laterality Date  . CATARACT EXTRACTION W/PHACO Left 08/22/2016   Procedure: CATARACT EXTRACTION PHACO AND INTRAOCULAR LENS PLACEMENT ([place]) Left;  Surgeon: [name], MD;  Location: [place];  Service: Ophthalmology;  Laterality: Left;  . CATARACT EXTRACTION W/PHACO Right 10/25/2016   Procedure: CATARACT EXTRACTION PHACO AND INTRAOCULAR LENS PLACEMENT ([place]) WITH ISTENT RIGHT  ;  Surgeon: [name], MD;  Location: [place];  Service: Ophthalmology;  Laterality: Right;  TOPICAL RIGHT  ISTENT   trabecular bypass stent was implanted/explanted.  . INGUINAL HERNIA REPAIR    . KNEE SURGERY Right   . NECK SURGERY          Home Medications    Prior to Admission medications   Medication Sig Start Date End Date Taking? Authorizing Provider  atorvastatin (LIPITOR) 10 MG tablet Take 1 tablet (10 mg total) by mouth daily. 02/05/18   [name], [name], MD  DULoxetine (CYMBALTA) 60 MG capsule Take 1 capsule (60 mg total) by mouth daily. 02/05/18   [name], [name], MD  lidocaine (XYLOCAINE) 5 % ointment Apply 1 application topically 2 (two) times daily as needed. 02/06/18   [name], [name], MD  nicotine (NICODERM CQ - DOSED IN MG/24 HOURS) 14 mg/24hr patch Place 1 patch (14  mg total) onto the skin daily. Patient not taking: Reported on 02/05/2018 09/26/17   [name], [name], MD  Oxycodone HCl 10 MG TABS Take 10 mg by mouth 2 (two) times daily.     [provider]  solifenacin (VESICARE) 5 MG tablet Take 1 tablet (5 mg total) by mouth daily. 02/05/18   [name], [name], MD  tamsulosin (FLOMAX) 0.4 MG CAPS capsule Take 1 capsule (0.4 mg total) by mouth daily. 12/26/17    [name], [name], MD    Family History Family History  Problem Relation Age of Onset  . Diabetes Mother   . Diabetes Brother   . Diabetes Sister     Social History Social History   Tobacco Use  . Smoking status: Current Every Day Smoker    Packs/day: 0.75    Types: Cigarettes  . Smokeless tobacco: Never Used  Substance Use Topics  . Alcohol use: No  . Drug use: Never     Allergies   Patient has no known allergies.   Review of Systems Review of Systems  Constitutional: Negative.   HENT: Negative.   Respiratory: Negative.   Genitourinary: Negative.   Musculoskeletal:       Left shoulder, left humerus as well as left hip pain.  Skin: Negative.   Neurological: Positive for headaches.  All other systems reviewed and are negative.    Physical Exam Updated Vital Signs BP (!) 197/114   Pulse 72   Temp 98.8 F (37.1 C) (Oral)   Resp 16   Ht 5\' 8"  (1.727 m)   Wt 68.9 kg   SpO2 96%   BMI 23.11 kg/m   Physical Exam Vitals signs and nursing note reviewed.  Constitutional:      General: He is not in acute distress.    Appearance: He is well-developed. He is not diaphoretic.  Eyes:     General: Lids are normal. Vision grossly intact. Gaze aligned appropriately. No allergic shiner, visual field deficit or scleral icterus.    Extraocular Movements: Extraocular movements intact.     Conjunctiva/sclera: Conjunctivae normal.     Right eye: Right conjunctiva is not injected. No chemosis, exudate or hemorrhage.    Left eye: Left conjunctiva is not injected. No chemosis, exudate or hemorrhage.    Pupils: Pupils are equal, round, and reactive to light.  Neck:     Musculoskeletal: Full passive range of motion without pain, normal range of motion and neck supple.     Trachea: Trachea and phonation normal.     Comments: Tenderness to palpation over left paraspinal muscles.  No midline neck tenderness. Cardiovascular:     Rate and Rhythm: Normal rate and regular rhythm.   Pulmonary:     Effort: Pulmonary effort is normal. No respiratory distress.  Abdominal:     General: There is no distension.     Palpations: Abdomen is soft.  Musculoskeletal: Normal range of motion.     Comments: Full range of motion of the T-spine and L-spine with flexion, hyperextension, and lateral flexion. No midline tenderness or stepoffs. No tenderness to palpation of the spinous processes of the T-spine or L-spine. No tenderness to palpation of the paraspinous muscles of the L-spine. Negative straight leg raise.  Skin:    General: Skin is warm and dry.     Comments: No lacerations, abrasions or contusions.  Neurological:     Mental Status: He is alert.     Comments: Speech is clear and goal oriented, follows commands Normal  5/5 strength in upper and lower extremities bilaterally including dorsiflexion and plantar flexion, strong and equal grip strength Sensation normal to light and sharp touch Moves extremities without ataxia, coordination intact Normal gait Normal balance No Clonus      ED Treatments / Results  Labs (all labs ordered are listed, but only abnormal results are displayed) Labs Reviewed - No data to display  EKG None  Radiology Ct Head Wo Contrast  Result Date: 02/19/2018 CLINICAL DATA:  Headache after trauma EXAM: CT HEAD WITHOUT CONTRAST CT CERVICAL SPINE WITHOUT CONTRAST TECHNIQUE: Multidetector CT imaging of the head and cervical spine was performed following the standard protocol without intravenous contrast. Multiplanar CT image reconstructions of the cervical spine were also generated. COMPARISON:  12/22/2017 head CT FINDINGS: CT HEAD FINDINGS Brain: No evidence of acute infarction, hemorrhage, hydrocephalus, extra-axial collection or mass lesion/mass effect. Moderate chronic small vessel ischemic disease with cortical atrophy is redemonstrated. Vascular: No hyperdense vessel or unexpected calcification. Right vertebral arterial dominance. Skull:  Negative for fracture. Sinuses/Orbits: Remote medial blowout fracture of the left orbit. Other: Left frontotemporal and supraorbital scalp contusion with soft tissue swelling. CT CERVICAL SPINE FINDINGS Alignment: Straightening of cervical lordosis. Stable minimal anterolisthesis of C7 on T1. Skull base and vertebrae: Intact skull base. ACDF of the cervical spine with well-incorporated interbody block at C4-5. No acute cervical spine fracture. Soft tissues and spinal canal: No prevertebral soft tissue swelling. Mild-to-moderate osseous canal stenosis C3 through C5 Disc levels: Moderate disc flattening C2-3, C3-4 at C5-6 with marked disc flattening at C6-7. Posterior marginal osteophytes are noted at C6-7. Multilevel degenerative facet arthropathy and uncinate process spurring contribute to bilateral foraminal and central canal stenosis. Upper chest: Centrilobular and paraseptal emphysema. No dominant mass. Other: None IMPRESSION: Head CT: 1. Left frontotemporal and supraorbital scalp contusion with soft tissue swelling. 2. No acute intracranial abnormality. 3. Chronic moderate small vessel ischemic disease and cortical atrophy. Cervical spine CT: 1. No acute cervical spine fracture. 2. ACDF of the cervical spine with well-incorporated interbody block at C4-5. 3. Multilevel degenerative disc disease and facet arthropathy as above consistent with diffuse cervical spondylosis. Electronically Signed   By: Ashley Royalty M.D.   On: 02/19/2018 18:20   Ct Cervical Spine Wo Contrast  Result Date: 02/19/2018 CLINICAL DATA:  Headache after trauma EXAM: CT HEAD WITHOUT CONTRAST CT CERVICAL SPINE WITHOUT CONTRAST TECHNIQUE: Multidetector CT imaging of the head and cervical spine was performed following the standard protocol without intravenous contrast. Multiplanar CT image reconstructions of the cervical spine were also generated. COMPARISON:  12/22/2017 head CT FINDINGS: CT HEAD FINDINGS Brain: No evidence of acute  infarction, hemorrhage, hydrocephalus, extra-axial collection or mass lesion/mass effect. Moderate chronic small vessel ischemic disease with cortical atrophy is redemonstrated. Vascular: No hyperdense vessel or unexpected calcification. Right vertebral arterial dominance. Skull: Negative for fracture. Sinuses/Orbits: Remote medial blowout fracture of the left orbit. Other: Left frontotemporal and supraorbital scalp contusion with soft tissue swelling. CT CERVICAL SPINE FINDINGS Alignment: Straightening of cervical lordosis. Stable minimal anterolisthesis of C7 on T1. Skull base and vertebrae: Intact skull base. ACDF of the cervical spine with well-incorporated interbody block at C4-5. No acute cervical spine fracture. Soft tissues and spinal canal: No prevertebral soft tissue swelling. Mild-to-moderate osseous canal stenosis C3 through C5 Disc levels: Moderate disc flattening C2-3, C3-4 at C5-6 with marked disc flattening at C6-7. Posterior marginal osteophytes are noted at C6-7. Multilevel degenerative facet arthropathy and uncinate process spurring contribute to bilateral foraminal and central canal stenosis.  Upper chest: Centrilobular and paraseptal emphysema. No dominant mass. Other: None IMPRESSION: Head CT: 1. Left frontotemporal and supraorbital scalp contusion with soft tissue swelling. 2. No acute intracranial abnormality. 3. Chronic moderate small vessel ischemic disease and cortical atrophy. Cervical spine CT: 1. No acute cervical spine fracture. 2. ACDF of the cervical spine with well-incorporated interbody block at C4-5. 3. Multilevel degenerative disc disease and facet arthropathy as above consistent with diffuse cervical spondylosis. Electronically Signed   By: Ashley Royalty M.D.   On: 02/19/2018 18:20   Dg Shoulder Left  Result Date: 02/19/2018 CLINICAL DATA:  Recent fall with left shoulder pain, initial encounter EXAM: LEFT SHOULDER - 2+ VIEW COMPARISON:  09/22/2017 FINDINGS: Degenerative changes  of the glenohumeral joint are seen. No acute fracture or dislocation is noted. Degenerative change of the acromioclavicular joint is seen as well. Underlying bony thorax is within normal limits. IMPRESSION: Degenerative change without acute abnormality. Electronically Signed   By: Inez Catalina M.D.   On: 02/19/2018 17:28   Dg Humerus Left  Result Date: 02/19/2018 CLINICAL DATA:  Recent fall with left arm pain, initial encounter EXAM: LEFT HUMERUS - 2+ VIEW COMPARISON:  None. FINDINGS: Degenerative changes of the acromioclavicular joint and glenohumeral joint are seen. Mild degenerative changes are noted about the elbow joint. No soft tissue abnormality is seen. No acute fracture is noted. Old healed left rib fractures are noted. IMPRESSION: No acute bony abnormality noted. Electronically Signed   By: Inez Catalina M.D.   On: 02/19/2018 17:30   Dg Hip Unilat W Or Wo Pelvis 2-3 Views Left  Result Date: 02/19/2018 CLINICAL DATA:  Recent fall with hip pain, initial encounter EXAM: DG HIP (WITH OR WITHOUT PELVIS) 3V LEFT COMPARISON:  09/22/2017 FINDINGS: Pelvic ring is intact. No acute fracture or dislocation is noted. Degenerative changes of the hip joints are seen bilaterally. Degenerative change of the lumbar spine is noted as well. No acute soft tissue abnormality is seen. IMPRESSION: Degenerative change without acute abnormality. Electronically Signed   By: Inez Catalina M.D.   On: 02/19/2018 17:29    Procedures Procedures (including critical care time)  Medications Ordered in ED Medications  morphine 4 MG/ML injection 4 mg (4 mg Intramuscular Given 02/19/18 1715)     Initial Impression / Assessment and Plan / ED Course  I have reviewed the triage vital signs and the nursing notes.  Pertinent labs & imaging results that were available during my care of the patient were reviewed by me and considered in my medical decision making (see chart for details).  78 year old gentleman who appears  otherwise well presents for evaluation after mechanical fall.  Afebrile, nonseptic, non-ill-appearing.  Patient with pain to frontal portion of head, left-sided neck pain as well as left shoulder and left hip pain.  No eye pain or difficulty with extraocular movements.  No vision changes. Patient ambulatory after accident.  Nonfocal neurologic exam without neurologic deficits.  Patient with elevated systolic blood pressure in department at 195 on arrival.  Patient refusing blood pressure medications at this time.  Patient states he does have a history of hypertension, however is not on any medication for this. Will obtain imaging and reevaluate.  CT head with soft tissue swelling located to left forehead.  CT cervical spine negative.  Plain film shoulder, humerus and left hip negative.  Patient ambulatory in department without difficulty.  I have thoroughly discussed patient's blood pressure with him.  He does have elevated systolic and diastolic in department.  He is asymptomatic without chest pain, shortness of breath, vision changes, nausea or vomiting or abdominal pain.  I have offered again blood pressure medication to patient at this time.  Patient is adamant that he does not want any blood pressure medication at this time.  Thorough discussion recheck with PCP for reevaluation of his blood pressure.  Patient is hemodynamically stable and appropriate for DC home at this time.  I discussed return precautions with patient and family in room.  Family and patient voiced understanding and are agreeable for follow-up.    Final Clinical Impressions(s) / ED Diagnoses   Final diagnoses:  Fall, initial encounter  Hematoma of scalp, initial encounter  Acute pain of left shoulder  Left hip pain  Elevated blood pressure reading    ED Discharge Orders    None       Ulonda Klosowski A, PA-C 02/19/18 Lattie Corns    Charlesetta Shanks, MD 02/19/18 2150

## 2018-02-19 NOTE — Discharge Instructions (Addendum)
Evaluated today for fall.  Your CT scan and your x-rays were negative.  May take Tylenol as needed for pain.  Follow-up with PCP for reevaluation of your blood pressure.  Elevated blood pressure may cause damage to your eyes, brain, heart and kidneys.

## 2018-02-19 NOTE — ED Triage Notes (Signed)
Pt presents to ED via [facility]. Pt was at doctors office when he fell and hit his head. Pt was headed to doctor to get a brace for his right knee and his knee gave out. Pt only complains of pain his head where there is a noticeable hematoma.  BP 162/98 RR 18 HR 84  Pt is not on blood thinners

## 2018-02-20 ENCOUNTER — Telehealth: Payer: Self-pay | Admitting: *Deleted

## 2018-02-20 NOTE — Telephone Encounter (Signed)
Patient arrived at [facility] for an appointment with Dr. Durward Fortes. Patient fell in parking lot before entering the building. Patient's family member advised front desk of the fall. Staff was able to walk patient into the lobby where Dr. Durward Fortes evaluated a hematoma on the patient's forehead. Patient stated he fell because his knee gave way. Patient stated his vision was blurry. Dr. Durward Fortes advised the patient to seek medical care. EMS was called. Patient was given a knee brace for stability.

## 2018-02-21 ENCOUNTER — Telehealth: Payer: Self-pay | Admitting: Family Medicine

## 2018-02-21 NOTE — Telephone Encounter (Unsigned)
Copied from [place] [id number]. Topic: Quick Communication - Home Health Verbal Orders >> Feb 21, 2018  4:53 PM Percell Belt A wrote: Caller/Agency: [name] with [facility]  Callback Number: [phone] Requesting OT/PT/Skilled Nursing/Social Work: she wanted to report a missed visit , he was refusing visit.  Pt was in hosp for a fall

## 2018-02-22 DIAGNOSIS — M48062 Spinal stenosis, lumbar region with neurogenic claudication: Secondary | ICD-10-CM | POA: Diagnosis not present

## 2018-02-22 DIAGNOSIS — M150 Primary generalized (osteo)arthritis: Secondary | ICD-10-CM | POA: Diagnosis not present

## 2018-02-22 DIAGNOSIS — Z720 Tobacco use: Secondary | ICD-10-CM | POA: Diagnosis not present

## 2018-02-22 DIAGNOSIS — G894 Chronic pain syndrome: Secondary | ICD-10-CM | POA: Diagnosis not present

## 2018-02-22 DIAGNOSIS — M6281 Muscle weakness (generalized): Secondary | ICD-10-CM | POA: Diagnosis not present

## 2018-02-22 DIAGNOSIS — Z9181 History of falling: Secondary | ICD-10-CM | POA: Diagnosis not present

## 2018-02-22 DIAGNOSIS — M5431 Sciatica, right side: Secondary | ICD-10-CM | POA: Diagnosis not present

## 2018-02-22 DIAGNOSIS — I10 Essential (primary) hypertension: Secondary | ICD-10-CM | POA: Diagnosis not present

## 2018-02-22 NOTE — Telephone Encounter (Signed)
Spoke with [name] and gave verbal orders as requested for PT.

## 2018-02-22 NOTE — Telephone Encounter (Signed)
Caller/Agency: [name] with [facility]  Callback Number: [phone] Requesting PT Frequency: 2x4

## 2018-02-22 NOTE — Telephone Encounter (Signed)
Noted.  Patient had a missed visit due to hospital visit for fall. FYI sent to Dr. [name] for review.

## 2018-02-23 DIAGNOSIS — M150 Primary generalized (osteo)arthritis: Secondary | ICD-10-CM | POA: Diagnosis not present

## 2018-02-23 DIAGNOSIS — M48062 Spinal stenosis, lumbar region with neurogenic claudication: Secondary | ICD-10-CM | POA: Diagnosis not present

## 2018-02-23 DIAGNOSIS — Z720 Tobacco use: Secondary | ICD-10-CM | POA: Diagnosis not present

## 2018-02-23 DIAGNOSIS — M6281 Muscle weakness (generalized): Secondary | ICD-10-CM | POA: Diagnosis not present

## 2018-02-23 DIAGNOSIS — G894 Chronic pain syndrome: Secondary | ICD-10-CM | POA: Diagnosis not present

## 2018-02-23 DIAGNOSIS — Z9181 History of falling: Secondary | ICD-10-CM | POA: Diagnosis not present

## 2018-02-23 DIAGNOSIS — I10 Essential (primary) hypertension: Secondary | ICD-10-CM | POA: Diagnosis not present

## 2018-02-23 DIAGNOSIS — M5431 Sciatica, right side: Secondary | ICD-10-CM | POA: Diagnosis not present

## 2018-02-26 ENCOUNTER — Ambulatory Visit: Payer: Medicare Other | Admitting: Family Medicine

## 2018-02-27 ENCOUNTER — Ambulatory Visit (INDEPENDENT_AMBULATORY_CARE_PROVIDER_SITE_OTHER): Payer: Medicare Other | Admitting: Family Medicine

## 2018-02-27 ENCOUNTER — Encounter: Payer: Self-pay | Admitting: Family Medicine

## 2018-02-27 VITALS — BP 132/84 | HR 63 | Temp 98.4°F | Resp 16 | Ht 68.0 in | Wt 147.0 lb

## 2018-02-27 DIAGNOSIS — N401 Enlarged prostate with lower urinary tract symptoms: Secondary | ICD-10-CM | POA: Diagnosis not present

## 2018-02-27 DIAGNOSIS — F17200 Nicotine dependence, unspecified, uncomplicated: Secondary | ICD-10-CM

## 2018-02-27 DIAGNOSIS — M159 Polyosteoarthritis, unspecified: Secondary | ICD-10-CM

## 2018-02-27 DIAGNOSIS — R2681 Unsteadiness on feet: Secondary | ICD-10-CM | POA: Diagnosis not present

## 2018-02-27 DIAGNOSIS — R351 Nocturia: Secondary | ICD-10-CM

## 2018-02-27 DIAGNOSIS — R350 Frequency of micturition: Secondary | ICD-10-CM

## 2018-02-27 MED ORDER — NICOTINE 21 MG/24HR TD PT24: 21.0000 mg | MEDICATED_PATCH | Freq: Every day | TRANSDERMAL | 0 refills | Status: DC

## 2018-02-27 MED ORDER — SOLIFENACIN SUCCINATE 5 MG PO TABS: 5.0000 mg | ORAL_TABLET | Freq: Every day | ORAL | 2 refills | Status: DC

## 2018-02-27 NOTE — Assessment & Plan Note (Signed)
Nocturia improved with Vesicare 5 mg. No changes in Vesicare or Flomax.

## 2018-02-27 NOTE — Patient Instructions (Addendum)
A few things to remember from today's visit:   Tobacco use disorder - Plan: nicotine (NICODERM CQ - DOSED IN MG/24 HOURS) 21 mg/24hr patch  Generalized osteoarthritis of multiple sites  BPH associated with nocturia  Unstable gait  Urinary frequency - Plan: solifenacin (VESICARE) 5 MG tablet  No changes in medications today except for the nicotine patches. Fall precautions. Continue PT at home.   Please be sure medication list is accurate. If a new problem present, please set up appointment sooner than planned today.

## 2018-02-27 NOTE — Assessment & Plan Note (Signed)
He improved greatly with Vesicare 5 mg daily. We discussed some side effects. Follow-up in 4 months.

## 2018-02-27 NOTE — Assessment & Plan Note (Signed)
Cymbalta 60 mg helping with arthritis. We discussed some side effects. No changes in current management. Follow-up in 4 months.

## 2018-02-27 NOTE — Assessment & Plan Note (Signed)
He is interested in trying nicotine patches for smoking cessation.  Nicotine 21 mg patches sent to her pharmacy, recommend asking if he is health insurance covers prescription. We discussed benefits of smoking cessation as well as adverse effects of tobacco use. We discussed some side effects of nicotine.

## 2018-02-27 NOTE — Progress Notes (Signed)
HPI:   [name] [name] is a 78 y.o. male, who is here today with his friend (neighbor to follow on recent ER visit.  Recently evaluated in the ER, 02/19/2018, due to fall. He felt while he was at his orthopedics office, lost balance and fell. Presented to the ER complaining about left-sided hip pain, left shoulder pain, and left-sided neck pain.  He feels better in regard to generalized arthralgias.  He still has a "knot" left temporal area, slowly improving. Headache has resolved. He denies visual changes, focal deficit, nausea, or vomiting.  Generalized OA, complaining of worsening left shoulder pain after fall. Pain is exacerbated by movements and by cold weather and movement.  He is receiving PT at home. Last visit Cymbalta was increased from 30 mg to 60 mg. Cymbalta is still helping with joint pain and lower extremity pain.   Last visit Vesicare was added to help with urinary frequency, nocturia. He has tolerated medication well, now he is getting up to the bathroom once at night.He was doing so q 2 hours.  He is also on Flomax 0.4 mg daily. He denies dysuria, gross hematuria, or decreased urine output. He has not noted side effects.  Tobacco use: He did not receive nicotine patches 14 mg, states that Rx was not there. He is not sure if his insurance covers prescription. Smoking about 15 cig per day.   Review of Systems  Constitutional: Negative for activity change, appetite change, fatigue and fever.  Eyes: Negative for visual disturbance.  Respiratory: Negative for cough, shortness of breath and wheezing.   Cardiovascular: Negative for chest pain, palpitations and leg swelling.  Gastrointestinal: Negative for abdominal pain, nausea and vomiting.  Genitourinary: Negative for decreased urine volume, dysuria and hematuria.  Musculoskeletal: Positive for arthralgias, back pain, gait problem and myalgias.  Skin: Negative for rash.  Neurological: Negative  for syncope and headaches.  Psychiatric/Behavioral: Negative for confusion. The patient is nervous/anxious.       Current Outpatient Medications on File Prior to Visit  Medication Sig Dispense Refill  . atorvastatin (LIPITOR) 10 MG tablet Take 1 tablet (10 mg total) by mouth daily. 90 tablet 3  . DULoxetine (CYMBALTA) 60 MG capsule Take 1 capsule (60 mg total) by mouth daily. 90 capsule 1  . lidocaine (XYLOCAINE) 5 % ointment Apply 1 application topically 2 (two) times daily as needed. 50 g 2  . Oxycodone HCl 10 MG TABS Take 10 mg by mouth 2 (two) times daily.     . tamsulosin (FLOMAX) 0.4 MG CAPS capsule Take 1 capsule (0.4 mg total) by mouth daily. 90 capsule 2   No current facility-administered medications on file prior to visit.      Past Medical History:  Diagnosis Date  . Back pain   . Renal disorder    kidney stones  . Spinal stenosis, lumbar region, with neurogenic claudication 07/07/2014   No Known Allergies  Social History   Socioeconomic History  . Marital status: Widowed    Spouse name: Not on file  . Number of children: Not on file  . Years of education: Not on file  . Highest education level: Not on file  Occupational History  . Not on file  Social Needs  . Financial resource strain: Not on file  . Food insecurity:    Worry: Not on file    Inability: Not on file  . Transportation needs:    Medical: Not on file    Non-medical:  Not on file  Tobacco Use  . Smoking status: Current Every Day Smoker    Packs/day: 0.75    Types: Cigarettes  . Smokeless tobacco: Never Used  Substance and Sexual Activity  . Alcohol use: No  . Drug use: Never  . Sexual activity: Not on file  Lifestyle  . Physical activity:    Days per week: Not on file    Minutes per session: Not on file  . Stress: Not on file  Relationships  . Social connections:    Talks on phone: Not on file    Gets together: Not on file    Attends religious service: Not on file    Active member of  club or organization: Not on file    Attends meetings of clubs or organizations: Not on file    Relationship status: Not on file  Other Topics Concern  . Not on file  Social History Narrative  . Not on file    Vitals:   02/27/18 1403  BP: 132/84  Pulse: 63  Resp: 16  Temp: 98.4 F (36.9 C)  SpO2: 94%   Body mass index is 22.35 kg/m.   Physical Exam  Nursing note reviewed. Constitutional: He is oriented to person, place, and time. He appears well-developed. No distress.  HENT:  Head: Normocephalic and atraumatic.  Mouth/Throat: Oropharynx is clear and moist and mucous membranes are normal.  Eyes: Pupils are equal, round, and reactive to light. Conjunctivae are normal.  Cardiovascular: Normal rate and regular rhythm.  No murmur heard. PT pulses present bilateral.  Respiratory: Effort normal and breath sounds normal. No respiratory distress.  GI: Soft. He exhibits no mass. There is no abdominal tenderness.  Musculoskeletal:        General: No edema.     Left shoulder: He exhibits decreased range of motion. Tenderness: with movement.     Left elbow: He exhibits decreased range of motion. He exhibits no swelling. No tenderness found.  Lymphadenopathy:    He has no cervical adenopathy.  Neurological: He is alert and oriented to person, place, and time. He has normal strength. No cranial nerve deficit. Gait abnormal.  Unstable gait, he has a cane.  Skin: Skin is warm. No rash noted. No erythema.  Psychiatric: He has a normal mood and affect. Cognition and memory are normal.  Well groomed, good eye contact.    ASSESSMENT AND PLAN:  [name] [name] was seen today for follow-up.  Diagnoses and all orders for this visit:  Tobacco use disorder He is interested in trying nicotine patches for smoking cessation.  Nicotine 21 mg patches sent to her pharmacy, recommend asking if he is health insurance covers prescription. We discussed benefits of smoking cessation as well as adverse  effects of tobacco use. We discussed some side effects of nicotine.  Generalized osteoarthritis of multiple sites Cymbalta 60 mg helping with arthritis. We discussed some side effects. No changes in current management. Follow-up in 4 months.  BPH associated with nocturia Nocturia improved with Vesicare 5 mg. No changes in Vesicare or Flomax.   Unstable gait High risk for falls. Recommend using a walker but he states that he does not have a place in his car reported. We discussed complications of frequent falls. Some recommendations given about fall prevention.  Urinary frequency He improved greatly with Vesicare 5 mg daily. We discussed some side effects. Follow-up in 4 months.    Return in about 4 months (around 06/28/2018).     [name] [name] G. [name],  MD  [facility]. [place] office.

## 2018-02-27 NOTE — Assessment & Plan Note (Signed)
High risk for falls. Recommend using a walker but he states that he does not have a place in his car reported. We discussed complications of frequent falls. Some recommendations given about fall prevention.

## 2018-02-28 DIAGNOSIS — Z720 Tobacco use: Secondary | ICD-10-CM | POA: Diagnosis not present

## 2018-02-28 DIAGNOSIS — M6281 Muscle weakness (generalized): Secondary | ICD-10-CM | POA: Diagnosis not present

## 2018-02-28 DIAGNOSIS — M150 Primary generalized (osteo)arthritis: Secondary | ICD-10-CM | POA: Diagnosis not present

## 2018-02-28 DIAGNOSIS — I10 Essential (primary) hypertension: Secondary | ICD-10-CM | POA: Diagnosis not present

## 2018-02-28 DIAGNOSIS — G894 Chronic pain syndrome: Secondary | ICD-10-CM | POA: Diagnosis not present

## 2018-02-28 DIAGNOSIS — Z9181 History of falling: Secondary | ICD-10-CM | POA: Diagnosis not present

## 2018-02-28 DIAGNOSIS — M5431 Sciatica, right side: Secondary | ICD-10-CM | POA: Diagnosis not present

## 2018-02-28 DIAGNOSIS — M48062 Spinal stenosis, lumbar region with neurogenic claudication: Secondary | ICD-10-CM | POA: Diagnosis not present

## 2018-03-02 DIAGNOSIS — Z9181 History of falling: Secondary | ICD-10-CM | POA: Diagnosis not present

## 2018-03-02 DIAGNOSIS — I10 Essential (primary) hypertension: Secondary | ICD-10-CM | POA: Diagnosis not present

## 2018-03-02 DIAGNOSIS — G894 Chronic pain syndrome: Secondary | ICD-10-CM | POA: Diagnosis not present

## 2018-03-02 DIAGNOSIS — M48062 Spinal stenosis, lumbar region with neurogenic claudication: Secondary | ICD-10-CM | POA: Diagnosis not present

## 2018-03-02 DIAGNOSIS — M150 Primary generalized (osteo)arthritis: Secondary | ICD-10-CM | POA: Diagnosis not present

## 2018-03-02 DIAGNOSIS — Z720 Tobacco use: Secondary | ICD-10-CM | POA: Diagnosis not present

## 2018-03-02 DIAGNOSIS — M6281 Muscle weakness (generalized): Secondary | ICD-10-CM | POA: Diagnosis not present

## 2018-03-02 DIAGNOSIS — M5431 Sciatica, right side: Secondary | ICD-10-CM | POA: Diagnosis not present

## 2018-03-07 DIAGNOSIS — M6281 Muscle weakness (generalized): Secondary | ICD-10-CM | POA: Diagnosis not present

## 2018-03-07 DIAGNOSIS — M48062 Spinal stenosis, lumbar region with neurogenic claudication: Secondary | ICD-10-CM | POA: Diagnosis not present

## 2018-03-07 DIAGNOSIS — I10 Essential (primary) hypertension: Secondary | ICD-10-CM | POA: Diagnosis not present

## 2018-03-07 DIAGNOSIS — Z720 Tobacco use: Secondary | ICD-10-CM | POA: Diagnosis not present

## 2018-03-07 DIAGNOSIS — Z9181 History of falling: Secondary | ICD-10-CM | POA: Diagnosis not present

## 2018-03-07 DIAGNOSIS — G894 Chronic pain syndrome: Secondary | ICD-10-CM | POA: Diagnosis not present

## 2018-03-07 DIAGNOSIS — M150 Primary generalized (osteo)arthritis: Secondary | ICD-10-CM | POA: Diagnosis not present

## 2018-03-07 DIAGNOSIS — M5431 Sciatica, right side: Secondary | ICD-10-CM | POA: Diagnosis not present

## 2018-03-09 ENCOUNTER — Telehealth: Payer: Self-pay | Admitting: Family Medicine

## 2018-03-09 NOTE — Telephone Encounter (Signed)
Copied from [place] [id number]. Topic: Quick Communication - Home Health Verbal Orders >> Mar 09, 2018  2:36 PM Nils Flack wrote: Caller/Agency: [place] Number: [phone] Requesting OT/PT/Skilled Nursing/Social Work: pt - she is calling to report missed visit, she did speak to pt and will start back up on Monday  Frequency:

## 2018-03-09 NOTE — Telephone Encounter (Signed)
Noted. FYI sent to Dr. Jordan for review. 

## 2018-03-16 ENCOUNTER — Ambulatory Visit: Payer: Self-pay | Admitting: *Deleted

## 2018-03-16 DIAGNOSIS — Z9181 History of falling: Secondary | ICD-10-CM | POA: Diagnosis not present

## 2018-03-16 DIAGNOSIS — I10 Essential (primary) hypertension: Secondary | ICD-10-CM | POA: Diagnosis not present

## 2018-03-16 DIAGNOSIS — Z720 Tobacco use: Secondary | ICD-10-CM | POA: Diagnosis not present

## 2018-03-16 DIAGNOSIS — M6281 Muscle weakness (generalized): Secondary | ICD-10-CM | POA: Diagnosis not present

## 2018-03-16 DIAGNOSIS — M5431 Sciatica, right side: Secondary | ICD-10-CM | POA: Diagnosis not present

## 2018-03-16 DIAGNOSIS — M150 Primary generalized (osteo)arthritis: Secondary | ICD-10-CM | POA: Diagnosis not present

## 2018-03-16 DIAGNOSIS — G894 Chronic pain syndrome: Secondary | ICD-10-CM | POA: Diagnosis not present

## 2018-03-16 DIAGNOSIS — M48062 Spinal stenosis, lumbar region with neurogenic claudication: Secondary | ICD-10-CM | POA: Diagnosis not present

## 2018-03-16 NOTE — Telephone Encounter (Signed)
I am not sure what I can do at this time. Patient has been advised to go to the ER, I agree with recommendation but patient has refused.  Please advise patient to go to the ER/call 911 if chest pain, shortness of breath, palpitations, or MS changes. He can continue monitoring BP and HR. I can see him next week.  Thanks, BJ

## 2018-03-16 NOTE — Telephone Encounter (Signed)
Please advise 

## 2018-03-16 NOTE — Telephone Encounter (Signed)
I called the flow coordinator at Dr. Doug Sou office at [place] and made her aware of the situation.   She is going to let Dr. [name] know.

## 2018-03-16 NOTE — Telephone Encounter (Signed)
Left detailed message with recommendations per Dr. Jordan. 

## 2018-03-16 NOTE — Telephone Encounter (Signed)
[name] [name], physical therapist with [facility] called.    The pt was in the background answering the questions she was asking him.  His pulse normally runs in the 80s.   Over the last 3-4 weeks it has been gradually dropping.  It is now in the 50s even after walking.   At the time of the call his pulse was 55, BP 150/90 (which is elevated for him).   Denies any dizziness, sweating. He then mentioned he had chest pain last night that lasted about 2-3 hours.   He did not have any other symptoms with it.   He took an aspirin and the chest pain went away.  He denies any symptoms with ambulation.  The PT noticed he had a medication change by Dr. [name] back in January.   Does not know if this is contributing to the pulse change or not.   That is  The only thing that is changed.    No cardiac history.   He does smoke.  I let him know that he needs to go to the ED per the protocol.   [name] [name] let him know he needed to go to the ED however he is not going to go.    He will be alone once Carolyn leaves.   His son may come by per pt but not sure.  I emphasized the importance of him going to the ED due to the low pulse  And chest pain he had last night.  He still is not going.  I sent this to Dr. Doug Sou office to make them aware.   Reason for Disposition . Heart beating very slowly (e.g., < 50 / minute)  (Exception: athlete) . Heart beating < 50 beats per minute OR > 140 beats per minute  Answer Assessment - Initial Assessment Questions 1. DESCRIPTION: "Please describe your heart rate or heart beat that you are having" (e.g., fast/slow, regular/irregular, skipped or extra beats, "palpitations")     Physical Therapist called in with pulse 50's   Usually is 80's.   55-59 after walking manual pulse check. 2. ONSET: "When did it start?" (Minutes, hours or days)      3-4 weeks ago with medication change in January. 3. DURATION: "How long does it last" (e.g., seconds, minutes, hours)     It has been  gradually going down since January. 4. PATTERN "Does it come and go, or has it been constant since it started?"  "Does it get worse with exertion?"   "Are you feeling it now?"     Pulse 61 before walking.   After walking it was 51.   5. TAP: "Using your hand, can you tap out what you are feeling on a chair or table in front of you, so that I can hear?" (Note: not all patients can do this)       55-56 now 6. HEART RATE: "Can you tell me your heart rate?" "How many beats in 15 seconds?"  (Note: not all patients can do this)       See above 7. RECURRENT SYMPTOM: "Have you ever had this before?" If so, ask: "When was the last time?" and "What happened that time?"      No 8. CAUSE: "What do you think is causing the palpitations?"     N/A 9. CARDIAC HISTORY: "Do you have any history of heart disease?" (e.g., heart attack, angina, bypass surgery, angioplasty, arrhythmia)      No 10. OTHER SYMPTOMS: "Do you  have any other symptoms?" (e.g., dizziness, chest pain, sweating, difficulty breathing)       Chest pain last night  He took aspirin and chest pain went away and pt went back to sleep. 11. PREGNANCY: "Is there any chance you are pregnant?" "When was your last menstrual period?"       N/A  Answer Assessment - Initial Assessment Questions 1. LOCATION: "Where does it hurt?"       Pain on left side 2. RADIATION: "Does the pain go anywhere else?" (e.g., into neck, jaw, arms, back)     No 3. ONSET: "When did the chest pain begin?" (Minutes, hours or days)      Last pain last night only one time.   Does not know the time it happened. 4. PATTERN "Does the pain come and go, or has it been constant since it started?"  "Does it get worse with exertion?"      He was watching TV when the chest pain occurred. 5. DURATION: "How long does it last" (e.g., seconds, minutes, hours)     Last 2-3 hours. 6. SEVERITY: "How bad is the pain?"  (e.g., Scale 1-10; mild, moderate, or severe)    - MILD (1-3): doesn't  interfere with normal activities     - MODERATE (4-7): interferes with normal activities or awakens from sleep    - SEVERE (8-10): excruciating pain, unable to do any normal activities       Sharp pain 7. CARDIAC RISK FACTORS: "Do you have any history of heart problems or risk factors for heart disease?" (e.g., prior heart attack, angina; high blood pressure, diabetes, being overweight, high cholesterol, smoking, or strong family history of heart disease)     No cardiac history. 8. PULMONARY RISK FACTORS: "Do you have any history of lung disease?"  (e.g., blood clots in lung, asthma, emphysema, birth control pills)     No   Smokes 9. CAUSE: "What do you think is causing the chest pain?"     Don't know 10. OTHER SYMPTOMS: "Do you have any other symptoms?" (e.g., dizziness, nausea, vomiting, sweating, fever, difficulty breathing, cough)       No other symptoms when chest pain occurring. 11. PREGNANCY: "Is there any chance you are pregnant?" "When was your last menstrual period?"       N/A  Protocols used: HEART RATE AND HEARTBEAT QUESTIONS-A-AH, CHEST PAIN-A-AH

## 2018-03-20 NOTE — Telephone Encounter (Signed)
Called patient to follow-up on the sx's he was having on 03/16/2018. Spoke with patient and he stated that he has been feeling good other than the slight stomach pain that he has been having. Patient offered an appointment, but declined stating that if his stomach pain isn't any better in about 2 days he would call to make an appointment.   Copied from [place] [id number]. Topic: General - Other >> Mar 20, 2018  9:35 AM Yvette Rack wrote: Reason for CRM: Pt returned call to office. Pt requests call back. Cb# [phone]

## 2018-03-20 NOTE — Telephone Encounter (Signed)
Spoke with patient and he stated that he has been feeling good other than the slight stomach pain that he has been having. Patient offered an appointment, but declined stating that if his stomach pain isn't any better in about 2 days he would call to make an appointment.

## 2018-03-20 NOTE — Telephone Encounter (Signed)
Left message for patient to return call to office. Called to follow-up with patient.

## 2018-03-21 DIAGNOSIS — Z9181 History of falling: Secondary | ICD-10-CM | POA: Diagnosis not present

## 2018-03-21 DIAGNOSIS — M5431 Sciatica, right side: Secondary | ICD-10-CM | POA: Diagnosis not present

## 2018-03-21 DIAGNOSIS — M6281 Muscle weakness (generalized): Secondary | ICD-10-CM | POA: Diagnosis not present

## 2018-03-21 DIAGNOSIS — M150 Primary generalized (osteo)arthritis: Secondary | ICD-10-CM | POA: Diagnosis not present

## 2018-03-21 DIAGNOSIS — M48062 Spinal stenosis, lumbar region with neurogenic claudication: Secondary | ICD-10-CM | POA: Diagnosis not present

## 2018-03-21 DIAGNOSIS — Z720 Tobacco use: Secondary | ICD-10-CM | POA: Diagnosis not present

## 2018-03-21 DIAGNOSIS — G894 Chronic pain syndrome: Secondary | ICD-10-CM | POA: Diagnosis not present

## 2018-03-21 DIAGNOSIS — I10 Essential (primary) hypertension: Secondary | ICD-10-CM | POA: Diagnosis not present

## 2018-03-22 ENCOUNTER — Telehealth: Payer: Self-pay

## 2018-03-22 NOTE — Telephone Encounter (Signed)
Copied from [place] [id number]. Topic: General - Other >> Mar 21, 2018  5:51 PM Windy Kalata wrote: Reason for CRM: [name] [name] from [facility] home [facility] patient fell hit his head he is fine, she told him if he has any symptoms to call the on call nurse  Best call back is [phone]

## 2018-03-23 NOTE — Telephone Encounter (Signed)
Message sent to Dr. Jordan for review. 

## 2018-03-26 ENCOUNTER — Telehealth: Payer: Self-pay | Admitting: Family Medicine

## 2018-03-26 NOTE — Telephone Encounter (Signed)
Can you please check on [name] to inquire about new problems after recent fall. Thanks, BJ

## 2018-03-26 NOTE — Telephone Encounter (Signed)
Copied from [place] [id number]. Topic: Quick Communication - Home Health Verbal Orders >> Mar 26, 2018  4:48 PM Windy Kalata wrote: Caller/Agency: [name] from [place] Number: [phone] Requesting OT/PT/Skilled Nursing/Social Work: Needs to discharge on 03/27/18 Frequency:

## 2018-03-26 NOTE — Telephone Encounter (Signed)
Left message for patient to return call. Left detailed message informing patient that I was calling to follow-up and see how he was doing.

## 2018-03-27 NOTE — Telephone Encounter (Signed)
Left detailed message informing patient that I was calling to follow-up and see how he was doing.

## 2018-03-27 NOTE — Telephone Encounter (Signed)
Spoke with [name] [name] and she stated that today is his last home health visit and she wanted to let the office know that he is supposed to get discharged. Nothing further needed at this time.

## 2018-03-27 NOTE — Telephone Encounter (Signed)
Left message to return call to clinic. 

## 2018-03-28 NOTE — Telephone Encounter (Signed)
Spoke with patient and he stated that he was doing okay, but had some aches and pains and wanted to come in next week to discuss. Patient scheduled for 04/04/2018.

## 2018-04-04 ENCOUNTER — Encounter: Payer: Self-pay | Admitting: Family Medicine

## 2018-04-04 ENCOUNTER — Ambulatory Visit (INDEPENDENT_AMBULATORY_CARE_PROVIDER_SITE_OTHER): Payer: Medicare Other | Admitting: Family Medicine

## 2018-04-04 VITALS — BP 140/70 | HR 87 | Temp 98.1°F | Resp 16 | Ht 68.0 in | Wt 146.2 lb

## 2018-04-04 DIAGNOSIS — R2681 Unsteadiness on feet: Secondary | ICD-10-CM | POA: Diagnosis not present

## 2018-04-04 DIAGNOSIS — M159 Polyosteoarthritis, unspecified: Secondary | ICD-10-CM | POA: Diagnosis not present

## 2018-04-04 DIAGNOSIS — F17200 Nicotine dependence, unspecified, uncomplicated: Secondary | ICD-10-CM

## 2018-04-04 NOTE — Patient Instructions (Signed)
A few things to remember from today's visit:   Generalized osteoarthritis of multiple sites  Unstable gait  Tobacco use disorder   Osteoarthritis  Osteoarthritis is a type of arthritis that affects tissue that covers the ends of bones in joints (cartilage). Cartilage acts as a cushion between the bones and helps them move smoothly. Osteoarthritis results when cartilage in the joints gets worn down. Osteoarthritis is sometimes called "wear and tear" arthritis. Osteoarthritis is the most common form of arthritis. It often occurs in older people. It is a condition that gets worse over time (a progressive condition). Joints that are most often affected by this condition are in:  Fingers.  Toes.  Hips.  Knees.  Spine, including neck and lower back. What are the causes? This condition is caused by age-related wearing down of cartilage that covers the ends of bones. What increases the risk? The following factors may make you more likely to develop this condition:  Older age.  Being overweight or obese.  Overuse of joints, such as in athletes.  Past injury of a joint.  Past surgery on a joint.  Family history of osteoarthritis. What are the signs or symptoms? The main symptoms of this condition are pain, swelling, and stiffness in the joint. The joint may lose its shape over time. Small pieces of bone or cartilage may break off and float inside of the joint, which may cause more pain and damage to the joint. Small deposits of bone (osteophytes) may grow on the edges of the joint. Other symptoms may include:  A grating or scraping feeling inside the joint when you move it.  Popping or creaking sounds when you move. Symptoms may affect one or more joints. Osteoarthritis in a major joint, such as your knee or hip, can make it painful to walk or exercise. If you have osteoarthritis in your hands, you might not be able to grip items, twist your hand, or control small movements of  your hands and fingers (fine motor skills). How is this diagnosed? This condition may be diagnosed based on:  Your medical history.  A physical exam.  Your symptoms.  X-rays of the affected joint(s).  Blood tests to rule out other types of arthritis. How is this treated? There is no cure for this condition, but treatment can help to control pain and improve joint function. Treatment plans may include:  A prescribed exercise program that allows for rest and joint relief. You may work with a physical therapist.  A weight control plan.  Pain relief techniques, such as: ? Applying heat and cold to the joint. ? Electric pulses delivered to nerve endings under the skin (transcutaneous electrical nerve stimulation, or TENS). ? Massage. ? Certain nutritional supplements.  NSAIDs or prescription medicines to help relieve pain.  Medicine to help relieve pain and inflammation (corticosteroids). This can be given by mouth (orally) or as an injection.  Assistive devices, such as a brace, wrap, splint, specialized glove, or cane.  Surgery, such as: ? An osteotomy. This is done to reposition the bones and relieve pain or to remove loose pieces of bone and cartilage. ? Joint replacement surgery. You may need this surgery if you have very bad (advanced) osteoarthritis. Follow these instructions at home: Activity  Rest your affected joints as directed by your health care provider.  Do not drive or use heavy machinery while taking prescription pain medicine.  Exercise as directed. Your health care provider or physical therapist may recommend specific types of exercise,  such as: ? Strengthening exercises. These are done to strengthen the muscles that support joints that are affected by arthritis. They can be performed with weights or with exercise bands to add resistance. ? Aerobic activities. These are exercises, such as brisk walking or water aerobics, that get your heart  pumping. ? Range-of-motion activities. These keep your joints easy to move. ? Balance and agility exercises. Managing pain, stiffness, and swelling      If directed, apply heat to the affected area as often as told by your health care provider. Use the heat source that your health care provider recommends, such as a moist heat pack or a heating pad. ? If you have a removable assistive device, remove it as told by your health care provider. ? Place a towel between your skin and the heat source. If your health care provider tells you to keep the assistive device on while you apply heat, place a towel between the assistive device and the heat source. ? Leave the heat on for 20-30 minutes. ? Remove the heat if your skin turns bright red. This is especially important if you are unable to feel pain, heat, or cold. You may have a greater risk of getting burned.  If directed, put ice on the affected joint: ? If you have a removable assistive device, remove it as told by your health care provider. ? Put ice in a plastic bag. ? Place a towel between your skin and the bag. If your health care provider tells you to keep the assistive device on during icing, place a towel between the assistive device and the bag. ? Leave the ice on for 20 minutes, 2-3 times a day. General instructions  Take over-the-counter and prescription medicines only as told by your health care provider.  Maintain a healthy weight. Follow instructions from your health care provider for weight control. These may include dietary restrictions.  Do not use any products that contain nicotine or tobacco, such as cigarettes and e-cigarettes. These can delay bone healing. If you need help quitting, ask your health care provider.  Use assistive devices as directed by your health care provider.  Keep all follow-up visits as told by your health care provider. This is important. Where to find more information  [employer] of  Arthritis and Musculoskeletal and Skin Diseases: www.niams.SouthExposed.es  [employer] on Aging: http://kim-miller.com/  American College of Rheumatology: www.rheumatology.org Contact a health care provider if:  Your skin turns red.  You develop a rash.  You have pain that gets worse.  You have a fever along with joint or muscle aches. Get help right away if:  You lose a lot of weight.  You suddenly lose your appetite.  You have night sweats. Summary  Osteoarthritis is a type of arthritis that affects tissue covering the ends of bones in joints (cartilage).  This condition is caused by age-related wearing down of cartilage that covers the ends of bones.  The main symptom of this condition is pain, swelling, and stiffness in the joint.  There is no cure for this condition, but treatment can help to control pain and improve joint function. This information is not intended to replace advice given to you by your health care provider. Make sure you discuss any questions you have with your health care provider. Document Released: 01/17/2005 Document Revised: 10/24/2016 Document Reviewed: 09/21/2015 Elsevier Interactive Patient Education  2019 [employer].  Please be sure medication list is accurate. If a new problem present, please  set up appointment sooner than planned today.

## 2018-04-04 NOTE — Progress Notes (Signed)
HPI:  Chief Complaint  Patient presents with  . Follow-up    [name] [name] is a 78 y.o. male, who is here today to follow on some chronic medical problems. He is also concerned about frequent falls. He was last seen on 02/27/18 for ER visit.  Since his last visit he had another fall about 18 days ago, when he was helping his sister to carry groceries to her apartment. He was walking on the driveway ,lost tripped with uneven pavement and fell. He denies head trauma or serious injury. He needed help to get up.  He is c/o arthralgias,LE weakness,and stiffness. Unstable gait,he has completed PT. He is using a cane, has a walker at home. He cannot bring walker with him because it does not fit in his truck. He still has thing that belong to his wife,who has been deceased for about 3 years.  Cymbalta 60 mg has helped with arthralgias but frustrated because he is still having pain. Joint pain is worse with cold weather.  Chronic pain,he is on Oxycodone,follows with pain clinic.  He is still smoking but has decreased number of cig/day.  Review of Systems  Constitutional: Negative for appetite change, fatigue and fever.  Respiratory: Negative for cough, shortness of breath and wheezing.   Gastrointestinal: Negative for abdominal pain, nausea and vomiting.  Musculoskeletal: Positive for arthralgias, back pain and gait problem.  Skin: Negative for rash.  Neurological: Negative for syncope, speech difficulty and headaches.  Psychiatric/Behavioral: Negative for confusion. The patient is nervous/anxious.     Current Outpatient Medications on File Prior to Visit  Medication Sig Dispense Refill  . atorvastatin (LIPITOR) 10 MG tablet Take 1 tablet (10 mg total) by mouth daily. 90 tablet 3  . DULoxetine (CYMBALTA) 60 MG capsule Take 1 capsule (60 mg total) by mouth daily. 90 capsule 1  . lidocaine (XYLOCAINE) 5 % ointment Apply 1 application topically 2 (two) times  daily as needed. 50 g 2  . nicotine (NICODERM CQ - DOSED IN MG/24 HOURS) 21 mg/24hr patch Place 1 patch (21 mg total) onto the skin daily. 28 patch 0  . Oxycodone HCl 10 MG TABS Take 10 mg by mouth 2 (two) times daily.     . solifenacin (VESICARE) 5 MG tablet Take 1 tablet (5 mg total) by mouth daily. 90 tablet 2  . tamsulosin (FLOMAX) 0.4 MG CAPS capsule Take 1 capsule (0.4 mg total) by mouth daily. 90 capsule 2   No current facility-administered medications on file prior to visit.     Past Medical History:  Diagnosis Date  . Back pain   . Renal disorder    kidney stones  . Spinal stenosis, lumbar region, with neurogenic claudication 07/07/2014   No Known Allergies  Social History   Socioeconomic History  . Marital status: Widowed    Spouse name: Not on file  . Number of children: Not on file  . Years of education: Not on file  . Highest education level: Not on file  Occupational History  . Not on file  Social Needs  . Financial resource strain: Not on file  . Food insecurity:    Worry: Not on file    Inability: Not on file  . Transportation needs:    Medical: Not on file    Non-medical: Not on file  Tobacco Use  . Smoking status: Current Every Day Smoker    Packs/day: 0.75    Types: Cigarettes  .  Smokeless tobacco: Never Used  Substance and Sexual Activity  . Alcohol use: No  . Drug use: Never  . Sexual activity: Not on file  Lifestyle  . Physical activity:    Days per week: Not on file    Minutes per session: Not on file  . Stress: Not on file  Relationships  . Social connections:    Talks on phone: Not on file    Gets together: Not on file    Attends religious service: Not on file    Active member of club or organization: Not on file    Attends meetings of clubs or organizations: Not on file    Relationship status: Not on file  Other Topics Concern  . Not on file  Social History Narrative  . Not on file    Vitals:   04/04/18 1342  BP: 140/70    Pulse: 87  Resp: 16  Temp: 98.1 F (36.7 C)  SpO2: 93%   Body mass index is 22.24 kg/m.   Physical Exam  Nursing note and vitals reviewed. Constitutional: He is oriented to person, place, and time. He appears well-developed and well-nourished. No distress.  HENT:  Head: Normocephalic and atraumatic.  Eyes: Conjunctivae are normal.  Cardiovascular: Normal rate and regular rhythm.  No murmur heard. Respiratory: Effort normal and breath sounds normal. No respiratory distress.  GI: Soft. There is no abdominal tenderness.  Musculoskeletal:        General: No edema.     Right shoulder: He exhibits decreased range of motion and tenderness (with movement). He exhibits no bony tenderness.     Left shoulder: He exhibits decreased range of motion and tenderness (with movement). He exhibits no bony tenderness.  Neurological: He is alert and oriented to person, place, and time. Gait abnormal.  No focal deficit appreciated.  Skin: Skin is warm. No rash noted. No erythema.  Psychiatric: His mood appears anxious.  Well groomed, good eye contact.    ASSESSMENT AND PLAN:  [name] was seen today for follow-up.  Diagnoses and all orders for this visit:  Generalized osteoarthritis of multiple sites We discussed again natural Hx of the disease and prognosis. He was hoping that it was a cure. He could try OTC supplements like tumeric with black pepper,fish oil among some.  No changes in Cymbalta 60 mg daily,which seems to help.   Unstable gait We discussed etiologies, OA,radicular pain,and medications. Strongly recommend using his walker. His son may be coming in a few days,recommend asking him for help to clean his truck and open space for his walker. Fall precautions discussed. . Tobacco use disorder Encouraged to continue working on smoking cessation. Adverse effects of tobacco reviewed.    29 min face to face OV. > 50% was dedicated to discussion of Dx, prognosis, treatment  options, and some side effects of medications. He also had questions about Medicare coverage and services.  Return in about 5 months (around 09/04/2018) for F/U. Needs Medicare visit.   [name], MD  [facility]. [place] office.

## 2018-04-07 ENCOUNTER — Encounter: Payer: Self-pay | Admitting: Family Medicine

## 2018-05-02 ENCOUNTER — Telehealth: Payer: Self-pay | Admitting: Family Medicine

## 2018-05-02 NOTE — Telephone Encounter (Signed)
Copied from [place] [id number]. Topic: Quick Communication - Rx Refill/Question >> May 02, 2018 11:37 AM Sheran Luz wrote: Medication:  DULoxetine (CYMBALTA) 60 MG capsule  Patient is requesting a refill of this medication.   Preferred Pharmacy (with phone number or street name):[facility] [place], [place] AT [facility] [phone] (Phone) [phone] (Fax)

## 2018-05-03 NOTE — Telephone Encounter (Signed)
Message sent to Dr. Jordan for review and approval. 

## 2018-05-04 ENCOUNTER — Other Ambulatory Visit: Payer: Self-pay | Admitting: Family Medicine

## 2018-05-04 DIAGNOSIS — M159 Polyosteoarthritis, unspecified: Secondary | ICD-10-CM

## 2018-05-04 MED ORDER — DULOXETINE HCL 60 MG PO CPEP: 60.0000 mg | ORAL_CAPSULE | Freq: Every day | ORAL | 1 refills | Status: DC

## 2018-05-04 NOTE — Telephone Encounter (Signed)
Rx for Cymbalta 60 mg sent to his pharmacy. Thanks, BJ

## 2018-06-12 ENCOUNTER — Ambulatory Visit (INDEPENDENT_AMBULATORY_CARE_PROVIDER_SITE_OTHER): Payer: Medicare Other | Admitting: Family Medicine

## 2018-06-12 ENCOUNTER — Other Ambulatory Visit: Payer: Self-pay

## 2018-06-12 ENCOUNTER — Encounter: Payer: Self-pay | Admitting: Family Medicine

## 2018-06-12 DIAGNOSIS — R2681 Unsteadiness on feet: Secondary | ICD-10-CM | POA: Diagnosis not present

## 2018-06-12 DIAGNOSIS — R600 Localized edema: Secondary | ICD-10-CM

## 2018-06-12 DIAGNOSIS — M159 Polyosteoarthritis, unspecified: Secondary | ICD-10-CM | POA: Diagnosis not present

## 2018-06-12 DIAGNOSIS — I739 Peripheral vascular disease, unspecified: Secondary | ICD-10-CM | POA: Diagnosis not present

## 2018-06-12 NOTE — Assessment & Plan Note (Signed)
This is a chronic problem. We discussed possible etiologies,most likely venous related. We have checked renal function before and it has been in normal range,he would like this to be checked again. He is not interested in diuretics and has difficulty putting on compression stockings. Instructed about warning signs.

## 2018-06-12 NOTE — Assessment & Plan Note (Signed)
Fall precautions discussed. I think he needs to use his walker instead cane but he does not have space in his car to place it.

## 2018-06-12 NOTE — Assessment & Plan Note (Signed)
Recommend contacting Dr Ethel Rana office to request refills for Oxycodone 10 mg and to arrange a f/u appt. Side effects from opioid meds discussed. No changes in Cymbalta 60 mg.

## 2018-06-12 NOTE — Progress Notes (Signed)
Virtual Visit via Telephone Note  I connected with Jennye Moccasin on 06/12/18 at  3:00 PM EDT by telephone and verified that I am speaking with the correct person using two identifiers.   I discussed the limitations, risks, security and privacy concerns of performing an evaluation and management service by telephone and the availability of in person appointments. I also discussed with the patient that there may be a patient responsible charge related to this service. The patient expressed understanding and agreed to proceed.  Location patient: home Location provider: home office Participants present for the call: patient, provider Patient did not have a visit in the prior 7 days to address this/these issue(s).   History of Present Illness: Mr Paulson is a 78 yo male with Hx of chronic pain,PAD,OA,lumbar spine stenosis with neurogenic claudication,and tobacco use among some;who is c/o bilateral ankle edema noted  About 2-3 weeks ago. No Hx of trauma. He denies calves edema or erythema. He has had similar symptoms intermittently for a while. He is not sure about exacerbating or alleviating factors but states that leg swelling seems to be worse at night when he goes to bed. Furosemide has been recommended in the past, it increased urinary frequency, so he discontinued. He is not wearing compression stockings, he has a hard time putting them on and they are uncomfortable.  He denies fever, chills, unusual fatigue, change in appetite, chest pain, dyspnea, orthopnea, PND, palpitations, abdominal pain, nausea, vomiting, decreased urine output, gross hematuria, or foam in urine.  PAD, ABI done in 12/2017: Right:Resting right ankle-brachial index indicates mild right lower extremity arterial disease. The right toe-brachial index is abnormal. Left:Resting left ankle-brachial index indicates mild left lower extremity arterial disease. The left toe-brachial index is abnormal. Currently he  is on Aspirin 81 mg and atorvastatin 10 mg daily.  Negative for cyanosis or skin lesions.  Lumbar spinal stenosis with neurogenic claudication, pain is exacerbated by standing up or walking, alleviated by rest but pain is constant.  He has generalized arthralgias (neck, shoulders, hips, knees, and ankles), he is taking Cymbalta 60 mg,which has helped some with pain. Tolerating medication well.  A few days ago he almost fell when getting out of his care.He has been using his cane more frequent. He has his walker but cannot take it with him because does not have space in his truck. He has had a few falls in the past few months.  Also c/o ankle achy pain and requesting refills for Oxycodone 10 mg.  He has followed with Dr Primus Bravo for chronic pain, according to pt,he is now accepting his insurance and will continue providing care.   Observations/Objective: Patient sounds cheerful and well on the phone. I do not appreciate any SOB. Speech and thought processing are grossly intact. Patient reported vitals:N/A  Assessment and Plan: Orders Placed This Encounter  Procedures  . Basic metabolic panel  . CBC  . Urinalysis, Routine w reflex microscopic  . TSH     Bilateral lower extremity edema This is a chronic problem. We discussed possible etiologies,most likely venous related. We have checked renal function before and it has been in normal range,he would like this to be checked again. He is not interested in diuretics and has difficulty putting on compression stockings. Instructed about warning signs.   Unstable gait Fall precautions discussed. I think he needs to use his walker instead cane but he does not have space in his car to place it.  Generalized osteoarthritis of multiple sites  Recommend contacting Dr Ethel Rana office to request refills for Oxycodone 10 mg and to arrange a f/u appt. Side effects from opioid meds discussed. No changes in Cymbalta 60 mg.  PAD (peripheral  artery disease) (HCC)-Mild Instructed to monitor for skin color changes, feet/toes cyanosis. Continue Aspirin 81 mg daily and atorvastatin 10 mg daily. Good skin care. Instructed about warning signs.   Follow Up Instructions:  No changes in any of his meds. Lab appt will be arranged.   I discussed the assessment and treatment plan with the patient. The patient was provided an opportunity to ask questions and all were answered. The patient agreed with the plan and demonstrated an understanding of the instructions.   The patient was advised to call back or seek an in-person evaluation if the symptoms worsen or if the condition fails to improve as anticipated.  I provided 24 minutes of non-face-to-face time during this encounter.   Jacqui Headen [name], MD

## 2018-06-12 NOTE — Assessment & Plan Note (Signed)
Instructed to monitor for skin color changes, feet/toes cyanosis. Continue Aspirin 81 mg daily and atorvastatin 10 mg daily. Good skin care. Instructed about warning signs.

## 2018-06-13 ENCOUNTER — Other Ambulatory Visit (INDEPENDENT_AMBULATORY_CARE_PROVIDER_SITE_OTHER): Payer: Medicare Other

## 2018-06-13 ENCOUNTER — Other Ambulatory Visit: Payer: Self-pay

## 2018-06-13 DIAGNOSIS — R600 Localized edema: Secondary | ICD-10-CM | POA: Diagnosis not present

## 2018-06-13 LAB — BASIC METABOLIC PANEL WITH GFR
BUN: 13 mg/dL (ref 6–23)
CO2: 30 meq/L (ref 19–32)
Calcium: 8.8 mg/dL (ref 8.4–10.5)
Chloride: 104 meq/L (ref 96–112)
Creatinine, Ser: 1.14 mg/dL (ref 0.40–1.50)
GFR: 75.39 mL/min
Glucose, Bld: 85 mg/dL (ref 70–99)
Potassium: 4.5 meq/L (ref 3.5–5.1)
Sodium: 140 meq/L (ref 135–145)

## 2018-06-13 LAB — CBC
HCT: 44.6 % (ref 39.0–52.0)
Hemoglobin: 14.7 g/dL (ref 13.0–17.0)
MCHC: 33 g/dL (ref 30.0–36.0)
MCV: 81.7 fl (ref 78.0–100.0)
Platelets: 166 10*3/uL (ref 150.0–400.0)
RBC: 5.46 Mil/uL (ref 4.22–5.81)
RDW: 14.9 % (ref 11.5–15.5)
WBC: 7.4 10*3/uL (ref 4.0–10.5)

## 2018-06-13 LAB — TSH: TSH: 2.16 u[IU]/mL (ref 0.35–4.50)

## 2018-06-14 ENCOUNTER — Other Ambulatory Visit (HOSPITAL_COMMUNITY)
Admission: RE | Admit: 2018-06-14 | Discharge: 2018-06-14 | Disposition: A | Discharge status: Home / Self Care | Payer: Medicare Other | Source: Other Acute Inpatient Hospital | Attending: *Deleted | Admitting: *Deleted

## 2018-06-14 LAB — URINALYSIS, ROUTINE W REFLEX MICROSCOPIC
Bilirubin Urine: NEGATIVE
Glucose, UA: NEGATIVE mg/dL
Hgb urine dipstick: NEGATIVE
Ketones, ur: NEGATIVE mg/dL
Nitrite: NEGATIVE
Protein, ur: 30 mg/dL — AB
Specific Gravity, Urine: 1.017 (ref 1.005–1.030)
pH: 6 (ref 5.0–8.0)

## 2018-07-17 ENCOUNTER — Other Ambulatory Visit: Payer: Self-pay | Admitting: Family Medicine

## 2018-07-17 ENCOUNTER — Ambulatory Visit (INDEPENDENT_AMBULATORY_CARE_PROVIDER_SITE_OTHER): Payer: Medicare Other | Admitting: Family Medicine

## 2018-07-17 ENCOUNTER — Encounter: Payer: Self-pay | Admitting: Family Medicine

## 2018-07-17 ENCOUNTER — Other Ambulatory Visit: Payer: Self-pay

## 2018-07-17 VITALS — BP 180/100 | HR 50 | Temp 98.4°F | Resp 16 | Wt 145.8 lb

## 2018-07-17 DIAGNOSIS — I10 Essential (primary) hypertension: Secondary | ICD-10-CM

## 2018-07-17 DIAGNOSIS — R600 Localized edema: Secondary | ICD-10-CM | POA: Diagnosis not present

## 2018-07-17 DIAGNOSIS — M48062 Spinal stenosis, lumbar region with neurogenic claudication: Secondary | ICD-10-CM | POA: Diagnosis not present

## 2018-07-17 MED ORDER — LOSARTAN POTASSIUM 50 MG PO TABS: 50.0000 mg | ORAL_TABLET | Freq: Every day | ORAL | 1 refills | Status: DC

## 2018-07-17 MED ORDER — FUROSEMIDE 20 MG PO TABS: 20.0000 mg | ORAL_TABLET | Freq: Every day | ORAL | 3 refills | Status: DC

## 2018-07-17 NOTE — Progress Notes (Signed)
HPI:  Chief Complaint  Patient presents with  . Foot Swelling    both legs and feet, x 2 weeks, throbbing pain    [name] is a 78 y.o. male, who is here today complaining of lower extremities edema. He has had problem for years. For the past 2 weeks LE edema seems to be worse. Pain LE same than his usual. Throbbing like severe pain. He has not noted erythema or skin lesions.  Pain and edema are worse in the morning when first gets up. + Stiffness.  Hx chronic pain,PAD,and neurologic claudication. He follows with pain management. Cymbalta 60 mg has helped with arthralgias and back pain. He did not tolerate Gabapentin.  Denies orthopnea or PND.  BNP and BMP normal a few months ago.  Lab Results  Component Value Date   TSH 2.16 06/13/2018    Denies decreased urine output,gross hematuria,or foam in urine.  In the past Furosemide has been recommended but he discontinued because aggravated urine frequency. + Urgency. He is on Flomax 0.4 mg daily and vesicare 5 mg daily,last visit he reported that medications were helping with nocturia but today he is not sure.  Today BP is elevated. Reviewing some of his recent visits BP has been mildly elevated. Parietal pressure headache x 2 weeks,"not bad." Denies associated visual changes,nausea,vomiting,or focal deficit. Negative for chest pain,dyspnea,diaphoresis,or dizziness.   Review of Systems  Constitutional: Negative for chills, fatigue and fever.  HENT: Negative for nosebleeds and sore throat.   Respiratory: Negative for cough and wheezing.   Gastrointestinal: Negative for abdominal pain.       No changes in bowel habits.  Musculoskeletal: Positive for gait problem.  Skin: Negative for pallor.  Neurological: Negative for facial asymmetry and speech difficulty.  Psychiatric/Behavioral: Positive for sleep disturbance. Negative for confusion.  Rest see pertinent positives and negatives per  HPI.   Current Outpatient Medications on File Prior to Visit  Medication Sig Dispense Refill  . atorvastatin (LIPITOR) 10 MG tablet Take 1 tablet (10 mg total) by mouth daily. 90 tablet 3  . DULoxetine (CYMBALTA) 60 MG capsule Take 1 capsule (60 mg total) by mouth daily. 90 capsule 1  . lidocaine (XYLOCAINE) 5 % ointment Apply 1 application topically 2 (two) times daily as needed. 50 g 2  . nicotine (NICODERM CQ - DOSED IN MG/24 HOURS) 21 mg/24hr patch Place 1 patch (21 mg total) onto the skin daily. 28 patch 0  . Oxycodone HCl 10 MG TABS Take 10 mg by mouth 2 (two) times daily.     . solifenacin (VESICARE) 5 MG tablet Take 1 tablet (5 mg total) by mouth daily. 90 tablet 2  . tamsulosin (FLOMAX) 0.4 MG CAPS capsule Take 1 capsule (0.4 mg total) by mouth daily. 90 capsule 2   No current facility-administered medications on file prior to visit.      Past Medical History:  Diagnosis Date  . Back pain   . Renal disorder    kidney stones  . Spinal stenosis, lumbar region, with neurogenic claudication 07/07/2014   No Known Allergies  Social History   Socioeconomic History  . Marital status: Widowed    Spouse name: Not on file  . Number of children: Not on file  . Years of education: Not on file  . Highest education level: Not on file  Occupational History  . Not on file  Social Needs  . Financial resource strain: Not on file  .  Food insecurity    Worry: Not on file    Inability: Not on file  . Transportation needs    Medical: Not on file    Non-medical: Not on file  Tobacco Use  . Smoking status: Current Every Day Smoker    Packs/day: 0.75    Types: Cigarettes  . Smokeless tobacco: Never Used  Substance and Sexual Activity  . Alcohol use: No  . Drug use: Never  . Sexual activity: Not on file  Lifestyle  . Physical activity    Days per week: Not on file    Minutes per session: Not on file  . Stress: Not on file  Relationships  . Social Herbalist on phone:  Not on file    Gets together: Not on file    Attends religious service: Not on file    Active member of club or organization: Not on file    Attends meetings of clubs or organizations: Not on file    Relationship status: Not on file  Other Topics Concern  . Not on file  Social History Narrative  . Not on file    Vitals:   07/17/18 1447  BP: (!) 180/100  Pulse: (!) 50  Resp: 16  Temp: 98.4 F (36.9 C)   Body mass index is 22.17 kg/m.  Physical Exam  Nursing note reviewed. Constitutional: He is oriented to person, place, and time. He appears well-developed. No distress.  HENT:  Head: Normocephalic and atraumatic.  Mouth/Throat: Oropharynx is clear and moist and mucous membranes are normal.  Eyes: Pupils are equal, round, and reactive to light. Conjunctivae are normal.  Cardiovascular: Normal rate and regular rhythm.  No murmur heard. DP pulses are present bilateral. HR 60/min by my count.  Respiratory: Effort normal and breath sounds normal. No respiratory distress.  GI: Soft. He exhibits no mass. There is no abdominal tenderness.  Musculoskeletal:        General: Edema (trace pitting LE edema and 2+ pedal edema,bilateral.) present. No tenderness.  Lymphadenopathy:    He has no cervical adenopathy.  Neurological: He is alert and oriented to person, place, and time. He has normal strength. Gait abnormal.  Gait assisted by a cane.  Skin: Skin is warm. No rash noted. No erythema.  Psychiatric: He has a normal mood and affect.  Well groomed, good eye contact.    ASSESSMENT AND PLAN:  [name] was seen today for foot swelling.  Diagnoses and all orders for this visit:  Lab Results  Component Value Date   CREATININE 1.24 07/17/2018   BUN 13 07/17/2018   NA 141 07/17/2018   K 4.7 07/17/2018   CL 103 07/17/2018   CO2 31 07/17/2018    Bilateral lower extremity edema This is a chronic problem, worse for the past couple weeks. We have discussed possible  etiologies. ABI 12/2017 mild bilateral PAD. LE elevation above waist level, compression stocking recommended. Good skin care and avoid trauma. We discussed side effects of Furosemide,he agrees with trying for a few days daily then prn. K+ rich diet while taking diuretic.  Rx for bedside commode given.  -     Brain Natriuretic Peptide -     furosemide (LASIX) 20 MG tablet; Take 1 tablet (20 mg total) by mouth daily.  Hypertension, essential, benign Not well controlled. Possible complications of elevated BP discussed. Recommend trying Cozaar 50 mg daily,if dizziness he can decrease dose to 1/2 tab. Low salt diet recommended. Monitoring BP at  home recommended. Clearly instructed about warning signs. F/U in 4 weeks,befire is needed,will repeat BMP then.  -     Basic metabolic panel -     losartan (COZAAR) 50 MG tablet; Take 1 tablet (50 mg total) by mouth daily.  Spinal stenosis, lumbar region, with neurogenic claudication This problem could be contributing to LE pain as well as OA. Currently he is on Oxycodone, continue following with pain management. Continue Cymbalta 60 mg. He did not tolerate Gabapentin.   Return in about 4 weeks (around 08/14/2018) for HTN.   -[name] was advised to seek immediate medical attention if sudden worsening symptoms or to follow if they persist or if new concerns arise.       Abbott Jasinski G. [name], MD  [facility]. [place] office.

## 2018-07-17 NOTE — Patient Instructions (Signed)
A few things to remember from today's visit:   Bilateral lower extremity edema - Plan: Brain Natriuretic Peptide, furosemide (LASIX) 20 MG tablet  Hypertension, essential, benign - Plan: Basic metabolic panel, losartan (COZAAR) 50 MG tablet  Losartan 50 mg and furosemide 20 mg added today. It is important to monitor blood pressure regularly at home. Low-salt diet. I would like to see her back in 4 weeks.  Please be sure medication list is accurate. If a new problem present, please set up appointment sooner than planned today.

## 2018-07-18 LAB — BASIC METABOLIC PANEL
BUN: 13 mg/dL (ref 6–23)
CO2: 31 mEq/L (ref 19–32)
Calcium: 9.2 mg/dL (ref 8.4–10.5)
Chloride: 103 mEq/L (ref 96–112)
Creatinine, Ser: 1.24 mg/dL (ref 0.40–1.50)
GFR: 68.4 mL/min (ref >60.00)
Glucose, Bld: 80 mg/dL (ref 70–99)
Potassium: 4.7 mEq/L (ref 3.5–5.1)
Sodium: 141 mEq/L (ref 135–145)

## 2018-07-18 LAB — BRAIN NATRIURETIC PEPTIDE: Pro B Natriuretic peptide (BNP): 53 pg/mL (ref 0.0–100.0)

## 2018-07-19 ENCOUNTER — Encounter: Payer: Self-pay | Admitting: Family Medicine

## 2018-07-30 ENCOUNTER — Other Ambulatory Visit: Payer: Self-pay | Admitting: Family Medicine

## 2018-07-30 DIAGNOSIS — I10 Essential (primary) hypertension: Secondary | ICD-10-CM

## 2018-07-31 ENCOUNTER — Encounter: Payer: Self-pay | Admitting: Family Medicine

## 2018-07-31 ENCOUNTER — Ambulatory Visit (INDEPENDENT_AMBULATORY_CARE_PROVIDER_SITE_OTHER): Payer: Medicare Other | Admitting: Family Medicine

## 2018-07-31 ENCOUNTER — Other Ambulatory Visit: Payer: Self-pay

## 2018-07-31 DIAGNOSIS — G894 Chronic pain syndrome: Secondary | ICD-10-CM

## 2018-07-31 DIAGNOSIS — M159 Polyosteoarthritis, unspecified: Secondary | ICD-10-CM | POA: Diagnosis not present

## 2018-07-31 DIAGNOSIS — M48062 Spinal stenosis, lumbar region with neurogenic claudication: Secondary | ICD-10-CM | POA: Diagnosis not present

## 2018-07-31 MED ORDER — OXYCODONE HCL 10 MG PO TABS: 10.0000 mg | ORAL_TABLET | Freq: Two times a day (BID) | ORAL | 0 refills | Status: DC | PRN

## 2018-07-31 NOTE — Assessment & Plan Note (Signed)
Continue Cymbalta 60 mg daily. Oxycodone 10 mg bid to resume.

## 2018-07-31 NOTE — Assessment & Plan Note (Signed)
We reviewed side effects of oxycodone including increasing risk for falls. He tried Gabapentin in the past but he did not want to continue because side effects. Continue Cymbalta. Fall precautions. Keep next appt.

## 2018-07-31 NOTE — Progress Notes (Signed)
Virtual Visit via Telephone Note  I connected with [name] [name] on 07/31/18 at  3:30 PM EDT by telephone and verified that I am speaking with the correct person using two identifiers.   I discussed the limitations, risks, security and privacy concerns of performing an evaluation and management service by telephone and the availability of in person appointments. I also discussed with the patient that there may be a patient responsible charge related to this service. The patient expressed understanding and agreed to proceed.  Location patient: home Location provider: work or home office Participants present for the call: patient, provider Patient did not have a visit in the prior 7 days to address this/these issue(s).   History of Present Illness: [name] [name] is a 78 yo male with hx of chronic pain,LE edema,and OA.  He followed with [facility],[name] [name] until a few months ago. Because health insurance changes he cannot longer see [name] [name]. He was prescribed Oxycodone 10 mg tid as needed but he usually takes 1 tab bid.  Last refill 11/13/2017 # 50 tab. States that he had tabs left from older prescription it is why he did not need refills earlier. He is in a lot of pain,mainly lower back pain left side.  Generalized OA,he is on Cymbalta 60 mg daily. Neck,shoulders,knees,and hips mainly. Shoulder limitations of ROM. Pain exacerbated by movement,overhead activities.Sometimes pain interferes with sleep.  + Stiffness. Last time he was evaluated by ortho,[name] [name], 02/01/18.  C/O severe constant pain having "bad" pain since she ran out of Oxycodone. Now at rest pain 7/10,achy like pain. Pain is radiated to LE,burning like sensation and numbness. Denies saddle anesthesia or changes in urine/bowel continence.  He has to bend forward to alleviate pain some.  Pain is exacerbated by walkging,standing. Limited ADL's due to pain.  He was tolerating Oxycodone well, no major side  effects. Denies fever,chills,unusual fatigue,abdominal pain,nausea,vomiting,changes in bowel habits,gross hematuria,or dysuria. No recent trauma.   Observations/Objective: Patient sounds cheerful and well on the phone. I do not appreciate any SOB. Speech and thought processing are grossly intact. Patient reported vitals:N/A  Assessment and Plan:  Spinal stenosis, lumbar region, with neurogenic claudication We reviewed side effects of oxycodone including increasing risk for falls. He tried Gabapentin in the past but he did not want to continue because side effects. Continue Cymbalta. Fall precautions. Keep next appt.  Generalized osteoarthritis of multiple sites Continue Cymbalta 60 mg daily. Oxycodone 10 mg bid to resume.   Chronic pain disorder We discussed current guidelines for chronic pain management and chronic opioid use. We will discuss and sing med contract next OV. [place] controlled substance report reviewed,last Rx filled in 10/2017.   Follow Up Instructions:  Reminded him about next appt,09/05/2018 at 2:30 pm.  I did not refer this patient for an OV in the next 24 hours for this/these issue(s).  I discussed the assessment and treatment plan with the patient. He was provided an opportunity to ask questions and all were answered. The patient agreed with the plan and demonstrated an understanding of the instructions.   I provided 22 minutes of non-face-to-face time during this encounter.   [name] Coggeshall [name], MD

## 2018-07-31 NOTE — Assessment & Plan Note (Signed)
We discussed current guidelines for chronic pain management and chronic opioid use. We will discuss and sing med contract next OV. [place] controlled substance report reviewed,last Rx filled in 10/2017.

## 2018-08-06 ENCOUNTER — Telehealth: Payer: Self-pay

## 2018-08-06 NOTE — Telephone Encounter (Signed)
PA for Oxycodone HCl 10 MG TABS has been sent to cover my meds.    KeyVictorino Sparrow - PA Case ID: [id number] - Rx #: [id number]

## 2018-08-13 ENCOUNTER — Other Ambulatory Visit: Payer: Self-pay

## 2018-08-13 ENCOUNTER — Ambulatory Visit (INDEPENDENT_AMBULATORY_CARE_PROVIDER_SITE_OTHER): Payer: Medicare Other | Admitting: Family Medicine

## 2018-08-13 ENCOUNTER — Encounter: Payer: Self-pay | Admitting: Family Medicine

## 2018-08-13 DIAGNOSIS — I10 Essential (primary) hypertension: Secondary | ICD-10-CM

## 2018-08-13 DIAGNOSIS — R42 Dizziness and giddiness: Secondary | ICD-10-CM

## 2018-08-13 DIAGNOSIS — H9313 Tinnitus, bilateral: Secondary | ICD-10-CM

## 2018-08-13 MED ORDER — MECLIZINE HCL 25 MG PO TABS: 25.0000 mg | ORAL_TABLET | Freq: Three times a day (TID) | ORAL | 0 refills | Status: DC | PRN

## 2018-08-13 NOTE — Progress Notes (Signed)
Virtual Visit via Telephone Note  I connected with [name] [name] on 08/16/18 at  4:00 PM EDT by telephone and verified that I am speaking with the correct person using two identifiers.   I discussed the limitations, risks, security and privacy concerns of performing an evaluation and management service by telephone and the availability of in person appointments. I also discussed with the patient that there may be a patient responsible charge related to this service. The patient expressed understanding and agreed to proceed.  Location patient: home Location provider: work office Participants present for the call: patient, provider Patient did not have a visit in the prior 7 days to address this/these issue(s).   History of Present Illness: Mr Baria is a 78 yo male with Hx of chronic pain disorder,back pain with radicular pain + generalized OA who is complaining about 2 to 3 days of dizziness, which he attributes to Cymbalta. He has been on Cymbalta 60 mg to help with chronic pain and so far he has tolerated well. Also takes Oxycodone 10 mg bid for years. He discontinued all his medications, he wants to wait until dizziness is resolved before resuming meds.  She describes dizziness as a spinning sensation. Bilateral tinnitus, which he reports as new. No changes in hearing. Negative for fever, chills, earache, ear drainage, sore throat, nasal congestion, rhinorrhea, worsening cough, dyspnea, or wheezing. He has not tried OTC medication.  Hypertension, he is not taking Cozaar 25 mg. He is not checking BP at home. Negative for unusual headache, focal deficit, or worsening edema.  Observations/Objective: Patient does not sound like he is in acute distress, seems to be well on the phone. Mildly anxious. I do not appreciate any SOB. Speech and thought processing are grossly intact.Oriented x 3. Patient reported vitals:N/A  Assessment and Plan: 1. Vertigo Educated about  diagnosis. Some of his medications could aggravate problem. We will arrange [facility] for vestibular exercises as well as BP monitoring. Fall precautions discussed. Instructed about warning signs.  - Ambulatory referral to [place]  2. Tinnitus of both ears No changes in hearing. For now we will hold on ENT referral,he agrees. We will re-evaluate after completing vestibular therapy. Instructed about warning signs.  3. Hypertension, essential, benign Possible complications of elevated BP discussed. Stressed the importance of compliance with medications. Monitor BP regularly. Keep next f/u appt.  - Ambulatory referral to Home Health  Follow Up Instructions:  Voices understanding. Instructed to keep next appt.  I did not refer this patient for an OV in the next 24 hours for this/these issue(s).  I discussed the assessment and treatment plan with the patient. He was provided an opportunity to ask questions and all were answered. He agreed with the plan and demonstrated an understanding of the instructions.   He was advised to call back or seek an in-person evaluation if the symptoms worsen or if the condition fails to improve as anticipated.  I provided 16 minutes of non-face-to-face time during this encounter.   Tuan Tippin [name], MD

## 2018-09-05 ENCOUNTER — Ambulatory Visit: Payer: Medicare Other | Admitting: Family Medicine

## 2018-09-05 ENCOUNTER — Ambulatory Visit: Payer: Medicare Other

## 2018-10-04 ENCOUNTER — Telehealth: Payer: Self-pay | Admitting: Family Medicine

## 2018-10-04 NOTE — Telephone Encounter (Signed)
RX REFILL Oxycodone HCl 10 MG TABS  PHARMACY [facility] [id number], [place] (Phone) [phone] (Fax

## 2018-10-05 ENCOUNTER — Ambulatory Visit (INDEPENDENT_AMBULATORY_CARE_PROVIDER_SITE_OTHER): Payer: Medicare Other | Admitting: Family Medicine

## 2018-10-05 ENCOUNTER — Other Ambulatory Visit: Payer: Self-pay

## 2018-10-05 ENCOUNTER — Encounter: Payer: Self-pay | Admitting: Family Medicine

## 2018-10-05 VITALS — BP 112/74 | HR 70 | Temp 98.3°F | Ht 68.0 in | Wt 143.9 lb

## 2018-10-05 DIAGNOSIS — M48062 Spinal stenosis, lumbar region with neurogenic claudication: Secondary | ICD-10-CM | POA: Diagnosis not present

## 2018-10-05 DIAGNOSIS — R600 Localized edema: Secondary | ICD-10-CM | POA: Diagnosis not present

## 2018-10-05 MED ORDER — LIDOCAINE 5 % EX PTCH: 1.0000 | MEDICATED_PATCH | CUTANEOUS | 1 refills | Status: DC

## 2018-10-05 NOTE — Progress Notes (Signed)
Subjective:     Patient ID: [name] [name], male   DOB: 09-22-1940, 78 y.o.   MRN: [id number]  HPI Patient is seen in office with what sounds like two chronic issues.  First, he has had some bilateral foot and ankle and lower leg edema for several weeks.  His weight is actually down a couple pounds from March.  He had recent BNP level of 53.  No history of known systolic failure.  His edema is actually down today.  He has been prescribed furosemide in the past but has declined taking this regularly.  He had recent renal function back in June which is stable and last albumin was normal.  Denies any orthopnea or dyspnea with exertion.  Second issue is chronic back pain.  He has known history of spinal stenosis.  He is complaining of pain which is worse on the left side and worse with ambulation and somewhat bilateral.  Worse with changing positions and walking.  Already takes oxycodone twice daily.  Past Medical History:  Diagnosis Date  . Back pain   . Renal disorder    kidney stones  . Spinal stenosis, lumbar region, with neurogenic claudication 07/07/2014   Past Surgical History:  Procedure Laterality Date  . CATARACT EXTRACTION W/PHACO Left 08/22/2016   Procedure: CATARACT EXTRACTION PHACO AND INTRAOCULAR LENS PLACEMENT ([place]) Left;  Surgeon: [name], MD;  Location: [place];  Service: Ophthalmology;  Laterality: Left;  . CATARACT EXTRACTION W/PHACO Right 10/25/2016   Procedure: CATARACT EXTRACTION PHACO AND INTRAOCULAR LENS PLACEMENT ([place]) WITH ISTENT RIGHT  ;  Surgeon: [name], MD;  Location: [place];  Service: Ophthalmology;  Laterality: Right;  TOPICAL RIGHT  ISTENT   trabecular bypass stent was implanted/explanted.  . INGUINAL HERNIA REPAIR    . KNEE SURGERY Right   . NECK SURGERY      reports that he has been smoking cigarettes. He has been smoking about 0.75 packs per day. He has never used smokeless tobacco. He reports that he  does not drink alcohol or use drugs. family history includes Diabetes in his brother, mother, and sister. No Known Allergies   Review of Systems  Constitutional: Negative for chills, fatigue, fever and unexpected weight change.  Eyes: Negative for visual disturbance.  Respiratory: Negative for cough, chest tightness and shortness of breath.   Cardiovascular: Positive for leg swelling. Negative for chest pain and palpitations.  Genitourinary: Negative for dysuria.  Musculoskeletal: Positive for back pain.  Neurological: Negative for dizziness, syncope, weakness, light-headedness and headaches.       Objective:   Physical Exam Constitutional:      Appearance: Normal appearance.  Cardiovascular:     Rate and Rhythm: Normal rate and regular rhythm.  Pulmonary:     Effort: Pulmonary effort is normal.     Breath sounds: Normal breath sounds.  Musculoskeletal:     Comments: Does not have any pitting edema whatsoever today.  actually no edema in his lower legs feet or ankles  Neurological:     Mental Status: He is alert.     Comments: No focal strength deficits lower extremities.        Assessment:     #1 history of lumbar stenosis with neurogenic claudication  #2 complaints of bilateral leg edema which is actually very stable today-probably combination of venous stasis and diastolic dysfunction    Plan:     -Recommend trial of Lidoderm patch leave on 12 hours and off 12 hours -  Avoidance of nonsteroidals given his age and comorbidities -We discussed use of low-dose Lasix for his edema as needed but he is declining at this time. - he is already on oxycodone per primary for his chronic back pain and is encouraged to follow up with Dr [name]  [name] Bridwell W Yomaira Solar MD [facility]

## 2018-10-05 NOTE — Patient Instructions (Signed)
Spinal Stenosis  Spinal stenosis occurs when the open space (spinal canal) between the bones of your spine (vertebrae) narrows, putting pressure on the spinal cord or nerves. What are the causes? This condition is caused by areas of bone pushing into the central canals of your vertebrae. This condition may be present at birth (congenital), or it may be caused by:  Arthritic deterioration of your vertebrae (spinal degeneration). This usually starts around age 50.  Injury or trauma to the spine.  Tumors in the spine.  Calcium deposits in the spine. What are the signs or symptoms? Symptoms of this condition include:  Pain in the neck or back that is generally worse with activities, particularly when standing and walking.  Numbness, tingling, hot or cold sensations, weakness, or weariness in your legs.  Pain going up and down the leg (sciatica).  Frequent episodes of falling.  A foot-slapping gait that leads to muscle weakness. In more serious cases, you may develop:  Problemspassing stool or passing urine.  Difficulty having sex.  Loss of feeling in part or all of your leg. Symptoms may come on slowly and get worse over time. How is this diagnosed? This condition is diagnosed based on your medical history and a physical exam. Tests will also be done, such as:  MRI.  CT scan.  X-ray. How is this treated? Treatment for this condition often focuses on managing your pain and any other symptoms. Treatment may include:  Practicing good posture to lessen pressure on your nerves.  Exercising to strengthen muscles, build endurance, improve balance, and maintain good joint movement (range of motion).  Losing weight, if needed.  Taking medicines to reduce swelling, inflammation, or pain.  Assistive devices, such as a corset or brace. In some cases, surgery may be needed. The most common procedure is decompression laminectomy. This is done to remove excess bone that puts  pressure on your nerve roots. Follow these instructions at home: Managing pain, stiffness, and swelling  Do all exercises and stretches as told by your health care provider.  Practice good posture. If you were given a brace or a corset, wear it as told by your health care provider.  Do not do any activities that cause pain. Ask your health care provider what activities are safe for you.  Do not lift anything that is heavier than 10 lb (4.5 kg) or the limit that your health care provider tells you.  Maintain a healthy weight. Talk with your health care provider if you need help losing weight.  If directed, apply heat to the affected area as often as told by your health care provider. Use the heat source that your health care provider recommends, such as a moist heat pack or a heating pad. ? Place a towel between your skin and the heat source. ? Leave the heat on for 20-30 minutes. ? Remove the heat if your skin turns bright red. This is especially important if you are not able to feel pain, heat, or cold. You may have a greater risk of getting burned. General instructions  Take over-the-counter and prescription medicines only as told by your health care provider.  Do not use any products that contain nicotine or tobacco, such as cigarettes and e-cigarettes. If you need help quitting, ask your health care provider.  Eat a healthy diet. This includes plenty of fruits and vegetables, whole grains, and low-fat (lean) protein.  Keep all follow-up visits as told by your health care provider. This is important. Contact   a health care provider if:  Your symptoms do not get better or they get worse.  You have a fever. Get help right away if:  You have new or worse pain in your neck or upper back.  You have severe pain that cannot be controlled with medicines.  You are dizzy.  You have vision problems, blurred vision, or double vision.  You have a severe headache that is worse when you  stand.  You have nausea or you vomit.  You develop new or worse numbness or tingling in your back or legs.  You have pain, redness, swelling, or warmth in your arm or leg. Summary  Spinal stenosis occurs when the open space (spinal canal) between the bones of your spine (vertebrae) narrows. This narrowing puts pressure on the spinal cord or nerves.  Spinal stenosis can cause numbness, weakness, or pain in the neck, back, and legs.  This condition may be caused by a birth defect, arthritic deterioration of your vertebrae, injury, tumors, or calcium deposits.  This condition is usually diagnosed with MRIs, CT scans, and X-rays. This information is not intended to replace advice given to you by your health care provider. Make sure you discuss any questions you have with your health care provider. Document Released: 04/09/2003 Document Revised: 12/30/2016 Document Reviewed: 12/23/2015 Elsevier Patient Education  2020 Elsevier Inc.  

## 2018-10-09 NOTE — Telephone Encounter (Signed)
Pt calling to check status. Pt would like a call back from the nurse. Please advise  °

## 2018-10-09 NOTE — Telephone Encounter (Signed)
Pt called requesting update on medication refill

## 2018-10-10 ENCOUNTER — Ambulatory Visit: Payer: Medicare Other | Admitting: Family Medicine

## 2018-10-10 NOTE — Telephone Encounter (Signed)
Okay for refill?  

## 2018-10-12 ENCOUNTER — Other Ambulatory Visit: Payer: Self-pay | Admitting: Family Medicine

## 2018-10-12 DIAGNOSIS — M159 Polyosteoarthritis, unspecified: Secondary | ICD-10-CM

## 2018-10-12 DIAGNOSIS — G894 Chronic pain syndrome: Secondary | ICD-10-CM

## 2018-10-12 DIAGNOSIS — M48062 Spinal stenosis, lumbar region with neurogenic claudication: Secondary | ICD-10-CM

## 2018-10-12 MED ORDER — OXYCODONE HCL 10 MG PO TABS: 10.0000 mg | ORAL_TABLET | Freq: Two times a day (BID) | ORAL | 0 refills | Status: DC | PRN

## 2018-10-12 NOTE — Telephone Encounter (Signed)
Rx sent. Please remind him that he is due for follow up.  Thanks, BJ

## 2018-10-12 NOTE — Telephone Encounter (Signed)
Noted! Tried to call pt but no answer.

## 2018-10-19 NOTE — Telephone Encounter (Signed)
Patient had appointment on 10/05/2018.

## 2018-11-23 ENCOUNTER — Ambulatory Visit: Payer: Self-pay | Admitting: *Deleted

## 2018-11-23 NOTE — Telephone Encounter (Signed)
Patient's sister is reporting patient is unable to express himself. Today it seems worse. EMS has been out today- per sister-they ruled out stroke- patient refused to go to ED.Patient seems to be alert enough now to take himself to the bathroom. Explained to sister if patient declines to go to ED- then their is not a lot we can do- she may want to see if he will go to UC with her- may need appointment in office for follow up. Reason for Disposition . [1] Acting confused (e.g., disoriented, slurred speech) AND [2] brief (now gone)  Answer Assessment - Initial Assessment Questions 1. LEVEL OF CONSCIOUSNESS: "How is he (she, the patient) acting right now?" (e.g., alert-oriented, confused, lethargic, stuporous, comatose)     Confused 2. ONSET: "When did the confusion start?"  (minutes, hours, days)     Sister states her brother is having trouble getting thought out- not sure when started- this afternoon 3. PATTERN "Does this come and go, or has it been constant since it started?"  "Is it present now?"     Comes and goes 4. ALCOHOL or DRUGS: "Has he been drinking alcohol or taking any drugs?"      no 5. NARCOTIC MEDICATIONS: "Has he been receiving any narcotic medications?" (e.g., morphine, Vicodin)     unsure 6. CAUSE: "What do you think is causing the confusion?"      Yes- it was 1 year ago 7. OTHER SYMPTOMS: "Are there any other symptoms?" (e.g., difficulty breathing, headache, fever, weakness)     no  Protocols used: CONFUSION - DELIRIUM-A-AH

## 2018-11-23 NOTE — Telephone Encounter (Addendum)
Received call from patient and he was only able to confirmed his name before the call was disconnected.  The agent that transferred the call stated that he had had trouble with his name and birthday. And he kept saying Dr. Doug Sou name wrong. Attempted to call him several times, could not be reached. Called his sister who gave me a new number for him. Attempted to call him, no answer.  911 called to check on him. Notified LB at [facility] of pt's call. Sister notified of 911 call. Verbal understanding.

## 2018-11-23 NOTE — Telephone Encounter (Signed)
Dr. [name] - Juluis Rainier, please advise.

## 2018-11-23 NOTE — Telephone Encounter (Signed)
Dr. [name] - Juluis Rainier

## 2018-11-26 NOTE — Telephone Encounter (Signed)
Pt is scheduled for a virtual visit with Dr [name] 11/27/2018 at 4 pm. Pt sister is aware

## 2018-11-26 NOTE — Telephone Encounter (Signed)
It is possible to arrange virtual visit with pt and his sister? Thanks, BJ

## 2018-11-27 ENCOUNTER — Telehealth (INDEPENDENT_AMBULATORY_CARE_PROVIDER_SITE_OTHER): Payer: Medicare Other | Admitting: Family Medicine

## 2018-11-27 ENCOUNTER — Encounter: Payer: Self-pay | Admitting: Family Medicine

## 2018-11-27 DIAGNOSIS — R4781 Slurred speech: Secondary | ICD-10-CM

## 2018-11-27 DIAGNOSIS — R519 Headache, unspecified: Secondary | ICD-10-CM

## 2018-11-27 DIAGNOSIS — I739 Peripheral vascular disease, unspecified: Secondary | ICD-10-CM | POA: Diagnosis not present

## 2018-11-27 DIAGNOSIS — Y92009 Unspecified place in unspecified non-institutional (private) residence as the place of occurrence of the external cause: Secondary | ICD-10-CM

## 2018-11-27 DIAGNOSIS — W19XXXA Unspecified fall, initial encounter: Secondary | ICD-10-CM | POA: Diagnosis not present

## 2018-11-27 DIAGNOSIS — G894 Chronic pain syndrome: Secondary | ICD-10-CM | POA: Diagnosis not present

## 2018-11-27 NOTE — Progress Notes (Signed)
Virtual Visit via Telephone Note  I connected with [name] [name] and his male friend on 11/27/18 at  4:00 PM EDT by telephone and verified that I am speaking with the correct person using two identifiers.   I discussed the limitations, risks, security and privacy concerns of performing an evaluation and management service by telephone and the availability of in person appointments. I also discussed with the patient that there may be a patient responsible charge related to this service. The patient expressed understanding and agreed to proceed.  Location patient: home Location provider: work office Participants present for the call: patient, provider Patient did not have a visit in the prior 7 days to address this/these issue(s).  History of Present Illness: [name] [name] is a 78 yo male with history of chronic pain, hypertension, and PAD who had an episode of slurred speech, retro-ocular headache, and confusion 3 days ago that lasted for a few hours. Apparently he refused to go to the ER. He reporting EMS evaluation at home and BP was "fine" as well as fingerstick.  Symptoms resolved within 24 hours. He describes headache as severe. No associated visual changes, nausea, vomiting, or focal deficit.  2 hours before episode he had a fall on pavement in his house, he denies head trauma. Elbow is edematous, range of motion is at his baseline, he denies deformities. No head trauma.  Hypertension: He has not been taking losartan 50 mg daily. He denies checking BP at home. He denies chest pain, dyspnea, palpitation, or worsening edema.  PAD: He is taking Aspirin 81 mg. He is not taking atorvastatin 10 mg and he is still smoking.  Chronic pain: He is taking oxycodone 10 mg twice daily. He is not taking duloxetine 60 mg, which were recommended to treat generalized OA, chronic back pain, and radiculopathy.  He denies side effects. Medication seems to be helping with  pain.   Observations/Objective: Patient sounds cheerful and well on the phone. I do not appreciate any SOB, cough,or stridor. Speech and thought processing are grossly intact. When asked about date:"11/20/2017." Oriented in place and self. Patient reported vitals:He does not have BP monitor.  Assessment and Plan:  1. Slurred speech Resolved. Possible etiology discussed,?  TIA. Clearly instructed about warning signs.  - [name] Brain Wo Contrast; Future  2. Headache, unspecified headache type Headache has resolved. Brain MRI will be arranged. Instructed about warning signs.  - [name] Brain Wo Contrast; Future  3. Fall in home, initial encounter Fall precautions discussed. Risk for falls is high due to history of unstable gait, generalized OA, and lumbar radiculopathy as well as medications. Continue using his walker. He has refused PT in the past.  He and his friend do not think elbow imaging is needed. Pain is improving and there are no changes in range of motion.  4. Chronic pain disorder He is tolerating oxycodone well. No changes in current management. We discussed some side effects. He needs appointment for pain management.  5. PAD (peripheral artery disease) (HCC)-Mild Continue Aspirin 81 mg for now. He is not taking atorvastatin. Encouraged smoking cessation.   Follow Up Instructions:  Return in about 2 weeks (around 12/11/2018) for In office for pain management and HTN.  I did not refer this patient for an OV in the next 24 hours for this/these issue(s).  I discussed the assessment and treatment plan with the patient. He and his friend were provided an opportunity to ask questions and all were answered. He agreed with  the plan and demonstrated an understanding of the instructions.    I provided 23 minutes of non-face-to-face time during this encounter.   Morrison Masser [name], MD

## 2018-11-28 ENCOUNTER — Other Ambulatory Visit: Payer: Self-pay

## 2018-11-28 NOTE — Patient Outreach (Signed)
[facility]) Care Management  11/28/2018  Delfino Semo December 15, 1940 [id number]   Medication Adherence call to Mr. [facility] HIPPA Compliant Voice message left with a call back number. Mr. Aseltine is showing past due on Atorvastatin 10 mg under [place].   [place] Management Direct Dial [phone]  Fax [phone] [email or web address]

## 2018-12-03 ENCOUNTER — Other Ambulatory Visit: Payer: Self-pay

## 2018-12-03 NOTE — Patient Outreach (Signed)
[facility]) Care Management  12/03/2018  Yony Pinyan January 08, 1941 [id number]   Medication Adherence call to Mr. Ronson Duchesneau HIPPA Compliant Voice message left with a call back number. Mr. Yagi is showing past due on Atorvastatin 10 mg and Losartan 50 mg under [place].   [place] Management Direct Dial [phone]  Fax [phone] [email or web address]

## 2018-12-04 ENCOUNTER — Ambulatory Visit (INDEPENDENT_AMBULATORY_CARE_PROVIDER_SITE_OTHER): Payer: Medicare Other | Admitting: Family Medicine

## 2018-12-04 ENCOUNTER — Other Ambulatory Visit: Payer: Self-pay

## 2018-12-04 ENCOUNTER — Encounter: Payer: Self-pay | Admitting: Family Medicine

## 2018-12-04 VITALS — BP 150/76 | HR 71 | Temp 97.0°F | Resp 16 | Ht 68.0 in | Wt 145.4 lb

## 2018-12-04 DIAGNOSIS — G894 Chronic pain syndrome: Secondary | ICD-10-CM | POA: Diagnosis not present

## 2018-12-04 DIAGNOSIS — M159 Polyosteoarthritis, unspecified: Secondary | ICD-10-CM | POA: Diagnosis not present

## 2018-12-04 DIAGNOSIS — I10 Essential (primary) hypertension: Secondary | ICD-10-CM | POA: Diagnosis not present

## 2018-12-04 DIAGNOSIS — M48062 Spinal stenosis, lumbar region with neurogenic claudication: Secondary | ICD-10-CM | POA: Diagnosis not present

## 2018-12-04 DIAGNOSIS — I739 Peripheral vascular disease, unspecified: Secondary | ICD-10-CM

## 2018-12-04 MED ORDER — DULOXETINE HCL 60 MG PO CPEP: 60.0000 mg | ORAL_CAPSULE | Freq: Every day | ORAL | 2 refills | Status: DC

## 2018-12-04 MED ORDER — OXYCODONE HCL 10 MG PO TABS: 10.0000 mg | ORAL_TABLET | Freq: Two times a day (BID) | ORAL | 0 refills | Status: DC | PRN

## 2018-12-04 MED ORDER — LOSARTAN POTASSIUM 50 MG PO TABS: 25.0000 mg | ORAL_TABLET | Freq: Every day | ORAL | 0 refills | Status: DC

## 2018-12-04 NOTE — Patient Instructions (Addendum)
A few things to remember from today's visit:   Generalized osteoarthritis of multiple sites - Plan: DULoxetine (CYMBALTA) 60 MG capsule, Oxycodone HCl 10 MG TABS  Chronic pain disorder - Plan: Oxycodone HCl 10 MG TABS  Spinal stenosis, lumbar region, with neurogenic claudication - Plan: Oxycodone HCl 10 MG TABS  Hypertension, essential, benign - Plan: losartan (COZAAR) 50 MG tablet  Please resume Atorvastatin, Cozaar, and Duloxetine. Cozaar (losartan) you can take 1/2 tab instead 1 tab.   Move slowly and use your cane or walker all the time. Some of medications you take increase risk for falls.  Please be sure medication list is accurate. If a new problem present, please set up appointment sooner than planned today.

## 2018-12-04 NOTE — Progress Notes (Signed)
ACUTE VISIT   HPI:  Chief Complaint  Patient presents with   Follow-up    [name] is a 78 y.o. male, who is here today complaining of "not feeling well." He has "good and bad days" , today is a "good day." He is referring to constant joint and back pain. C/O not having his Oxycodone regularly because he has not had a Rx sent to his pharmacy; so he is taking a tab daily if severe pain.  He has been on Oxycodone 10 mg for many years. Denies side effects. Hx of falls due to unstable gait, last fall 11-14 days ago while walking on his driveway, unharmed.  Late 11/2018 there was a concerned from his friend that he was having slurred speech and MS changes. He states that these problems have resolved.  Occipital headache "sometimes" and every now and then left retro ocular pain, the latter one started after left eye surgery (years ago). He feels like he can drive, so he does. He has not been involved in a MVA.  Chronic pain due to generalized OA, cervical and lumbar back pain. Lower extremities radicular pain. + Stiffness. Shoulder,swrists,,hips,knees,and ankles. No joint edema or erythema. He "cannot move around very well" due to pain.  Pain is about 10/10, constant. Oxycodone 10 mg bid helps,sometiems he needs an extra tab. Sometimes pain is milder.  He lives alone. He is not longer taking Cymbalta 60 mg,which was added a few months ago and reported been helping with arthralgias and back pain. No side effects reported.  HTN: He is not taking Losartan 50 mg daily. He is not checking BP at home. Denies visual changes, CP.SOB,palpitations,or edema. LE edema has improved, he is not taking Furosemide.  Lab Results  Component Value Date   CREATININE 1.24 07/17/2018   BUN 13 07/17/2018   NA 141 07/17/2018   K 4.7 07/17/2018   CL 103 07/17/2018   CO2 31 07/17/2018   PAD, he is not taking Atorvastatin. He is on Aspirin 81 mg daily. He has not  noted blood in stool or gross hematuria.   Review of Systems  Constitutional: Positive for fatigue. Negative for appetite change and fever.  HENT: Negative for nosebleeds and sore throat.   Eyes: Negative for discharge and redness.  Respiratory: Negative for cough and wheezing.   Gastrointestinal: Negative for abdominal pain, nausea and vomiting.  Endocrine: Negative for cold intolerance and heat intolerance.  Genitourinary: Negative for decreased urine volume, dysuria and hematuria.  Musculoskeletal: Positive for arthralgias, gait problem and neck pain.  Neurological: Negative for dizziness, facial asymmetry, speech difficulty and weakness.  Psychiatric/Behavioral: Negative for confusion. The patient is nervous/anxious.   Rest see pertinent positives and negatives per HPI.   Current Outpatient Medications on File Prior to Visit  Medication Sig Dispense Refill   atorvastatin (LIPITOR) 10 MG tablet Take 1 tablet (10 mg total) by mouth daily. 90 tablet 3   No current facility-administered medications on file prior to visit.      Past Medical History:  Diagnosis Date   Back pain    Renal disorder    kidney stones   Spinal stenosis, lumbar region, with neurogenic claudication 07/07/2014   No Known Allergies  Social History   Socioeconomic History   Marital status: Widowed    Spouse name: Not on file   Number of children: Not on file   Years of education: Not on file   Highest education level: Not on  file  Occupational History   Not on file  Social Needs   Financial resource strain: Not on file   Food insecurity    Worry: Not on file    Inability: Not on file   Transportation needs    Medical: Not on file    Non-medical: Not on file  Tobacco Use   Smoking status: Current Every Day Smoker    Packs/day: 0.75    Types: Cigarettes   Smokeless tobacco: Never Used  Substance and Sexual Activity   Alcohol use: No   Drug use: Never   Sexual activity: Not  on file  Lifestyle   Physical activity    Days per week: Not on file    Minutes per session: Not on file   Stress: Not on file  Relationships   Social connections    Talks on phone: Not on file    Gets together: Not on file    Attends religious service: Not on file    Active member of club or organization: Not on file    Attends meetings of clubs or organizations: Not on file    Relationship status: Not on file  Other Topics Concern   Not on file  Social History Narrative   Not on file    Vitals:   12/04/18 1444  BP: (!) 150/76  Pulse: 71  Resp: 16  Temp: (!) 97 F (36.1 C)  SpO2: 97%   Body mass index is 22.11 kg/m.   Physical Exam  Nursing note and vitals reviewed. Constitutional: He is oriented to person, place, and time. He appears well-developed and well-nourished. No distress.  HENT:  Head: Normocephalic and atraumatic.  Mouth/Throat: Oropharynx is clear and moist and mucous membranes are normal.  Eyes: Pupils are equal, round, and reactive to light. Conjunctivae are normal.  Cardiovascular: Normal rate and regular rhythm.  No murmur heard. Pulses:      Dorsalis pedis pulses are 2+ on the right side and 2+ on the left side.  Respiratory: Effort normal and breath sounds normal. No respiratory distress.  GI: Soft. He exhibits no mass. There is no abdominal tenderness.  Musculoskeletal:        General: No edema.     Right shoulder: He exhibits decreased range of motion and tenderness. He exhibits no bony tenderness.     Left shoulder: He exhibits decreased range of motion and tenderness. He exhibits no bony tenderness.     Cervical back: He exhibits decreased range of motion and tenderness.     Lumbar back: He exhibits decreased range of motion. He exhibits no tenderness.       Back:  Lymphadenopathy:    He has no cervical adenopathy.  Neurological: He is alert and oriented to person, place, and time. He has normal strength. No cranial nerve deficit.  Gait abnormal.  Gait assisted by a cane.  Skin: Skin is warm. No rash noted. No erythema.  Psychiatric: He has a normal mood and affect. Cognition and memory are normal.  Well groomed, good eye contact.   ASSESSMENT AND PLAN:  [name] was seen today for follow-up.  Diagnoses and all orders for this visit:  Spinal stenosis, lumbar region, with neurogenic claudication Cymbalta 60 mg to resume and Oxycodone 10 mg bid to continue. Side effects of medications discussed.  -     Oxycodone HCl 10 MG TABS; Take 1 tablet (10 mg total) by mouth 2 (two) times daily as needed.  Generalized osteoarthritis of multiple sites  Resume Duloxetine 60 mg daily. Oxycodone 10 mg bid. Tylenol 650 mg tid. Fall precautions discussed.  -     DULoxetine (CYMBALTA) 60 MG capsule; Take 1 capsule (60 mg total) by mouth daily. -     Oxycodone HCl 10 MG TABS; Take 1 tablet (10 mg total) by mouth 2 (two) times daily as needed.  Chronic pain disorder Med contract signed today. Side effects of opioids in general. Discussed current guidelines for chronic opioid use. F/U in 3 months.  -     Oxycodone HCl 10 MG TABS; Take 1 tablet (10 mg total) by mouth 2 (two) times daily as needed.  Hypertension, essential, benign BP not well controlled. Possible complications of elevated BP discussed. He has Losartan 50 mg at home,recommend taking 1/2 tab daily. Low salt diet recommended. Instructed about warning signs.  -     losartan (COZAAR) 50 MG tablet; Take 0.5 tablets (25 mg total) by mouth daily.  PAD (peripheral artery disease) (HCC)-Mild Side effects of aspirin discussed. He has Atorvastatin 10 mg refills at his pharmacy. Appropriate foot/LE skin care. Instructed about warning signs.   Return in about 3 months (around 03/06/2019).   Nida Manfredi G. [name], MD  [facility]. [place] office.

## 2018-12-30 ENCOUNTER — Other Ambulatory Visit: Payer: Medicare Other

## 2019-01-03 ENCOUNTER — Other Ambulatory Visit: Payer: Self-pay

## 2019-01-03 NOTE — Patient Outreach (Signed)
[facility]) Care Management  01/03/2019  [name] [name] [facility] 1940-02-09 [id number]   Medication Adherence call to [name] HIPPA Compliant Voice message left with a call back number. [name] is showing past due on Atorvastatin 10 mg under [place].   [place] Management Direct Dial [phone]  Fax [phone] [name]

## 2019-01-21 ENCOUNTER — Telehealth: Payer: Self-pay | Admitting: Family Medicine

## 2019-01-21 ENCOUNTER — Other Ambulatory Visit: Payer: Self-pay | Admitting: Family Medicine

## 2019-01-21 DIAGNOSIS — M48062 Spinal stenosis, lumbar region with neurogenic claudication: Secondary | ICD-10-CM

## 2019-01-21 DIAGNOSIS — M159 Polyosteoarthritis, unspecified: Secondary | ICD-10-CM

## 2019-01-21 DIAGNOSIS — G894 Chronic pain syndrome: Secondary | ICD-10-CM

## 2019-01-21 MED ORDER — OXYCODONE HCL 10 MG PO TABS: 10.0000 mg | ORAL_TABLET | Freq: Two times a day (BID) | ORAL | 0 refills | Status: DC | PRN

## 2019-01-21 NOTE — Telephone Encounter (Signed)
Routing to PCP for approval.

## 2019-01-21 NOTE — Telephone Encounter (Signed)
Pt's sister [name] called for a refill on pt's Oxycodone 10mg   [address] [employer] [address]

## 2019-01-21 NOTE — Telephone Encounter (Signed)
Rx sent. Thanks, BJ 

## 2019-02-11 DIAGNOSIS — S52021A Displaced fracture of olecranon process without intraarticular extension of right ulna, initial encounter for closed fracture: Secondary | ICD-10-CM | POA: Diagnosis not present

## 2019-02-11 DIAGNOSIS — M71121 Other infective bursitis, right elbow: Secondary | ICD-10-CM | POA: Diagnosis not present

## 2019-02-12 DIAGNOSIS — M7021 Olecranon bursitis, right elbow: Secondary | ICD-10-CM | POA: Diagnosis not present

## 2019-02-13 ENCOUNTER — Telehealth: Payer: Self-pay

## 2019-02-13 NOTE — Telephone Encounter (Signed)
I spoke with [facility]. Appt made for Friday at 2:30pm for in office.

## 2019-02-13 NOTE — Telephone Encounter (Signed)
Copied from [place] [id number]. Topic: Appointment Scheduling - Scheduling Inquiry for Clinic >> Feb 13, 2019  1:58 PM Scherrie Gerlach wrote: Reason for CRM: His sister [name] calling to make appt for the pt.  He has been falling a lot, and fractured his elbow a week ago. T needs appt Friday in the afternoon preferably. Please ok appt. Only appts left is virtual.

## 2019-02-14 DIAGNOSIS — M7021 Olecranon bursitis, right elbow: Secondary | ICD-10-CM | POA: Diagnosis not present

## 2019-02-15 ENCOUNTER — Ambulatory Visit (INDEPENDENT_AMBULATORY_CARE_PROVIDER_SITE_OTHER): Payer: Medicare HMO

## 2019-02-15 ENCOUNTER — Ambulatory Visit (INDEPENDENT_AMBULATORY_CARE_PROVIDER_SITE_OTHER): Payer: Medicare HMO | Admitting: Family Medicine

## 2019-02-15 ENCOUNTER — Encounter: Payer: Self-pay | Admitting: Family Medicine

## 2019-02-15 ENCOUNTER — Other Ambulatory Visit: Payer: Self-pay

## 2019-02-15 VITALS — BP 126/70 | HR 68 | Resp 20 | Ht 68.0 in

## 2019-02-15 DIAGNOSIS — R600 Localized edema: Secondary | ICD-10-CM | POA: Diagnosis not present

## 2019-02-15 DIAGNOSIS — G894 Chronic pain syndrome: Secondary | ICD-10-CM

## 2019-02-15 DIAGNOSIS — I739 Peripheral vascular disease, unspecified: Secondary | ICD-10-CM | POA: Diagnosis not present

## 2019-02-15 DIAGNOSIS — R05 Cough: Secondary | ICD-10-CM | POA: Diagnosis not present

## 2019-02-15 DIAGNOSIS — M159 Polyosteoarthritis, unspecified: Secondary | ICD-10-CM | POA: Diagnosis not present

## 2019-02-15 DIAGNOSIS — S59901D Unspecified injury of right elbow, subsequent encounter: Secondary | ICD-10-CM

## 2019-02-15 DIAGNOSIS — S59901A Unspecified injury of right elbow, initial encounter: Secondary | ICD-10-CM | POA: Diagnosis not present

## 2019-02-15 DIAGNOSIS — M48062 Spinal stenosis, lumbar region with neurogenic claudication: Secondary | ICD-10-CM | POA: Diagnosis not present

## 2019-02-15 DIAGNOSIS — R059 Cough, unspecified: Secondary | ICD-10-CM

## 2019-02-15 DIAGNOSIS — R296 Repeated falls: Secondary | ICD-10-CM | POA: Diagnosis not present

## 2019-02-15 DIAGNOSIS — I10 Essential (primary) hypertension: Secondary | ICD-10-CM | POA: Diagnosis not present

## 2019-02-15 DIAGNOSIS — I700 Atherosclerosis of aorta: Secondary | ICD-10-CM

## 2019-02-15 NOTE — Assessment & Plan Note (Signed)
He discontinued atorvastatin. Still smoking, he is not interested in smoking cessation. Continue Aspirin 81 mg daily.

## 2019-02-15 NOTE — Patient Instructions (Addendum)
A few things to remember from today's visit:   Spinal stenosis, lumbar region, with neurogenic claudication  PAD (peripheral artery disease) ([place]), Chronic  Hypertension, essential, benign  Osteoarthritis of multiple joints, unspecified osteoarthritis type  Frequent falls  Chronic pain disorder  Cough - Plan: DG Chest 2 View  Elbow injury, right, subsequent encounter - Plan: DG Elbow Complete Right   Fall Prevention in the Home, Adult Falls can cause injuries. They can happen to people of all ages. There are many things you can do to make your home safe and to help prevent falls. Ask for help when making these changes, if needed. What actions can I take to prevent falls? General Instructions  Use good lighting in all rooms. Replace any light bulbs that burn out.  Turn on the lights when you go into a dark area. Use night-lights.  Keep items that you use often in easy-to-reach places. Lower the shelves around your home if necessary.  Set up your furniture so you have a clear path. Avoid moving your furniture around.  Do not have throw rugs and other things on the floor that can make you trip.  Avoid walking on wet floors.  If any of your floors are uneven, fix them.  Add color or contrast paint or tape to clearly mark and help you see: ? Any grab bars or handrails. ? First and last steps of stairways. ? Where the edge of each step is.  If you use a stepladder: ? Make sure that it is fully opened. Do not climb a closed stepladder. ? Make sure that both sides of the stepladder are locked into place. ? Ask someone to hold the stepladder for you while you use it.  If there are any pets around you, be aware of where they are. What can I do in the bathroom?      Keep the floor dry. Clean up any water that spills onto the floor as soon as it happens.  Remove soap buildup in the tub or shower regularly.  Use non-skid mats or decals on the floor of the tub or  shower.  Attach bath mats securely with double-sided, non-slip rug tape.  If you need to sit down in the shower, use a plastic, non-slip stool.  Install grab bars by the toilet and in the tub and shower. Do not use towel bars as grab bars. What can I do in the bedroom?  Make sure that you have a light by your bed that is easy to reach.  Do not use any sheets or blankets that are too big for your bed. They should not hang down onto the floor.  Have a firm chair that has side arms. You can use this for support while you get dressed. What can I do in the kitchen?  Clean up any spills right away.  If you need to reach something above you, use a strong step stool that has a grab bar.  Keep electrical cords out of the way.  Do not use floor polish or wax that makes floors slippery. If you must use wax, use non-skid floor wax. What can I do with my stairs?  Do not leave any items on the stairs.  Make sure that you have a light switch at the top of the stairs and the bottom of the stairs. If you do not have them, ask someone to add them for you.  Make sure that there are handrails on both sides of the  stairs, and use them. Fix handrails that are broken or loose. Make sure that handrails are as long as the stairways.  Install non-slip stair treads on all stairs in your home.  Avoid having throw rugs at the top or bottom of the stairs. If you do have throw rugs, attach them to the floor with carpet tape.  Choose a carpet that does not hide the edge of the steps on the stairway.  Check any carpeting to make sure that it is firmly attached to the stairs. Fix any carpet that is loose or worn. What can I do on the outside of my home?  Use bright outdoor lighting.  Regularly fix the edges of walkways and driveways and fix any cracks.  Remove anything that might make you trip as you walk through a door, such as a raised step or threshold.  Trim any bushes or trees on the path to your  home.  Regularly check to see if handrails are loose or broken. Make sure that both sides of any steps have handrails.  Install guardrails along the edges of any raised decks and porches.  Clear walking paths of anything that might make someone trip, such as tools or rocks.  Have any leaves, snow, or ice cleared regularly.  Use sand or salt on walking paths during winter.  Clean up any spills in your garage right away. This includes grease or oil spills. What other actions can I take?  Wear shoes that: ? Have a low heel. Do not wear high heels. ? Have rubber bottoms. ? Are comfortable and fit you well. ? Are closed at the toe. Do not wear open-toe sandals.  Use tools that help you move around (mobility aids) if they are needed. These include: ? Canes. ? Walkers. ? Scooters. ? Crutches.  Review your medicines with your doctor. Some medicines can make you feel dizzy. This can increase your chance of falling. Ask your doctor what other things you can do to help prevent falls. Where to find more information  Centers for Disease Control and Prevention, STEADI: https://garcia.biz/  [employer] on Aging: BrainJudge.co.uk Contact a doctor if:  You are afraid of falling at home.  You feel weak, drowsy, or dizzy at home.  You fall at home. Summary  There are many simple things that you can do to make your home safe and to help prevent falls.  Ways to make your home safe include removing tripping hazards and installing grab bars in the bathroom.  Ask for help when making these changes in your home. This information is not intended to replace advice given to you by your health care provider. Make sure you discuss any questions you have with your health care provider. Document Revised: 05/10/2018 Document Reviewed: 09/01/2016 Elsevier Patient Education  [place].  Please be sure medication list is accurate. If a new problem present, please set up  appointment sooner than planned today.

## 2019-02-15 NOTE — Assessment & Plan Note (Addendum)
We discussed side effects of opioid medication and current recommendations in regard to chronic use for pain management. Med contract is current. Medication can increase the risk for falls. I do not feel comfortable increasing dose. Oxycodone last filled on 01/21/2019.

## 2019-02-15 NOTE — Progress Notes (Signed)
HPI:   [name] [name] is a 79 y.o. male, who is here today with his sister to follow on recent ER visit. At this time I do not have records of visit. He presented to the ER on 02/11/19.   A week ago he had a fall at home, he went to urgent care in [place].  According to patient he was diagnosed with a right elbow fracture, based on description it seems to be an avulsion fracture.  States that he is not supposed to follow with ortho. A wound on area of trauma was treated, apparently it was infected, followed ups x 3. He completed antibiotic treatment. Still has pain but improving. Negative for new numbness,tingling,or burning.  He just fell again in the parking lot when he was getting off of his truck. Hit occipital skull, denies headache,visual changes,or MS changes.  He has had several fall in the past year.  Chronic pain: Currently he is on oxycodone 10 mg daily, he is taking 2 tablets at the same time sometimes because pain is "unbearable." He is also on Cymbalta 60 mg daily.  Generalized OA,DDD,and spinal stenosis with neurogenic claudication. Unstable gait. He has a walker at home but it does not fit in his truck, so he uses his cane most of the time. Requesting a 4 leg cane.  Fall are when he tries to get off or get up in his truck. He has had HH PT and it has helped.  His son is now staying with him but he is alone at home most of the time.  HTN: He is taking Losartan 50 mg daily. Not checking BP at home. Denies severe/frequent headache, chest pain, dyspnea, palpitation,focal weakness, or worsening edema.  Lab Results  Component Value Date   CREATININE 1.24 07/17/2018   BUN 13 07/17/2018   NA 141 07/17/2018   K 4.7 07/17/2018   CL 103 07/17/2018   CO2 31 07/17/2018   PAD, he is not taking Atorvastatin as instructed. He is on Aspirin 81 mg daily. He has not noted ulcers on LE's or cyanosis. No cold extremities.  + Tobacco use. His sister  reports cough. He states that a couple weeks ago he had non productive cough, worse in the morning. No hemoptysis,SOB,or wheezing. He denies fever,chills,sore throat,changes in smell or taste. No sick contact.  Review of Systems  Constitutional: Positive for fatigue. Negative for activity change and appetite change.  HENT: Negative for nosebleeds.   Gastrointestinal: Negative for abdominal pain, nausea and vomiting.  Genitourinary: Negative for decreased urine volume, dysuria and hematuria.  Musculoskeletal: Positive for arthralgias, back pain, gait problem and neck pain.  Neurological: Negative for dizziness, syncope and facial asymmetry.  Psychiatric/Behavioral: Negative for confusion.  Rest see pertinent positives and negatives per HPI.  Current Outpatient Medications on File Prior to Visit  Medication Sig Dispense Refill  . DULoxetine (CYMBALTA) 60 MG capsule Take 1 capsule (60 mg total) by mouth daily. 90 capsule 2  . losartan (COZAAR) 50 MG tablet Take 0.5 tablets (25 mg total) by mouth daily. 45 tablet 0  . Oxycodone HCl 10 MG TABS Take 1 tablet (10 mg total) by mouth 2 (two) times daily as needed. 60 tablet 0   No current facility-administered medications on file prior to visit.     Past Medical History:  Diagnosis Date  . Back pain   . Renal disorder    kidney stones  . Spinal stenosis, lumbar region, with neurogenic claudication  07/07/2014   No Known Allergies  Social History   Socioeconomic History  . Marital status: Widowed    Spouse name: Not on file  . Number of children: Not on file  . Years of education: Not on file  . Highest education level: Not on file  Occupational History  . Not on file  Tobacco Use  . Smoking status: Current Every Day Smoker    Packs/day: 0.75    Types: Cigarettes  . Smokeless tobacco: Never Used  Substance and Sexual Activity  . Alcohol use: No  . Drug use: Never  . Sexual activity: Not on file  Other Topics Concern  . Not  on file  Social History Narrative  . Not on file   Social Determinants of Health   Financial Resource Strain:   . Difficulty of Paying Living Expenses: Not on file  Food Insecurity:   . Worried About Charity fundraiser in the Last Year: Not on file  . Ran Out of Food in the Last Year: Not on file  Transportation Needs:   . Lack of Transportation (Medical): Not on file  . Lack of Transportation (Non-Medical): Not on file  Physical Activity:   . Days of Exercise per Week: Not on file  . Minutes of Exercise per Session: Not on file  Stress:   . Feeling of Stress : Not on file  Social Connections:   . Frequency of Communication with Friends and Family: Not on file  . Frequency of Social Gatherings with Friends and Family: Not on file  . Attends Religious Services: Not on file  . Active Member of Clubs or Organizations: Not on file  . Attends Archivist Meetings: Not on file  . Marital Status: Not on file    Vitals:   02/15/19 1508  BP: 126/70  Pulse: 68  Resp: 20  SpO2: 95%   Body mass index is 22.11 kg/m.   Physical Exam  Nursing note and vitals reviewed. Constitutional: He is oriented to person, place, and time. He appears well-developed. No distress.  HENT:  Head: Normocephalic and atraumatic.  Mouth/Throat: Oropharynx is clear and moist and mucous membranes are normal. Abnormal dentition.  Eyes: Conjunctivae are normal.  Cardiovascular: Normal rate and regular rhythm.  No murmur heard. PT pulses present bilateral.  Respiratory: Effort normal and breath sounds normal. No respiratory distress.  GI: Soft. There is no abdominal tenderness.  Musculoskeletal:        General: Edema (pitting trace periankle edema,bilateral) present.     Right shoulder: Tenderness present. Decreased range of motion.     Left shoulder: Tenderness present. Decreased range of motion.     Right elbow: Swelling present. No deformity. Decreased range of motion.  Lymphadenopathy:     He has no cervical adenopathy.  Neurological: He is alert and oriented to person, place, and time. He has normal strength.  He is in a wheel chair. Unstable gait.  Skin: Skin is warm. No rash noted. No erythema.  Psychiatric: He has a normal mood and affect. Cognition and memory are normal.  Well groomed, good eye contact.    ASSESSMENT AND PLAN:  [name] [name] was seen today for frequent falls.  Orders Placed This Encounter  Procedures  . DG Chest 2 View  . DG Elbow Complete Right  . Ambulatory referral to Home Health   Elbow injury, right, subsequent encounter Reporting improvement of pain. ROM exercises for now, PT will be arranged. We will try to obtain  records of ER visit. Today elbow X ray will be obtained.  Spinal stenosis, lumbar region, with neurogenic claudication Continue Cymbalta 60 mg daily and Oxycodone 10 mg bid. Fall precautions. He does not want a walker.  -     Ambulatory referral to [place] of multiple joints, unspecified osteoarthritis type Continue Cymbalta 60 mg daily and Oxycodone. Side effects discussed. Also Tylenol 500 mg 3-4 times per day.  Frequent falls Several risk factors for falls: Medical conditions,Hx of falls,and medications. I am concerned about him being along most of the day. I do not think head imaging is needed today but instructed sister to monitor for MS changes or severe headache.  Cough ? COPD. Lung auscultation negative. Adverse effects of tobacco used have been discussed. He has tried to quit unsuccessfully in the past. Instructed about warning signs. Further recommendations according to CXR results.   PAD (peripheral artery disease) (HCC)-Mild He discontinued atorvastatin. Still smoking, he is not interested in smoking cessation. Continue Aspirin 81 mg daily.   Chronic pain disorder We discussed side effects of opioid medication and current recommendations in regard to chronic use for pain  management. Med contract is current. Medication can increase the risk for falls. I do not feel comfortable increasing dose. Oxycodone last filled on 01/21/2019.  Hypertension, essential, benign Problem is better controlled. Continue losartan 50 mg daily. Low-salt diet recommended.   Return in about 3 months (around 05/16/2019).     Therron Sells G. [name], MD  [facility]. [place] office.

## 2019-02-15 NOTE — Assessment & Plan Note (Signed)
Problem is better controlled. Continue losartan 50 mg daily. Low-salt diet recommended.

## 2019-02-16 DIAGNOSIS — I700 Atherosclerosis of aorta: Secondary | ICD-10-CM | POA: Insufficient documentation

## 2019-02-21 ENCOUNTER — Telehealth: Payer: Self-pay | Admitting: Family Medicine

## 2019-02-21 NOTE — Telephone Encounter (Signed)
Pt called in and stated that he received a call about his xray results and would like to have a call back.

## 2019-02-22 NOTE — Telephone Encounter (Signed)
I left pt another voicemail to return my call.

## 2019-02-22 NOTE — Telephone Encounter (Signed)
I have left pt a voicemail x3 times to call the office back to go over his x-ray results.

## 2019-02-25 NOTE — Telephone Encounter (Signed)
I left pt another voicemail to return my call.

## 2019-02-26 DIAGNOSIS — I739 Peripheral vascular disease, unspecified: Secondary | ICD-10-CM | POA: Diagnosis not present

## 2019-02-26 DIAGNOSIS — R296 Repeated falls: Secondary | ICD-10-CM | POA: Diagnosis not present

## 2019-02-26 DIAGNOSIS — I10 Essential (primary) hypertension: Secondary | ICD-10-CM | POA: Diagnosis not present

## 2019-02-26 DIAGNOSIS — M48062 Spinal stenosis, lumbar region with neurogenic claudication: Secondary | ICD-10-CM | POA: Diagnosis not present

## 2019-02-26 DIAGNOSIS — R2681 Unsteadiness on feet: Secondary | ICD-10-CM | POA: Diagnosis not present

## 2019-02-26 DIAGNOSIS — S42444D Nondisplaced fracture (avulsion) of medial epicondyle of right humerus, subsequent encounter for fracture with routine healing: Secondary | ICD-10-CM | POA: Diagnosis not present

## 2019-02-26 DIAGNOSIS — M150 Primary generalized (osteo)arthritis: Secondary | ICD-10-CM | POA: Diagnosis not present

## 2019-02-27 NOTE — Telephone Encounter (Signed)
Patient has not returned call, will go ahead and mail letter out with results.

## 2019-03-04 ENCOUNTER — Telehealth: Payer: Self-pay | Admitting: *Deleted

## 2019-03-04 ENCOUNTER — Ambulatory Visit: Payer: Medicare HMO | Admitting: Family Medicine

## 2019-03-04 DIAGNOSIS — I10 Essential (primary) hypertension: Secondary | ICD-10-CM | POA: Diagnosis not present

## 2019-03-04 DIAGNOSIS — S42444D Nondisplaced fracture (avulsion) of medial epicondyle of right humerus, subsequent encounter for fracture with routine healing: Secondary | ICD-10-CM | POA: Diagnosis not present

## 2019-03-04 DIAGNOSIS — I739 Peripheral vascular disease, unspecified: Secondary | ICD-10-CM | POA: Diagnosis not present

## 2019-03-04 DIAGNOSIS — R296 Repeated falls: Secondary | ICD-10-CM | POA: Diagnosis not present

## 2019-03-04 DIAGNOSIS — M150 Primary generalized (osteo)arthritis: Secondary | ICD-10-CM | POA: Diagnosis not present

## 2019-03-04 DIAGNOSIS — M48062 Spinal stenosis, lumbar region with neurogenic claudication: Secondary | ICD-10-CM | POA: Diagnosis not present

## 2019-03-04 DIAGNOSIS — R2681 Unsteadiness on feet: Secondary | ICD-10-CM | POA: Diagnosis not present

## 2019-03-04 NOTE — Telephone Encounter (Signed)
Patient was scheduled for a 9:30am appt and has not arrived to the office.  Per Dr [name], I called the pt and left a message to check on him and to see if he would like to have a virtual visit.

## 2019-03-05 ENCOUNTER — Telehealth: Payer: Self-pay | Admitting: Family Medicine

## 2019-03-05 ENCOUNTER — Encounter: Payer: Self-pay | Admitting: Family Medicine

## 2019-03-05 ENCOUNTER — Encounter (INDEPENDENT_AMBULATORY_CARE_PROVIDER_SITE_OTHER): Payer: Medicare HMO | Admitting: Family Medicine

## 2019-03-05 NOTE — Telephone Encounter (Signed)
Telephone visit: No show. Called x 2 today and left messages. Ja Pistole [name], MD

## 2019-03-05 NOTE — Telephone Encounter (Signed)
FYI - he has a phone visit with you at 2:00 today, he missed his OV yesterday.

## 2019-03-05 NOTE — Telephone Encounter (Signed)
Pt would like for Dr. [name] to know that he is seeing a physical therapist now.  Pt can be reached at [phone] if needed

## 2019-03-05 NOTE — Progress Notes (Signed)
Called x 2, no answer, voice mail full. Garek Schuneman [name], MD

## 2019-03-06 ENCOUNTER — Telehealth (INDEPENDENT_AMBULATORY_CARE_PROVIDER_SITE_OTHER): Payer: Medicare HMO | Admitting: Family Medicine

## 2019-03-06 ENCOUNTER — Encounter: Payer: Self-pay | Admitting: Family Medicine

## 2019-03-06 ENCOUNTER — Telehealth: Payer: Self-pay | Admitting: Family Medicine

## 2019-03-06 VITALS — BP 122/90 | Ht 68.0 in

## 2019-03-06 DIAGNOSIS — S42444D Nondisplaced fracture (avulsion) of medial epicondyle of right humerus, subsequent encounter for fracture with routine healing: Secondary | ICD-10-CM | POA: Diagnosis not present

## 2019-03-06 DIAGNOSIS — R296 Repeated falls: Secondary | ICD-10-CM | POA: Diagnosis not present

## 2019-03-06 DIAGNOSIS — M150 Primary generalized (osteo)arthritis: Secondary | ICD-10-CM | POA: Diagnosis not present

## 2019-03-06 DIAGNOSIS — M159 Polyosteoarthritis, unspecified: Secondary | ICD-10-CM

## 2019-03-06 DIAGNOSIS — M48062 Spinal stenosis, lumbar region with neurogenic claudication: Secondary | ICD-10-CM | POA: Diagnosis not present

## 2019-03-06 DIAGNOSIS — I10 Essential (primary) hypertension: Secondary | ICD-10-CM | POA: Diagnosis not present

## 2019-03-06 DIAGNOSIS — S42401D Unspecified fracture of lower end of right humerus, subsequent encounter for fracture with routine healing: Secondary | ICD-10-CM | POA: Diagnosis not present

## 2019-03-06 DIAGNOSIS — I739 Peripheral vascular disease, unspecified: Secondary | ICD-10-CM | POA: Diagnosis not present

## 2019-03-06 DIAGNOSIS — R2681 Unsteadiness on feet: Secondary | ICD-10-CM | POA: Diagnosis not present

## 2019-03-06 MED ORDER — LOSARTAN POTASSIUM 50 MG PO TABS: 50.0000 mg | ORAL_TABLET | Freq: Every day | ORAL | 2 refills | Status: DC

## 2019-03-06 MED ORDER — DULOXETINE HCL 60 MG PO CPEP: 60.0000 mg | ORAL_CAPSULE | Freq: Every day | ORAL | 2 refills | Status: DC

## 2019-03-06 NOTE — Patient Instructions (Signed)
TELEPHONE VISIT [phone] elbow&neck pain/fell again today///bos

## 2019-03-06 NOTE — Telephone Encounter (Signed)
fyi

## 2019-03-06 NOTE — Progress Notes (Signed)
Virtual Visit via Telephone Note  I connected with [name] [name] on 03/06/19 at  3:30 PM EST by telephone and verified that I am speaking with the correct person using two identifiers.   I discussed the limitations, risks, security and privacy concerns of performing an evaluation and management service by telephone and the availability of in person appointments. I also discussed with the patient that there may be a patient responsible charge related to this service. The patient expressed understanding and agreed to proceed.  Location patient: home Location provider: work office Participants present for the call: patient, provider Patient did not have a visit in the prior 7 days to address this/these issue(s).   History of Present Illness: [name] [name] is a 79 year old male with history of chronic pain, lumbar stenosis, cervical degenerative disc disease, tobacco use disorder, and unstable gait following on his last visit. He was last seen here in the office on 02/15/2019. Last visit he was complaining about right elbow fracture, reported improvement Still right elbow edema, he "can move it" but it hurts. He has not noted erythema. Negative for fever or chills.  Right elbow X ray on 02/15/19: 1. Fragmented olecranon, lucency through the base of the olecranon, may represent fracture, however age indeterminate. 2. Elbow osteoarthritis and enthesopathic change about the lateral humeral epicondyle. 3. Soft tissue edema about the olecranon bursa.   Frequent falls: PT already started, x 2 so far. He had another fall this morning , states that he turned around "too fast"  He felt backwards and hit occipital aspect of head. Denies LOC or confusion. He got up with no help.  He falls at least 1-2 daily. He states that he has fallen several times since his last visit. Brain MRI ordered in 11/2018 was not done.   Denies headache, visual changes, nausea, vomiting, or focal neurologic  deficit. He is now using his walker.  HTN: He is on Losartan 50 mg daily. BP has been checked before and after PT.  Chronic pain: Generalized OA,lumbar stenosis,and cervical pain. He is on Cymbalta 60 mg daily and Oxycodone 10 mg bid. Tolerating medication well.  Observations/Objective: Patient sounds cheerful and well on the phone. I do not appreciate any SOB. Speech and thought processing are grossly intact. Patient reported vitals:BP 122/90   Ht 5\' 8"  (1.727 m)   BMI 22.11 kg/m   Assessment and Plan:  1. Closed fracture of right elbow with routine healing, subsequent encounter Seems to be healing appropriate but because reporting edema and still pain,ortho referral placed.  - Ambulatory referral to Orthopedic Surgery  2. Spinal stenosis, lumbar region, with neurogenic claudication We discussed side effects of chronic opioid for pain management and Cymbalta. No changes in current management.  3. Hypertension, essential, benign Better controlled. Continue Losartan 50 mg daily. Low salt diet.  - losartan (COZAAR) 50 MG tablet; Take 1 tablet (50 mg total) by mouth daily.  Dispense: 90 tablet; Refill: 2  4. Frequent falls Continue PT. He may benefit from motorized wheel chair but he is not interested for now. Fall precautions discussed. Monitor for headache or other neurologic change.  5. Generalized osteoarthritis of multiple sites We discussed dx,prognosis,and treatment options. Continue Cymbalta 60 mg daily and Oxycodone 10 mg bid.  - DULoxetine (CYMBALTA) 60 MG capsule; Take 1 capsule (60 mg total) by mouth daily.  Dispense: 90 capsule; Refill: 2   Follow Up Instructions:  Return in about 2 months (around 05/04/2019) for HTN,OA,fall.  I did not refer  this patient for an OV in the next 24 hours for this/these issue(s).  I discussed the assessment and treatment plan with the patient. The patient was provided an opportunity to ask questions and all were answered.  The patient agreed with the plan and demonstrated an understanding of the instructions.   I provided 25 minutes of non-face-to-face time during this encounter.   Ayame Rena [name], MD

## 2019-03-06 NOTE — Telephone Encounter (Signed)
[name] [name] from [facility] wanted to inform Dr. [name] that pt had a fall this morning in his home and hurt his right elbow and neck but seems to be ok.

## 2019-03-08 ENCOUNTER — Ambulatory Visit: Payer: Medicare HMO | Admitting: Family Medicine

## 2019-03-11 ENCOUNTER — Telehealth: Payer: Self-pay | Admitting: Family Medicine

## 2019-03-11 DIAGNOSIS — I10 Essential (primary) hypertension: Secondary | ICD-10-CM | POA: Diagnosis not present

## 2019-03-11 DIAGNOSIS — M48062 Spinal stenosis, lumbar region with neurogenic claudication: Secondary | ICD-10-CM | POA: Diagnosis not present

## 2019-03-11 DIAGNOSIS — R296 Repeated falls: Secondary | ICD-10-CM | POA: Diagnosis not present

## 2019-03-11 DIAGNOSIS — I739 Peripheral vascular disease, unspecified: Secondary | ICD-10-CM | POA: Diagnosis not present

## 2019-03-11 DIAGNOSIS — S42444D Nondisplaced fracture (avulsion) of medial epicondyle of right humerus, subsequent encounter for fracture with routine healing: Secondary | ICD-10-CM | POA: Diagnosis not present

## 2019-03-11 DIAGNOSIS — M150 Primary generalized (osteo)arthritis: Secondary | ICD-10-CM | POA: Diagnosis not present

## 2019-03-11 DIAGNOSIS — R2681 Unsteadiness on feet: Secondary | ICD-10-CM | POA: Diagnosis not present

## 2019-03-11 NOTE — Telephone Encounter (Signed)
Mark from [facility] wanted to let Dr.Jordan know that pt had a fall on Saturday and hit his head but is ok.

## 2019-03-11 NOTE — Telephone Encounter (Signed)
FYI

## 2019-03-14 ENCOUNTER — Ambulatory Visit: Payer: Medicare HMO | Admitting: Orthopaedic Surgery

## 2019-03-15 DIAGNOSIS — I10 Essential (primary) hypertension: Secondary | ICD-10-CM | POA: Diagnosis not present

## 2019-03-15 DIAGNOSIS — M150 Primary generalized (osteo)arthritis: Secondary | ICD-10-CM | POA: Diagnosis not present

## 2019-03-15 DIAGNOSIS — I739 Peripheral vascular disease, unspecified: Secondary | ICD-10-CM | POA: Diagnosis not present

## 2019-03-15 DIAGNOSIS — R296 Repeated falls: Secondary | ICD-10-CM | POA: Diagnosis not present

## 2019-03-15 DIAGNOSIS — R2681 Unsteadiness on feet: Secondary | ICD-10-CM | POA: Diagnosis not present

## 2019-03-15 DIAGNOSIS — S42444D Nondisplaced fracture (avulsion) of medial epicondyle of right humerus, subsequent encounter for fracture with routine healing: Secondary | ICD-10-CM | POA: Diagnosis not present

## 2019-03-15 DIAGNOSIS — M48062 Spinal stenosis, lumbar region with neurogenic claudication: Secondary | ICD-10-CM | POA: Diagnosis not present

## 2019-03-18 ENCOUNTER — Telehealth: Payer: Self-pay | Admitting: Family Medicine

## 2019-03-18 DIAGNOSIS — R2681 Unsteadiness on feet: Secondary | ICD-10-CM | POA: Diagnosis not present

## 2019-03-18 DIAGNOSIS — S42444D Nondisplaced fracture (avulsion) of medial epicondyle of right humerus, subsequent encounter for fracture with routine healing: Secondary | ICD-10-CM | POA: Diagnosis not present

## 2019-03-18 DIAGNOSIS — I10 Essential (primary) hypertension: Secondary | ICD-10-CM | POA: Diagnosis not present

## 2019-03-18 DIAGNOSIS — M48062 Spinal stenosis, lumbar region with neurogenic claudication: Secondary | ICD-10-CM | POA: Diagnosis not present

## 2019-03-18 DIAGNOSIS — M150 Primary generalized (osteo)arthritis: Secondary | ICD-10-CM | POA: Diagnosis not present

## 2019-03-18 DIAGNOSIS — R296 Repeated falls: Secondary | ICD-10-CM | POA: Diagnosis not present

## 2019-03-18 DIAGNOSIS — I739 Peripheral vascular disease, unspecified: Secondary | ICD-10-CM | POA: Diagnosis not present

## 2019-03-18 NOTE — Telephone Encounter (Signed)
Pt sister [name] called because pt has fallen 3 times since the last time Dr. [name] saw him. He fell at the store where he hurt his right elbow and has a bump on his head. Pt is also complaining for back and neck pain.   Patient Sister Phone:[phone]

## 2019-03-18 NOTE — Telephone Encounter (Signed)
[name] [name] from [facility] is concerned about the pt falling a lot. He believes he may have a UTI and wants to rule out his balance issues. He stated that he usually has good balance but has been falling more than normal.  [name] [name] is wondering if orders for a nurse can be put in to test for the UTI or if an appt with [name] would be more beneficial.  [name] [name] can be reached at [phone]  Pt can be reached at [phone]

## 2019-03-18 NOTE — Telephone Encounter (Signed)
Tried contacting pt's sister x 2 times, line is busy.

## 2019-03-18 NOTE — Telephone Encounter (Signed)
If back and neck pain are more than his normal pain, he needs to go to the ER for imaging. Also monitor for MS changes. He has history of frequent falls. We can consider wheel chair and/or moving to independent or assisted living facility , where he can be closely monitored. Thanks, BJ

## 2019-03-18 NOTE — Telephone Encounter (Signed)
Please advise 

## 2019-03-19 NOTE — Telephone Encounter (Signed)
Tried contacting pt's sister - line is busy.

## 2019-03-19 NOTE — Telephone Encounter (Signed)
He usually has unstable gait and hx of falls. Urine sample can be collected for UA and reflex Ucx. Thanks, BJ

## 2019-03-19 NOTE — Telephone Encounter (Signed)
Tried calling pt's sister again - line is busy.

## 2019-03-19 NOTE — Telephone Encounter (Signed)
I called and spoke with [name] [name]. Orders placed & faxed over to [facility] for UA and culture.

## 2019-03-20 ENCOUNTER — Ambulatory Visit: Payer: Medicare HMO | Admitting: Orthopaedic Surgery

## 2019-03-20 NOTE — Telephone Encounter (Signed)
I have tried contacting pt's sister 5 times now, and the call will not go through. Pt does have a follow up appointment scheduled with ortho this afternoon.

## 2019-03-25 DIAGNOSIS — R2681 Unsteadiness on feet: Secondary | ICD-10-CM | POA: Diagnosis not present

## 2019-03-25 DIAGNOSIS — M150 Primary generalized (osteo)arthritis: Secondary | ICD-10-CM | POA: Diagnosis not present

## 2019-03-25 DIAGNOSIS — I10 Essential (primary) hypertension: Secondary | ICD-10-CM | POA: Diagnosis not present

## 2019-03-25 DIAGNOSIS — R296 Repeated falls: Secondary | ICD-10-CM | POA: Diagnosis not present

## 2019-03-25 DIAGNOSIS — I739 Peripheral vascular disease, unspecified: Secondary | ICD-10-CM | POA: Diagnosis not present

## 2019-03-25 DIAGNOSIS — M48062 Spinal stenosis, lumbar region with neurogenic claudication: Secondary | ICD-10-CM | POA: Diagnosis not present

## 2019-03-25 DIAGNOSIS — S42444D Nondisplaced fracture (avulsion) of medial epicondyle of right humerus, subsequent encounter for fracture with routine healing: Secondary | ICD-10-CM | POA: Diagnosis not present

## 2019-03-26 ENCOUNTER — Telehealth: Payer: Self-pay | Admitting: Family Medicine

## 2019-03-26 DIAGNOSIS — I10 Essential (primary) hypertension: Secondary | ICD-10-CM

## 2019-03-26 MED ORDER — LOSARTAN POTASSIUM 50 MG PO TABS: 50.0000 mg | ORAL_TABLET | Freq: Every day | ORAL | 2 refills | Status: DC

## 2019-03-26 NOTE — Addendum Note (Signed)
Addended by: Rodrigo Ran on: 03/26/2019 04:55 PM   Modules accepted: Orders

## 2019-03-26 NOTE — Telephone Encounter (Signed)
Pt's sister Denice Bors is calling to verify medications for Jarold. She has spoke to the Pharmacy and they have fluoxetine and would like to know how many mg and times he needs to take it? As well as the other medications he has.  [name] can be reached at [phone]

## 2019-03-26 NOTE — Telephone Encounter (Signed)
See other phone note, spoke with sister.

## 2019-03-26 NOTE — Telephone Encounter (Signed)
I called and spoke with [facility]. We went over [name] [name]'s medications & new rx sent in for pt's losartan.

## 2019-03-27 DIAGNOSIS — I739 Peripheral vascular disease, unspecified: Secondary | ICD-10-CM | POA: Diagnosis not present

## 2019-03-27 DIAGNOSIS — M150 Primary generalized (osteo)arthritis: Secondary | ICD-10-CM | POA: Diagnosis not present

## 2019-03-27 DIAGNOSIS — M48062 Spinal stenosis, lumbar region with neurogenic claudication: Secondary | ICD-10-CM | POA: Diagnosis not present

## 2019-03-27 DIAGNOSIS — I10 Essential (primary) hypertension: Secondary | ICD-10-CM | POA: Diagnosis not present

## 2019-03-27 DIAGNOSIS — R2681 Unsteadiness on feet: Secondary | ICD-10-CM | POA: Diagnosis not present

## 2019-03-27 DIAGNOSIS — R350 Frequency of micturition: Secondary | ICD-10-CM | POA: Diagnosis not present

## 2019-03-27 DIAGNOSIS — N401 Enlarged prostate with lower urinary tract symptoms: Secondary | ICD-10-CM | POA: Diagnosis not present

## 2019-03-27 DIAGNOSIS — R296 Repeated falls: Secondary | ICD-10-CM | POA: Diagnosis not present

## 2019-03-27 DIAGNOSIS — S42444D Nondisplaced fracture (avulsion) of medial epicondyle of right humerus, subsequent encounter for fracture with routine healing: Secondary | ICD-10-CM | POA: Diagnosis not present

## 2019-03-28 ENCOUNTER — Telehealth: Payer: Self-pay | Admitting: Family Medicine

## 2019-03-28 DIAGNOSIS — M48062 Spinal stenosis, lumbar region with neurogenic claudication: Secondary | ICD-10-CM | POA: Diagnosis not present

## 2019-03-28 DIAGNOSIS — S42444D Nondisplaced fracture (avulsion) of medial epicondyle of right humerus, subsequent encounter for fracture with routine healing: Secondary | ICD-10-CM | POA: Diagnosis not present

## 2019-03-28 DIAGNOSIS — R296 Repeated falls: Secondary | ICD-10-CM | POA: Diagnosis not present

## 2019-03-28 DIAGNOSIS — I739 Peripheral vascular disease, unspecified: Secondary | ICD-10-CM | POA: Diagnosis not present

## 2019-03-28 DIAGNOSIS — I10 Essential (primary) hypertension: Secondary | ICD-10-CM | POA: Diagnosis not present

## 2019-03-28 DIAGNOSIS — R2681 Unsteadiness on feet: Secondary | ICD-10-CM | POA: Diagnosis not present

## 2019-03-28 DIAGNOSIS — M150 Primary generalized (osteo)arthritis: Secondary | ICD-10-CM | POA: Diagnosis not present

## 2019-03-28 NOTE — Telephone Encounter (Signed)
Almira PT with [facility] would like to have verbal orders to continue PT starting next week 2 weeks 3.  Almira stated that you could leave a msg is she does not answer the number is a secure voice mail.

## 2019-03-29 NOTE — Telephone Encounter (Signed)
Verbal given 

## 2019-03-29 NOTE — Telephone Encounter (Signed)
It is okay to give verbal authorization for requested services. Thanks, BJ 

## 2019-03-29 NOTE — Telephone Encounter (Signed)
Okay for verbal 

## 2019-04-05 ENCOUNTER — Telehealth: Payer: Self-pay | Admitting: Family Medicine

## 2019-04-05 DIAGNOSIS — S42444D Nondisplaced fracture (avulsion) of medial epicondyle of right humerus, subsequent encounter for fracture with routine healing: Secondary | ICD-10-CM | POA: Diagnosis not present

## 2019-04-05 DIAGNOSIS — I10 Essential (primary) hypertension: Secondary | ICD-10-CM | POA: Diagnosis not present

## 2019-04-05 DIAGNOSIS — R54 Age-related physical debility: Secondary | ICD-10-CM | POA: Diagnosis not present

## 2019-04-05 DIAGNOSIS — I739 Peripheral vascular disease, unspecified: Secondary | ICD-10-CM | POA: Diagnosis not present

## 2019-04-05 DIAGNOSIS — M48062 Spinal stenosis, lumbar region with neurogenic claudication: Secondary | ICD-10-CM | POA: Diagnosis not present

## 2019-04-05 DIAGNOSIS — R296 Repeated falls: Secondary | ICD-10-CM | POA: Diagnosis not present

## 2019-04-05 DIAGNOSIS — M150 Primary generalized (osteo)arthritis: Secondary | ICD-10-CM | POA: Diagnosis not present

## 2019-04-05 DIAGNOSIS — R2681 Unsteadiness on feet: Secondary | ICD-10-CM | POA: Diagnosis not present

## 2019-04-05 NOTE — Telephone Encounter (Signed)
For now I do not want to change antihypertensive medications. Continue monitoring BP regularly and if DBP is persistently elevated, we can adjust treatment. Thanks, BJ

## 2019-04-05 NOTE — Telephone Encounter (Signed)
Mark (nurse) from [facility] wanted to report that pt blood pressure is 140/100 and has had a few falls. Pt said his right knee is giving away.

## 2019-04-05 NOTE — Telephone Encounter (Signed)
I left [name] [name] a voicemail to return my call.

## 2019-04-05 NOTE — Telephone Encounter (Signed)
Mark returned my call. We went over the information below & he is aware pt has an appointment with [place] on 3/17.

## 2019-04-09 ENCOUNTER — Telehealth: Payer: Self-pay | Admitting: Family Medicine

## 2019-04-09 NOTE — Telephone Encounter (Signed)
I called and spoke with Elta Guadeloupe. He is concerned about all the falls patient has had last week. He tried to go do his visit yesterday, but was unsuccessful. He is going to go back today to try and do the PT. I advised we do have him scheduled for an appointment on the 17th. Patient does have a walker, but Elta Guadeloupe is unsure how much he is actually using it.

## 2019-04-09 NOTE — Telephone Encounter (Signed)
Mark from [facility]  would like to speak with regarding the patient [name] [name]; no other information is given; please call [phone]

## 2019-04-09 NOTE — Telephone Encounter (Signed)
I am also concerned.  I have recommended assisted living because frequent falls. Is it something I can order that can help? Other than PT. Thanks, BJ

## 2019-04-10 ENCOUNTER — Other Ambulatory Visit: Payer: Self-pay | Admitting: Family Medicine

## 2019-04-10 DIAGNOSIS — R54 Age-related physical debility: Secondary | ICD-10-CM | POA: Diagnosis not present

## 2019-04-10 DIAGNOSIS — I10 Essential (primary) hypertension: Secondary | ICD-10-CM | POA: Diagnosis not present

## 2019-04-10 DIAGNOSIS — M150 Primary generalized (osteo)arthritis: Secondary | ICD-10-CM | POA: Diagnosis not present

## 2019-04-10 DIAGNOSIS — N390 Urinary tract infection, site not specified: Secondary | ICD-10-CM

## 2019-04-10 DIAGNOSIS — M48062 Spinal stenosis, lumbar region with neurogenic claudication: Secondary | ICD-10-CM | POA: Diagnosis not present

## 2019-04-10 DIAGNOSIS — I739 Peripheral vascular disease, unspecified: Secondary | ICD-10-CM | POA: Diagnosis not present

## 2019-04-10 DIAGNOSIS — R2681 Unsteadiness on feet: Secondary | ICD-10-CM | POA: Diagnosis not present

## 2019-04-10 DIAGNOSIS — S42444D Nondisplaced fracture (avulsion) of medial epicondyle of right humerus, subsequent encounter for fracture with routine healing: Secondary | ICD-10-CM | POA: Diagnosis not present

## 2019-04-10 DIAGNOSIS — R296 Repeated falls: Secondary | ICD-10-CM | POA: Diagnosis not present

## 2019-04-10 MED ORDER — CIPROFLOXACIN HCL 500 MG PO TABS: 500.0000 mg | ORAL_TABLET | Freq: Two times a day (BID) | ORAL | 0 refills | Status: AC

## 2019-04-10 NOTE — Telephone Encounter (Signed)
I tried contacting patient to make him aware the Rx was sent in, unable to leave a voicemail due to mailbox being full.  I left [name] [name] a voicemail for him to return my call. I need to inform him that patient does have a UTI and an antibiotic has been sent in, but we haven't been able to reach the patient to inform him.

## 2019-04-10 NOTE — Telephone Encounter (Signed)
[name] [name] was also concerned about a possible uti. I put the report in the top box beside your desk with the results from the culture [facility] did.

## 2019-04-10 NOTE — Telephone Encounter (Signed)
Rx for Cipro sent to his pharmacy. Thanks, BJ

## 2019-04-11 DIAGNOSIS — S42444D Nondisplaced fracture (avulsion) of medial epicondyle of right humerus, subsequent encounter for fracture with routine healing: Secondary | ICD-10-CM | POA: Diagnosis not present

## 2019-04-11 DIAGNOSIS — R2681 Unsteadiness on feet: Secondary | ICD-10-CM | POA: Diagnosis not present

## 2019-04-11 DIAGNOSIS — I739 Peripheral vascular disease, unspecified: Secondary | ICD-10-CM | POA: Diagnosis not present

## 2019-04-11 DIAGNOSIS — I10 Essential (primary) hypertension: Secondary | ICD-10-CM | POA: Diagnosis not present

## 2019-04-11 DIAGNOSIS — M150 Primary generalized (osteo)arthritis: Secondary | ICD-10-CM | POA: Diagnosis not present

## 2019-04-11 DIAGNOSIS — R54 Age-related physical debility: Secondary | ICD-10-CM | POA: Diagnosis not present

## 2019-04-11 DIAGNOSIS — M48062 Spinal stenosis, lumbar region with neurogenic claudication: Secondary | ICD-10-CM | POA: Diagnosis not present

## 2019-04-11 DIAGNOSIS — R296 Repeated falls: Secondary | ICD-10-CM | POA: Diagnosis not present

## 2019-04-12 NOTE — Telephone Encounter (Signed)
I tried contacting patient again, unable to leave a voicemail.

## 2019-04-15 ENCOUNTER — Telehealth: Payer: Self-pay | Admitting: Family Medicine

## 2019-04-15 DIAGNOSIS — R296 Repeated falls: Secondary | ICD-10-CM | POA: Diagnosis not present

## 2019-04-15 DIAGNOSIS — R54 Age-related physical debility: Secondary | ICD-10-CM | POA: Diagnosis not present

## 2019-04-15 DIAGNOSIS — S42444D Nondisplaced fracture (avulsion) of medial epicondyle of right humerus, subsequent encounter for fracture with routine healing: Secondary | ICD-10-CM | POA: Diagnosis not present

## 2019-04-15 DIAGNOSIS — M48062 Spinal stenosis, lumbar region with neurogenic claudication: Secondary | ICD-10-CM | POA: Diagnosis not present

## 2019-04-15 DIAGNOSIS — R2681 Unsteadiness on feet: Secondary | ICD-10-CM | POA: Diagnosis not present

## 2019-04-15 DIAGNOSIS — I739 Peripheral vascular disease, unspecified: Secondary | ICD-10-CM | POA: Diagnosis not present

## 2019-04-15 DIAGNOSIS — M150 Primary generalized (osteo)arthritis: Secondary | ICD-10-CM | POA: Diagnosis not present

## 2019-04-15 DIAGNOSIS — I10 Essential (primary) hypertension: Secondary | ICD-10-CM | POA: Diagnosis not present

## 2019-04-15 NOTE — Telephone Encounter (Signed)
I called and spoke with the pharmacy since I have not been able to get in touch with patient. Rx has been picked up. Patient has a follow up appointment on Wednesday.

## 2019-04-15 NOTE — Telephone Encounter (Signed)
fyi

## 2019-04-15 NOTE — Telephone Encounter (Signed)
[name] [name] from [facility] call to let you know that pt had a fall last night but is alright.

## 2019-04-17 ENCOUNTER — Ambulatory Visit: Payer: Medicare HMO | Admitting: Family Medicine

## 2019-04-19 ENCOUNTER — Other Ambulatory Visit: Payer: Self-pay

## 2019-04-19 ENCOUNTER — Encounter: Payer: Self-pay | Admitting: Family Medicine

## 2019-04-19 ENCOUNTER — Telehealth: Payer: Self-pay | Admitting: Family Medicine

## 2019-04-19 ENCOUNTER — Ambulatory Visit (INDEPENDENT_AMBULATORY_CARE_PROVIDER_SITE_OTHER): Payer: Medicare HMO

## 2019-04-19 ENCOUNTER — Ambulatory Visit (INDEPENDENT_AMBULATORY_CARE_PROVIDER_SITE_OTHER): Payer: Medicare HMO | Admitting: Family Medicine

## 2019-04-19 VITALS — BP 132/80 | HR 65 | Resp 16 | Ht 68.0 in

## 2019-04-19 DIAGNOSIS — M542 Cervicalgia: Secondary | ICD-10-CM

## 2019-04-19 DIAGNOSIS — R296 Repeated falls: Secondary | ICD-10-CM | POA: Diagnosis not present

## 2019-04-19 DIAGNOSIS — M546 Pain in thoracic spine: Secondary | ICD-10-CM | POA: Diagnosis not present

## 2019-04-19 DIAGNOSIS — M48062 Spinal stenosis, lumbar region with neurogenic claudication: Secondary | ICD-10-CM | POA: Diagnosis not present

## 2019-04-19 DIAGNOSIS — M549 Dorsalgia, unspecified: Secondary | ICD-10-CM | POA: Diagnosis not present

## 2019-04-19 DIAGNOSIS — I739 Peripheral vascular disease, unspecified: Secondary | ICD-10-CM | POA: Diagnosis not present

## 2019-04-19 DIAGNOSIS — M25512 Pain in left shoulder: Secondary | ICD-10-CM | POA: Diagnosis not present

## 2019-04-19 DIAGNOSIS — M7021 Olecranon bursitis, right elbow: Secondary | ICD-10-CM | POA: Diagnosis not present

## 2019-04-19 DIAGNOSIS — I10 Essential (primary) hypertension: Secondary | ICD-10-CM | POA: Diagnosis not present

## 2019-04-19 DIAGNOSIS — G894 Chronic pain syndrome: Secondary | ICD-10-CM

## 2019-04-19 DIAGNOSIS — R54 Age-related physical debility: Secondary | ICD-10-CM | POA: Diagnosis not present

## 2019-04-19 DIAGNOSIS — M159 Polyosteoarthritis, unspecified: Secondary | ICD-10-CM

## 2019-04-19 DIAGNOSIS — S4992XA Unspecified injury of left shoulder and upper arm, initial encounter: Secondary | ICD-10-CM | POA: Diagnosis not present

## 2019-04-19 DIAGNOSIS — R2681 Unsteadiness on feet: Secondary | ICD-10-CM | POA: Diagnosis not present

## 2019-04-19 DIAGNOSIS — M150 Primary generalized (osteo)arthritis: Secondary | ICD-10-CM | POA: Diagnosis not present

## 2019-04-19 DIAGNOSIS — S42444D Nondisplaced fracture (avulsion) of medial epicondyle of right humerus, subsequent encounter for fracture with routine healing: Secondary | ICD-10-CM | POA: Diagnosis not present

## 2019-04-19 MED ORDER — OXYCODONE HCL 10 MG PO TABS: 10.0000 mg | ORAL_TABLET | Freq: Two times a day (BID) | ORAL | 0 refills | Status: DC | PRN

## 2019-04-19 NOTE — Telephone Encounter (Signed)
fyi

## 2019-04-19 NOTE — Progress Notes (Signed)
ACUTE VISIT   HPI:  Chief Complaint  Patient presents with  . Fall    [name] is a 79 y.o. male, who is here today complaining of right knee pain, left upper back pain (around scapula), and worsening cervical pain after fall last night. For the past few months he has fallen several times, sometimes 2-3 times per day.  Right elbow pain has improved. Limitations right elbow extension. Occasional shooting pain down to forearm. No erythema.  Last fall was last night around 9:45 pm. He was able to get up with no help. He noted right knee edema.  He also fell in his living room earlier yesterday. [address] when he was trying to sit down,lost balance. This time his son helped him to get up.  Most of the times he falls while getting off from his car. His drive way is uneven, he tries to get down from car slowly, holding door. It door moves or if it is windy , he loses balance. A couple times he has hit his head. Denies headache,visual changes,N/V, or focal weakness.  C/O stiffness and LE burning sensation. Chronic pain: Generalized OA, lumbar stenosis with neurogenic claudication, and cervical DDD. He is on Oxycodone 10 mg bid prn. Also on Cymbalta 60 mg daily.  He has been on same med for years, he was taking higher doses.  HTN: States that this morning PT checked BP and SBP was 155. BP's have been < 140/90 most of the time. He is on Losartan 50 mg daily.  Denies chest pain, dyspnea, palpitation, or focal weakness..  Peri-ankle edema,bilateral. No ankle pain or erythema.  Review of Systems  Constitutional: Positive for fatigue. Negative for appetite change and fever.  HENT: Negative for nosebleeds and sore throat.   Respiratory: Negative for cough and wheezing.   Gastrointestinal: Negative for abdominal pain (Yesterday he had periumbilical abdominal pain than improved after defecation.) and blood in stool.  Genitourinary: Negative for decreased  urine volume, dysuria and hematuria.  Musculoskeletal: Positive for arthralgias and gait problem.  Neurological: Negative for syncope and facial asymmetry.  Rest see pertinent positives and negatives per HPI.   Current Outpatient Medications on File Prior to Visit  Medication Sig Dispense Refill  . DULoxetine (CYMBALTA) 60 MG capsule Take 1 capsule (60 mg total) by mouth daily. 90 capsule 2  . losartan (COZAAR) 50 MG tablet Take 1 tablet (50 mg total) by mouth daily. 90 tablet 2   No current facility-administered medications on file prior to visit.     Past Medical History:  Diagnosis Date  . Back pain   . Renal disorder    kidney stones  . Spinal stenosis, lumbar region, with neurogenic claudication 07/07/2014   No Known Allergies  Social History   Socioeconomic History  . Marital status: Widowed    Spouse name: Not on file  . Number of children: Not on file  . Years of education: Not on file  . Highest education level: Not on file  Occupational History  . Not on file  Tobacco Use  . Smoking status: Current Every Day Smoker    Packs/day: 0.75    Types: Cigarettes  . Smokeless tobacco: Never Used  Substance and Sexual Activity  . Alcohol use: No  . Drug use: Never  . Sexual activity: Not on file  Other Topics Concern  . Not on file  Social History Narrative  . Not on file   Social Determinants of Health  Financial Resource Strain:   . Difficulty of Paying Living Expenses:   Food Insecurity:   . Worried About Charity fundraiser in the Last Year:   . Arboriculturist in the Last Year:   Transportation Needs:   . Film/video editor (Medical):   [name] Lack of Transportation (Non-Medical):   Physical Activity:   . Days of Exercise per Week:   . Minutes of Exercise per Session:   Stress:   . Feeling of Stress :   Social Connections:   . Frequency of Communication with Friends and Family:   . Frequency of Social Gatherings with Friends and Family:   . Attends  Religious Services:   . Active Member of Clubs or Organizations:   . Attends Archivist Meetings:   [name] Marital Status:     Vitals:   04/19/19 1441  BP: 132/80  Pulse: 65  Resp: 16  SpO2: 97%   Body mass index is 22.11 kg/m.  Physical Exam  Nursing note and vitals reviewed. Constitutional: He is oriented to person, place, and time. He appears well-developed and well-nourished. No distress.  HENT:  Head: Normocephalic and atraumatic.  Eyes: Conjunctivae are normal.  Cardiovascular: Normal rate and regular rhythm.  No murmur heard. DP and PT present bilateral.  Respiratory: Effort normal and breath sounds normal. No respiratory distress.  GI: Soft. There is no abdominal tenderness.  Musculoskeletal:        General: Edema (1+ pitting periankle edema,bilateral.) present.     Right elbow: Effusion present. Decreased range of motion.     Left elbow: Decreased range of motion.     Cervical back: Tenderness present. No bony tenderness. Decreased range of motion.     Thoracic back: Tenderness and bony tenderness present.       Back:     Right knee: Effusion present. No deformity or erythema. Decreased range of motion. Tenderness present over the medial joint line.     Comments: Ankle normal ROM,no pain elicited bilateral.  Neurological: He is alert and oriented to person, place, and time. He has normal strength. Gait abnormal.  In a wheel chair today.  Skin: Skin is warm. No rash noted. No erythema.  Psychiatric: He has a normal mood and affect.  Well groomed, good eye contact.    ASSESSMENT AND PLAN:  [name] was seen today for fall.  Diagnoses and all orders for this visit:  Frequent falls This is a very difficult situation. PT has not helped. He has had several falls this year. Risk factors for falls at home, also some of medication and pain can be contributing factors. His son moved with him but most of the time he is not home.  He needs a walker  instead cane.  He needs to consider moving to assisted living.  Cervicalgia Chronic but worse since fall. Further recommendations according to imaging results.  Mid back pain We need to evaluate for vertebral fracture.  Olecranon bursitis of right elbow Pain improved but still sub acromial edema,no erythema. He was supposed to be evaluated by ortho, he was referred last visit, 03/06/19.  Spinal stenosis, lumbar region, with neurogenic claudication He has been on medication for many years. He understands side effects including risk for falls. No changes for now.  -     Oxycodone HCl 10 MG TABS; Take 1 tablet (10 mg total) by mouth 2 (two) times daily as needed.  Chronic pain disorder [facility] controlled subs report reviewed. Med  contract current.  -     Oxycodone HCl 10 MG TABS; Take 1 tablet (10 mg total) by mouth 2 (two) times daily as needed.  Generalized osteoarthritis of multiple sites Fall precautions discussed. No changes in current management.  -     Oxycodone HCl 10 MG TABS; Take 1 tablet (10 mg total) by mouth 2 (two) times daily as needed.  Hypertension, essential, benign BP adequately controlled today. No changes in current management. Continue monitoring BP at home.  Return in about 8 weeks (around 06/14/2019).   Ferne Ellingwood G. [name], MD  [facility] [place] office.

## 2019-04-19 NOTE — Telephone Encounter (Signed)
Mark from [facility] called wanted make Dr. [name] aware that patient had 4 falls today- 2 outside 2 inside. Patient have appointment today 3/19 at 2:30p.

## 2019-04-19 NOTE — Patient Instructions (Signed)
No changes in medications today. We need to prevent falls,you may need to move to assisted living. Continue PT at home. It would be helpful to have your son helping you getting in and off your car.

## 2019-04-22 ENCOUNTER — Telehealth: Payer: Self-pay | Admitting: *Deleted

## 2019-04-22 DIAGNOSIS — I10 Essential (primary) hypertension: Secondary | ICD-10-CM | POA: Diagnosis not present

## 2019-04-22 DIAGNOSIS — I739 Peripheral vascular disease, unspecified: Secondary | ICD-10-CM | POA: Diagnosis not present

## 2019-04-22 DIAGNOSIS — R296 Repeated falls: Secondary | ICD-10-CM | POA: Diagnosis not present

## 2019-04-22 DIAGNOSIS — M150 Primary generalized (osteo)arthritis: Secondary | ICD-10-CM | POA: Diagnosis not present

## 2019-04-22 DIAGNOSIS — M48062 Spinal stenosis, lumbar region with neurogenic claudication: Secondary | ICD-10-CM | POA: Diagnosis not present

## 2019-04-22 DIAGNOSIS — R54 Age-related physical debility: Secondary | ICD-10-CM | POA: Diagnosis not present

## 2019-04-22 DIAGNOSIS — S42444D Nondisplaced fracture (avulsion) of medial epicondyle of right humerus, subsequent encounter for fracture with routine healing: Secondary | ICD-10-CM | POA: Diagnosis not present

## 2019-04-22 DIAGNOSIS — R2681 Unsteadiness on feet: Secondary | ICD-10-CM | POA: Diagnosis not present

## 2019-04-22 NOTE — Telephone Encounter (Signed)
Dr Jordan pt 

## 2019-04-22 NOTE — Telephone Encounter (Signed)
I spoke with [facility]. She will discuss the assisted living with patient and see what he would like to do. She is aware we will re-check his kidneys at the next visit. She said that he is having some pain with his left arm, advised that we did x-rays while he was in office on Friday & are waiting on the results to come back.

## 2019-04-22 NOTE — Telephone Encounter (Signed)
I left a voicemail for patient's sister to return my call to see how patient is doing. Patient saw pcp on Friday.

## 2019-04-22 NOTE — Telephone Encounter (Signed)
I spoke with [facility]. Patient had another fall on Friday when he stopped to get gas. He went to her house on his way home because he wasn't feeling good and he was dizzy. She was not home, so he made his way home to his house. She is going to call him when she gets home to check in on him. She is wanting to know what to do about the falls since PT isn't really helping at this point? She is also wanting to know if he can get a referral to [facility].

## 2019-04-22 NOTE — Telephone Encounter (Signed)
I left [name] a voicemail to return my call.

## 2019-04-22 NOTE — Telephone Encounter (Signed)
Patient sister called the after hours line 04/21/2019. Patient sister reports he isis having pain all over. Brother takes OxyContin. Last dose of was on Friday. n/v and stomach cramps. Generalized pain after fall on Friday 3/19 while walking. Patient struck his head during the fall, takes 81mg  ASA for anticoagulation. Swelling to left forehead and existing right sided bruising to head. Pain in legs and back. Hx of arthritis. Patient was advised to go to ED. Clinic RN see that patient did not go

## 2019-04-22 NOTE — Telephone Encounter (Signed)
We have discussed fall risks and precautions. Apparently he has some risk factors for fall at home. Some of the medication could aggravate problem. I think he needs to consider moving to assisted living.  I am not sure about indication for referral to nephrologist.  His renal function has been normal so far. We can repeat BMP next visit. Thanks, BJ

## 2019-04-23 DIAGNOSIS — M150 Primary generalized (osteo)arthritis: Secondary | ICD-10-CM | POA: Diagnosis not present

## 2019-04-23 DIAGNOSIS — R296 Repeated falls: Secondary | ICD-10-CM | POA: Diagnosis not present

## 2019-04-23 DIAGNOSIS — R54 Age-related physical debility: Secondary | ICD-10-CM | POA: Diagnosis not present

## 2019-04-23 DIAGNOSIS — M48062 Spinal stenosis, lumbar region with neurogenic claudication: Secondary | ICD-10-CM | POA: Diagnosis not present

## 2019-04-23 DIAGNOSIS — I10 Essential (primary) hypertension: Secondary | ICD-10-CM | POA: Diagnosis not present

## 2019-04-23 DIAGNOSIS — I739 Peripheral vascular disease, unspecified: Secondary | ICD-10-CM | POA: Diagnosis not present

## 2019-04-23 DIAGNOSIS — S42444D Nondisplaced fracture (avulsion) of medial epicondyle of right humerus, subsequent encounter for fracture with routine healing: Secondary | ICD-10-CM | POA: Diagnosis not present

## 2019-04-23 DIAGNOSIS — R2681 Unsteadiness on feet: Secondary | ICD-10-CM | POA: Diagnosis not present

## 2019-04-28 ENCOUNTER — Other Ambulatory Visit: Payer: Self-pay | Admitting: Family Medicine

## 2019-04-28 DIAGNOSIS — I739 Peripheral vascular disease, unspecified: Secondary | ICD-10-CM

## 2019-04-29 NOTE — Telephone Encounter (Signed)
The original prescription was discontinued on 02/15/2019 by [name], [name], MD for the following reason: Non-compliance. Renewing this prescription may not be appropriate.  Request denied.

## 2019-04-30 ENCOUNTER — Telehealth: Payer: Self-pay | Admitting: Family Medicine

## 2019-04-30 DIAGNOSIS — R296 Repeated falls: Secondary | ICD-10-CM | POA: Diagnosis not present

## 2019-04-30 DIAGNOSIS — I10 Essential (primary) hypertension: Secondary | ICD-10-CM | POA: Diagnosis not present

## 2019-04-30 DIAGNOSIS — S42444D Nondisplaced fracture (avulsion) of medial epicondyle of right humerus, subsequent encounter for fracture with routine healing: Secondary | ICD-10-CM | POA: Diagnosis not present

## 2019-04-30 DIAGNOSIS — R2681 Unsteadiness on feet: Secondary | ICD-10-CM | POA: Diagnosis not present

## 2019-04-30 DIAGNOSIS — M150 Primary generalized (osteo)arthritis: Secondary | ICD-10-CM | POA: Diagnosis not present

## 2019-04-30 DIAGNOSIS — M48062 Spinal stenosis, lumbar region with neurogenic claudication: Secondary | ICD-10-CM | POA: Diagnosis not present

## 2019-04-30 DIAGNOSIS — I739 Peripheral vascular disease, unspecified: Secondary | ICD-10-CM | POA: Diagnosis not present

## 2019-04-30 DIAGNOSIS — R54 Age-related physical debility: Secondary | ICD-10-CM | POA: Diagnosis not present

## 2019-04-30 NOTE — Telephone Encounter (Signed)
[name] [name] stated the pt fell yesterday while walking back to the house. He was able to get into the house, but he has back and right knee pain from the fall. He wanted to inform PCP.    [name] [name] can be reached at [phone]

## 2019-05-03 ENCOUNTER — Emergency Department: Payer: Medicare HMO

## 2019-05-03 ENCOUNTER — Other Ambulatory Visit: Payer: Self-pay

## 2019-05-03 ENCOUNTER — Emergency Department
Admission: EM | Admit: 2019-05-03 | Discharge: 2019-05-03 | Disposition: A | Discharge status: Home / Self Care | Payer: Medicare HMO | Attending: Emergency Medicine | Admitting: Emergency Medicine

## 2019-05-03 ENCOUNTER — Encounter: Payer: Self-pay | Admitting: Emergency Medicine

## 2019-05-03 DIAGNOSIS — I10 Essential (primary) hypertension: Secondary | ICD-10-CM | POA: Insufficient documentation

## 2019-05-03 DIAGNOSIS — J439 Emphysema, unspecified: Secondary | ICD-10-CM | POA: Diagnosis not present

## 2019-05-03 DIAGNOSIS — R000 Tachycardia, unspecified: Secondary | ICD-10-CM | POA: Diagnosis not present

## 2019-05-03 DIAGNOSIS — R748 Abnormal levels of other serum enzymes: Secondary | ICD-10-CM | POA: Diagnosis not present

## 2019-05-03 DIAGNOSIS — R296 Repeated falls: Secondary | ICD-10-CM | POA: Diagnosis not present

## 2019-05-03 DIAGNOSIS — G9340 Encephalopathy, unspecified: Secondary | ICD-10-CM | POA: Diagnosis not present

## 2019-05-03 DIAGNOSIS — R531 Weakness: Secondary | ICD-10-CM | POA: Insufficient documentation

## 2019-05-03 DIAGNOSIS — E161 Other hypoglycemia: Secondary | ICD-10-CM | POA: Diagnosis not present

## 2019-05-03 DIAGNOSIS — R0902 Hypoxemia: Secondary | ICD-10-CM | POA: Diagnosis not present

## 2019-05-03 DIAGNOSIS — R202 Paresthesia of skin: Secondary | ICD-10-CM | POA: Diagnosis not present

## 2019-05-03 DIAGNOSIS — F17210 Nicotine dependence, cigarettes, uncomplicated: Secondary | ICD-10-CM | POA: Insufficient documentation

## 2019-05-03 DIAGNOSIS — E162 Hypoglycemia, unspecified: Secondary | ICD-10-CM | POA: Diagnosis not present

## 2019-05-03 LAB — COMPREHENSIVE METABOLIC PANEL
ALT: 15 U/L (ref 0–44)
AST: 33 U/L (ref 15–41)
Albumin: 4 g/dL (ref 3.5–5.0)
Alkaline Phosphatase: 117 U/L (ref 38–126)
Anion gap: 12 (ref 5–15)
BUN: 21 mg/dL (ref 8–23)
CO2: 26 mmol/L (ref 22–32)
Calcium: 9.1 mg/dL (ref 8.9–10.3)
Chloride: 103 mmol/L (ref 98–111)
Creatinine, Ser: 0.99 mg/dL (ref 0.61–1.24)
GFR calc Af Amer: >60 mL/min (ref >60)
GFR calc non Af Amer: >60 mL/min (ref >60)
Glucose, Bld: 71 mg/dL (ref 70–99)
Potassium: 4.4 mmol/L (ref 3.5–5.1)
Sodium: 141 mmol/L (ref 135–145)
Total Bilirubin: 1.4 mg/dL — ABNORMAL HIGH (ref 0.3–1.2)
Total Protein: 7.3 g/dL (ref 6.5–8.1)

## 2019-05-03 LAB — CBC WITH DIFFERENTIAL/PLATELET
Abs Immature Granulocytes: 0.02 10*3/uL (ref 0.00–0.07)
Basophils Absolute: 0.1 10*3/uL (ref 0.0–0.1)
Basophils Relative: 1 %
Eosinophils Absolute: 0 10*3/uL (ref 0.0–0.5)
Eosinophils Relative: 0 %
HCT: 55.2 % — ABNORMAL HIGH (ref 39.0–52.0)
Hemoglobin: 16.7 g/dL (ref 13.0–17.0)
Immature Granulocytes: 0 %
Lymphocytes Relative: 13 %
Lymphs Abs: 1.4 10*3/uL (ref 0.7–4.0)
MCH: 26.9 pg (ref 26.0–34.0)
MCHC: 30.3 g/dL (ref 30.0–36.0)
MCV: 89 fL (ref 80.0–100.0)
Monocytes Absolute: 1.1 10*3/uL — ABNORMAL HIGH (ref 0.1–1.0)
Monocytes Relative: 10 %
Neutro Abs: 8.4 10*3/uL — ABNORMAL HIGH (ref 1.7–7.7)
Neutrophils Relative %: 76 %
Platelets: 230 10*3/uL (ref 150–400)
RBC: 6.2 MIL/uL — ABNORMAL HIGH (ref 4.22–5.81)
RDW: 14.6 % (ref 11.5–15.5)
WBC: 11 10*3/uL — ABNORMAL HIGH (ref 4.0–10.5)
nRBC: 0 % (ref 0.0–0.2)

## 2019-05-03 LAB — CK: Total CK: 528 U/L — ABNORMAL HIGH (ref 49–397)

## 2019-05-03 LAB — TROPONIN I (HIGH SENSITIVITY)
Troponin I (High Sensitivity): 34 ng/L — ABNORMAL HIGH (ref <18)
Troponin I (High Sensitivity): 34 ng/L — ABNORMAL HIGH (ref <18)

## 2019-05-03 MED ORDER — SODIUM CHLORIDE 0.9 % IV BOLUS
500.0000 mL | Freq: Once | INTRAVENOUS | Status: AC
  Administered 2019-05-03: 500 mL via INTRAVENOUS

## 2019-05-03 NOTE — ED Triage Notes (Signed)
Pt presents via acems. Ems reports that pt was found in car on side of road. Bystander called ems due to pt being "slumped over" on steering wheel. Pt states his car broke down last night and he had to sleep in car due to pt not being able to get ride. Pt had urinated on self when ems arrived as well. Pt c/o generalized weakness as well. Pt oriented x3, disoriented to time. Pt family at bedside reports pt has had multiple falls over the past few weeks, as well as increased confusion. Hematoma noted to pt's forehead.

## 2019-05-03 NOTE — ED Notes (Addendum)
PT's sister to doorway at 67 to inform staff that pt needs to urinate. Upon this RN entering room, pt had already urinated on himself. PT's dirty clothes and sheets removed, cleaned up and placed in a brief. Attempted to let pt urinate into urinal but pt states he doesn't have to go anymore.  PT has been informed of need for urine sample and has a call bell and urinal at bedside.

## 2019-05-03 NOTE — ED Notes (Signed)
Pt to CT

## 2019-05-03 NOTE — ED Provider Notes (Signed)
[facility] Emergency Department Provider Note       Time seen: ----------------------------------------- 4:33 PM on 05/03/2019 -----------------------------------------   I have reviewed the triage vital signs and the nursing notes.  HISTORY   Chief Complaint No chief complaint on file.   HPI [name] [name] is a 79 y.o. male with a history of spinal stenosis, renal disorder, aortic atherosclerosis, chronic pain, degenerative disc disease who presents to the ED after his car broke down and he spent the night all night in his truck.  Reportedly his truck had broken down and he tried to call for help but no one came.  A good Samaritan saw him asleep in his truck and called EMS.  He denies any specific complaints.  Past Medical History:  Diagnosis Date  . Back pain   . Renal disorder    kidney stones  . Spinal stenosis, lumbar region, with neurogenic claudication 07/07/2014    Patient Active Problem List   Diagnosis Date Noted  . Aortic atherosclerosis ([place]) 02/16/2019  . Hypertension, essential, benign 08/13/2018  . Urinary frequency 02/05/2018  . PAD (peripheral artery disease) (HCC)-Mild 12/26/2017  . Chronic pain disorder 11/17/2017  . Unstable gait 11/17/2017  . BPH associated with nocturia 09/26/2017  . Allergic rhinitis 06/09/2017  . Tobacco use disorder 02/14/2017  . Generalized osteoarthritis of multiple sites 02/14/2017  . Bilateral lower extremity edema 08/12/2016  . Chronic right-sided low back pain with right-sided sciatica 03/14/2016  . Chronic pain of right knee 03/14/2016  . DDD (degenerative disc disease), lumbar 07/07/2014  . DDD (degenerative disc disease), cervical 07/07/2014  . DJD (degenerative joint disease) of knee 07/07/2014  . Bilateral occipital neuralgia 07/07/2014  . Spinal stenosis, lumbar region, with neurogenic claudication 07/07/2014    Past Surgical History:  Procedure Laterality Date  . CATARACT  EXTRACTION W/PHACO Left 08/22/2016   Procedure: CATARACT EXTRACTION PHACO AND INTRAOCULAR LENS PLACEMENT ([place]) Left;  Surgeon: [name], MD;  Location: [place];  Service: Ophthalmology;  Laterality: Left;  . CATARACT EXTRACTION W/PHACO Right 10/25/2016   Procedure: CATARACT EXTRACTION PHACO AND INTRAOCULAR LENS PLACEMENT ([place]) WITH ISTENT RIGHT  ;  Surgeon: [name], MD;  Location: [place];  Service: Ophthalmology;  Laterality: Right;  TOPICAL RIGHT  ISTENT   trabecular bypass stent was implanted/explanted.  . INGUINAL HERNIA REPAIR    . KNEE SURGERY Right   . NECK SURGERY      Allergies Patient has no known allergies.  Social History Social History   Tobacco Use  . Smoking status: Current Every Day Smoker    Packs/day: 0.75    Types: Cigarettes  . Smokeless tobacco: Never Used  Substance Use Topics  . Alcohol use: No  . Drug use: Never    Review of Systems Constitutional: Negative for fever. Cardiovascular: Negative for chest pain. Respiratory: Negative for shortness of breath. Gastrointestinal: Negative for abdominal pain, vomiting and diarrhea. Musculoskeletal: Negative for back pain. Skin: Negative for rash. Neurological: Positive for weakness  All systems negative/normal/unremarkable except as stated in the HPI  ____________________________________________   PHYSICAL EXAM:  VITAL SIGNS: ED Triage Vitals  Enc Vitals Group     BP      Pulse      Resp      Temp      Temp src      SpO2      Weight      Height      Head Circumference  Peak Flow      Pain Score      Pain Loc      Pain Edu?      Excl. in [place]?     Constitutional: Alert but disoriented.  No distress Eyes: Conjunctivae are normal. Normal extraocular movements. ENT      Head: Normocephalic and atraumatic.      Nose: No congestion/rhinnorhea.      Mouth/Throat: Mucous membranes are moist.      Neck: No stridor. Cardiovascular: Normal rate,  regular rhythm. No murmurs, rubs, or gallops. Respiratory: Normal respiratory effort without tachypnea nor retractions. Breath sounds are clear and equal bilaterally. No wheezes/rales/rhonchi. Gastrointestinal: Soft and nontender. Normal bowel sounds Musculoskeletal: Limited range of motion of the extremities, legs are cool to touch Neurologic:  Normal speech and language. No gross focal neurologic deficits are appreciated.  Generalized weakness, nothing focal Skin:  Skin is warm, dry and intact. No rash noted. Psychiatric: Flat affect ___________________________________________  ED COURSE:  As part of my medical decision making, I reviewed the following data within the [place] History obtained from family if available, nursing notes, old chart and ekg, as well as notes from prior ED visits. Patient presented for weakness and after he was discovered to be slumped over in his vehicle, we will assess with labs and imaging as indicated at this time. Clinical Course as of May 02 2021  Fri May 03, 2019  1823 Patient is doing well, mildly elevated CK, chronic elevated troponin which we we will recheck.  I have given him something to eat.   [JW]    Clinical Course User Index [JW] Earleen Newport, MD   Procedures  [name] [name] was evaluated in Emergency Department on 05/03/2019 for the symptoms described in the history of present illness. He was evaluated in the context of the global COVID-19 pandemic, which necessitated consideration that the patient might be at risk for infection with the SARS-CoV-2 virus that causes COVID-19. Institutional protocols and algorithms that pertain to the evaluation of patients at risk for COVID-19 are in a state of rapid change based on information released by regulatory bodies including the CDC and federal and state organizations. These policies and algorithms were followed during the patient's care in the ED.   ____________________________________________   LABS (pertinent positives/negatives)  Labs Reviewed  CBC WITH DIFFERENTIAL/PLATELET - Abnormal; Notable for the following components:      Result Value   WBC 11.0 (*)    RBC 6.20 (*)    HCT 55.2 (*)    Neutro Abs 8.4 (*)    Monocytes Absolute 1.1 (*)    All other components within normal limits  CK - Abnormal; Notable for the following components:   Total CK 528 (*)    All other components within normal limits  COMPREHENSIVE METABOLIC PANEL - Abnormal; Notable for the following components:   Total Bilirubin 1.4 (*)    All other components within normal limits  TROPONIN I (HIGH SENSITIVITY) - Abnormal; Notable for the following components:   Troponin I (High Sensitivity) 34 (*)    All other components within normal limits  TROPONIN I (HIGH SENSITIVITY) - Abnormal; Notable for the following components:   Troponin I (High Sensitivity) 34 (*)    All other components within normal limits  URINALYSIS, COMPLETE (UACMP) WITH MICROSCOPIC    RADIOLOGY Images were viewed by me  CT head, chest x-ray IMPRESSION: 1. No CT evidence for acute intracranial abnormality. 2. Atrophy  and chronic small vessel ischemic change of the white matter. IMPRESSION: 1. Stable emphysema, no acute process. ____________________________________________   DIFFERENTIAL DIAGNOSIS   CVA, TIA, generalized weakness, dehydration, electrolyte abnormality, occult infection  FINAL ASSESSMENT AND PLAN  Weakness, mildly elevated CK level   Plan: The patient had presented for weakness. Patient's labs revealed a mildly elevated CK level but no other acute process.  Troponin appears to be chronically elevated and is stable on repeat check. Patient's imaging was reassuring.  Patient was able to eat well here and family is going to take him home.  He is cleared for outpatient follow-up.   Laurence Aly, MD    Note: This note was generated in part or whole  with voice recognition software. Voice recognition is usually quite accurate but there are transcription errors that can and very often do occur. I apologize for any typographical errors that were not detected and corrected.     Earleen Newport, MD 05/03/19 2025

## 2019-05-06 DIAGNOSIS — M150 Primary generalized (osteo)arthritis: Secondary | ICD-10-CM | POA: Diagnosis not present

## 2019-05-06 DIAGNOSIS — R54 Age-related physical debility: Secondary | ICD-10-CM | POA: Diagnosis not present

## 2019-05-06 DIAGNOSIS — M48062 Spinal stenosis, lumbar region with neurogenic claudication: Secondary | ICD-10-CM | POA: Diagnosis not present

## 2019-05-06 DIAGNOSIS — S42444D Nondisplaced fracture (avulsion) of medial epicondyle of right humerus, subsequent encounter for fracture with routine healing: Secondary | ICD-10-CM | POA: Diagnosis not present

## 2019-05-06 DIAGNOSIS — R2681 Unsteadiness on feet: Secondary | ICD-10-CM | POA: Diagnosis not present

## 2019-05-06 DIAGNOSIS — R296 Repeated falls: Secondary | ICD-10-CM | POA: Diagnosis not present

## 2019-05-06 DIAGNOSIS — I739 Peripheral vascular disease, unspecified: Secondary | ICD-10-CM | POA: Diagnosis not present

## 2019-05-06 DIAGNOSIS — I10 Essential (primary) hypertension: Secondary | ICD-10-CM | POA: Diagnosis not present

## 2019-05-07 ENCOUNTER — Encounter: Payer: Self-pay | Admitting: Family Medicine

## 2019-05-09 DIAGNOSIS — R2681 Unsteadiness on feet: Secondary | ICD-10-CM | POA: Diagnosis not present

## 2019-05-09 DIAGNOSIS — I10 Essential (primary) hypertension: Secondary | ICD-10-CM | POA: Diagnosis not present

## 2019-05-09 DIAGNOSIS — R296 Repeated falls: Secondary | ICD-10-CM | POA: Diagnosis not present

## 2019-05-09 DIAGNOSIS — M150 Primary generalized (osteo)arthritis: Secondary | ICD-10-CM | POA: Diagnosis not present

## 2019-05-09 DIAGNOSIS — S42444D Nondisplaced fracture (avulsion) of medial epicondyle of right humerus, subsequent encounter for fracture with routine healing: Secondary | ICD-10-CM | POA: Diagnosis not present

## 2019-05-09 DIAGNOSIS — I739 Peripheral vascular disease, unspecified: Secondary | ICD-10-CM | POA: Diagnosis not present

## 2019-05-09 DIAGNOSIS — R54 Age-related physical debility: Secondary | ICD-10-CM | POA: Diagnosis not present

## 2019-05-09 DIAGNOSIS — M48062 Spinal stenosis, lumbar region with neurogenic claudication: Secondary | ICD-10-CM | POA: Diagnosis not present

## 2019-05-13 DIAGNOSIS — M48062 Spinal stenosis, lumbar region with neurogenic claudication: Secondary | ICD-10-CM | POA: Diagnosis not present

## 2019-05-13 DIAGNOSIS — I739 Peripheral vascular disease, unspecified: Secondary | ICD-10-CM | POA: Diagnosis not present

## 2019-05-13 DIAGNOSIS — S42444D Nondisplaced fracture (avulsion) of medial epicondyle of right humerus, subsequent encounter for fracture with routine healing: Secondary | ICD-10-CM | POA: Diagnosis not present

## 2019-05-13 DIAGNOSIS — I10 Essential (primary) hypertension: Secondary | ICD-10-CM | POA: Diagnosis not present

## 2019-05-13 DIAGNOSIS — R2681 Unsteadiness on feet: Secondary | ICD-10-CM | POA: Diagnosis not present

## 2019-05-13 DIAGNOSIS — M150 Primary generalized (osteo)arthritis: Secondary | ICD-10-CM | POA: Diagnosis not present

## 2019-05-13 DIAGNOSIS — R296 Repeated falls: Secondary | ICD-10-CM | POA: Diagnosis not present

## 2019-05-13 DIAGNOSIS — R54 Age-related physical debility: Secondary | ICD-10-CM | POA: Diagnosis not present

## 2019-05-15 DIAGNOSIS — S42444D Nondisplaced fracture (avulsion) of medial epicondyle of right humerus, subsequent encounter for fracture with routine healing: Secondary | ICD-10-CM | POA: Diagnosis not present

## 2019-05-15 DIAGNOSIS — I10 Essential (primary) hypertension: Secondary | ICD-10-CM | POA: Diagnosis not present

## 2019-05-15 DIAGNOSIS — R2681 Unsteadiness on feet: Secondary | ICD-10-CM | POA: Diagnosis not present

## 2019-05-15 DIAGNOSIS — I739 Peripheral vascular disease, unspecified: Secondary | ICD-10-CM | POA: Diagnosis not present

## 2019-05-15 DIAGNOSIS — R296 Repeated falls: Secondary | ICD-10-CM | POA: Diagnosis not present

## 2019-05-15 DIAGNOSIS — M150 Primary generalized (osteo)arthritis: Secondary | ICD-10-CM | POA: Diagnosis not present

## 2019-05-15 DIAGNOSIS — M48062 Spinal stenosis, lumbar region with neurogenic claudication: Secondary | ICD-10-CM | POA: Diagnosis not present

## 2019-05-15 DIAGNOSIS — R54 Age-related physical debility: Secondary | ICD-10-CM | POA: Diagnosis not present

## 2019-05-21 DIAGNOSIS — R296 Repeated falls: Secondary | ICD-10-CM | POA: Diagnosis not present

## 2019-05-21 DIAGNOSIS — R2681 Unsteadiness on feet: Secondary | ICD-10-CM | POA: Diagnosis not present

## 2019-05-21 DIAGNOSIS — S42444D Nondisplaced fracture (avulsion) of medial epicondyle of right humerus, subsequent encounter for fracture with routine healing: Secondary | ICD-10-CM | POA: Diagnosis not present

## 2019-05-21 DIAGNOSIS — M48062 Spinal stenosis, lumbar region with neurogenic claudication: Secondary | ICD-10-CM | POA: Diagnosis not present

## 2019-05-21 DIAGNOSIS — M150 Primary generalized (osteo)arthritis: Secondary | ICD-10-CM | POA: Diagnosis not present

## 2019-05-21 DIAGNOSIS — I739 Peripheral vascular disease, unspecified: Secondary | ICD-10-CM | POA: Diagnosis not present

## 2019-05-21 DIAGNOSIS — R54 Age-related physical debility: Secondary | ICD-10-CM | POA: Diagnosis not present

## 2019-05-21 DIAGNOSIS — I10 Essential (primary) hypertension: Secondary | ICD-10-CM | POA: Diagnosis not present

## 2019-05-22 DIAGNOSIS — R296 Repeated falls: Secondary | ICD-10-CM | POA: Diagnosis not present

## 2019-05-22 DIAGNOSIS — R2681 Unsteadiness on feet: Secondary | ICD-10-CM | POA: Diagnosis not present

## 2019-05-22 DIAGNOSIS — M150 Primary generalized (osteo)arthritis: Secondary | ICD-10-CM | POA: Diagnosis not present

## 2019-05-22 DIAGNOSIS — I10 Essential (primary) hypertension: Secondary | ICD-10-CM | POA: Diagnosis not present

## 2019-05-22 DIAGNOSIS — I739 Peripheral vascular disease, unspecified: Secondary | ICD-10-CM | POA: Diagnosis not present

## 2019-05-22 DIAGNOSIS — R54 Age-related physical debility: Secondary | ICD-10-CM | POA: Diagnosis not present

## 2019-05-22 DIAGNOSIS — M48062 Spinal stenosis, lumbar region with neurogenic claudication: Secondary | ICD-10-CM | POA: Diagnosis not present

## 2019-05-22 DIAGNOSIS — S42444D Nondisplaced fracture (avulsion) of medial epicondyle of right humerus, subsequent encounter for fracture with routine healing: Secondary | ICD-10-CM | POA: Diagnosis not present

## 2019-06-04 ENCOUNTER — Telehealth: Payer: Self-pay | Admitting: *Deleted

## 2019-06-04 NOTE — Telephone Encounter (Signed)
Patient requests refill of Oxycodone and Cymbalta.

## 2019-06-06 ENCOUNTER — Telehealth: Payer: Self-pay | Admitting: Family Medicine

## 2019-06-06 NOTE — Telephone Encounter (Signed)
Pt call and stated he need a refill on his pain medication Oxycodone HCl 10 MG TABS sent to  [facility] [place], [place] [facility] Phone:  [phone]  Fax:  [phone]

## 2019-06-07 ENCOUNTER — Other Ambulatory Visit: Payer: Self-pay | Admitting: Family Medicine

## 2019-06-07 DIAGNOSIS — M48062 Spinal stenosis, lumbar region with neurogenic claudication: Secondary | ICD-10-CM

## 2019-06-07 DIAGNOSIS — G894 Chronic pain syndrome: Secondary | ICD-10-CM

## 2019-06-07 DIAGNOSIS — M159 Polyosteoarthritis, unspecified: Secondary | ICD-10-CM

## 2019-06-07 MED ORDER — OXYCODONE HCL 10 MG PO TABS: 10.0000 mg | ORAL_TABLET | Freq: Two times a day (BID) | ORAL | 0 refills | Status: DC | PRN

## 2019-06-07 MED ORDER — DULOXETINE HCL 60 MG PO CPEP: 60.0000 mg | ORAL_CAPSULE | Freq: Every day | ORAL | 2 refills | Status: DC

## 2019-06-07 NOTE — Telephone Encounter (Signed)
Patient notified. Follow-up scheduled for 07/19/2019.

## 2019-06-07 NOTE — Telephone Encounter (Signed)
Rx's sent. I do not see a f/u appt. Please remind him he is due for follow up in 07/2019. Thanks, BJ

## 2019-06-07 NOTE — Telephone Encounter (Signed)
Noted  

## 2019-06-07 NOTE — Telephone Encounter (Signed)
Message Routed to PCP for review and approval. 

## 2019-06-07 NOTE — Telephone Encounter (Signed)
Rx's sent. ?Thanks, ?BJ ?

## 2019-06-24 ENCOUNTER — Telehealth: Payer: Self-pay | Admitting: Family Medicine

## 2019-06-24 NOTE — Telephone Encounter (Signed)
error 

## 2019-07-08 ENCOUNTER — Ambulatory Visit: Payer: Medicare HMO | Admitting: Family Medicine

## 2019-07-16 ENCOUNTER — Encounter: Payer: Self-pay | Admitting: Family Medicine

## 2019-07-16 ENCOUNTER — Other Ambulatory Visit: Payer: Self-pay

## 2019-07-16 ENCOUNTER — Ambulatory Visit (INDEPENDENT_AMBULATORY_CARE_PROVIDER_SITE_OTHER): Payer: Medicare HMO | Admitting: Family Medicine

## 2019-07-16 VITALS — BP 145/80 | HR 71 | Temp 97.8°F | Resp 16 | Ht 68.0 in | Wt 141.5 lb

## 2019-07-16 DIAGNOSIS — M159 Polyosteoarthritis, unspecified: Secondary | ICD-10-CM | POA: Diagnosis not present

## 2019-07-16 DIAGNOSIS — I10 Essential (primary) hypertension: Secondary | ICD-10-CM

## 2019-07-16 DIAGNOSIS — G894 Chronic pain syndrome: Secondary | ICD-10-CM | POA: Diagnosis not present

## 2019-07-16 DIAGNOSIS — R600 Localized edema: Secondary | ICD-10-CM | POA: Diagnosis not present

## 2019-07-16 DIAGNOSIS — R296 Repeated falls: Secondary | ICD-10-CM | POA: Diagnosis not present

## 2019-07-16 DIAGNOSIS — I739 Peripheral vascular disease, unspecified: Secondary | ICD-10-CM | POA: Diagnosis not present

## 2019-07-16 DIAGNOSIS — M48062 Spinal stenosis, lumbar region with neurogenic claudication: Secondary | ICD-10-CM | POA: Diagnosis not present

## 2019-07-16 MED ORDER — HYDROCHLOROTHIAZIDE 12.5 MG PO TABS: 12.5000 mg | ORAL_TABLET | Freq: Every day | ORAL | 1 refills | Status: DC

## 2019-07-16 NOTE — Progress Notes (Signed)
HPI:  [name] is a 79 y.o. male, who is here today for chronic disease management.  He was last seen on 04/19/19 because fall. No new problems sine his last visit. Today he is c/o a month of LE edema. He has had problem in the past. He did not want furosemide because aggravated urine frequency.  He has not identified exacerbating or alleviating factors. States that it looks similar in the morning when he gets up. No erythema or calves pain. No hx of trauma.  HTN: He is on Losartan 50 mg daily. Denies severe/frequent headache, visual changes, chest pain, dyspnea, palpitation,or focal weakness. BP elevated today. He is not checking BP regularly.  Lab Results  Component Value Date   CREATININE 0.99 05/03/2019   BUN 21 05/03/2019   NA 141 05/03/2019   K 4.4 05/03/2019   CL 103 05/03/2019   CO2 26 05/03/2019   PAD, he is not taking statin med, he is not sure why he discontinued it. Negative for cold extremities,cysnosis,or ulcers.  Falls sine his last OV,"too many to count." No serious injury or head trauma, th latter one has happened with previous falls. States that he has fallen recently when trying to fix things around his house. His son is staying with him but he sees him 2-3 times per week.  [facility] services for PT several times, has not decreased falls. He has a walker.  Chronic pain: Generalized OA,back and LE's pain. Spinal stenosis with radicular pain. Did not tolerate Gabapentin well. He is on Cymbalta 60 mg daily and Oxycodone 10 mg bid.  He has been on Oxycodone for years. He was following with pain manager (Dr Jeani Hawking) until his insurance changed net work.  ABI LE's in 12/2017:  Right:  Resting right ankle-brachial index indicates mild right lower extremity arterial disease.  The right toe-brachial index is abnormal.   Left:  Resting left ankle-brachial index indicates mild left lower extremity arterial disease.  The left toe-brachial  index is abnormal.  Probable tibial vessel disease, bilaterally.  Review of Systems  Constitutional: Positive for activity change. Negative for appetite change, fatigue and fever.  HENT: Negative for nosebleeds and sore throat.   Eyes: Negative for redness and visual disturbance.  Respiratory: Positive for cough (In the morning.). Negative for wheezing.   Gastrointestinal: Negative for abdominal pain, nausea and vomiting.  Genitourinary: Negative for decreased urine volume, dysuria and hematuria.  Musculoskeletal: Positive for arthralgias, back pain and gait problem.  Skin: Negative for rash and wound.  Neurological: Negative for syncope, facial asymmetry and headaches.  Psychiatric/Behavioral: Negative for confusion and hallucinations.  Rest of ROS, see pertinent positives sand negatives in HPI  Current Outpatient Medications on File Prior to Visit  Medication Sig Dispense Refill  . DULoxetine (CYMBALTA) 60 MG capsule Take 1 capsule (60 mg total) by mouth daily. 90 capsule 2  . losartan (COZAAR) 50 MG tablet Take 1 tablet (50 mg total) by mouth daily. 90 tablet 2   No current facility-administered medications on file prior to visit.   Past Medical History:  Diagnosis Date  . Back pain   . Renal disorder    kidney stones  . Spinal stenosis, lumbar region, with neurogenic claudication 07/07/2014   No Known Allergies  Social History   Socioeconomic History  . Marital status: Widowed    Spouse name: Not on file  . Number of children: Not on file  . Years of education: Not on file  . Highest  education level: Not on file  Occupational History  . Not on file  Tobacco Use  . Smoking status: Current Every Day Smoker    Packs/day: 0.75    Types: Cigarettes  . Smokeless tobacco: Never Used  Vaping Use  . Vaping Use: Never used  Substance and Sexual Activity  . Alcohol use: No  . Drug use: Never  . Sexual activity: Not on file  Other Topics Concern  . Not on file  Social  History Narrative  . Not on file   Social Determinants of Health   Financial Resource Strain:   . Difficulty of Paying Living Expenses:   Food Insecurity:   . Worried About Charity fundraiser in the Last Year:   . Arboriculturist in the Last Year:   Transportation Needs:   . Film/video editor (Medical):   [name] Lack of Transportation (Non-Medical):   Physical Activity:   . Days of Exercise per Week:   . Minutes of Exercise per Session:   Stress:   . Feeling of Stress :   Social Connections:   . Frequency of Communication with Friends and Family:   . Frequency of Social Gatherings with Friends and Family:   . Attends Religious Services:   . Active Member of Clubs or Organizations:   . Attends Archivist Meetings:   [name] Marital Status:     Vitals:   07/16/19 1559 07/16/19 1731  BP: (!) 160/80 (!) 145/80  Pulse: 71   Resp: 16   Temp: 97.8 F (36.6 C)   SpO2: 94%    Body mass index is 21.52 kg/m.  Physical Exam  Nursing note reviewed. Constitutional: He is oriented to person, place, and time. He appears well-developed. No distress.  HENT:  Head: Normocephalic and atraumatic.  Mouth/Throat: Mucous membranes are moist.  Eyes: Conjunctivae are normal.  Cardiovascular: Normal rate and regular rhythm.  No murmur heard. DP pulses present bilateral.  Respiratory: Effort normal and breath sounds normal. No respiratory distress.  GI: Soft. Normal appearance. There is no abdominal tenderness.  Musculoskeletal:     Right lower leg: 2+ Edema present.     Left lower leg: 2+ Edema present.     Comments: Pedal pitting edema , bilateral.  Lymphadenopathy:    He has no cervical adenopathy.  Neurological: He is alert and oriented to person, place, and time. Gait abnormal.  Unstable gait assisted with a walker.  Skin: Skin is warm. No rash noted. No erythema.  Psychiatric: Mood and affect normal.  Well groomed, good eye contact.   ASSESSMENT AND PLAN:   [name] was seen today for chronic disease management.  Diagnoses and all orders for this visit:  Generalized osteoarthritis of multiple sites Otherwise stable. Continue Duloxetine 60 mg daily and Oxycodone 10 mg bid. Side effects discussed.  -     Oxycodone HCl 10 MG TABS; Take 1 tablet (10 mg total) by mouth 2 (two) times daily as needed.  Spinal stenosis, lumbar region, with neurogenic claudication Continue Duloxetine and Oxycodone.  -     Oxycodone HCl 10 MG TABS; Take 1 tablet (10 mg total) by mouth 2 (two) times daily as needed. -     Oxycodone HCl 10 MG TABS; Take 1 tablet (10 mg total) by mouth 2 (two) times daily as needed.  Hypertension, essential, benign BP re-checked and improved. Continue Losartan 50 mg daily. HCTZ 12.5 mg added. Monitor BP is possible. Low salt diet  recommended.  -     hydrochlorothiazide (HYDRODIURIL) 12.5 MG tablet; Take 1 tablet (12.5 mg total) by mouth daily.  Frequent falls Several risk factors, some of his chronic health problems and medications. We discussed possible complications of falls. Strongly recommend avoiding activities that can increase risk for falls. He needs to consider moving to independent living facility.  Bilateral lower extremity edema ? Venous disease. Compression stocking and HCTZ 12.5 mg may help. LE elevation above waist level. Good skin care.  PAD (peripheral artery disease) ([place]) He is not on statin or Aspirin. Given his frequent falls the latter one can cause bleeding complications.  Chronic pain disorder [name] [name] controlled subs report reviewed. He understands side effects of medications.  -     Oxycodone HCl 10 MG TABS; Take 1 tablet (10 mg total) by mouth 2 (two) times daily as needed. -     Oxycodone HCl 10 MG TABS; Take 1 tablet (10 mg total) by mouth 2 (two) times daily as needed.    Return in about 4 weeks (around 08/13/2019) for htn.   Timarion Agcaoili G. [name], MD  [facility]. [place] office.   A few things to remember from today's visit:   Generalized osteoarthritis of multiple sites  Spinal stenosis, lumbar region, with neurogenic claudication  Hypertension, essential, benign - Plan: hydrochlorothiazide (HYDRODIURIL) 12.5 MG tablet  Frequent falls  Bilateral lower extremity edema  PAD (peripheral artery disease) (HCC)  Today we added Hydrochlorothiazide 12.5 mg to take in the morning. Take Losartan at night.  You were supposed to take cholesterol med for circulation.  Please bring your meds next visit.  No changes in Cymbalta or Oxycodone.  If you need refills please call your pharmacy. Do not use My Chart to request refills or for acute issues that need immediate attention.    Please be sure medication list is accurate. If a new problem present, please set up appointment sooner than planned today.

## 2019-07-16 NOTE — Patient Instructions (Signed)
A few things to remember from today's visit:   Generalized osteoarthritis of multiple sites  Spinal stenosis, lumbar region, with neurogenic claudication  Hypertension, essential, benign - Plan: hydrochlorothiazide (HYDRODIURIL) 12.5 MG tablet  Frequent falls  Bilateral lower extremity edema  PAD (peripheral artery disease) (HCC)  Today we added Hydrochlorothiazide 12.5 mg to take in the morning. Take Losartan at night.  You were supposed to take cholesterol med for circulation.  Please bring your meds next visit.  No changes in Cymbalta or Oxycodone.  If you need refills please call your pharmacy. Do not use My Chart to request refills or for acute issues that need immediate attention.    Please be sure medication list is accurate. If a new problem present, please set up appointment sooner than planned today.

## 2019-07-18 ENCOUNTER — Encounter: Payer: Self-pay | Admitting: Family Medicine

## 2019-07-18 MED ORDER — OXYCODONE HCL 10 MG PO TABS: 10.0000 mg | ORAL_TABLET | Freq: Two times a day (BID) | ORAL | 0 refills | Status: DC | PRN

## 2019-07-19 ENCOUNTER — Ambulatory Visit: Payer: Medicare HMO | Admitting: Family Medicine

## 2019-08-05 ENCOUNTER — Other Ambulatory Visit: Payer: Self-pay | Admitting: Family Medicine

## 2019-08-05 DIAGNOSIS — R350 Frequency of micturition: Secondary | ICD-10-CM

## 2019-08-22 ENCOUNTER — Telehealth: Payer: Self-pay | Admitting: Family Medicine

## 2019-08-22 NOTE — Telephone Encounter (Signed)
Pt stated he contacted his pharmacy for a refill and was told that he is not due for one. Last refill: 07/18/19   Medication Refill:  Oxycodone  Pharmacy: [facility] [place], [place] [facility] Phone:  [phone]  Fax:  [phone]

## 2019-08-23 NOTE — Telephone Encounter (Signed)
Rx last filled 07/18/19.

## 2019-08-26 NOTE — Telephone Encounter (Signed)
I called and spoke with pharmacy. Rx is ready for pick up. Tried contacting pt, unable to leave a voicemail.

## 2019-08-26 NOTE — Telephone Encounter (Signed)
He has a postdated Rx for Oxycodone, 08/17/19. Can you please verify with pharmacy. Thanks, BJ

## 2019-09-19 ENCOUNTER — Other Ambulatory Visit: Payer: Self-pay

## 2019-09-19 ENCOUNTER — Inpatient Hospital Stay
Admission: EM | Admit: 2019-09-19 | Discharge: 2019-09-22 | DRG: 682 | Disposition: A | Discharge status: Skilled Nursing Facility | Payer: Medicare HMO | Attending: Hospitalist | Admitting: Hospitalist

## 2019-09-19 ENCOUNTER — Emergency Department: Payer: Medicare HMO

## 2019-09-19 DIAGNOSIS — S0990XA Unspecified injury of head, initial encounter: Secondary | ICD-10-CM | POA: Diagnosis not present

## 2019-09-19 DIAGNOSIS — N400 Enlarged prostate without lower urinary tract symptoms: Secondary | ICD-10-CM | POA: Diagnosis not present

## 2019-09-19 DIAGNOSIS — Z9841 Cataract extraction status, right eye: Secondary | ICD-10-CM | POA: Diagnosis not present

## 2019-09-19 DIAGNOSIS — Z79899 Other long term (current) drug therapy: Secondary | ICD-10-CM

## 2019-09-19 DIAGNOSIS — Z9842 Cataract extraction status, left eye: Secondary | ICD-10-CM

## 2019-09-19 DIAGNOSIS — Z20822 Contact with and (suspected) exposure to covid-19: Secondary | ICD-10-CM | POA: Diagnosis not present

## 2019-09-19 DIAGNOSIS — R278 Other lack of coordination: Secondary | ICD-10-CM | POA: Diagnosis not present

## 2019-09-19 DIAGNOSIS — R531 Weakness: Secondary | ICD-10-CM | POA: Diagnosis not present

## 2019-09-19 DIAGNOSIS — M4800 Spinal stenosis, site unspecified: Secondary | ICD-10-CM | POA: Diagnosis not present

## 2019-09-19 DIAGNOSIS — R2681 Unsteadiness on feet: Secondary | ICD-10-CM | POA: Diagnosis not present

## 2019-09-19 DIAGNOSIS — M1710 Unilateral primary osteoarthritis, unspecified knee: Secondary | ICD-10-CM | POA: Diagnosis present

## 2019-09-19 DIAGNOSIS — F17210 Nicotine dependence, cigarettes, uncomplicated: Secondary | ICD-10-CM | POA: Diagnosis present

## 2019-09-19 DIAGNOSIS — E860 Dehydration: Secondary | ICD-10-CM | POA: Diagnosis not present

## 2019-09-19 DIAGNOSIS — W19XXXA Unspecified fall, initial encounter: Secondary | ICD-10-CM | POA: Diagnosis not present

## 2019-09-19 DIAGNOSIS — Z7401 Bed confinement status: Secondary | ICD-10-CM | POA: Diagnosis not present

## 2019-09-19 DIAGNOSIS — R262 Difficulty in walking, not elsewhere classified: Secondary | ICD-10-CM

## 2019-09-19 DIAGNOSIS — I739 Peripheral vascular disease, unspecified: Secondary | ICD-10-CM | POA: Diagnosis present

## 2019-09-19 DIAGNOSIS — E43 Unspecified severe protein-calorie malnutrition: Secondary | ICD-10-CM | POA: Diagnosis present

## 2019-09-19 DIAGNOSIS — R296 Repeated falls: Secondary | ICD-10-CM | POA: Diagnosis present

## 2019-09-19 DIAGNOSIS — Z87442 Personal history of urinary calculi: Secondary | ICD-10-CM | POA: Diagnosis not present

## 2019-09-19 DIAGNOSIS — N179 Acute kidney failure, unspecified: Secondary | ICD-10-CM | POA: Diagnosis not present

## 2019-09-19 DIAGNOSIS — Z961 Presence of intraocular lens: Secondary | ICD-10-CM | POA: Diagnosis present

## 2019-09-19 DIAGNOSIS — Z681 Body mass index (BMI) 19 or less, adult: Secondary | ICD-10-CM | POA: Diagnosis not present

## 2019-09-19 DIAGNOSIS — Y92009 Unspecified place in unspecified non-institutional (private) residence as the place of occurrence of the external cause: Secondary | ICD-10-CM

## 2019-09-19 DIAGNOSIS — M2550 Pain in unspecified joint: Secondary | ICD-10-CM | POA: Diagnosis not present

## 2019-09-19 DIAGNOSIS — I10 Essential (primary) hypertension: Secondary | ICD-10-CM

## 2019-09-19 DIAGNOSIS — M179 Osteoarthritis of knee, unspecified: Secondary | ICD-10-CM | POA: Diagnosis present

## 2019-09-19 DIAGNOSIS — I151 Hypertension secondary to other renal disorders: Secondary | ICD-10-CM | POA: Diagnosis present

## 2019-09-19 DIAGNOSIS — M48062 Spinal stenosis, lumbar region with neurogenic claudication: Secondary | ICD-10-CM | POA: Diagnosis not present

## 2019-09-19 DIAGNOSIS — R9082 White matter disease, unspecified: Secondary | ICD-10-CM | POA: Diagnosis not present

## 2019-09-19 DIAGNOSIS — M170 Bilateral primary osteoarthritis of knee: Secondary | ICD-10-CM | POA: Diagnosis not present

## 2019-09-19 DIAGNOSIS — M6281 Muscle weakness (generalized): Secondary | ICD-10-CM | POA: Diagnosis not present

## 2019-09-19 DIAGNOSIS — E46 Unspecified protein-calorie malnutrition: Secondary | ICD-10-CM | POA: Insufficient documentation

## 2019-09-19 DIAGNOSIS — J9601 Acute respiratory failure with hypoxia: Secondary | ICD-10-CM | POA: Diagnosis not present

## 2019-09-19 LAB — COMPREHENSIVE METABOLIC PANEL
ALT: 12 U/L (ref 0–44)
AST: 20 U/L (ref 15–41)
Albumin: 3.5 g/dL (ref 3.5–5.0)
Alkaline Phosphatase: 97 U/L (ref 38–126)
Anion gap: 11 (ref 5–15)
BUN: 31 mg/dL — ABNORMAL HIGH (ref 8–23)
CO2: 27 mmol/L (ref 22–32)
Calcium: 8.8 mg/dL — ABNORMAL LOW (ref 8.9–10.3)
Chloride: 102 mmol/L (ref 98–111)
Creatinine, Ser: 1.71 mg/dL — ABNORMAL HIGH (ref 0.61–1.24)
GFR calc Af Amer: 43 mL/min — ABNORMAL LOW (ref >60)
GFR calc non Af Amer: 38 mL/min — ABNORMAL LOW (ref >60)
Glucose, Bld: 95 mg/dL (ref 70–99)
Potassium: 3.9 mmol/L (ref 3.5–5.1)
Sodium: 140 mmol/L (ref 135–145)
Total Bilirubin: 0.8 mg/dL (ref 0.3–1.2)
Total Protein: 7 g/dL (ref 6.5–8.1)

## 2019-09-19 LAB — URINALYSIS, COMPLETE (UACMP) WITH MICROSCOPIC
Bacteria, UA: NONE SEEN
Bilirubin Urine: NEGATIVE
Glucose, UA: NEGATIVE mg/dL
Hgb urine dipstick: NEGATIVE
Ketones, ur: NEGATIVE mg/dL
Leukocytes,Ua: NEGATIVE
Nitrite: NEGATIVE
Protein, ur: NEGATIVE mg/dL
Specific Gravity, Urine: 1.018 (ref 1.005–1.030)
pH: 5 (ref 5.0–8.0)

## 2019-09-19 LAB — BASIC METABOLIC PANEL
Anion gap: 10 (ref 5–15)
BUN: 26 mg/dL — ABNORMAL HIGH (ref 8–23)
CO2: 26 mmol/L (ref 22–32)
Calcium: 8.6 mg/dL — ABNORMAL LOW (ref 8.9–10.3)
Chloride: 104 mmol/L (ref 98–111)
Creatinine, Ser: 1.44 mg/dL — ABNORMAL HIGH (ref 0.61–1.24)
GFR calc Af Amer: 54 mL/min — ABNORMAL LOW (ref >60)
GFR calc non Af Amer: 46 mL/min — ABNORMAL LOW (ref >60)
Glucose, Bld: 92 mg/dL (ref 70–99)
Potassium: 3.7 mmol/L (ref 3.5–5.1)
Sodium: 140 mmol/L (ref 135–145)

## 2019-09-19 LAB — TROPONIN I (HIGH SENSITIVITY)
Troponin I (High Sensitivity): 19 ng/L — ABNORMAL HIGH (ref <18)
Troponin I (High Sensitivity): 24 ng/L — ABNORMAL HIGH (ref <18)

## 2019-09-19 LAB — CBC
HCT: 36.6 % — ABNORMAL LOW (ref 39.0–52.0)
Hemoglobin: 11.8 g/dL — ABNORMAL LOW (ref 13.0–17.0)
MCH: 26.8 pg (ref 26.0–34.0)
MCHC: 32.2 g/dL (ref 30.0–36.0)
MCV: 83 fL (ref 80.0–100.0)
Platelets: 211 10*3/uL (ref 150–400)
RBC: 4.41 MIL/uL (ref 4.22–5.81)
RDW: 13.8 % (ref 11.5–15.5)
WBC: 10.1 10*3/uL (ref 4.0–10.5)
nRBC: 0 % (ref 0.0–0.2)

## 2019-09-19 LAB — CK: Total CK: 172 U/L (ref 49–397)

## 2019-09-19 LAB — SARS CORONAVIRUS 2 BY RT PCR (HOSPITAL ORDER, PERFORMED IN ~~LOC~~ HOSPITAL LAB): SARS Coronavirus 2: NEGATIVE

## 2019-09-19 MED ORDER — SODIUM CHLORIDE 0.9 % IV SOLN: INTRAVENOUS | Status: DC

## 2019-09-19 MED ORDER — ACETAMINOPHEN 650 MG RE SUPP: 650.0000 mg | Freq: Four times a day (QID) | RECTAL | Status: DC | PRN

## 2019-09-19 MED ORDER — ENOXAPARIN SODIUM 40 MG/0.4ML ~~LOC~~ SOLN
40.0000 mg | SUBCUTANEOUS | Status: DC
  Administered 2019-09-20 – 2019-09-22 (×3): 40 mg via SUBCUTANEOUS
  Filled 2019-09-19 (×3): qty 0.4

## 2019-09-19 MED ORDER — ACETAMINOPHEN 325 MG PO TABS: 650.0000 mg | ORAL_TABLET | Freq: Four times a day (QID) | ORAL | Status: DC | PRN

## 2019-09-19 MED ORDER — ONDANSETRON HCL 4 MG PO TABS: 4.0000 mg | ORAL_TABLET | Freq: Four times a day (QID) | ORAL | Status: DC | PRN

## 2019-09-19 MED ORDER — SODIUM CHLORIDE 0.9 % IV BOLUS
500.0000 mL | Freq: Once | INTRAVENOUS | Status: AC
  Administered 2019-09-19: 500 mL via INTRAVENOUS

## 2019-09-19 MED ORDER — ONDANSETRON HCL 4 MG/2ML IJ SOLN: 4.0000 mg | Freq: Four times a day (QID) | INTRAMUSCULAR | Status: DC | PRN

## 2019-09-19 NOTE — ED Notes (Signed)
PT in room with patient.

## 2019-09-19 NOTE — Evaluation (Signed)
Physical Therapy Evaluation Patient Details Name: [name] [name] MRN: [id number] DOB: 1940/10/01 Today's Date: 09/19/2019   History of Present Illness  [name] [name] is a 18yoM who comes to [facility] on 8/19, was seen falling in neighbors yard. PMH: hypertension, spinal stenosis with neurogenic claudication, DDD osteoarthritis.  Clinical Impression  Pt admitted with above diagnosis. Pt currently with functional limitations due to the deficits listed below (see "PT Problem List"). Upon entry, pt in bed, asleep but easily made awake, remains with flat affect and delayed mentation throughout. Pt agreeable to participate. Pt is soiled in bed, unaware, Electrical engineer for assistance with clean up time. The pt is alert and oriented self, location, month, struggles to give me the year despite multiple attempts. Pt requires modA for bed mobility and minA for transfers and AMB, mostly due to continual postural sway and LOB. Pt reports falling ~2x weekly PTA, reportedly still driving, which is extremely concerning to author given pt's delayed mentation, and still making his own groceries despite admittedly having difficulty doing this. Pt reports he really would be better off having help with groceries from his sons, unclear why they are not assisting more. Pt reports most falls occur with his SPC when getting out of the car, he is agreeable that a RW would offer more stability, but does not use it because of difficulty managing it in and out of car whilst driving. Functional mobility assessment demonstrates increased effort/time requirements, poor tolerance, and need for constant physical assistance, whereas the patient performed these at a higher level of independence PTA. Baseline level is unclear at this time, but presumably better, as it is not imaginable that patient could be safe and functioning at home right now in his present mental state. Pt will benefit from skilled PT intervention to increase  independence and safety with basic mobility in preparation for discharge to the venue listed below.       Follow Up Recommendations SNF;Supervision/Assistance - 24 hour    Equipment Recommendations  None recommended by PT    Recommendations for Other Services       Precautions / Restrictions Precautions Precautions: Fall Restrictions Weight Bearing Restrictions: No      Mobility  Bed Mobility Overal bed mobility: Needs Assistance Bed Mobility: Supine to Sit;Sit to Supine     Supine to sit: Supervision;Min guard Sit to supine: Mod assist   General bed mobility comments: quite lethargic and delayed, appears heavily intoxicated (although he is not presumed to be)  Transfers Overall transfer level: Needs assistance Equipment used: 1 person hand held assist;Straight cane Transfers: Sit to/from Stand Sit to Stand: Min assist         General transfer comment: delayed establishment of balance, requires minA for lengthy/inadequate righting attempts  Ambulation/Gait Ambulation/Gait assistance: Min assist Gait Distance (Feet): 25 Feet Assistive device: Straight cane Gait Pattern/deviations: Step-to pattern     General Gait Details: looses balance and begins to fall more often then not, poor awareness of postural sway, no capacity for correction without assist; slight improvement over time.  Stairs            Wheelchair Mobility    Modified Rankin (Stroke Patients Only)       Balance Overall balance assessment: Needs assistance Sitting-balance support: Single extremity supported;Feet supported Sitting balance-Leahy Scale: Fair     Standing balance support: Single extremity supported;During functional activity Standing balance-Leahy Scale: Zero  Pertinent Vitals/Pain      Home Living Family/patient expects to be discharged to:: Private residence Living Arrangements: Children (Lives with 2 sons, reports he is  alone most of the time.) Available Help at Discharge: Family Type of Home: House       Home Layout: One level Home Equipment: Environmental consultant - 2 wheels;Cane - single point      Prior Function Level of Independence: Needs assistance   Gait / Transfers Assistance Needed: RW for gait, often SPC as he cannot manage RW in/out of car  ADL's / Homemaking Assistance Needed: per pt still driving and grocery shopping, but admittedly struggles to manage groceries into house due to baseline balance issues        Hand Dominance        Extremity/Trunk Assessment   Upper Extremity Assessment Upper Extremity Assessment: Generalized weakness    Lower Extremity Assessment Lower Extremity Assessment: Generalized weakness       Communication      Cognition Arousal/Alertness: Lethargic Behavior During Therapy: WFL for tasks assessed/performed Overall Cognitive Status: No family/caregiver present to determine baseline cognitive functioning                                 General Comments: quite delayed, often does not complete his reponse; has to be asked 5x what the year is, but he never completes his answer. Does know that it is August and he's in hospital. Does not appear toknow how to answer his mobilie phone in session.      General Comments      Exercises     Assessment/Plan    PT Assessment Patient needs continued PT services  PT Problem List Decreased strength;Decreased cognition;Decreased range of motion;Decreased activity tolerance;Decreased balance;Decreased mobility;Decreased coordination;Decreased safety awareness;Decreased knowledge of precautions       PT Treatment Interventions DME instruction;Balance training;Gait training;Stair training;Functional mobility training;Therapeutic activities;Therapeutic exercise;Patient/family education;Cognitive remediation    PT Goals (Current goals can be found in the Care Plan section)  Acute Rehab PT Goals Patient  Stated Goal: stop falling so much, have more assistance at home. PT Goal Formulation: With patient Time For Goal Achievement: 10/03/19 Potential to Achieve Goals: Poor    Frequency Min 2X/week   Barriers to discharge Decreased caregiver support;Inaccessible home environment      Co-evaluation               AM-PAC PT "6 Clicks" Mobility  Outcome Measure Help needed turning from your back to your side while in a flat bed without using bedrails?: A Lot Help needed moving from lying on your back to sitting on the side of a flat bed without using bedrails?: A Lot Help needed moving to and from a bed to a chair (including a wheelchair)?: A Lot Help needed standing up from a chair using your arms (e.g., wheelchair or bedside chair)?: A Lot Help needed to walk in hospital room?: A Lot Help needed climbing 3-5 steps with a railing? : A Lot 6 Click Score: 12    End of Session Equipment Utilized During Treatment: Gait belt Activity Tolerance: Patient limited by fatigue;Patient limited by lethargy Patient left: in bed;with call bell/phone within reach;Other (comment) (meal presented, pt on phone) Nurse Communication: Mobility status PT Visit Diagnosis: Unsteadiness on feet (R26.81);Difficulty in walking, not elsewhere classified (R26.2);Other abnormalities of gait and mobility (R26.89);Other symptoms and signs involving the nervous system (Y77.412)    Time: 1445-1520 PT  Time Calculation (min) (ACUTE ONLY): 35 min   Charges:   PT Evaluation $PT Eval Moderate Complexity: 1 Mod PT Treatments $Neuromuscular Re-education: 8-22 mins        5:07 PM, 09/19/19 Etta Grandchild, PT, DPT Physical Therapist - [facility]  [phone] ([place])    [place] C 09/19/2019, 5:01 PM

## 2019-09-19 NOTE — ED Notes (Signed)
Pt transported to CT ?

## 2019-09-19 NOTE — H&P (Signed)
History and Physical    [name] [name] AYT:[id number] DOB: 24-Nov-1940 DOA: 09/19/2019  PCP: [name], [name], MD   Patient coming from: home  I have personally briefly reviewed patient's old medical records in [place]  Chief Complaint: Fall  HPI: [name] [name] is a 79 y.o. male with medical history significant for hypertension, spinal stenosis with neurogenic claudication, DDD osteoarthritis, ambulant with a cane who lives alone who presents to the emergency room after he was seen to have fallen in his neighbor's yard.  EMS was called and he was found lying on his back smoking a cigarette,.  Patient did ambulate at the scene.  He denied preceding symptoms such as lightheadedness, one-sided weakness, visual disturbance, chest pain, shortness of breath palpitations but kept repeating that if he walked backwards he would fall. ED Course: On arrival he was awake with normal vitals.  Blood work significant for creatinine of 1.71, above baseline of 0.99.  Troponin was 19, CK 172.  CT head with no acute intracranial abnormalities.  EKG with normal sinus rhythm and few PVCs.  Patient was started on IV hydration.  Hospitalist consulted for admission due to unsafe discharge option.  Review of Systems: Limited due to lethargy   Past Medical History:  Diagnosis Date  . Back pain   . Renal disorder    kidney stones  . Spinal stenosis, lumbar region, with neurogenic claudication 07/07/2014    Past Surgical History:  Procedure Laterality Date  . CATARACT EXTRACTION W/PHACO Left 08/22/2016   Procedure: CATARACT EXTRACTION PHACO AND INTRAOCULAR LENS PLACEMENT ([place]) Left;  Surgeon: [name], MD;  Location: [facility];  Service: Ophthalmology;  Laterality: Left;  . CATARACT EXTRACTION W/PHACO Right 10/25/2016   Procedure: CATARACT EXTRACTION PHACO AND INTRAOCULAR LENS PLACEMENT ([place]) WITH ISTENT RIGHT  ;  Surgeon: [name], MD;  Location: [facility];  Service: Ophthalmology;  Laterality: Right;  TOPICAL RIGHT  ISTENT   trabecular bypass stent was implanted/explanted.  . INGUINAL HERNIA REPAIR    . KNEE SURGERY Right   . NECK SURGERY       reports that he has been smoking cigarettes. He has been smoking about 0.75 packs per day. He has never used smokeless tobacco. He reports that he does not drink alcohol and does not use drugs.  No Known Allergies  Family History  Problem Relation Age of Onset  . Diabetes Mother   . Diabetes Brother   . Diabetes Sister       Prior to Admission medications   Medication Sig Start Date End Date Taking? Authorizing Provider  DULoxetine (CYMBALTA) 60 MG capsule Take 1 capsule (60 mg total) by mouth daily. 06/07/19   [name], [name], MD  hydrochlorothiazide (HYDRODIURIL) 12.5 MG tablet Take 1 tablet (12.5 mg total) by mouth daily. 07/16/19   [name], [name], MD  losartan (COZAAR) 50 MG tablet Take 1 tablet (50 mg total) by mouth daily. 03/26/19   [name], [name], MD  Oxycodone HCl 10 MG TABS Take 1 tablet (10 mg total) by mouth 2 (two) times daily as needed. 07/18/19   [name], [name], MD    Physical Exam: Vitals:   09/19/19 0019 09/19/19 0023 09/19/19 0028  BP:  122/85   Pulse:  66   Resp:  14   Temp:  98.3 F (36.8 C)   TempSrc:  Oral   SpO2:   100%  Weight: 59 kg    Height: 5\' 10"  (1.778  m)       Vitals:   09/19/19 0019 09/19/19 0023 09/19/19 0028  BP:  122/85   Pulse:  66   Resp:  14   Temp:  98.3 F (36.8 C)   TempSrc:  Oral   SpO2:   100%  Weight: 59 kg    Height: 5\' 10"  (1.778 m)        Constitutional:  Lethargic and oriented to person and place. Not in any apparent distress HEENT:      Head: Normocephalic and atraumatic.         Eyes: PERLA, EOMI, Conjunctivae are normal. Sclera is non-icteric.       Mouth/Throat: Mucous membranes are moist.       Neck: Supple with no signs of meningismus. Cardiovascular: Regular rate and rhythm. No murmurs, gallops, or rubs.  2+ symmetrical distal pulses are present . No JVD. No LE edema Respiratory: Respiratory effort normal .Lungs sounds clear bilaterally. No wheezes, crackles, or rhonchi.  Gastrointestinal: Soft, non tender, and non distended with positive bowel sounds. No rebound or guarding. Genitourinary: No CVA tenderness. Musculoskeletal: Nontender with normal range of motion in all extremities. No cyanosis, or erythema of extremities. Neurologic: Normal speech and language. Face is symmetric. Moving all extremities. No gross focal neurologic deficits . Skin: Skin is warm, dry.  No rash or ulcers Psychiatric: Mood and affect are normal Speech and behavior are normal   Labs on Admission: I have personally reviewed following labs and imaging studies  CBC: Recent Labs  Lab 09/19/19 0021  WBC 10.1  HGB 11.8*  HCT 36.6*  MCV 83.0  PLT 749   Basic Metabolic Panel: Recent Labs  Lab 09/19/19 0021  NA 140  K 3.9  CL 102  CO2 27  GLUCOSE 95  BUN 31*  CREATININE 1.71*  CALCIUM 8.8*   GFR: Estimated Creatinine Clearance: 29.7 mL/min (A) (by C-[name] formula based on SCr of 1.71 mg/dL (H)). Liver Function Tests: Recent Labs  Lab 09/19/19 0021  AST 20  ALT 12  ALKPHOS 97  BILITOT 0.8  PROT 7.0  ALBUMIN 3.5   No results for input(s): LIPASE, AMYLASE in the last 168 hours. No results for input(s): AMMONIA in the last 168 hours. Coagulation Profile: No results for input(s): INR, PROTIME in the last 168 hours. Cardiac Enzymes: Recent Labs  Lab 09/19/19 0021  CKTOTAL 172   BNP (last 3 results) No results for input(s): PROBNP in the last 8760 hours. HbA1C: No results for input(s): HGBA1C in the last 72 hours. CBG: No results for input(s): GLUCAP in the last 168 hours. Lipid Profile: No results for input(s): CHOL, HDL, LDLCALC, TRIG, CHOLHDL, LDLDIRECT in the last 72 hours. Thyroid Function Tests: No results for input(s): TSH, T4TOTAL, FREET4, T3FREE, THYROIDAB in the last 72  hours. Anemia Panel: No results for input(s): VITAMINB12, FOLATE, FERRITIN, TIBC, IRON, RETICCTPCT in the last 72 hours. Urine analysis:    Component Value Date/Time   COLORURINE YELLOW 06/13/2018 1208   APPEARANCEUR HAZY (A) 06/13/2018 1208   APPEARANCEUR Cloudy 01/02/2012 1326   LABSPEC 1.017 06/13/2018 1208   LABSPEC 1.017 01/02/2012 1326   PHURINE 6.0 06/13/2018 1208   GLUCOSEU NEGATIVE 06/13/2018 [place] 09/26/2017 1104   [place] 06/13/2018 [place] 06/13/2018 1208   BILIRUBINUR Negative 01/02/2012 [place] 06/13/2018 1208   PROTEINUR 30 (A) 06/13/2018 1208   UROBILINOGEN 0.2 09/26/2017 1104   NITRITE NEGATIVE 06/13/2018 1208  LEUKOCYTESUR MODERATE (A) 06/13/2018 1208   LEUKOCYTESUR 3+ 01/02/2012 1326    Radiological Exams on Admission: CT Head Wo Contrast  Result Date: 09/19/2019 CLINICAL DATA:  Head trauma EXAM: CT HEAD WITHOUT CONTRAST TECHNIQUE: Contiguous axial images were obtained from the base of the skull through the vertex without intravenous contrast. COMPARISON:  None. FINDINGS: Brain: There is no mass, hemorrhage or extra-axial collection. The size and configuration of the ventricles and extra-axial CSF spaces are normal. There is hypoattenuation of the white matter, most commonly indicating chronic small vessel disease. Vascular: No abnormal hyperdensity of the major intracranial arteries or dural venous sinuses. No intracranial atherosclerosis. Skull: The visualized skull base, calvarium and extracranial soft tissues are normal. Sinuses/Orbits: No fluid levels or advanced mucosal thickening of the visualized paranasal sinuses. No mastoid or middle ear effusion. The orbits are normal. IMPRESSION: 1. No acute intracranial abnormality. 2. Chronic small vessel disease. Electronically Signed   By: Ulyses Jarred M.D.   On: 09/19/2019 03:22    EKG: Independently reviewed. Interpretation : Normal sinus rhythm with  PVCs  Assessment/Plan 79 year old male with history of hypertension, spinal stenosis with neurogenic claudication, DDD osteoarthritis, ambulant with a cane who lives alone presenting following a fall in neighbor's yard.  Work-up significant for AKI.     AKI (acute kidney injury) ([place])   Dehydration -AKI likely prerenal.  Patient noted to be on thiazide diuretics which could be the reason -IV hydration and monitor renal function -Hold diuretics for now    Ambulatory dysfunction   Fall at home, initial encounter   DJD (degenerative joint disease) of knee Spinal stenosis with neurogenic claudication -At baseline patient ambulates with cane -Head CT negative for acute injury, CK normal -Hold diuretics, hold oxycodone which may have contributed to impaired balance -Physical therapy evaluation    Hypertension, essential, benign -Continue nondiuretic antihypertensives pending med rec    DVT prophylaxis: Lovenox  Code Status: full code  Family Communication:  none  Disposition Plan: Back to previous home environment Consults called: none  Status: Observation    Athena Masse MD [facility]     09/19/2019, 3:54 AM

## 2019-09-19 NOTE — ED Notes (Signed)
Pt laying on bed. Pt has cardaic, bp and pulse ox monitor on. Pt noted to have BM. Pt cleaned, new gown placed. Pt on male urinary suction catheter. Pt has personal cell phone and was talking to someone while in room.

## 2019-09-19 NOTE — ED Triage Notes (Signed)
Pt comes [facility]. Pt found in driveway of stranger laying on back smoking. Pt states "I can't go backwards or I fall". Pt comes in holding cane. Pt has edema bilateral feet. Takes losartan and oxycodone per friend.  Able to answer some ems questions, states pee is apple juice color. When asked, said he wants to see doctor after initially refusing and sitting down inside.

## 2019-09-19 NOTE — ED Provider Notes (Signed)
[facility] Emergency Department Provider Note  ____________________________________________   First MD Initiated Contact with Patient 09/19/19 0025     (approximate)  I have reviewed the triage vital signs and the nursing notes.   HISTORY  Chief Complaint Fall    HPI [name] [name] is a 79 y.o. male with below list of previous medical conditions including frequent falls, renal disorder hypertension peripheral artery disease spinal stenosis presents to the emergency department via EMS after an unwitnessed fall.  EMS states that someone notify them that they witnessed somebody fall in her driveway.  On arrival patient was found laying on the floor smoking a cigarette.   Patient denies any complaints at present.  Patient denies any preceding symptoms no chest pain shortness of breath headache nausea or vomiting.  Patient states "I can go backwards or else I fall".        Past Medical History:  Diagnosis Date  . Back pain   . Renal disorder    kidney stones  . Spinal stenosis, lumbar region, with neurogenic claudication 07/07/2014    Patient Active Problem List   Diagnosis Date Noted  . AKI (acute kidney injury) ([place]) 09/19/2019  . Ambulatory dysfunction 09/19/2019  . Dehydration 09/19/2019  . Fall at home, initial encounter 09/19/2019  . Aortic atherosclerosis ([place]) 02/16/2019  . Hypertension, essential, benign 08/13/2018  . Urinary frequency 02/05/2018  . PAD (peripheral artery disease) (HCC)-Mild 12/26/2017  . Chronic pain disorder 11/17/2017  . Unstable gait 11/17/2017  . BPH associated with nocturia 09/26/2017  . Allergic rhinitis 06/09/2017  . Tobacco use disorder 02/14/2017  . Generalized osteoarthritis of multiple sites 02/14/2017  . Bilateral lower extremity edema 08/12/2016  . Chronic right-sided low back pain with right-sided sciatica 03/14/2016  . Chronic pain of right knee 03/14/2016  . DDD (degenerative disc disease),  lumbar 07/07/2014  . DDD (degenerative disc disease), cervical 07/07/2014  . DJD (degenerative joint disease) of knee 07/07/2014  . Bilateral occipital neuralgia 07/07/2014  . Spinal stenosis, lumbar region, with neurogenic claudication 07/07/2014    Past Surgical History:  Procedure Laterality Date  . CATARACT EXTRACTION W/PHACO Left 08/22/2016   Procedure: CATARACT EXTRACTION PHACO AND INTRAOCULAR LENS PLACEMENT ([place]) Left;  Surgeon: [name], MD;  Location: [place];  Service: Ophthalmology;  Laterality: Left;  . CATARACT EXTRACTION W/PHACO Right 10/25/2016   Procedure: CATARACT EXTRACTION PHACO AND INTRAOCULAR LENS PLACEMENT ([place]) WITH ISTENT RIGHT  ;  Surgeon: [name], MD;  Location: [place];  Service: Ophthalmology;  Laterality: Right;  TOPICAL RIGHT  ISTENT   trabecular bypass stent was implanted/explanted.  . INGUINAL HERNIA REPAIR    . KNEE SURGERY Right   . NECK SURGERY      Prior to Admission medications   Medication Sig Start Date End Date Taking? Authorizing Provider  DULoxetine (CYMBALTA) 60 MG capsule Take 1 capsule (60 mg total) by mouth daily. 06/07/19  Yes [name], [name], MD  hydrochlorothiazide (HYDRODIURIL) 12.5 MG tablet Take 1 tablet (12.5 mg total) by mouth daily. 07/16/19  Yes [name], [name], MD  losartan (COZAAR) 50 MG tablet Take 1 tablet (50 mg total) by mouth daily. 03/26/19  Yes [name], [name], MD  Oxycodone HCl 10 MG TABS Take 1 tablet (10 mg total) by mouth 2 (two) times daily as needed. 07/18/19  Yes [name], [name], MD    Allergies Patient has no known allergies.  Family History  Problem Relation Age of Onset  .  Diabetes Mother   . Diabetes Brother   . Diabetes Sister     Social History Social History   Tobacco Use  . Smoking status: Current Every Day Smoker    Packs/day: 0.75    Types: Cigarettes  . Smokeless tobacco: Never Used  Vaping Use  . Vaping Use: Never used  Substance Use Topics  .  Alcohol use: No  . Drug use: Never    Review of Systems Constitutional: No fever/chills Eyes: No visual changes. ENT: No sore throat. Cardiovascular: Denies chest pain. Respiratory: Denies shortness of breath. Gastrointestinal: No abdominal pain.  No nausea, no vomiting.  No diarrhea.  No constipation. Genitourinary: Negative for dysuria. Musculoskeletal: Negative for neck pain.  Negative for back pain. Integumentary: Negative for rash. Neurological: Negative for headaches, focal weakness or numbness.  ____________________________________________   PHYSICAL EXAM:  VITAL SIGNS: ED Triage Vitals  Enc Vitals Group     BP 09/19/19 0023 122/85     Pulse Rate 09/19/19 0023 66     Resp 09/19/19 0023 14     Temp 09/19/19 0023 98.3 F (36.8 C)     Temp Source 09/19/19 0023 Oral     SpO2 09/19/19 0028 100 %     Weight 09/19/19 0019 59 kg (130 lb)     Height 09/19/19 0019 1.778 m (5\' 10" )     Head Circumference --      Peak Flow --      Pain Score 09/19/19 0019 0     Pain Loc --      Pain Edu? --      Excl. in [place]? --     Constitutional: Alert and oriented.  Eyes: Conjunctivae are normal.  Head: Atraumatic. Mouth/Throat: Patient is wearing a mask. Neck: No stridor.  No meningeal signs.   Cardiovascular: Normal rate, regular rhythm. Good peripheral circulation. Grossly normal heart sounds. Respiratory: Normal respiratory effort.  No retractions. Gastrointestinal: Soft and nontender. No distention.  Musculoskeletal: No lower extremity tenderness nor edema. No gross deformities of extremities. Neurologic:  Normal speech and language. No gross focal neurologic deficits are appreciated.  Skin:  Skin is warm, dry and intact. Psychiatric: Mood and affect are normal. Speech and behavior are normal.  ____________________________________________   LABS (all labs ordered are listed, but only abnormal results are displayed)  Labs Reviewed  CBC - Abnormal; Notable for the following  components:      Result Value   Hemoglobin 11.8 (*)    HCT 36.6 (*)    All other components within normal limits  COMPREHENSIVE METABOLIC PANEL - Abnormal; Notable for the following components:   BUN 31 (*)    Creatinine, Ser 1.71 (*)    Calcium 8.8 (*)    GFR calc non Af Amer 38 (*)    GFR calc Af Amer 43 (*)    All other components within normal limits  URINALYSIS, COMPLETE (UACMP) WITH MICROSCOPIC - Abnormal; Notable for the following components:   Color, Urine YELLOW (*)    APPearance CLEAR (*)    All other components within normal limits  TROPONIN I (HIGH SENSITIVITY) - Abnormal; Notable for the following components:   Troponin I (High Sensitivity) 19 (*)    All other components within normal limits  CK  BASIC METABOLIC PANEL  TROPONIN I (HIGH SENSITIVITY)   ____________________________________________  EKG  ED ECG REPORT I, [place] N Finnick Orosz, the attending physician, personally viewed and interpreted this ECG.   Date: 09/19/2019  EKG Time: 12:22 AM  Rate: 64  Rhythm: Sinus rhythm premature ventricular contractions.  Axis: Normal  Intervals:  ST&T Change: None  ____________________________________________  RADIOLOGY I, [place] N Kelita Wallis, personally viewed and evaluated these images (plain radiographs) as part of my medical decision making, as well as reviewing the written report by the radiologist.  ED MD interpretation: No acute intracranial abnormality noted on CT head per radiologist.  Official radiology report(s): CT Head Wo Contrast  Result Date: 09/19/2019 CLINICAL DATA:  Head trauma EXAM: CT HEAD WITHOUT CONTRAST TECHNIQUE: Contiguous axial images were obtained from the base of the skull through the vertex without intravenous contrast. COMPARISON:  None. FINDINGS: Brain: There is no mass, hemorrhage or extra-axial collection. The size and configuration of the ventricles and extra-axial CSF spaces are normal. There is hypoattenuation of the white matter, most  commonly indicating chronic small vessel disease. Vascular: No abnormal hyperdensity of the major intracranial arteries or dural venous sinuses. No intracranial atherosclerosis. Skull: The visualized skull base, calvarium and extracranial soft tissues are normal. Sinuses/Orbits: No fluid levels or advanced mucosal thickening of the visualized paranasal sinuses. No mastoid or middle ear effusion. The orbits are normal. IMPRESSION: 1. No acute intracranial abnormality. 2. Chronic small vessel disease. Electronically Signed   By: Ulyses Jarred M.D.   On: 09/19/2019 03:22      Procedures   ____________________________________________   INITIAL IMPRESSION / MDM / [place] / ED COURSE  As part of my medical decision making, I reviewed the following data within the [place] NUMBER   79 year old male presenting with above-stated history and physical exam differential diagnosis including but not limited to syncope, mechanical fall, traumatic injury secondary to fall including possible intracranial injury.  CT scan of the head revealed no acute intracranial abnormality.  Laboratory data revealed a creatinine of 1.71 which is up from 0.9 in comparison to previous April of this year.  Reviewed the patient's chart revealed the patient's had multiple falls.  Patient discussed with hospitalist for hospital admission further evaluation and management.  ____________________________________________  FINAL CLINICAL IMPRESSION(S) / ED DIAGNOSES  Final diagnoses:  AKI (acute kidney injury) ([place])     MEDICATIONS GIVEN DURING THIS VISIT:  Medications  enoxaparin (LOVENOX) injection 40 mg (has no administration in time range)  0.9 %  sodium chloride infusion (has no administration in time range)  acetaminophen (TYLENOL) tablet 650 mg (has no administration in time range)    Or  acetaminophen (TYLENOL) suppository 650 mg (has no administration in time range)  ondansetron (ZOFRAN)  tablet 4 mg (has no administration in time range)    Or  ondansetron (ZOFRAN) injection 4 mg (has no administration in time range)  sodium chloride 0.9 % bolus 500 mL (500 mLs Intravenous New Bag/Given 09/19/19 0402)     ED Discharge Orders    None      *Please note:  Lory Galan was evaluated in Emergency Department on 09/19/2019 for the symptoms described in the history of present illness. He was evaluated in the context of the global COVID-19 pandemic, which necessitated consideration that the patient might be at risk for infection with the SARS-CoV-2 virus that causes COVID-19. Institutional protocols and algorithms that pertain to the evaluation of patients at risk for COVID-19 are in a state of rapid change based on information released by regulatory bodies including the CDC and federal and state organizations. These policies and algorithms were followed during the patient's care in the ED.  Some ED evaluations and interventions may  be delayed as a result of limited staffing during and after the pandemic.*  Note:  This document was prepared using Dragon voice recognition software and may include unintentional dictation errors.   Gregor Hams, MD 09/22/19 0200

## 2019-09-19 NOTE — ED Notes (Signed)
Pt raised up in bed and adjusted to eat meal tray at this time.

## 2019-09-19 NOTE — Progress Notes (Signed)
PROGRESS NOTE    [name] [name]  FGH:[id number] DOB: [birth year] DOA: 09/19/2019 PCP: [name], [name], MD    Assessment & Plan:   Principal Problem:   AKI (acute kidney injury) [facility]) Active Problems:   DJD (degenerative joint disease) of knee   Hypertension, essential, benign   Ambulatory dysfunction   Dehydration   Fall at home, initial encounter    [name] [name] is a 79 y.o. male with medical history significant for hypertension, spinal stenosis with neurogenic claudication, DDD osteoarthritis, ambulant with a cane who lives alone who presents to the emergency room after he was seen to have fallen in his neighbor's yard.  EMS was called and he was found lying on his back smoking a cigarette,.  Patient did ambulate at the scene.     AKI (acute kidney injury) ([place])   Dehydration -AKI likely prerenal.  Patient noted to be on thiazide diuretics which could be the reason -continue MIVF --Hold home HCTZ and losartan    Ambulatory dysfunction   Fall at home, initial encounter   DJD (degenerative joint disease) of knee Spinal stenosis with neurogenic claudication -At baseline patient ambulates with cane -Head CT negative for acute injury, CK normal PLAN: -hold oxycodone which may have contributed to impaired balance -PT rec SNF rehab    Hypertension, essential, benign --Hold home HCTZ and losartan 2/2 AKI    DVT prophylaxis: Lovenox SQ Code Status: Full code  Family Communication:  Status is: observation Dispo:   The patient is from: home Anticipated d/c is to: SNF rehab Anticipated d/c date is: whenever bed available Patient currently is medically stable to d/c.   Subjective and Interval History:  Pt had no complaints.  Nursing noted pt to be lethargic.  PT eval rec SNF rehab.  Pt agreeable.   Objective: Vitals:   09/19/19 2030 09/19/19 2128 09/20/19 0130 09/20/19 0337  BP: 110/63 126/67 134/76 (!) 155/77  Pulse: (!) 55 (!) 50 (!) 44 (!) 55   Resp: 13 17 16 16   Temp:  98.5 F (36.9 C) 97.6 F (36.4 C) 97.7 F (36.5 C)  TempSrc:  Oral Oral Oral  SpO2: 100% 98% 100% 100%  Weight:      Height:        Intake/Output Summary (Last 24 hours) at 09/20/2019 0511 Last data filed at 09/20/2019 0216 Gross per 24 hour  Intake 1634.47 ml  Output 650 ml  Net 984.47 ml   Filed Weights   09/19/19 0019  Weight: 59 kg    Examination:   Constitutional: NAD, AAOx3, sitting up eating HEENT: conjunctivae and lids normal, EOMI CV:  No cyanosis.   RESP: normal respiratory effort  Neuro: II - XII grossly intact.   Psych: Normal mood and affect.     Data Reviewed: I have personally reviewed following labs and imaging studies  CBC: Recent Labs  Lab 09/19/19 0021  WBC 10.1  HGB 11.8*  HCT 36.6*  MCV 83.0  PLT 678   Basic Metabolic Panel: Recent Labs  Lab 09/19/19 0021 09/19/19 0829  NA 140 140  K 3.9 3.7  CL 102 104  CO2 27 26  GLUCOSE 95 92  BUN 31* 26*  CREATININE 1.71* 1.44*  CALCIUM 8.8* 8.6*   GFR: Estimated Creatinine Clearance: 35.3 mL/min (A) (by C-G formula based on SCr of 1.44 mg/dL (H)). Liver Function Tests: Recent Labs  Lab 09/19/19 0021  AST 20  ALT 12  ALKPHOS 97  BILITOT 0.8  PROT 7.0  ALBUMIN 3.5   No results for input(s): LIPASE, AMYLASE in the last 168 hours. No results for input(s): AMMONIA in the last 168 hours. Coagulation Profile: No results for input(s): INR, PROTIME in the last 168 hours. Cardiac Enzymes: Recent Labs  Lab 09/19/19 0021  CKTOTAL 172   BNP (last 3 results) No results for input(s): PROBNP in the last 8760 hours. HbA1C: No results for input(s): HGBA1C in the last 72 hours. CBG: No results for input(s): GLUCAP in the last 168 hours. Lipid Profile: No results for input(s): CHOL, HDL, LDLCALC, TRIG, CHOLHDL, LDLDIRECT in the last 72 hours. Thyroid Function Tests: No results for input(s): TSH, T4TOTAL, FREET4, T3FREE, THYROIDAB in the last 72 hours. Anemia  Panel: No results for input(s): VITAMINB12, FOLATE, FERRITIN, TIBC, IRON, RETICCTPCT in the last 72 hours. Sepsis Labs: No results for input(s): PROCALCITON, LATICACIDVEN in the last 168 hours.  Recent Results (from the past 240 hour(s))  SARS Coronavirus 2 by RT PCR (hospital order, performed in [facility] hospital lab) Nasopharyngeal Nasopharyngeal Swab     Status: None   Collection Time: 09/19/19  8:15 PM   Specimen: Nasopharyngeal Swab  Result Value Ref Range Status   SARS Coronavirus 2 NEGATIVE NEGATIVE Final    Comment: (NOTE) SARS-CoV-2 target nucleic acids are NOT DETECTED.  The SARS-CoV-2 RNA is generally detectable in upper and lower respiratory specimens during the acute phase of infection. The lowest concentration of SARS-CoV-2 viral copies this assay can detect is 250 copies / mL. A negative result does not preclude SARS-CoV-2 infection and should not be used as the sole basis for treatment or other patient management decisions.  A negative result may occur with improper specimen collection / handling, submission of specimen other than nasopharyngeal swab, presence of viral mutation(s) within the areas targeted by this assay, and inadequate number of viral copies (<250 copies / mL). A negative result must be combined with clinical observations, patient history, and epidemiological information.  Fact Sheet for Patients:   StrictlyIdeas.no  Fact Sheet for Healthcare Providers: BankingDealers.co.za  This test is not yet approved or  cleared by the [place] FDA and has been authorized for detection and/or diagnosis of SARS-CoV-2 by FDA under an Emergency Use Authorization (EUA).  This EUA will remain in effect (meaning this test can be used) for the duration of the COVID-19 declaration under Section 564(b)(1) of the Act, 21 U.S.C. section 360bbb-3(b)(1), unless the authorization is terminated or revoked  sooner.  Performed at [facility], [address] [place], [place] [address]       Radiology Studies: CT Head Wo Contrast  Result Date: 09/19/2019 CLINICAL DATA:  Head trauma EXAM: CT HEAD WITHOUT CONTRAST TECHNIQUE: Contiguous axial images were obtained from the base of the skull through the vertex without intravenous contrast. COMPARISON:  None. FINDINGS: Brain: There is no mass, hemorrhage or extra-axial collection. The size and configuration of the ventricles and extra-axial CSF spaces are normal. There is hypoattenuation of the white matter, most commonly indicating chronic small vessel disease. Vascular: No abnormal hyperdensity of the major intracranial arteries or dural venous sinuses. No intracranial atherosclerosis. Skull: The visualized skull base, calvarium and extracranial soft tissues are normal. Sinuses/Orbits: No fluid levels or advanced mucosal thickening of the visualized paranasal sinuses. No mastoid or middle ear effusion. The orbits are normal. IMPRESSION: 1. No acute intracranial abnormality. 2. Chronic small vessel disease. Electronically Signed   By: Ulyses Jarred M.D.   On: 09/19/2019 03:22  Scheduled Meds: . enoxaparin (LOVENOX) injection  40 mg Subcutaneous Q24H   Continuous Infusions: . sodium chloride 100 mL/hr at 09/20/19 0216     LOS: 0 days     [name], MD [facility] If 7PM-7AM, please contact night-coverage 09/20/2019, 5:11 AM

## 2019-09-19 NOTE — ED Notes (Signed)
Pt taken to floor with RN on monitor via stretcher. Pt had pt belongings with them, in a bag

## 2019-09-20 DIAGNOSIS — Z9842 Cataract extraction status, left eye: Secondary | ICD-10-CM | POA: Diagnosis not present

## 2019-09-20 DIAGNOSIS — N179 Acute kidney failure, unspecified: Secondary | ICD-10-CM | POA: Diagnosis present

## 2019-09-20 DIAGNOSIS — Z87442 Personal history of urinary calculi: Secondary | ICD-10-CM | POA: Diagnosis not present

## 2019-09-20 DIAGNOSIS — W19XXXA Unspecified fall, initial encounter: Secondary | ICD-10-CM | POA: Diagnosis present

## 2019-09-20 DIAGNOSIS — M1710 Unilateral primary osteoarthritis, unspecified knee: Secondary | ICD-10-CM | POA: Diagnosis present

## 2019-09-20 DIAGNOSIS — E43 Unspecified severe protein-calorie malnutrition: Secondary | ICD-10-CM | POA: Diagnosis present

## 2019-09-20 DIAGNOSIS — Z20822 Contact with and (suspected) exposure to covid-19: Secondary | ICD-10-CM | POA: Diagnosis present

## 2019-09-20 DIAGNOSIS — Z961 Presence of intraocular lens: Secondary | ICD-10-CM | POA: Diagnosis present

## 2019-09-20 DIAGNOSIS — I151 Hypertension secondary to other renal disorders: Secondary | ICD-10-CM | POA: Diagnosis present

## 2019-09-20 DIAGNOSIS — E46 Unspecified protein-calorie malnutrition: Secondary | ICD-10-CM | POA: Insufficient documentation

## 2019-09-20 DIAGNOSIS — R531 Weakness: Secondary | ICD-10-CM

## 2019-09-20 DIAGNOSIS — N400 Enlarged prostate without lower urinary tract symptoms: Secondary | ICD-10-CM | POA: Diagnosis present

## 2019-09-20 DIAGNOSIS — F17210 Nicotine dependence, cigarettes, uncomplicated: Secondary | ICD-10-CM | POA: Diagnosis present

## 2019-09-20 DIAGNOSIS — Z9841 Cataract extraction status, right eye: Secondary | ICD-10-CM | POA: Diagnosis not present

## 2019-09-20 DIAGNOSIS — R296 Repeated falls: Secondary | ICD-10-CM | POA: Diagnosis present

## 2019-09-20 DIAGNOSIS — Z681 Body mass index (BMI) 19 or less, adult: Secondary | ICD-10-CM | POA: Diagnosis not present

## 2019-09-20 DIAGNOSIS — E860 Dehydration: Secondary | ICD-10-CM | POA: Diagnosis present

## 2019-09-20 DIAGNOSIS — I739 Peripheral vascular disease, unspecified: Secondary | ICD-10-CM | POA: Diagnosis present

## 2019-09-20 DIAGNOSIS — Z79899 Other long term (current) drug therapy: Secondary | ICD-10-CM | POA: Diagnosis not present

## 2019-09-20 DIAGNOSIS — M4800 Spinal stenosis, site unspecified: Secondary | ICD-10-CM | POA: Diagnosis present

## 2019-09-20 LAB — BASIC METABOLIC PANEL
Anion gap: 8 (ref 5–15)
BUN: 26 mg/dL — ABNORMAL HIGH (ref 8–23)
CO2: 24 mmol/L (ref 22–32)
Calcium: 8.1 mg/dL — ABNORMAL LOW (ref 8.9–10.3)
Chloride: 104 mmol/L (ref 98–111)
Creatinine, Ser: 1.39 mg/dL — ABNORMAL HIGH (ref 0.61–1.24)
GFR calc Af Amer: 56 mL/min — ABNORMAL LOW (ref >60)
GFR calc non Af Amer: 48 mL/min — ABNORMAL LOW (ref >60)
Glucose, Bld: 114 mg/dL — ABNORMAL HIGH (ref 70–99)
Potassium: 3.8 mmol/L (ref 3.5–5.1)
Sodium: 136 mmol/L (ref 135–145)

## 2019-09-20 LAB — CBC
HCT: 37.3 % — ABNORMAL LOW (ref 39.0–52.0)
Hemoglobin: 12.2 g/dL — ABNORMAL LOW (ref 13.0–17.0)
MCH: 26.7 pg (ref 26.0–34.0)
MCHC: 32.7 g/dL (ref 30.0–36.0)
MCV: 81.6 fL (ref 80.0–100.0)
Platelets: 203 10*3/uL (ref 150–400)
RBC: 4.57 MIL/uL (ref 4.22–5.81)
RDW: 14 % (ref 11.5–15.5)
WBC: 8.8 10*3/uL (ref 4.0–10.5)
nRBC: 0 % (ref 0.0–0.2)

## 2019-09-20 LAB — MAGNESIUM: Magnesium: 1.7 mg/dL (ref 1.7–2.4)

## 2019-09-20 MED ORDER — NICOTINE 21 MG/24HR TD PT24
21.0000 mg | MEDICATED_PATCH | Freq: Every day | TRANSDERMAL | Status: DC
  Administered 2019-09-20 – 2019-09-22 (×3): 21 mg via TRANSDERMAL
  Filled 2019-09-20 (×3): qty 1

## 2019-09-20 MED ORDER — DULOXETINE HCL 30 MG PO CPEP
60.0000 mg | ORAL_CAPSULE | Freq: Every day | ORAL | Status: DC
  Administered 2019-09-20 – 2019-09-22 (×3): 60 mg via ORAL
  Filled 2019-09-20 (×3): qty 2

## 2019-09-20 NOTE — NC FL2 (Signed)
[place] LEVEL OF CARE SCREENING TOOL     IDENTIFICATION  Patient Name: [name] [name] Birthdate: July 28, 1940 Sex: male Admission Date (Current Location): 09/19/2019  [place] and [place] Number:  Engineering geologist and Address:  [facility], [address] [place], [place] [address]      Provider Number: [id number]  Attending Physician Name and Address:  [name], MD  Relative Name and Phone Number:  Gerod Caligiuri  [phone]    Current Level of Care: Hospital Recommended Level of Care: [place] Prior Approval Number:    Date Approved/Denied:   PASRR Number: [id number]  Discharge Plan: SNF    Current Diagnoses: Patient Active Problem List   Diagnosis Date Noted  . Weakness 09/20/2019  . AKI (acute kidney injury) ([place]) 09/19/2019  . Ambulatory dysfunction 09/19/2019  . Dehydration 09/19/2019  . Fall at home, initial encounter 09/19/2019  . Aortic atherosclerosis ([place]) 02/16/2019  . Hypertension, essential, benign 08/13/2018  . Urinary frequency 02/05/2018  . PAD (peripheral artery disease) (HCC)-Mild 12/26/2017  . Chronic pain disorder 11/17/2017  . Unstable gait 11/17/2017  . BPH associated with nocturia 09/26/2017  . Allergic rhinitis 06/09/2017  . Tobacco use disorder 02/14/2017  . Generalized osteoarthritis of multiple sites 02/14/2017  . Bilateral lower extremity edema 08/12/2016  . Chronic right-sided low back pain with right-sided sciatica 03/14/2016  . Chronic pain of right knee 03/14/2016  . DDD (degenerative disc disease), lumbar 07/07/2014  . DDD (degenerative disc disease), cervical 07/07/2014  . DJD (degenerative joint disease) of knee 07/07/2014  . Bilateral occipital neuralgia 07/07/2014  . Spinal stenosis, lumbar region, with neurogenic claudication 07/07/2014    Orientation RESPIRATION BLADDER Height & Weight     Self, Time, Situation, Place  Normal Continent Weight: 59  kg Height:  5\' 10"  (177.8 cm)  BEHAVIORAL SYMPTOMS/MOOD NEUROLOGICAL BOWEL NUTRITION STATUS      Continent Diet  AMBULATORY STATUS COMMUNICATION OF NEEDS Skin   Limited Assist Verbally                         Personal Care Assistance Level of Assistance  Bathing, Feeding, Dressing Bathing Assistance: Limited assistance Feeding assistance: Limited assistance Dressing Assistance: Limited assistance     Functional Limitations Info  Sight Sight Info: Impaired (Reading glasses)        SPECIAL CARE FACTORS FREQUENCY  PT (By licensed PT), OT (By licensed OT)     PT Frequency: 5x a week OT Frequency: 5x a week            Contractures Contractures Info: Not present    Additional Factors Info  Code Status, Allergies Code Status Info: Full Allergies Info: NKDA           Current Medications (09/20/2019):  This is the current hospital active medication list Current Facility-Administered Medications  Medication Dose Route Frequency Provider Last Rate Last Admin  . 0.9 %  sodium chloride infusion   Intravenous Continuous Athena Masse, MD 100 mL/hr at 09/20/19 1538 New Bag at 09/20/19 1538  . acetaminophen (TYLENOL) tablet 650 mg  650 mg Oral Q6H PRN Athena Masse, MD       Or  . acetaminophen (TYLENOL) suppository 650 mg  650 mg Rectal Q6H PRN Athena Masse, MD      . DULoxetine (CYMBALTA) DR capsule 60 mg  60 mg Oral Daily Dallie Piles, RPH   60 mg at 09/20/19 1308  .  enoxaparin (LOVENOX) injection 40 mg  40 mg Subcutaneous Q24H Athena Masse, MD   40 mg at 09/20/19 0839  . nicotine (NICODERM CQ - dosed in mg/24 hours) patch 21 mg  21 mg Transdermal Daily [name], MD   21 mg at 09/20/19 1041  . ondansetron (ZOFRAN) tablet 4 mg  4 mg Oral Q6H PRN Athena Masse, MD       Or  . ondansetron [facility]) injection 4 mg  4 mg Intravenous Q6H PRN Athena Masse, MD         Discharge Medications: Please see discharge summary for a list of discharge  medications.  Relevant Imaging Results:  Relevant Lab Results:   Additional Information #[id number]  Victorino Dike, RN

## 2019-09-20 NOTE — Evaluation (Signed)
Occupational Therapy Evaluation Patient Details Name: [name] [name] MRN: [id number] DOB: [birth year] Today's Date: 09/20/2019    History of Present Illness [name] [name] is a 43yoM who comes to [facility] on 8/19, was seen falling in neighbors yard. PMH: hypertension, spinal stenosis with neurogenic claudication, DDD osteoarthritis.   Clinical Impression   Patient presenting with decreased I in self care, balance, functional mobility/transfers, endurance, and safety awareness, as well as functional cognition. Pt reports multiple fall each week at home. Pt lives with son but reports he is not available to assist him. Patient reports being mod I PTA. Patient currently functioning at min - mod A overall. Pt with posterior bias in standing with frequent LOB. Increased time and mod verbal guidance cuing needing for sequencing and problem solving of novel tasks.  Patient will benefit from acute OT to increase overall independence in the areas of ADLs, functional mobility, and safety awareness in order to safely discharge to next venue of care.    Follow Up Recommendations  SNF;Supervision/Assistance - 24 hour    Equipment Recommendations  Other (comment) (defer to next venue of care)    Recommendations for Other Services Other (comment) (none at this time)     Precautions / Restrictions Precautions Precautions: Fall      Mobility Bed Mobility Overal bed mobility: Needs Assistance Bed Mobility: Sit to Supine;Supine to Sit     Supine to sit: Supervision Sit to supine: Min assist   General bed mobility comments: pt very delayed in responses with increased time to process information  Transfers Overall transfer level: Needs assistance Equipment used: None Transfers: Sit to/from Stand Sit to Stand: Min assist         General transfer comment: min A to stand from EOB with posterior bias noted    Balance Overall balance assessment: Needs assistance Sitting-balance support:  Single extremity supported;Feet supported Sitting balance-Leahy Scale: Good     Standing balance support: During functional activity Standing balance-Leahy Scale: Poor Standing balance comment: min -mod A for lateral steps up side of bed with posterior bias noted          ADL either performed or assessed with clinical judgement   ADL Overall ADL's : Needs assistance/impaired Eating/Feeding: Set up;Sitting Eating/Feeding Details (indicate cue type and reason): set up A to open containers Grooming: Wash/dry hands;Wash/dry face;Oral care;Sitting;Set up      Lower Body Dressing: Moderate assistance;Sit to/from stand     General ADL Comments: Pt with slow processing for novel ADL tasks. Pt needing mod verbal guidance cuing for sequencing and problem solving how to don/doff socks while seated on EOB. Pt needing cuing for figure four position.     Vision Baseline Vision/History: Wears glasses Wears Glasses: Reading only Patient Visual Report: No change from baseline Vision Assessment?: No apparent visual deficits            Pertinent Vitals/Pain Pain Assessment: No/denies pain     Hand Dominance Right   Extremity/Trunk Assessment Upper Extremity Assessment Upper Extremity Assessment: Generalized weakness;RUE deficits/detail RUE Deficits / Details: decreased shoulder elevation but WFLs   Lower Extremity Assessment Lower Extremity Assessment: Defer to PT evaluation   Cervical / Trunk Assessment Cervical / Trunk Assessment: Normal   Communication     Cognition Arousal/Alertness: Awake/alert Behavior During Therapy: WFL for tasks assessed/performed Overall Cognitive Status: Impaired/Different from baseline        General Comments: more awake, remains delayed, Sister reports pt seems altered from baseline still - per PT note  Home Living Family/patient expects to be discharged to:: Private residence Living Arrangements: Children Available Help at  Discharge: Family Type of Home: [place]: One level     Bathroom Shower/Tub: Tub/shower unit;Curtain     Bathroom Accessibility: Yes   Home Equipment: Environmental consultant - 2 wheels;Cane - single point          Prior Functioning/Environment Level of Independence: Needs assistance  Gait / Transfers Assistance Needed: RW for gait, often SPC as he cannot manage RW in/out of car ADL's / [place]: per pt still driving and grocery shopping, but admittedly struggles to manage groceries into house due to baseline balance issues            OT Problem List: Decreased strength;Decreased coordination;Decreased range of motion;Decreased cognition;Decreased activity tolerance;Decreased safety awareness;Impaired balance (sitting and/or standing);Decreased knowledge of use of DME or AE      OT Treatment/Interventions: Self-care/ADL training;Therapeutic exercise;Therapeutic activities;Energy conservation;Neuromuscular education;Patient/family education;Balance training;DME and/or AE instruction    OT Goals(Current goals can be found in the care plan section) Acute Rehab OT Goals Patient Stated Goal: stop falling so much, have more assistance at home. OT Goal Formulation: With patient Time For Goal Achievement: 10/04/19 Potential to Achieve Goals: Good ADL Goals Pt Will Perform Lower Body Dressing: with supervision;sit to/from stand Pt Will Transfer to Toilet: with supervision;ambulating;bedside commode Pt Will Perform Toileting - Clothing Manipulation and hygiene: with supervision;sit to/from stand  OT Frequency: Min 1X/week   Barriers to D/C: Decreased caregiver support  son lives with him but does not assist/provide care. Pt reports falling multiple times a week and that his son is busy with his pregnant girlfriend          AM-PAC OT "6 Clicks" Daily Activity     Outcome Measure Help from another person eating meals?: A Little Help from another person  taking care of personal grooming?: A Little Help from another person toileting, which includes using toliet, bedpan, or urinal?: A Lot Help from another person bathing (including washing, rinsing, drying)?: A Lot Help from another person to put on and taking off regular upper body clothing?: A Little Help from another person to put on and taking off regular lower body clothing?: A Lot 6 Click Score: 15   End of Session    Activity Tolerance: Patient tolerated treatment well Patient left: in bed;with call bell/phone within reach;with bed alarm set  OT Visit Diagnosis: Unsteadiness on feet (R26.81);Repeated falls (R29.6);Muscle weakness (generalized) (M62.81);History of falling (Z91.81)                Time: 1340-1400 OT Time Calculation (min): 20 min Charges:  OT General Charges $OT Visit: 1 Visit OT Evaluation $OT Eval Low Complexity: 1 Low OT Treatments $Self Care/Home Management : 8-22 mins  Darleen Crocker, MS, OTR/L , CBIS ascom [phone]  09/20/19, 3:14 PM

## 2019-09-20 NOTE — Progress Notes (Signed)
PROGRESS NOTE    [name] [name]  YCX:[id number] DOB: 13-Apr-1940 DOA: 09/19/2019 PCP: [name], [name], MD    Assessment & Plan:   Principal Problem:   AKI (acute kidney injury) [facility]) Active Problems:   DJD (degenerative joint disease) of knee   Hypertension, essential, benign   Ambulatory dysfunction   Dehydration   Fall at home, initial encounter   Weakness   Protein-calorie malnutrition, severe    [name] [name] is a 79 y.o. male with medical history significant for hypertension, spinal stenosis with neurogenic claudication, DDD osteoarthritis, ambulant with a cane who lives alone who presents to the emergency room after he was seen to have fallen in his neighbor's yard.  EMS was called and he was found lying on his back smoking a cigarette,.  Patient did ambulate at the scene.     AKI (acute kidney injury) ([place])   Dehydration -AKI likely prerenal.  Patient noted to be on thiazide diuretics which could be the reason -continue MIVF decrease to 50 ml/hr --Hold home HCTZ and losartan    Ambulatory dysfunction   Fall at home, initial encounter   DJD (degenerative joint disease) of knee Spinal stenosis with neurogenic claudication -At baseline patient ambulates with cane -Head CT negative for acute injury, CK normal PLAN: -hold oxycodone which may have contributed to impaired balance -PT rec SNF rehab    Hypertension, essential, benign --Hold home HCTZ and losartan 2/2 AKI    DVT prophylaxis: Lovenox SQ Code Status: Full code  Family Communication:  Status is: changed to inpatient Dispo:   The patient is from: home Anticipated d/c is to: SNF rehab Anticipated d/c date is: whenever bed available Patient currently is medically stable to d/c.   Subjective and Interval History:  Pt reported doing fine, no complaints.  Good appetite.     Objective: Vitals:   09/20/19 0130 09/20/19 0337 09/20/19 0919 09/20/19 1219  BP: 134/76 (!) 155/77 (!)  179/77 116/71  Pulse: (!) 44 (!) 55 (!) 44 (!) 53  Resp: 16 16 14 18   Temp: 97.6 F (36.4 C) 97.7 F (36.5 C) 97.6 F (36.4 C) 98 F (36.7 C)  TempSrc: Oral Oral Oral Oral  SpO2: 100% 100% 100% 100%  Weight:      Height:        Intake/Output Summary (Last 24 hours) at 09/20/2019 1838 Last data filed at 09/20/2019 1657 Gross per 24 hour  Intake 3475.32 ml  Output 2450 ml  Net 1025.32 ml   Filed Weights   09/19/19 0019  Weight: 59 kg    Examination:   Constitutional: NAD, AAOx3 HEENT: conjunctivae and lids normal, EOMI CV:  No cyanosis.   RESP: normal respiratory effort  Neuro: II - XII grossly intact.   Psych: Normal mood and affect.     Data Reviewed: I have personally reviewed following labs and imaging studies  CBC: Recent Labs  Lab 09/19/19 0021 09/20/19 0425  WBC 10.1 8.8  HGB 11.8* 12.2*  HCT 36.6* 37.3*  MCV 83.0 81.6  PLT 211 497   Basic Metabolic Panel: Recent Labs  Lab 09/19/19 0021 09/19/19 0829 09/20/19 0425  NA 140 140 136  K 3.9 3.7 3.8  CL 102 104 104  CO2 27 26 24   GLUCOSE 95 92 114*  BUN 31* 26* 26*  CREATININE 1.71* 1.44* 1.39*  CALCIUM 8.8* 8.6* 8.1*  MG  --   --  1.7   GFR: Estimated Creatinine Clearance: 36.6 mL/min (A) (by C-G formula  based on SCr of 1.39 mg/dL (H)). Liver Function Tests: Recent Labs  Lab 09/19/19 0021  AST 20  ALT 12  ALKPHOS 97  BILITOT 0.8  PROT 7.0  ALBUMIN 3.5   No results for input(s): LIPASE, AMYLASE in the last 168 hours. No results for input(s): AMMONIA in the last 168 hours. Coagulation Profile: No results for input(s): INR, PROTIME in the last 168 hours. Cardiac Enzymes: Recent Labs  Lab 09/19/19 0021  CKTOTAL 172   BNP (last 3 results) No results for input(s): PROBNP in the last 8760 hours. HbA1C: No results for input(s): HGBA1C in the last 72 hours. CBG: No results for input(s): GLUCAP in the last 168 hours. Lipid Profile: No results for input(s): CHOL, HDL, LDLCALC, TRIG,  CHOLHDL, LDLDIRECT in the last 72 hours. Thyroid Function Tests: No results for input(s): TSH, T4TOTAL, FREET4, T3FREE, THYROIDAB in the last 72 hours. Anemia Panel: No results for input(s): VITAMINB12, FOLATE, FERRITIN, TIBC, IRON, RETICCTPCT in the last 72 hours. Sepsis Labs: No results for input(s): PROCALCITON, LATICACIDVEN in the last 168 hours.  Recent Results (from the past 240 hour(s))  SARS Coronavirus 2 by RT PCR (hospital order, performed in [facility] hospital lab) Nasopharyngeal Nasopharyngeal Swab     Status: None   Collection Time: 09/19/19  8:15 PM   Specimen: Nasopharyngeal Swab  Result Value Ref Range Status   SARS Coronavirus 2 NEGATIVE NEGATIVE Final    Comment: (NOTE) SARS-CoV-2 target nucleic acids are NOT DETECTED.  The SARS-CoV-2 RNA is generally detectable in upper and lower respiratory specimens during the acute phase of infection. The lowest concentration of SARS-CoV-2 viral copies this assay can detect is 250 copies / mL. A negative result does not preclude SARS-CoV-2 infection and should not be used as the sole basis for treatment or other patient management decisions.  A negative result may occur with improper specimen collection / handling, submission of specimen other than nasopharyngeal swab, presence of viral mutation(s) within the areas targeted by this assay, and inadequate number of viral copies (<250 copies / mL). A negative result must be combined with clinical observations, patient history, and epidemiological information.  Fact Sheet for Patients:   StrictlyIdeas.no  Fact Sheet for Healthcare Providers: BankingDealers.co.za  This test is not yet approved or  cleared by the [place] FDA and has been authorized for detection and/or diagnosis of SARS-CoV-2 by FDA under an Emergency Use Authorization (EUA).  This EUA will remain in effect (meaning this test can be used) for the duration of  the COVID-19 declaration under Section 564(b)(1) of the Act, 21 U.S.C. section 360bbb-3(b)(1), unless the authorization is terminated or revoked sooner.  Performed at [facility], [address], [place], [place] [address]       Radiology Studies: CT Head Wo Contrast  Result Date: 09/19/2019 CLINICAL DATA:  Head trauma EXAM: CT HEAD WITHOUT CONTRAST TECHNIQUE: Contiguous axial images were obtained from the base of the skull through the vertex without intravenous contrast. COMPARISON:  None. FINDINGS: Brain: There is no mass, hemorrhage or extra-axial collection. The size and configuration of the ventricles and extra-axial CSF spaces are normal. There is hypoattenuation of the white matter, most commonly indicating chronic small vessel disease. Vascular: No abnormal hyperdensity of the major intracranial arteries or dural venous sinuses. No intracranial atherosclerosis. Skull: The visualized skull base, calvarium and extracranial soft tissues are normal. Sinuses/Orbits: No fluid levels or advanced mucosal thickening of the visualized paranasal sinuses. No mastoid or middle ear effusion. The  orbits are normal. IMPRESSION: 1. No acute intracranial abnormality. 2. Chronic small vessel disease. Electronically Signed   By: Ulyses Jarred M.D.   On: 09/19/2019 03:22     Scheduled Meds: . DULoxetine  60 mg Oral Daily  . enoxaparin (LOVENOX) injection  40 mg Subcutaneous Q24H  . nicotine  21 mg Transdermal Daily   Continuous Infusions: . sodium chloride 100 mL/hr at 09/20/19 1657     LOS: 0 days     [name], MD [facility] If 7PM-7AM, please contact night-coverage 09/20/2019, 6:38 PM

## 2019-09-20 NOTE — Progress Notes (Signed)
Initial Nutrition Assessment  DOCUMENTATION CODES:   Severe malnutrition in context of social or environmental circumstances  INTERVENTION:  Ensure Enlive po BID, each supplement provides 350 kcal and 20 grams of protein  MVI with minerals daily  NUTRITION DIAGNOSIS:   Severe Malnutrition related to social / environmental circumstances as evidenced by per patient/family report, energy intake < 75% for > or equal to 3 months, severe fat depletion, moderate fat depletion, mild muscle depletion, moderate muscle depletion.   GOAL:   Patient will meet greater than or equal to 90% of their needs    MONITOR:   Labs, I & O's, Supplement acceptance, PO intake, Weight trends  REASON FOR ASSESSMENT:   Malnutrition Screening Tool    ASSESSMENT:   79 year old male with past medical history of HTN, spinal stenosis with neurogenic claudication, DDD osteoarthritis, ambulant with a cane presented after a fall in his neighbor's yard admitted for AKI.  Met with patient bedside this afternoon, lunch tray in room with 100% po intake. Patient recalls roast beef, string beans, and mashed potatoes, stating it was pretty good, but needed some salt. Patient reports history of decreased appetite/intake, states he doesn't each that much after his wife's passing five years ago. Patient reports usual intake of 2 meals/day recalling sausage and eggs for breakfast and a sandwich for dinner. He endorses weight loss, recalling 171 lb a few years ago, more recently he reports usual body weight around 154 lb. Per chart, he weighed 64.2 kg (141.24 lb) on 07/16/19 at PCP office, noted +2 BLE edema at that time and on 05/03/19, he weighed 66 kg (145.2 lb) Currently he weighs 129.8 lb, indicating ~15 lb (10.6%) wt loss in the last 4 months; significant. Given trends, dietary recall meeting <75% of estimated needs, as well as moderate/severe fat and muscle depletions, pt meets criteria for severe malnutrition in the context  of social/environmental circumstances. RD educated on the importance of adequate nutrition, recommended daily nutrition supplement at home. Patient agreeable to drinking chocolate Ensure during admission.   Medications reviewed and include:  IVF: NaCl @ 100 ml/hr Labs: BUN 26 (H), Cr 1.39 (H), Hgb 12.2 (L), HCT 37.3 (L) K/Mg - WNL  NUTRITION - FOCUSED PHYSICAL EXAM: Findings: Severe orbital fat depletion; Moderate fat depletions to upper arm and buccal regions; Mild temporal muscle depletion; Moderate muscle depletions to clavicle; clavicle and acromion bone; dorsal hand    Diet Order:   Diet Order            Diet Heart Room service appropriate? Yes; Fluid consistency: Thin  Diet effective now                 EDUCATION NEEDS:   Education needs have been addressed  Skin:  Skin Assessment: Reviewed RN Assessment  Last BM:  8/20-type 6  Height:   Ht Readings from Last 1 Encounters:  09/19/19 5' 10"  (1.778 m)    Weight:   Wt Readings from Last 1 Encounters:  09/19/19 59 kg    Ideal Body Weight:  75.5 kg  BMI:  Body mass index is 18.65 kg/m.  Estimated Nutritional Needs:   Kcal:  1600-1800  Protein:  70-80  Fluid:  >/= 1.5 L/day  Lajuan Lines, RD, LDN Clinical Nutrition After Hours/Weekend Pager # in [place]

## 2019-09-20 NOTE — TOC Initial Note (Signed)
Transition of Care [facility]) - Initial/Assessment Note    Patient Details  Name: [name] [name] MRN: [id number] Date of Birth: 1940/06/26  Transition of [facility] CM/SW Contact:    Beverly Sessions, RN Phone Number: 09/20/2019, 3:57 PM  Clinical Narrative:                  Due to isolation precautions assessment completed via phone.   Patient states that normally he lives at home alone.  States that currently his son is staying with him.    Patient states that he has a rolling walker and can in the home.  Patient states that he relies on family for transportation.   PCP Dr [name].  Pharmacy [facility] Denies issues obtaining medications.   PT has assessed patient and recommends SNF.  Patient in agreement for SNF placement.    PASRR obtained FL2 sent for signature Bed search initiated  Expected Discharge Plan: [place] Barriers to Discharge: Continued Medical Work up   Patient Goals and CMS Choice        Expected Discharge Plan and Services Expected Discharge Plan: [place]       Living arrangements for the past 2 months: Single Family Home                                      Prior Living Arrangements/Services Living arrangements for the past 2 months: Single Family Home Lives with:: Self, Adult Children Patient language and need for interpreter reviewed:: Yes        Need for Family Participation in Patient Care: Yes (Comment)   Current home services: DME Criminal Activity/Legal Involvement Pertinent to Current Situation/Hospitalization: No - Comment as needed  Activities of Daily Living Home Assistive Devices/Equipment: Cane (specify quad or straight) ADL Screening (condition at time of admission) Patient's cognitive ability adequate to safely complete daily activities?: Yes Is the patient deaf or have difficulty hearing?: No Does the patient have difficulty seeing, even when wearing glasses/contacts?:  No Does the patient have difficulty concentrating, remembering, or making decisions?: No Patient able to express need for assistance with ADLs?: Yes Does the patient have difficulty dressing or bathing?: No Independently performs ADLs?: Yes (appropriate for developmental age) Does the patient have difficulty walking or climbing stairs?: Yes Weakness of Legs: Both Weakness of Arms/Hands: None  Permission Sought/Granted                  Emotional Assessment       Orientation: : Oriented to Self, Oriented to Place, Oriented to  Time, Oriented to Situation      Admission diagnosis:  AKI (acute kidney injury) ([place]) [N17.9] Weakness [R53.1] Patient Active Problem List   Diagnosis Date Noted  . Weakness 09/20/2019  . AKI (acute kidney injury) ([place]) 09/19/2019  . Ambulatory dysfunction 09/19/2019  . Dehydration 09/19/2019  . Fall at home, initial encounter 09/19/2019  . Aortic atherosclerosis ([place]) 02/16/2019  . Hypertension, essential, benign 08/13/2018  . Urinary frequency 02/05/2018  . PAD (peripheral artery disease) ([facility])-Mild 12/26/2017  . Chronic pain disorder 11/17/2017  . Unstable gait 11/17/2017  . BPH associated with nocturia 09/26/2017  . Allergic rhinitis 06/09/2017  . Tobacco use disorder 02/14/2017  . Generalized osteoarthritis of multiple sites 02/14/2017  . Bilateral lower extremity edema 08/12/2016  . Chronic right-sided low back pain with right-sided sciatica 03/14/2016  . Chronic pain of right knee  03/14/2016  . DDD (degenerative disc disease), lumbar 07/07/2014  . DDD (degenerative disc disease), cervical 07/07/2014  . DJD (degenerative joint disease) of knee 07/07/2014  . Bilateral occipital neuralgia 07/07/2014  . Spinal stenosis, lumbar region, with neurogenic claudication 07/07/2014   PCP:  [name], [name], MD Pharmacy:   [facility], [place] - [place] AT [facility] [address] [place] [address] Phone:  [phone] Fax: [phone]     Social Determinants of Health (SDOH) Interventions    Readmission Risk Interventions No flowsheet data found.

## 2019-09-20 NOTE — Progress Notes (Addendum)
Physical Therapy Treatment Patient Details Name: [name] [name] MRN: [id number] DOB: 11-Feb-1940 Today's Date: 09/20/2019    History of Present Illness [name] [name] is a 42yoM who comes to [facility] on 8/19, was seen falling in neighbors yard. PMH: hypertension, spinal stenosis with neurogenic claudication, DDD osteoarthritis.    PT Comments    Pt in bed upon entry, awake, agreeable to participate. Sister [name] [name] in room. Pt more alert this date, but remains delayed, requires 3-4x normal timing to complete simple statements. Pt struggles to rise to standing, then whilst AMB, moves slow and labored, has 5 complete LOB that require totalA from author for righting and avoidance of fall to floor. Pt has frequent lateral and posterior sway with delayed awareness, delayed reaction time, and delayed capacity for self correction. Sister is asked to frame current function in context of baseline level. She reports significant impairment in mentation and mobility. Sister also concerned that patient has lost significant weight since she saw him 2 weeks ago. Pt remains unsafe to return to home alone, and really needs more support for IADL. Pt and pt's sister educated extensively on his current safety issues and inappropriate nature of SPC for mobility at this time.     Follow Up Recommendations  SNF;Supervision/Assistance - 24 hour     Equipment Recommendations  None recommended by PT    Recommendations for Other Services       Precautions / Restrictions Precautions Precautions: Fall    Mobility  Bed Mobility Overal bed mobility: Needs Assistance Bed Mobility: Sit to Supine;Supine to Sit     Supine to sit: Supervision Sit to supine: Min assist      Transfers Overall transfer level: Needs assistance Equipment used: Straight cane Transfers: Sit to/from Stand Sit to Stand: Min guard         General transfer comment: 4 attempts with failure 3 times and collapse back to sitting, slow  movements in general with poor control  Ambulation/Gait Ambulation/Gait assistance: Min assist Gait Distance (Feet): 65 Feet Assistive device: Straight cane Gait Pattern/deviations: Step-to pattern     General Gait Details: 3-point gait, SPC too close and anteiror to be of much utility, 5 complete LOB with lateral or posterio sway, author continually uses a single arm hug strategy to right the patient and prevent fall to floor; Sister observes and is speechless in he rresponse inittially, then remarks of how altered pt is compared to baseline.   Stairs             Wheelchair Mobility    Modified Rankin (Stroke Patients Only)       Balance     Sitting balance-Leahy Scale: Good       Standing balance-Leahy Scale: Poor Standing balance comment: multiple full LOB required max-total A for righting                            Cognition Arousal/Alertness: Awake/alert Behavior During Therapy: WFL for tasks assessed/performed Overall Cognitive Status: Impaired/Different from baseline                                 General Comments: more awake, remains delayed, Sister reports pt seems altered from baseline still      Exercises      General Comments        Pertinent Vitals/Pain Pain Assessment: No/denies pain    Home Living  Prior Function            PT Goals (current goals can now be found in the care plan section) Acute Rehab PT Goals Patient Stated Goal: stop falling so much, have more assistance at home. PT Goal Formulation: With patient Time For Goal Achievement: 10/03/19 Potential to Achieve Goals: Poor Progress towards PT goals: Not progressing toward goals - comment    Frequency    Min 2X/week      PT Plan Current plan remains appropriate    Co-evaluation              AM-PAC PT "6 Clicks" Mobility   Outcome Measure  Help needed turning from your back to your side while in a  flat bed without using bedrails?: A Lot Help needed moving from lying on your back to sitting on the side of a flat bed without using bedrails?: A Lot Help needed moving to and from a bed to a chair (including a wheelchair)?: A Lot Help needed standing up from a chair using your arms (e.g., wheelchair or bedside chair)?: A Lot Help needed to walk in hospital room?: A Lot Help needed climbing 3-5 steps with a railing? : A Lot 6 Click Score: 12    End of Session Equipment Utilized During Treatment: Gait belt Activity Tolerance: Patient limited by fatigue;Patient limited by lethargy Patient left: in bed;with call bell/phone within reach;Other (comment);with family/visitor present Nurse Communication: Mobility status PT Visit Diagnosis: Unsteadiness on feet (R26.81);Difficulty in walking, not elsewhere classified (R26.2);Other abnormalities of gait and mobility (R26.89);Other symptoms and signs involving the nervous system (R29.898)     Time: 1130-1159 PT Time Calculation (min) (ACUTE ONLY): 29 min  Charges:  $Gait Training: 8-22 mins $Neuromuscular Re-education: 8-22 mins                     1:10 PM, 09/20/19 Etta Grandchild, PT, DPT Physical Therapist - [facility]  [phone] ([place])    Inger C 09/20/2019, 1:07 PM

## 2019-09-20 NOTE — Care Management Obs Status (Signed)
[facility] NOTIFICATION   Patient Details  Name: Urie Loughner MRN: [id number] Date of Birth: [birth year]   Medicare Observation Status Notification Given:  Yes    Beverly Sessions, RN 09/20/2019, 3:08 PM

## 2019-09-21 LAB — CBC
HCT: 40.1 % (ref 39.0–52.0)
Hemoglobin: 13.2 g/dL (ref 13.0–17.0)
MCH: 26.5 pg (ref 26.0–34.0)
MCHC: 32.9 g/dL (ref 30.0–36.0)
MCV: 80.4 fL (ref 80.0–100.0)
Platelets: 220 10*3/uL (ref 150–400)
RBC: 4.99 MIL/uL (ref 4.22–5.81)
RDW: 13.9 % (ref 11.5–15.5)
WBC: 9.5 10*3/uL (ref 4.0–10.5)
nRBC: 0 % (ref 0.0–0.2)

## 2019-09-21 LAB — BASIC METABOLIC PANEL
Anion gap: 11 (ref 5–15)
BUN: 21 mg/dL (ref 8–23)
CO2: 20 mmol/L — ABNORMAL LOW (ref 22–32)
Calcium: 8.2 mg/dL — ABNORMAL LOW (ref 8.9–10.3)
Chloride: 108 mmol/L (ref 98–111)
Creatinine, Ser: 1.07 mg/dL (ref 0.61–1.24)
GFR calc Af Amer: >60 mL/min (ref >60)
GFR calc non Af Amer: >60 mL/min (ref >60)
Glucose, Bld: 90 mg/dL (ref 70–99)
Potassium: 3.9 mmol/L (ref 3.5–5.1)
Sodium: 139 mmol/L (ref 135–145)

## 2019-09-21 LAB — MAGNESIUM: Magnesium: 1.8 mg/dL (ref 1.7–2.4)

## 2019-09-21 NOTE — Progress Notes (Signed)
Physical Therapy Treatment Patient Details Name: [name] [name] MRN: [id number] DOB: Jan 05, 1941 Today's Date: 09/21/2019    History of Present Illness [name] [name] is a 8yoM who comes to [facility] on 8/19, was seen falling in neighbors yard. PMH: hypertension, spinal stenosis with neurogenic claudication, DDD osteoarthritis.    PT Comments    Patient is asleep and agrees to PT treatment but says he feels rough. He needs min assist for supine to sit on edge of bed. He has posterior lean in standing at edge of bed with posterior thigh support on edge of bed. He has decreased activity tolerance and decreased standing time due to fatigue. He will continue to benefit from skilled PT to improve mobility and strength.   Follow Up Recommendations  SNF     Equipment Recommendations  Rolling walker with 5" wheels    Recommendations for Other Services       Precautions / Restrictions Precautions Precautions: Fall    Mobility  Bed Mobility Overal bed mobility: Needs Assistance Bed Mobility: Supine to Sit;Sit to Supine     Supine to sit: Min guard Sit to supine: Min assist   General bed mobility comments: delayed  Transfers Overall transfer level: Needs assistance Equipment used: Rolling walker (2 wheeled) Transfers: Sit to/from Stand Sit to Stand: Min assist         General transfer comment: has posterior lean and uses posterior legs for balance on bed.  Ambulation/Gait                 Stairs             Wheelchair Mobility    Modified Rankin (Stroke Patients Only)       Balance Overall balance assessment: Needs assistance Sitting-balance support: Single extremity supported Sitting balance-Leahy Scale: Good     Standing balance support: Bilateral upper extremity supported Standing balance-Leahy Scale: Poor Standing balance comment:  (posterior lean standing)                            Cognition Arousal/Alertness:  Awake/alert Behavior During Therapy: WFL for tasks assessed/performed Overall Cognitive Status: Difficult to assess                                        Exercises      General Comments        Pertinent Vitals/Pain Pain Assessment: No/denies pain    Home Living                      Prior Function            PT Goals (current goals can now be found in the care plan section) Acute Rehab PT Goals Patient Stated Goal: stop falling so much, have more assistance at home. Progress towards PT goals: Progressing toward goals    Frequency    Min 2X/week      PT Plan Current plan remains appropriate    Co-evaluation              AM-PAC PT "6 Clicks" Mobility   Outcome Measure  Help needed turning from your back to your side while in a flat bed without using bedrails?: A Lot Help needed moving from lying on your back to sitting on the side of a flat bed without using bedrails?: A Lot Help needed  moving to and from a bed to a chair (including a wheelchair)?: A Lot Help needed standing up from a chair using your arms (e.g., wheelchair or bedside chair)?: A Lot Help needed to walk in hospital room?: A Lot Help needed climbing 3-5 steps with a railing? : Total 6 Click Score: 11    End of Session Equipment Utilized During Treatment: Gait belt Activity Tolerance: Patient tolerated treatment well Patient left: in bed;with bed alarm set Nurse Communication: Mobility status PT Visit Diagnosis: Unsteadiness on feet (R26.81);Muscle weakness (generalized) (M62.81);Difficulty in walking, not elsewhere classified (R26.2)     Time: 1594-7076 PT Time Calculation (min) (ACUTE ONLY): 20 min  Charges:  $Therapeutic Activity: 8-22 mins                       Alanson Puls, PT DPT 09/21/2019, 12:43 PM

## 2019-09-21 NOTE — Progress Notes (Signed)
PROGRESS NOTE    [name] [name]  JSE:[id number] DOB: 03-28-1940 DOA: 09/19/2019 PCP: [name], [name], MD    Assessment & Plan:   Principal Problem:   AKI (acute kidney injury) [facility]) Active Problems:   DJD (degenerative joint disease) of knee   Hypertension, essential, benign   Ambulatory dysfunction   Dehydration   Fall at home, initial encounter   Weakness   Protein-calorie malnutrition, severe    [name] [name] is a 79 y.o. male with medical history significant for hypertension, spinal stenosis with neurogenic claudication, DDD osteoarthritis, ambulant with a cane who lives alone who presents to the emergency room after he was seen to have fallen in his neighbor's yard.  EMS was called and he was found lying on his back smoking a cigarette,.  Patient did ambulate at the scene.     AKI (acute kidney injury) ([place])   Dehydration -AKI likely prerenal.  Patient noted to be on thiazide diuretics which could be the reason --Cr back to baseline after IVF PLAN: -d/c MIVF now that pt is hydrating well orally --Hold home HCTZ and losartan    Ambulatory dysfunction   Fall at home, initial encounter   DJD (degenerative joint disease) of knee Spinal stenosis with neurogenic claudication -At baseline patient ambulates with cane -Head CT negative for acute injury, CK normal PLAN: -hold oxycodone which may have contributed to impaired balance -PT rec SNF rehab    Hypertension, essential, benign --Hold home HCTZ and losartan 2/2 AKI    DVT prophylaxis: Lovenox SQ Code Status: Full code  Family Communication: girlfriend updated on the phone today Status is: changed to inpatient Dispo:   The patient is from: home Anticipated d/c is to: SNF rehab Anticipated d/c date is: whenever bed available Patient currently is medically stable to d/c.   Subjective and Interval History:  No complaints.  Eating well.   Objective: Vitals:   09/20/19 2006 09/21/19 0411  09/21/19 0412 09/21/19 1153  BP: 127/80 (!) 177/96 (!) 159/88 126/74  Pulse: 62 (!) 56 (!) 54 62  Resp: 16 16  16   Temp: 99.1 F (37.3 C) 98.3 F (36.8 C)  98 F (36.7 C)  TempSrc: Oral Oral  Oral  SpO2: 100% 100%  99%  Weight:      Height:        Intake/Output Summary (Last 24 hours) at 09/21/2019 1902 Last data filed at 09/21/2019 1834 Gross per 24 hour  Intake 1300.16 ml  Output 2600 ml  Net -1299.84 ml   Filed Weights   09/19/19 0019  Weight: 59 kg    Examination:   Constitutional: NAD, AAOx3 HEENT: conjunctivae and lids normal, EOMI CV:  No cyanosis.   RESP: normal respiratory effort  Neuro: II - XII grossly intact.   Psych: Normal mood and affect.     Data Reviewed: I have personally reviewed following labs and imaging studies  CBC: Recent Labs  Lab 09/19/19 0021 09/20/19 0425 09/21/19 0856  WBC 10.1 8.8 9.5  HGB 11.8* 12.2* 13.2  HCT 36.6* 37.3* 40.1  MCV 83.0 81.6 80.4  PLT 211 203 073   Basic Metabolic Panel: Recent Labs  Lab 09/19/19 0021 09/19/19 0829 09/20/19 0425 09/21/19 0759  NA 140 140 136 139  K 3.9 3.7 3.8 3.9  CL 102 104 104 108  CO2 27 26 24  20*  GLUCOSE 95 92 114* 90  BUN 31* 26* 26* 21  CREATININE 1.71* 1.44* 1.39* 1.07  CALCIUM 8.8* 8.6* 8.1* 8.2*  MG  --   --  1.7 1.8   GFR: Estimated Creatinine Clearance: 47.5 mL/min (by C-G formula based on SCr of 1.07 mg/dL). Liver Function Tests: Recent Labs  Lab 09/19/19 0021  AST 20  ALT 12  ALKPHOS 97  BILITOT 0.8  PROT 7.0  ALBUMIN 3.5   No results for input(s): LIPASE, AMYLASE in the last 168 hours. No results for input(s): AMMONIA in the last 168 hours. Coagulation Profile: No results for input(s): INR, PROTIME in the last 168 hours. Cardiac Enzymes: Recent Labs  Lab 09/19/19 0021  CKTOTAL 172   BNP (last 3 results) No results for input(s): PROBNP in the last 8760 hours. HbA1C: No results for input(s): HGBA1C in the last 72 hours. CBG: No results for  input(s): GLUCAP in the last 168 hours. Lipid Profile: No results for input(s): CHOL, HDL, LDLCALC, TRIG, CHOLHDL, LDLDIRECT in the last 72 hours. Thyroid Function Tests: No results for input(s): TSH, T4TOTAL, FREET4, T3FREE, THYROIDAB in the last 72 hours. Anemia Panel: No results for input(s): VITAMINB12, FOLATE, FERRITIN, TIBC, IRON, RETICCTPCT in the last 72 hours. Sepsis Labs: No results for input(s): PROCALCITON, LATICACIDVEN in the last 168 hours.  Recent Results (from the past 240 hour(s))  SARS Coronavirus 2 by RT PCR (hospital order, performed in [facility] hospital lab) Nasopharyngeal Nasopharyngeal Swab     Status: None   Collection Time: 09/19/19  8:15 PM   Specimen: Nasopharyngeal Swab  Result Value Ref Range Status   SARS Coronavirus 2 NEGATIVE NEGATIVE Final    Comment: (NOTE) SARS-CoV-2 target nucleic acids are NOT DETECTED.  The SARS-CoV-2 RNA is generally detectable in upper and lower respiratory specimens during the acute phase of infection. The lowest concentration of SARS-CoV-2 viral copies this assay can detect is 250 copies / mL. A negative result does not preclude SARS-CoV-2 infection and should not be used as the sole basis for treatment or other patient management decisions.  A negative result may occur with improper specimen collection / handling, submission of specimen other than nasopharyngeal swab, presence of viral mutation(s) within the areas targeted by this assay, and inadequate number of viral copies (<250 copies / mL). A negative result must be combined with clinical observations, patient history, and epidemiological information.  Fact Sheet for Patients:   StrictlyIdeas.no  Fact Sheet for Healthcare Providers: BankingDealers.co.za  This test is not yet approved or  cleared by the [place] FDA and has been authorized for detection and/or diagnosis of SARS-CoV-2 by FDA under an Emergency Use  Authorization (EUA).  This EUA will remain in effect (meaning this test can be used) for the duration of the COVID-19 declaration under Section 564(b)(1) of the Act, 21 U.S.C. section 360bbb-3(b)(1), unless the authorization is terminated or revoked sooner.  Performed at [facility], [address] [place], [place] [address]       Radiology Studies: No results found.   Scheduled Meds: . DULoxetine  60 mg Oral Daily  . enoxaparin (LOVENOX) injection  40 mg Subcutaneous Q24H  . nicotine  21 mg Transdermal Daily   Continuous Infusions:    LOS: 1 day     [name], MD [facility] If 7PM-7AM, please contact night-coverage 09/21/2019, 7:02 PM

## 2019-09-22 DIAGNOSIS — M6281 Muscle weakness (generalized): Secondary | ICD-10-CM | POA: Diagnosis not present

## 2019-09-22 DIAGNOSIS — M2550 Pain in unspecified joint: Secondary | ICD-10-CM | POA: Diagnosis not present

## 2019-09-22 DIAGNOSIS — N179 Acute kidney failure, unspecified: Secondary | ICD-10-CM | POA: Diagnosis not present

## 2019-09-22 DIAGNOSIS — R278 Other lack of coordination: Secondary | ICD-10-CM | POA: Diagnosis not present

## 2019-09-22 DIAGNOSIS — R2681 Unsteadiness on feet: Secondary | ICD-10-CM | POA: Diagnosis not present

## 2019-09-22 DIAGNOSIS — E43 Unspecified severe protein-calorie malnutrition: Secondary | ICD-10-CM | POA: Diagnosis not present

## 2019-09-22 DIAGNOSIS — R296 Repeated falls: Secondary | ICD-10-CM | POA: Diagnosis not present

## 2019-09-22 DIAGNOSIS — I119 Hypertensive heart disease without heart failure: Secondary | ICD-10-CM | POA: Diagnosis not present

## 2019-09-22 DIAGNOSIS — R531 Weakness: Secondary | ICD-10-CM | POA: Diagnosis not present

## 2019-09-22 DIAGNOSIS — J9601 Acute respiratory failure with hypoxia: Secondary | ICD-10-CM | POA: Diagnosis not present

## 2019-09-22 DIAGNOSIS — E860 Dehydration: Secondary | ICD-10-CM | POA: Diagnosis not present

## 2019-09-22 DIAGNOSIS — M170 Bilateral primary osteoarthritis of knee: Secondary | ICD-10-CM | POA: Diagnosis not present

## 2019-09-22 DIAGNOSIS — Z7401 Bed confinement status: Secondary | ICD-10-CM | POA: Diagnosis not present

## 2019-09-22 DIAGNOSIS — M48062 Spinal stenosis, lumbar region with neurogenic claudication: Secondary | ICD-10-CM | POA: Diagnosis not present

## 2019-09-22 LAB — CBC
HCT: 38.4 % — ABNORMAL LOW (ref 39.0–52.0)
Hemoglobin: 12.6 g/dL — ABNORMAL LOW (ref 13.0–17.0)
MCH: 26.8 pg (ref 26.0–34.0)
MCHC: 32.8 g/dL (ref 30.0–36.0)
MCV: 81.5 fL (ref 80.0–100.0)
Platelets: 236 10*3/uL (ref 150–400)
RBC: 4.71 MIL/uL (ref 4.22–5.81)
RDW: 13.8 % (ref 11.5–15.5)
WBC: 9.5 10*3/uL (ref 4.0–10.5)
nRBC: 0 % (ref 0.0–0.2)

## 2019-09-22 LAB — BASIC METABOLIC PANEL
Anion gap: 9 (ref 5–15)
BUN: 25 mg/dL — ABNORMAL HIGH (ref 8–23)
CO2: 23 mmol/L (ref 22–32)
Calcium: 8.5 mg/dL — ABNORMAL LOW (ref 8.9–10.3)
Chloride: 105 mmol/L (ref 98–111)
Creatinine, Ser: 1.07 mg/dL (ref 0.61–1.24)
GFR calc Af Amer: >60 mL/min (ref >60)
GFR calc non Af Amer: >60 mL/min (ref >60)
Glucose, Bld: 88 mg/dL (ref 70–99)
Potassium: 3.9 mmol/L (ref 3.5–5.1)
Sodium: 137 mmol/L (ref 135–145)

## 2019-09-22 LAB — MAGNESIUM: Magnesium: 1.8 mg/dL (ref 1.7–2.4)

## 2019-09-22 LAB — SARS CORONAVIRUS 2 BY RT PCR (HOSPITAL ORDER, PERFORMED IN ~~LOC~~ HOSPITAL LAB): SARS Coronavirus 2: NEGATIVE

## 2019-09-22 MED ORDER — NICOTINE 21 MG/24HR TD PT24: 21.0000 mg | MEDICATED_PATCH | Freq: Every day | TRANSDERMAL | 0 refills | Status: DC

## 2019-09-22 MED ORDER — POLYETHYLENE GLYCOL 3350 17 G PO PACK: 34.0000 g | PACK | ORAL | Status: DC

## 2019-09-22 MED ORDER — POLYETHYLENE GLYCOL 3350 17 G PO PACK: 34.0000 g | PACK | Freq: Two times a day (BID) | ORAL | 0 refills | Status: DC

## 2019-09-22 NOTE — Progress Notes (Addendum)
Discharge order received. Patient mental status is at baseline. Vital signs stable . No signs of acute distress. Attempted to give report to [facility] but per receptionist , no one is available to receive report. Our Nursing secretary told them that we are sending patient.[name] the sister [phone])  was also made aware and instructed to pick patient belonging that was left on the floor. Pt took wallet and car/home keys with him to [facility]. No other issues noted . EMS transported patient.

## 2019-09-22 NOTE — Discharge Summary (Signed)
Physician Discharge Summary   [name] [name]  male DOB: 02/28/1940  HLK:[id number]  PCP: [name], [name], MD  Admit date: 09/19/2019 Discharge date: 09/22/2019  Admitted From: home Disposition:  SNF CODE STATUS: Full code   Hospital Course:  For full details, please see H&P, progress notes, consult notes and ancillary notes.  Briefly,  [name] [name] a 79 y.o.AA malewith medical history significant forhypertension, spinal stenosis with neurogenic claudication, DDD osteoarthritis, ambulant with a cane who presented to the emergency room after he was seen to have fallen in his neighbor's yard. EMS was called and he was found lying on his back smoking a cigarette.   # AKI (acute kidney injury) ([place]) # Dehydration AKI likely prerenal. Patient noted to be on thiazide diuretics which could be the reason.  Pt received IVF and Cr was back to baseline 1.07 prior to discharge.  Home HCTZ and Losartan were held during hospitalization.  Losartan was resumed at discharge.  # Ambulatory dysfunction # Fall at home, initial encounter # DJD (degenerative joint disease) of knee # Spinal stenosis with neurogenic claudication Head CT negative for acute injury, CK normal.  PT rec SNF rehab.  Home oxycodone may have contributed to impaired balance so it was discontinued.  Hypertension, essential, benign Home HCTZ and Losartan were held during hospitalization.  Losartan was resumed at discharge.   Discharge Diagnoses:  Principal Problem:   AKI (acute kidney injury) ([place]) Active Problems:   DJD (degenerative joint disease) of knee   Hypertension, essential, benign   Ambulatory dysfunction   Dehydration   Fall at home, initial encounter   Weakness   Protein-calorie malnutrition, severe    Discharge Instructions:  Allergies as of 09/22/2019   No Known Allergies     Medication List    STOP taking these medications   hydrochlorothiazide 12.5 MG  tablet Commonly known as: HYDRODIURIL   Oxycodone HCl 10 MG Tabs     TAKE these medications   DULoxetine 60 MG capsule Commonly known as: Cymbalta Take 1 capsule (60 mg total) by mouth daily.   losartan 50 MG tablet Commonly known as: COZAAR Take 1 tablet (50 mg total) by mouth daily.   nicotine 21 mg/24hr patch Commonly known as: NICODERM CQ - dosed in mg/24 hours Place 1 patch (21 mg total) onto the skin daily. Start taking on: September 23, 2019   polyethylene glycol 17 g packet Commonly known as: MIRALAX / GLYCOLAX Take 34 g by mouth 2 (two) times daily.        Follow-up Information    [name], [name], MD. Schedule an appointment as soon as possible for a visit in 1 week(s).   Specialty: Family Medicine Contact information: [place] [address] [phone]               No Known Allergies   The results of significant diagnostics from this hospitalization (including imaging, microbiology, ancillary and laboratory) are listed below for reference.   Consultations:   Procedures/Studies: CT Head Wo Contrast  Result Date: 09/19/2019 CLINICAL DATA:  Head trauma EXAM: CT HEAD WITHOUT CONTRAST TECHNIQUE: Contiguous axial images were obtained from the base of the skull through the vertex without intravenous contrast. COMPARISON:  None. FINDINGS: Brain: There is no mass, hemorrhage or extra-axial collection. The size and configuration of the ventricles and extra-axial CSF spaces are normal. There is hypoattenuation of the white matter, most commonly indicating chronic small vessel disease. Vascular: No abnormal hyperdensity of the  major intracranial arteries or dural venous sinuses. No intracranial atherosclerosis. Skull: The visualized skull base, calvarium and extracranial soft tissues are normal. Sinuses/Orbits: No fluid levels or advanced mucosal thickening of the visualized paranasal sinuses. No mastoid or middle ear effusion. The orbits are  normal. IMPRESSION: 1. No acute intracranial abnormality. 2. Chronic small vessel disease. Electronically Signed   By: Ulyses Jarred M.D.   On: 09/19/2019 03:22      Labs: BNP (last 3 results) No results for input(s): BNP in the last 8760 hours. Basic Metabolic Panel: Recent Labs  Lab 09/19/19 0021 09/19/19 0829 09/20/19 0425 09/21/19 0759 09/22/19 0739  NA 140 140 136 139 137  K 3.9 3.7 3.8 3.9 3.9  CL 102 104 104 108 105  CO2 27 26 24  20* 23  GLUCOSE 95 92 114* 90 88  BUN 31* 26* 26* 21 25*  CREATININE 1.71* 1.44* 1.39* 1.07 1.07  CALCIUM 8.8* 8.6* 8.1* 8.2* 8.5*  MG  --   --  1.7 1.8 1.8   Liver Function Tests: Recent Labs  Lab 09/19/19 0021  AST 20  ALT 12  ALKPHOS 97  BILITOT 0.8  PROT 7.0  ALBUMIN 3.5   No results for input(s): LIPASE, AMYLASE in the last 168 hours. No results for input(s): AMMONIA in the last 168 hours. CBC: Recent Labs  Lab 09/19/19 0021 09/20/19 0425 09/21/19 0856 09/22/19 0739  WBC 10.1 8.8 9.5 9.5  HGB 11.8* 12.2* 13.2 12.6*  HCT 36.6* 37.3* 40.1 38.4*  MCV 83.0 81.6 80.4 81.5  PLT 211 203 220 236   Cardiac Enzymes: Recent Labs  Lab 09/19/19 0021  CKTOTAL 172   BNP: Invalid input(s): POCBNP CBG: No results for input(s): GLUCAP in the last 168 hours. D-Dimer No results for input(s): DDIMER in the last 72 hours. Hgb A1c No results for input(s): HGBA1C in the last 72 hours. Lipid Profile No results for input(s): CHOL, HDL, LDLCALC, TRIG, CHOLHDL, LDLDIRECT in the last 72 hours. Thyroid function studies No results for input(s): TSH, T4TOTAL, T3FREE, THYROIDAB in the last 72 hours.  Invalid input(s): FREET3 Anemia work up No results for input(s): VITAMINB12, FOLATE, FERRITIN, TIBC, IRON, RETICCTPCT in the last 72 hours. Urinalysis    Component Value Date/Time   COLORURINE YELLOW (A) 09/19/2019 0349   APPEARANCEUR CLEAR (A) 09/19/2019 0349   APPEARANCEUR Cloudy 01/02/2012 1326   LABSPEC 1.018 09/19/2019 0349    LABSPEC 1.017 01/02/2012 1326   PHURINE 5.0 09/19/2019 0349   GLUCOSEU NEGATIVE 09/19/2019 0349   GLUCOSEU NEGATIVE 09/26/2017 1104   HGBUR NEGATIVE 09/19/2019 0349   BILIRUBINUR NEGATIVE 09/19/2019 0349   BILIRUBINUR Negative 01/02/2012 1326   KETONESUR NEGATIVE 09/19/2019 0349   PROTEINUR NEGATIVE 09/19/2019 0349   UROBILINOGEN 0.2 09/26/2017 1104   NITRITE NEGATIVE 09/19/2019 0349   LEUKOCYTESUR NEGATIVE 09/19/2019 0349   LEUKOCYTESUR 3+ 01/02/2012 1326   Sepsis Labs Invalid input(s): PROCALCITONIN,  WBC,  LACTICIDVEN Microbiology Recent Results (from the past 240 hour(s))  SARS Coronavirus 2 by RT PCR (hospital order, performed in [place] hospital lab) Nasopharyngeal Nasopharyngeal Swab     Status: None   Collection Time: 09/19/19  8:15 PM   Specimen: Nasopharyngeal Swab  Result Value Ref Range Status   SARS Coronavirus 2 NEGATIVE NEGATIVE Final    Comment: (NOTE) SARS-CoV-2 target nucleic acids are NOT DETECTED.  The SARS-CoV-2 RNA is generally detectable in upper and lower respiratory specimens during the acute phase of infection. The lowest concentration of SARS-CoV-2 viral copies this assay  can detect is 250 copies / mL. A negative result does not preclude SARS-CoV-2 infection and should not be used as the sole basis for treatment or other patient management decisions.  A negative result may occur with improper specimen collection / handling, submission of specimen other than nasopharyngeal swab, presence of viral mutation(s) within the areas targeted by this assay, and inadequate number of viral copies (<250 copies / mL). A negative result must be combined with clinical observations, patient history, and epidemiological information.  Fact Sheet for Patients:   StrictlyIdeas.no  Fact Sheet for Healthcare Providers: BankingDealers.co.za  This test is not yet approved or  cleared by the [place] FDA and has been  authorized for detection and/or diagnosis of SARS-CoV-2 by FDA under an Emergency Use Authorization (EUA).  This EUA will remain in effect (meaning this test can be used) for the duration of the COVID-19 declaration under Section 564(b)(1) of the Act, 21 U.S.C. section 360bbb-3(b)(1), unless the authorization is terminated or revoked sooner.  Performed at [facility], [address] [place], [place] [address]      Total time spend on discharging this patient, including the last patient exam, discussing the hospital stay, instructions for ongoing care as it relates to all pertinent caregivers, as well as preparing the medical discharge records, prescriptions, and/or referrals as applicable, is 25 minutes.    [name], MD  [facility] 09/22/2019, 4:27 PM  If 7PM-7AM, please contact night-coverage

## 2019-09-22 NOTE — TOC Progression Note (Signed)
Transition of Care [facility]) - Progression Note    Patient Details  Name: [name] [name] MRN: [id number] Date of Birth: 24-Nov-1940  Transition of Care [facility]) CM/SW Contact  Belvin Gauss, [place], [place] Phone Number: 09/22/2019, 4:23 PM  Clinical Narrative:    Return call back from [facility] Patient can transition there once covid test comes back. Lavella Lemons to call this social worker back with the room number and fax number to fax the  discharge summary when ready.Elliot Gurney, LCSW Transition of care [phone]    Expected Discharge Plan: [place] Barriers to Discharge: Continued Medical Work up  Expected Discharge Plan and Services Expected Discharge Plan: [place] arrangements for the past 2 months: Single Family Home                                       Social Determinants of Health (SDOH) Interventions    Readmission Risk Interventions No flowsheet data found.

## 2019-09-22 NOTE — TOC Progression Note (Signed)
Transition of Care [facility]) - Progression Note    Patient Details  Name: [name] [name] MRN: [id number] Date of Birth: 1940/07/20  Transition of Care [facility]) CM/SW Contact  Jasaiah Karwowski, [place], [place] Phone Number: 09/22/2019, 5:12 PM  Clinical Narrative:    Patient to discharge to [facility]. Patient to be transported by [facility]. Patient will be going to room 34A. Report to be called in to nursing [phone]. Discharge summary and COVID results faxed to [phone]. EMS called for transport.   [address] LCSW Transition of Care [phone]   Expected Discharge Plan: [place] Barriers to Discharge: Continued Medical Work up  Expected Discharge Plan and Services Expected Discharge Plan: [place] arrangements for the past 2 months: Single Family Home Expected Discharge Date: 09/22/19                                     Social Determinants of Health (SDOH) Interventions    Readmission Risk Interventions No flowsheet data found.

## 2019-09-22 NOTE — TOC Progression Note (Addendum)
Transition of Care [facility]) - Progression Note    Patient Details  Name: [name] [name] MRN: [id number] Date of Birth: July 11, 1940  Transition of Care [facility]) CM/SW Contact  Elliot Gurney [place], [place] Phone Number: [phone] 09/22/2019, 2:56 PM  Clinical Narrative:    Patient contacted to discuss bed offers. Patient has accepted [facility]. Phone call to Lavella Lemons  at [facility] to discuss bed availability. Patient will need a new COVID test before he is admitted. Tanya to call this social worker back within the hour to discuss bed offer further.  Transition of Care social work to continue to follow   Expected Discharge Plan: [place] Barriers to Discharge: Continued Medical Work up  Expected Discharge Plan and Services Expected Discharge Plan: [place]       Living arrangements for the past 2 months: Single Family Home                                       Social Determinants of Health (SDOH) Interventions    Readmission Risk Interventions No flowsheet data found.

## 2019-09-23 ENCOUNTER — Telehealth: Payer: Self-pay | Admitting: Family Medicine

## 2019-09-23 DIAGNOSIS — N179 Acute kidney failure, unspecified: Secondary | ICD-10-CM | POA: Diagnosis not present

## 2019-09-23 DIAGNOSIS — I119 Hypertensive heart disease without heart failure: Secondary | ICD-10-CM | POA: Diagnosis not present

## 2019-09-23 DIAGNOSIS — E860 Dehydration: Secondary | ICD-10-CM | POA: Diagnosis not present

## 2019-09-23 DIAGNOSIS — M48062 Spinal stenosis, lumbar region with neurogenic claudication: Secondary | ICD-10-CM | POA: Diagnosis not present

## 2019-09-23 NOTE — Telephone Encounter (Signed)
Transition Care Management Unsuccessful Follow-up Telephone Call  Date of discharge and from where:  [facility]  Attempts:  1st Attempt  Reason for unsuccessful TCM follow-up call:  Missing or invalid number

## 2019-09-25 NOTE — Telephone Encounter (Signed)
Transition Care Management Unsuccessful Follow-up Telephone Call  Date of discharge and from where:  09/22/2019  Attempts:  2nd Attempt  Reason for unsuccessful TCM follow-up call:  Missing or invalid number

## 2019-10-04 DIAGNOSIS — M5136 Other intervertebral disc degeneration, lumbar region: Secondary | ICD-10-CM | POA: Diagnosis not present

## 2019-10-04 DIAGNOSIS — F0390 Unspecified dementia without behavioral disturbance: Secondary | ICD-10-CM | POA: Diagnosis not present

## 2019-10-04 DIAGNOSIS — M5441 Lumbago with sciatica, right side: Secondary | ICD-10-CM | POA: Diagnosis not present

## 2019-10-04 DIAGNOSIS — M5481 Occipital neuralgia: Secondary | ICD-10-CM | POA: Diagnosis not present

## 2019-10-04 DIAGNOSIS — M159 Polyosteoarthritis, unspecified: Secondary | ICD-10-CM | POA: Diagnosis not present

## 2019-10-04 DIAGNOSIS — M5030 Other cervical disc degeneration, unspecified cervical region: Secondary | ICD-10-CM | POA: Diagnosis not present

## 2019-10-04 DIAGNOSIS — G894 Chronic pain syndrome: Secondary | ICD-10-CM | POA: Diagnosis not present

## 2019-10-04 DIAGNOSIS — I119 Hypertensive heart disease without heart failure: Secondary | ICD-10-CM | POA: Diagnosis not present

## 2019-10-04 DIAGNOSIS — M48062 Spinal stenosis, lumbar region with neurogenic claudication: Secondary | ICD-10-CM | POA: Diagnosis not present

## 2019-10-07 DIAGNOSIS — M5481 Occipital neuralgia: Secondary | ICD-10-CM | POA: Diagnosis not present

## 2019-10-07 DIAGNOSIS — M159 Polyosteoarthritis, unspecified: Secondary | ICD-10-CM | POA: Diagnosis not present

## 2019-10-07 DIAGNOSIS — M5136 Other intervertebral disc degeneration, lumbar region: Secondary | ICD-10-CM | POA: Diagnosis not present

## 2019-10-07 DIAGNOSIS — F0390 Unspecified dementia without behavioral disturbance: Secondary | ICD-10-CM | POA: Diagnosis not present

## 2019-10-07 DIAGNOSIS — M48062 Spinal stenosis, lumbar region with neurogenic claudication: Secondary | ICD-10-CM | POA: Diagnosis not present

## 2019-10-07 DIAGNOSIS — M5030 Other cervical disc degeneration, unspecified cervical region: Secondary | ICD-10-CM | POA: Diagnosis not present

## 2019-10-07 DIAGNOSIS — I119 Hypertensive heart disease without heart failure: Secondary | ICD-10-CM | POA: Diagnosis not present

## 2019-10-07 DIAGNOSIS — G894 Chronic pain syndrome: Secondary | ICD-10-CM | POA: Diagnosis not present

## 2019-10-07 DIAGNOSIS — M5441 Lumbago with sciatica, right side: Secondary | ICD-10-CM | POA: Diagnosis not present

## 2019-10-10 ENCOUNTER — Other Ambulatory Visit: Payer: Self-pay | Admitting: Family Medicine

## 2019-10-10 DIAGNOSIS — M48062 Spinal stenosis, lumbar region with neurogenic claudication: Secondary | ICD-10-CM | POA: Diagnosis not present

## 2019-10-10 DIAGNOSIS — M5481 Occipital neuralgia: Secondary | ICD-10-CM | POA: Diagnosis not present

## 2019-10-10 DIAGNOSIS — F0390 Unspecified dementia without behavioral disturbance: Secondary | ICD-10-CM | POA: Diagnosis not present

## 2019-10-10 DIAGNOSIS — I10 Essential (primary) hypertension: Secondary | ICD-10-CM

## 2019-10-10 DIAGNOSIS — M5030 Other cervical disc degeneration, unspecified cervical region: Secondary | ICD-10-CM | POA: Diagnosis not present

## 2019-10-10 DIAGNOSIS — M159 Polyosteoarthritis, unspecified: Secondary | ICD-10-CM

## 2019-10-10 DIAGNOSIS — G894 Chronic pain syndrome: Secondary | ICD-10-CM | POA: Diagnosis not present

## 2019-10-10 DIAGNOSIS — M5441 Lumbago with sciatica, right side: Secondary | ICD-10-CM | POA: Diagnosis not present

## 2019-10-10 DIAGNOSIS — M5136 Other intervertebral disc degeneration, lumbar region: Secondary | ICD-10-CM | POA: Diagnosis not present

## 2019-10-10 DIAGNOSIS — I119 Hypertensive heart disease without heart failure: Secondary | ICD-10-CM | POA: Diagnosis not present

## 2019-10-10 NOTE — Telephone Encounter (Signed)
Pt asked for refill  Pt would like to be reached at [phone]  Medication Refill:  Oxycodone   Pharmacy:  [facility] [place], [facility] Phone:  [phone]  Fax:  [phone]

## 2019-10-11 DIAGNOSIS — I119 Hypertensive heart disease without heart failure: Secondary | ICD-10-CM | POA: Diagnosis not present

## 2019-10-11 DIAGNOSIS — M48062 Spinal stenosis, lumbar region with neurogenic claudication: Secondary | ICD-10-CM | POA: Diagnosis not present

## 2019-10-11 DIAGNOSIS — M5030 Other cervical disc degeneration, unspecified cervical region: Secondary | ICD-10-CM | POA: Diagnosis not present

## 2019-10-11 DIAGNOSIS — M159 Polyosteoarthritis, unspecified: Secondary | ICD-10-CM | POA: Diagnosis not present

## 2019-10-11 DIAGNOSIS — M5441 Lumbago with sciatica, right side: Secondary | ICD-10-CM | POA: Diagnosis not present

## 2019-10-11 DIAGNOSIS — G894 Chronic pain syndrome: Secondary | ICD-10-CM | POA: Diagnosis not present

## 2019-10-11 DIAGNOSIS — F0390 Unspecified dementia without behavioral disturbance: Secondary | ICD-10-CM | POA: Diagnosis not present

## 2019-10-11 DIAGNOSIS — M5481 Occipital neuralgia: Secondary | ICD-10-CM | POA: Diagnosis not present

## 2019-10-11 DIAGNOSIS — M5136 Other intervertebral disc degeneration, lumbar region: Secondary | ICD-10-CM | POA: Diagnosis not present

## 2019-10-14 DIAGNOSIS — F0390 Unspecified dementia without behavioral disturbance: Secondary | ICD-10-CM | POA: Diagnosis not present

## 2019-10-14 DIAGNOSIS — M5481 Occipital neuralgia: Secondary | ICD-10-CM | POA: Diagnosis not present

## 2019-10-14 DIAGNOSIS — M5441 Lumbago with sciatica, right side: Secondary | ICD-10-CM | POA: Diagnosis not present

## 2019-10-14 DIAGNOSIS — I119 Hypertensive heart disease without heart failure: Secondary | ICD-10-CM | POA: Diagnosis not present

## 2019-10-14 DIAGNOSIS — G894 Chronic pain syndrome: Secondary | ICD-10-CM | POA: Diagnosis not present

## 2019-10-14 DIAGNOSIS — M159 Polyosteoarthritis, unspecified: Secondary | ICD-10-CM | POA: Diagnosis not present

## 2019-10-14 DIAGNOSIS — M5030 Other cervical disc degeneration, unspecified cervical region: Secondary | ICD-10-CM | POA: Diagnosis not present

## 2019-10-14 DIAGNOSIS — M5136 Other intervertebral disc degeneration, lumbar region: Secondary | ICD-10-CM | POA: Diagnosis not present

## 2019-10-14 DIAGNOSIS — M48062 Spinal stenosis, lumbar region with neurogenic claudication: Secondary | ICD-10-CM | POA: Diagnosis not present

## 2019-10-15 MED ORDER — DULOXETINE HCL 60 MG PO CPEP: 60.0000 mg | ORAL_CAPSULE | Freq: Every day | ORAL | 2 refills | Status: DC

## 2019-10-15 MED ORDER — LOSARTAN POTASSIUM 50 MG PO TABS: 50.0000 mg | ORAL_TABLET | Freq: Every day | ORAL | 2 refills | Status: DC

## 2019-10-15 NOTE — Telephone Encounter (Signed)
Pt's sister stated she called the pharmacy for refills but they told her to call PCP  Medication:  Oxycodone Cymbalta [facility] [place], [facility] Phone:  [phone]  Fax:  [phone]

## 2019-10-15 NOTE — Telephone Encounter (Addendum)
Pt's sister stated the pt got all the refills other than the Oxycodone. They are asking for a refill on that along with an inhaler. She stated the pt legs and ankles are swollen however she hasnt seen him so she is not entirely sure but that is the info he gave her.   [name] [phone]

## 2019-10-15 NOTE — Addendum Note (Signed)
Addended by: Nathanial Millman E on: 10/15/2019 11:48 AM   Modules accepted: Orders

## 2019-10-16 ENCOUNTER — Other Ambulatory Visit: Payer: Self-pay

## 2019-10-16 ENCOUNTER — Ambulatory Visit (INDEPENDENT_AMBULATORY_CARE_PROVIDER_SITE_OTHER): Payer: Medicare HMO | Admitting: Family Medicine

## 2019-10-16 ENCOUNTER — Encounter: Payer: Self-pay | Admitting: Family Medicine

## 2019-10-16 VITALS — BP 120/70 | HR 98 | Resp 16 | Ht 70.0 in

## 2019-10-16 DIAGNOSIS — R600 Localized edema: Secondary | ICD-10-CM

## 2019-10-16 DIAGNOSIS — M48062 Spinal stenosis, lumbar region with neurogenic claudication: Secondary | ICD-10-CM | POA: Diagnosis not present

## 2019-10-16 DIAGNOSIS — M5481 Occipital neuralgia: Secondary | ICD-10-CM | POA: Diagnosis not present

## 2019-10-16 DIAGNOSIS — I119 Hypertensive heart disease without heart failure: Secondary | ICD-10-CM | POA: Diagnosis not present

## 2019-10-16 DIAGNOSIS — I10 Essential (primary) hypertension: Secondary | ICD-10-CM

## 2019-10-16 DIAGNOSIS — F0390 Unspecified dementia without behavioral disturbance: Secondary | ICD-10-CM | POA: Diagnosis not present

## 2019-10-16 DIAGNOSIS — R079 Chest pain, unspecified: Secondary | ICD-10-CM

## 2019-10-16 DIAGNOSIS — M159 Polyosteoarthritis, unspecified: Secondary | ICD-10-CM

## 2019-10-16 DIAGNOSIS — I739 Peripheral vascular disease, unspecified: Secondary | ICD-10-CM | POA: Diagnosis not present

## 2019-10-16 DIAGNOSIS — M5441 Lumbago with sciatica, right side: Secondary | ICD-10-CM | POA: Diagnosis not present

## 2019-10-16 DIAGNOSIS — G894 Chronic pain syndrome: Secondary | ICD-10-CM | POA: Diagnosis not present

## 2019-10-16 DIAGNOSIS — R296 Repeated falls: Secondary | ICD-10-CM

## 2019-10-16 DIAGNOSIS — M5030 Other cervical disc degeneration, unspecified cervical region: Secondary | ICD-10-CM | POA: Diagnosis not present

## 2019-10-16 DIAGNOSIS — M5136 Other intervertebral disc degeneration, lumbar region: Secondary | ICD-10-CM | POA: Diagnosis not present

## 2019-10-16 MED ORDER — FUROSEMIDE 20 MG PO TABS: ORAL_TABLET | ORAL | 3 refills | Status: DC

## 2019-10-16 MED ORDER — ROSUVASTATIN CALCIUM 10 MG PO TABS: 10.0000 mg | ORAL_TABLET | Freq: Every day | ORAL | 1 refills | Status: DC

## 2019-10-16 MED ORDER — POTASSIUM CHLORIDE CRYS ER 20 MEQ PO TBCR: 20.0000 meq | EXTENDED_RELEASE_TABLET | Freq: Every day | ORAL | 3 refills | Status: DC | PRN

## 2019-10-16 MED ORDER — OXYCODONE HCL 10 MG PO TABS: 10.0000 mg | ORAL_TABLET | Freq: Two times a day (BID) | ORAL | 0 refills | Status: DC | PRN

## 2019-10-16 NOTE — Progress Notes (Signed)
HPI: [name] is a 79 y.o. male, who is here today with her his friend to follow on recent hospitalization. He was last seen on 07/16/19. Since his last visit he was hospitalized.  He was admitted from 09/19/19 to 09/22/2019. According to his friend, he was discharged to SNF and back hone 10-14 days ago.  TCM 09/23/19 and 09/25/19, not able to reach pt. Taken to the ER via EMS after falling on his neighbor's yard. He was found on his back smoking a cigarette.  AKI: He denies gross hematuria, foam in urine,or decreased urine output. HCTZ was held due to dehydration.  Lab Results  Component Value Date   CREATININE 1.07 09/22/2019   BUN 25 (H) 09/22/2019   NA 137 09/22/2019   K 3.9 09/22/2019   CL 105 09/22/2019   CO2 23 09/22/2019   Lab Results  Component Value Date   WBC 9.5 09/22/2019   HGB 12.6 (L) 09/22/2019   HCT 38.4 (L) 09/22/2019   MCV 81.5 09/22/2019   PLT 236 09/22/2019   Head CT on 09/19/19 1. No acute intracranial abnormality. 2. Chronic small vessel disease.  Chronic pain: Generalized OA,cervicalgia, and spinal stenosis with neurogenic claudication. He has been on Oxycodone for several years, was following with pain management until provider was not longer in his network. Initially he was on Oxycodone tid, dose decreased to bid. Oxycodone 10 mg was d/c because thought to be the main cause for recurrent falls. He is on Duloxetine 60 mg daily.  He has hx of frequent falls, most have happened on his driveway, steep. Usually he lost balance while walking up or getting off of his car, backwards. He has a walker and cane.  His son moved with him a few months ago, but he is "in and out", hardly ever home.  His friend lives close by and tries to help with his care. She is upset because he has LE edema getting worse since hospital discharge. Negative for worsening pain or erythema. It is worse at the end of the day. In the past he has refused  diuretics because aggravated urinary frequency.  She also thinks he needs a 4 legs cane, which has been prescribed in the past and has not helped with fall prevention. He has had PT at home a few times.  HTN: He is on Losartan 50 mg daily. Negative for headache,visual changes,palpitations,orthopnea,or PND.  He is not checking BP at home. Today he mentions SOB exacerbated by walking up a hill (driveway) and associated left-sided chest pain. Pain is alleviated after a few minutes of rest. States that he has had this pain for a while, he doe snot remember if he mentioned this during hospitalization. It seems to be stable. Last EKG on  09/19/19: SR,RBBB,LAFB. Can not compare with EKG done in 05/2019, a lot of artefact.  COPD (emphysema), he denies associated cough, his friend has noted occasional wheezing. Resumed smoking right after SNF discharge.  PAD: He stopped statin months ago, does not recall side effects. 12/2017 ABI:  Right:  Resting right ankle-brachial index indicates mild right lower extremity arterial disease.  The right toe-brachial index is abnormal.  Left:  Resting left ankle-brachial index indicates mild left lower extremity arterial disease.  The left toe-brachial index is abnormal.  He is on Aspirin 81 mg daily.  Review of Systems  Constitutional: Positive for activity change and fatigue. Negative for appetite change and fever.  HENT: Negative for nosebleeds and sore throat.  Gastrointestinal: Positive for constipation (Chronic). Negative for abdominal pain, nausea and vomiting.  Endocrine: Negative for cold intolerance and heat intolerance.  Genitourinary: Negative for difficulty urinating and dysuria.  Musculoskeletal: Positive for arthralgias, back pain and gait problem.  Neurological: Negative for syncope and facial asymmetry.  Psychiatric/Behavioral: Negative for confusion and hallucinations.  Rest see pertinent positives and negatives per HPI.  Current  Outpatient Medications on File Prior to Visit  Medication Sig Dispense Refill  . DULoxetine (CYMBALTA) 60 MG capsule Take 1 capsule (60 mg total) by mouth daily. 90 capsule 2  . losartan (COZAAR) 50 MG tablet Take 1 tablet (50 mg total) by mouth daily. 90 tablet 2  . nicotine (NICODERM CQ - DOSED IN MG/24 HOURS) 21 mg/24hr patch Place 1 patch (21 mg total) onto the skin daily. 28 patch 0  . polyethylene glycol (MIRALAX / GLYCOLAX) 17 g packet Take 34 g by mouth 2 (two) times daily.  0   No current facility-administered medications on file prior to visit.   Past Medical History:  Diagnosis Date  . Back pain   . Renal disorder    kidney stones  . Spinal stenosis, lumbar region, with neurogenic claudication 07/07/2014   No Known Allergies  Social History   Socioeconomic History  . Marital status: Widowed    Spouse name: Not on file  . Number of children: Not on file  . Years of education: Not on file  . Highest education level: Not on file  Occupational History  . Not on file  Tobacco Use  . Smoking status: Current Every Day Smoker    Packs/day: 0.75    Types: Cigarettes  . Smokeless tobacco: Never Used  Vaping Use  . Vaping Use: Never used  Substance and Sexual Activity  . Alcohol use: No  . Drug use: Never  . Sexual activity: Not on file  Other Topics Concern  . Not on file  Social History Narrative  . Not on file   Social Determinants of Health   Financial Resource Strain:   . Difficulty of Paying Living Expenses: Not on file  Food Insecurity:   . Worried About Charity fundraiser in the Last Year: Not on file  . Ran Out of Food in the Last Year: Not on file  Transportation Needs:   . Lack of Transportation (Medical): Not on file  . Lack of Transportation (Non-Medical): Not on file  Physical Activity:   . Days of Exercise per Week: Not on file  . Minutes of Exercise per Session: Not on file  Stress:   . Feeling of Stress : Not on file  Social Connections:   .  Frequency of Communication with Friends and Family: Not on file  . Frequency of Social Gatherings with Friends and Family: Not on file  . Attends Religious Services: Not on file  . Active Member of Clubs or Organizations: Not on file  . Attends Archivist Meetings: Not on file  . Marital Status: Not on file    Vitals:   10/16/19 1553  BP: 120/70  Pulse: 98  Resp: 16  SpO2: 96%   Body mass index is 18.65 kg/m.  Physical Exam Nursing note reviewed.  Constitutional:      General: He is not in acute distress.    Appearance: He is well-developed.  HENT:     Head: Normocephalic and atraumatic.     Mouth/Throat:     Mouth: Mucous membranes are moist.  Pharynx: Oropharynx is clear.  Eyes:     Conjunctiva/sclera: Conjunctivae normal.  Cardiovascular:     Rate and Rhythm: Normal rate and regular rhythm.     Heart sounds: No murmur heard.      Comments: PT pulses present bilateral. Pulmonary:     Effort: Pulmonary effort is normal. No respiratory distress.     Breath sounds: Normal breath sounds.  Abdominal:     Palpations: Abdomen is soft. There is no hepatomegaly or mass.     Tenderness: There is no abdominal tenderness.  Musculoskeletal:     Cervical back: No bony tenderness. Decreased range of motion.     Right knee: Decreased range of motion.     Left knee: Decreased range of motion.     Right lower leg: 3+ Pitting Edema present.     Left lower leg: 3+ Pitting Edema present.  Skin:    General: Skin is warm.     Findings: No erythema or rash.  Neurological:     General: No focal deficit present.     Mental Status: He is alert. He is disoriented.     Comments: In a wheel chair. 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  Psychiatric:        Mood and Affect: Mood and affect normal.   ASSESSMENT AND PLAN:  Mr.Dareld was seen today for hospitalization follow-up.  Diagnoses and all orders for this  visit:  Chest pain, unspecified type He is not symptomatic at this time. COPD and deconditioning could also be contributing factors to DOE. Clearly instructed about warning signs. Cardio appt will be arranged.  Generalized osteoarthritis of multiple sites We discussed side effects of medications. Due to CVD I do not recommend NSAID's. Certainly Oxycodone and in less degree Duloxetine increase risk for falls; LE pain also does. He feels like pain medication improves quality of life. He understands risks.  -     Oxycodone HCl 10 MG TABS; Take 1 tablet (10 mg total) by mouth 2 (two) times daily as needed (for moderate - severe pain).  Spinal stenosis, lumbar region, with neurogenic claudication Also contributing factor for falls.  -     Oxycodone HCl 10 MG TABS; Take 1 tablet (10 mg total) by mouth 2 (two) times daily as needed (for moderate - severe pain).  Hypertension, essential, benign BP adequately controlled. No changes in current management. Low salt diet. Try yo monitor BP at home.  PAD (peripheral artery disease) ([place]) He agrees with resuming statin, Crestor 10 mg daily. Continue Aspirin 81 mg daily. Side effects of medications discussed.  Bilateral lower extremity edema This is a chronic problem. ? Vein disease. Compression stocking will help but he can not put them on. Appropriate skin care. After discussion of side effects, he agrees with short course of Furosemide 40 mg , then 20 mg daily prn. K+ has been on lower normal range, so instructed to take K+ supplementation with Furosemide. LE elevation above waist level.  -     furosemide (LASIX) 20 MG tablet; 2 tabs daily for 5 days then 1-2 tabs daily as needed for leg swelling.  Frequent falls Several risk factors: Medications,OA,poor balance,and his driveway. Recommend moving to assisted living, his friend will help with applications.  Fall precautions discussed. He has a wheel chair, manual, he has hand OA.   Continue with walker.   Spent 43 minutes with pt. He is a poor historian, his friend helped with interrogation, mildly  disoriented today but not in acute distress. During this time hx was obtained and documented, examination performed,recent labs/imaging reviewed , differential dx and plan discussed.  He lives in [place], still driving,because safety issue I think he should have PCP closer home. His friend's pcp is 3 blocks from his house and she is thinking about asking her PCP to see him.  Rajinder Mesick G. [name], MD  [facility]. [place] office.  A few things to remember from today's visit:   Chest pain, unspecified type - Plan: Ambulatory referral to Cardiology  Generalized osteoarthritis of multiple sites  Spinal stenosis, lumbar region, with neurogenic claudication  Hypertension, essential, benign  PAD (peripheral artery disease) ([facility])  Bilateral lower extremity edema  If you need refills please call your pharmacy. Do not use My Chart to request refills or for acute issues that need immediate attention.    Please be sure medication list is accurate. If a new problem present, please set up appointment sooner than planned today.  Please apply for assisted living community. A doctor closer home will help. Fall precautions. Crestor and pain med started today. Fluid pill with potassium, take them daily for 5-7 days then as needed.

## 2019-10-16 NOTE — Patient Instructions (Signed)
A few things to remember from today's visit:   Chest pain, unspecified type - Plan: Ambulatory referral to Cardiology  Generalized osteoarthritis of multiple sites  Spinal stenosis, lumbar region, with neurogenic claudication  Hypertension, essential, benign  PAD (peripheral artery disease) (HCC)  Bilateral lower extremity edema  If you need refills please call your pharmacy. Do not use My Chart to request refills or for acute issues that need immediate attention.    Please be sure medication list is accurate. If a new problem present, please set up appointment sooner than planned today.  Please apply for assisted living community. A doctor closer home will help. Fall precautions. Crestor and pain med started today. Fluid pill with potassium, take them daily for 5-7 days then as needed.

## 2019-10-17 DIAGNOSIS — M5136 Other intervertebral disc degeneration, lumbar region: Secondary | ICD-10-CM | POA: Diagnosis not present

## 2019-10-17 DIAGNOSIS — M5441 Lumbago with sciatica, right side: Secondary | ICD-10-CM | POA: Diagnosis not present

## 2019-10-17 DIAGNOSIS — I119 Hypertensive heart disease without heart failure: Secondary | ICD-10-CM | POA: Diagnosis not present

## 2019-10-17 DIAGNOSIS — G894 Chronic pain syndrome: Secondary | ICD-10-CM | POA: Diagnosis not present

## 2019-10-17 DIAGNOSIS — F0390 Unspecified dementia without behavioral disturbance: Secondary | ICD-10-CM | POA: Diagnosis not present

## 2019-10-17 DIAGNOSIS — M159 Polyosteoarthritis, unspecified: Secondary | ICD-10-CM | POA: Diagnosis not present

## 2019-10-17 DIAGNOSIS — M5030 Other cervical disc degeneration, unspecified cervical region: Secondary | ICD-10-CM | POA: Diagnosis not present

## 2019-10-17 DIAGNOSIS — M5481 Occipital neuralgia: Secondary | ICD-10-CM | POA: Diagnosis not present

## 2019-10-17 DIAGNOSIS — M48062 Spinal stenosis, lumbar region with neurogenic claudication: Secondary | ICD-10-CM | POA: Diagnosis not present

## 2019-10-18 DIAGNOSIS — M5441 Lumbago with sciatica, right side: Secondary | ICD-10-CM | POA: Diagnosis not present

## 2019-10-18 DIAGNOSIS — M5136 Other intervertebral disc degeneration, lumbar region: Secondary | ICD-10-CM | POA: Diagnosis not present

## 2019-10-18 DIAGNOSIS — F0390 Unspecified dementia without behavioral disturbance: Secondary | ICD-10-CM | POA: Diagnosis not present

## 2019-10-18 DIAGNOSIS — M48062 Spinal stenosis, lumbar region with neurogenic claudication: Secondary | ICD-10-CM | POA: Diagnosis not present

## 2019-10-18 DIAGNOSIS — G894 Chronic pain syndrome: Secondary | ICD-10-CM | POA: Diagnosis not present

## 2019-10-18 DIAGNOSIS — M159 Polyosteoarthritis, unspecified: Secondary | ICD-10-CM | POA: Diagnosis not present

## 2019-10-18 DIAGNOSIS — M5030 Other cervical disc degeneration, unspecified cervical region: Secondary | ICD-10-CM | POA: Diagnosis not present

## 2019-10-18 DIAGNOSIS — M5481 Occipital neuralgia: Secondary | ICD-10-CM | POA: Diagnosis not present

## 2019-10-18 DIAGNOSIS — I119 Hypertensive heart disease without heart failure: Secondary | ICD-10-CM | POA: Diagnosis not present

## 2019-10-21 DIAGNOSIS — I119 Hypertensive heart disease without heart failure: Secondary | ICD-10-CM | POA: Diagnosis not present

## 2019-10-21 DIAGNOSIS — M5030 Other cervical disc degeneration, unspecified cervical region: Secondary | ICD-10-CM | POA: Diagnosis not present

## 2019-10-21 DIAGNOSIS — G894 Chronic pain syndrome: Secondary | ICD-10-CM | POA: Diagnosis not present

## 2019-10-21 DIAGNOSIS — M5441 Lumbago with sciatica, right side: Secondary | ICD-10-CM | POA: Diagnosis not present

## 2019-10-21 DIAGNOSIS — F0390 Unspecified dementia without behavioral disturbance: Secondary | ICD-10-CM | POA: Diagnosis not present

## 2019-10-21 DIAGNOSIS — M5481 Occipital neuralgia: Secondary | ICD-10-CM | POA: Diagnosis not present

## 2019-10-21 DIAGNOSIS — M48062 Spinal stenosis, lumbar region with neurogenic claudication: Secondary | ICD-10-CM | POA: Diagnosis not present

## 2019-10-21 DIAGNOSIS — M159 Polyosteoarthritis, unspecified: Secondary | ICD-10-CM | POA: Diagnosis not present

## 2019-10-21 DIAGNOSIS — M5136 Other intervertebral disc degeneration, lumbar region: Secondary | ICD-10-CM | POA: Diagnosis not present

## 2019-10-22 DIAGNOSIS — M5136 Other intervertebral disc degeneration, lumbar region: Secondary | ICD-10-CM | POA: Diagnosis not present

## 2019-10-22 DIAGNOSIS — M5481 Occipital neuralgia: Secondary | ICD-10-CM | POA: Diagnosis not present

## 2019-10-22 DIAGNOSIS — M159 Polyosteoarthritis, unspecified: Secondary | ICD-10-CM | POA: Diagnosis not present

## 2019-10-22 DIAGNOSIS — M5441 Lumbago with sciatica, right side: Secondary | ICD-10-CM | POA: Diagnosis not present

## 2019-10-22 DIAGNOSIS — M48062 Spinal stenosis, lumbar region with neurogenic claudication: Secondary | ICD-10-CM | POA: Diagnosis not present

## 2019-10-22 DIAGNOSIS — F0390 Unspecified dementia without behavioral disturbance: Secondary | ICD-10-CM | POA: Diagnosis not present

## 2019-10-22 DIAGNOSIS — M5030 Other cervical disc degeneration, unspecified cervical region: Secondary | ICD-10-CM | POA: Diagnosis not present

## 2019-10-22 DIAGNOSIS — I119 Hypertensive heart disease without heart failure: Secondary | ICD-10-CM | POA: Diagnosis not present

## 2019-10-22 DIAGNOSIS — G894 Chronic pain syndrome: Secondary | ICD-10-CM | POA: Diagnosis not present

## 2019-10-23 DIAGNOSIS — M5136 Other intervertebral disc degeneration, lumbar region: Secondary | ICD-10-CM | POA: Diagnosis not present

## 2019-10-23 DIAGNOSIS — M5030 Other cervical disc degeneration, unspecified cervical region: Secondary | ICD-10-CM | POA: Diagnosis not present

## 2019-10-23 DIAGNOSIS — G894 Chronic pain syndrome: Secondary | ICD-10-CM | POA: Diagnosis not present

## 2019-10-23 DIAGNOSIS — M5441 Lumbago with sciatica, right side: Secondary | ICD-10-CM | POA: Diagnosis not present

## 2019-10-23 DIAGNOSIS — M48062 Spinal stenosis, lumbar region with neurogenic claudication: Secondary | ICD-10-CM | POA: Diagnosis not present

## 2019-10-23 DIAGNOSIS — I119 Hypertensive heart disease without heart failure: Secondary | ICD-10-CM | POA: Diagnosis not present

## 2019-10-23 DIAGNOSIS — M159 Polyosteoarthritis, unspecified: Secondary | ICD-10-CM | POA: Diagnosis not present

## 2019-10-23 DIAGNOSIS — F0390 Unspecified dementia without behavioral disturbance: Secondary | ICD-10-CM | POA: Diagnosis not present

## 2019-10-23 DIAGNOSIS — M5481 Occipital neuralgia: Secondary | ICD-10-CM | POA: Diagnosis not present

## 2019-10-28 ENCOUNTER — Telehealth: Payer: Self-pay | Admitting: Family Medicine

## 2019-10-28 DIAGNOSIS — F0390 Unspecified dementia without behavioral disturbance: Secondary | ICD-10-CM | POA: Diagnosis not present

## 2019-10-28 DIAGNOSIS — M5030 Other cervical disc degeneration, unspecified cervical region: Secondary | ICD-10-CM | POA: Diagnosis not present

## 2019-10-28 DIAGNOSIS — M48062 Spinal stenosis, lumbar region with neurogenic claudication: Secondary | ICD-10-CM | POA: Diagnosis not present

## 2019-10-28 DIAGNOSIS — G894 Chronic pain syndrome: Secondary | ICD-10-CM | POA: Diagnosis not present

## 2019-10-28 DIAGNOSIS — I119 Hypertensive heart disease without heart failure: Secondary | ICD-10-CM | POA: Diagnosis not present

## 2019-10-28 DIAGNOSIS — M5441 Lumbago with sciatica, right side: Secondary | ICD-10-CM | POA: Diagnosis not present

## 2019-10-28 DIAGNOSIS — M159 Polyosteoarthritis, unspecified: Secondary | ICD-10-CM | POA: Diagnosis not present

## 2019-10-28 DIAGNOSIS — M5481 Occipital neuralgia: Secondary | ICD-10-CM | POA: Diagnosis not present

## 2019-10-28 DIAGNOSIS — M5136 Other intervertebral disc degeneration, lumbar region: Secondary | ICD-10-CM | POA: Diagnosis not present

## 2019-10-28 NOTE — Telephone Encounter (Signed)
[name] [name] from [facility] called to let Dr. [name] know that the patients BP is elevated and his pulse is slightly low and he just doesn't look right today and would like for the medical assistant to call and check on the patient today.  Denice Bors [place]) his sister is on the way to his house for you to contact her at [phone].  Please advise

## 2019-10-28 NOTE — Telephone Encounter (Signed)
[name] [name] [phone] from [facility] is needing verbal orders for OT  1 week 1 2 weeks 1 1 week 1  Please advise

## 2019-10-28 NOTE — Telephone Encounter (Signed)
I called and spoke with pt's sister. She is on her way over to his house, she will let us know how he is once she is there.

## 2019-10-28 NOTE — Telephone Encounter (Signed)
Dr [name] wanted to know how pt was doing--  [name] [name], pt's sister wants a call back

## 2019-10-28 NOTE — Telephone Encounter (Signed)
See other phone note

## 2019-10-28 NOTE — Telephone Encounter (Signed)
I spoke with [facility]. Pt seems to be doing okay, she brought him lunch and ate with him. He was talking fine. Pt has some dizziness, she isn't sure what his bp was when [facility] was out. No chest pain, no headaches. Pt will continue to push fluids and resting. They will let us know if he gets any worse & [facility] will continue to check his bp.

## 2019-10-28 NOTE — Telephone Encounter (Signed)
I left a voicemail for [name] [name] letting her know the verbal is okay & to call back with any questions.

## 2019-10-29 NOTE — Telephone Encounter (Signed)
I spoke with OT. They will check his blood pressure every visit & report it back to [place].

## 2019-10-29 NOTE — Telephone Encounter (Signed)
Continue monitoring for changes. I would like BP check when he is having this dizzy episodes. Some of his meds can aggravate problem. Fall precautions to continue. Thanks, BJ

## 2019-11-01 ENCOUNTER — Telehealth: Payer: Self-pay | Admitting: Family Medicine

## 2019-11-01 ENCOUNTER — Emergency Department: Payer: Medicare HMO

## 2019-11-01 ENCOUNTER — Inpatient Hospital Stay
Admission: EM | Admit: 2019-11-01 | Discharge: 2019-11-07 | DRG: 917 | Disposition: A | Discharge status: Skilled Nursing Facility | Payer: Medicare HMO | Attending: Internal Medicine | Admitting: Internal Medicine

## 2019-11-01 DIAGNOSIS — I6389 Other cerebral infarction: Secondary | ICD-10-CM | POA: Diagnosis not present

## 2019-11-01 DIAGNOSIS — G8929 Other chronic pain: Secondary | ICD-10-CM | POA: Diagnosis present

## 2019-11-01 DIAGNOSIS — I10 Essential (primary) hypertension: Secondary | ICD-10-CM | POA: Diagnosis not present

## 2019-11-01 DIAGNOSIS — M48062 Spinal stenosis, lumbar region with neurogenic claudication: Secondary | ICD-10-CM | POA: Diagnosis present

## 2019-11-01 DIAGNOSIS — R079 Chest pain, unspecified: Secondary | ICD-10-CM | POA: Diagnosis not present

## 2019-11-01 DIAGNOSIS — R9431 Abnormal electrocardiogram [ECG] [EKG]: Secondary | ICD-10-CM | POA: Diagnosis not present

## 2019-11-01 DIAGNOSIS — E43 Unspecified severe protein-calorie malnutrition: Secondary | ICD-10-CM | POA: Diagnosis not present

## 2019-11-01 DIAGNOSIS — E440 Moderate protein-calorie malnutrition: Secondary | ICD-10-CM | POA: Diagnosis not present

## 2019-11-01 DIAGNOSIS — M5441 Lumbago with sciatica, right side: Secondary | ICD-10-CM | POA: Diagnosis not present

## 2019-11-01 DIAGNOSIS — Z87442 Personal history of urinary calculi: Secondary | ICD-10-CM

## 2019-11-01 DIAGNOSIS — T402X1A Poisoning by other opioids, accidental (unintentional), initial encounter: Principal | ICD-10-CM | POA: Diagnosis present

## 2019-11-01 DIAGNOSIS — R531 Weakness: Secondary | ICD-10-CM | POA: Diagnosis not present

## 2019-11-01 DIAGNOSIS — Z79891 Long term (current) use of opiate analgesic: Secondary | ICD-10-CM | POA: Diagnosis not present

## 2019-11-01 DIAGNOSIS — M5481 Occipital neuralgia: Secondary | ICD-10-CM | POA: Diagnosis not present

## 2019-11-01 DIAGNOSIS — E785 Hyperlipidemia, unspecified: Secondary | ICD-10-CM | POA: Diagnosis present

## 2019-11-01 DIAGNOSIS — T40601A Poisoning by unspecified narcotics, accidental (unintentional), initial encounter: Secondary | ICD-10-CM | POA: Diagnosis not present

## 2019-11-01 DIAGNOSIS — Z681 Body mass index (BMI) 19 or less, adult: Secondary | ICD-10-CM

## 2019-11-01 DIAGNOSIS — I700 Atherosclerosis of aorta: Secondary | ICD-10-CM | POA: Diagnosis present

## 2019-11-01 DIAGNOSIS — R29703 NIHSS score 3: Secondary | ICD-10-CM | POA: Diagnosis not present

## 2019-11-01 DIAGNOSIS — J3489 Other specified disorders of nose and nasal sinuses: Secondary | ICD-10-CM | POA: Diagnosis not present

## 2019-11-01 DIAGNOSIS — M5030 Other cervical disc degeneration, unspecified cervical region: Secondary | ICD-10-CM | POA: Diagnosis not present

## 2019-11-01 DIAGNOSIS — R4182 Altered mental status, unspecified: Secondary | ICD-10-CM | POA: Diagnosis not present

## 2019-11-01 DIAGNOSIS — R6520 Severe sepsis without septic shock: Secondary | ICD-10-CM | POA: Diagnosis not present

## 2019-11-01 DIAGNOSIS — F17210 Nicotine dependence, cigarettes, uncomplicated: Secondary | ICD-10-CM | POA: Diagnosis present

## 2019-11-01 DIAGNOSIS — I110 Hypertensive heart disease with heart failure: Secondary | ICD-10-CM | POA: Diagnosis not present

## 2019-11-01 DIAGNOSIS — Z20822 Contact with and (suspected) exposure to covid-19: Secondary | ICD-10-CM | POA: Diagnosis present

## 2019-11-01 DIAGNOSIS — R262 Difficulty in walking, not elsewhere classified: Secondary | ICD-10-CM | POA: Diagnosis not present

## 2019-11-01 DIAGNOSIS — I4510 Unspecified right bundle-branch block: Secondary | ICD-10-CM | POA: Diagnosis not present

## 2019-11-01 DIAGNOSIS — I6381 Other cerebral infarction due to occlusion or stenosis of small artery: Secondary | ICD-10-CM | POA: Diagnosis not present

## 2019-11-01 DIAGNOSIS — G9340 Encephalopathy, unspecified: Secondary | ICD-10-CM | POA: Diagnosis not present

## 2019-11-01 DIAGNOSIS — J181 Lobar pneumonia, unspecified organism: Secondary | ICD-10-CM | POA: Diagnosis not present

## 2019-11-01 DIAGNOSIS — R1310 Dysphagia, unspecified: Secondary | ICD-10-CM | POA: Diagnosis present

## 2019-11-01 DIAGNOSIS — E7800 Pure hypercholesterolemia, unspecified: Secondary | ICD-10-CM | POA: Diagnosis present

## 2019-11-01 DIAGNOSIS — Z833 Family history of diabetes mellitus: Secondary | ICD-10-CM | POA: Diagnosis not present

## 2019-11-01 DIAGNOSIS — I119 Hypertensive heart disease without heart failure: Secondary | ICD-10-CM | POA: Diagnosis not present

## 2019-11-01 DIAGNOSIS — A419 Sepsis, unspecified organism: Secondary | ICD-10-CM | POA: Diagnosis not present

## 2019-11-01 DIAGNOSIS — G9341 Metabolic encephalopathy: Secondary | ICD-10-CM | POA: Diagnosis present

## 2019-11-01 DIAGNOSIS — M159 Polyosteoarthritis, unspecified: Secondary | ICD-10-CM | POA: Diagnosis not present

## 2019-11-01 DIAGNOSIS — T50901A Poisoning by unspecified drugs, medicaments and biological substances, accidental (unintentional), initial encounter: Secondary | ICD-10-CM | POA: Diagnosis not present

## 2019-11-01 DIAGNOSIS — R5381 Other malaise: Secondary | ICD-10-CM | POA: Diagnosis not present

## 2019-11-01 DIAGNOSIS — R0789 Other chest pain: Secondary | ICD-10-CM | POA: Diagnosis not present

## 2019-11-01 DIAGNOSIS — R279 Unspecified lack of coordination: Secondary | ICD-10-CM | POA: Diagnosis not present

## 2019-11-01 DIAGNOSIS — J9601 Acute respiratory failure with hypoxia: Secondary | ICD-10-CM | POA: Diagnosis not present

## 2019-11-01 DIAGNOSIS — N390 Urinary tract infection, site not specified: Secondary | ICD-10-CM | POA: Diagnosis not present

## 2019-11-01 DIAGNOSIS — R404 Transient alteration of awareness: Secondary | ICD-10-CM | POA: Diagnosis not present

## 2019-11-01 DIAGNOSIS — G9389 Other specified disorders of brain: Secondary | ICD-10-CM | POA: Diagnosis not present

## 2019-11-01 DIAGNOSIS — N400 Enlarged prostate without lower urinary tract symptoms: Secondary | ICD-10-CM | POA: Diagnosis present

## 2019-11-01 DIAGNOSIS — I63411 Cerebral infarction due to embolism of right middle cerebral artery: Secondary | ICD-10-CM | POA: Diagnosis not present

## 2019-11-01 DIAGNOSIS — F0390 Unspecified dementia without behavioral disturbance: Secondary | ICD-10-CM | POA: Diagnosis not present

## 2019-11-01 DIAGNOSIS — T50904A Poisoning by unspecified drugs, medicaments and biological substances, undetermined, initial encounter: Secondary | ICD-10-CM | POA: Diagnosis not present

## 2019-11-01 DIAGNOSIS — R9082 White matter disease, unspecified: Secondary | ICD-10-CM | POA: Diagnosis not present

## 2019-11-01 DIAGNOSIS — M5136 Other intervertebral disc degeneration, lumbar region: Secondary | ICD-10-CM | POA: Diagnosis not present

## 2019-11-01 DIAGNOSIS — I639 Cerebral infarction, unspecified: Secondary | ICD-10-CM | POA: Diagnosis not present

## 2019-11-01 DIAGNOSIS — R278 Other lack of coordination: Secondary | ICD-10-CM | POA: Diagnosis not present

## 2019-11-01 DIAGNOSIS — J869 Pyothorax without fistula: Secondary | ICD-10-CM | POA: Diagnosis not present

## 2019-11-01 DIAGNOSIS — N179 Acute kidney failure, unspecified: Secondary | ICD-10-CM | POA: Diagnosis not present

## 2019-11-01 DIAGNOSIS — R52 Pain, unspecified: Secondary | ICD-10-CM | POA: Diagnosis not present

## 2019-11-01 DIAGNOSIS — G894 Chronic pain syndrome: Secondary | ICD-10-CM | POA: Diagnosis not present

## 2019-11-01 DIAGNOSIS — I63233 Cerebral infarction due to unspecified occlusion or stenosis of bilateral carotid arteries: Secondary | ICD-10-CM | POA: Diagnosis not present

## 2019-11-01 DIAGNOSIS — M6281 Muscle weakness (generalized): Secondary | ICD-10-CM | POA: Diagnosis not present

## 2019-11-01 LAB — CBC WITH DIFFERENTIAL/PLATELET
Abs Immature Granulocytes: 0.04 10*3/uL (ref 0.00–0.07)
Basophils Absolute: 0.1 10*3/uL (ref 0.0–0.1)
Basophils Relative: 1 %
Eosinophils Absolute: 0.2 10*3/uL (ref 0.0–0.5)
Eosinophils Relative: 2 %
HCT: 44.2 % (ref 39.0–52.0)
Hemoglobin: 13.6 g/dL (ref 13.0–17.0)
Immature Granulocytes: 0 %
Lymphocytes Relative: 21 %
Lymphs Abs: 2 10*3/uL (ref 0.7–4.0)
MCH: 26.3 pg (ref 26.0–34.0)
MCHC: 30.8 g/dL (ref 30.0–36.0)
MCV: 85.5 fL (ref 80.0–100.0)
Monocytes Absolute: 0.8 10*3/uL (ref 0.1–1.0)
Monocytes Relative: 9 %
Neutro Abs: 6.2 10*3/uL (ref 1.7–7.7)
Neutrophils Relative %: 67 %
Platelets: 268 10*3/uL (ref 150–400)
RBC: 5.17 MIL/uL (ref 4.22–5.81)
RDW: 15.7 % — ABNORMAL HIGH (ref 11.5–15.5)
WBC: 9.2 10*3/uL (ref 4.0–10.5)
nRBC: 0 % (ref 0.0–0.2)

## 2019-11-01 LAB — ETHANOL: Alcohol, Ethyl (B): <10 mg/dL (ref <10)

## 2019-11-01 LAB — COMPREHENSIVE METABOLIC PANEL
ALT: 19 U/L (ref 0–44)
AST: 26 U/L (ref 15–41)
Albumin: 4.2 g/dL (ref 3.5–5.0)
Alkaline Phosphatase: 107 U/L (ref 38–126)
Anion gap: 8 (ref 5–15)
BUN: 16 mg/dL (ref 8–23)
CO2: 28 mmol/L (ref 22–32)
Calcium: 9.2 mg/dL (ref 8.9–10.3)
Chloride: 102 mmol/L (ref 98–111)
Creatinine, Ser: 1.29 mg/dL — ABNORMAL HIGH (ref 0.61–1.24)
GFR calc Af Amer: >60 mL/min (ref >60)
GFR calc non Af Amer: 53 mL/min — ABNORMAL LOW (ref >60)
Glucose, Bld: 89 mg/dL (ref 70–99)
Potassium: 4.7 mmol/L (ref 3.5–5.1)
Sodium: 138 mmol/L (ref 135–145)
Total Bilirubin: 1 mg/dL (ref 0.3–1.2)
Total Protein: 7.9 g/dL (ref 6.5–8.1)

## 2019-11-01 LAB — SALICYLATE LEVEL: Salicylate Lvl: <7 mg/dL — ABNORMAL LOW (ref 7.0–30.0)

## 2019-11-01 LAB — MAGNESIUM: Magnesium: 1.9 mg/dL (ref 1.7–2.4)

## 2019-11-01 LAB — ACETAMINOPHEN LEVEL: Acetaminophen (Tylenol), Serum: <10 ug/mL — ABNORMAL LOW (ref 10–30)

## 2019-11-01 NOTE — ED Notes (Signed)
Pt with CT. 

## 2019-11-01 NOTE — Telephone Encounter (Signed)
Renne Musca is calling in with his vital signs are good Temp 98.1 HR 65 Resp 17 O2 96  BP 118/58 pt behavior has changed pt can barely walk and very lethargy and mouth is a little twisted and speech is a little slurred.  Pt decline do have EMS come out.  Son is there with him per the PT both of the will be leaving the pt very soon.

## 2019-11-01 NOTE — ED Provider Notes (Signed)
[facility] Emergency Department Provider Note  ____________________________________________   First MD Initiated Contact with Patient 11/01/19 2223     (approximate)  I have reviewed the triage vital signs and the nursing notes.   HISTORY  Chief Complaint Drug Overdose (possible medication overdose per EMS  )   HPI Issai [name] is a 79 y.o. male with a past medical history of kidney stones, spinal stenosis, and chronic back pain on opioids, BPH, and HTN who presents via EMS from home with concerns for possible overdose and further assessment of altered mental status.  Per EMS they were called out by family member who was worried patient may have accidentally taken too many of his pills as the has been developing worsening vision over last several weeks.  On EMS arrival patient was unable to answer the door after they were able to open and a fan patient slumped over wheelchair drooling not speaking.  He did have some coarse breath sounds and they gave the patient a Solu-Medrol and a breathing treatment which did not change his mental status.  No other history is immediately available although per EMS he had stable vitals in route-blood sugar within normal limits.  Shortly after patient arrival to the ED his sister did arrive at bedside who was able to relate that she did see the patient earlier today and was worried that he seemed more confused than usual and he mentioned that he was worried he may have taken too many of his medications as he had trouble reading labels.  She states the patient is normally awake and alert but has had intermittent episodes of confusion over the last several months.  There have been no recent falls or injuries.  She does not think the patient intentionally overdosed on anything.  No other history is immediately available patient arrival secondary to his altered mental status.         Past Medical History:  Diagnosis Date   . Back pain   . Renal disorder    kidney stones  . Spinal stenosis, lumbar region, with neurogenic claudication 07/07/2014    Patient Active Problem List   Diagnosis Date Noted  . Weakness 09/20/2019  . Protein-calorie malnutrition, severe 09/20/2019  . AKI (acute kidney injury) ([place]) 09/19/2019  . Ambulatory dysfunction 09/19/2019  . Dehydration 09/19/2019  . Fall at home, initial encounter 09/19/2019  . Aortic atherosclerosis ([place]) 02/16/2019  . Hypertension, essential, benign 08/13/2018  . Urinary frequency 02/05/2018  . PAD (peripheral artery disease) (HCC)-Mild 12/26/2017  . Chronic pain disorder 11/17/2017  . Unstable gait 11/17/2017  . BPH associated with nocturia 09/26/2017  . Allergic rhinitis 06/09/2017  . Tobacco use disorder 02/14/2017  . Generalized osteoarthritis of multiple sites 02/14/2017  . Bilateral lower extremity edema 08/12/2016  . Chronic right-sided low back pain with right-sided sciatica 03/14/2016  . Chronic pain of right knee 03/14/2016  . DDD (degenerative disc disease), lumbar 07/07/2014  . DDD (degenerative disc disease), cervical 07/07/2014  . DJD (degenerative joint disease) of knee 07/07/2014  . Bilateral occipital neuralgia 07/07/2014  . Spinal stenosis, lumbar region, with neurogenic claudication 07/07/2014    Past Surgical History:  Procedure Laterality Date  . CATARACT EXTRACTION W/PHACO Left 08/22/2016   Procedure: CATARACT EXTRACTION PHACO AND INTRAOCULAR LENS PLACEMENT ([place]) Left;  Surgeon: [name], MD;  Location: [place];  Service: Ophthalmology;  Laterality: Left;  . CATARACT EXTRACTION W/PHACO Right 10/25/2016   Procedure: CATARACT EXTRACTION PHACO AND INTRAOCULAR  LENS PLACEMENT (IOC) WITH ISTENT RIGHT  ;  Surgeon: [name], MD;  Location: [place];  Service: Ophthalmology;  Laterality: Right;  TOPICAL RIGHT  ISTENT   trabecular bypass stent was implanted/explanted.  . INGUINAL HERNIA  REPAIR    . KNEE SURGERY Right   . NECK SURGERY      Prior to Admission medications   Medication Sig Start Date End Date Taking? Authorizing Provider  DULoxetine (CYMBALTA) 60 MG capsule Take 1 capsule (60 mg total) by mouth daily. 10/15/19   [name], [name], MD  furosemide (LASIX) 20 MG tablet 2 tabs daily for 5 days then 1-2 tabs daily as needed for leg swelling. 10/16/19   [name], [name], MD  losartan (COZAAR) 50 MG tablet Take 1 tablet (50 mg total) by mouth daily. 10/15/19   [name], [name], MD  nicotine (NICODERM CQ - DOSED IN MG/24 HOURS) 21 mg/24hr patch Place 1 patch (21 mg total) onto the skin daily. 09/23/19   [name], MD  Oxycodone HCl 10 MG TABS Take 1 tablet (10 mg total) by mouth 2 (two) times daily as needed (for moderate - severe pain). 10/16/19   [name], [name], MD  polyethylene glycol (MIRALAX / GLYCOLAX) 17 g packet Take 34 g by mouth 2 (two) times daily. 09/22/19   [name], MD  potassium chloride SA (KLOR-CON) 20 MEQ tablet Take 1 tablet (20 mEq total) by mouth daily as needed (When taking fluid pill.). 10/16/19   [name], [name], MD  rosuvastatin (CRESTOR) 10 MG tablet Take 1 tablet (10 mg total) by mouth daily. 10/16/19   [name], [name], MD    Allergies Patient has no known allergies.  Family History  Problem Relation Age of Onset  . Diabetes Mother   . Diabetes Brother   . Diabetes Sister     Social History Social History   Tobacco Use  . Smoking status: Current Every Day Smoker    Packs/day: 0.75    Types: Cigarettes  . Smokeless tobacco: Never Used  Vaping Use  . Vaping Use: Never used  Substance Use Topics  . Alcohol use: No  . Drug use: Never    Review of Systems  Review of Systems  Unable to perform ROS: Mental status change      ____________________________________________   PHYSICAL EXAM:  VITAL SIGNS: ED Triage Vitals  Enc Vitals Group     BP      Pulse      Resp      Temp      Temp src      SpO2      Weight      Height        Head Circumference      Peak Flow      Pain Score      Pain Loc      Pain Edu?      Excl. in [place]?    Vitals:   11/01/19 2224  BP: (!) 159/91  Pulse: 67  Resp: 18  Temp: 97.8 F (36.6 C)  SpO2: 100%   Physical Exam Vitals and nursing note reviewed.  Constitutional:      Appearance: He is ill-appearing. He is not diaphoretic.  HENT:     Head: Normocephalic and atraumatic.     Right Ear: External ear normal.     Left Ear: External ear normal.     Nose: Nose normal.  Eyes:     Extraocular Movements: Extraocular  movements intact.     Conjunctiva/sclera: Conjunctivae normal.     Pupils: Pupils are equal, round, and reactive to light.  Cardiovascular:     Rate and Rhythm: Normal rate and regular rhythm.     Pulses: Normal pulses.  Pulmonary:     Effort: Pulmonary effort is normal. No respiratory distress.     Breath sounds: No wheezing.  Abdominal:     General: Abdomen is flat. There is no distension.     Tenderness: There is no abdominal tenderness.  Musculoskeletal:     Cervical back: No rigidity.     Right lower leg: Edema present.     Left lower leg: Edema present.  Skin:    General: Skin is warm.     Capillary Refill: Capillary refill takes less than 2 seconds.  Neurological:     Mental Status: He is lethargic and confused.     Patient does not participate in formal neuro exam with exception of giving this examiner thumbs up in his right hand on command.  In addition he is able to track me with his eyes.  He mumbles incomprehensible sounds and response to any questioning.  His bilateral upper extremities are hypertonic there is normal tone in his lower extremities.  No clonus.  He is not diaphoretic. ____________________________________________   LABS (all labs ordered are listed, but only abnormal results are displayed)  Labs Reviewed  RESPIRATORY PANEL BY RT PCR (FLU A&B, COVID)  CBC WITH DIFFERENTIAL/PLATELET  COMPREHENSIVE METABOLIC PANEL  MAGNESIUM   ACETAMINOPHEN LEVEL  SALICYLATE LEVEL  ETHANOL  TSH  URINE DRUG SCREEN, QUALITATIVE ([facility] ONLY)  AMMONIA  BLOOD GAS, VENOUS  TROPONIN I (HIGH SENSITIVITY)   ____________________________________________  EKG Sinus rhythm with a ventricular rate of 64, right bundle branch block, T wave inversions in inferior leads and very poor tracing overall with prolonged QRS of 162 which is the same duration it is on prior EKGs but a new prolonged QTc interval at 513.  ____________________________________________  RADIOLOGY  ED MD interpretation: No focal consolidation, pneumothorax, edema, effusion, or other clear intrathoracic abnormality.  Official radiology report(s): DG Chest 1 View  Result Date: 11/01/2019 CLINICAL DATA:  Increased weakness and altered mental status. EXAM: CHEST  1 VIEW COMPARISON:  May 03, 2019 FINDINGS: The heart size and mediastinal contours are within normal limits. Both lungs are clear. Multilevel degenerative changes seen within the mid and lower thoracic spine. IMPRESSION: No acute cardiopulmonary disease. Electronically Signed   By: Virgina Norfolk M.D.   On: 11/01/2019 22:41    ____________________________________________   PROCEDURES  Procedure(s) performed (including Critical Care):  .1-3 Lead EKG Interpretation Performed by: Lucrezia Starch, MD Authorized by: Lucrezia Starch, MD     Interpretation: normal     ECG rate assessment: normal     Rhythm: sinus rhythm     Ectopy: none     Conduction: normal       ____________________________________________   INITIAL IMPRESSION / ASSESSMENT AND PLAN / ED COURSE        Patient presents with above-stated history and exam for further assessment of altered mental status with concerns of possible unintentional overdose.  Patient is slightly hypertensive with otherwise stable vital signs on room air.  He does mumbles on exam but I am unable to obtain any history from him and he is quite confused.  He  is hypertonic in his upper extremities but otherwise has no focal deficits or nystagmus or diaphoresis.  Chest x-ray is  unremarkable.  EKG shows right bundle branch block which is present on prior EKGs but a new prolonged QTc interval.  I was able to review some of patient's daily medications which include OxyContin and duloxetine.  Is unclear if any of these are missing several of the bottles are empty and appears quite old.  In addition work-up for possible overdose loss obtain CT head, ammonia, and urine studies to assess for other etiologies for patient's altered mental status including metabolic derangements although have a low suspicion for acute traumatic injury or acute infectious process at this time.  Care of patient is assumed by Dr. Beather Arbour approximately 2300.  Plan is to follow-up above-noted studies and likely admit for further evaluation management.   ________________________________________   FINAL CLINICAL IMPRESSION(S) / ED DIAGNOSES  Final diagnoses:  Accidental drug overdose, initial encounter  Altered mental status, unspecified altered mental status type  QT prolongation  RBBB    Medications - No data to display   ED Discharge Orders    None       Note:  This document was prepared using Dragon voice recognition software and may include unintentional dictation errors.   Lucrezia Starch, MD 11/01/19 [phone]

## 2019-11-01 NOTE — Telephone Encounter (Signed)
Soni from [facility]- [phone] calling with Vitals for today:   BP-118/62 Pulse-67 Temp- 97.6 SO2- 99  Soni is concerned that patient is coming more slower, confused and need more assistance. She wants to know can Dr. [name] give her a call [phone] and also place an order for a Education officer, museum.

## 2019-11-02 ENCOUNTER — Observation Stay: Payer: Medicare HMO

## 2019-11-02 DIAGNOSIS — T50901A Poisoning by unspecified drugs, medicaments and biological substances, accidental (unintentional), initial encounter: Secondary | ICD-10-CM

## 2019-11-02 DIAGNOSIS — T40601A Poisoning by unspecified narcotics, accidental (unintentional), initial encounter: Secondary | ICD-10-CM

## 2019-11-02 DIAGNOSIS — R4182 Altered mental status, unspecified: Secondary | ICD-10-CM | POA: Diagnosis not present

## 2019-11-02 DIAGNOSIS — R9082 White matter disease, unspecified: Secondary | ICD-10-CM | POA: Diagnosis not present

## 2019-11-02 DIAGNOSIS — R262 Difficulty in walking, not elsewhere classified: Secondary | ICD-10-CM | POA: Diagnosis not present

## 2019-11-02 DIAGNOSIS — G9341 Metabolic encephalopathy: Secondary | ICD-10-CM | POA: Diagnosis not present

## 2019-11-02 DIAGNOSIS — G9389 Other specified disorders of brain: Secondary | ICD-10-CM | POA: Diagnosis not present

## 2019-11-02 DIAGNOSIS — I6381 Other cerebral infarction due to occlusion or stenosis of small artery: Secondary | ICD-10-CM | POA: Diagnosis not present

## 2019-11-02 DIAGNOSIS — J3489 Other specified disorders of nose and nasal sinuses: Secondary | ICD-10-CM | POA: Diagnosis not present

## 2019-11-02 DIAGNOSIS — I10 Essential (primary) hypertension: Secondary | ICD-10-CM

## 2019-11-02 DIAGNOSIS — M159 Polyosteoarthritis, unspecified: Secondary | ICD-10-CM | POA: Diagnosis not present

## 2019-11-02 DIAGNOSIS — N390 Urinary tract infection, site not specified: Secondary | ICD-10-CM | POA: Diagnosis present

## 2019-11-02 LAB — URINE DRUG SCREEN, QUALITATIVE (ARMC ONLY)
Amphetamines, Ur Screen: NOT DETECTED
Barbiturates, Ur Screen: NOT DETECTED
Benzodiazepine, Ur Scrn: NOT DETECTED
Cannabinoid 50 Ng, Ur ~~LOC~~: NOT DETECTED
Cocaine Metabolite,Ur ~~LOC~~: NOT DETECTED
MDMA (Ecstasy)Ur Screen: NOT DETECTED
Methadone Scn, Ur: NOT DETECTED
Opiate, Ur Screen: NOT DETECTED
Phencyclidine (PCP) Ur S: NOT DETECTED
Tricyclic, Ur Screen: NOT DETECTED

## 2019-11-02 LAB — URINALYSIS, COMPLETE (UACMP) WITH MICROSCOPIC
Bilirubin Urine: NEGATIVE
Glucose, UA: NEGATIVE mg/dL
Ketones, ur: NEGATIVE mg/dL
Leukocytes,Ua: NEGATIVE
Nitrite: NEGATIVE
Protein, ur: 100 mg/dL — AB
RBC / HPF: >50 RBC/hpf — ABNORMAL HIGH (ref 0–5)
Specific Gravity, Urine: 1.016 (ref 1.005–1.030)
Squamous Epithelial / HPF: NONE SEEN (ref 0–5)
WBC, UA: >50 WBC/hpf — ABNORMAL HIGH (ref 0–5)
pH: 5 (ref 5.0–8.0)

## 2019-11-02 LAB — TROPONIN I (HIGH SENSITIVITY)
Troponin I (High Sensitivity): 16 ng/L (ref <18)
Troponin I (High Sensitivity): 15 ng/L (ref <18)

## 2019-11-02 LAB — BLOOD GAS, VENOUS

## 2019-11-02 LAB — AMMONIA: Ammonia: 21 umol/L (ref 9–35)

## 2019-11-02 LAB — RESPIRATORY PANEL BY RT PCR (FLU A&B, COVID)
Influenza A by PCR: NEGATIVE
Influenza B by PCR: NEGATIVE
SARS Coronavirus 2 by RT PCR: NEGATIVE

## 2019-11-02 LAB — TSH: TSH: 2.613 u[IU]/mL (ref 0.350–4.500)

## 2019-11-02 MED ORDER — OXYCODONE HCL 5 MG PO TABS
10.0000 mg | ORAL_TABLET | Freq: Two times a day (BID) | ORAL | Status: DC | PRN
  Administered 2019-11-02: 10 mg via ORAL
  Filled 2019-11-02: qty 2

## 2019-11-02 MED ORDER — ACETAMINOPHEN 650 MG RE SUPP: 650.0000 mg | Freq: Four times a day (QID) | RECTAL | Status: DC | PRN

## 2019-11-02 MED ORDER — DULOXETINE HCL 60 MG PO CPEP
60.0000 mg | ORAL_CAPSULE | Freq: Every day | ORAL | Status: DC
  Administered 2019-11-02 – 2019-11-07 (×6): 60 mg via ORAL
  Filled 2019-11-02 (×7): qty 1

## 2019-11-02 MED ORDER — HYDRALAZINE HCL 20 MG/ML IJ SOLN
10.0000 mg | Freq: Four times a day (QID) | INTRAMUSCULAR | Status: DC | PRN
  Administered 2019-11-02 – 2019-11-03 (×2): 10 mg via INTRAVENOUS
  Filled 2019-11-02 (×3): qty 1

## 2019-11-02 MED ORDER — ENOXAPARIN SODIUM 40 MG/0.4ML ~~LOC~~ SOLN
40.0000 mg | SUBCUTANEOUS | Status: DC
  Administered 2019-11-02 – 2019-11-07 (×6): 40 mg via SUBCUTANEOUS
  Filled 2019-11-02 (×6): qty 0.4

## 2019-11-02 MED ORDER — ASPIRIN EC 81 MG PO TBEC
81.0000 mg | DELAYED_RELEASE_TABLET | Freq: Every day | ORAL | Status: DC
  Administered 2019-11-02 – 2019-11-07 (×6): 81 mg via ORAL
  Filled 2019-11-02 (×6): qty 1

## 2019-11-02 MED ORDER — HYDROCHLOROTHIAZIDE 25 MG PO TABS
12.5000 mg | ORAL_TABLET | Freq: Every day | ORAL | Status: DC
  Administered 2019-11-02 – 2019-11-06 (×5): 12.5 mg via ORAL
  Filled 2019-11-02 (×5): qty 1

## 2019-11-02 MED ORDER — TRAZODONE HCL 50 MG PO TABS: 25.0000 mg | ORAL_TABLET | Freq: Every evening | ORAL | Status: DC | PRN

## 2019-11-02 MED ORDER — LOSARTAN POTASSIUM 50 MG PO TABS
50.0000 mg | ORAL_TABLET | Freq: Every day | ORAL | Status: DC
  Administered 2019-11-02 – 2019-11-07 (×6): 50 mg via ORAL
  Filled 2019-11-02 (×6): qty 1

## 2019-11-02 MED ORDER — POTASSIUM CHLORIDE CRYS ER 20 MEQ PO TBCR: 20.0000 meq | EXTENDED_RELEASE_TABLET | Freq: Every day | ORAL | Status: DC | PRN

## 2019-11-02 MED ORDER — MAGNESIUM HYDROXIDE 400 MG/5ML PO SUSP: 30.0000 mL | Freq: Every day | ORAL | Status: DC | PRN

## 2019-11-02 MED ORDER — FUROSEMIDE 20 MG PO TABS
20.0000 mg | ORAL_TABLET | Freq: Every day | ORAL | Status: DC
  Administered 2019-11-02 – 2019-11-07 (×6): 20 mg via ORAL
  Filled 2019-11-02 (×6): qty 1

## 2019-11-02 MED ORDER — LORAZEPAM 2 MG/ML IJ SOLN: 0.5000 mg | Freq: Four times a day (QID) | INTRAMUSCULAR | Status: DC | PRN

## 2019-11-02 MED ORDER — SODIUM CHLORIDE 0.9 % IV SOLN: INTRAVENOUS | Status: DC

## 2019-11-02 MED ORDER — SODIUM CHLORIDE 0.9 % IV SOLN: INTRAVENOUS | Status: AC

## 2019-11-02 MED ORDER — POLYETHYLENE GLYCOL 3350 17 G PO PACK
34.0000 g | PACK | Freq: Two times a day (BID) | ORAL | Status: DC
  Administered 2019-11-02 – 2019-11-06 (×8): 34 g via ORAL
  Filled 2019-11-02 (×9): qty 2

## 2019-11-02 MED ORDER — ROSUVASTATIN CALCIUM 10 MG PO TABS
10.0000 mg | ORAL_TABLET | Freq: Every day | ORAL | Status: DC
  Administered 2019-11-02: 10 mg via ORAL
  Filled 2019-11-02 (×2): qty 1

## 2019-11-02 MED ORDER — ACETAMINOPHEN 325 MG PO TABS: 650.0000 mg | ORAL_TABLET | Freq: Four times a day (QID) | ORAL | Status: DC | PRN

## 2019-11-02 MED ORDER — NICOTINE 21 MG/24HR TD PT24
21.0000 mg | MEDICATED_PATCH | Freq: Every day | TRANSDERMAL | Status: DC
  Administered 2019-11-02 – 2019-11-07 (×6): 21 mg via TRANSDERMAL
  Filled 2019-11-02 (×6): qty 1

## 2019-11-02 NOTE — ED Notes (Signed)
Advised MD. Beather Arbour , of attempt to in and out cath. Unsuccesful. MD advised to let pt try to urinate in a urinal .  Bladder scan . [place]

## 2019-11-02 NOTE — ED Notes (Signed)
Pt given meal tray and ate 100% 

## 2019-11-02 NOTE — Progress Notes (Signed)
PROGRESS NOTE    Patient: [name] [name]                            PCP: [name], [name], MD                    DOB: [birth year]            DOA: 11/01/2019 [id number]:[id number]             DOS: 11/02/2019, 8:01 AM   LOS: 0 days   Date of Service: The patient was seen and examined on 11/02/2019  Subjective:   The patient was seen and examined this Am. Mental status seem to be improving, awake alert, but slow in responding. No complaint at this time  Brief Narrative:   [name] [name]  is a 79 y.o. African-American male with a known history of lumbar spine stenosis with chronic back pain and urolithiasis, who presented to the emergency room with acute onset of altered mental status with confusion with suspected accidental overdose with opiates.    Per EMS they were called out by his daughter who was worried about the possibility of overdose.He was found    slumped over wheelchair drooling and not speaking.  He had coarse breath sounds and they give him IV Solu-Medrol and nebulizer treatment that did not change his mental status.  Blood glucose was within normal.    ED BP 159/91 with otherwise normal vital signs.  Labs revealed ABG with pH 7.28 with PCO2 of 71 and HCO3 33.4.  BUN was 16 and creatinine 1.29 magnesium was 1.9.  High-sensitivity troponin was 16 and later 15.  CBC was unremarkable Tylenol levels less than 10 and salicylate less than 7 with TSH 2.6.  Respiratory panel came back negative including COVID-19 PCR.   UA showed more than 50 RBCs and more than 50 WBCs.  Urine drug screen came back negative.   Noncontrasted head CT scan revealed stable atrophy and chronic small vessel ischemic disease with remote lacunar infarcts in the basal ganglia with no acute intracranial normalities.  EKG showed sinus rhythm with rate of 64 with probable left atrial enlargement and right bundle branch block as well as ureter Q waves.   Assessment & Plan:   Active Problems:   AMS (altered  mental status)    1.  Acute metabolic encephalopathy likely multifactorial secondary to UTI and possible accident opiate overdose. -Monitoring very closely -Continue with neurochecks -PT OT evaluation -Mental status likely complicated by UTI -We will follow with cultures and sensitivity -We will continue IV Rocephin   2.  Hypertension. -Stable we will continue to monitor -As needed hydralazine   3.  Dyslipidemia. -Continue statins  4.  Chronic back pain. -Holding off on narcotics at this time   DVT prophylaxis. -Subtenons Lovenox  Family discussion -none present at bedside  Time for me to discuss with daughter - The plan of care was discussed in details with the patient (and family). .   CODE STATUS: Full code  Status is: Observation  The patient remains OBS appropriate and will d/c before 2 midnights.  Dispo: The patient is from: Home  Anticipated d/c is to: Home  Anticipated d/c date is: 1 day  Patient currently is not medically stable to d/c.      Nutritional status:         Cultures;  Urine Culture  >>>   Antimicrobials: IV Rocephin  Procedures:   No admission procedures for hospital encounter.    Antimicrobials:  Anti-infectives (From admission, onward)   None       Medication:  . aspirin EC  81 mg Oral Daily  . DULoxetine  60 mg Oral Daily  . enoxaparin (LOVENOX) injection  40 mg Subcutaneous Q24H  . furosemide  20 mg Oral Daily  . hydrochlorothiazide  12.5 mg Oral Daily  . losartan  50 mg Oral Daily  . nicotine  21 mg Transdermal Daily  . polyethylene glycol  34 g Oral BID  . rosuvastatin  10 mg Oral q1800    acetaminophen **OR** acetaminophen, hydrALAZINE, magnesium hydroxide, oxyCODONE, potassium chloride SA, traZODone   Objective:   Vitals:   11/02/19 0400 11/02/19 0430 11/02/19 0530 11/02/19 0630  BP: (!) 137/98 (!) 152/80 (!) 154/72 (!) 160/74  Pulse: (!)  54 (!) 59 (!) 54 (!) 58  Resp:      Temp:      TempSrc:      SpO2: 97% 94% 99% 91%  Weight:      Height:       No intake or output data in the 24 hours ending 11/02/19 0801 Filed Weights   11/01/19 2227  Weight: 59 kg     Examination:   Physical Exam  Constitution:  Alert, cooperative, no distress,  Appears calm and comfortable     Psychiatric:  HEENT: Normocephalic, PERRL, otherwise with in Normal limits  Chest:Chest symmetric Cardio vascular:  S1/S2, RRR, No murmure, No Rubs or Gallops  pulmonary: Clear to auscultation bilaterally, respirations unlabored, negative wheezes / crackles Abdomen: Soft, non-tender, non-distended, bowel sounds,no masses, no organomegaly Muscular skeletal: Limited exam - in bed, able to move all 4 extremities, Normal strength,  Neuro: CNII-XII intact. , normal motor and sensation, reflexes intact  Extremities: No pitting edema lower extremities, +2 pulses  Skin: Dry, warm to touch, negative for any Rashes, No open wounds Wounds: per nursing documentation    ------------------------------------------------------------------------------------------------------------------------------------------    LABs:  CBC Latest Ref Rng & Units 11/01/2019 09/22/2019 09/21/2019  WBC 4.0 - 10.5 K/uL 9.2 9.5 9.5  Hemoglobin 13.0 - 17.0 g/dL 13.6 12.6(L) 13.2  Hematocrit 39 - 52 % 44.2 38.4(L) 40.1  Platelets 150 - 400 K/uL 268 236 220   CMP Latest Ref Rng & Units 11/01/2019 09/22/2019 09/21/2019  Glucose 70 - 99 mg/dL 89 88 90  BUN 8 - 23 mg/dL 16 25(H) 21  Creatinine 0.61 - 1.24 mg/dL 1.29(H) 1.07 1.07  Sodium 135 - 145 mmol/L 138 137 139  Potassium 3.5 - 5.1 mmol/L 4.7 3.9 3.9  Chloride 98 - 111 mmol/L 102 105 108  CO2 22 - 32 mmol/L 28 23 20(L)  Calcium 8.9 - 10.3 mg/dL 9.2 8.5(L) 8.2(L)  Total Protein 6.5 - 8.1 g/dL 7.9 - -  Total Bilirubin 0.3 - 1.2 mg/dL 1.0 - -  Alkaline Phos 38 - 126 U/L 107 - -  AST 15 - 41 U/L 26 - -  ALT 0 - 44 U/L 19 - -        Micro Results Recent Results (from the past 240 hour(s))  Respiratory Panel by RT PCR (Flu A&B, Covid) - Nasopharyngeal Swab     Status: None   Collection Time: 11/01/19 10:57 PM   Specimen: Nasopharyngeal Swab  Result Value Ref Range Status   SARS Coronavirus 2 by RT PCR NEGATIVE NEGATIVE Final    Comment: (NOTE) SARS-CoV-2 target nucleic acids are NOT DETECTED.  The SA  Influenza A by PCR NEGATIVE NEGATIVE Final   Influenza B by PCR NEGATIVE NEGATIVE Final    Comment: (NOTE) The Xpert Xpress SARS-CoV-2/FLU/RSV assay is intended as an aid in  the diagnosis of influenza from Nasopharyngeal swab specimens and  should not be used as a sole basis for treatment. Nasal washings and  aspirates are unacceptable for Xpert Xpress SARS Performed at [facility], [address] [place], [place] [address]     Radiology Reports DG Chest 1 View  Result Date: 11/01/2019 CLINICAL DATA:  Increased weakness and altered mental status. EXAM: CHEST  1 VIEW COMPARISON:  May 03, 2019 FINDINGS: The heart size and mediastinal contours are within normal limits. Both lungs are clear. Multilevel degenerative changes seen within the mid and lower thoracic spine. IMPRESSION: No acute cardiopulmonary disease. Electronically Signed   By: Virgina Norfolk M.D.   On: 11/01/2019 22:41   CT Head Wo Contrast  Result Date: 11/01/2019 CLINICAL DATA:  Mental status change, unknown callus. EXAM: CT HEAD WITHOUT CONTRAST TECHNIQUE: Contiguous axial images were obtained from the base of the skull through the vertex without intravenous contrast. COMPARISON:  Head CT 09/19/2019 FINDINGS: Brain: Stable degree of atrophy and chronic small vessel ischemia. Remote lacunar infarct in the right greater than left basal ganglia. No evidence of acute infarction, hemorrhage, hydrocephalus, extra-axial collection or mass lesion/mass effect. Vascular: Atherosclerosis of skullbase vasculature without hyperdense vessel  or abnormal calcification. Skull: No fracture or focal lesion. Sinuses/Orbits: Remote medial left orbital fracture. Bilateral cataract resection. Mastoid air cells are clear. No acute findings. Other: None. IMPRESSION: 1. No acute intracranial abnormality. 2. Stable atrophy and chronic small vessel ischemia. Remote lacunar infarcts in the basal ganglia. Electronically Signed   By: Keith Rake M.D.   On: 11/01/2019 23:29    SIGNED: Deatra James, MD, FACP, FHM. [facility],  Pager (please use [email or web address] to page/text)  If 7PM-7AM, please contact night-coverage [email or web address], Password [facility] 11/02/2019, 8:01 AM

## 2019-11-02 NOTE — ED Provider Notes (Signed)
-----------------------------------------   1:53 AM on 11/02/2019 -----------------------------------------  Patient had very soaked diaper.  Only 260 mill on bladder scan.  No urine obtained on in and out cath.  Patient currently has urinal to collect urine.  Remains somnolent but arousable to voice.  Have discussed case with hospitalist services for admission.      Paulette Blanch, MD 11/02/19 [phone]

## 2019-11-02 NOTE — ED Notes (Signed)
Pt able to pass swallow screen but does have some minor drooling from the left side of his mouth. Will continue on aspiration precautions and pills in applesauce until SLP able to eval.

## 2019-11-02 NOTE — H&P (Addendum)
[place]   PATIENT NAME: [name] [name]    MR#:  [id number]  DATE OF BIRTH:  [birth year]  DATE OF ADMISSION:  11/01/2019  PRIMARY CARE PHYSICIAN: [name], [name], MD   REQUESTING/REFERRING PHYSICIAN: Cora Collum, MD  CHIEF COMPLAINT:   Chief Complaint  Patient presents with  . Drug Overdose    possible medication overdose per EMS      HISTORY OF PRESENT ILLNESS:  [name] [name]  is a 79 y.o. African-American male with a known history of lumbar spine stenosis with chronic back pain and urolithiasis, who presented to the emergency room with acute onset of altered mental status with confusion with suspected accidental overdose with opiates.  Per EMS they were called out by his daughter who was worried about the possibility of overdose.  The patient has worsening vision that has been deteriorating over the last several weeks.  Upon EMS arrival he was unable to answer the door and they were able to open and found patient slumped over wheelchair drooling and not speaking.  He had coarse breath sounds and they give him IV Solu-Medrol and nebulizer treatment that did not change his mental status.  Blood glucose was within normal.  He had stable vital signs on route to the hospital.  The patient was slow to answer questions.  He denied any fever or chills, nausea or vomiting or abdominal pain.  No chest pain or dyspnea or palpitations or cough during my interview.  No fever or chills.  Upon presentation to the ER, blood pressure was 159/91 with otherwise normal vital signs.  Labs revealed ABG with pH 7.28 with PCO2 of 71 and HCO3 33.4.  BUN was 16 and creatinine 1.29 magnesium was 1.9.  High-sensitivity troponin was 16 and later 15.  CBC was unremarkable Tylenol levels less than 10 and salicylate less than 7 with TSH 2.6.  Respiratory panel came back negative including COVID-19 PCR.  UA showed more than 50 RBCs and more than 50 WBCs.  Urine drug screen came back negative.  Noncontrasted  head CT scan revealed stable atrophy and chronic small vessel ischemic disease with remote lacunar infarcts in the basal ganglia with no acute intracranial normalities. EKG showed sinus rhythm with rate of 64 with probable left atrial enlargement and right bundle branch block as well as ureter Q waves.  The patient will be admitted to a medical monitored observation bed for further management. PAST MEDICAL HISTORY:   Past Medical History:  Diagnosis Date  . Back pain   . Renal disorder    kidney stones  . Spinal stenosis, lumbar region, with neurogenic claudication 07/07/2014    PAST SURGICAL HISTORY:   Past Surgical History:  Procedure Laterality Date  . CATARACT EXTRACTION W/PHACO Left 08/22/2016   Procedure: CATARACT EXTRACTION PHACO AND INTRAOCULAR LENS PLACEMENT ([place]) Left;  Surgeon: [name], MD;  Location: [facility];  Service: Ophthalmology;  Laterality: Left;  . CATARACT EXTRACTION W/PHACO Right 10/25/2016   Procedure: CATARACT EXTRACTION PHACO AND INTRAOCULAR LENS PLACEMENT ([place]) WITH ISTENT RIGHT  ;  Surgeon: [name], MD;  Location: [place] [place];  Service: Ophthalmology;  Laterality: Right;  TOPICAL RIGHT  ISTENT   trabecular bypass stent was implanted/explanted.  . INGUINAL HERNIA REPAIR    . KNEE SURGERY Right   . NECK SURGERY      SOCIAL HISTORY:   Social History   Tobacco Use  . Smoking status: Current Every Day Smoker  Packs/day: 0.75    Types: Cigarettes  . Smokeless tobacco: Never Used  Substance Use Topics  . Alcohol use: No    FAMILY HISTORY:   Family History  Problem Relation Age of Onset  . Diabetes Mother   . Diabetes Brother   . Diabetes Sister     DRUG ALLERGIES:  No Known Allergies  REVIEW OF SYSTEMS:   ROS As per history of present illness. All pertinent systems were reviewed above. Constitutional, HEENT, cardiovascular, respiratory, GI, GU, musculoskeletal, neuro, psychiatric, endocrine,  integumentary and hematologic systems were reviewed and are otherwise negative/unremarkable except for positive findings mentioned above in the HPI.   MEDICATIONS AT HOME:   Prior to Admission medications   Medication Sig Start Date End Date Taking? Authorizing Provider  DULoxetine (CYMBALTA) 60 MG capsule Take 1 capsule (60 mg total) by mouth daily. 10/15/19  Yes [name], [name], MD  furosemide (LASIX) 20 MG tablet 2 tabs daily for 5 days then 1-2 tabs daily as needed for leg swelling. 10/16/19  Yes [name], [name], MD  hydrochlorothiazide (HYDRODIURIL) 12.5 MG tablet Take 12.5 mg by mouth daily. 10/25/19  Yes [provider]  losartan (COZAAR) 50 MG tablet Take 1 tablet (50 mg total) by mouth daily. 10/15/19  Yes [name], [name], MD  nicotine (NICODERM CQ - DOSED IN MG/24 HOURS) 21 mg/24hr patch Place 1 patch (21 mg total) onto the skin daily. 09/23/19  Yes [name], MD  Oxycodone HCl 10 MG TABS Take 1 tablet (10 mg total) by mouth 2 (two) times daily as needed (for moderate - severe pain). 10/16/19  Yes [name], [name], MD  polyethylene glycol (MIRALAX / GLYCOLAX) 17 g packet Take 34 g by mouth 2 (two) times daily. 09/22/19  Yes [name], MD  potassium chloride SA (KLOR-CON) 20 MEQ tablet Take 1 tablet (20 mEq total) by mouth daily as needed (When taking fluid pill.). 10/16/19  Yes [name], [name], MD  rosuvastatin (CRESTOR) 10 MG tablet Take 1 tablet (10 mg total) by mouth daily. 10/16/19  Yes [name], [name], MD      VITAL SIGNS:  Blood pressure (!) 154/72, pulse (!) 54, temperature 97.8 F (36.6 C), temperature source Oral, resp. rate 11, height 5\' 10"  (1.778 m), weight 59 kg, SpO2 99 %.  PHYSICAL EXAMINATION:  Physical Exam  GENERAL:  79 y.o.-year-old African-American male patient lying in the bed with no acute distress.  EYES: Pupils equal, round, reactive to light and accommodation. No scleral icterus. Extraocular muscles intact.  HEENT: Head atraumatic, normocephalic.  Oropharynx and nasopharynx clear.  NECK:  Supple, no jugular venous distention. No thyroid enlargement, no tenderness.  LUNGS: Normal breath sounds bilaterally, no wheezing, rales,rhonchi or crepitation. No use of accessory muscles of respiration.  CARDIOVASCULAR: Regular rate and rhythm, S1, S2 normal. No murmurs, rubs, or gallops.  ABDOMEN: Soft, nondistended, nontender. Bowel sounds present. No organomegaly or mass.  EXTREMITIES: No pedal edema, cyanosis, or clubbing.  NEUROLOGIC: Cranial nerves II through XII are intact. Muscle strength 5/5 in all extremities. Sensation intact. Gait not checked.  He was slow to answer questions. PSYCHIATRIC: The patient is somnolent but arousable and oriented x 3.  Normal affect and good eye contact. SKIN: No obvious rash, lesion, or ulcer.   LABORATORY PANEL:   CBC Recent Labs  Lab 11/01/19 2257  WBC 9.2  HGB 13.6  HCT 44.2  PLT 268   ------------------------------------------------------------------------------------------------------------------  Chemistries  Recent Labs  Lab 11/01/19 2257  NA 138  K 4.7  CL 102  CO2 28  GLUCOSE 89  BUN 16  CREATININE 1.29*  CALCIUM 9.2  MG 1.9  AST 26  ALT 19  ALKPHOS 107  BILITOT 1.0   ------------------------------------------------------------------------------------------------------------------  Cardiac Enzymes No results for input(s): TROPONINI in the last 168 hours. ------------------------------------------------------------------------------------------------------------------  RADIOLOGY:  DG Chest 1 View  Result Date: 11/01/2019 CLINICAL DATA:  Increased weakness and altered mental status. EXAM: CHEST  1 VIEW COMPARISON:  May 03, 2019 FINDINGS: The heart size and mediastinal contours are within normal limits. Both lungs are clear. Multilevel degenerative changes seen within the mid and lower thoracic spine. IMPRESSION: No acute cardiopulmonary disease. Electronically Signed   By:  Virgina Norfolk M.D.   On: 11/01/2019 22:41   CT Head Wo Contrast  Result Date: 11/01/2019 CLINICAL DATA:  Mental status change, unknown callus. EXAM: CT HEAD WITHOUT CONTRAST TECHNIQUE: Contiguous axial images were obtained from the base of the skull through the vertex without intravenous contrast. COMPARISON:  Head CT 09/19/2019 FINDINGS: Brain: Stable degree of atrophy and chronic small vessel ischemia. Remote lacunar infarct in the right greater than left basal ganglia. No evidence of acute infarction, hemorrhage, hydrocephalus, extra-axial collection or mass lesion/mass effect. Vascular: Atherosclerosis of skullbase vasculature without hyperdense vessel or abnormal calcification. Skull: No fracture or focal lesion. Sinuses/Orbits: Remote medial left orbital fracture. Bilateral cataract resection. Mastoid air cells are clear. No acute findings. Other: None. IMPRESSION: 1. No acute intracranial abnormality. 2. Stable atrophy and chronic small vessel ischemia. Remote lacunar infarcts in the basal ganglia. Electronically Signed   By: Keith Rake M.D.   On: 11/01/2019 23:29      IMPRESSION AND PLAN:   1.  Acute metabolic encephalopathy likely multifactorial secondary to UTI and possible accident opiate overdose. -The patient will be admitted to a medical monitored observation bed. -We will follow neuro checks every 4 hours for 24 hours. -We will place on antibiotic therapy with IV Rocephin. -We will follow urine culture and sensitivity.  2.  Hypertension. -We will continue his antihypertensives and place him on as needed IV hydralazine..  3.  Dyslipidemia. -We will continue statin therapy.  4.  Chronic back pain. -We will hold off on narcotics.  5.  DVT prophylaxis. -Subtenons Lovenox  All the records are reviewed and case discussed with ED provider. The plan of care was discussed in details with the patient (and family). I answered all questions. The patient agreed to proceed  with the above mentioned plan. Further management will depend upon hospital course.   CODE STATUS: Full code  Status is: Observation  The patient remains OBS appropriate and will d/c before 2 midnights.  Dispo: The patient is from: Home              Anticipated d/c is to: Home              Anticipated d/c date is: 1 day              Patient currently is not medically stable to d/c.   TOTAL TIME TAKING CARE OF THIS PATIENT: 55 minutes.    Christel Mormon M.D on 11/02/2019 at 6:31 AM  [facility]   From 7 PM-7 AM, contact night-coverage [email or web address]  CC: Primary care physician; [name], [name], MD

## 2019-11-02 NOTE — ED Notes (Signed)
Report to sarah, rn.  

## 2019-11-02 NOTE — ED Notes (Signed)
Secure chat message sent to brenda morrison, np for her to review blood gas

## 2019-11-02 NOTE — ED Notes (Signed)
Pt begins coughing up food while eating dinner. Pt sat up in bed straight but continued to cough. MD messaged about possible swallow eval. Pt dinner taken away at this time.

## 2019-11-02 NOTE — ED Notes (Signed)
Pt resting, awakes to verbal stimuli. Call bell at left side.

## 2019-11-02 NOTE — ED Notes (Signed)
MRI talking to pt on phone for screening

## 2019-11-03 ENCOUNTER — Inpatient Hospital Stay: Payer: Medicare HMO

## 2019-11-03 ENCOUNTER — Inpatient Hospital Stay (HOSPITAL_COMMUNITY)
Admit: 2019-11-03 | Discharge: 2019-11-03 | Disposition: A | Discharge status: Home / Self Care | Payer: Medicare HMO | Attending: Family Medicine | Admitting: Family Medicine

## 2019-11-03 DIAGNOSIS — T50901A Poisoning by unspecified drugs, medicaments and biological substances, accidental (unintentional), initial encounter: Secondary | ICD-10-CM | POA: Diagnosis not present

## 2019-11-03 DIAGNOSIS — Z79891 Long term (current) use of opiate analgesic: Secondary | ICD-10-CM | POA: Diagnosis not present

## 2019-11-03 DIAGNOSIS — R262 Difficulty in walking, not elsewhere classified: Secondary | ICD-10-CM

## 2019-11-03 DIAGNOSIS — Z681 Body mass index (BMI) 19 or less, adult: Secondary | ICD-10-CM | POA: Diagnosis not present

## 2019-11-03 DIAGNOSIS — E43 Unspecified severe protein-calorie malnutrition: Secondary | ICD-10-CM | POA: Diagnosis present

## 2019-11-03 DIAGNOSIS — Z87442 Personal history of urinary calculi: Secondary | ICD-10-CM | POA: Diagnosis not present

## 2019-11-03 DIAGNOSIS — Z833 Family history of diabetes mellitus: Secondary | ICD-10-CM | POA: Diagnosis not present

## 2019-11-03 DIAGNOSIS — M48062 Spinal stenosis, lumbar region with neurogenic claudication: Secondary | ICD-10-CM | POA: Diagnosis present

## 2019-11-03 DIAGNOSIS — I639 Cerebral infarction, unspecified: Secondary | ICD-10-CM | POA: Diagnosis present

## 2019-11-03 DIAGNOSIS — R29703 NIHSS score 3: Secondary | ICD-10-CM | POA: Diagnosis not present

## 2019-11-03 DIAGNOSIS — I700 Atherosclerosis of aorta: Secondary | ICD-10-CM | POA: Diagnosis present

## 2019-11-03 DIAGNOSIS — R1310 Dysphagia, unspecified: Secondary | ICD-10-CM | POA: Diagnosis present

## 2019-11-03 DIAGNOSIS — I63233 Cerebral infarction due to unspecified occlusion or stenosis of bilateral carotid arteries: Secondary | ICD-10-CM | POA: Diagnosis not present

## 2019-11-03 DIAGNOSIS — I6381 Other cerebral infarction due to occlusion or stenosis of small artery: Secondary | ICD-10-CM | POA: Diagnosis not present

## 2019-11-03 DIAGNOSIS — N400 Enlarged prostate without lower urinary tract symptoms: Secondary | ICD-10-CM | POA: Diagnosis present

## 2019-11-03 DIAGNOSIS — E7800 Pure hypercholesterolemia, unspecified: Secondary | ICD-10-CM | POA: Diagnosis present

## 2019-11-03 DIAGNOSIS — I6389 Other cerebral infarction: Secondary | ICD-10-CM

## 2019-11-03 DIAGNOSIS — I4510 Unspecified right bundle-branch block: Secondary | ICD-10-CM | POA: Diagnosis present

## 2019-11-03 DIAGNOSIS — I10 Essential (primary) hypertension: Secondary | ICD-10-CM | POA: Diagnosis present

## 2019-11-03 DIAGNOSIS — Z20822 Contact with and (suspected) exposure to covid-19: Secondary | ICD-10-CM | POA: Diagnosis present

## 2019-11-03 DIAGNOSIS — T402X1A Poisoning by other opioids, accidental (unintentional), initial encounter: Secondary | ICD-10-CM | POA: Diagnosis present

## 2019-11-03 DIAGNOSIS — N390 Urinary tract infection, site not specified: Secondary | ICD-10-CM | POA: Diagnosis present

## 2019-11-03 DIAGNOSIS — G8929 Other chronic pain: Secondary | ICD-10-CM | POA: Diagnosis present

## 2019-11-03 DIAGNOSIS — G9341 Metabolic encephalopathy: Secondary | ICD-10-CM | POA: Diagnosis present

## 2019-11-03 DIAGNOSIS — F17210 Nicotine dependence, cigarettes, uncomplicated: Secondary | ICD-10-CM | POA: Diagnosis present

## 2019-11-03 DIAGNOSIS — R4182 Altered mental status, unspecified: Secondary | ICD-10-CM | POA: Diagnosis not present

## 2019-11-03 DIAGNOSIS — M159 Polyosteoarthritis, unspecified: Secondary | ICD-10-CM | POA: Diagnosis present

## 2019-11-03 DIAGNOSIS — E785 Hyperlipidemia, unspecified: Secondary | ICD-10-CM | POA: Diagnosis present

## 2019-11-03 LAB — URINE CULTURE: Culture: 60000 — AB

## 2019-11-03 LAB — LIPID PANEL
Cholesterol: 142 mg/dL (ref 0–200)
HDL: 76 mg/dL (ref >40)
LDL Cholesterol: 57 mg/dL (ref 0–99)
Total CHOL/HDL Ratio: 1.9 RATIO
Triglycerides: 47 mg/dL (ref <150)
VLDL: 9 mg/dL (ref 0–40)

## 2019-11-03 MED ORDER — CLOPIDOGREL BISULFATE 75 MG PO TABS
75.0000 mg | ORAL_TABLET | Freq: Every day | ORAL | Status: DC
  Administered 2019-11-03 – 2019-11-07 (×5): 75 mg via ORAL
  Filled 2019-11-03 (×5): qty 1

## 2019-11-03 MED ORDER — ROSUVASTATIN CALCIUM 10 MG PO TABS
40.0000 mg | ORAL_TABLET | Freq: Every day | ORAL | Status: DC
  Administered 2019-11-03 – 2019-11-06 (×4): 40 mg via ORAL
  Filled 2019-11-03: qty 4
  Filled 2019-11-03 (×2): qty 2
  Filled 2019-11-03 (×2): qty 4

## 2019-11-03 MED ORDER — STROKE: EARLY STAGES OF RECOVERY BOOK: Freq: Once | Status: DC

## 2019-11-03 NOTE — Consult Note (Signed)
Neurology Consultation Reason for Consult: Acute ischemic stroke Referring Physician: Roger Shelter, S  CC: Left-sided weakness  History is obtained from: Patient, sister  HPI: [name] [name] is a 79 y.o. male with a history of spinal stenosis, hypertension, hypercholesterolemia who presents with increased falls.  Yesterday, he was having difficulty with getting up and therefore asked his sister to call 911.  He was brought into the emergency department where he was evaluated with MRI which reveals an acute ischemic infarct in the subcortical white matter on the right.  He does not notice any specific problems with his left side, but he thinks that he has been having increased falls over the past month, making time of onset slightly unclear.  LKW: Unclear, but definitely worsened yesterday tpa given?: no, unclear time of onset    ROS: A 14 point ROS was performed and is negative except as noted in the HPI.   Past Medical History:  Diagnosis Date  . Back pain   . Renal disorder    kidney stones  . Spinal stenosis, lumbar region, with neurogenic claudication 07/07/2014     Family History  Problem Relation Age of Onset  . Diabetes Mother   . Diabetes Brother   . Diabetes Sister      Social History:  reports that he has been smoking cigarettes. He has been smoking about 0.75 packs per day. He has never used smokeless tobacco. He reports that he does not drink alcohol and does not use drugs.   Exam: Current vital signs: BP 133/88   Pulse 80   Temp 97.8 F (36.6 C) (Oral)   Resp 10   Ht 5\' 10"  (1.778 m)   Wt 59 kg   SpO2 98%   BMI 18.66 kg/m  Vital signs in last 24 hours: Pulse Rate:  [57-95] 80 (10/03 0515) Resp:  [10-21] 10 (10/03 0515) BP: (126-218)/(79-137) 133/88 (10/03 0515) SpO2:  [97 %-100 %] 98 % (10/03 0515)   Physical Exam  Constitutional: Appears well-developed and well-nourished.  Psych: Affect appropriate to situation Eyes: No scleral  injection HENT: No OP obstrucion MSK: no joint deformities.  Cardiovascular: Normal rate and regular rhythm.  Respiratory: Effort normal, non-labored breathing GI: Soft.  No distension. There is no tenderness.  Skin: WDI  Neuro: Mental Status: Patient is awake, alert, he gives the year as 2001, month is September. Patient is able to give a clear and coherent history. No signs of neglect, he has a prolonged latency of response, but no difficulty with naming or repetition. Cranial Nerves: II: Visual Fields are full. Pupils are equal, round, and reactive to light.   III,IV, VI: EOMI without ptosis or diploplia.  V: Facial sensation is symmetric to temperature VII: He has significant weakness of the left face including the forehead VIII: hearing is intact to voice X: Uvula elevates symmetrically XI: Shoulder shrug is symmetric. XII: tongue is midline without atrophy or fasciculations.  Motor: Tone is normal. Bulk is normal. 5/5 strength was present in all four extremities.  Sensory: Sensation is symmetric to light touch and temperature in the arms and legs. Deep Tendon Reflexes: 2+ and symmetric in the biceps  Cerebellar: He has slower finger-nose-finger on the left than the right   I have reviewed labs in epic and the results pertinent to this consultation are: LDL of 57   I have reviewed the images obtained: Ultrasound carotids-no hemodynamically significant stenosis    Impression: 79 year old male with acute lacunar infarct in the right corona  radiata.  There is an additional punctate infarct in the right parietal lobe, these have the appearance of synchronous small vessel disease as opposed to embolic disease.  He has being admitted for physical therapy and modification of his secondary risk factors.  Recommendations: 1) echocardiogram 2) MRA head 3) A1c 4) dual antiplatelet therapy with aspirin and Plavix for 3 weeks followed by monotherapy with Plavix 75 mg daily 5)  PT, OT, ST   Roland Rack, MD [facility] [phone]  If 7pm- 7am, please page neurology on call as listed in [place].

## 2019-11-03 NOTE — ED Notes (Addendum)
Patient given a small cup of lukewarm coffee and began choking. RN called SLP to see if this had been done spoke with Stormy Fabian (speech therapist) and she stated to try patient on puree with honey nectar, if not able to tolerate place patient on NPO

## 2019-11-03 NOTE — Progress Notes (Signed)
*  PRELIMINARY RESULTS* Echocardiogram 2D Echocardiogram has been performed.  [name] [name] 11/03/2019, 12:18 PM

## 2019-11-03 NOTE — Progress Notes (Signed)
PROGRESS NOTE    Patient: [name] [name]                            PCP: [name], [name], MD                    DOB: [birth year]            DOA: 11/01/2019 QQP:[id number]             DOS: 11/03/2019, 10:38 AM   LOS: 0 days   Date of Service: The patient was seen and examined on 11/03/2019  Subjective:   The patient was seen and examined this morning, alert oriented x3 much more awake alert. Much improved mental status today Complaining of no asymmetric weakness or speech difficulties this morning Remaining in bed Patient was informed of new finding on MRI consistent with stroke State he does not take aspirin and Plavix at home -reports of no history of stroke   Brief Narrative:   [name] [name]  is a 79 y.o. African-American male with a known history of lumbar spine stenosis with chronic back pain and urolithiasis, who presented to the emergency room with acute onset of altered mental status with confusion with suspected accidental overdose with opiates.    Per EMS they were called out by his daughter who was worried about the possibility of overdose.He was found    slumped over wheelchair drooling and not speaking.  He had coarse breath sounds and they give him IV Solu-Medrol and nebulizer treatment that did not change his mental status.  Blood glucose was within normal.    ED BP 159/91 with otherwise normal vital signs.  Labs revealed ABG with pH 7.28 with PCO2 of 71 and HCO3 33.4.  BUN was 16 and creatinine 1.29 magnesium was 1.9.  High-sensitivity troponin was 16 and later 15.  CBC was unremarkable Tylenol levels less than 10 and salicylate less than 7 with TSH 2.6.  Respiratory panel came back negative including COVID-19 PCR.   UA showed more than 50 RBCs and more than 50 WBCs.  Urine drug screen came back negative.   Noncontrasted head CT scan revealed stable atrophy and chronic small vessel ischemic disease with remote lacunar infarcts in the basal ganglia with no  acute intracranial normalities.  EKG showed sinus rhythm with rate of 64 with probable left atrial enlargement and right bundle branch block as well as ureter Q waves.   Assessment & Plan:   Principal Problem:   CVA (cerebral vascular accident) ([place]) Active Problems:   Spinal stenosis, lumbar region, with neurogenic claudication   Generalized osteoarthritis of multiple sites   Hypertension, essential, benign   Ambulatory dysfunction   Weakness   AMS (altered mental status)   Acute lower UTI   Acute CVA -According to the history symptoms started more than a week ago -Which may have contributed to encephalopathy -MRI of the brain was reviewed:  IMPRESSION: 1. Acute infarct in the right corona radiata. Additional punctate infarct in the right parietal lobe white matter. No substantial mass effect. 2. Remote lacunar infarcts involving the bilateral thalami, bifrontal white matter, and pons. 3. Extensive chronic microvascular ischemic disease.  Neurology consulted -Patient is outside the window for TPA... Not a candidate -Starting aspirin Plavix high-dose statins -We will continue to monitor closely -Stroke work-up PT/OT/speech -Carotid ultrasound, 2D echocardiogram -CT and MRI of the brain is reviewed  Acute metabolic encephalopathy -Multifactorial secondary to  UTI, opioid overdose, CVA acute -Mental status improved, likely back to baseline now -We will continue to monitor, continue IV antibiotics IV fluids    Hypertension. -Stable we will continue to monitor -Lipid panel reviewed LDL 57    Dyslipidemia. --Increasing statin to high-dose -We will recheck fasting lipid panel  Chronic back pain. -Holding off on narcotics at this time   DVT prophylaxis. -Subtenons Lovenox  Family discussion -none present at bedside  Time for me to discuss with daughter - The plan of care was discussed in details with the patient (and family). .   CODE STATUS: Full  code  Status is: Observation  The patient remains OBS appropriate and will d/c before 2 midnights.  Dispo: The patient is from: Home  Anticipated d/c is to: Home w [facility]   Anticipated d/c date is: 1 day  Patient currently is not medically stable to d/c.      Nutritional status:         Cultures; Urine Culture  >>> 60 K colonies of multiple species Repeat urine culture  Antimicrobials: IV Rocephin        Procedures:   No admission procedures for hospital encounter.    Antimicrobials:  Anti-infectives (From admission, onward)   None       Medication:  . aspirin EC  81 mg Oral Daily  . clopidogrel  75 mg Oral Daily  . DULoxetine  60 mg Oral Daily  . enoxaparin (LOVENOX) injection  40 mg Subcutaneous Q24H  . furosemide  20 mg Oral Daily  . hydrochlorothiazide  12.5 mg Oral Daily  . losartan  50 mg Oral Daily  . nicotine  21 mg Transdermal Daily  . polyethylene glycol  34 g Oral BID  . rosuvastatin  40 mg Oral q1800    acetaminophen **OR** acetaminophen, hydrALAZINE, LORazepam, magnesium hydroxide, oxyCODONE, potassium chloride SA, traZODone   Objective:   Vitals:   11/03/19 0400 11/03/19 0430 11/03/19 0500 11/03/19 0515  BP: (!) 158/100 (!) 162/110 (!) 163/96 133/88  Pulse: 91 94 88 80  Resp:    10  Temp:      TempSrc:      SpO2: 99% 98% 99% 98%  Weight:      Height:        Intake/Output Summary (Last 24 hours) at 11/03/2019 1038 Last data filed at 11/03/2019 0800 Gross per 24 hour  Intake 1000 ml  Output 4776 ml  Net -3776 ml   Filed Weights   11/01/19 2227  Weight: 59 kg     Examination:     Physical Exam:   General:   Much more awake alert oriented today  HEENT:  Normocephalic, PERRL, otherwise with in Normal limits   Neuro:   No new focal neurological findings-CNII-XII intact. , normal motor and sensation, reflexes intact   Lungs:   Clear to auscultation BL, Respirations unlabored, no  wheezes / crackles  Cardio:    S1/S2, RRR, No murmure, No Rubs or Gallops   Abdomen:   Soft, non-tender, bowel sounds active all four quadrants,  no guarding or peritoneal signs.  Muscular skeletal:   Generalized weaknesses-Limited exam - in bed, able to move all 4 extremities, Normal strength,  2+ pulses,  symmetric, No pitting edema  Skin:  Dry, warm to touch, negative for any Rashes, No open wounds  Wounds: Please see nursing documentation        ------------------------------------------------------------------------------------------------------------------------------------------    LABs:  CBC Latest Ref Rng & Units 11/01/2019 09/22/2019 09/21/2019  WBC 4.0 - 10.5 K/uL 9.2 9.5 9.5  Hemoglobin 13.0 - 17.0 g/dL 13.6 12.6(L) 13.2  Hematocrit 39 - 52 % 44.2 38.4(L) 40.1  Platelets 150 - 400 K/uL 268 236 220   CMP Latest Ref Rng & Units 11/01/2019 09/22/2019 09/21/2019  Glucose 70 - 99 mg/dL 89 88 90  BUN 8 - 23 mg/dL 16 25(H) 21  Creatinine 0.61 - 1.24 mg/dL 1.29(H) 1.07 1.07  Sodium 135 - 145 mmol/L 138 137 139  Potassium 3.5 - 5.1 mmol/L 4.7 3.9 3.9  Chloride 98 - 111 mmol/L 102 105 108  CO2 22 - 32 mmol/L 28 23 20(L)  Calcium 8.9 - 10.3 mg/dL 9.2 8.5(L) 8.2(L)  Total Protein 6.5 - 8.1 g/dL 7.9 - -  Total Bilirubin 0.3 - 1.2 mg/dL 1.0 - -  Alkaline Phos 38 - 126 U/L 107 - -  AST 15 - 41 U/L 26 - -  ALT 0 - 44 U/L 19 - -       Micro Results Recent Results (from the past 240 hour(s))  Respiratory Panel by RT PCR (Flu A&B, Covid) - Nasopharyngeal Swab     Status: None   Collection Time: 11/01/19 10:57 PM   Specimen: Nasopharyngeal Swab  Result Value Ref Range Status   SARS Coronavirus 2 by RT PCR NEGATIVE NEGATIVE Final    Comment: (NOTE) SARS-CoV-2 target nucleic acids are NOT DETECTED.  The SA   Influenza A by PCR NEGATIVE NEGATIVE Final   Influenza B by PCR NEGATIVE NEGATIVE Final    Comment: (NOTE) The Xpert Xpress SARS-CoV-2/FLU/RSV assay is intended as an aid in   the diagnosis of influenza from Nasopharyngeal swab specimens and  should not be used as a sole basis for treatment. Nasal washings and  aspirates are unacceptable for Xpert Xpress SARS Performed at [facility], [address], [place], [place] [address]     Radiology Reports DG Chest 1 View  Result Date: 11/01/2019 CLINICAL DATA:  Increased weakness and altered mental status. EXAM: CHEST  1 VIEW COMPARISON:  May 03, 2019 FINDINGS: The heart size and mediastinal contours are within normal limits. Both lungs are clear. Multilevel degenerative changes seen within the mid and lower thoracic spine. IMPRESSION: No acute cardiopulmonary disease. Electronically Signed   By: Virgina Norfolk M.D.   On: 11/01/2019 22:41   CT Head Wo Contrast  Result Date: 11/01/2019 CLINICAL DATA:  Mental status change, unknown callus. EXAM: CT HEAD WITHOUT CONTRAST TECHNIQUE: Contiguous axial images were obtained from the base of the skull through the vertex without intravenous contrast. COMPARISON:  Head CT 09/19/2019 FINDINGS: Brain: Stable degree of atrophy and chronic small vessel ischemia. Remote lacunar infarct in the right greater than left basal ganglia. No evidence of acute infarction, hemorrhage, hydrocephalus, extra-axial collection or mass lesion/mass effect. Vascular: Atherosclerosis of skullbase vasculature without hyperdense vessel or abnormal calcification. Skull: No fracture or focal lesion. Sinuses/Orbits: Remote medial left orbital fracture. Bilateral cataract resection. Mastoid air cells are clear. No acute findings. Other: None. IMPRESSION: 1. No acute intracranial abnormality. 2. Stable atrophy and chronic small vessel ischemia. Remote lacunar infarcts in the basal ganglia. Electronically Signed   By: Keith Rake M.D.   On: 11/01/2019 23:29   MR BRAIN WO CONTRAST  Result Date: 11/02/2019 CLINICAL DATA:  Stroke suspected. EXAM: MRI HEAD WITHOUT CONTRAST TECHNIQUE: Multiplanar,  multiecho pulse sequences of the brain and surrounding structures were obtained without intravenous contrast. COMPARISON:  Same day head CT. FINDINGS: Brain: Acute infarct involving  the right corona radiata. Additional punctate infarct in the right parietal lobe white matter. Remote lacunar infarcts involving the bilateral thalami, bifrontal white matter, and pons. No acute hemorrhage. No substantial mass effect. Advanced T2/FLAIR hyperintensity in the periventricular white matter, compatible with chronic microvascular ischemic disease. Moderate diffuse cerebral volume loss with ex vacuo ventricular dilation. No hydrocephalus. Vascular: Proximal major intracranial arterial flow voids are maintained at the skull base. Skull and upper cervical spine: Normal marrow signal. Sinuses/Orbits: Remote left medial orbital wall fracture. Mild scattered paranasal sinus mucosal thickening without air-fluid levels. Other: No mastoid effusions. IMPRESSION: 1. Acute infarct in the right corona radiata. Additional punctate infarct in the right parietal lobe white matter. No substantial mass effect. 2. Remote lacunar infarcts involving the bilateral thalami, bifrontal white matter, and pons. 3. Extensive chronic microvascular ischemic disease. Electronically Signed   By: Margaretha Sheffield MD   On: 11/02/2019 15:11    SIGNED: Deatra James, MD, FACP, FHM. [facility],  Pager (please use [email or web address] to page/text)  If 7PM-7AM, please contact night-coverage [email or web address] 11/03/2019, 10:38 AM

## 2019-11-03 NOTE — ED Notes (Addendum)
Secure chat sent to Lang Snow, FNP for floor coverage, at this time no morning labs necessary. Also told to give PRN hydralazine.

## 2019-11-04 LAB — ECHOCARDIOGRAM COMPLETE
AR max vel: 2.52 cm2
AV Area VTI: 2.69 cm2
AV Area mean vel: 2.47 cm2
AV Mean grad: 3 mmHg
AV Peak grad: 6.1 mmHg
Ao pk vel: 1.23 m/s
Area-P 1/2: 2.73 cm2
Height: 70 in
S' Lateral: 2.9 cm
Weight: 2081.14 oz

## 2019-11-04 LAB — LIPID PANEL
Cholesterol: 144 mg/dL (ref 0–200)
HDL: 68 mg/dL (ref >40)
LDL Cholesterol: 61 mg/dL (ref 0–99)
Total CHOL/HDL Ratio: 2.1 RATIO
Triglycerides: 77 mg/dL (ref <150)
VLDL: 15 mg/dL (ref 0–40)

## 2019-11-04 LAB — HEMOGLOBIN A1C
Hgb A1c MFr Bld: 5.7 % — ABNORMAL HIGH (ref 4.8–5.6)
Mean Plasma Glucose: 116.89 mg/dL

## 2019-11-04 LAB — PSA: Prostatic Specific Antigen: 0.82 ng/mL (ref 0.00–4.00)

## 2019-11-04 MED ORDER — ENSURE ENLIVE PO LIQD
237.0000 mL | Freq: Two times a day (BID) | ORAL | Status: DC
  Administered 2019-11-05: 237 mL via ORAL

## 2019-11-04 NOTE — Progress Notes (Addendum)
Neurology Progress Note  Patient ID: [name] [name] is a 79 y.o. with PMHx of spinal stenosis, hypertension, hypercholesterolemia who presents with increased falls. Initially consulted for:  Left-sided weakness, found to have right corona radiata stroke  Major interval events:  - workup completed  - per review of primary team notes, back to baseline   Pertinent Labs / Imaging: MRA head was completed and fairly motion limited, personally reviewed Carotid ultrasounds revealed no hemodynamically significant stenosis Lipid panel cholesterol 144, LDL 61, HDL 68, triglycerides 77, VLDL 15 A1c 5.7% ECHO normal   Impression: Right corona radiata stroke likely due to small vessel risk factors, though currently fairly well controlled has uptrending blood glucose and possible contribution of UTI   Recommendations: - Continue DAPT with aspirin and Plavix for 3 weeks followed by monotherapy with Plavix 75 mg daily; if cost is an issue may use aspirin 81 mg daily  - Risk factor modification; counsel on pre-diabetes diagnosis  - Continue rosuvastatin 40 mg nightly   - 30 day event monitor on discharge  - Outpatient neurology follow-up in ~3 weeks - Neurology will sign off at this time  [place] [phone]

## 2019-11-04 NOTE — ED Notes (Signed)
This RN attempted to call report at this time.  

## 2019-11-04 NOTE — ED Notes (Signed)
Speech at bedside

## 2019-11-04 NOTE — Evaluation (Signed)
Physical Therapy Evaluation Patient Details Name: [name] [name] MRN: [id number] DOB: 23-Aug-1940 Today's Date: 11/04/2019   History of Present Illness  Lannie 'Bill" Tuzzolino is a 54yoM PMH: LSS, HTN, who comes [facility] on [facility]/02/19 after several falls. In ED, MRI shows acute Rt sided corona radiata infarct. Pt also being evaluated for potential accidental opiate overdose and UTI. At baseline pt lives alone, uses RW or [id number], falls frquently.  Clinical Impression  Pt admitted with above diagnosis. Pt currently with functional limitations due to the deficits listed below (see "PT Problem List"). Upon entry, pt in bed, awake and agreeable to participate. Pt requires max effort to come to EOB, minA to rise to standing, tolerates only ~35ft AMB c RW, then sits with a flop pulling RW into the bed with him. Pt has difficulty moving LLE in gait and back into bed, both of which create safety limitations. Pt generally has limited safety awareness and intermittent unsafe RW usage. Pt was falling "daily" PTA, but I fear these new CVA related deficits may further exacerbate falls. Pt educated about how a STR stay could help restore strength in the LLE and allow for eventual safe transition back to home. Functional mobility assessment demonstrates increased effort/time requirements, poor tolerance, and need for physical assistance, whereas the patient performed these at a higher level of independence PTA. Pt will benefit from skilled PT intervention to increase independence and safety with basic mobility in preparation for discharge to the venue listed below.       Follow Up Recommendations SNF;Supervision for mobility/OOB    Equipment Recommendations  None recommended by PT    Recommendations for Other Services       Precautions / Restrictions Precautions Precautions: Fall Restrictions Weight Bearing Restrictions: No      Mobility  Bed Mobility Overal bed mobility: Needs Assistance Bed  Mobility: Supine to Sit;Sit to Supine     Supine to sit: Supervision;HOB elevated Sit to supine: Mod assist   General bed mobility comments: requires 60-90sec to get to EOB, very labored; cannot get his LLE off floor for return to supine  Transfers Overall transfer level: Needs assistance Equipment used: Rolling walker (2 wheeled) Transfers: Sit to/from Stand Sit to Stand: Min assist         General transfer comment: attempts multiple hand placement option on RW (most of which are not well thought out or safe, unable to generate enough force to intitiate standing. Upon sitting, flops and pulls walker into bed  Ambulation/Gait   Gait Distance (Feet): 24 Feet Assistive device: Rolling walker (2 wheeled)       General Gait Details: very low, dragging LLE a bit, average step length is 2-3 inches bilat. LLE remains 1 step behind.  Stairs            Wheelchair Mobility    Modified Rankin (Stroke Patients Only)       Balance Overall balance assessment: Needs assistance Sitting-balance support: Single extremity supported;Feet supported Sitting balance-Leahy Scale: Fair Sitting balance - Comments: slight R lateral lean but with cues will self correct Postural control: Right lateral lean Standing balance support: Bilateral upper extremity supported Standing balance-Leahy Scale: Poor                               Pertinent Vitals/Pain Pain Assessment: No/denies pain    Home Living Family/patient expects to be discharged to:: Private residence Living Arrangements: Children (son) Available Help  at Discharge: Family;Available PRN/intermittently Type of Home: House Home Access: Stairs to enter Entrance Stairs-Rails: Right (but it's "wiggly" per pt) Entrance Stairs-Number of Steps: 4 Home Layout: One level Home Equipment: Walker - 4 wheels;Cane - single point;Walker - 2 wheels Additional Comments: Keeps one walker in house, the other in car. Report that  most of falls occur with getting in/out of vehicle due to slope/grade of terraine.    Prior Function Level of Independence: Needs assistance   Gait / Transfers Assistance Needed: pt reports primarily using rollator for mobility but does use SPC come  ADL's / Homemaking Assistance Needed: pt reports driving and able to run errands but recently requiring increased assist for tub transfers and bathing  Comments: Pt endorses falling daily     Hand Dominance   Dominant Hand: Right    Extremity/Trunk Assessment   Upper Extremity Assessment Upper Extremity Assessment: Generalized weakness;Overall [facility] for tasks assessed    Lower Extremity Assessment Lower Extremity Assessment: LLE deficits/detail;Overall WFL for tasks assessed LLE Deficits / Details: grossly 4+/5, but notable weakness in gait, the foot getting stuck a few times       Communication   Communication: No difficulties  Cognition Arousal/Alertness: Awake/alert Behavior During Therapy: Flat affect Overall Cognitive Status: No family/caregiver present to determine baseline cognitive functioning Area of Impairment: Problem solving;Awareness;Safety/judgement                         Safety/Judgement: Decreased awareness of safety;Decreased awareness of deficits   Problem Solving: Slow processing;Decreased initiation General Comments: Pt alert and oriented x3, increased processing time/problem solving/initiation, mild confusion noted      General Comments      Exercises Other Exercises Other Exercises: Pt tolerates sitting EOB >5 min without LOB but with slight R lateral lean which he only corrects with cues   Assessment/Plan    PT Assessment Patient needs continued PT services  PT Problem List Decreased strength;Decreased range of motion;Decreased activity tolerance;Decreased balance       PT Treatment Interventions DME instruction;Gait training;Stair training;Functional mobility training;Therapeutic  activities;Balance training;Neuromuscular re-education;Patient/family education;Therapeutic exercise    PT Goals (Current goals can be found in the Care Plan section)  Acute Rehab PT Goals Patient Stated Goal: improve LLE strength PT Goal Formulation: With patient Time For Goal Achievement: 11/18/19 Potential to Achieve Goals: Fair    Frequency Min 2X/week   Barriers to discharge Inaccessible home environment      Co-evaluation               AM-PAC PT "6 Clicks" Mobility  Outcome Measure Help needed turning from your back to your side while in a flat bed without using bedrails?: A Lot Help needed moving from lying on your back to sitting on the side of a flat bed without using bedrails?: A Lot Help needed moving to and from a bed to a chair (including a wheelchair)?: A Lot Help needed standing up from a chair using your arms (e.g., wheelchair or bedside chair)?: A Lot Help needed to walk in hospital room?: A Lot Help needed climbing 3-5 steps with a railing? : A Lot 6 Click Score: 12    End of Session Equipment Utilized During Treatment: Gait belt Activity Tolerance: Patient tolerated treatment well;No increased pain;Patient limited by fatigue Patient left: in bed;with call bell/phone within reach   PT Visit Diagnosis: Difficulty in walking, not elsewhere classified (R26.2);Other abnormalities of gait and mobility (R26.89);Unsteadiness on feet (R26.81)  Time: 9611-6435 PT Time Calculation (min) (ACUTE ONLY): 25 min   Charges:   PT Evaluation $PT Eval Moderate Complexity: 1 Mod          12:07 PM, 11/04/19 Etta Grandchild, PT, DPT Physical Therapist - [facility]  [phone] ([place])   [place] 11/04/2019, 12:02 PM

## 2019-11-04 NOTE — Telephone Encounter (Signed)
Pt is admitted

## 2019-11-04 NOTE — Evaluation (Signed)
Occupational Therapy Evaluation Patient Details Name: [name] [name] MRN: [id number] DOB: [birth year] Today's Date: 11/04/2019    History of Present Illness [name] [name] is a 75yoM PMH: LSS, HTN, who comes to [facility] on 11/02/19 after several falls. In ED, MRI shows acute Rt sided corona radiata infarct. Pt also being evaluated for potential accidental opiate overdose and UTI.   Clinical Impression   Pt seen for OT evaluation this date. Pt alert, oriented to self, place, and time, follows commands with cues and increased processing time/time to initiate. Pt reports living with his son and generally able to drive and run errands but recently requiring increased assist at home for bathing. Pt reports falling daily. Pt presents with impairments in strength, coordination, LUE elbow ROM (hx of old injury), balance, cognition, and activity tolerance requiring increased assist for pt to perform ADL mobility and LB ADL. Pt currently requires Min-Mod A for LB ADL tasks, Min A for ADL transfers and mobility with HHA. Pt will benefit from skilled OT Services to maximize return to PLOF and minimize risk of future falls leading to injury, readmission, and/or caregiver burden. Recommend SNF at this time. MD notified.    Follow Up Recommendations  SNF    Equipment Recommendations  3 in 1 bedside commode    Recommendations for Other Services       Precautions / Restrictions Precautions Precautions: Fall Restrictions Weight Bearing Restrictions: No      Mobility Bed Mobility Overal bed mobility: Needs Assistance Bed Mobility: Supine to Sit;Sit to Supine     Supine to sit: Min assist;HOB elevated Sit to supine: Mod assist      Transfers Overall transfer level: Needs assistance Equipment used: 1 person hand held assist Transfers: Sit to/from Stand Sit to Stand: Min assist         General transfer comment: cues for hand/foot placement, slow to initate    Balance Overall balance  assessment: Needs assistance Sitting-balance support: Single extremity supported;Feet supported Sitting balance-Leahy Scale: Fair Sitting balance - Comments: slight R lateral lean but with cues will self correct Postural control: Right lateral lean Standing balance support: Single extremity supported;During functional activity Standing balance-Leahy Scale: Fair                             ADL either performed or assessed with clinical judgement   ADL Overall ADL's : Needs assistance/impaired                                       General ADL Comments: Pt currently requires Min-Mod A for LB ADL tasks, Min A for ADL transfers and mobility     Vision Baseline Vision/History: Wears glasses Wears Glasses: At all times (pt reports he does not have his glasses with him) Patient Visual Report: No change from baseline Vision Assessment?: No apparent visual deficits     Perception     Praxis      Pertinent Vitals/Pain Pain Assessment: No/denies pain     Hand Dominance Right   Extremity/Trunk Assessment Upper Extremity Assessment Upper Extremity Assessment: Generalized weakness (hx L elbow injury resulting in decr elbow AROM)   Lower Extremity Assessment Lower Extremity Assessment: Generalized weakness (pt endorses hx of decr sensation to LLE 2/2 neuropathy)       Communication Communication Communication: No difficulties   Cognition Arousal/Alertness: Awake/alert Behavior During Therapy:  Flat affect Overall Cognitive Status: No family/caregiver present to determine baseline cognitive functioning                                 General Comments: Pt alert and oriented x3, increased processing time/problem solving/initiation, mild confusion noted   General Comments       Exercises Other Exercises Other Exercises: Pt tolerates sitting EOB >5 min without LOB but with slight R lateral lean which he only corrects with cues   Shoulder  Instructions      Home Living Family/patient expects to be discharged to:: Private residence Living Arrangements: Children (son) Available Help at Discharge: Family;Available PRN/intermittently Type of Home: House Home Access: Stairs to enter [employer] of Steps: 4 Entrance Stairs-Rails: Right (but it's "wiggly" per pt) Home Layout: One level     Bathroom Shower/Tub: Tub/shower unit;Curtain   Biochemist, clinical: Standard     Home Equipment: Environmental consultant - 4 wheels;Cane - single point          Prior Functioning/Environment Level of Independence: Needs assistance  Gait / Transfers Assistance Needed: pt reports primarily using rollator for mobility but does use SPC come ADL's / Homemaking Assistance Needed: pt reports driving and able to run errands but recently requiring increased assist for tub transfers and bathing   Comments: Pt endorses falling daily        OT Problem List: Decreased strength;Decreased coordination;Decreased range of motion;Decreased cognition;Impaired sensation;Decreased activity tolerance;Decreased safety awareness;Impaired balance (sitting and/or standing);Decreased knowledge of use of DME or AE;Impaired UE functional use      OT Treatment/Interventions: Self-care/ADL training;Therapeutic exercise;Therapeutic activities;Neuromuscular education;DME and/or AE instruction;Patient/family education;Balance training    OT Goals(Current goals can be found in the care plan section) Acute Rehab OT Goals Patient Stated Goal: to go home OT Goal Formulation: With patient Time For Goal Achievement: 11/18/19 Potential to Achieve Goals: Fair ADL Goals Pt Will Perform Lower Body Dressing: sit to/from stand;with min assist Pt Will Transfer to Toilet: with supervision;ambulating;bedside commode (LRAD) Additional ADL Goal #1: Pt/family will verbalize plan to implement at least 2 learned falls prevention strategies to minimize falls risk.  OT Frequency: Min  2X/week   Barriers to D/C:            Co-evaluation              AM-PAC OT "6 Clicks" Daily Activity     Outcome Measure Help from another person eating meals?: A Little Help from another person taking care of personal grooming?: A Little Help from another person toileting, which includes using toliet, bedpan, or urinal?: A Little Help from another person bathing (including washing, rinsing, drying)?: A Little Help from another person to put on and taking off regular upper body clothing?: A Little Help from another person to put on and taking off regular lower body clothing?: A Little 6 Click Score: 18   End of Session Equipment Utilized During Treatment: Gait belt  Activity Tolerance: Patient tolerated treatment well Patient left: in bed;with call bell/phone within reach;Other (comment) (PT in room for assessment)  OT Visit Diagnosis: Other abnormalities of gait and mobility (R26.89)                Time: 6269-4854 OT Time Calculation (min): 30 min Charges:  OT General Charges $OT Visit: 1 Visit OT Evaluation $OT Eval Moderate Complexity: 1 Mod OT Treatments $Therapeutic Activity: 8-22 mins  Jeni Salles, MPH, MS, OTR/L ascom [phone]  11/04/19, 10:59 AM

## 2019-11-04 NOTE — Evaluation (Addendum)
Clinical/Bedside Swallow Evaluation Patient Details  Name: [name] [name] MRN: [id number] Date of Birth: [birth year]  Today's Date: 11/04/2019 Time: SLP Start Time (ACUTE ONLY): 1120 SLP Stop Time (ACUTE ONLY): 1145 SLP Time Calculation (min) (ACUTE ONLY): 25 min  Past Medical History:  Past Medical History:  Diagnosis Date  . Back pain   . Renal disorder    kidney stones  . Spinal stenosis, lumbar region, with neurogenic claudication 07/07/2014   Past Surgical History:  Past Surgical History:  Procedure Laterality Date  . CATARACT EXTRACTION W/PHACO Left 08/22/2016   Procedure: CATARACT EXTRACTION PHACO AND INTRAOCULAR LENS PLACEMENT ([place]) Left;  Surgeon: [name], MD;  Location: [place];  Service: Ophthalmology;  Laterality: Left;  . CATARACT EXTRACTION W/PHACO Right 10/25/2016   Procedure: CATARACT EXTRACTION PHACO AND INTRAOCULAR LENS PLACEMENT ([place]) WITH ISTENT RIGHT  ;  Surgeon: [name], MD;  Location: [place];  Service: Ophthalmology;  Laterality: Right;  TOPICAL RIGHT  ISTENT   trabecular bypass stent was implanted/explanted.  . INGUINAL HERNIA REPAIR    . KNEE SURGERY Right   . NECK SURGERY     HPI:  79yo male admitted 11/01/19 with AMS/confusion and possible overdose. PMH: lumbar spine stenosis, chronic back pain, kidney stones, urolithiasis. CTHead = atrophy, remote lacunar infarcts in the basal ganglia, no acute abnormaliuties. MRI = ischemic infarct right corona radiata, punctate infarct right parietal lobe, remote lacunar infarcts involving the bilateral thalami, bifrontal white matter, pons, extensive chronic microvascular ischemic disease.  Assessment / Plan / Recommendation Clinical Impression  Pt was seen at bedside for assessment of swallow function and identification of PO diet. Pt was awake and alert, seated in bed. Generalized oral motor weakness noted, along with missing/poor dentition. Pt reports no difficulty  swallowing prior to admit, and indicated tolerance of regular foods and thin liquids. Pt accepted trials of thin liquid, puree, and solid textures. He was able to self feed. Oral stage appeared [facility] without anterior leakage or oral residue. No overt s/s aspiration observed on any texture. Will begin Dys 2 (finely chopped) diet and thin liquids, primarily for energy conservation. SLP will follow to assess diet tolerance and determine readiness to advance solids. RN informed.   SLP Visit Diagnosis: Dysphagia, unspecified (R13.10)    Aspiration Risk  Mild aspiration risk;Risk for inadequate nutrition/hydration    Diet Recommendation Thin liquid;Dysphagia 2 (Fine chop)   Liquid Administration via: Cup;Straw Medication Administration: Whole meds with liquid Supervision: Patient able to self feed;Staff to assist with self feeding Compensations: Minimize environmental distractions;Slow rate;Small sips/bites Postural Changes: Seated upright at 90 degrees;Remain upright for at least 30 minutes after po intake    Other  Recommendations Oral Care Recommendations: Oral care BID   Follow up Recommendations Skilled Nursing facility;24 hour supervision/assistance      Frequency and Duration min 1 x/week  1 week;2 weeks       Prognosis Prognosis for Safe Diet Advancement: Fair Barriers to Reach Goals: Cognitive deficits      Swallow Study   General Date of Onset: 11/01/19 HPI: 79yo male admitted 11/01/19 with AMS/confusion and possible overdose. PMH: lumbar spine stenosis, chronic back pain, kidney stones, urolithiasis. CTHead = atrophy, remote lacunar infarcts in the basal ganglia, no acute abnormalities. MRI = ischemic infarct right corona radiata, punctate infarct right parietal lobe, remote lacnuar infarcts involving the bilater thalami, bifrontal white matter, pons, extensive chronic microvascular ischemic disease Type of Study: Bedside Swallow Evaluation Previous Swallow Assessment: none Diet  Prior to this Study: NPO Temperature Spikes Noted: No Respiratory Status: Room air History of Recent Intubation: No Behavior/Cognition: Alert;Cooperative;Pleasant mood Oral Cavity Assessment: Within Functional Limits Oral Care Completed by SLP: No Oral Cavity - Dentition: Poor condition;Missing dentition Vision: Functional for self-feeding Self-Feeding Abilities: Able to feed self;Needs assist Patient Positioning: Upright in bed Baseline Vocal Quality: Normal Volitional Cough: Cognitively unable to elicit Volitional Swallow: Unable to elicit    Oral/Motor/Sensory Function Overall Oral Motor/Sensory Function: Generalized oral weakness   Thin Liquid Thin Liquid: Within functional limits Presentation: Straw    Puree Puree: Within functional limits Presentation: Spoon   Solid     Solid: Within functional limits     [name] [name] B. [name] [name], [place], [place] Speech Language Pathologist  [name] [name] 11/04/2019,11:59 AM

## 2019-11-04 NOTE — Evaluation (Addendum)
Speech Language Pathology Evaluation Patient Details Name: [name] [name] MRN: [id number] DOB: 24-Mar-1940 Today's Date: 11/04/2019 Time: 0623-7628 SLP Time Calculation (min) (ACUTE ONLY): 25 min  Problem List:  Patient Active Problem List   Diagnosis Date Noted  . CVA (cerebral vascular accident) ([place]) 11/03/2019  . AMS (altered mental status) 11/02/2019  . Acute lower UTI 11/02/2019  . Weakness 09/20/2019  . Protein-calorie malnutrition, severe 09/20/2019  . AKI (acute kidney injury) ([place]) 09/19/2019  . Ambulatory dysfunction 09/19/2019  . Dehydration 09/19/2019  . Fall at home, initial encounter 09/19/2019  . Aortic atherosclerosis ([place]) 02/16/2019  . Hypertension, essential, benign 08/13/2018  . Urinary frequency 02/05/2018  . PAD (peripheral artery disease) ([facility])-Mild 12/26/2017  . Chronic pain disorder 11/17/2017  . Unstable gait 11/17/2017  . BPH associated with nocturia 09/26/2017  . Allergic rhinitis 06/09/2017  . Tobacco use disorder 02/14/2017  . Generalized osteoarthritis of multiple sites 02/14/2017  . Bilateral lower extremity edema 08/12/2016  . Chronic right-sided low back pain with right-sided sciatica 03/14/2016  . Chronic pain of right knee 03/14/2016  . DDD (degenerative disc disease), lumbar 07/07/2014  . DDD (degenerative disc disease), cervical 07/07/2014  . DJD (degenerative joint disease) of knee 07/07/2014  . Bilateral occipital neuralgia 07/07/2014  . Spinal stenosis, lumbar region, with neurogenic claudication 07/07/2014   Past Medical History:  Past Medical History:  Diagnosis Date  . Back pain   . Renal disorder    kidney stones  . Spinal stenosis, lumbar region, with neurogenic claudication 07/07/2014   Past Surgical History:  Past Surgical History:  Procedure Laterality Date  . CATARACT EXTRACTION W/PHACO Left 08/22/2016   Procedure: CATARACT EXTRACTION PHACO AND INTRAOCULAR LENS PLACEMENT ([place]) Left;  Surgeon: [name],  MD;  Location: [place];  Service: Ophthalmology;  Laterality: Left;  . CATARACT EXTRACTION W/PHACO Right 10/25/2016   Procedure: CATARACT EXTRACTION PHACO AND INTRAOCULAR LENS PLACEMENT ([place]) WITH ISTENT RIGHT  ;  Surgeon: [name], MD;  Location: [place];  Service: Ophthalmology;  Laterality: Right;  TOPICAL RIGHT  ISTENT   trabecular bypass stent was implanted/explanted.  . INGUINAL HERNIA REPAIR    . KNEE SURGERY Right   . NECK SURGERY     HPI:  79yo male admitted 11/01/19 with AMS/confusion and possible overdose. PMH: lumbar spine stenosis, chronic back pain, kidney stones, urolithiasis. CTHead = atrophy, remote lacunar infarcts in the basal ganglia, no acute abnormalities. MRI = ischemic infarct right corona radiata, punctate infarct right parietal lobe, remote lacunar infarcts involving the bilateral thalami, bifrontal white matter, pons, extensive chronic microvascular ischemic disease.  Assessment / Plan / Recommendation Clinical Impression  The Mini-Mental State Examination (MMSE) was administered. Pt scored 23/30, indicating mild cognitive impairment. Pt exhibited difficulty on attention, delayed recall, following 3-step command, and copying task. He also had significant difficulty with clock drawing task. These deficits raise concern for pt safety at home if left alone. Pt speech and language skilled appear to be intact. 24 hour supervision is recommended at DC, with speech therapy services at next level of care.    SLP Assessment  SLP Recommendation/Assessment: All further Speech Language Pathology needs can be addressed in the next venue of care  SLP Visit Diagnosis: Cognitive communication deficit (R41.841)    Follow Up Recommendations  Skilled Nursing facility;24 hour supervision/assistance       SLP Evaluation Cognition  Overall Cognitive Status: No family/caregiver present to determine baseline cognitive functioning Orientation Level:  Oriented to person;Oriented to place;Oriented  to time;Disoriented to situation       Comprehension  Auditory Comprehension Overall Auditory Comprehension: Appears within functional limits for tasks assessed Reading Comprehension Reading Status: Within funtional limits (phrase length material)    Expression Expression Primary Mode of Expression: Verbal Verbal Expression Overall Verbal Expression: Appears within functional limits for tasks assessed Written Expression Dominant Hand: Right Written Expression: Within Functional Limits (Sentence length material)   Oral / Motor  Oral Motor/Sensory Function Overall Oral Motor/Sensory Function: Generalized oral weakness Motor Speech Overall Motor Speech: Appears within functional limits for tasks assessed   GO                   [name] B. [name], [facility], [name] Speech Language Pathologist  Shonna Chock 11/04/2019, 12:30 PM

## 2019-11-04 NOTE — Progress Notes (Signed)
PROGRESS NOTE    Patient: [name] [name]                            PCP: [name], [name], MD                    DOB: [birth year]            DOA: 11/01/2019 XIP:[id number]             DOS: 11/04/2019, 12:56 PM   LOS: 1 day   Date of Service: The patient was seen and examined on 11/04/2019  Subjective:   The patient was seen and examined this morning, alert oriented x3 much more awake alert. Much improved mental status today Complaining of no asymmetric weakness or speech difficulties this morning Remaining in bed Patient was informed of new finding on MRI consistent with stroke State he does not take aspirin and Plavix at home -reports of no history of stroke   Brief Narrative:   Reon Hunley  is a 79 y.o. African-American male with a known history of lumbar spine stenosis with chronic back pain and urolithiasis, who presented to the emergency room with acute onset of altered mental status with confusion with suspected accidental overdose with opiates.    Per EMS they were called out by his daughter who was worried about the possibility of overdose.He was found    slumped over wheelchair drooling and not speaking.  He had coarse breath sounds and they give him IV Solu-Medrol and nebulizer treatment that did not change his mental status.  Blood glucose was within normal.    ED BP 159/91 with otherwise normal vital signs.  Labs revealed ABG with pH 7.28 with PCO2 of 71 and HCO3 33.4.  BUN was 16 and creatinine 1.29 magnesium was 1.9.  High-sensitivity troponin was 16 and later 15.  CBC was unremarkable Tylenol levels less than 10 and salicylate less than 7 with TSH 2.6.  Respiratory panel came back negative including COVID-19 PCR.   UA showed more than 50 RBCs and more than 50 WBCs.  Urine drug screen came back negative.   Noncontrasted head CT scan revealed stable atrophy and chronic small vessel ischemic disease with remote lacunar infarcts in the basal ganglia with no acute  intracranial normalities.  EKG showed sinus rhythm with rate of 64 with probable left atrial enlargement and right bundle branch block as well as ureter Q waves.   Assessment & Plan:   Principal Problem:   CVA (cerebral vascular accident) ([place]) Active Problems:   Spinal stenosis, lumbar region, with neurogenic claudication   Generalized osteoarthritis of multiple sites   Hypertension, essential, benign   Ambulatory dysfunction   Weakness   AMS (altered mental status)   Acute lower UTI   Acute CVA -According to the history symptoms started more than a week ago -Which may have contributed to encephalopathy -Labs reviewed within normal limits including  TSH 2.6 A1c 5.7 -LDL 57 -MRI of the brain was reviewed:  IMPRESSION: 1. Acute infarct in the right corona radiata. Additional punctate infarct in the right parietal lobe white matter. No substantial mass effect. 2. Remote lacunar infarcts involving the bilateral thalami, bifrontal white matter, and pons. 3. Extensive chronic microvascular ischemic disease.  MRI reviewed: IMPRESSION: Motion degraded examination. No emergent large vessel occlusion.   Neurology consulted--- recommended aspirin Plavix x3 weeks then continue with Plavix -Patient is outside the window for TPA... Not  a candidate -We will continue with statins -PT OT/speech evaluation -2D echocardiogram reviewed within normal limits   Acute metabolic encephalopathy -Multifactorial secondary to UTI, opioid overdose, CVA acute -Much improved back to baseline -We will continue to monitor, continue IV antibiotics IV fluids    Hypertension. -Stable we will continue to monitor     Dyslipidemia. --Increasing statin to high-dose - -Lipid panel reviewed LDL 57 Chronic back pain. -Holding off on narcotics at this time   DVT prophylaxis. -Subtenons Lovenox  Family discussion -none present at bedside  Time for me to discuss with daughter - The plan  of care was discussed in details with the patient (and family). .   CODE STATUS: Full code  Status is: Observation  The patient remains OBS appropriate and will d/c before 2 midnights.  Dispo: The patient is from: Home  Anticipated d/c is to: Home w [facility] vs SNF   Social worker consulted, appreciate assistance in disposition  Anticipated d/c date is: 1 day  Patient currently is not medically stable to d/c.  Continue with stroke work-up monitoring      Nutritional status:         Cultures; Urine Culture  >>> 60 K colonies of multiple species Repeat urine culture  Antimicrobials: IV Rocephin        Procedures:   No admission procedures for hospital encounter.    Antimicrobials:  Anti-infectives (From admission, onward)   None       Medication:  .  stroke: mapping our early stages of recovery book   Does not apply Once  . aspirin EC  81 mg Oral Daily  . clopidogrel  75 mg Oral Daily  . DULoxetine  60 mg Oral Daily  . enoxaparin (LOVENOX) injection  40 mg Subcutaneous Q24H  . furosemide  20 mg Oral Daily  . hydrochlorothiazide  12.5 mg Oral Daily  . losartan  50 mg Oral Daily  . nicotine  21 mg Transdermal Daily  . polyethylene glycol  34 g Oral BID  . rosuvastatin  40 mg Oral q1800    acetaminophen **OR** acetaminophen, hydrALAZINE, LORazepam, magnesium hydroxide, oxyCODONE, potassium chloride SA, traZODone   Objective:   Vitals:   11/04/19 0045 11/04/19 0615 11/04/19 0630 11/04/19 0906  BP:  (!) 156/84 (!) 152/95 121/85  Pulse: 85 70 67 84  Resp: 15 16 19 16   Temp:    98.1 F (36.7 C)  TempSrc:    Oral  SpO2: 97% 98% 99% 97%  Weight:      Height:        Intake/Output Summary (Last 24 hours) at 11/04/2019 1256 Last data filed at 11/03/2019 2204 Gross per 24 hour  Intake 0 ml  Output --  Net 0 ml   Filed Weights   11/01/19 2227  Weight: 59 kg     Examination:       Physical Exam:    General:  Alert, oriented, cooperative, no distress;   HEENT:  Normocephalic, PERRL, otherwise with in Normal limits   Neuro:   Speech intact, cognition intact, slow in response, symmetric generalized weaknesses specifically in her lower extremities  CNII-XII intact. , normal motor and sensation, reflexes intact   Lungs:   Clear to auscultation BL, Respirations unlabored, no wheezes / crackles  Cardio:    S1/S2, RRR, No murmure, No Rubs or Gallops   Abdomen:   Soft, non-tender, bowel sounds active all four quadrants,  no guarding or peritoneal signs.  Muscular skeletal:   Generalized  symmetrical weakness, lower extremity weakness symmetric  Limited exam - in bed, able to move all 4 extremities, Normal strength,  2+ pulses,  symmetric, No pitting edema  Skin:  Dry, warm to touch, negative for any Rashes, No open wounds  Wounds: Please see nursing documentation                ------------------------------------------------------------------------------------------------------------------------------------------    LABs:  CBC Latest Ref Rng & Units 11/01/2019 09/22/2019 09/21/2019  WBC 4.0 - 10.5 K/uL 9.2 9.5 9.5  Hemoglobin 13.0 - 17.0 g/dL 13.6 12.6(L) 13.2  Hematocrit 39 - 52 % 44.2 38.4(L) 40.1  Platelets 150 - 400 K/uL 268 236 220   CMP Latest Ref Rng & Units 11/01/2019 09/22/2019 09/21/2019  Glucose 70 - 99 mg/dL 89 88 90  BUN 8 - 23 mg/dL 16 25(H) 21  Creatinine 0.61 - 1.24 mg/dL 1.29(H) 1.07 1.07  Sodium 135 - 145 mmol/L 138 137 139  Potassium 3.5 - 5.1 mmol/L 4.7 3.9 3.9  Chloride 98 - 111 mmol/L 102 105 108  CO2 22 - 32 mmol/L 28 23 20(L)  Calcium 8.9 - 10.3 mg/dL 9.2 8.5(L) 8.2(L)  Total Protein 6.5 - 8.1 g/dL 7.9 - -  Total Bilirubin 0.3 - 1.2 mg/dL 1.0 - -  Alkaline Phos 38 - 126 U/L 107 - -  AST 15 - 41 U/L 26 - -  ALT 0 - 44 U/L 19 - -       Micro Results Recent Results (from the past 240 hour(s))  Respiratory Panel by RT PCR (Flu A&B, Covid) -  Nasopharyngeal Swab     Status: None   Collection Time: 11/01/19 10:57 PM   Specimen: Nasopharyngeal Swab  Result Value Ref Range Status   SARS Coronavirus 2 by RT PCR NEGATIVE NEGATIVE Final    Comment: (NOTE) SARS-CoV-2 target nucleic acids are NOT DETECTED.  The SA   Influenza A by PCR NEGATIVE NEGATIVE Final   Influenza B by PCR NEGATIVE NEGATIVE Final    Comment: (NOTE) The Xpert Xpress SARS-CoV-2/FLU/RSV assay is intended as an aid in  the diagnosis of influenza from Nasopharyngeal swab specimens and  should not be used as a sole basis for treatment. Nasal washings and  aspirates are unacceptable for Xpert Xpress SARS Performed at [facility], [address], [place], [place] [address]     Radiology Reports DG Chest 1 View  Result Date: 11/01/2019 CLINICAL DATA:  Increased weakness and altered mental status. EXAM: CHEST  1 VIEW COMPARISON:  May 03, 2019 FINDINGS: The heart size and mediastinal contours are within normal limits. Both lungs are clear. Multilevel degenerative changes seen within the mid and lower thoracic spine. IMPRESSION: No acute cardiopulmonary disease. Electronically Signed   By: Virgina Norfolk M.D.   On: 11/01/2019 22:41   CT Head Wo Contrast  Result Date: 11/01/2019 CLINICAL DATA:  Mental status change, unknown callus. EXAM: CT HEAD WITHOUT CONTRAST TECHNIQUE: Contiguous axial images were obtained from the base of the skull through the vertex without intravenous contrast. COMPARISON:  Head CT 09/19/2019 FINDINGS: Brain: Stable degree of atrophy and chronic small vessel ischemia. Remote lacunar infarct in the right greater than left basal ganglia. No evidence of acute infarction, hemorrhage, hydrocephalus, extra-axial collection or mass lesion/mass effect. Vascular: Atherosclerosis of skullbase vasculature without hyperdense vessel or abnormal calcification. Skull: No fracture or focal lesion. Sinuses/Orbits: Remote medial left orbital fracture.  Bilateral cataract resection. Mastoid air cells are clear. No acute findings. Other: None. IMPRESSION: 1. No  acute intracranial abnormality. 2. Stable atrophy and chronic small vessel ischemia. Remote lacunar infarcts in the basal ganglia. Electronically Signed   By: Keith Rake M.D.   On: 11/01/2019 23:29   MR ANGIO HEAD WO CONTRAST  Result Date: 11/03/2019 CLINICAL DATA:  Stroke follow-up EXAM: MRA HEAD WITHOUT CONTRAST TECHNIQUE: Angiographic images of the Circle of Willis were obtained using MRA technique without intravenous contrast. COMPARISON:  Brain MRI 11/02/2019 FINDINGS: Examination is degraded by motion. POSTERIOR CIRCULATION: --Vertebral arteries: Normal right dominant configuration of V4 segments. --Inferior cerebellar arteries: Poorly visualized --Basilar artery: Normal. --Superior cerebellar arteries: Normal. --Posterior cerebral arteries: Normal. ANTERIOR CIRCULATION: --Intracranial internal carotid arteries: Normal. --Anterior cerebral arteries (ACA): Normal. Both A1 segments are present. Patent anterior communicating artery (a-comm). --Middle cerebral arteries (MCA): Normal. IMPRESSION: Motion degraded examination. No emergent large vessel occlusion. Electronically Signed   By: Ulyses Jarred M.D.   On: 11/03/2019 20:34   MR BRAIN WO CONTRAST  Result Date: 11/02/2019 CLINICAL DATA:  Stroke suspected. EXAM: MRI HEAD WITHOUT CONTRAST TECHNIQUE: Multiplanar, multiecho pulse sequences of the brain and surrounding structures were obtained without intravenous contrast. COMPARISON:  Same day head CT. FINDINGS: Brain: Acute infarct involving the right corona radiata. Additional punctate infarct in the right parietal lobe white matter. Remote lacunar infarcts involving the bilateral thalami, bifrontal white matter, and pons. No acute hemorrhage. No substantial mass effect. Advanced T2/FLAIR hyperintensity in the periventricular white matter, compatible with chronic microvascular ischemic  disease. Moderate diffuse cerebral volume loss with ex vacuo ventricular dilation. No hydrocephalus. Vascular: Proximal major intracranial arterial flow voids are maintained at the skull base. Skull and upper cervical spine: Normal marrow signal. Sinuses/Orbits: Remote left medial orbital wall fracture. Mild scattered paranasal sinus mucosal thickening without air-fluid levels. Other: No mastoid effusions. IMPRESSION: 1. Acute infarct in the right corona radiata. Additional punctate infarct in the right parietal lobe white matter. No substantial mass effect. 2. Remote lacunar infarcts involving the bilateral thalami, bifrontal white matter, and pons. 3. Extensive chronic microvascular ischemic disease. Electronically Signed   By: Margaretha Sheffield MD   On: 11/02/2019 15:11   US Carotid Bilateral  Result Date: 11/03/2019 CLINICAL DATA:  79 year old male with CVA EXAM: BILATERAL CAROTID DUPLEX ULTRASOUND TECHNIQUE: Pearline Cables scale imaging, color Doppler and duplex ultrasound were performed of bilateral carotid and vertebral arteries in the neck. COMPARISON:  None. FINDINGS: Criteria: Quantification of carotid stenosis is based on velocity parameters that correlate the residual internal carotid diameter with NASCET-based stenosis levels, using the diameter of the distal internal carotid lumen as the denominator for stenosis measurement. The following velocity measurements were obtained: RIGHT ICA:  Systolic 81 cm/sec, Diastolic 19 cm/sec CCA:  59 cm/sec SYSTOLIC ICA/CCA RATIO:  1.4 ECA:  89 cm/sec LEFT ICA:  Systolic 77 cm/sec, Diastolic 14 cm/sec CCA:  75 cm/sec SYSTOLIC ICA/CCA RATIO:  1.0 ECA:  91 cm/sec Right Brachial SBP: Not acquired Left Brachial SBP: Not acquired RIGHT CAROTID ARTERY: No significant calcifications of the right common carotid artery. Intermediate waveform maintained. Heterogeneous and partially calcified plaque at the right carotid bifurcation. No significant lumen shadowing. Low resistance  waveform of the right ICA. No significant tortuosity. RIGHT VERTEBRAL ARTERY: Antegrade flow with low resistance waveform. LEFT CAROTID ARTERY: No significant calcifications of the left common carotid artery. Intermediate waveform maintained. Heterogeneous and partially calcified plaque at the left carotid bifurcation without significant lumen shadowing. Low resistance waveform of the left ICA. No significant tortuosity. LEFT VERTEBRAL ARTERY:  Antegrade flow with low resistance waveform.  IMPRESSION: Color duplex indicates minimal heterogeneous and calcified plaque, with no hemodynamically significant stenosis by duplex criteria in the extracranial cerebrovascular circulation. Signed, Dulcy Fanny. Dellia Nims, RPVI [facility] Electronically Signed   By: Corrie Mckusick D.O.   On: 11/03/2019 10:42   ECHOCARDIOGRAM COMPLETE  Result Date: 11/04/2019    ECHOCARDIOGRAM REPORT   Patient Name:   CEDRIC MCCLAINE [facility] Date of Exam: 11/03/2019 Medical Rec #:  [id number]               Height:       70.0 in Accession #:    [id number]              Weight:       130.1 lb Date of Birth:  February 09, 1940               BSA:          1.739 m Patient Age:    79 years                BP:           133/88 mmHg Patient Gender: M                       HR:           70 bpm. Exam Location:  [facility] Procedure: 2D Echo Indications:     Stroke I163.9  History:         Patient has no prior history of Echocardiogram examinations.  Sonographer:     Arville Go RDCS Referring Phys:  [id number] Valeria Batman Rito Lecomte Diagnosing Phys: Buford Dresser MD IMPRESSIONS  1. Left ventricular ejection fraction, by estimation, is 60 to 65%. The left ventricle has normal function. The left ventricle has no regional wall motion abnormalities. Left ventricular diastolic parameters were normal.  2. Right ventricular systolic function is normal. The right ventricular size is normal.  3. The mitral valve is normal  in structure. Trivial mitral valve regurgitation. No evidence of mitral stenosis.  4. The aortic valve is grossly normal. Aortic valve regurgitation is not visualized. No aortic stenosis is present.  5. The inferior vena cava is normal in size with greater than 50% respiratory variability, suggesting right atrial pressure of 3 mmHg. Comparison(s): No prior Echocardiogram. Conclusion(s)/Recommendation(s): No intracardiac source of embolism detected on this transthoracic study. A transesophageal echocardiogram is recommended to exclude cardiac source of embolism if clinically indicated. FINDINGS  Left Ventricle: Left ventricular ejection fraction, by estimation, is 60 to 65%. The left ventricle has normal function. The left ventricle has no regional wall motion abnormalities. The left ventricular internal cavity size was normal in size. There is  no left ventricular hypertrophy. Left ventricular diastolic parameters were normal. Right Ventricle: The right ventricular size is normal. No increase in right ventricular wall thickness. Right ventricular systolic function is normal. Left Atrium: Left atrial size was normal in size. Right Atrium: Right atrial size was not well visualized. Pericardium: There is no evidence of pericardial effusion. Mitral Valve: The mitral valve is normal in structure. Trivial mitral valve regurgitation. No evidence of mitral valve stenosis. Tricuspid Valve: The tricuspid valve is normal in structure. Tricuspid valve regurgitation is trivial. No evidence of tricuspid stenosis. Aortic Valve: The aortic valve is grossly normal. Aortic valve regurgitation is not visualized. No aortic stenosis is present. Aortic valve mean gradient measures 3.0 mmHg. Aortic valve peak gradient measures 6.1 mmHg. Aortic valve area, by  VTI measures 2.69 cm. Pulmonic Valve: The pulmonic valve was not well visualized. Pulmonic valve regurgitation is not visualized. Aorta: The aortic root and ascending aorta are  structurally normal, with no evidence of dilitation. Venous: The inferior vena cava is normal in size with greater than 50% respiratory variability, suggesting right atrial pressure of 3 mmHg. IAS/Shunts: The atrial septum is grossly normal.  LEFT VENTRICLE PLAX 2D LVIDd:         4.26 cm  Diastology LVIDs:         2.89 cm  LV e' medial:    7.29 cm/s LV PW:         1.17 cm  LV E/e' medial:  7.1 LV IVS:        0.97 cm  LV e' lateral:   6.53 cm/s LVOT diam:     2.20 cm  LV E/e' lateral: 7.9 LV SV:         56 LV SV Index:   32 LVOT Area:     3.80 cm  RIGHT VENTRICLE RV Basal diam:  3.66 cm RV S prime:     18.80 cm/s TAPSE (M-mode): 2.8 cm LEFT ATRIUM             Index       RIGHT ATRIUM           Index LA diam:        2.80 cm 1.61 cm/m  RA Area:     16.90 cm LA Vol (A2C):   12.7 ml 7.31 ml/m  RA Volume:   50.30 ml  28.93 ml/m LA Vol (A4C):   24.7 ml 14.21 ml/m LA Biplane Vol: 18.5 ml 10.64 ml/m  AORTIC VALVE                   PULMONIC VALVE AV Area (Vmax):    2.52 cm    PV Vmax:       0.88 m/s AV Area (Vmean):   2.47 cm    PV Peak grad:  3.1 mmHg AV Area (VTI):     2.69 cm AV Vmax:           123.00 cm/s AV Vmean:          76.400 cm/s AV VTI:            0.209 m AV Peak Grad:      6.1 mmHg AV Mean Grad:      3.0 mmHg LVOT Vmax:         81.60 cm/s LVOT Vmean:        49.700 cm/s LVOT VTI:          0.148 m LVOT/AV VTI ratio: 0.71  AORTA Ao Root diam: 3.40 cm Ao Asc diam:  3.20 cm MITRAL VALVE               TRICUSPID VALVE MV Area (PHT): 2.73 cm    TV Peak grad:   32.9 mmHg MV Decel Time: 278 msec    TV Vmax:        2.87 m/s MV E velocity: 51.40 cm/s MV A velocity: 84.40 cm/s  SHUNTS MV E/A ratio:  0.61        Systemic VTI:  0.15 m                            Systemic Diam: 2.20 cm Buford Dresser MD Electronically signed by Buford Dresser MD Signature Date/Time: 11/04/2019/7:49:42 AM  Final     SIGNED: Deatra James, MD, FACP, FHM. [facility],  Pager (please use [email or web address] to  page/text)  If 7PM-7AM, please contact night-coverage [email or web address] 11/04/2019, 12:56 PM

## 2019-11-05 LAB — RESP PANEL BY RT PCR (RSV, FLU A&B, COVID)
Influenza A by PCR: NEGATIVE
Influenza B by PCR: NEGATIVE
Respiratory Syncytial Virus by PCR: NEGATIVE
SARS Coronavirus 2 by RT PCR: NEGATIVE

## 2019-11-05 MED ORDER — ENSURE ENLIVE PO LIQD
237.0000 mL | Freq: Three times a day (TID) | ORAL | Status: DC
  Administered 2019-11-05 – 2019-11-07 (×4): 237 mL via ORAL

## 2019-11-05 NOTE — TOC Initial Note (Signed)
Transition of Care [facility]) - Initial/Assessment Note    Patient Details  Name: [name] [name] MRN: [id number] Date of Birth: 1940-04-14  [facility] CM/SW Contact:    Shelbie Ammons, RN Phone Number: 11/05/2019, 1:33 PM  Clinical Narrative:    RNCM met with patient at bedside, patient sitting up in chair with sister [name] present. Discussed that at this time it is recommended that patient go to SNF at discharge due to need for 24 hour assistance and increased therapy. Patient verbalizing understanding and that he has recently been to [facility] but only stayed 2 days. He would be agreeable to return there but reports that he is ok with bed search of all area facilities. Sister in room reports that patient's adult son lives in home but does not provide assistance.  RNCM verified PASSR, completed FL-2 and started bed search.                Expected Discharge Plan: Skilled Nursing Facility Barriers to Discharge: No Barriers Identified   Patient Goals and CMS Choice     Choice offered to / list presented to : Patient  Expected Discharge Plan and Services Expected Discharge Plan: [place] Acute Care Choice: [place] Living arrangements for the past 2 months: Single Family Home                                      Prior Living Arrangements/Services Living arrangements for the past 2 months: Single Family Home Lives with:: Adult Children Patient language and need for interpreter reviewed:: Yes Do you feel safe going back to the place where you live?: Yes      Need for Family Participation in Patient Care: Yes (Comment) Care giver support system in place?: Yes (comment)   Criminal Activity/Legal Involvement Pertinent to Current Situation/Hospitalization: No - Comment as needed  Activities of Daily Living      Permission Sought/Granted                  Emotional Assessment Appearance:: Appears  older than stated age Attitude/Demeanor/Rapport: Engaged Affect (typically observed): Appropriate Orientation: : Oriented to Situation, Oriented to  Time, Oriented to Place, Oriented to Self Alcohol / Substance Use: Not Applicable Psych Involvement: No (comment)  Admission diagnosis:  CVA (cerebral vascular accident) ([place]) [I63.9] QT prolongation [R94.31] RBBB [I45.10] Accidental drug overdose, initial encounter [T50.901A] Altered mental status, unspecified altered mental status type [R41.82] AMS (altered mental status) [R41.82] Patient Active Problem List   Diagnosis Date Noted  . CVA (cerebral vascular accident) ([place]) 11/03/2019  . AMS (altered mental status) 11/02/2019  . Acute lower UTI 11/02/2019  . Weakness 09/20/2019  . Protein-calorie malnutrition, severe 09/20/2019  . AKI (acute kidney injury) ([place]) 09/19/2019  . Ambulatory dysfunction 09/19/2019  . Dehydration 09/19/2019  . Fall at home, initial encounter 09/19/2019  . Aortic atherosclerosis ([place]) 02/16/2019  . Hypertension, essential, benign 08/13/2018  . Urinary frequency 02/05/2018  . PAD (peripheral artery disease) ([facility])-Mild 12/26/2017  . Chronic pain disorder 11/17/2017  . Unstable gait 11/17/2017  . BPH associated with nocturia 09/26/2017  . Allergic rhinitis 06/09/2017  . Tobacco use disorder 02/14/2017  . Generalized osteoarthritis of multiple sites 02/14/2017  . Bilateral lower extremity edema 08/12/2016  . Chronic right-sided low back pain with right-sided sciatica 03/14/2016  . Chronic pain of right knee 03/14/2016  .  DDD (degenerative disc disease), lumbar 07/07/2014  . DDD (degenerative disc disease), cervical 07/07/2014  . DJD (degenerative joint disease) of knee 07/07/2014  . Bilateral occipital neuralgia 07/07/2014  . Spinal stenosis, lumbar region, with neurogenic claudication 07/07/2014   PCP:  [name], [name], MD Pharmacy:   [facility] [place], [place] - [address]  [facility] [address] [address] Phone: [phone] Fax: [phone]     Social Determinants of Health (SDOH) Interventions    Readmission Risk Interventions No flowsheet data found.

## 2019-11-05 NOTE — NC FL2 (Signed)
[place] LEVEL OF CARE SCREENING TOOL     IDENTIFICATION  Patient Name: [name] [name] Birthdate: 1940/09/16 Sex: male Admission Date (Current Location): 11/01/2019  [place] and [place] Number:  Engineering geologist and Address:  [facility], [address], [place], [place] [address]      Provider Number: [id number]  Attending Physician Name and Address:  Deatra James, MD  Relative Name and Phone Number:  Tyrese Capriotti [phone]    Current Level of Care: Hospital Recommended Level of Care: [place] Prior Approval Number:    Date Approved/Denied:   PASRR Number: [id number]  Discharge Plan: SNF    Current Diagnoses: Patient Active Problem List   Diagnosis Date Noted  . CVA (cerebral vascular accident) ([place]) 11/03/2019  . AMS (altered mental status) 11/02/2019  . Acute lower UTI 11/02/2019  . Weakness 09/20/2019  . Protein-calorie malnutrition, severe 09/20/2019  . AKI (acute kidney injury) ([place]) 09/19/2019  . Ambulatory dysfunction 09/19/2019  . Dehydration 09/19/2019  . Fall at home, initial encounter 09/19/2019  . Aortic atherosclerosis ([place]) 02/16/2019  . Hypertension, essential, benign 08/13/2018  . Urinary frequency 02/05/2018  . PAD (peripheral artery disease) (HCC)-Mild 12/26/2017  . Chronic pain disorder 11/17/2017  . Unstable gait 11/17/2017  . BPH associated with nocturia 09/26/2017  . Allergic rhinitis 06/09/2017  . Tobacco use disorder 02/14/2017  . Generalized osteoarthritis of multiple sites 02/14/2017  . Bilateral lower extremity edema 08/12/2016  . Chronic right-sided low back pain with right-sided sciatica 03/14/2016  . Chronic pain of right knee 03/14/2016  . DDD (degenerative disc disease), lumbar 07/07/2014  . DDD (degenerative disc disease), cervical 07/07/2014  . DJD (degenerative joint disease) of knee 07/07/2014  . Bilateral occipital neuralgia 07/07/2014  .  Spinal stenosis, lumbar region, with neurogenic claudication 07/07/2014    Orientation RESPIRATION BLADDER Height & Weight     Self, Time, Situation, Place  Normal Continent Weight: 59 kg Height:  5\' 10"  (177.8 cm)  BEHAVIORAL SYMPTOMS/MOOD NEUROLOGICAL BOWEL NUTRITION STATUS      Continent Diet (Dysphagia 2, Thin liquids)  AMBULATORY STATUS COMMUNICATION OF NEEDS Skin   Extensive Assist Verbally Normal                       Personal Care Assistance Level of Assistance  Bathing, Feeding, Dressing Bathing Assistance: Limited assistance Feeding assistance: Independent Dressing Assistance: Limited assistance     Functional Limitations Info  Sight, Hearing, Speech Sight Info: Adequate Hearing Info: Adequate Speech Info: Adequate    SPECIAL CARE FACTORS FREQUENCY  PT (By licensed PT), OT (By licensed OT)                    Contractures Contractures Info: Not present    Additional Factors Info  Code Status, Allergies Code Status Info: Full Allergies Info: No known allergies           Current Medications (11/05/2019):  This is the current hospital active medication list Current Facility-Administered Medications  Medication Dose Route Frequency Provider Last Rate Last Admin  .  stroke: mapping our early stages of recovery book   Does not apply Once Greta Doom, MD      . acetaminophen (TYLENOL) tablet 650 mg  650 mg Oral Q6H PRN Mansy, Jan A, MD       Or  . acetaminophen (TYLENOL) suppository 650 mg  650 mg Rectal Q6H PRN Mansy, Arvella Merles, MD      .  aspirin EC tablet 81 mg  81 mg Oral Daily Mansy, Jan A, MD   81 mg at 11/05/19 0934  . clopidogrel (PLAVIX) tablet 75 mg  75 mg Oral Daily Shahmehdi, Seyed A, MD   75 mg at 11/05/19 0934  . DULoxetine (CYMBALTA) DR capsule 60 mg  60 mg Oral Daily Mansy, Jan A, MD   60 mg at 11/05/19 0935  . enoxaparin (LOVENOX) injection 40 mg  40 mg Subcutaneous Q24H Mansy, Jan A, MD   40 mg at 11/05/19 0935  . feeding  supplement (ENSURE ENLIVE) (ENSURE ENLIVE) liquid 237 mL  237 mL Oral BID BM Shahmehdi, Seyed A, MD      . furosemide (LASIX) tablet 20 mg  20 mg Oral Daily Mansy, Jan A, MD   20 mg at 11/05/19 0934  . hydrALAZINE (APRESOLINE) injection 10 mg  10 mg Intravenous Q6H PRN Mansy, Jan A, MD   10 mg at 11/03/19 3893  . hydrochlorothiazide (HYDRODIURIL) tablet 12.5 mg  12.5 mg Oral Daily Mansy, Jan A, MD   12.5 mg at 11/05/19 0934  . LORazepam (ATIVAN) injection 0.5 mg  0.5 mg Intravenous Q6H PRN Shahmehdi, Seyed A, MD      . losartan (COZAAR) tablet 50 mg  50 mg Oral Daily Mansy, Jan A, MD   50 mg at 11/05/19 0934  . magnesium hydroxide (MILK OF MAGNESIA) suspension 30 mL  30 mL Oral Daily PRN Mansy, Jan A, MD      . nicotine (NICODERM CQ - dosed in mg/24 hours) patch 21 mg  21 mg Transdermal Daily Mansy, Jan A, MD   21 mg at 11/05/19 1000  . oxyCODONE (Oxy IR/ROXICODONE) immediate release tablet 10 mg  10 mg Oral BID PRN Mansy, Jan A, MD   10 mg at 11/02/19 1809  . polyethylene glycol (MIRALAX / GLYCOLAX) packet 34 g  34 g Oral BID Mansy, Jan A, MD   34 g at 11/05/19 0935  . potassium chloride SA (KLOR-CON) CR tablet 20 mEq  20 mEq Oral Daily PRN Mansy, Jan A, MD      . rosuvastatin (CRESTOR) tablet 40 mg  40 mg Oral q1800 Shahmehdi, Seyed A, MD   40 mg at 11/04/19 1859  . traZODone (DESYREL) tablet 25 mg  25 mg Oral QHS PRN Mansy, Arvella Merles, MD         Discharge Medications: Please see discharge summary for a list of discharge medications.  Relevant Imaging Results:  Relevant Lab Results:   Additional Information [id number]  Shelbie Ammons, RN

## 2019-11-05 NOTE — Progress Notes (Signed)
PROGRESS NOTE    Patient: [name] [name]                            PCP: [name], [name], MD                    DOB: 13-Jun-1940            DOA: 11/01/2019 EXB:[id number]             DOS: 11/05/2019, 2:03 PM   LOS: 2 days   Date of Service: The patient was seen and examined on 11/05/2019  Subjective:   Patient was seen and examined, remained stable no new focal neurological findings No events overnight.   Brief Narrative:   [name] [name]  is a 79 y.o. African-American male with a known history of lumbar spine stenosis with chronic back pain and urolithiasis, who presented to the emergency room with acute onset of altered mental status with confusion with suspected accidental overdose with opiates.    Per EMS they were called out by his daughter who was worried about the possibility of overdose.He was found    slumped over wheelchair drooling and not speaking.  He had coarse breath sounds and they give him IV Solu-Medrol and nebulizer treatment that did not change his mental status.  Blood glucose was within normal.    ED BP 159/91 with otherwise normal vital signs.  Labs revealed ABG with pH 7.28 with PCO2 of 71 and HCO3 33.4.  BUN was 16 and creatinine 1.29 magnesium was 1.9.  High-sensitivity troponin was 16 and later 15.  CBC was unremarkable Tylenol levels less than 10 and salicylate less than 7 with TSH 2.6.  Respiratory panel came back negative including COVID-19 PCR.   UA showed more than 50 RBCs and more than 50 WBCs.  Urine drug screen came back negative.   Noncontrasted head CT scan revealed stable atrophy and chronic small vessel ischemic disease with remote lacunar infarcts in the basal ganglia with no acute intracranial normalities.  EKG showed sinus rhythm with rate of 64 with probable left atrial enlargement and right bundle branch block as well as ureter Q waves.   Assessment & Plan:   Principal Problem:   CVA (cerebral vascular accident) ([place]) Active  Problems:   Spinal stenosis, lumbar region, with neurogenic claudication   Generalized osteoarthritis of multiple sites   Hypertension, essential, benign   Ambulatory dysfunction   Weakness   AMS (altered mental status)   Acute lower UTI   Acute CVA -Stable no new focal neurological findings -According to the history symptoms started more than a week ago -Which may have contributed to encephalopathy -Labs reviewed within normal limits including  TSH 2.6 A1c 5.7 -LDL 57 -MRI of the brain was reviewed:  IMPRESSION: 1. Acute infarct in the right corona radiata. Additional punctate infarct in the right parietal lobe white matter. No substantial mass effect. 2. Remote lacunar infarcts involving the bilateral thalami, bifrontal white matter, and pons. 3. Extensive chronic microvascular ischemic disease.  MRI reviewed: IMPRESSION: Motion degraded examination. No emergent large vessel occlusion.   Neurology consulted--- recommended aspirin Plavix x3 weeks then continue with Plavix -Patient is outside the window for TPA... Not a candidate -We will continue with statins -PT OT/speech evaluation -2D echocardiogram reviewed within normal limits   Acute metabolic encephalopathy -Multifactorial secondary to UTI, opioid overdose, CVA acute -Improved back to baseline -We will continue to monitor, continue  IV antibiotics IV fluids    Hypertension. -Remained stable    Dyslipidemia. --Increasing statin to high-dose - -Lipid panel reviewed LDL 57 Chronic back pain. -Holding off on narcotics at this time   DVT prophylaxis. -Subtenons Lovenox  Family discussion -none present at bedside  Time for me to discuss with daughter - The plan of care was discussed in details with the patient (and family). .   CODE STATUS: Full code  Status is: Observation  The patient remains OBS appropriate and will d/c before 2 midnights.  Dispo: The patient is from: Home   Anticipated d/c is to: Home w [facility] vs SNF   Social worker consulted, appreciate assistance in disposition  Anticipated d/c date is: 1 day  Patient currently is not medically stable to d/c.  Continue with stroke work-up monitoring      Nutritional status:         Cultures; Urine Culture  >>> 60 K colonies of multiple species Repeat urine culture  Antimicrobials: IV Rocephin        Procedures:   No admission procedures for hospital encounter.    Antimicrobials:  Anti-infectives (From admission, onward)   None       Medication:  .  stroke: mapping our early stages of recovery book   Does not apply Once  . aspirin EC  81 mg Oral Daily  . clopidogrel  75 mg Oral Daily  . DULoxetine  60 mg Oral Daily  . enoxaparin (LOVENOX) injection  40 mg Subcutaneous Q24H  . feeding supplement (ENSURE ENLIVE)  237 mL Oral BID BM  . furosemide  20 mg Oral Daily  . hydrochlorothiazide  12.5 mg Oral Daily  . losartan  50 mg Oral Daily  . nicotine  21 mg Transdermal Daily  . polyethylene glycol  34 g Oral BID  . rosuvastatin  40 mg Oral q1800    acetaminophen **OR** acetaminophen, hydrALAZINE, LORazepam, magnesium hydroxide, oxyCODONE, potassium chloride SA, traZODone   Objective:   Vitals:   11/05/19 0201 11/05/19 0557 11/05/19 0738 11/05/19 1224  BP: (!) 155/80 (!) 146/80 129/78 109/70  Pulse: 67 62 69 72  Resp: 17 16 18 18   Temp: 98.4 F (36.9 C) (!) 97.4 F (36.3 C) 97.9 F (36.6 C) 98.5 F (36.9 C)  TempSrc:  Oral Oral Oral  SpO2: 99% 100% 98% 100%  Weight:      Height:        Intake/Output Summary (Last 24 hours) at 11/05/2019 1403 Last data filed at 11/05/2019 1352 Gross per 24 hour  Intake 480 ml  Output 800 ml  Net -320 ml   Filed Weights   11/01/19 2227  Weight: 59 kg     Examination:    Physical Exam:   General:  Alert, oriented, cooperative, no distress;   HEENT:  Normocephalic, PERRL, otherwise with in Normal  limits   Neuro:  CNII-XII intact. , normal motor and sensation, reflexes intact   Lungs:   Clear to auscultation BL, Respirations unlabored, no wheezes / crackles  Cardio:    S1/S2, RRR, No murmure, No Rubs or Gallops   Abdomen:   Soft, non-tender, bowel sounds active all four quadrants,  no guarding or peritoneal signs.  Muscular skeletal:  Limited exam - in bed, able to move all 4 extremities, Normal strength,  2+ pulses,  symmetric, No pitting edema  Skin:  Dry, warm to touch, negative for any Rashes, No open wounds  Wounds: Please see nursing documentation          ------------------------------------------------------------------------------------------------------------------------------------------  LABs:  CBC Latest Ref Rng & Units 11/01/2019 09/22/2019 09/21/2019  WBC 4.0 - 10.5 K/uL 9.2 9.5 9.5  Hemoglobin 13.0 - 17.0 g/dL 13.6 12.6(L) 13.2  Hematocrit 39 - 52 % 44.2 38.4(L) 40.1  Platelets 150 - 400 K/uL 268 236 220   CMP Latest Ref Rng & Units 11/01/2019 09/22/2019 09/21/2019  Glucose 70 - 99 mg/dL 89 88 90  BUN 8 - 23 mg/dL 16 25(H) 21  Creatinine 0.61 - 1.24 mg/dL 1.29(H) 1.07 1.07  Sodium 135 - 145 mmol/L 138 137 139  Potassium 3.5 - 5.1 mmol/L 4.7 3.9 3.9  Chloride 98 - 111 mmol/L 102 105 108  CO2 22 - 32 mmol/L 28 23 20(L)  Calcium 8.9 - 10.3 mg/dL 9.2 8.5(L) 8.2(L)  Total Protein 6.5 - 8.1 g/dL 7.9 - -  Total Bilirubin 0.3 - 1.2 mg/dL 1.0 - -  Alkaline Phos 38 - 126 U/L 107 - -  AST 15 - 41 U/L 26 - -  ALT 0 - 44 U/L 19 - -       Micro Results Recent Results (from the past 240 hour(s))  Respiratory Panel by RT PCR (Flu A&B, Covid) - Nasopharyngeal Swab     Status: None   Collection Time: 11/01/19 10:57 PM   Specimen: Nasopharyngeal Swab  Result Value Ref Range Status   SARS Coronavirus 2 by RT PCR NEGATIVE NEGATIVE Final    Comment: (NOTE) SARS-CoV-2 target nucleic acids are NOT DETECTED.  The SA   Influenza A by PCR NEGATIVE NEGATIVE Final    Influenza B by PCR NEGATIVE NEGATIVE Final    Comment: (NOTE) The Xpert Xpress SARS-CoV-2/FLU/RSV assay is intended as an aid in  the diagnosis of influenza from Nasopharyngeal swab specimens and  should not be used as a sole basis for treatment. Nasal washings and  aspirates are unacceptable for Xpert Xpress SARS Performed at [facility], [address] [place], [place] [address]     Radiology Reports DG Chest 1 View  Result Date: 11/01/2019 CLINICAL DATA:  Increased weakness and altered mental status. EXAM: CHEST  1 VIEW COMPARISON:  May 03, 2019 FINDINGS: The heart size and mediastinal contours are within normal limits. Both lungs are clear. Multilevel degenerative changes seen within the mid and lower thoracic spine. IMPRESSION: No acute cardiopulmonary disease. Electronically Signed   By: Virgina Norfolk M.D.   On: 11/01/2019 22:41   CT Head Wo Contrast  Result Date: 11/01/2019 CLINICAL DATA:  Mental status change, unknown callus. EXAM: CT HEAD WITHOUT CONTRAST TECHNIQUE: Contiguous axial images were obtained from the base of the skull through the vertex without intravenous contrast. COMPARISON:  Head CT 09/19/2019 FINDINGS: Brain: Stable degree of atrophy and chronic small vessel ischemia. Remote lacunar infarct in the right greater than left basal ganglia. No evidence of acute infarction, hemorrhage, hydrocephalus, extra-axial collection or mass lesion/mass effect. Vascular: Atherosclerosis of skullbase vasculature without hyperdense vessel or abnormal calcification. Skull: No fracture or focal lesion. Sinuses/Orbits: Remote medial left orbital fracture. Bilateral cataract resection. Mastoid air cells are clear. No acute findings. Other: None. IMPRESSION: 1. No acute intracranial abnormality. 2. Stable atrophy and chronic small vessel ischemia. Remote lacunar infarcts in the basal ganglia. Electronically Signed   By: Keith Rake M.D.   On: 11/01/2019 23:29   MR ANGIO  HEAD WO CONTRAST  Result Date: 11/03/2019 CLINICAL DATA:  Stroke follow-up EXAM: MRA HEAD WITHOUT CONTRAST TECHNIQUE: Angiographic images of the Circle of Willis were obtained using MRA technique without  intravenous contrast. COMPARISON:  Brain MRI 11/02/2019 FINDINGS: Examination is degraded by motion. POSTERIOR CIRCULATION: --Vertebral arteries: Normal right dominant configuration of V4 segments. --Inferior cerebellar arteries: Poorly visualized --Basilar artery: Normal. --Superior cerebellar arteries: Normal. --Posterior cerebral arteries: Normal. ANTERIOR CIRCULATION: --Intracranial internal carotid arteries: Normal. --Anterior cerebral arteries (ACA): Normal. Both A1 segments are present. Patent anterior communicating artery (a-comm). --Middle cerebral arteries (MCA): Normal. IMPRESSION: Motion degraded examination. No emergent large vessel occlusion. Electronically Signed   By: Ulyses Jarred M.D.   On: 11/03/2019 20:34   MR BRAIN WO CONTRAST  Result Date: 11/02/2019 CLINICAL DATA:  Stroke suspected. EXAM: MRI HEAD WITHOUT CONTRAST TECHNIQUE: Multiplanar, multiecho pulse sequences of the brain and surrounding structures were obtained without intravenous contrast. COMPARISON:  Same day head CT. FINDINGS: Brain: Acute infarct involving the right corona radiata. Additional punctate infarct in the right parietal lobe white matter. Remote lacunar infarcts involving the bilateral thalami, bifrontal white matter, and pons. No acute hemorrhage. No substantial mass effect. Advanced T2/FLAIR hyperintensity in the periventricular white matter, compatible with chronic microvascular ischemic disease. Moderate diffuse cerebral volume loss with ex vacuo ventricular dilation. No hydrocephalus. Vascular: Proximal major intracranial arterial flow voids are maintained at the skull base. Skull and upper cervical spine: Normal marrow signal. Sinuses/Orbits: Remote left medial orbital wall fracture. Mild scattered paranasal  sinus mucosal thickening without air-fluid levels. Other: No mastoid effusions. IMPRESSION: 1. Acute infarct in the right corona radiata. Additional punctate infarct in the right parietal lobe white matter. No substantial mass effect. 2. Remote lacunar infarcts involving the bilateral thalami, bifrontal white matter, and pons. 3. Extensive chronic microvascular ischemic disease. Electronically Signed   By: Margaretha Sheffield MD   On: 11/02/2019 15:11   US Carotid Bilateral  Result Date: 11/03/2019 CLINICAL DATA:  79 year old male with CVA EXAM: BILATERAL CAROTID DUPLEX ULTRASOUND TECHNIQUE: Pearline Cables scale imaging, color Doppler and duplex ultrasound were performed of bilateral carotid and vertebral arteries in the neck. COMPARISON:  None. FINDINGS: Criteria: Quantification of carotid stenosis is based on velocity parameters that correlate the residual internal carotid diameter with NASCET-based stenosis levels, using the diameter of the distal internal carotid lumen as the denominator for stenosis measurement. The following velocity measurements were obtained: RIGHT ICA:  Systolic 81 cm/sec, Diastolic 19 cm/sec CCA:  59 cm/sec SYSTOLIC ICA/CCA RATIO:  1.4 ECA:  89 cm/sec LEFT ICA:  Systolic 77 cm/sec, Diastolic 14 cm/sec CCA:  75 cm/sec SYSTOLIC ICA/CCA RATIO:  1.0 ECA:  91 cm/sec Right Brachial SBP: Not acquired Left Brachial SBP: Not acquired RIGHT CAROTID ARTERY: No significant calcifications of the right common carotid artery. Intermediate waveform maintained. Heterogeneous and partially calcified plaque at the right carotid bifurcation. No significant lumen shadowing. Low resistance waveform of the right ICA. No significant tortuosity. RIGHT VERTEBRAL ARTERY: Antegrade flow with low resistance waveform. LEFT CAROTID ARTERY: No significant calcifications of the left common carotid artery. Intermediate waveform maintained. Heterogeneous and partially calcified plaque at the left carotid bifurcation without  significant lumen shadowing. Low resistance waveform of the left ICA. No significant tortuosity. LEFT VERTEBRAL ARTERY:  Antegrade flow with low resistance waveform. IMPRESSION: Color duplex indicates minimal heterogeneous and calcified plaque, with no hemodynamically significant stenosis by duplex criteria in the extracranial cerebrovascular circulation. Signed, Dulcy Fanny. Dellia Nims, RPVI [facility] Radiology Electronically Signed   By: Corrie Mckusick D.O.   On: 11/03/2019 10:42   ECHOCARDIOGRAM COMPLETE  Result Date: 11/04/2019    ECHOCARDIOGRAM REPORT   Patient Name:  [name] [name] Date of Exam: 11/03/2019 Medical Rec #:  [id number]               Height:       70.0 in Accession #:    [id number]              Weight:       130.1 lb Date of Birth:  1940-09-30               BSA:          1.739 m Patient Age:    89 years                BP:           133/88 mmHg Patient Gender: M                       HR:           70 bpm. Exam Location:  [facility] Procedure: 2D Echo Indications:     Stroke I163.9  History:         Patient has no prior history of Echocardiogram examinations.  Sonographer:     Arville Go RDCS Referring Phys:  [id number] Valeria Batman Naser Schuld Diagnosing Phys: Buford Dresser MD IMPRESSIONS  1. Left ventricular ejection fraction, by estimation, is 60 to 65%. The left ventricle has normal function. The left ventricle has no regional wall motion abnormalities. Left ventricular diastolic parameters were normal.  2. Right ventricular systolic function is normal. The right ventricular size is normal.  3. The mitral valve is normal in structure. Trivial mitral valve regurgitation. No evidence of mitral stenosis.  4. The aortic valve is grossly normal. Aortic valve regurgitation is not visualized. No aortic stenosis is present.  5. The inferior vena cava is normal in size with greater than 50% respiratory variability, suggesting right atrial pressure of  3 mmHg. Comparison(s): No prior Echocardiogram. Conclusion(s)/Recommendation(s): No intracardiac source of embolism detected on this transthoracic study. A transesophageal echocardiogram is recommended to exclude cardiac source of embolism if clinically indicated. FINDINGS  Left Ventricle: Left ventricular ejection fraction, by estimation, is 60 to 65%. The left ventricle has normal function. The left ventricle has no regional wall motion abnormalities. The left ventricular internal cavity size was normal in size. There is  no left ventricular hypertrophy. Left ventricular diastolic parameters were normal. Right Ventricle: The right ventricular size is normal. No increase in right ventricular wall thickness. Right ventricular systolic function is normal. Left Atrium: Left atrial size was normal in size. Right Atrium: Right atrial size was not well visualized. Pericardium: There is no evidence of pericardial effusion. Mitral Valve: The mitral valve is normal in structure. Trivial mitral valve regurgitation. No evidence of mitral valve stenosis. Tricuspid Valve: The tricuspid valve is normal in structure. Tricuspid valve regurgitation is trivial. No evidence of tricuspid stenosis. Aortic Valve: The aortic valve is grossly normal. Aortic valve regurgitation is not visualized. No aortic stenosis is present. Aortic valve mean gradient measures 3.0 mmHg. Aortic valve peak gradient measures 6.1 mmHg. Aortic valve area, by VTI measures 2.69 cm. Pulmonic Valve: The pulmonic valve was not well visualized. Pulmonic valve regurgitation is not visualized. Aorta: The aortic root and ascending aorta are structurally normal, with no evidence of dilitation. Venous: The inferior vena cava is normal in size with greater than 50% respiratory variability, suggesting right atrial pressure of 3 mmHg. IAS/Shunts: The atrial septum is grossly normal.  LEFT VENTRICLE PLAX 2D  LVIDd:         4.26 cm  Diastology LVIDs:         2.89 cm  LV e'  medial:    7.29 cm/s LV PW:         1.17 cm  LV E/e' medial:  7.1 LV IVS:        0.97 cm  LV e' lateral:   6.53 cm/s LVOT diam:     2.20 cm  LV E/e' lateral: 7.9 LV SV:         56 LV SV Index:   32 LVOT Area:     3.80 cm  RIGHT VENTRICLE RV Basal diam:  3.66 cm RV S prime:     18.80 cm/s TAPSE (M-mode): 2.8 cm LEFT ATRIUM             Index       RIGHT ATRIUM           Index LA diam:        2.80 cm 1.61 cm/m  RA Area:     16.90 cm LA Vol (A2C):   12.7 ml 7.31 ml/m  RA Volume:   50.30 ml  28.93 ml/m LA Vol (A4C):   24.7 ml 14.21 ml/m LA Biplane Vol: 18.5 ml 10.64 ml/m  AORTIC VALVE                   PULMONIC VALVE AV Area (Vmax):    2.52 cm    PV Vmax:       0.88 m/s AV Area (Vmean):   2.47 cm    PV Peak grad:  3.1 mmHg AV Area (VTI):     2.69 cm AV Vmax:           123.00 cm/s AV Vmean:          76.400 cm/s AV VTI:            0.209 m AV Peak Grad:      6.1 mmHg AV Mean Grad:      3.0 mmHg LVOT Vmax:         81.60 cm/s LVOT Vmean:        49.700 cm/s LVOT VTI:          0.148 m LVOT/AV VTI ratio: 0.71  AORTA Ao Root diam: 3.40 cm Ao Asc diam:  3.20 cm MITRAL VALVE               TRICUSPID VALVE MV Area (PHT): 2.73 cm    TV Peak grad:   32.9 mmHg MV Decel Time: 278 msec    TV Vmax:        2.87 m/s MV E velocity: 51.40 cm/s MV A velocity: 84.40 cm/s  SHUNTS MV E/A ratio:  0.61        Systemic VTI:  0.15 m                            Systemic Diam: 2.20 cm Buford Dresser MD Electronically signed by Buford Dresser MD Signature Date/Time: 11/04/2019/7:49:42 AM    Final     SIGNED: Deatra James, MD, FACP, FHM. [facility],  Pager (please use [email or web address] to page/text)  If 7PM-7AM, please contact night-coverage [email or web address], Password [facility] 11/05/2019, 2:03 PM

## 2019-11-05 NOTE — Progress Notes (Signed)
PT Cancellation Note  Patient Details Name: [name] [name] MRN: [id number] DOB: February 13, 1940   Cancelled Treatment:     PT attempt. Pt was sitting in recliner and does agree to PT session however upon standing, pt is soiled with BM and cath has fallen off. Pt does have standing balance deficits requiring +2 assist for safety to clean up BM. Unable to get +2 assist at this time. RN/RN tech both notified and unable to assist at this time. Acute PT will continue to follow per POC.    Willette Pa 11/05/2019, 3:53 PM

## 2019-11-05 NOTE — Progress Notes (Signed)
Occupational Therapy Treatment Patient Details Name: [name] [name] MRN: [id number] DOB: 06-16-1940 Today's Date: 11/05/2019    History of present illness Pt. is a 79 y.o. male with PMHx: LSS, HTN, who comes to [facility] on 11/02/19 after several falls. In ED, MRI shows acute Rt sided corona radiata infarct. Pt also being evaluated for potential accidental opiate overdose and UTI. At baseline pt lives alone, uses RW or [id number], falls frquently.   OT comments  Pt. reports back pain upon arrival while sidelying in bed. Pt. was assisted with repositioning for comfort, and assisted with tranfers from the bed to the recliner. Pt. Requires increased assist for ADL tasks secondary to back pain. Pt. Could benefit from OT services for ADL training, A/E training, and pt./caregiver education about home modification, and DME. Pt. continues to be appropriate for SNF level of care upon discharge.   Follow Up Recommendations  SNF    Equipment Recommendations  3 in 1 bedside commode    Recommendations for Other Services      Precautions / Restrictions Precautions Precautions: Fall Restrictions Weight Bearing Restrictions: No       Mobility Bed Mobility Overal bed mobility: Needs Assistance Bed Mobility: Supine to Sit;Sit to Supine     Supine to sit: Min assist        Transfers Overall transfer level: Needs assistance Equipment used: Rolling walker (2 wheeled) Transfers: Sit to/from Stand Sit to Stand: Min assist              Balance                                           ADL either performed or assessed with clinical judgement   ADL Overall ADL's : Needs assistance/impaired                                       General ADL Comments: Pt. requires ModA LE ADLs secondary to back pain. MinA transfers bed to recliner chair     Vision Baseline Vision/History: Wears glasses Wears Glasses: At all times     Perception     Praxis       Cognition   Behavior During Therapy: Flat affect Overall Cognitive Status: No family/caregiver present to determine baseline cognitive functioning Area of Impairment: Problem solving;Awareness;Safety/judgement                         Safety/Judgement: Decreased awareness of safety;Decreased awareness of deficits   Problem Solving: Slow processing;Decreased initiation          Exercises     Shoulder Instructions       General Comments      Pertinent Vitals/ Pain       Pain Assessment: Faces Pain Score: 6  Faces Pain Scale: Hurts even more Pain Location: Back Pain Descriptors / Indicators: Aching Pain Intervention(s): Limited activity within patient's tolerance;Repositioned  Home Living                                          Prior Functioning/Environment              Frequency  Min 2X/week  Progress Toward Goals  OT Goals(current goals can now be found in the care plan section)  Progress towards OT goals: OT to reassess next treatment  Acute Rehab OT Goals Patient Stated Goal: To get better OT Goal Formulation: With patient Time For Goal Achievement: 11/18/19 Potential to Achieve Goals: [place] Discharge plan remains appropriate    Co-evaluation                 AM-PAC OT "6 Clicks" Daily Activity     Outcome Measure   Help from another person eating meals?: A Little Help from another person taking care of personal grooming?: A Little Help from another person toileting, which includes using toliet, bedpan, or urinal?: A Little Help from another person bathing (including washing, rinsing, drying)?: A Little Help from another person to put on and taking off regular upper body clothing?: A Little Help from another person to put on and taking off regular lower body clothing?: A Lot 6 Click Score: 17    End of Session Equipment Utilized During Treatment: Gait belt  OT Visit Diagnosis: Other  abnormalities of gait and mobility (R26.89)   Activity Tolerance Patient tolerated treatment well   Patient Left with chair alarm set;with nursing/sitter in room;with call bell/phone within reach   Nurse Communication          Time: 1427-6701 OT Time Calculation (min): 23 min  Charges: OT Treatments $Self Care/Home Management : 23-37 mins  Harrel Carina, MS, OTR/L    Harrel Carina 11/05/2019, 9:55 AM

## 2019-11-05 NOTE — Progress Notes (Signed)
Initial Nutrition Assessment  DOCUMENTATION CODES:   Severe malnutrition in context of social or environmental circumstances  INTERVENTION:  Increase to Ensure Enlive po TID, each supplement provides 350 kcal and 20 grams of protein.  Monitor magnesium, potassium, and phosphorus daily for at least 3 days, MD to replete as needed, as pt is at risk for refeeding syndrome given severe malnutrition.  NUTRITION DIAGNOSIS:   Severe Malnutrition related to social / environmental circumstances (difficulty obtaining and preparing food, suspected inadequate oral intake) as evidenced by severe fat depletion, severe muscle depletion.  GOAL:   Patient will meet greater than or equal to 90% of their needs  MONITOR:   PO intake, Supplement acceptance, Labs, Weight trends, I & O's  REASON FOR ASSESSMENT:   Consult Assessment of nutrition requirement/status  ASSESSMENT:   79 year old male with PMHx of lumbar spine stenosis with chronic back pain and urolithiasis admitted with acute CVA (right corona radiata stroke), acute metabolic encephalopathy.   10/4 diet changed to dysphagia 2 with thin liquids following SLP evaluation  Met with patient and his sister at bedside. Patient reports his appetite is very good and he is eating well at meals. He reports he is eating 100% of his meals. Per chart patient documented to have eaten 100% of his dinner last night. He is also drinking Ensure Enlive here at the hospital and Ensure Plus brought in by patient's sister. Patient lives at home with his son. Patient reports son does not help patient with grocery shopping or meal preparation. Patient has to manage with trying to grocery shop himself and buying ready-to-eat foods that he can eat at home as he reports he cannot cook. He does have a friend that helps or cooks for him occasionally but she has been busy lately at work. He is unable to describe details of what he usually eats at home. Patient is amenable  to continue drinking Ensure between meals to help meet calorie/protein needs.  Patient reports his UBW was 165-170 lbs and that he first began losing weight 3-4 years ago. Per chart patient was 66 kg on 12/04/2018. He is now 59 kg (130.07 lbs). He has lost 7 kg (10.6% body weight) over one year, which is not significant for time frame.   Medications reviewed and include: Lasix 20 mg daily, Miralax.  Labs reviewed: Creatinine 1.29.  Discussed with RN. Patient eating well here and drinking Ensure.  NUTRITION - FOCUSED PHYSICAL EXAM:    Most Recent Value  Orbital Region Severe depletion  Upper Arm Region Severe depletion  Thoracic and Lumbar Region Severe depletion  Buccal Region Severe depletion  Temple Region Severe depletion  Clavicle Bone Region Severe depletion  Clavicle and Acromion Bone Region Severe depletion  Scapular Bone Region Severe depletion  Dorsal Hand Severe depletion  Patellar Region Severe depletion  Anterior Thigh Region Severe depletion  Posterior Calf Region Severe depletion  Edema (RD Assessment) None  Hair Reviewed  Eyes Reviewed  Mouth Reviewed  Skin Reviewed  Nails Reviewed     Diet Order:   Diet Order            DIET DYS 2 Room service appropriate? Yes with Assist; Fluid consistency: Thin  Diet effective now                EDUCATION NEEDS:   No education needs have been identified at this time  Skin:  Skin Assessment: Reviewed RN Assessment  Last BM:  11/05/2019 per chart  Height:  Ht Readings from Last 1 Encounters:  11/01/19 5' 10"  (1.778 m)   Weight:   Wt Readings from Last 1 Encounters:  11/01/19 59 kg   Ideal Body Weight:  75.5 kg  BMI:  Body mass index is 18.66 kg/m.  Estimated Nutritional Needs:   Kcal:  1700-1900  Protein:  85-95 grams  Fluid:  1.5 L/day  Jacklynn Barnacle, MS, RD, LDN Pager number available on Amion

## 2019-11-05 NOTE — Progress Notes (Signed)
PT Cancellation Note  Patient Details Name: [name] [name] MRN: [id number] DOB: Jan 08, 1941   Cancelled Treatment:     PT attempt. Pt currently eating lunch (1425). Will return when pt is available to participate in PT. Family member requesting assistance with pt shaving. Will inform RN tech. Acute PT will continue to follow per POC.    Willette Pa 11/05/2019, 2:25 PM

## 2019-11-06 NOTE — Progress Notes (Signed)
Physical Therapy Treatment Patient Details Name: [name] [name] MRN: [id number] DOB: Feb 07, 1940 Today's Date: 11/06/2019    History of Present Illness Pt. is a 79 y.o. male with PMHx: LSS, HTN, who comes to [facility] on 11/02/19 after several falls. In ED, MRI shows acute Rt sided corona radiata infarct. Pt also being evaluated for potential accidental opiate overdose and UTI. At baseline pt lives alone, uses RW or [id number], falls frquently.    PT Comments    Pt was asleep in supine upon arriving. He easily awakes and is cooperative throughout. Pt does have flat affect and requires increased time to process desired task. He was able to exit L side of bed with min assist + a lot of increased time. Sat EOB with supervision prior to standing to RW and ambulating 80 ft with slow, flexed, narrow gait kinematics. Constant assistance with RW advancement and vcs for posture correction. HR was stable throughout session. Pt demonstrated poor eccentric controlled lowering from standing to sitting. Overall pt tolerated session well but will benefit from rehab at DC. He will continue to require skilled PT to address deficits with strength, balance, and overall safe functional mobility. Acute PT recommends SNF at DC to address these deficits and improve independence. At conclusion of session, pt was sitting in recliner with chair alarm in place, BLEs elevated, call bell in reach, and breakfast tray set up in front on him. Acute PT will continue to follow per POC.    Follow Up Recommendations  SNF     Equipment Recommendations  None recommended by PT    Recommendations for Other Services       Precautions / Restrictions Precautions Precautions: Fall Restrictions Weight Bearing Restrictions: No    Mobility  Bed Mobility Overal bed mobility: Needs Assistance Bed Mobility: Supine to Sit     Supine to sit: Min assist     General bed mobility comments: Increased time to perform with increased  processing time required. Min assist to safely exit L side of bed  Transfers Overall transfer level: Needs assistance Equipment used: Rolling walker (2 wheeled) Transfers: Sit to/from Stand Sit to Stand: Min guard;Min assist         General transfer comment: CGA to stand from slightly elevated bed height. Required Min assist for eccentric controlled lowering to sitting in recliner.  Ambulation/Gait Ambulation/Gait assistance: Min assist Gait Distance (Feet): 80 Feet Assistive device: Rolling walker (2 wheeled) Gait Pattern/deviations: Narrow base of support;Trunk flexed;Shuffle;Step-through pattern Gait velocity: decreased   General Gait Details: Pt has very slow, flexed, narrow gait pattern.Constant min assist for advancement of RW and VCs for posture correction throughout.       Balance Overall balance assessment: Needs assistance Sitting-balance support: Feet supported Sitting balance-Leahy Scale: Fair Sitting balance - Comments: no LOB in sitting at EOB   Standing balance support: Bilateral upper extremity supported Standing balance-Leahy Scale: Poor Standing balance comment: does require BUE + min assist throughout dynamic activity to prevent fall       Cognition Arousal/Alertness: Awake/alert Behavior During Therapy: Flat affect Overall Cognitive Status: No family/caregiver present to determine baseline cognitive functioning Area of Impairment: Problem solving;Awareness;Safety/judgement      Safety/Judgement: Decreased awareness of safety;Decreased awareness of deficits Awareness: Intellectual Problem Solving: Slow processing;Decreased initiation General Comments: Pt alert and oriented x3, increased processing time/problem solving/initiation, mild confusion noted             Pertinent Vitals/Pain Pain Assessment: No/denies pain Pain Score: 0-No pain Faces Pain Scale:  No hurt Pain Intervention(s): Limited activity within patient's tolerance;Monitored  during session;Repositioned           PT Goals (current goals can now be found in the care plan section) Acute Rehab PT Goals Patient Stated Goal: none stated Progress towards PT goals: Progressing toward goals    Frequency    Min 2X/week      PT Plan Current plan remains appropriate       AM-PAC PT "6 Clicks" Mobility   Outcome Measure  Help needed turning from your back to your side while in a flat bed without using bedrails?: A Little Help needed moving from lying on your back to sitting on the side of a flat bed without using bedrails?: A Little Help needed moving to and from a bed to a chair (including a wheelchair)?: A Lot Help needed standing up from a chair using your arms (e.g., wheelchair or bedside chair)?: A Lot Help needed to walk in hospital room?: A Lot Help needed climbing 3-5 steps with a railing? : A Lot 6 Click Score: 14    End of Session Equipment Utilized During Treatment: Gait belt Activity Tolerance: Patient tolerated treatment well Patient left: in chair;with call bell/phone within reach;with chair alarm set Nurse Communication: Mobility status PT Visit Diagnosis: Difficulty in walking, not elsewhere classified (R26.2);Other abnormalities of gait and mobility (R26.89);Unsteadiness on feet (R26.81)     Time: 5003-7048 PT Time Calculation (min) (ACUTE ONLY): 24 min  Charges:  $Gait Training: 8-22 mins $Therapeutic Activity: 8-22 mins                     Julaine Fusi PTA 11/06/19, 9:12 AM

## 2019-11-06 NOTE — Progress Notes (Signed)
Patient vomited after dinner.

## 2019-11-06 NOTE — Progress Notes (Cosign Needed Addendum)
Speech Language Pathology Treatment: Dysphagia  Patient Details Name: [name] [name] MRN: [id number] DOB: [birth year] Today's Date: 11/06/2019 Time: 1330-1430 SLP Time Calculation (min) (ACUTE ONLY): 60 min  Assessment / Plan / Recommendation Clinical Impression  Pt was seen for f/u toleration of Dysphagia 2 diet w/ thin liquids and education re: diet consistency and aspiration precautions at bedside-- pt assessed directly w/ lunch tray. Sister was present & engaged throughout session. Pt continues to present w/ fatigue and slower, deliberate engagement w/ weakness in overall strength/movements. He needed Mod+ assistance in positioning more upright in chair at bedside.  Per MRI-- ischemic infarct right corona radiata, punctate infarct right parietal lobe, remote lacnuar infarcts involving the bilater thalami, bifrontal white matter, pons; Extensive chronic microvascular ischemic disease. Per head CT-- atrophy, remote lacunar infarcts in the basal ganglia, no acute abnormalities. Unsure of pt's cognitive baseline. Pt drowsy/asleep upon arrival-- generally minimally verbal throughout session; Sister answered many questions re pt's status & care. Pt was responsive to basic questions, however required min increased time for response. Pt is missing Many Dentition.   Pt was assessed directly with PO trials of minced solid consistency via spoon (10x), thin liquid via straw (10x), & puree vis spoon (8x). Pt required assistance for upright positioning in chair; pt able to self-feed w/ set up assist. No overt clinical s/s of aspiration were noted. Pt required min cues to engage safe swallowing strategy of alternating liquids & solids-- especially recommend alternating moistened puree (such as apple sauce) and solids for more complete clearance of bolus residue from oral cavity d/t pt's overall deconditioning & cognitive status. Pt & sister were educated on option of drink supplements added into diet plan  as pt appears to show increased oral intake of liquids vs. w/ solids. Sister indicated pt typically requests straws for intake of liquids. Sister & pt were given education on general aspiration precautions & safe swallowing strategies; handouts provided.   Recommend continuation of current diet-- Dysphagia 2 (minced solids) w/ thin liquids: this consistency of diet is most beneficial moving forward d/t pt's lacking dentition, overall deconditioning and slower engagement. Dysphagia 2 diet allows for conservation of energy and consequent increased oral intake hopefully. Recommend following general aspiration precautions & safe swallowing strategies; alternate solids & liquid consistencies (Include applesauce or other moist puree at mealtime to alternate with solid foods). Recommend f/u w/ Dietician for drink supplements. No further ST services recommended at this time; NSG to reconsult if new needs arise. NSG & MD updated; education provided.     HPI HPI: 79yo male admitted 11/01/19 with AMS/confusion and possible overdose. PMH: lumbar spine stenosis, chronic back pain, kidney stones, urolithiasis. CTHead = atrophy, remote lacunar infarcts in the basal ganglia, no acute abnormalities. MRI = ischemic infarct right corona radiata, punctate infarct right parietal lobe, remote lacnuar infarcts involving the bilater thalami, bifrontal white matter, pons, extensive chronic microvascular ischemic disease      SLP Plan  All goals met; education provided to pt/Sister present. Handout given       Recommendations  Diet recommendations: Dysphagia 2 (fine chop);Thin liquid Liquids provided via: Straw Medication Administration: Whole meds with puree Supervision: Staff to assist with self feeding;Intermittent supervision to cue for compensatory strategies Compensations: Minimize environmental distractions;Slow rate;Small sips/bites;Follow solids with liquid Postural Changes and/or Swallow Maneuvers: Seated upright  90 degrees                Oral Care Recommendations: Oral care BID Follow up Recommendations: Skilled Nursing facility  if needed;24 hour supervision/assistance SLP Visit Diagnosis: Dysphagia, unspecified (R13.10) Plan: All goals met       GO                Quintella Baton  Graduate Clinician 11/06/2019, 1:38 PM   The information in this patient note, response to treatment, and overall treatment plan developed has been reviewed and agreed upon by this clinician.  Orinda Kenner, MS, [place] Speech Language Pathologist Rehab Services [phone]

## 2019-11-06 NOTE — Care Management Important Message (Signed)
Important Message  Patient Details  Name: [name] [name] MRN: [id number] Date of Birth: 20-Oct-1940   Medicare Important Message Given:  Yes  Initial Medicare IM given by Meredith Mody, Patient Access Associate on 11/05/2019 at 12:42pm.   Dannette Barbara 11/06/2019, 8:23 AM

## 2019-11-06 NOTE — Progress Notes (Signed)
PROGRESS NOTE    [name] [name]  ZCH:[id number] DOB: 18-Nov-1940 DOA: 11/01/2019 PCP: [name], [name], MD   Chief Complaint  Patient presents with  . Drug Overdose    possible medication overdose per EMS      Brief Narrative:  79 year old gentleman with prior history of lumbar spinal stenosis, chronic back pain urolithiasis presents to ED with acute onset of altered mental status and possible/suspected accidental overdose with opiates.  Initial CT of the head without contrast shows stable atrophy and chronic small vessel ischemic disease with remote lacunar infarcts in the basal ganglia.  MRI of the brain shows acute infarct in the right corona radiator with additional punctate infarcts in the right parietal lobe white matter with remote lacunar infarcts involving the bilateral thalami, from bifrontal white matter and pons.  He was admitted for evaluation of acute CVA  Assessment & Plan:   Principal Problem:   CVA (cerebral vascular accident) [facility]) Active Problems:   Spinal stenosis, lumbar region, with neurogenic claudication   Generalized osteoarthritis of multiple sites   Hypertension, essential, benign   Ambulatory dysfunction   Weakness   AMS (altered mental status)   Acute lower UTI   Acute infarct in the right corona radiata: No new focal deficits seen Neurology consulted recommended aspirin and Plavix for 3 weeks followed by Plavix alone He was not a candidate for TPA as he came in outside the window PT OT evaluation recommending SNF SLP evaluation recommending dysphagia 2 diet Echocardiogram does not show any thrombus. A1c is 5.7, LDL is 57    Acute metabolic encephalopathy probably secondary to combination of  CVA, opioid overdose Patient appears to be at baseline at this time.  Hyperlipidemia Continue the statin.   Chronic back pain Continue with Tylenol.   Essential hypertension blood pressure parameters are well controlled.   Currently waiting  for a bed at SNF patient is medically stable for discharge   DVT prophylaxis: (Lovenox) Code Status: (Full code) Family Communication: None at bedside  disposition:   Status is: Inpatient  Remains inpatient appropriate because:Unsafe d/c plan   Dispo: The patient is from: Home              Anticipated d/c is to: SNF              Anticipated d/c date is: 1 day              Patient currently is medically stable to d/c.       Consultants:   Neurology  Procedures: None Antimicrobials: None  Subjective: No new complaints  Objective: Vitals:   11/06/19 0050 11/06/19 0505 11/06/19 0755 11/06/19 1304  BP: 117/63 110/83 125/71 105/70  Pulse: 78 74 65 84  Resp: 18 16 18 18   Temp: 98.3 F (36.8 C) 98 F (36.7 C) 98.6 F (37 C) 98 F (36.7 C)  TempSrc: Oral Oral  Oral  SpO2: 99% 99% 100% 100%  Weight:      Height:        Intake/Output Summary (Last 24 hours) at 11/06/2019 1417 Last data filed at 11/06/2019 0940 Gross per 24 hour  Intake 240 ml  Output --  Net 240 ml   Filed Weights   11/01/19 2227  Weight: 59 kg    Examination:  General exam: Appears calm and comfortable  Respiratory system: Clear to auscultation. Respiratory effort normal. Cardiovascular system: S1 & S2 heard, RRR. No JVD,No pedal edema. Gastrointestinal system: Abdomen is nondistended, soft and nontender.Normal  bowel sounds heard. Central nervous system: Alert and oriented. No focal neurological deficits. Extremities: Symmetric 5 x 5 power. Skin: No rashes, lesions or ulcers Psychiatry: Judgement and insight appear normal. Mood & affect appropriate.     Data Reviewed: I have personally reviewed following labs and imaging studies  CBC: Recent Labs  Lab 11/01/19 2257  WBC 9.2  NEUTROABS 6.2  HGB 13.6  HCT 44.2  MCV 85.5  PLT 759    Basic Metabolic Panel: Recent Labs  Lab 11/01/19 2257  NA 138  K 4.7  CL 102  CO2 28  GLUCOSE 89  BUN 16  CREATININE 1.29*  CALCIUM 9.2   MG 1.9    GFR: Estimated Creatinine Clearance: 39.4 mL/min (A) (by C-G formula based on SCr of 1.29 mg/dL (H)).  Liver Function Tests: Recent Labs  Lab 11/01/19 2257  AST 26  ALT 19  ALKPHOS 107  BILITOT 1.0  PROT 7.9  ALBUMIN 4.2    CBG: No results for input(s): GLUCAP in the last 168 hours.   Recent Results (from the past 240 hour(s))  Respiratory Panel by RT PCR (Flu A&B, Covid) - Nasopharyngeal Swab     Status: None   Collection Time: 11/01/19 10:57 PM   Specimen: Nasopharyngeal Swab  Result Value Ref Range Status   SARS Coronavirus 2 by RT PCR NEGATIVE NEGATIVE Final    Comment: (NOTE) SARS-CoV-2 target nucleic acids are NOT DETECTED.  The SARS-CoV-2 RNA is generally detectable in upper respiratoy specimens during the acute phase of infection. The lowest concentration of SARS-CoV-2 viral copies this assay can detect is 131 copies/mL. A negative result does not preclude SARS-Cov-2 infection and should not be used as the sole basis for treatment or other patient management decisions. A negative result may occur with  improper specimen collection/handling, submission of specimen other than nasopharyngeal swab, presence of viral mutation(s) within the areas targeted by this assay, and inadequate number of viral copies (<131 copies/mL). A negative result must be combined with clinical observations, patient history, and epidemiological information. The expected result is Negative.  Fact Sheet for Patients:  PinkCheek.be  Fact Sheet for Healthcare Providers:  GravelBags.it  This test is no t yet approved or cleared by the [place] FDA and  has been authorized for detection and/or diagnosis of SARS-CoV-2 by FDA under an Emergency Use Authorization (EUA). This EUA will remain  in effect (meaning this test can be used) for the duration of the COVID-19 declaration under Section 564(b)(1) of the Act, 21  U.S.C. section 360bbb-3(b)(1), unless the authorization is terminated or revoked sooner.     Influenza A by PCR NEGATIVE NEGATIVE Final   Influenza B by PCR NEGATIVE NEGATIVE Final    Comment: (NOTE) The Xpert Xpress SARS-CoV-2/FLU/RSV assay is intended as an aid in  the diagnosis of influenza from Nasopharyngeal swab specimens and  should not be used as a sole basis for treatment. Nasal washings and  aspirates are unacceptable for Xpert Xpress SARS-CoV-2/FLU/RSV  testing.  Fact Sheet for Patients: PinkCheek.be  Fact Sheet for Healthcare Providers: GravelBags.it  This test is not yet approved or cleared by the [place] FDA and  has been authorized for detection and/or diagnosis of SARS-CoV-2 by  FDA under an Emergency Use Authorization (EUA). This EUA will remain  in effect (meaning this test can be used) for the duration of the  Covid-19 declaration under Section 564(b)(1) of the Act, 21  U.S.C. section 360bbb-3(b)(1), unless the authorization is  terminated or  revoked. Performed at [facility], [address], [place], [place] [address]   Urine Culture     Status: Abnormal   Collection Time: 11/02/19  2:16 AM   Specimen: Urine, Random  Result Value Ref Range Status   Specimen Description   Final    URINE, RANDOM Performed at [facility], [address] [place], [place] [address]    Special Requests   Final    NONE Performed at [facility], [address] [place], [place] [address]    Culture (A)  Final    60,000 COLONIES/mL MULTIPLE SPECIES PRESENT, SUGGEST RECOLLECTION   Report Status 11/03/2019 FINAL  Final  Resp Panel by RT PCR (RSV, Flu A&B, Covid) - Nasopharyngeal Swab     Status: None   Collection Time: 11/05/19  5:52 PM   Specimen: Nasopharyngeal Swab  Result Value Ref Range Status   SARS Coronavirus 2 by RT PCR NEGATIVE NEGATIVE Final    Comment:  (NOTE) SARS-CoV-2 target nucleic acids are NOT DETECTED.  The SARS-CoV-2 RNA is generally detectable in upper respiratoy specimens during the acute phase of infection. The lowest concentration of SARS-CoV-2 viral copies this assay can detect is 131 copies/mL. A negative result does not preclude SARS-Cov-2 infection and should not be used as the sole basis for treatment or other patient management decisions. A negative result may occur with  improper specimen collection/handling, submission of specimen other than nasopharyngeal swab, presence of viral mutation(s) within the areas targeted by this assay, and inadequate number of viral copies (<131 copies/mL). A negative result must be combined with clinical observations, patient history, and epidemiological information. The expected result is Negative.  Fact Sheet for Patients:  PinkCheek.be  Fact Sheet for Healthcare Providers:  GravelBags.it  This test is no t yet approved or cleared by the [place] FDA and  has been authorized for detection and/or diagnosis of SARS-CoV-2 by FDA under an Emergency Use Authorization (EUA). This EUA will remain  in effect (meaning this test can be used) for the duration of the COVID-19 declaration under Section 564(b)(1) of the Act, 21 U.S.C. section 360bbb-3(b)(1), unless the authorization is terminated or revoked sooner.     Influenza A by PCR NEGATIVE NEGATIVE Final   Influenza B by PCR NEGATIVE NEGATIVE Final    Comment: (NOTE) The Xpert Xpress SARS-CoV-2/FLU/RSV assay is intended as an aid in  the diagnosis of influenza from Nasopharyngeal swab specimens and  should not be used as a sole basis for treatment. Nasal washings and  aspirates are unacceptable for Xpert Xpress SARS-CoV-2/FLU/RSV  testing.  Fact Sheet for Patients: PinkCheek.be  Fact Sheet for Healthcare  Providers: GravelBags.it  This test is not yet approved or cleared by the [place] FDA and  has been authorized for detection and/or diagnosis of SARS-CoV-2 by  FDA under an Emergency Use Authorization (EUA). This EUA will remain  in effect (meaning this test can be used) for the duration of the  Covid-19 declaration under Section 564(b)(1) of the Act, 21  U.S.C. section 360bbb-3(b)(1), unless the authorization is  terminated or revoked.    Respiratory Syncytial Virus by PCR NEGATIVE NEGATIVE Final    Comment: (NOTE) Fact Sheet for Patients: PinkCheek.be  Fact Sheet for Healthcare Providers: GravelBags.it  This test is not yet approved or cleared by the [place] FDA and  has been authorized for detection and/or diagnosis of SARS-CoV-2 by  FDA under an Emergency Use Authorization (EUA). This EUA will remain  in  effect (meaning this test can be used) for the duration of the  COVID-19 declaration under Section 564(b)(1) of the Act, 21 U.S.C.  section 360bbb-3(b)(1), unless the authorization is terminated or  revoked. Performed at [facility], [address], [place], [place] [address]          Radiology Studies: No results found.      Scheduled Meds: .  stroke: mapping our early stages of recovery book   Does not apply Once  . aspirin EC  81 mg Oral Daily  . clopidogrel  75 mg Oral Daily  . DULoxetine  60 mg Oral Daily  . enoxaparin (LOVENOX) injection  40 mg Subcutaneous Q24H  . feeding supplement (ENSURE ENLIVE)  237 mL Oral TID BM  . furosemide  20 mg Oral Daily  . hydrochlorothiazide  12.5 mg Oral Daily  . losartan  50 mg Oral Daily  . nicotine  21 mg Transdermal Daily  . polyethylene glycol  34 g Oral BID  . rosuvastatin  40 mg Oral q1800   Continuous Infusions:   LOS: 3 days        [name], MD [facility]   To contact the  attending provider between 7A-7P or the covering provider during after hours 7P-7A, please log into the web site [email or web address] and access using universal [place] password for that web site. If you do not have the password, please call the hospital operator.  11/06/2019, 2:17 PM

## 2019-11-06 NOTE — TOC Progression Note (Signed)
Transition of Care [facility]) - Progression Note    Patient Details  Name: [name] [name] MRN: [id number] Date of Birth: 1940/03/14  Transition of Care [facility]) CM/SW [place], RN Phone Number: 11/06/2019, 3:05 PM  Clinical Narrative:   RNCM met with patient at bedside, to review bed offer of [facility]. Patient reports that he would like to have other options to choose from. RNCM reached out to other facilities to request that they look at referral.     Expected Discharge Plan: [place] Barriers to Discharge: No Barriers Identified  Expected Discharge Plan and Services Expected Discharge Plan: [place] Choice: [place] arrangements for the past 2 months: Single Family Home                                       Social Determinants of Health (SDOH) Interventions    Readmission Risk Interventions No flowsheet data found.

## 2019-11-07 DIAGNOSIS — R278 Other lack of coordination: Secondary | ICD-10-CM | POA: Diagnosis not present

## 2019-11-07 DIAGNOSIS — N179 Acute kidney failure, unspecified: Secondary | ICD-10-CM | POA: Diagnosis not present

## 2019-11-07 DIAGNOSIS — F0390 Unspecified dementia without behavioral disturbance: Secondary | ICD-10-CM | POA: Diagnosis not present

## 2019-11-07 DIAGNOSIS — E785 Hyperlipidemia, unspecified: Secondary | ICD-10-CM | POA: Diagnosis not present

## 2019-11-07 DIAGNOSIS — I6330 Cerebral infarction due to thrombosis of unspecified cerebral artery: Secondary | ICD-10-CM | POA: Diagnosis not present

## 2019-11-07 DIAGNOSIS — F17210 Nicotine dependence, cigarettes, uncomplicated: Secondary | ICD-10-CM | POA: Diagnosis not present

## 2019-11-07 DIAGNOSIS — M48062 Spinal stenosis, lumbar region with neurogenic claudication: Secondary | ICD-10-CM | POA: Diagnosis not present

## 2019-11-07 DIAGNOSIS — Z1159 Encounter for screening for other viral diseases: Secondary | ICD-10-CM | POA: Diagnosis not present

## 2019-11-07 DIAGNOSIS — I63411 Cerebral infarction due to embolism of right middle cerebral artery: Secondary | ICD-10-CM | POA: Diagnosis not present

## 2019-11-07 DIAGNOSIS — I110 Hypertensive heart disease with heart failure: Secondary | ICD-10-CM | POA: Diagnosis not present

## 2019-11-07 DIAGNOSIS — R451 Restlessness and agitation: Secondary | ICD-10-CM | POA: Diagnosis not present

## 2019-11-07 DIAGNOSIS — I639 Cerebral infarction, unspecified: Secondary | ICD-10-CM | POA: Diagnosis not present

## 2019-11-07 DIAGNOSIS — R279 Unspecified lack of coordination: Secondary | ICD-10-CM | POA: Diagnosis not present

## 2019-11-07 DIAGNOSIS — M159 Polyosteoarthritis, unspecified: Secondary | ICD-10-CM | POA: Diagnosis not present

## 2019-11-07 DIAGNOSIS — Z23 Encounter for immunization: Secondary | ICD-10-CM | POA: Diagnosis not present

## 2019-11-07 DIAGNOSIS — M6281 Muscle weakness (generalized): Secondary | ICD-10-CM | POA: Diagnosis not present

## 2019-11-07 DIAGNOSIS — R1310 Dysphagia, unspecified: Secondary | ICD-10-CM | POA: Diagnosis not present

## 2019-11-07 DIAGNOSIS — I6350 Cerebral infarction due to unspecified occlusion or stenosis of unspecified cerebral artery: Secondary | ICD-10-CM | POA: Diagnosis not present

## 2019-11-07 DIAGNOSIS — R4182 Altered mental status, unspecified: Secondary | ICD-10-CM | POA: Diagnosis not present

## 2019-11-07 DIAGNOSIS — R262 Difficulty in walking, not elsewhere classified: Secondary | ICD-10-CM | POA: Diagnosis not present

## 2019-11-07 DIAGNOSIS — Z03818 Encounter for observation for suspected exposure to other biological agents ruled out: Secondary | ICD-10-CM | POA: Diagnosis not present

## 2019-11-07 DIAGNOSIS — I10 Essential (primary) hypertension: Secondary | ICD-10-CM | POA: Diagnosis not present

## 2019-11-07 DIAGNOSIS — H04129 Dry eye syndrome of unspecified lacrimal gland: Secondary | ICD-10-CM | POA: Diagnosis not present

## 2019-11-07 DIAGNOSIS — R5381 Other malaise: Secondary | ICD-10-CM | POA: Diagnosis not present

## 2019-11-07 LAB — BASIC METABOLIC PANEL
Anion gap: 8 (ref 5–15)
BUN: 37 mg/dL — ABNORMAL HIGH (ref 8–23)
CO2: 29 mmol/L (ref 22–32)
Calcium: 8.8 mg/dL — ABNORMAL LOW (ref 8.9–10.3)
Chloride: 98 mmol/L (ref 98–111)
Creatinine, Ser: 1.13 mg/dL (ref 0.61–1.24)
GFR calc non Af Amer: >60 mL/min (ref >60)
Glucose, Bld: 112 mg/dL — ABNORMAL HIGH (ref 70–99)
Potassium: 4.3 mmol/L (ref 3.5–5.1)
Sodium: 135 mmol/L (ref 135–145)

## 2019-11-07 MED ORDER — ASPIRIN 81 MG PO TBEC: 81.0000 mg | DELAYED_RELEASE_TABLET | Freq: Every day | ORAL | 0 refills | Status: AC

## 2019-11-07 MED ORDER — ENSURE ENLIVE PO LIQD: 237.0000 mL | Freq: Three times a day (TID) | ORAL | 12 refills | Status: DC

## 2019-11-07 MED ORDER — CLOPIDOGREL BISULFATE 75 MG PO TABS
75.0000 mg | ORAL_TABLET | Freq: Every day | ORAL | 3 refills | Status: DC
Start: 2019-11-08 — End: 2020-04-14

## 2019-11-07 NOTE — Plan of Care (Signed)
DISCHARGE NOTE SNF [name] to be discharged to [name] per MD order. Patient verbalized understanding.  Skin clean, dry and intact without evidence of skin break down, no evidence of skin tears noted. IV catheter discontinued intact. Site without signs and symptoms of complications. Dressing and pressure applied. Pt denies pain at the site currently. No complaints noted.  Patient free of lines, drains, and wounds.   Discharge packet assembled. An After Visit Summary (AVS) was printed and given to the EMS personnel. Patient escorted via stretcher and discharged to [name] via ambulance. Report called to accepting facility; all questions and concerns addressed.   Stephan Minister, RN

## 2019-11-07 NOTE — Progress Notes (Signed)
Physical Therapy Treatment Patient Details Name: [name] [name] MRN: [id number] DOB: 1940-03-03 Today's Date: 11/07/2019    History of Present Illness Pt. is a 79 y.o. male with PMHx: LSS, HTN, who comes to [facility] on 11/02/19 after several falls. In ED, MRI shows acute Rt sided corona radiata infarct. Pt also being evaluated for potential accidental opiate overdose and UTI. At baseline pt lives alone, uses RW or [id number], falls frquently.    PT Comments    Pt tolerated treatment well today. Pt able to improve overall assistance levels, ambulation distance, and activity tolerance since last treatment. Pt required CGA for bed mobility, CGA-minA for STS transfers with RW, and CGA for ambulation with RW. Pt able to improve overall gait with mod cues for step length, BOS, and walker proximity. Despite improvements, pt continues to demonstrate poor safety awareness, decreased activity tolerance, and decreased balance. Increased time required during session for pt self care ADL's and medication administration via RN. Pt will continue to benefit from skilled acute PT services to address deficits for return to baseline function. Will continue to recommend SNF at DC to improve overall deficits and safety with functional mobility prior to return home.     Follow Up Recommendations  SNF     Equipment Recommendations  None recommended by PT    Recommendations for Other Services       Precautions / Restrictions Precautions Precautions: Fall Restrictions Weight Bearing Restrictions: No    Mobility  Bed Mobility Overal bed mobility: Needs Assistance Bed Mobility: Supine to Sit     Supine to sit: Min guard     General bed mobility comments: Min guard for safety to sit EOB and BUE support. Pt required increased time/effort to perform mobility, with verbal cues for hand placement on bedrail to facilitate transfer.  Transfers Overall transfer level: Needs assistance Equipment used: Rolling  walker (2 wheeled) Transfers: Sit to/from Stand Sit to Stand: Min guard;Min assist         General transfer comment: CGA to stand from elevated bed height with RW; Min A to stand from recliner x2 with RW. Verbal cues provided for hand placement to ensure safety with transfer. Pt required increased time/effort to perform mobility.  Ambulation/Gait Ambulation/Gait assistance: Min guard Gait Distance (Feet): 90 Feet Assistive device: Rolling walker (2 wheeled)       General Gait Details: Pt required min guard for safety to ambulate with RW. Pt demonstrated narrow BOS, decreased step length and foot clearance R>L, slowed cadence, trunk flexion, and step to gait pattern that progressed to early reciprocal gait. Verbal cues for walker proximity, step length, foot clearance, BOS, and upright posture.   Stairs             Wheelchair Mobility    Modified Rankin (Stroke Patients Only)       Balance Overall balance assessment: Needs assistance Sitting-balance support: Feet supported Sitting balance-Leahy Scale: Fair Sitting balance - Comments: Pt able to adjust socks while seated at EOB without LOB.   Standing balance support: Bilateral upper extremity supported;During functional activity Standing balance-Leahy Scale: Poor Standing balance comment: CGA for ambulation. Pt able to tolerate ~76min of standing for self care ADL's with BUE support on RW and SBA for safety; pt demonstrated wide BOS.                            Cognition Arousal/Alertness: Awake/alert Behavior During Therapy: Flat affect Overall Cognitive Status: No  family/caregiver present to determine baseline cognitive functioning Area of Impairment: Problem solving;Awareness;Safety/judgement                         Safety/Judgement: Decreased awareness of safety;Decreased awareness of deficits Awareness: Intellectual Problem Solving: Slow processing;Decreased initiation General Comments: Pt  alert and oriented x3, increased processing time/problem solving/initiation, mild confusion noted      Exercises Other Exercises Other Exercises: Pt tolerated static standing balance with RW for ~28min for self care ADL's. Other Exercises: Pt able to perform x15 of BLE exercises with increased cognitive load (counting) including: ankle pumps, LAQ, marching, and hip ABD/ADD.    General Comments        Pertinent Vitals/Pain Pain Assessment: No/denies pain    Home Living                      Prior Function            PT Goals (current goals can now be found in the care plan section) Progress towards PT goals: Progressing toward goals    Frequency    Min 2X/week      PT Plan Current plan remains appropriate    Co-evaluation              AM-PAC PT "6 Clicks" Mobility   Outcome Measure  Help needed turning from your back to your side while in a flat bed without using bedrails?: A Little Help needed moving from lying on your back to sitting on the side of a flat bed without using bedrails?: A Little Help needed moving to and from a bed to a chair (including a wheelchair)?: A Little Help needed standing up from a chair using your arms (e.g., wheelchair or bedside chair)?: A Little Help needed to walk in hospital room?: A Little Help needed climbing 3-5 steps with a railing? : A Lot 6 Click Score: 17    End of Session Equipment Utilized During Treatment: Gait belt Activity Tolerance: Patient tolerated treatment well Patient left: in chair;with call bell/phone within reach;with chair alarm set Nurse Communication: Mobility status PT Visit Diagnosis: Difficulty in walking, not elsewhere classified (R26.2);Other abnormalities of gait and mobility (R26.89);Unsteadiness on feet (R26.81)     Time: 1610-9604 PT Time Calculation (min) (ACUTE ONLY): 49 min  Charges:  $Gait Training: 8-22 mins $Therapeutic Exercise: 8-22 mins $Therapeutic Activity: 8-22  mins                     Herminio Commons, PT, DPT 12:28 PM,11/07/19

## 2019-11-07 NOTE — Discharge Summary (Signed)
Physician Discharge Summary  Ahad Colarusso AST:[id number] DOB: 1940-12-09 DOA: 11/01/2019  PCP: [name], [name], MD  Admit date: 11/01/2019 Discharge date: 11/07/2019  Admitted From: HOME.  Disposition:  SNF  Recommendations for Outpatient Follow-up:  1. Follow up with PCP in 1-2 weeks 2. Please obtain BMP/CBC in one week Please follow up with neurology as recommended.   Discharge Condition: STABLE.  CODE STATUS: full code.  Diet recommendation: Heart Healthy    Brief/Interim Summary: WilliamMcCauleyis a79 y.o.African-American malewith a known history of lumbar spine stenosis with chronic back pain and urolithiasis, who presented to the emergency room with acute onset of altered mental status with confusion with suspected accidental overdose with opiates.    Initial CT of the head without contrast shows stable atrophy and chronic small vessel ischemic disease with remote lacunar infarcts in the basal ganglia.  MRI of the brain shows acute infarct in the right corona radiator with additional punctate infarcts in the right parietal lobe white matter with remote lacunar infarcts involving the bilateral thalami, from bifrontal white matter and pons.  He was admitted for evaluation of acute CVA   Discharge Diagnoses:  Principal Problem:   CVA (cerebral vascular accident) [facility]) Active Problems:   Spinal stenosis, lumbar region, with neurogenic claudication   Generalized osteoarthritis of multiple sites   Hypertension, essential, benign   Ambulatory dysfunction   Weakness   AMS (altered mental status)   Acute lower UTI  Acute infarct in the right corona radiata: No new focal deficits seen Neurology consulted recommended aspirin and Plavix for 3 weeks followed by Plavix alone He was not a candidate for TPA as he came in outside the window PT OT evaluation recommending SNF SLP evaluation recommending dysphagia 2 diet Echocardiogram does not show any thrombus. A1c is  5.7, LDL is 57    Acute metabolic encephalopathy probably secondary to combination of  CVA, opioid overdose Patient appears to be at baseline at this time.  Hyperlipidemia Continue the statin.   Chronic back pain Continue with Tylenol.   Essential hypertension blood pressure parameters are well controlled.   Currently waiting for a bed at SNF patient is medically stable for discharge    Discharge Instructions  Discharge Instructions    Diet - low sodium heart healthy   Complete by: As directed    Discharge instructions   Complete by: As directed    Follow up with neurology as recommended.   Increase activity slowly   Complete by: As directed      Allergies as of 11/07/2019   No Known Allergies     Medication List    STOP taking these medications   hydrochlorothiazide 12.5 MG tablet Commonly known as: HYDRODIURIL     TAKE these medications   aspirin 81 MG EC tablet Take 1 tablet (81 mg total) by mouth daily for 21 days. Swallow whole. Start taking on: November 08, 2019   clopidogrel 75 MG tablet Commonly known as: PLAVIX Take 1 tablet (75 mg total) by mouth daily. Start taking on: November 08, 2019   DULoxetine 60 MG capsule Commonly known as: Cymbalta Take 1 capsule (60 mg total) by mouth daily.   feeding supplement (ENSURE ENLIVE) Liqd Take 237 mLs by mouth 3 (three) times daily between meals.   furosemide 20 MG tablet Commonly known as: LASIX 2 tabs daily for 5 days then 1-2 tabs daily as needed for leg swelling.   losartan 50 MG tablet Commonly known as: COZAAR Take 1 tablet (50  mg total) by mouth daily.   nicotine 21 mg/24hr patch Commonly known as: NICODERM CQ - dosed in mg/24 hours Place 1 patch (21 mg total) onto the skin daily.   Oxycodone HCl 10 MG Tabs Take 1 tablet (10 mg total) by mouth 2 (two) times daily as needed (for moderate - severe pain).   polyethylene glycol 17 g packet Commonly known as: MIRALAX /  GLYCOLAX Take 34 g by mouth 2 (two) times daily.   potassium chloride SA 20 MEQ tablet Commonly known as: KLOR-CON Take 1 tablet (20 mEq total) by mouth daily as needed (When taking fluid pill.).   rosuvastatin 10 MG tablet Commonly known as: Crestor Take 1 tablet (10 mg total) by mouth daily.       Contact information for follow-up providers    [name], [name], MD. Schedule an appointment as soon as possible for a visit in 1 week(s).   Specialty: Family Medicine Contact information: [place] [address] [phone]            Contact information for after-discharge care    Destination    [facility] [place] [facility] .   Service: Skilled Nursing Contact information: [address] [place] [phone]                 No Known Allergies  Consultations:  Neurology.    Procedures/Studies: DG Chest 1 View  Result Date: 11/01/2019 CLINICAL DATA:  Increased weakness and altered mental status. EXAM: CHEST  1 VIEW COMPARISON:  May 03, 2019 FINDINGS: The heart size and mediastinal contours are within normal limits. Both lungs are clear. Multilevel degenerative changes seen within the mid and lower thoracic spine. IMPRESSION: No acute cardiopulmonary disease. Electronically Signed   By: Virgina Norfolk M.D.   On: 11/01/2019 22:41   CT Head Wo Contrast  Result Date: 11/01/2019 CLINICAL DATA:  Mental status change, unknown callus. EXAM: CT HEAD WITHOUT CONTRAST TECHNIQUE: Contiguous axial images were obtained from the base of the skull through the vertex without intravenous contrast. COMPARISON:  Head CT 09/19/2019 FINDINGS: Brain: Stable degree of atrophy and chronic small vessel ischemia. Remote lacunar infarct in the right greater than left basal ganglia. No evidence of acute infarction, hemorrhage, hydrocephalus, extra-axial collection or mass lesion/mass effect. Vascular: Atherosclerosis of  skullbase vasculature without hyperdense vessel or abnormal calcification. Skull: No fracture or focal lesion. Sinuses/Orbits: Remote medial left orbital fracture. Bilateral cataract resection. Mastoid air cells are clear. No acute findings. Other: None. IMPRESSION: 1. No acute intracranial abnormality. 2. Stable atrophy and chronic small vessel ischemia. Remote lacunar infarcts in the basal ganglia. Electronically Signed   By: Keith Rake M.D.   On: 11/01/2019 23:29   MR ANGIO HEAD WO CONTRAST  Result Date: 11/03/2019 CLINICAL DATA:  Stroke follow-up EXAM: MRA HEAD WITHOUT CONTRAST TECHNIQUE: Angiographic images of the Circle of Willis were obtained using MRA technique without intravenous contrast. COMPARISON:  Brain MRI 11/02/2019 FINDINGS: Examination is degraded by motion. POSTERIOR CIRCULATION: --Vertebral arteries: Normal right dominant configuration of V4 segments. --Inferior cerebellar arteries: Poorly visualized --Basilar artery: Normal. --Superior cerebellar arteries: Normal. --Posterior cerebral arteries: Normal. ANTERIOR CIRCULATION: --Intracranial internal carotid arteries: Normal. --Anterior cerebral arteries (ACA): Normal. Both A1 segments are present. Patent anterior communicating artery (a-comm). --Middle cerebral arteries (MCA): Normal. IMPRESSION: Motion degraded examination. No emergent large vessel occlusion. Electronically Signed   By: Ulyses Jarred M.D.   On: 11/03/2019 20:34   MR BRAIN WO CONTRAST  Result Date: 11/02/2019 CLINICAL DATA:  Stroke suspected. EXAM: MRI HEAD WITHOUT CONTRAST TECHNIQUE: Multiplanar, multiecho pulse sequences of the brain and surrounding structures were obtained without intravenous contrast. COMPARISON:  Same day head CT. FINDINGS: Brain: Acute infarct involving the right corona radiata. Additional punctate infarct in the right parietal lobe white matter. Remote lacunar infarcts involving the bilateral thalami, bifrontal white matter, and pons. No acute  hemorrhage. No substantial mass effect. Advanced T2/FLAIR hyperintensity in the periventricular white matter, compatible with chronic microvascular ischemic disease. Moderate diffuse cerebral volume loss with ex vacuo ventricular dilation. No hydrocephalus. Vascular: Proximal major intracranial arterial flow voids are maintained at the skull base. Skull and upper cervical spine: Normal marrow signal. Sinuses/Orbits: Remote left medial orbital wall fracture. Mild scattered paranasal sinus mucosal thickening without air-fluid levels. Other: No mastoid effusions. IMPRESSION: 1. Acute infarct in the right corona radiata. Additional punctate infarct in the right parietal lobe white matter. No substantial mass effect. 2. Remote lacunar infarcts involving the bilateral thalami, bifrontal white matter, and pons. 3. Extensive chronic microvascular ischemic disease. Electronically Signed   By: Margaretha Sheffield MD   On: 11/02/2019 15:11   US Carotid Bilateral  Result Date: 11/03/2019 CLINICAL DATA:  79 year old male with CVA EXAM: BILATERAL CAROTID DUPLEX ULTRASOUND TECHNIQUE: Pearline Cables scale imaging, color Doppler and duplex ultrasound were performed of bilateral carotid and vertebral arteries in the neck. COMPARISON:  None. FINDINGS: Criteria: Quantification of carotid stenosis is based on velocity parameters that correlate the residual internal carotid diameter with NASCET-based stenosis levels, using the diameter of the distal internal carotid lumen as the denominator for stenosis measurement. The following velocity measurements were obtained: RIGHT ICA:  Systolic 81 cm/sec, Diastolic 19 cm/sec CCA:  59 cm/sec SYSTOLIC ICA/CCA RATIO:  1.4 ECA:  89 cm/sec LEFT ICA:  Systolic 77 cm/sec, Diastolic 14 cm/sec CCA:  75 cm/sec SYSTOLIC ICA/CCA RATIO:  1.0 ECA:  91 cm/sec Right Brachial SBP: Not acquired Left Brachial SBP: Not acquired RIGHT CAROTID ARTERY: No significant calcifications of the right common carotid artery.  Intermediate waveform maintained. Heterogeneous and partially calcified plaque at the right carotid bifurcation. No significant lumen shadowing. Low resistance waveform of the right ICA. No significant tortuosity. RIGHT VERTEBRAL ARTERY: Antegrade flow with low resistance waveform. LEFT CAROTID ARTERY: No significant calcifications of the left common carotid artery. Intermediate waveform maintained. Heterogeneous and partially calcified plaque at the left carotid bifurcation without significant lumen shadowing. Low resistance waveform of the left ICA. No significant tortuosity. LEFT VERTEBRAL ARTERY:  Antegrade flow with low resistance waveform. IMPRESSION: Color duplex indicates minimal heterogeneous and calcified plaque, with no hemodynamically significant stenosis by duplex criteria in the extracranial cerebrovascular circulation. Signed, Dulcy Fanny. Dellia Nims, RPVI [facility] Radiology Electronically Signed   By: Corrie Mckusick D.O.   On: 11/03/2019 10:42   ECHOCARDIOGRAM COMPLETE  Result Date: 11/04/2019    ECHOCARDIOGRAM REPORT   Patient Name:   FRANCISZEK PLATTEN [facility] Date of Exam: 11/03/2019 Medical Rec #:  [id number]               Height:       70.0 in Accession #:    [id number]              Weight:       130.1 lb Date of Birth:  December 26, 1940               BSA:          1.739 m Patient Age:  79 years                BP:           133/88 mmHg Patient Gender: M                       HR:           70 bpm. Exam Location:  [facility] Procedure: 2D Echo Indications:     Stroke I163.9  History:         Patient has no prior history of Echocardiogram examinations.  Sonographer:     Arville Go RDCS Referring Phys:  [id number] Valeria Batman SHAHMEHDI Diagnosing Phys: Buford Dresser MD IMPRESSIONS  1. Left ventricular ejection fraction, by estimation, is 60 to 65%. The left ventricle has normal function. The left ventricle has no regional wall motion abnormalities. Left  ventricular diastolic parameters were normal.  2. Right ventricular systolic function is normal. The right ventricular size is normal.  3. The mitral valve is normal in structure. Trivial mitral valve regurgitation. No evidence of mitral stenosis.  4. The aortic valve is grossly normal. Aortic valve regurgitation is not visualized. No aortic stenosis is present.  5. The inferior vena cava is normal in size with greater than 50% respiratory variability, suggesting right atrial pressure of 3 mmHg. Comparison(s): No prior Echocardiogram. Conclusion(s)/Recommendation(s): No intracardiac source of embolism detected on this transthoracic study. A transesophageal echocardiogram is recommended to exclude cardiac source of embolism if clinically indicated. FINDINGS  Left Ventricle: Left ventricular ejection fraction, by estimation, is 60 to 65%. The left ventricle has normal function. The left ventricle has no regional wall motion abnormalities. The left ventricular internal cavity size was normal in size. There is  no left ventricular hypertrophy. Left ventricular diastolic parameters were normal. Right Ventricle: The right ventricular size is normal. No increase in right ventricular wall thickness. Right ventricular systolic function is normal. Left Atrium: Left atrial size was normal in size. Right Atrium: Right atrial size was not well visualized. Pericardium: There is no evidence of pericardial effusion. Mitral Valve: The mitral valve is normal in structure. Trivial mitral valve regurgitation. No evidence of mitral valve stenosis. Tricuspid Valve: The tricuspid valve is normal in structure. Tricuspid valve regurgitation is trivial. No evidence of tricuspid stenosis. Aortic Valve: The aortic valve is grossly normal. Aortic valve regurgitation is not visualized. No aortic stenosis is present. Aortic valve mean gradient measures 3.0 mmHg. Aortic valve peak gradient measures 6.1 mmHg. Aortic valve area, by VTI measures 2.69  cm. Pulmonic Valve: The pulmonic valve was not well visualized. Pulmonic valve regurgitation is not visualized. Aorta: The aortic root and ascending aorta are structurally normal, with no evidence of dilitation. Venous: The inferior vena cava is normal in size with greater than 50% respiratory variability, suggesting right atrial pressure of 3 mmHg. IAS/Shunts: The atrial septum is grossly normal.  LEFT VENTRICLE PLAX 2D LVIDd:         4.26 cm  Diastology LVIDs:         2.89 cm  LV e' medial:    7.29 cm/s LV PW:         1.17 cm  LV E/e' medial:  7.1 LV IVS:        0.97 cm  LV e' lateral:   6.53 cm/s LVOT diam:     2.20 cm  LV E/e' lateral: 7.9 LV SV:         56 LV SV Index:  32 LVOT Area:     3.80 cm  RIGHT VENTRICLE RV Basal diam:  3.66 cm RV S prime:     18.80 cm/s TAPSE (M-mode): 2.8 cm LEFT ATRIUM             Index       RIGHT ATRIUM           Index LA diam:        2.80 cm 1.61 cm/m  RA Area:     16.90 cm LA Vol (A2C):   12.7 ml 7.31 ml/m  RA Volume:   50.30 ml  28.93 ml/m LA Vol (A4C):   24.7 ml 14.21 ml/m LA Biplane Vol: 18.5 ml 10.64 ml/m  AORTIC VALVE                   PULMONIC VALVE AV Area (Vmax):    2.52 cm    PV Vmax:       0.88 m/s AV Area (Vmean):   2.47 cm    PV Peak grad:  3.1 mmHg AV Area (VTI):     2.69 cm AV Vmax:           123.00 cm/s AV Vmean:          76.400 cm/s AV VTI:            0.209 m AV Peak Grad:      6.1 mmHg AV Mean Grad:      3.0 mmHg LVOT Vmax:         81.60 cm/s LVOT Vmean:        49.700 cm/s LVOT VTI:          0.148 m LVOT/AV VTI ratio: 0.71  AORTA Ao Root diam: 3.40 cm Ao Asc diam:  3.20 cm MITRAL VALVE               TRICUSPID VALVE MV Area (PHT): 2.73 cm    TV Peak grad:   32.9 mmHg MV Decel Time: 278 msec    TV Vmax:        2.87 m/s MV E velocity: 51.40 cm/s MV A velocity: 84.40 cm/s  SHUNTS MV E/A ratio:  0.61        Systemic VTI:  0.15 m                            Systemic Diam: 2.20 cm Buford Dresser MD Electronically signed by Buford Dresser MD  Signature Date/Time: 11/04/2019/7:49:42 AM    Final        Subjective:  No new complaints.  Discharge Exam: Vitals:   11/07/19 0457 11/07/19 0753  BP: 130/75 136/75  Pulse: (!) 103 68  Resp: 15 18  Temp: 97.8 F (36.6 C) 98.1 F (36.7 C)  SpO2: 100% 100%   Vitals:   11/06/19 2037 11/06/19 2326 11/07/19 0457 11/07/19 0753  BP: 134/70 115/68 130/75 136/75  Pulse: (!) 116 96 (!) 103 68  Resp: 16 16 15 18   Temp: 99.2 F (37.3 C) 97.7 F (36.5 C) 97.8 F (36.6 C) 98.1 F (36.7 C)  TempSrc: Oral Oral Oral Oral  SpO2: 96% 99% 100% 100%  Weight:      Height:        General: Pt is alert, awake, not in acute distress Cardiovascular: RRR, S1/S2 +, no rubs, no gallops Respiratory: CTA bilaterally, no wheezing, no rhonchi Abdominal: Soft, NT, ND, bowel sounds + Extremities: no edema, no cyanosis  The results of significant diagnostics from this hospitalization (including imaging, microbiology, ancillary and laboratory) are listed below for reference.     Microbiology: Recent Results (from the past 240 hour(s))  Respiratory Panel by RT PCR (Flu A&B, Covid) - Nasopharyngeal Swab     Status: None   Collection Time: 11/01/19 10:57 PM   Specimen: Nasopharyngeal Swab  Result Value Ref Range Status   SARS Coronavirus 2 by RT PCR NEGATIVE NEGATIVE Final    Comment: (NOTE) SARS-CoV-2 target nucleic acids are NOT DETECTED.  The SARS-CoV-2 RNA is generally detectable in upper respiratoy specimens during the acute phase of infection. The lowest concentration of SARS-CoV-2 viral copies this assay can detect is 131 copies/mL. A negative result does not preclude SARS-Cov-2 infection and should not be used as the sole basis for treatment or other patient management decisions. A negative result may occur with  improper specimen collection/handling, submission of specimen other than nasopharyngeal swab, presence of viral mutation(s) within the areas targeted by this assay, and  inadequate number of viral copies (<131 copies/mL). A negative result must be combined with clinical observations, patient history, and epidemiological information. The expected result is Negative.  Fact Sheet for Patients:  PinkCheek.be  Fact Sheet for Healthcare Providers:  GravelBags.it  This test is no t yet approved or cleared by the [place] FDA and  has been authorized for detection and/or diagnosis of SARS-CoV-2 by FDA under an Emergency Use Authorization (EUA). This EUA will remain  in effect (meaning this test can be used) for the duration of the COVID-19 declaration under Section 564(b)(1) of the Act, 21 U.S.C. section 360bbb-3(b)(1), unless the authorization is terminated or revoked sooner.     Influenza A by PCR NEGATIVE NEGATIVE Final   Influenza B by PCR NEGATIVE NEGATIVE Final    Comment: (NOTE) The Xpert Xpress SARS-CoV-2/FLU/RSV assay is intended as an aid in  the diagnosis of influenza from Nasopharyngeal swab specimens and  should not be used as a sole basis for treatment. Nasal washings and  aspirates are unacceptable for Xpert Xpress SARS-CoV-2/FLU/RSV  testing.  Fact Sheet for Patients: PinkCheek.be  Fact Sheet for Healthcare Providers: GravelBags.it  This test is not yet approved or cleared by the [place] FDA and  has been authorized for detection and/or diagnosis of SARS-CoV-2 by  FDA under an Emergency Use Authorization (EUA). This EUA will remain  in effect (meaning this test can be used) for the duration of the  Covid-19 declaration under Section 564(b)(1) of the Act, 21  U.S.C. section 360bbb-3(b)(1), unless the authorization is  terminated or revoked. Performed at [facility], [address] [place], [place] [address]   Urine Culture     Status: Abnormal   Collection Time: 11/02/19  2:16 AM   Specimen:  Urine, Random  Result Value Ref Range Status   Specimen Description   Final    URINE, RANDOM Performed at [facility], [address], [place], [place] [address]    Special Requests   Final    NONE Performed at [facility], [address] [place], [place] [address]    Culture (A)  Final    60,000 COLONIES/mL MULTIPLE SPECIES PRESENT, SUGGEST RECOLLECTION   Report Status 11/03/2019 FINAL  Final  Resp Panel by RT PCR (RSV, Flu A&B, Covid) - Nasopharyngeal Swab     Status: None   Collection Time: 11/05/19  5:52 PM   Specimen: Nasopharyngeal Swab  Result Value Ref Range Status  SARS Coronavirus 2 by RT PCR NEGATIVE NEGATIVE Final    Comment: (NOTE) SARS-CoV-2 target nucleic acids are NOT DETECTED.  The SARS-CoV-2 RNA is generally detectable in upper respiratoy specimens during the acute phase of infection. The lowest concentration of SARS-CoV-2 viral copies this assay can detect is 131 copies/mL. A negative result does not preclude SARS-Cov-2 infection and should not be used as the sole basis for treatment or other patient management decisions. A negative result may occur with  improper specimen collection/handling, submission of specimen other than nasopharyngeal swab, presence of viral mutation(s) within the areas targeted by this assay, and inadequate number of viral copies (<131 copies/mL). A negative result must be combined with clinical observations, patient history, and epidemiological information. The expected result is Negative.  Fact Sheet for Patients:  PinkCheek.be  Fact Sheet for Healthcare Providers:  GravelBags.it  This test is no t yet approved or cleared by the [place] FDA and  has been authorized for detection and/or diagnosis of SARS-CoV-2 by FDA under an Emergency Use Authorization (EUA). This EUA will remain  in effect (meaning this test can be used) for the duration of  the COVID-19 declaration under Section 564(b)(1) of the Act, 21 U.S.C. section 360bbb-3(b)(1), unless the authorization is terminated or revoked sooner.     Influenza A by PCR NEGATIVE NEGATIVE Final   Influenza B by PCR NEGATIVE NEGATIVE Final    Comment: (NOTE) The Xpert Xpress SARS-CoV-2/FLU/RSV assay is intended as an aid in  the diagnosis of influenza from Nasopharyngeal swab specimens and  should not be used as a sole basis for treatment. Nasal washings and  aspirates are unacceptable for Xpert Xpress SARS-CoV-2/FLU/RSV  testing.  Fact Sheet for Patients: PinkCheek.be  Fact Sheet for Healthcare Providers: GravelBags.it  This test is not yet approved or cleared by the [place] FDA and  has been authorized for detection and/or diagnosis of SARS-CoV-2 by  FDA under an Emergency Use Authorization (EUA). This EUA will remain  in effect (meaning this test can be used) for the duration of the  Covid-19 declaration under Section 564(b)(1) of the Act, 21  U.S.C. section 360bbb-3(b)(1), unless the authorization is  terminated or revoked.    Respiratory Syncytial Virus by PCR NEGATIVE NEGATIVE Final    Comment: (NOTE) Fact Sheet for Patients: PinkCheek.be  Fact Sheet for Healthcare Providers: GravelBags.it  This test is not yet approved or cleared by the [place] FDA and  has been authorized for detection and/or diagnosis of SARS-CoV-2 by  FDA under an Emergency Use Authorization (EUA). This EUA will remain  in effect (meaning this test can be used) for the duration of the  COVID-19 declaration under Section 564(b)(1) of the Act, 21 U.S.C.  section 360bbb-3(b)(1), unless the authorization is terminated or  revoked. Performed at [facility], [address] [place], [place] [address]      Labs: BNP (last 3 results) No results for input(s):  BNP in the last 8760 hours. Basic Metabolic Panel: Recent Labs  Lab 11/01/19 2257 11/07/19 0413  NA 138 135  K 4.7 4.3  CL 102 98  CO2 28 29  GLUCOSE 89 112*  BUN 16 37*  CREATININE 1.29* 1.13  CALCIUM 9.2 8.8*  MG 1.9  --    Liver Function Tests: Recent Labs  Lab 11/01/19 2257  AST 26  ALT 19  ALKPHOS 107  BILITOT 1.0  PROT 7.9  ALBUMIN 4.2   No results for input(s): LIPASE, AMYLASE in the last  168 hours. Recent Labs  Lab 11/01/19 2257  AMMONIA 21   CBC: Recent Labs  Lab 11/01/19 2257  WBC 9.2  NEUTROABS 6.2  HGB 13.6  HCT 44.2  MCV 85.5  PLT 268   Cardiac Enzymes: No results for input(s): CKTOTAL, CKMB, CKMBINDEX, TROPONINI in the last 168 hours. BNP: Invalid input(s): POCBNP CBG: No results for input(s): GLUCAP in the last 168 hours. D-Dimer No results for input(s): DDIMER in the last 72 hours. Hgb A1c No results for input(s): HGBA1C in the last 72 hours. Lipid Profile No results for input(s): CHOL, HDL, LDLCALC, TRIG, CHOLHDL, LDLDIRECT in the last 72 hours. Thyroid function studies No results for input(s): TSH, T4TOTAL, T3FREE, THYROIDAB in the last 72 hours.  Invalid input(s): FREET3 Anemia work up No results for input(s): VITAMINB12, FOLATE, FERRITIN, TIBC, IRON, RETICCTPCT in the last 72 hours. Urinalysis    Component Value Date/Time   COLORURINE AMBER (A) 11/02/2019 0216   APPEARANCEUR CLOUDY (A) 11/02/2019 0216   APPEARANCEUR Cloudy 01/02/2012 1326   LABSPEC 1.016 11/02/2019 0216   LABSPEC 1.017 01/02/2012 1326   PHURINE 5.0 11/02/2019 0216   GLUCOSEU NEGATIVE 11/02/2019 0216   GLUCOSEU NEGATIVE 09/26/2017 1104   HGBUR LARGE (A) 11/02/2019 0216   BILIRUBINUR NEGATIVE 11/02/2019 0216   BILIRUBINUR Negative 01/02/2012 1326   KETONESUR NEGATIVE 11/02/2019 0216   PROTEINUR 100 (A) 11/02/2019 0216   UROBILINOGEN 0.2 09/26/2017 1104   NITRITE NEGATIVE 11/02/2019 0216   LEUKOCYTESUR NEGATIVE 11/02/2019 0216   LEUKOCYTESUR 3+  01/02/2012 1326   Sepsis Labs Invalid input(s): PROCALCITONIN,  WBC,  LACTICIDVEN Microbiology Recent Results (from the past 240 hour(s))  Respiratory Panel by RT PCR (Flu A&B, Covid) - Nasopharyngeal Swab     Status: None   Collection Time: 11/01/19 10:57 PM   Specimen: Nasopharyngeal Swab  Result Value Ref Range Status   SARS Coronavirus 2 by RT PCR NEGATIVE NEGATIVE Final    Comment: (NOTE) SARS-CoV-2 target nucleic acids are NOT DETECTED.  The SARS-CoV-2 RNA is generally detectable in upper respiratoy specimens during the acute phase of infection. The lowest concentration of SARS-CoV-2 viral copies this assay can detect is 131 copies/mL. A negative result does not preclude SARS-Cov-2 infection and should not be used as the sole basis for treatment or other patient management decisions. A negative result may occur with  improper specimen collection/handling, submission of specimen other than nasopharyngeal swab, presence of viral mutation(s) within the areas targeted by this assay, and inadequate number of viral copies (<131 copies/mL). A negative result must be combined with clinical observations, patient history, and epidemiological information. The expected result is Negative.  Fact Sheet for Patients:  PinkCheek.be  Fact Sheet for Healthcare Providers:  GravelBags.it  This test is no t yet approved or cleared by the [place] FDA and  has been authorized for detection and/or diagnosis of SARS-CoV-2 by FDA under an Emergency Use Authorization (EUA). This EUA will remain  in effect (meaning this test can be used) for the duration of the COVID-19 declaration under Section 564(b)(1) of the Act, 21 U.S.C. section 360bbb-3(b)(1), unless the authorization is terminated or revoked sooner.     Influenza A by PCR NEGATIVE NEGATIVE Final   Influenza B by PCR NEGATIVE NEGATIVE Final    Comment: (NOTE) The Xpert  Xpress SARS-CoV-2/FLU/RSV assay is intended as an aid in  the diagnosis of influenza from Nasopharyngeal swab specimens and  should not be used as a sole basis for treatment. Nasal washings and  aspirates  are unacceptable for Xpert Xpress SARS-CoV-2/FLU/RSV  testing.  Fact Sheet for Patients: PinkCheek.be  Fact Sheet for Healthcare Providers: GravelBags.it  This test is not yet approved or cleared by the [place] FDA and  has been authorized for detection and/or diagnosis of SARS-CoV-2 by  FDA under an Emergency Use Authorization (EUA). This EUA will remain  in effect (meaning this test can be used) for the duration of the  Covid-19 declaration under Section 564(b)(1) of the Act, 21  U.S.C. section 360bbb-3(b)(1), unless the authorization is  terminated or revoked. Performed at [facility], [address] [place], [place] [address]   Urine Culture     Status: Abnormal   Collection Time: 11/02/19  2:16 AM   Specimen: Urine, Random  Result Value Ref Range Status   Specimen Description   Final    URINE, RANDOM Performed at [facility], [address] [place], [place] [address]    Special Requests   Final    NONE Performed at [facility] [address] [place],  [address]    Culture (A)  Final    60,000 COLONIES/mL MULTIPLE SPECIES PRESENT, SUGGEST RECOLLECTION   Report Status 11/03/2019 FINAL  Final  Resp Panel by RT PCR (RSV, Flu A&B, Covid) - Nasopharyngeal Swab     Status: None   Collection Time: 11/05/19  5:52 PM   Specimen: Nasopharyngeal Swab  Result Value Ref Range Status   SARS Coronavirus 2 by RT PCR NEGATIVE NEGATIVE Final    Comment: (NOTE) SARS-CoV-2 target nucleic acids are NOT DETECTED.  The SARS-CoV-2 RNA is generally detectable in upper respiratoy specimens during the acute phase of infection. The lowest concentration of SARS-CoV-2 viral copies this  assay can detect is 131 copies/mL. A negative result does not preclude SARS-Cov-2 infection and should not be used as the sole basis for treatment or other patient management decisions. A negative result may occur with  improper specimen collection/handling, submission of specimen other than nasopharyngeal swab, presence of viral mutation(s) within the areas targeted by this assay, and inadequate number of viral copies (<131 copies/mL). A negative result must be combined with clinical observations, patient history, and epidemiological information. The expected result is Negative.  Fact Sheet for Patients:  PinkCheek.be  Fact Sheet for Healthcare Providers:  GravelBags.it  This test is no t yet approved or cleared by the [place] FDA and  has been authorized for detection and/or diagnosis of SARS-CoV-2 by FDA under an Emergency Use Authorization (EUA). This EUA will remain  in effect (meaning this test can be used) for the duration of the COVID-19 declaration under Section 564(b)(1) of the Act, 21 U.S.C. section 360bbb-3(b)(1), unless the authorization is terminated or revoked sooner.     Influenza A by PCR NEGATIVE NEGATIVE Final   Influenza B by PCR NEGATIVE NEGATIVE Final    Comment: (NOTE) The Xpert Xpress SARS-CoV-2/FLU/RSV assay is intended as an aid in  the diagnosis of influenza from Nasopharyngeal swab specimens and  should not be used as a sole basis for treatment. Nasal washings and  aspirates are unacceptable for Xpert Xpress SARS-CoV-2/FLU/RSV  testing.  Fact Sheet for Patients: PinkCheek.be  Fact Sheet for Healthcare Providers: GravelBags.it  This test is not yet approved or cleared by the [place] FDA and  has been authorized for detection and/or diagnosis of SARS-CoV-2 by  FDA under an Emergency Use Authorization (EUA). This EUA will  remain  in effect (meaning this test can be used) for  the duration of the  Covid-19 declaration under Section 564(b)(1) of the Act, 21  U.S.C. section 360bbb-3(b)(1), unless the authorization is  terminated or revoked.    Respiratory Syncytial Virus by PCR NEGATIVE NEGATIVE Final    Comment: (NOTE) Fact Sheet for Patients: PinkCheek.be  Fact Sheet for Healthcare Providers: GravelBags.it  This test is not yet approved or cleared by the [place] FDA and  has been authorized for detection and/or diagnosis of SARS-CoV-2 by  FDA under an Emergency Use Authorization (EUA). This EUA will remain  in effect (meaning this test can be used) for the duration of the  COVID-19 declaration under Section 564(b)(1) of the Act, 21 U.S.C.  section 360bbb-3(b)(1), unless the authorization is terminated or  revoked. Performed at [facility], [address], [place], [place] [address]      Time coordinating discharge: 35 minutes.   SIGNED:   [name], MD  [facility] 11/07/2019, 1:48 PM

## 2019-11-08 ENCOUNTER — Telehealth: Payer: Self-pay | Admitting: Family Medicine

## 2019-11-08 ENCOUNTER — Other Ambulatory Visit: Payer: Self-pay

## 2019-11-08 DIAGNOSIS — I6330 Cerebral infarction due to thrombosis of unspecified cerebral artery: Secondary | ICD-10-CM | POA: Diagnosis not present

## 2019-11-08 DIAGNOSIS — F17210 Nicotine dependence, cigarettes, uncomplicated: Secondary | ICD-10-CM | POA: Diagnosis not present

## 2019-11-08 DIAGNOSIS — I10 Essential (primary) hypertension: Secondary | ICD-10-CM | POA: Diagnosis not present

## 2019-11-08 DIAGNOSIS — R262 Difficulty in walking, not elsewhere classified: Secondary | ICD-10-CM | POA: Diagnosis not present

## 2019-11-08 NOTE — Telephone Encounter (Signed)
Patient is in rehab and they are wanting to know if he has had his flu shot and another shot.  She will know that one when she gets a return call.

## 2019-11-08 NOTE — Patient Outreach (Signed)
[facility]) Care Management  11/08/2019  [name] [name] [facility] [birth year] [phone]   Rferral received for Community resources for pill packaging.   However patient discharged to rehab facility on 11-07-19.    Plan: RN CM will close case at this time.    Jone Baseman, RN, MSN [facility] [facility] Care Management Coordinator Direct Line [phone] Cell [phone] Toll Free: [phone]  Fax: [phone]

## 2019-11-11 NOTE — Telephone Encounter (Signed)
I spoke with [facility]. Pt has not had his flu vaheccine this year, and she was not told what the other immunization is. She will let them know he has had his Tdap and let us know if he needs anything else.

## 2019-11-12 ENCOUNTER — Inpatient Hospital Stay: Payer: Medicare HMO | Admitting: Family Medicine

## 2019-11-12 ENCOUNTER — Encounter: Payer: Self-pay | Admitting: Family Medicine

## 2019-11-12 ENCOUNTER — Ambulatory Visit (INDEPENDENT_AMBULATORY_CARE_PROVIDER_SITE_OTHER): Payer: Medicare HMO | Admitting: Family Medicine

## 2019-11-12 ENCOUNTER — Other Ambulatory Visit: Payer: Self-pay

## 2019-11-12 VITALS — BP 98/58 | HR 60 | Temp 98.8°F | Resp 20 | Ht 70.0 in | Wt 134.3 lb

## 2019-11-12 DIAGNOSIS — I639 Cerebral infarction, unspecified: Secondary | ICD-10-CM

## 2019-11-12 DIAGNOSIS — Z23 Encounter for immunization: Secondary | ICD-10-CM | POA: Diagnosis not present

## 2019-11-12 DIAGNOSIS — R262 Difficulty in walking, not elsewhere classified: Secondary | ICD-10-CM | POA: Diagnosis not present

## 2019-11-12 DIAGNOSIS — Z1159 Encounter for screening for other viral diseases: Secondary | ICD-10-CM | POA: Diagnosis not present

## 2019-11-12 DIAGNOSIS — I10 Essential (primary) hypertension: Secondary | ICD-10-CM

## 2019-11-12 DIAGNOSIS — M159 Polyosteoarthritis, unspecified: Secondary | ICD-10-CM | POA: Diagnosis not present

## 2019-11-12 DIAGNOSIS — N179 Acute kidney failure, unspecified: Secondary | ICD-10-CM | POA: Diagnosis not present

## 2019-11-12 NOTE — Patient Instructions (Addendum)
A few things to remember from today's visit:   Cerebrovascular accident (CVA), unspecified mechanism ([place])  Ambulatory dysfunction  AKI (acute kidney injury) ([place])  If you need refills please call your pharmacy. Do not use My Chart to request refills or for acute issues that need immediate attention.    Please be sure medication list is accurate. If a new problem present, please set up appointment sooner than planned today.  As far as you are in assisted living or a similar facility I can continue with same pain medication. Continue fall precautions. We need t find assisted living facility. Blood pressure is low, no changes for now but needs to monitor blood pressure regularly.

## 2019-11-12 NOTE — Progress Notes (Signed)
HPI: [name] is a 79 y.o. male, who is here today with aid to follow on recent horpitalization  He was hospitalized from 11/01/2019 to 11/07/2019 due to MS changes with suspected accidental overdose with opioids. U.tox was negative. He was discharged to a SNF.  Initial head CT showed stable atrophy and chronic small vessel ischemia disease with remote lacunar infarcts in the basal ganglia.   Brain MRI showed acute infarct in the right corona radiator with additional punctuate infarcts in the right parietal lobe white matter with remote lacunar infarcts involving the bilateral thalami, from bifrontal white matter and pons.  He is on rosuvastatin 10 mg daily, Aspirin 81 mg, and Plavix 75 mg daily Lab Results  Component Value Date   CHOL 144 11/04/2019   HDL 68 11/04/2019   LDLCALC 61 11/04/2019   TRIG 77 11/04/2019   CHOLHDL 2.1 11/04/2019   Hypertension: He is on losartan 50 mg daily. He is still on furosemide 20 mg daily to help with lower extremity edema. No orthopnea or PND. Negative for severe/frequent headache, visual changes, chest pain, dyspnea, palpitation, or worsening edema.  AKI: He has not noted gross hematuria,decreased urine output,or foam in urine.  Lab Results  Component Value Date   CREATININE 1.13 11/07/2019   BUN 37 (H) 11/07/2019   NA 135 11/07/2019   K 4.3 11/07/2019   CL 98 11/07/2019   CO2 29 11/07/2019   Lab Results  Component Value Date   WBC 9.2 11/01/2019   HGB 13.6 11/01/2019   HCT 44.2 11/01/2019   MCV 85.5 11/01/2019   PLT 268 11/01/2019   Multiple falls in the past few months. At the SNF he is able to walk with a walker, no falls. Mild dysarthria but he has not noted a significant deficit from CVA. Denies dysphagia.  Chronic neck and back pain with radiculopathy. He has been on oxycodone 10 mg twice daily as needed for years. He was following with pain management but due to insurance changes, he was not able to  continue same provided. His son usually with him but he is alone most of the time. His son is staying in his house and brought girlfriend and child. He is not helping with paying bills. He is not planning on going back to his house, looking into assisted living.  Having daily bowel movements, last one today.  Last visit I recommended assisted living and to change for a PCP closer to home, he lives in [place].  He is also on Cymbalta 60 mg daily.  Review of Systems  Constitutional: Positive for fatigue. Negative for activity change, appetite change and fever.  HENT: Negative for mouth sores, nosebleeds and sore throat.   Respiratory: Positive for cough (Productive with clear sputum in the morning, unchanged for a while.). Negative for shortness of breath and wheezing.   Cardiovascular: Negative for chest pain, palpitations and leg swelling.  Gastrointestinal: Negative for abdominal pain, nausea and vomiting.  Genitourinary: Positive for frequency (chronic). Negative for dysuria.  Musculoskeletal: Positive for arthralgias, back pain and gait problem.  Skin: Negative for rash and wound.  Neurological: Negative for syncope and facial asymmetry.  Psychiatric/Behavioral: Negative for confusion and hallucinations.  Rest see pertinent positives and negatives per HPI.  Current Outpatient Medications on File Prior to Visit  Medication Sig Dispense Refill  . aspirin EC 81 MG EC tablet Take 1 tablet (81 mg total) by mouth daily for 21 days. Swallow whole. 21 tablet 0  .  clopidogrel (PLAVIX) 75 MG tablet Take 1 tablet (75 mg total) by mouth daily. 30 tablet 3  . DULoxetine (CYMBALTA) 60 MG capsule Take 1 capsule (60 mg total) by mouth daily. 90 capsule 2  . feeding supplement, ENSURE ENLIVE, (ENSURE ENLIVE) LIQD Take 237 mLs by mouth 3 (three) times daily between meals. 237 mL 12  . furosemide (LASIX) 20 MG tablet 2 tabs daily for 5 days then 1-2 tabs daily as needed for leg swelling. 30 tablet 3   . losartan (COZAAR) 50 MG tablet Take 1 tablet (50 mg total) by mouth daily. 90 tablet 2  . nicotine (NICODERM CQ - DOSED IN MG/24 HOURS) 21 mg/24hr patch Place 1 patch (21 mg total) onto the skin daily. 28 patch 0  . Oxycodone HCl 10 MG TABS Take 1 tablet (10 mg total) by mouth 2 (two) times daily as needed (for moderate - severe pain). 60 tablet 0  . polyethylene glycol (MIRALAX / GLYCOLAX) 17 g packet Take 34 g by mouth 2 (two) times daily.  0  . potassium chloride SA (KLOR-CON) 20 MEQ tablet Take 1 tablet (20 mEq total) by mouth daily as needed (When taking fluid pill.). 30 tablet 3  . rosuvastatin (CRESTOR) 10 MG tablet Take 1 tablet (10 mg total) by mouth daily. 90 tablet 1   No current facility-administered medications on file prior to visit.     Past Medical History:  Diagnosis Date  . Back pain   . Renal disorder    kidney stones  . Spinal stenosis, lumbar region, with neurogenic claudication 07/07/2014   No Known Allergies  Social History   Socioeconomic History  . Marital status: Widowed    Spouse name: Not on file  . Number of children: Not on file  . Years of education: Not on file  . Highest education level: Not on file  Occupational History  . Not on file  Tobacco Use  . Smoking status: Current Every Day Smoker    Packs/day: 0.75    Types: Cigarettes  . Smokeless tobacco: Never Used  Vaping Use  . Vaping Use: Never used  Substance and Sexual Activity  . Alcohol use: No  . Drug use: Never  . Sexual activity: Not on file  Other Topics Concern  . Not on file  Social History Narrative  . Not on file   Social Determinants of Health   Financial Resource Strain:   . Difficulty of Paying Living Expenses: Not on file  Food Insecurity:   . Worried About Charity fundraiser in the Last Year: Not on file  . Ran Out of Food in the Last Year: Not on file  Transportation Needs:   . Lack of Transportation (Medical): Not on file  . Lack of Transportation  (Non-Medical): Not on file  Physical Activity:   . Days of Exercise per Week: Not on file  . Minutes of Exercise per Session: Not on file  Stress:   . Feeling of Stress : Not on file  Social Connections:   . Frequency of Communication with Friends and Family: Not on file  . Frequency of Social Gatherings with Friends and Family: Not on file  . Attends Religious Services: Not on file  . Active Member of Clubs or Organizations: Not on file  . Attends Archivist Meetings: Not on file  . Marital Status: Not on file    Vitals:   11/12/19 1006  BP: (!) 98/58  Pulse: 60  Resp: 20  Temp: 98.8 F (37.1 C)   Body mass index is 19.27 kg/m.  Physical Exam Vitals and nursing note reviewed.  Constitutional:      General: He is not in acute distress.    Appearance: He is well-developed.  HENT:     Head: Normocephalic and atraumatic.     Mouth/Throat:     Mouth: Mucous membranes are moist.     Pharynx: Oropharynx is clear.  Eyes:     Conjunctiva/sclera: Conjunctivae normal.  Cardiovascular:     Rate and Rhythm: Normal rate and regular rhythm.     Heart sounds: No murmur heard.      Comments: Trace pitting edema LE, bilateral. DP pulses present. Pulmonary:     Effort: Pulmonary effort is normal. No respiratory distress.     Breath sounds: Normal breath sounds.  Abdominal:     Palpations: Abdomen is soft.     Tenderness: There is no abdominal tenderness.  Lymphadenopathy:     Cervical: No cervical adenopathy.  Skin:    General: Skin is warm.     Findings: No erythema or rash.  Neurological:     Mental Status: He is alert and oriented to person, place, and time.     Cranial Nerves: Dysarthria present.     Comments: In a wheel chair.  Psychiatric:     Comments: Well groomed, good eye contact.   ASSESSMENT AND PLAN:  [name] was seen today for hospitalization follow-up.  Diagnoses and all orders for this visit:  Orders Placed This Encounter  Procedures  .  Flu Vaccine QUAD High Dose(Fluad)  . Pneumococcal polysaccharide vaccine 23-valent greater than or equal to 2yo subcutaneous/IM  . BASIC METABOLIC PANEL WITH GFR  . Hepatitis C antibody   Lab Results  Component Value Date   CREATININE 1.20 (H) 11/12/2019   BUN 34 (H) 11/12/2019   NA 145 11/12/2019   K 4.7 11/12/2019   CL 106 11/12/2019   CO2 20 11/12/2019    AKI (acute kidney injury) ([place]) No changes in Losartan. Low salt diet to continue and adequate hydration. Further recommendations according to BMP results.  Cerebrovascular accident (CVA), unspecified mechanism ([place]) Residual slurred speech. Continue Crestor 10 mg daily, Plavix 75 mg daily,and Aspirin 81 mg daily.  Ambulatory dysfunction Fall precautions. Continue PT.  Encounter for HCV screening test for low risk patient -     Hepatitis C antibody  Need for influenza vaccination -     Flu Vaccine QUAD High Dose(Flu[facility])  Need for pneumococcal vaccination -     Pneumococcal polysaccharide vaccine 23-valent greater than or equal to 2yo subcutaneous/IM  Hypertension, essential, benign Today BP mildly low. For now no changes but BP needs to be monitor daily. Continue low salt diet. If BP still low, losartan dose will be adjusted.  Osteoarthritis of multiple joints, unspecified osteoarthritis type Chronic pain. Oxycodone 10 mg bid helps. We discussed side effects of medications. If he is in assisted living and somebody is managing his meds, I do not have problem continuing with prescribing med. We discussed current guidelines in regard to chronic opioid use for pain management.   Spent 44 minutes with pt.  During this time history was obtained and documented, examination was performed, prior labs/imaging reviewed, and assessment/plan discussed. SNF form completed. He wants to continue care here and CMA from SNF states that it will not be a problem with transportation.  Return in about 4 weeks (around  12/10/2019).   Jady Braggs G. [name], MD  [facility]. [place] office.   A few things to remember from today's visit:   Cerebrovascular accident (CVA), unspecified mechanism ([place])  Ambulatory dysfunction  AKI (acute kidney injury) ([place])  If you need refills please call your pharmacy. Do not use My Chart to request refills or for acute issues that need immediate attention.    Please be sure medication list is accurate. If a new problem present, please set up appointment sooner than planned today.  As far as you are in assisted living or a similar facility I can continue with same pain medication. Continue fall precautions. We need t find assisted living facility. Blood pressure is low, no changes for now but needs to monitor blood pressure regularly.

## 2019-11-13 LAB — BASIC METABOLIC PANEL WITH GFR
BUN/Creatinine Ratio: 28 (calc) — ABNORMAL HIGH (ref 6–22)
BUN: 34 mg/dL — ABNORMAL HIGH (ref 7–25)
CO2: 20 mmol/L (ref 20–32)
Calcium: 9.3 mg/dL (ref 8.6–10.3)
Chloride: 106 mmol/L (ref 98–110)
Creat: 1.2 mg/dL — ABNORMAL HIGH (ref 0.70–1.18)
GFR, Est African American: 67 mL/min/{1.73_m2} (ref >=60)
GFR, Est Non African American: 58 mL/min/{1.73_m2} — ABNORMAL LOW (ref >=60)
Glucose, Bld: 83 mg/dL (ref 65–99)
Potassium: 4.7 mmol/L (ref 3.5–5.3)
Sodium: 145 mmol/L (ref 135–146)

## 2019-11-13 LAB — HEPATITIS C ANTIBODY
Hepatitis C Ab: NONREACTIVE
SIGNAL TO CUT-OFF: 0.25 (ref <1.00)

## 2019-11-15 DIAGNOSIS — M6281 Muscle weakness (generalized): Secondary | ICD-10-CM | POA: Diagnosis not present

## 2019-11-15 DIAGNOSIS — E785 Hyperlipidemia, unspecified: Secondary | ICD-10-CM | POA: Diagnosis not present

## 2019-11-15 DIAGNOSIS — F17210 Nicotine dependence, cigarettes, uncomplicated: Secondary | ICD-10-CM | POA: Diagnosis not present

## 2019-11-15 DIAGNOSIS — M48062 Spinal stenosis, lumbar region with neurogenic claudication: Secondary | ICD-10-CM | POA: Diagnosis not present

## 2019-11-15 DIAGNOSIS — H04129 Dry eye syndrome of unspecified lacrimal gland: Secondary | ICD-10-CM | POA: Diagnosis not present

## 2019-11-19 ENCOUNTER — Telehealth: Payer: Self-pay

## 2019-11-19 NOTE — Telephone Encounter (Signed)
BP's adequate. Continue BP check weekly. No changes in current antihypertensive therapy. Thanks, BJ

## 2019-11-19 NOTE — Telephone Encounter (Signed)
Nursing home checks BP's daily.

## 2019-11-19 NOTE — Telephone Encounter (Signed)
I called the skilled nursing facility.  His BP's have been: Saturday - 118/66 Sunday - 120/64 Monday - 126/70 Today - 128/76

## 2019-11-20 ENCOUNTER — Telehealth: Payer: Self-pay | Admitting: *Deleted

## 2019-11-20 LAB — BLOOD GAS, VENOUS
Acid-Base Excess: 3.2 mmol/L — ABNORMAL HIGH (ref 0.0–2.0)
Bicarbonate: 33.4 mmol/L — ABNORMAL HIGH (ref 20.0–28.0)
O2 Saturation: 56.6 %
Patient temperature: 37
pCO2, Ven: 71 mmHg (ref 44.0–60.0)
pH, Ven: 7.28 (ref 7.250–7.430)
pO2, Ven: 34 mmHg (ref 32.0–45.0)

## 2019-11-20 NOTE — Telephone Encounter (Signed)
Can you please call [name] to obtain more details. Thanks, BJ

## 2019-11-20 NOTE — Telephone Encounter (Signed)
[name] wanted to talk  With Dr [name] regarding patient. I asked [name] what message could I give Dr [name] and [name] did not go in much detail other than she wants to speak with her about things the patient told her that is going on where he is staying and what people are doing. Please call [name] at [phone]

## 2019-11-21 DIAGNOSIS — R451 Restlessness and agitation: Secondary | ICD-10-CM | POA: Diagnosis not present

## 2019-11-21 DIAGNOSIS — M6281 Muscle weakness (generalized): Secondary | ICD-10-CM | POA: Diagnosis not present

## 2019-11-21 DIAGNOSIS — M48062 Spinal stenosis, lumbar region with neurogenic claudication: Secondary | ICD-10-CM | POA: Diagnosis not present

## 2019-11-21 DIAGNOSIS — F17210 Nicotine dependence, cigarettes, uncomplicated: Secondary | ICD-10-CM | POA: Diagnosis not present

## 2019-11-21 DIAGNOSIS — H04129 Dry eye syndrome of unspecified lacrimal gland: Secondary | ICD-10-CM | POA: Diagnosis not present

## 2019-11-22 NOTE — Telephone Encounter (Signed)
I spoke with pt's sister. I gave her the name of 4 assisted living facilities and their phone numbers. She will let me know which one they choose so we can do the FL2.

## 2019-11-22 NOTE — Telephone Encounter (Signed)
I called pt's sister, she was on the other line & will call me back.

## 2019-11-25 ENCOUNTER — Telehealth: Payer: Self-pay | Admitting: Family Medicine

## 2019-11-25 NOTE — Telephone Encounter (Signed)
I called and spoke with pt's sister. She will contact [facility] to make sure his room is ready & let me know if they need anything else. Pt is looking to be discharged on Wednesday.

## 2019-11-25 NOTE — Telephone Encounter (Signed)
Tried calling [facility] to see if an FL2 is needed. No answer, will try again.

## 2019-11-25 NOTE — Telephone Encounter (Signed)
Pts sister would like to have a follow-up call about the call that she made last week with Dr. [name].  Sister would not elaborate more on it, but just stated that she need to have a call back.

## 2019-11-25 NOTE — Telephone Encounter (Signed)
Patients sister called and stated that patient choose [facility]. If anything else is needed you can call the sister at [place]    Please advise

## 2019-11-25 NOTE — Telephone Encounter (Signed)
Spoke with [facility]. FL2 filled out & faxed.

## 2019-11-26 ENCOUNTER — Telehealth: Payer: Self-pay | Admitting: Family Medicine

## 2019-11-26 DIAGNOSIS — F17210 Nicotine dependence, cigarettes, uncomplicated: Secondary | ICD-10-CM | POA: Diagnosis not present

## 2019-11-26 DIAGNOSIS — M6281 Muscle weakness (generalized): Secondary | ICD-10-CM | POA: Diagnosis not present

## 2019-11-26 DIAGNOSIS — M48062 Spinal stenosis, lumbar region with neurogenic claudication: Secondary | ICD-10-CM | POA: Diagnosis not present

## 2019-11-26 DIAGNOSIS — F0390 Unspecified dementia without behavioral disturbance: Secondary | ICD-10-CM | POA: Diagnosis not present

## 2019-11-26 NOTE — Telephone Encounter (Signed)
I spoke with pt's sister - I will call her back once she is out of her appointment.

## 2019-11-26 NOTE — Telephone Encounter (Signed)
pts sister is calling in stating that she would like to have a call back in reference to the same thing on yesterday.

## 2019-11-26 NOTE — Telephone Encounter (Signed)
Pt's sister will discuss with pt and call back later on today.

## 2019-11-26 NOTE — Telephone Encounter (Signed)
I left a message with [facility] to get pt placed for assisted living.

## 2019-11-26 NOTE — Telephone Encounter (Signed)
I spoke with [facility]. They will call pt's sister to go over everything with her.

## 2019-11-26 NOTE — Telephone Encounter (Signed)
Nothing further needed 

## 2019-11-27 NOTE — Telephone Encounter (Signed)
Patients sister called in stating that patient couldn't afford it and would like a call back from the provider

## 2019-11-27 NOTE — Telephone Encounter (Signed)
I spoke with pt's sister & [facility]. Pt is staying at [facility] until his Medicaid application is approved & they will work to get him placed somewhere he can afford. Sister is aware.

## 2019-11-27 NOTE — Telephone Encounter (Signed)
I'm not sure what else we can do for them.. I don't know if home health would be an option.

## 2019-12-03 DIAGNOSIS — F17210 Nicotine dependence, cigarettes, uncomplicated: Secondary | ICD-10-CM | POA: Diagnosis not present

## 2019-12-03 DIAGNOSIS — I6350 Cerebral infarction due to unspecified occlusion or stenosis of unspecified cerebral artery: Secondary | ICD-10-CM | POA: Diagnosis not present

## 2019-12-03 DIAGNOSIS — R1310 Dysphagia, unspecified: Secondary | ICD-10-CM | POA: Diagnosis not present

## 2019-12-03 DIAGNOSIS — M6281 Muscle weakness (generalized): Secondary | ICD-10-CM | POA: Diagnosis not present

## 2019-12-03 DIAGNOSIS — M48062 Spinal stenosis, lumbar region with neurogenic claudication: Secondary | ICD-10-CM | POA: Diagnosis not present

## 2019-12-04 ENCOUNTER — Telehealth: Payer: Self-pay

## 2019-12-04 NOTE — Telephone Encounter (Signed)
Patients sister is requesting nurse to give her a call back    Please advise

## 2019-12-09 NOTE — Telephone Encounter (Signed)
I called and spoke with pt's sister. She was concerned about him having to come stay with her, she isn't able to take care of him. Advised her that the facility is waiting on his medicaid application to be processed and they will try to get him into another facility where he can stay. Pt's sister verbalized understanding.

## 2019-12-10 ENCOUNTER — Other Ambulatory Visit: Payer: Self-pay | Admitting: *Deleted

## 2019-12-10 NOTE — Patient Outreach (Signed)
[facility]) Care Management  12/10/2019  [name] [name] Oct 29, 1940 [id number]  Transition of care  Referral received: 12/10/19 Referral source: [facility] Discharge notification  Date of Admission : 11/07/19 Diagnosis : CVA Date of Discharge : 12/07/19 Facility: [place]: [facility]   Subjective Referral received for transition of care from inpatient admission at [facility].  Reviewed Patient ping no noted discharge, placed call to [facility] , business office states patient is still a resident at facility and just changed to [facility] pending status on 12/06/19.    Plan Will plan case closure as patient still remains inpatient at [facility].    Joylene Draft, RN, BSN  [place] Management Coordinator  [phone]- Mobile [phone] Toll Free Main Office

## 2019-12-12 DIAGNOSIS — R296 Repeated falls: Secondary | ICD-10-CM | POA: Diagnosis not present

## 2019-12-12 DIAGNOSIS — F17210 Nicotine dependence, cigarettes, uncomplicated: Secondary | ICD-10-CM | POA: Diagnosis not present

## 2019-12-12 DIAGNOSIS — M48062 Spinal stenosis, lumbar region with neurogenic claudication: Secondary | ICD-10-CM | POA: Diagnosis not present

## 2019-12-12 DIAGNOSIS — F0390 Unspecified dementia without behavioral disturbance: Secondary | ICD-10-CM | POA: Diagnosis not present

## 2019-12-12 DIAGNOSIS — M6281 Muscle weakness (generalized): Secondary | ICD-10-CM | POA: Diagnosis not present

## 2019-12-12 DIAGNOSIS — R278 Other lack of coordination: Secondary | ICD-10-CM | POA: Diagnosis not present

## 2019-12-13 DIAGNOSIS — R278 Other lack of coordination: Secondary | ICD-10-CM | POA: Diagnosis not present

## 2019-12-13 DIAGNOSIS — M6281 Muscle weakness (generalized): Secondary | ICD-10-CM | POA: Diagnosis not present

## 2019-12-16 DIAGNOSIS — R278 Other lack of coordination: Secondary | ICD-10-CM | POA: Diagnosis not present

## 2019-12-16 DIAGNOSIS — M6281 Muscle weakness (generalized): Secondary | ICD-10-CM | POA: Diagnosis not present

## 2019-12-17 DIAGNOSIS — R278 Other lack of coordination: Secondary | ICD-10-CM | POA: Diagnosis not present

## 2019-12-17 DIAGNOSIS — M6281 Muscle weakness (generalized): Secondary | ICD-10-CM | POA: Diagnosis not present

## 2019-12-18 DIAGNOSIS — R278 Other lack of coordination: Secondary | ICD-10-CM | POA: Diagnosis not present

## 2019-12-18 DIAGNOSIS — M6281 Muscle weakness (generalized): Secondary | ICD-10-CM | POA: Diagnosis not present

## 2019-12-19 DIAGNOSIS — R278 Other lack of coordination: Secondary | ICD-10-CM | POA: Diagnosis not present

## 2019-12-19 DIAGNOSIS — M6281 Muscle weakness (generalized): Secondary | ICD-10-CM | POA: Diagnosis not present

## 2019-12-20 DIAGNOSIS — M6281 Muscle weakness (generalized): Secondary | ICD-10-CM | POA: Diagnosis not present

## 2019-12-20 DIAGNOSIS — R278 Other lack of coordination: Secondary | ICD-10-CM | POA: Diagnosis not present

## 2019-12-23 DIAGNOSIS — M6281 Muscle weakness (generalized): Secondary | ICD-10-CM | POA: Diagnosis not present

## 2019-12-23 DIAGNOSIS — R278 Other lack of coordination: Secondary | ICD-10-CM | POA: Diagnosis not present

## 2019-12-24 DIAGNOSIS — R278 Other lack of coordination: Secondary | ICD-10-CM | POA: Diagnosis not present

## 2019-12-24 DIAGNOSIS — M6281 Muscle weakness (generalized): Secondary | ICD-10-CM | POA: Diagnosis not present

## 2019-12-25 DIAGNOSIS — J90 Pleural effusion, not elsewhere classified: Secondary | ICD-10-CM | POA: Diagnosis not present

## 2019-12-25 DIAGNOSIS — R278 Other lack of coordination: Secondary | ICD-10-CM | POA: Diagnosis not present

## 2019-12-25 DIAGNOSIS — M6281 Muscle weakness (generalized): Secondary | ICD-10-CM | POA: Diagnosis not present

## 2019-12-25 DIAGNOSIS — R509 Fever, unspecified: Secondary | ICD-10-CM | POA: Diagnosis not present

## 2019-12-25 DIAGNOSIS — R093 Abnormal sputum: Secondary | ICD-10-CM | POA: Diagnosis not present

## 2019-12-25 DIAGNOSIS — J984 Other disorders of lung: Secondary | ICD-10-CM | POA: Diagnosis not present

## 2019-12-26 ENCOUNTER — Inpatient Hospital Stay
Admission: EM | Admit: 2019-12-26 | Discharge: 2020-01-02 | DRG: 193 | Disposition: A | Discharge status: Other Acute Inpatient Hospital | Payer: Medicare HMO | Attending: Internal Medicine | Admitting: Internal Medicine

## 2019-12-26 ENCOUNTER — Other Ambulatory Visit: Payer: Self-pay

## 2019-12-26 ENCOUNTER — Emergency Department: Payer: Medicare HMO

## 2019-12-26 DIAGNOSIS — R6520 Severe sepsis without septic shock: Secondary | ICD-10-CM | POA: Diagnosis not present

## 2019-12-26 DIAGNOSIS — M549 Dorsalgia, unspecified: Secondary | ICD-10-CM | POA: Diagnosis present

## 2019-12-26 DIAGNOSIS — I69319 Unspecified symptoms and signs involving cognitive functions following cerebral infarction: Secondary | ICD-10-CM | POA: Diagnosis not present

## 2019-12-26 DIAGNOSIS — R1310 Dysphagia, unspecified: Secondary | ICD-10-CM

## 2019-12-26 DIAGNOSIS — R4182 Altered mental status, unspecified: Secondary | ICD-10-CM | POA: Diagnosis not present

## 2019-12-26 DIAGNOSIS — Z961 Presence of intraocular lens: Secondary | ICD-10-CM | POA: Diagnosis present

## 2019-12-26 DIAGNOSIS — Z20822 Contact with and (suspected) exposure to covid-19: Secondary | ICD-10-CM | POA: Diagnosis not present

## 2019-12-26 DIAGNOSIS — Z9841 Cataract extraction status, right eye: Secondary | ICD-10-CM

## 2019-12-26 DIAGNOSIS — J181 Lobar pneumonia, unspecified organism: Secondary | ICD-10-CM | POA: Diagnosis present

## 2019-12-26 DIAGNOSIS — G9341 Metabolic encephalopathy: Secondary | ICD-10-CM | POA: Diagnosis present

## 2019-12-26 DIAGNOSIS — J869 Pyothorax without fistula: Secondary | ICD-10-CM | POA: Diagnosis present

## 2019-12-26 DIAGNOSIS — I10 Essential (primary) hypertension: Secondary | ICD-10-CM | POA: Diagnosis not present

## 2019-12-26 DIAGNOSIS — Z79891 Long term (current) use of opiate analgesic: Secondary | ICD-10-CM

## 2019-12-26 DIAGNOSIS — I959 Hypotension, unspecified: Secondary | ICD-10-CM | POA: Diagnosis not present

## 2019-12-26 DIAGNOSIS — R410 Disorientation, unspecified: Secondary | ICD-10-CM | POA: Diagnosis present

## 2019-12-26 DIAGNOSIS — R093 Abnormal sputum: Secondary | ICD-10-CM | POA: Diagnosis not present

## 2019-12-26 DIAGNOSIS — J9601 Acute respiratory failure with hypoxia: Secondary | ICD-10-CM

## 2019-12-26 DIAGNOSIS — E875 Hyperkalemia: Secondary | ICD-10-CM | POA: Diagnosis present

## 2019-12-26 DIAGNOSIS — Z6821 Body mass index (BMI) 21.0-21.9, adult: Secondary | ICD-10-CM

## 2019-12-26 DIAGNOSIS — G9340 Encephalopathy, unspecified: Secondary | ICD-10-CM | POA: Diagnosis not present

## 2019-12-26 DIAGNOSIS — Z9842 Cataract extraction status, left eye: Secondary | ICD-10-CM

## 2019-12-26 DIAGNOSIS — R0902 Hypoxemia: Secondary | ICD-10-CM | POA: Diagnosis not present

## 2019-12-26 DIAGNOSIS — Z79899 Other long term (current) drug therapy: Secondary | ICD-10-CM

## 2019-12-26 DIAGNOSIS — Z87442 Personal history of urinary calculi: Secondary | ICD-10-CM

## 2019-12-26 DIAGNOSIS — J9811 Atelectasis: Secondary | ICD-10-CM | POA: Diagnosis not present

## 2019-12-26 DIAGNOSIS — R918 Other nonspecific abnormal finding of lung field: Secondary | ICD-10-CM | POA: Diagnosis not present

## 2019-12-26 DIAGNOSIS — R4189 Other symptoms and signs involving cognitive functions and awareness: Secondary | ICD-10-CM | POA: Diagnosis present

## 2019-12-26 DIAGNOSIS — E440 Moderate protein-calorie malnutrition: Secondary | ICD-10-CM | POA: Diagnosis present

## 2019-12-26 DIAGNOSIS — A419 Sepsis, unspecified organism: Secondary | ICD-10-CM | POA: Diagnosis not present

## 2019-12-26 DIAGNOSIS — G8929 Other chronic pain: Secondary | ICD-10-CM | POA: Diagnosis present

## 2019-12-26 DIAGNOSIS — I129 Hypertensive chronic kidney disease with stage 1 through stage 4 chronic kidney disease, or unspecified chronic kidney disease: Secondary | ICD-10-CM | POA: Diagnosis present

## 2019-12-26 DIAGNOSIS — I69354 Hemiplegia and hemiparesis following cerebral infarction affecting left non-dominant side: Secondary | ICD-10-CM | POA: Diagnosis not present

## 2019-12-26 DIAGNOSIS — I639 Cerebral infarction, unspecified: Secondary | ICD-10-CM

## 2019-12-26 DIAGNOSIS — F0280 Dementia in other diseases classified elsewhere without behavioral disturbance: Secondary | ICD-10-CM | POA: Diagnosis not present

## 2019-12-26 DIAGNOSIS — M542 Cervicalgia: Secondary | ICD-10-CM | POA: Diagnosis present

## 2019-12-26 DIAGNOSIS — Z9689 Presence of other specified functional implants: Secondary | ICD-10-CM | POA: Diagnosis not present

## 2019-12-26 DIAGNOSIS — F0390 Unspecified dementia without behavioral disturbance: Secondary | ICD-10-CM | POA: Diagnosis present

## 2019-12-26 DIAGNOSIS — Y95 Nosocomial condition: Secondary | ICD-10-CM | POA: Diagnosis present

## 2019-12-26 DIAGNOSIS — F17210 Nicotine dependence, cigarettes, uncomplicated: Secondary | ICD-10-CM | POA: Diagnosis not present

## 2019-12-26 DIAGNOSIS — R627 Adult failure to thrive: Secondary | ICD-10-CM | POA: Diagnosis present

## 2019-12-26 DIAGNOSIS — E785 Hyperlipidemia, unspecified: Secondary | ICD-10-CM | POA: Diagnosis present

## 2019-12-26 DIAGNOSIS — M48062 Spinal stenosis, lumbar region with neurogenic claudication: Secondary | ICD-10-CM | POA: Diagnosis present

## 2019-12-26 DIAGNOSIS — N182 Chronic kidney disease, stage 2 (mild): Secondary | ICD-10-CM | POA: Diagnosis present

## 2019-12-26 DIAGNOSIS — J90 Pleural effusion, not elsewhere classified: Secondary | ICD-10-CM | POA: Diagnosis not present

## 2019-12-26 DIAGNOSIS — G894 Chronic pain syndrome: Secondary | ICD-10-CM | POA: Diagnosis not present

## 2019-12-26 DIAGNOSIS — J918 Pleural effusion in other conditions classified elsewhere: Secondary | ICD-10-CM | POA: Diagnosis present

## 2019-12-26 DIAGNOSIS — Z23 Encounter for immunization: Secondary | ICD-10-CM | POA: Diagnosis present

## 2019-12-26 DIAGNOSIS — Z833 Family history of diabetes mellitus: Secondary | ICD-10-CM | POA: Diagnosis not present

## 2019-12-26 DIAGNOSIS — R404 Transient alteration of awareness: Secondary | ICD-10-CM | POA: Diagnosis not present

## 2019-12-26 DIAGNOSIS — Z4682 Encounter for fitting and adjustment of non-vascular catheter: Secondary | ICD-10-CM | POA: Diagnosis not present

## 2019-12-26 DIAGNOSIS — Z7982 Long term (current) use of aspirin: Secondary | ICD-10-CM | POA: Diagnosis not present

## 2019-12-26 DIAGNOSIS — Z7902 Long term (current) use of antithrombotics/antiplatelets: Secondary | ICD-10-CM

## 2019-12-26 DIAGNOSIS — J189 Pneumonia, unspecified organism: Principal | ICD-10-CM | POA: Diagnosis present

## 2019-12-26 DIAGNOSIS — J439 Emphysema, unspecified: Secondary | ICD-10-CM | POA: Diagnosis not present

## 2019-12-26 DIAGNOSIS — N179 Acute kidney failure, unspecified: Secondary | ICD-10-CM | POA: Diagnosis present

## 2019-12-26 DIAGNOSIS — I69322 Dysarthria following cerebral infarction: Secondary | ICD-10-CM | POA: Diagnosis not present

## 2019-12-26 LAB — COMPREHENSIVE METABOLIC PANEL
ALT: 18 U/L (ref 0–44)
AST: 23 U/L (ref 15–41)
Albumin: 3.2 g/dL — ABNORMAL LOW (ref 3.5–5.0)
Alkaline Phosphatase: 85 U/L (ref 38–126)
Anion gap: 10 (ref 5–15)
BUN: 30 mg/dL — ABNORMAL HIGH (ref 8–23)
CO2: 27 mmol/L (ref 22–32)
Calcium: 8.7 mg/dL — ABNORMAL LOW (ref 8.9–10.3)
Chloride: 98 mmol/L (ref 98–111)
Creatinine, Ser: 1.32 mg/dL — ABNORMAL HIGH (ref 0.61–1.24)
GFR, Estimated: 55 mL/min — ABNORMAL LOW (ref >60)
Glucose, Bld: 130 mg/dL — ABNORMAL HIGH (ref 70–99)
Potassium: 5.4 mmol/L — ABNORMAL HIGH (ref 3.5–5.1)
Sodium: 135 mmol/L (ref 135–145)
Total Bilirubin: 0.9 mg/dL (ref 0.3–1.2)
Total Protein: 8 g/dL (ref 6.5–8.1)

## 2019-12-26 LAB — CBC WITH DIFFERENTIAL/PLATELET
Abs Immature Granulocytes: 0.18 10*3/uL — ABNORMAL HIGH (ref 0.00–0.07)
Basophils Absolute: 0.1 10*3/uL (ref 0.0–0.1)
Basophils Relative: 0 %
Eosinophils Absolute: 0 10*3/uL (ref 0.0–0.5)
Eosinophils Relative: 0 %
HCT: 39 % (ref 39.0–52.0)
Hemoglobin: 12.1 g/dL — ABNORMAL LOW (ref 13.0–17.0)
Immature Granulocytes: 1 %
Lymphocytes Relative: 4 %
Lymphs Abs: 1 10*3/uL (ref 0.7–4.0)
MCH: 25.9 pg — ABNORMAL LOW (ref 26.0–34.0)
MCHC: 31 g/dL (ref 30.0–36.0)
MCV: 83.5 fL (ref 80.0–100.0)
Monocytes Absolute: 2 10*3/uL — ABNORMAL HIGH (ref 0.1–1.0)
Monocytes Relative: 9 %
Neutro Abs: 19.7 10*3/uL — ABNORMAL HIGH (ref 1.7–7.7)
Neutrophils Relative %: 86 %
Platelets: 334 10*3/uL (ref 150–400)
RBC: 4.67 MIL/uL (ref 4.22–5.81)
RDW: 13.3 % (ref 11.5–15.5)
WBC: 23 10*3/uL — ABNORMAL HIGH (ref 4.0–10.5)
nRBC: 0 % (ref 0.0–0.2)

## 2019-12-26 LAB — RESP PANEL BY RT-PCR (FLU A&B, COVID) ARPGX2
Influenza A by PCR: NEGATIVE
Influenza B by PCR: NEGATIVE
SARS Coronavirus 2 by RT PCR: NEGATIVE

## 2019-12-26 LAB — LACTIC ACID, PLASMA
Lactic Acid, Venous: 1.4 mmol/L (ref 0.5–1.9)
Lactic Acid, Venous: 1.4 mmol/L (ref 0.5–1.9)

## 2019-12-26 LAB — TROPONIN I (HIGH SENSITIVITY)
Troponin I (High Sensitivity): 20 ng/L — ABNORMAL HIGH (ref <18)
Troponin I (High Sensitivity): 19 ng/L — ABNORMAL HIGH (ref <18)

## 2019-12-26 LAB — BRAIN NATRIURETIC PEPTIDE: B Natriuretic Peptide: 88.7 pg/mL (ref 0.0–100.0)

## 2019-12-26 MED ORDER — SODIUM CHLORIDE 0.9 % IV SOLN
500.0000 mg | INTRAVENOUS | Status: DC
  Administered 2019-12-26 – 2019-12-29 (×4): 500 mg via INTRAVENOUS
  Filled 2019-12-26 (×5): qty 500

## 2019-12-26 MED ORDER — ONDANSETRON HCL 4 MG/2ML IJ SOLN: 4.0000 mg | Freq: Four times a day (QID) | INTRAMUSCULAR | Status: DC | PRN

## 2019-12-26 MED ORDER — ENOXAPARIN SODIUM 40 MG/0.4ML ~~LOC~~ SOLN
40.0000 mg | SUBCUTANEOUS | Status: DC
  Administered 2019-12-26 – 2020-01-01 (×7): 40 mg via SUBCUTANEOUS
  Filled 2019-12-26 (×6): qty 0.4

## 2019-12-26 MED ORDER — ONDANSETRON HCL 4 MG PO TABS: 4.0000 mg | ORAL_TABLET | Freq: Four times a day (QID) | ORAL | Status: DC | PRN

## 2019-12-26 MED ORDER — SODIUM CHLORIDE 0.9% FLUSH
3.0000 mL | Freq: Two times a day (BID) | INTRAVENOUS | Status: DC
  Administered 2019-12-26 – 2020-01-02 (×13): 3 mL via INTRAVENOUS

## 2019-12-26 MED ORDER — ROSUVASTATIN CALCIUM 10 MG PO TABS
10.0000 mg | ORAL_TABLET | Freq: Every day | ORAL | Status: DC
  Administered 2019-12-27 – 2020-01-02 (×7): 10 mg via ORAL
  Filled 2019-12-26 (×8): qty 1

## 2019-12-26 MED ORDER — SODIUM CHLORIDE 0.9 % IV SOLN
2.0000 g | INTRAVENOUS | Status: AC
  Administered 2019-12-27 – 2019-12-31 (×5): 2 g via INTRAVENOUS
  Filled 2019-12-26: qty 2
  Filled 2019-12-26 (×2): qty 20
  Filled 2019-12-26 (×2): qty 2

## 2019-12-26 MED ORDER — ASPIRIN EC 81 MG PO TBEC
81.0000 mg | DELAYED_RELEASE_TABLET | Freq: Every day | ORAL | Status: DC
  Administered 2019-12-27 – 2020-01-02 (×7): 81 mg via ORAL
  Filled 2019-12-26 (×7): qty 1

## 2019-12-26 MED ORDER — VANCOMYCIN HCL 1500 MG/300ML IV SOLN
1500.0000 mg | Freq: Once | INTRAVENOUS | Status: AC
  Administered 2019-12-26: 1500 mg via INTRAVENOUS
  Filled 2019-12-26: qty 300

## 2019-12-26 MED ORDER — GLYCERIN-HYPROMELLOSE-PEG 400 0.2-0.2-1 % OP SOLN: 2.0000 [drp] | Freq: Two times a day (BID) | OPHTHALMIC | Status: DC

## 2019-12-26 MED ORDER — SODIUM CHLORIDE 0.9 % IV BOLUS
500.0000 mL | Freq: Once | INTRAVENOUS | Status: AC
  Administered 2019-12-26: 500 mL via INTRAVENOUS

## 2019-12-26 MED ORDER — ACETAMINOPHEN 325 MG PO TABS
650.0000 mg | ORAL_TABLET | Freq: Four times a day (QID) | ORAL | Status: DC | PRN
  Administered 2019-12-26 – 2020-01-02 (×7): 650 mg via ORAL
  Filled 2019-12-26 (×7): qty 2

## 2019-12-26 MED ORDER — SODIUM CHLORIDE 0.9 % IV SOLN: INTRAVENOUS | Status: AC

## 2019-12-26 MED ORDER — SODIUM CHLORIDE 0.9 % IV SOLN
2.0000 g | Freq: Two times a day (BID) | INTRAVENOUS | Status: DC
  Administered 2019-12-26: 2 g via INTRAVENOUS
  Filled 2019-12-26: qty 2

## 2019-12-26 MED ORDER — POLYVINYL ALCOHOL 1.4 % OP SOLN
1.0000 [drp] | Freq: Two times a day (BID) | OPHTHALMIC | Status: DC
  Administered 2019-12-26 – 2020-01-02 (×14): 1 [drp] via OPHTHALMIC
  Filled 2019-12-26: qty 15

## 2019-12-26 MED ORDER — CLOPIDOGREL BISULFATE 75 MG PO TABS
75.0000 mg | ORAL_TABLET | Freq: Every day | ORAL | Status: DC
  Administered 2019-12-27 – 2020-01-02 (×7): 75 mg via ORAL
  Filled 2019-12-26 (×7): qty 1

## 2019-12-26 MED ORDER — SODIUM CHLORIDE 0.9 % IV SOLN
2.0000 g | Freq: Once | INTRAVENOUS | Status: DC
  Filled 2019-12-26: qty 2

## 2019-12-26 MED ORDER — VANCOMYCIN HCL 500 MG/100ML IV SOLN
500.0000 mg | Freq: Two times a day (BID) | INTRAVENOUS | Status: DC
  Filled 2019-12-26: qty 100

## 2019-12-26 MED ORDER — VANCOMYCIN HCL IN DEXTROSE 1-5 GM/200ML-% IV SOLN
1000.0000 mg | Freq: Once | INTRAVENOUS | Status: DC
  Filled 2019-12-26: qty 200

## 2019-12-26 MED ORDER — POLYETHYLENE GLYCOL 3350 17 G PO PACK
34.0000 g | PACK | Freq: Two times a day (BID) | ORAL | Status: DC
  Administered 2019-12-26 – 2020-01-01 (×9): 34 g via ORAL
  Filled 2019-12-26 (×12): qty 2

## 2019-12-26 MED ORDER — ACETAMINOPHEN 650 MG RE SUPP: 650.0000 mg | Freq: Four times a day (QID) | RECTAL | Status: DC | PRN

## 2019-12-26 NOTE — ED Notes (Signed)
This nurse and another nurse attempted catheterization for urine sample. Catheter was unable to advance fully. External urinary catheter placed.

## 2019-12-26 NOTE — ED Triage Notes (Signed)
Pt arrives via EMS from [facility] after staff states pt has AMS but could not tell EMS his baseline- pt has a known diagnosis of pneumonia- pt brought in on 2L [place]- per EMS VSS

## 2019-12-26 NOTE — ED Provider Notes (Signed)
[facility] Emergency Department Provider Note ____________________________________________   First MD Initiated Contact with Patient 12/26/19 1507     (approximate)  I have reviewed the triage vital signs and the nursing notes.   HISTORY  Chief Complaint Altered Mental Status  45 caveat: History of present illness limited due to altered mental status.  HPI Padraic Marinos is a 79 y.o. male with PMH as noted below as well as an apparent diagnosis of pneumonia yesterday who presents from his nursing facility with altered mental status.  The patient cannot provide any history.  It is not clear from EMS or the nursing home paperwork with the patient's baseline mental status is.  Past Medical History:  Diagnosis Date  . Back pain   . Renal disorder    kidney stones  . Spinal stenosis, lumbar region, with neurogenic claudication 07/07/2014    Patient Active Problem List   Diagnosis Date Noted  . Lobar pneumonia ([place]) 12/26/2019  . CVA (cerebral vascular accident) ([place]) 11/03/2019  . AMS (altered mental status) 11/02/2019  . Acute lower UTI 11/02/2019  . Weakness 09/20/2019  . Protein-calorie malnutrition, severe 09/20/2019  . AKI (acute kidney injury) ([place]) 09/19/2019  . Ambulatory dysfunction 09/19/2019  . Dehydration 09/19/2019  . Fall at home, initial encounter 09/19/2019  . Aortic atherosclerosis ([place]) 02/16/2019  . Hypertension, essential, benign 08/13/2018  . Urinary frequency 02/05/2018  . PAD (peripheral artery disease) (HCC)-Mild 12/26/2017  . Chronic pain disorder 11/17/2017  . Unstable gait 11/17/2017  . BPH associated with nocturia 09/26/2017  . Allergic rhinitis 06/09/2017  . Tobacco use disorder 02/14/2017  . Generalized osteoarthritis of multiple sites 02/14/2017  . Bilateral lower extremity edema 08/12/2016  . Chronic right-sided low back pain with right-sided sciatica 03/14/2016  . Chronic pain of right knee 03/14/2016  .  DDD (degenerative disc disease), lumbar 07/07/2014  . DDD (degenerative disc disease), cervical 07/07/2014  . DJD (degenerative joint disease) of knee 07/07/2014  . Bilateral occipital neuralgia 07/07/2014  . Spinal stenosis, lumbar region, with neurogenic claudication 07/07/2014    Past Surgical History:  Procedure Laterality Date  . CATARACT EXTRACTION W/PHACO Left 08/22/2016   Procedure: CATARACT EXTRACTION PHACO AND INTRAOCULAR LENS PLACEMENT ([facility]) Left;  Surgeon: [name], MD;  Location: [place];  Service: Ophthalmology;  Laterality: Left;  . CATARACT EXTRACTION W/PHACO Right 10/25/2016   Procedure: CATARACT EXTRACTION PHACO AND INTRAOCULAR LENS PLACEMENT ([place]) WITH ISTENT RIGHT  ;  Surgeon: [name], MD;  Location: [place];  Service: Ophthalmology;  Laterality: Right;  TOPICAL RIGHT  ISTENT   trabecular bypass stent was implanted/explanted.  . INGUINAL HERNIA REPAIR    . KNEE SURGERY Right   . NECK SURGERY      Prior to Admission medications   Medication Sig Start Date End Date Taking? Authorizing Provider  amoxicillin-clavulanate (AUGMENTIN) 875-125 MG tablet Take 1 tablet by mouth 2 (two) times daily. 12/25/19  Yes [provider]  ARTIFICIAL TEARS 0.2-0.2-1 % SOLN Place 2 drops into both eyes 2 (two) times daily. 11/15/19  Yes [provider]  aspirin EC 81 MG tablet Take 81 mg by mouth daily. Swallow whole.   Yes [provider]  clopidogrel (PLAVIX) 75 MG tablet Take 1 tablet (75 mg total) by mouth daily. 11/08/19  Yes [name], MD  DULoxetine (CYMBALTA) 60 MG capsule Take 1 capsule (60 mg total) by mouth daily. 10/15/19  Yes [name], [name], MD  furosemide (LASIX) 20 MG  tablet 2 tabs daily for 5 days then 1-2 tabs daily as needed for leg swelling. 10/16/19  Yes [name], [name], MD  guaifenesin (ROBITUSSIN) 100 MG/5ML syrup Take 200 mg by mouth 3 (three) times daily as needed for cough.   Yes [provider]  losartan (COZAAR) 50 MG tablet Take 1 tablet (50 mg total) by mouth daily. 10/15/19  Yes [name], [name], MD  nicotine (NICODERM CQ - DOSED IN MG/24 HOURS) 21 mg/24hr patch Place 1 patch (21 mg total) onto the skin daily. 09/23/19  Yes [name], MD  Oxycodone HCl 10 MG TABS Take 1 tablet (10 mg total) by mouth 2 (two) times daily as needed (for moderate - severe pain). 10/16/19  Yes [name], [name], MD  polyethylene glycol (MIRALAX / GLYCOLAX) 17 g packet Take 34 g by mouth 2 (two) times daily. 09/22/19  Yes [name], MD  potassium chloride SA (KLOR-CON) 20 MEQ tablet Take 1 tablet (20 mEq total) by mouth daily as needed (When taking fluid pill.). 10/16/19  Yes [name], [name], MD  rosuvastatin (CRESTOR) 10 MG tablet Take 1 tablet (10 mg total) by mouth daily. 10/16/19  Yes [name], [name], MD  feeding supplement, ENSURE ENLIVE, (ENSURE ENLIVE) LIQD Take 237 mLs by mouth 3 (three) times daily between meals. 11/07/19   [name], MD    Allergies Patient has no known allergies.  Family History  Problem Relation Age of Onset  . Diabetes Mother   . Diabetes Brother   . Diabetes Sister     Social History Social History   Tobacco Use  . Smoking status: Current Every Day Smoker    Packs/day: 0.75    Types: Cigarettes  . Smokeless tobacco: Never Used  Vaping Use  . Vaping Use: Never used  Substance Use Topics  . Alcohol use: No  . Drug use: Never    Review of Systems All 5 caveat: Unable to obtain review of systems due to altered mental status    ____________________________________________   PHYSICAL EXAM:  VITAL SIGNS: ED Triage Vitals  Enc Vitals Group     BP 12/26/19 1459 116/70     Pulse Rate 12/26/19 1459 85     Resp 12/26/19 1459 20     Temp 12/26/19 1459 98.8 F (37.1 C)     Temp Source 12/26/19 1459 Axillary     SpO2 12/26/19 1459 (!) 89 %     Weight 12/26/19 1500 147 lb 4.3 oz (66.8 kg)     Height 12/26/19 1500 5\' 10"  (1.778 m)     Head  Circumference --      Peak Flow --      Pain Score --      Pain Loc --      Pain Edu? --      Excl. in [place]? --     Constitutional: Alert, oriented x1.  Somewhat frail-appearing but in no acute distress. Eyes: Conjunctivae are normal.  Head: Atraumatic. Nose: No congestion/rhinnorhea. Mouth/Throat: Mucous membranes are moist.   Neck: Normal range of motion.  Cardiovascular: Normal rate, regular rhythm. Grossly normal heart sounds.  Good peripheral circulation. Respiratory: Normal respiratory effort.  No retractions. Lungs CTAB. Gastrointestinal: Soft and nontender. No distention.  Genitourinary: No flank tenderness. Musculoskeletal: No lower extremity edema.  Extremities warm and well perfused.  Neurologic:  Normal speech and language.  Motor intact in all extremities. Skin:  Skin is warm and dry. No rash noted. Psychiatric: Calm and cooperative.  ____________________________________________   LABS (all labs ordered are listed, but only abnormal results are displayed)  Labs Reviewed  COMPREHENSIVE METABOLIC PANEL - Abnormal; Notable for the following components:      Result Value   Potassium 5.4 (*)    Glucose, Bld 130 (*)    BUN 30 (*)    Creatinine, Ser 1.32 (*)    Calcium 8.7 (*)    Albumin 3.2 (*)    GFR, Estimated 55 (*)    All other components within normal limits  CBC WITH DIFFERENTIAL/PLATELET - Abnormal; Notable for the following components:   WBC 23.0 (*)    Hemoglobin 12.1 (*)    MCH 25.9 (*)    Neutro Abs 19.7 (*)    Monocytes Absolute 2.0 (*)    Abs Immature Granulocytes 0.18 (*)    All other components within normal limits  TROPONIN I (HIGH SENSITIVITY) - Abnormal; Notable for the following components:   Troponin I (High Sensitivity) 20 (*)    All other components within normal limits  RESP PANEL BY RT-PCR (FLU A&B, COVID) ARPGX2  CULTURE, BLOOD (ROUTINE X 2)  CULTURE, BLOOD (ROUTINE X 2)  LACTIC ACID, PLASMA  BRAIN NATRIURETIC PEPTIDE  LACTIC ACID,  PLASMA  URINALYSIS, COMPLETE (UACMP) WITH MICROSCOPIC  TROPONIN I (HIGH SENSITIVITY)   ____________________________________________  EKG  ED ECG REPORT I, Arta Silence, the attending physician, personally viewed and interpreted this ECG.  Date: 12/26/2019 EKG Time: 1502 Rate: 84 Rhythm: normal sinus rhythm QRS Axis: normal Intervals: RBBB, LAFB ST/T Wave abnormalities: normal Narrative Interpretation: no evidence of acute ischemia  ____________________________________________  RADIOLOGY  CXR interpreted by me shows right lower lobe infiltrate  ____________________________________________   PROCEDURES  Procedure(s) performed: No  Procedures  Critical Care performed: No ____________________________________________   INITIAL IMPRESSION / ASSESSMENT AND PLAN / ED COURSE  Pertinent labs & imaging results that were available during my care of the patient were reviewed by me and considered in my medical decision making (see chart for details).  79 year old male with PMH as noted above presents from his nursing facility with altered mental status, although his baseline is not clear.  Per the paperwork, he was diagnosed with pneumonia yesterday.  I reviewed the past medical records in [place].  The patient was most recently admitted in October with altered mental status and concern for possible accidental opiate medication overdose as well as possible acute CVA.  On exam, the patient is overall well-appearing.  His vital signs are normal.  He is alert but only oriented x1.  He is able to tell me he is not having any pain anywhere, but not really able to answer other questions or follow commands.  Neurologic exam is nonfocal.  There is no evidence of trauma.  Exam is otherwise as described above.  Overall I suspect encephalopathy from pneumonia or other infectious etiology versus possible medication side effects, metabolic cause, or less likely cardiac or CNS cause.  We  will obtain a chest x-ray, lab work-up, and reassess.  ----------------------------------------- 5:52 PM on 12/26/2019 -----------------------------------------  Lab work-up reveals leukocytosis.  Lactic acid is normal.  Chest x-ray shows a right lower lobe infiltrate consistent with pneumonia.  I ordered antibiotics for HCAP.  Given the patient's apparent altered mental status and hypoxia, he will need admission.  I discussed this case with Dr. Sarajane Jews from the hospitalist service.  __________________________  Jennye Moccasin was evaluated in Emergency Department on 12/26/2019 for the symptoms described in the history of present illness.  He was evaluated in the context of the global COVID-19 pandemic, which necessitated consideration that the patient might be at risk for infection with the SARS-CoV-2 virus that causes COVID-19. Institutional protocols and algorithms that pertain to the evaluation of patients at risk for COVID-19 are in a state of rapid change based on information released by regulatory bodies including the CDC and federal and state organizations. These policies and algorithms were followed during the patient's care in the ED. ____________________________________________   FINAL CLINICAL IMPRESSION(S) / ED DIAGNOSES  Final diagnoses:  Altered mental status, unspecified altered mental status type  Healthcare-associated pneumonia      NEW MEDICATIONS STARTED DURING THIS VISIT:  New Prescriptions   No medications on file     Note:  This document was prepared using Dragon voice recognition software and may include unintentional dictation errors.   Arta Silence, MD 12/26/19 1753

## 2019-12-26 NOTE — Progress Notes (Signed)
Pharmacy Antibiotic Note  [name] [name] is a 79 y.o. male from SNF admitted on 12/26/2019 with pneumonia.  Pharmacy has been consulted for vancomycin and cefepime dosing.  Patient is afebrile, WBC elevated at 23, SCr is above baseline.  Plan: Vancomycin 1500mg  IV x1 Then start vancomycin 500mg  IV q12h (goal trough 15-20) Cefepime 2g IV q12h Monitor clinical picture, renal function, vanc levels if needed F/U C&S, abx de-escalation, LOT   Height: 5\' 10"  (177.8 cm) Weight: 66.8 kg (147 lb 4.3 oz) IBW/kg (Calculated) : 73  Temp (24hrs), Avg:98.8 F (37.1 C), Min:98.8 F (37.1 C), Max:98.8 F (37.1 C)  Recent Labs  Lab 12/26/19 1510  WBC 23.0*  CREATININE 1.32*  LATICACIDVEN 1.4    Estimated Creatinine Clearance: 43.6 mL/min (A) (by C-G formula based on SCr of 1.32 mg/dL (H)).    No Known Allergies  Antimicrobials this admission: vanc 11/25> Cefepime 11/25>  Microbiology results: 11/25 BCx in process   [name], PharmD Clinical Pharmacist  12/26/2019   4:42 PM

## 2019-12-26 NOTE — ED Notes (Signed)
Hospitalist at bedside 

## 2019-12-26 NOTE — ED Notes (Signed)
Pt oxygen saturation dropped to mid 80s. Oxygen increased to 3 L/min per [place]. Pt currently 97% on 3L.

## 2019-12-26 NOTE — ED Notes (Signed)
EDP at bedside  

## 2019-12-26 NOTE — H&P (Signed)
History and Physical  [name] [name] TDD:[id number] DOB: 25-May-1940 DOA: 12/26/2019  PCP: [name], [name], MD   Chief Complaint: confused  HPI:  79 year old man PMH lacunar infarct October 2021 with residual dysarthria, hypertension, hyperlipidemia, spinal stenosis who was sent from the [facility] with a reported diagnosis of confusion and pneumonia.  Work-up in the emergency department revealed right middle and right lower lobe pneumonia and acute hypoxic respiratory failure.  Patient has dysarthria but speech is intelligible, speaks in very short sentences, answers some questions but is not able to provide any history and denies medical problems.  Appears confused.  Despite multiple attempts with various questions, unable to elicit any further history.  Per EDP and RN, nursing home reported they were unaware of the patient's baseline.  Multiple calls to [facility] resulted in no answer "Cit[employer] messaging system... no operator available".  Per sister by telephone: patient seems to be "going downhill" since the stroke, has had multiple falls at facility, "has dementia", has not been eating well lately  Chart review:  Hospitalized for 3 days early October after presenting with confusion thought to be related to accidental overdose with opiates.  MRI revealed acute infarct.  Seen by speech therapy October 4 at which time evaluation indicated mild cognitive impairment with diet recommended is dysphagia 2 with thin liquids medications whole with pure. Discharged on aspirin, Plavix and oxycodone 10 mg twice daily as needed  ED Course: treated w/ oxygen, NS, cefepime and vancomycin   Review of Systems:  Patient confused, history unreliable.  PMH  Lacunar infarct October 2021 with residual dysarthria  Hypertension  Hyperlipidemia   spinal stenosis  Chronic neck and back pain with radiculopathy on oxycodone for years per PCP note November 12, 2019 . Remainder reviewed in  Epic  Omega . Neck surgery . Knee surgery . Inguinal hernia repair . Bilateral cataract extraction . Remainder reviewed in Epic  Family history includes: . Mother with diabetes . Remainder reviewed in [place] History . Per chart no alcohol  Allergies . None listed  Meds include: Current Outpatient Medications  Medication Instructions  . amoxicillin-clavulanate (AUGMENTIN) 875-125 MG tablet 1 tablet, Oral, 2 times daily  . ARTIFICIAL TEARS 0.2-0.2-1 % SOLN 2 drops, Both Eyes, 2 times daily  . aspirin EC 81 mg, Oral, Daily, Swallow whole.  . clopidogrel (PLAVIX) 75 mg, Oral, Daily  . DULoxetine (CYMBALTA) 60 mg, Oral, Daily  . feeding supplement, ENSURE ENLIVE, (ENSURE ENLIVE) LIQD 237 mLs, Oral, 3 times daily between meals  . furosemide (LASIX) 20 MG tablet 2 tabs daily for 5 days then 1-2 tabs daily as needed for leg swelling.  [name] guaifenesin (ROBITUSSIN) 200 mg, Oral, 3 times daily PRN  . losartan (COZAAR) 50 mg, Oral, Daily  . nicotine (NICODERM CQ - DOSED IN MG/24 HOURS) 21 mg, Transdermal, Daily  . Oxycodone HCl 10 mg, Oral, 2 times daily PRN  . polyethylene glycol (MIRALAX / GLYCOLAX) 34 g, Oral, 2 times daily  . potassium chloride SA (KLOR-CON) 20 MEQ tablet 20 mEq, Oral, Daily PRN  . rosuvastatin (CRESTOR) 10 mg, Oral, Daily    Physicial Exam   Vitals:  Vitals:   12/26/19 1705 12/26/19 1730  BP: 118/71 119/66  Pulse:  85  Resp:  19  Temp:    SpO2:  95%   Constitutional:   . Appears calm and comfortable in the emergency department . Bitemporal wasting Eyes:  . pupils and irises appear normal . Normal lids  ENMT:  . grossly normal hearing  . Lips appear normal . Poor dentition with caries Neck:  . neck appears normal, no masses . no thyromegaly Respiratory:  . CTA bilaterally, no w/r/r.  . Respiratory effort normal. No retractions or accessory muscle use noted Cardiovascular:  . RRR, no m/r/g . No LE extremity edema   Abdomen:  . Abdomen  appears normal; no tenderness or masses . No hernias Musculoskeletal:  . RUE, LUE, RLE, LLE   . strength seems to be globally weak but nonfocal, tone appears grossly normal.  He was able to sit up in bed with assistance for very brief period of time. Skin:  . No rashes, lesions, ulcers . palpation of skin: no induration or nodules Neurologic:  . Moves all extremities.  No gross focal deficits noted except for dysarthria which is chronic. Psychiatric:  . Mental status o Mood, affect difficult to assess o Oriented to self, hospital . judgment and insight appear impaired  I have personally reviewed following labs and imaging studies  Labs:  [name] Potassium 5.4 . Creatinine 1.32.  Anion gap within normal limits. [name] LFTs unremarkable. . Troponin trivial elevation at 20, unclear why this was ordered, no signs or symptoms to suggest ACS . BNP within normal limits . Lactic acid within normal limits . WBC 23 . SARS-CoV-2, influenza negative  Imaging studies:   Chest x-ray independently reviewed showed right mid and lower lung fields infiltrates with associated small parapneumonic effusion  Medical tests:   EKG independently reviewed: Sinus rhythm, right bundle branch block, compared to previous study November 01, 2019 right bundle blanch block is old  ASSESSMENT/PLAN   Right middle and lower lobar pneumonia with associated small parapneumonic effusion, leukocytosis and acute hypoxic respiratory failure, complicated by recent hospitalization --Appears calm and comfortable, hemodynamics are stable lactic acid within normal limits, SpO2 mid to high 90s on 3 L.  Appears nontoxic.  No signs or symptoms to suggest severe illness at this point --Empiric antibiotics, follow-up MRSA PCR, add vanc if positive, follow culture data, continue supplemental oxygen --CBC and BMP in AM  Acute metabolic encephalopathy --Likely secondary to pneumonia, probably mild given history for sister.  Will hold pain  medication given last hospitalization there was concern for adverse drug reaction. --Supportive care  AKI superimposed on CKD stage II (suspected), w/ associated mild hyperkalemia --probably secondary to poor oral intake, complicated by Lasix, losartan, potassium chloride, will hold  --IVF and check BMP in AM  PMH stroke with residual dysarthria --Appears to be stable.  Continue aspirin and Plavix, statin.  Dysphagia, presumably secondary to recent stroke --NPO tonight but probably can advance tomorrow to last recorded by speech therapy dysphagia to, thin liquids, medications whole with pure  Chronic pain disorder, neck and back pain, spinal stenosis --hold oxycodone for now  Mild cognitive impairment as per speech therapy documentation --Further history not available.  Query dementia -- per sister he has dementia.  DVT prophylaxis: SCDs Code Status: Full. No ACP on file. Previously listed as Full Code Family Communication: sister (emergency contact listed) by telephone. Updated on above. She reports she is the point of contact. Consults called: none    Time spent: 60 minutes  Murray Hodgkins, MD  [facility] Direct contact: see [email or web address]  7PM-7AM contact night coverage as below   1. Check the care team in [facility] and look for a) attending/consulting TR[facility] provider listed and b) the [facility] team listed 2. Log into [email or web address] and use [place]'s universal  password to access. If you do not have the password, please contact the hospital operator. 3. Locate the [facility] provider you are looking for under [facility] and page to a number that you can be directly reached. 4. If you still have difficulty reaching the provider, please page the [facility] (Director on Call) for the Hospitalists listed on amion for assistance.  Severity of Illness: The appropriate patient status for this patient is INPATIENT. Inpatient status is judged to be reasonable and necessary in order to provide  the required intensity of service to ensure the patient's safety. The patient's presenting symptoms, physical exam findings, and initial radiographic and laboratory data in the context of their chronic comorbidities is felt to place them at high risk for further clinical deterioration. Furthermore, it is not anticipated that the patient will be medically stable for discharge from the hospital within 2 midnights of admission. The following factors support the patient status of inpatient.   " The patient's presenting symptoms include confusion. " The worrisome physical exam findings include hypoxia, confusion. " The initial radiographic and laboratory data are worrisome because of multilobar pneumonia, leukocytosis, acute kidney injury with hyperkalemia. " The chronic co-morbidities include stroke with residual dysarthria, dysphagia, chronic pain disorder.   * I certify that at the point of admission it is my clinical judgment that the patient will require inpatient hospital care spanning beyond 2 midnights from the point of admission due to high intensity of service, high risk for further deterioration and high frequency of surveillance required.*  Dispo: The patient is from: SNF              Anticipated d/c is to: SNF              Anticipated d/c date is: 3 days              Patient currently is not medically stable to d/c.   12/26/2019, 6:16 PM   Principal Problem:   Lobar pneumonia ([place]) Active Problems:   Spinal stenosis, lumbar region, with neurogenic claudication   Chronic pain disorder   AKI (acute kidney injury) ([place])   CVA (cerebral vascular accident) ([place])   Acute hypoxemic respiratory failure ([place])   Dysphagia

## 2019-12-27 LAB — BASIC METABOLIC PANEL
Anion gap: 8 (ref 5–15)
BUN: 36 mg/dL — ABNORMAL HIGH (ref 8–23)
CO2: 25 mmol/L (ref 22–32)
Calcium: 8.4 mg/dL — ABNORMAL LOW (ref 8.9–10.3)
Chloride: 104 mmol/L (ref 98–111)
Creatinine, Ser: 1.38 mg/dL — ABNORMAL HIGH (ref 0.61–1.24)
GFR, Estimated: 52 mL/min — ABNORMAL LOW (ref >60)
Glucose, Bld: 106 mg/dL — ABNORMAL HIGH (ref 70–99)
Potassium: 4.6 mmol/L (ref 3.5–5.1)
Sodium: 137 mmol/L (ref 135–145)

## 2019-12-27 LAB — CBC
HCT: 32.6 % — ABNORMAL LOW (ref 39.0–52.0)
Hemoglobin: 10.2 g/dL — ABNORMAL LOW (ref 13.0–17.0)
MCH: 26.3 pg (ref 26.0–34.0)
MCHC: 31.3 g/dL (ref 30.0–36.0)
MCV: 84 fL (ref 80.0–100.0)
Platelets: 280 10*3/uL (ref 150–400)
RBC: 3.88 MIL/uL — ABNORMAL LOW (ref 4.22–5.81)
RDW: 13.5 % (ref 11.5–15.5)
WBC: 18.5 10*3/uL — ABNORMAL HIGH (ref 4.0–10.5)
nRBC: 0.1 % (ref 0.0–0.2)

## 2019-12-27 MED ORDER — IPRATROPIUM-ALBUTEROL 0.5-2.5 (3) MG/3ML IN SOLN
3.0000 mL | Freq: Four times a day (QID) | RESPIRATORY_TRACT | Status: DC | PRN
  Administered 2019-12-27 – 2019-12-28 (×2): 3 mL via RESPIRATORY_TRACT
  Filled 2019-12-27 (×2): qty 3

## 2019-12-27 NOTE — Evaluation (Signed)
Clinical/Bedside Swallow Evaluation Patient Details  Name: [name] [name] MRN: [id number] Date of Birth: 10-15-1940  Today's Date: 12/27/2019 Time: SLP Start Time (ACUTE ONLY): 37 SLP Stop Time (ACUTE ONLY): 1340 SLP Time Calculation (min) (ACUTE ONLY): 25 min  Past Medical History:  Past Medical History:  Diagnosis Date   Back pain    Renal disorder    kidney stones   Spinal stenosis, lumbar region, with neurogenic claudication 07/07/2014   Past Surgical History:  Past Surgical History:  Procedure Laterality Date   CATARACT EXTRACTION W/PHACO Left 08/22/2016   Procedure: CATARACT EXTRACTION PHACO AND INTRAOCULAR LENS PLACEMENT ([place]) Left;  Surgeon: [name], MD;  Location: [place];  Service: Ophthalmology;  Laterality: Left;   CATARACT EXTRACTION W/PHACO Right 10/25/2016   Procedure: CATARACT EXTRACTION PHACO AND INTRAOCULAR LENS PLACEMENT ([place]) WITH ISTENT RIGHT  ;  Surgeon: [name], MD;  Location: [place];  Service: Ophthalmology;  Laterality: Right;  TOPICAL RIGHT  ISTENT   trabecular bypass stent was implanted/explanted.   INGUINAL HERNIA REPAIR     KNEE SURGERY Right    NECK SURGERY     HPI:  79 year old man PMH lacunar infarct October 2021 with residual dysarthria, hypertension, hyperlipidemia, spinal stenosis who was sent from the [facility] with a reported diagnosis of confusion and pneumonia.  Work-up in the emergency department revealed right middle and right lower lobe pneumonia and acute hypoxic respiratory failure. Per sister by telephone: patient seems to be "going downhill" since the stroke, has had multiple falls at facility, "has dementia", has not been eating well lately. MRI pending, chest x-ray revealed new RIGHT basilar infiltrate consistent with pneumonia, small parapneumonic RIGHT pleural effusion and questionable minimal atelectasis or infiltrate at medial LEFT base. Of note, pt recieved ST  services during previous admission with recommendation of dypshagia 2 diet with thin liquids, medicine whole in puree. Pt reports eating regular textures at [facility] and pt's sister is uncertain.    Assessment / Plan / Recommendation Clinical Impression  Pt presents with adequate oropharyngeal abilities when consuming last recommended diet, dysphagia 2 with thin liquids via straw. Pt continue to have general oral motor weakness from previous stroke along with missing teeth. Those remaining are in very poor condition. Pt was free of any overt s/s of dysphagia or aspiration with trials. Pt's presentation is commensurate with previous swallowing evaluation. Pt and sister are in agreement with returning pt to dysphagia 2 diet with thin liquids via straw and medicine whole in puree. Pt does present with chest x-ray suggestive of aspiration pneumonia. This may be contributive to conditions surrounding his intake vs. oropharyngeal dysphagia. Pt position, diet textures, distractions at facility will elevate pt's aspiration risk. Pt's sister was present and voiced her agreement. Aspiration precautions shared with pt's sister as well.   SLP Visit Diagnosis: Dysphagia, oropharyngeal phase (R13.12)    Aspiration Risk  Mild aspiration risk    Diet Recommendation Dysphagia 2 (Fine chop);Thin liquid   Liquid Administration via: Straw Medication Administration: Whole meds with puree Supervision: Patient able to self feed;Intermittent supervision to cue for compensatory strategies Compensations: Minimize environmental distractions;Slow rate;Small sips/bites Postural Changes: Seated upright at 90 degrees    Other  Recommendations Oral Care Recommendations: Oral care BID   Follow up Recommendations Skilled Nursing facility      Frequency and Duration            Prognosis        Swallow Study  General Date of Onset: 12/27/19 HPI: 79 year old man PMH lacunar infarct October 2021 with residual  dysarthria, hypertension, hyperlipidemia, spinal stenosis who was sent from the [facility] with a reported diagnosis of confusion and pneumonia.  Work-up in the emergency department revealed right middle and right lower lobe pneumonia and acute hypoxic respiratory failure. Per sister by telephone: patient seems to be "going downhill" since the stroke, has had multiple falls at facility, "has dementia", has not been eating well lately. MRI pending, chest x-ray revealed new RIGHT basilar infiltrate consistent with pneumonia, small parapneumonic RIGHT pleural effusion and questionable minimal atelectasis or infiltrate at medial LEFT base. Of note, pt recieved ST services during previous admission with recommendation of dypshagia 2 diet with thin liquids, medicine whole in puree. Pt reports eating regular textures at [facility] and pt's sister is uncertain.  Type of Study: Bedside Swallow Evaluation Previous Swallow Assessment: 11/2019 - recommended dysphagia 2 diet with thin liquids Diet Prior to this Study: NPO Temperature Spikes Noted: No Respiratory Status: Nasal cannula History of Recent Intubation: No Behavior/Cognition: Alert;Cooperative;Pleasant mood;Confused Oral Cavity Assessment: Within Functional Limits Oral Care Completed by SLP: Recent completion by staff Oral Cavity - Dentition: Poor condition;Missing dentition Vision: Functional for self-feeding Self-Feeding Abilities: Needs assist;Needs set up Patient Positioning: Upright in bed Baseline Vocal Quality: Normal Volitional Cough: Strong Volitional Swallow: Able to elicit    Oral/Motor/Sensory Function Overall Oral Motor/Sensory Function: Within functional limits   Ice Chips Ice chips: Not tested   Thin Liquid Thin Liquid: Within functional limits Presentation: Self Fed;Straw    Nectar Thick Nectar Thick Liquid: Not tested   Honey Thick Honey Thick Liquid: Not tested   Puree Puree: Within functional limits Presentation: Self  Fed;Spoon   Solid     Solid: Within functional limits (dysphagia 2 textures) Presentation: Self Fed     Charmelle Soh B. Rutherford Nail M.S., CCC-SLP, [place] [facility] [place] 12/27/2019,2:04 PM

## 2019-12-27 NOTE — Evaluation (Signed)
Occupational Therapy Evaluation Patient Details Name: [name] [name] MRN: [id number] DOB: 05/15/1940 Today's Date: 12/27/2019    History of Present Illness 79 year old man PMH lacunar infarct October 2021 with residual dysarthria, hypertension, hyperlipidemia, spinal stenosis who was sent from the [facility] with a reported diagnosis of confusion and pneumonia.  Work-up in the emergency department revealed right middle and right lower lobe pneumonia and acute hypoxic respiratory failure.   Clinical Impression   [name] [name] was seen for OT evaluation this date. PTA pt was resident at [facility] SNF receiving PT/OT - reports working on transfers/mobility. Pt presents to acute OT demonstrating impaired ADL performance and functional mobility 2/2 decreased activty tolerance, functional strength/ROM/balance deficits, and decreased insight into deficits. Pt A&O self only. Pt currently requires MAX A for LBD seated EOB. CGA self drinking seated EOB. Anticipate MOD A x2 for BSC t/f. Tolerated ~1 min standing, MOD A for progressive L lateral lean. Pt would benefit from skilled OT to address noted impairments and functional limitations (see below for any additional details) in order to maximize safety and independence while minimizing falls risk and caregiver burden. Upon hospital discharge, recommend STR to maximize pt safety and return to PLOF.     Follow Up Recommendations  SNF    Equipment Recommendations  Other (comment) (TBD)    Recommendations for Other Services       Precautions / Restrictions Precautions Precautions: Fall (aspiration) Restrictions Weight Bearing Restrictions: No      Mobility Bed Mobility Overal bed mobility: Needs Assistance Bed Mobility: Supine to Sit;Sit to Supine     Supine to sit: Mod assist;HOB elevated Sit to supine: Mod assist   General bed mobility comments: assist for trunk    Transfers Overall transfer level: Needs assistance    Transfers: Sit to/from Stand Sit to Stand: Mod assist      General transfer comment: tolerated ~1 min standing, assist for L lateral lean    Balance Overall balance assessment: Needs assistance Sitting-balance support: Bilateral upper extremity supported;Feet supported Sitting balance-Leahy Scale: Good     Standing balance support: Single extremity supported Standing balance-Leahy Scale: Poor      ADL either performed or assessed with clinical judgement   ADL Overall ADL's : Needs assistance/impaired    General ADL Comments: MAX A for LBD seated EOB. CGA self drinking seated EOB. Anticipate MOD A x2 for BSC t/f                   Pertinent Vitals/Pain Pain Assessment: Faces Faces Pain Scale: Hurts little more Pain Location: chest and burning nose from O2 Pain Descriptors / Indicators: Aching;Burning;Discomfort Pain Intervention(s): Limited activity within patient's tolerance;Repositioned     Hand Dominance     Extremity/Trunk Assessment Upper Extremity Assessment Upper Extremity Assessment: Generalized weakness   Lower Extremity Assessment Lower Extremity Assessment: Generalized weakness       Communication Communication Communication: HOH;Expressive difficulties   Cognition Arousal/Alertness: Awake/alert Behavior During Therapy: Flat affect Overall Cognitive Status: Impaired/Different from baseline Area of Impairment: Orientation;Following commands;Safety/judgement;Problem solving                 Orientation Level: Disoriented to;Place;Time     Following Commands: Follows one step commands with increased time Safety/Judgement: Decreased awareness of safety;Decreased awareness of deficits   Problem Solving: Slow processing;Decreased initiation;Requires verbal cues;Requires tactile cues General Comments: Appears mildly confused, decreasing responsiveness to questions as pt fatigues.    General Comments  unable to read SpO2 despite  2 pulse ox used  - increased WOB noted c mobility as pt breathes through mouth     Exercises Exercises: Other exercises Other Exercises Other Exercises: Pt and family educated re: OT role, DME recs, d/c recs, falls prevention, ECS Other Exercises: LBD, self-drinking, sup<>sit, sit<>stand, sitting/standing balance/tolerance, bed mobility   Shoulder Instructions      Home Living Family/patient expects to be discharged to:: Skilled nursing facility          Additional Comments: Pt from [facility], per chart pt plans for LTC after rehab      Prior Functioning/Environment Level of Independence: Needs assistance  Gait / Transfers Assistance Needed: Pt reports working c PT on walking  ADL's / Homemaking Assistance Needed: Pt reports assist for ADLs            OT Problem List: Decreased strength;Decreased range of motion;Decreased activity tolerance;Impaired balance (sitting and/or standing)      OT Treatment/Interventions: Self-care/ADL training;Therapeutic exercise;Energy conservation;DME and/or AE instruction;Therapeutic activities;Balance training;Patient/family education    OT Goals(Current goals can be found in the care plan section) Acute Rehab OT Goals Patient Stated Goal: To return to SNF OT Goal Formulation: With patient/family Time For Goal Achievement: 01/10/20 Potential to Achieve Goals: Good ADL Goals Pt Will Perform Grooming: with modified independence;sitting Pt Will Perform Upper Body Dressing: with min assist;standing (c LRAD PRN) Pt Will Transfer to Toilet: with min assist;stand pivot transfer;bedside commode (c LRAD PRN)  OT Frequency: Min 1X/week    AM-PAC OT "6 Clicks" Daily Activity     Outcome Measure Help from another person eating meals?: A Little Help from another person taking care of personal grooming?: A Little Help from another person toileting, which includes using toliet, bedpan, or urinal?: A Lot Help from another person bathing (including washing,  rinsing, drying)?: A Lot Help from another person to put on and taking off regular upper body clothing?: A Little Help from another person to put on and taking off regular lower body clothing?: A Lot 6 Click Score: 15   End of Session    Activity Tolerance: Patient tolerated treatment well Patient left: in bed;with call bell/phone within reach;with bed alarm set;with family/visitor present  OT Visit Diagnosis: Other abnormalities of gait and mobility (R26.89);Muscle weakness (generalized) (M62.81)                Time: 2423-5361 OT Time Calculation (min): 22 min Charges:  OT General Charges $OT Visit: 1 Visit OT Evaluation $OT Eval Low Complexity: 1 Low OT Treatments $Self Care/Home Management : 8-22 mins  Dessie Coma, M.S. OTR/L  12/27/19, 2:38 PM  ascom [phone]

## 2019-12-27 NOTE — Progress Notes (Addendum)
PROGRESS NOTE    [name] [name]  INO:[id number] DOB: May 18, 1940 DOA: 12/26/2019 PCP: [name], [name], MD   Assessment & Plan:   Principal Problem:   Lobar pneumonia [facility]) Active Problems:   Spinal stenosis, lumbar region, with neurogenic claudication   Chronic pain disorder   AKI (acute kidney injury) ([place])   CVA (cerebral vascular accident) ([place])   Acute hypoxemic respiratory failure ([place])   Dysphagia   Acute encephalopathy    Right middle and lower lobar pneumonia: with associated small parapneumonic effusion. Continue on IV ceftriaxone, azithromycin. Continue on bronchodilators and encourage incentive spirometry. Legionella, strep pneumoniae ordered   Acute hypoxic respiratory failure: secondary to above. Continue on supplemental oxygen and wean as tolerated.    Acute metabolic encephalopathy: likely secondary to pneumonia vs worsening dementia. Continue w/ IV abxs. Continue w/ supportive care  Leukocytosis: likely secondary to pneumonia. Continue on IV abxs  Likely AKI on CKD stage II: likely secondary to poor oral intake & lasix, losartan use. Avoid nephrotoxic meds  Likely ACD: no need for transfusion currently. Will continue to monitor   Hyperkalemia: resolved  Hx of CVA w/ residual dysarthria: continue on aspirin, statin & plavix   Dysphagia: likely secondary to recent CVA. Speech consulted   Chronic pain disorder: of neck and back pain. Hx of spinal stenosis. Continue to hold oxycodone currently   Mild cognitive impairment: as per speech therapy documentation. Hx of dementia as per pt's sister  Moderate protein calorie malnutrition: continue w/ nutritional supplements.    DVT prophylaxis: lovenox Code Status: full Family Communication: discussed pt's care w/ pt's sister, [name], via phone and answered her questions  Disposition Plan: depends on PT/OT recs  Status is: Inpatient  Remains inpatient appropriate because:Altered mental status  and IV treatments appropriate due to intensity of illness or inability to take PO   Dispo: The patient is from: Home              Anticipated d/c is to: SNF              Anticipated d/c date is: 3 days              Patient currently is not medically stable to d/c.       Consultants:      Procedures:   Antimicrobials: azithromycin, ceftriaxone    Subjective: Pt is oriented to person only   Objective: Vitals:   12/27/19 0447 12/27/19 0644 12/27/19 1122 12/27/19 1146  BP: (!) 98/58 105/63 114/67   Pulse: 79  91   Resp: 20  18   Temp: 98.1 F (36.7 C)  97.9 F (36.6 C)   TempSrc: Oral     SpO2: 95%  (!) 88% 94%  Weight:      Height:        Intake/Output Summary (Last 24 hours) at 12/27/2019 1256 Last data filed at 12/27/2019 0500 Gross per 24 hour  Intake 600 ml  Output 400 ml  Net 200 ml   Filed Weights   12/26/19 1500  Weight: 66.8 kg    Examination:  General exam: Appears calm and comfortable  Respiratory system: diminished breath sounds b/l. No rales  Cardiovascular system: S1 & S2 +. No rubs, gallops or clicks.  Gastrointestinal system: Abdomen is nondistended, soft and nontender.  Normal bowel sounds heard. Central nervous system: Oriented to person only Psychiatry: Judgement and insight appear abnormal. Flat mood and affect     Data Reviewed: I have personally reviewed following labs  and imaging studies  CBC: Recent Labs  Lab 12/26/19 1510 12/27/19 0533  WBC 23.0* 18.5*  NEUTROABS 19.7*  --   HGB 12.1* 10.2*  HCT 39.0 32.6*  MCV 83.5 84.0  PLT 334 259   Basic Metabolic Panel: Recent Labs  Lab 12/26/19 1510 12/27/19 0533  NA 135 137  K 5.4* 4.6  CL 98 104  CO2 27 25  GLUCOSE 130* 106*  BUN 30* 36*  CREATININE 1.32* 1.38*  CALCIUM 8.7* 8.4*   GFR: Estimated Creatinine Clearance: 41.7 mL/min (A) (by C-G formula based on SCr of 1.38 mg/dL (H)). Liver Function Tests: Recent Labs  Lab 12/26/19 1510  AST 23  ALT 18   ALKPHOS 85  BILITOT 0.9  PROT 8.0  ALBUMIN 3.2*   No results for input(s): LIPASE, AMYLASE in the last 168 hours. No results for input(s): AMMONIA in the last 168 hours. Coagulation Profile: No results for input(s): INR, PROTIME in the last 168 hours. Cardiac Enzymes: No results for input(s): CKTOTAL, CKMB, CKMBINDEX, TROPONINI in the last 168 hours. BNP (last 3 results) No results for input(s): PROBNP in the last 8760 hours. HbA1C: No results for input(s): HGBA1C in the last 72 hours. CBG: No results for input(s): GLUCAP in the last 168 hours. Lipid Profile: No results for input(s): CHOL, HDL, LDLCALC, TRIG, CHOLHDL, LDLDIRECT in the last 72 hours. Thyroid Function Tests: No results for input(s): TSH, T4TOTAL, FREET4, T3FREE, THYROIDAB in the last 72 hours. Anemia Panel: No results for input(s): VITAMINB12, FOLATE, FERRITIN, TIBC, IRON, RETICCTPCT in the last 72 hours. Sepsis Labs: Recent Labs  Lab 12/26/19 1510 12/26/19 1708  LATICACIDVEN 1.4 1.4    Recent Results (from the past 240 hour(s))  Resp Panel by RT-PCR (Flu A&B, Covid) Nasopharyngeal Swab     Status: None   Collection Time: 12/26/19  3:10 PM   Specimen: Nasopharyngeal Swab; Nasopharyngeal(NP) swabs in vial transport medium  Result Value Ref Range Status   SARS Coronavirus 2 by RT PCR NEGATIVE NEGATIVE Final    Comment: (NOTE) SARS-CoV-2 target nucleic acids are NOT DETECTED.  The SARS-CoV-2 RNA is generally detectable in upper respiratory specimens during the acute phase of infection. The lowest concentration of SARS-CoV-2 viral copies this assay can detect is 138 copies/mL. A negative result does not preclude SARS-Cov-2 infection and should not be used as the sole basis for treatment or other patient management decisions. A negative result may occur with  improper specimen collection/handling, submission of specimen other than nasopharyngeal swab, presence of viral mutation(s) within the areas targeted  by this assay, and inadequate number of viral copies(<138 copies/mL). A negative result must be combined with clinical observations, patient history, and epidemiological information. The expected result is Negative.  Fact Sheet for Patients:  EntrepreneurPulse.com.au  Fact Sheet for Healthcare Providers:  IncredibleEmployment.be  This test is no t yet approved or cleared by the [place] FDA and  has been authorized for detection and/or diagnosis of SARS-CoV-2 by FDA under an Emergency Use Authorization (EUA). This EUA will remain  in effect (meaning this test can be used) for the duration of the COVID-19 declaration under Section 564(b)(1) of the Act, 21 U.S.C.section 360bbb-3(b)(1), unless the authorization is terminated  or revoked sooner.       Influenza A by PCR NEGATIVE NEGATIVE Final   Influenza B by PCR NEGATIVE NEGATIVE Final    Comment: (NOTE) The Xpert Xpress SARS-CoV-2/FLU/RSV plus assay is intended as an aid in the diagnosis of influenza from Nasopharyngeal swab  specimens and should not be used as a sole basis for treatment. Nasal washings and aspirates are unacceptable for Xpert Xpress SARS-CoV-2/FLU/RSV testing.  Fact Sheet for Patients: EntrepreneurPulse.com.au  Fact Sheet for Healthcare Providers: IncredibleEmployment.be  This test is not yet approved or cleared by the [place] FDA and has been authorized for detection and/or diagnosis of SARS-CoV-2 by FDA under an Emergency Use Authorization (EUA). This EUA will remain in effect (meaning this test can be used) for the duration of the COVID-19 declaration under Section 564(b)(1) of the Act, 21 U.S.C. section 360bbb-3(b)(1), unless the authorization is terminated or revoked.  Performed at [facility], [address] [place], [place] [address]   Culture, blood (routine x 2)     Status: None (Preliminary result)    Collection Time: 12/26/19  3:10 PM   Specimen: BLOOD  Result Value Ref Range Status   Specimen Description BLOOD RIGHT ARM  Final   Special Requests   Final    BOTTLES DRAWN AEROBIC AND ANAEROBIC Blood Culture results may not be optimal due to an excessive volume of blood received in culture bottles   Culture   Final    NO GROWTH < 24 HOURS Performed at [facility], [address] [place], [place] [address]    Report Status PENDING  Incomplete  Culture, blood (routine x 2)     Status: None (Preliminary result)   Collection Time: 12/26/19  3:11 PM   Specimen: BLOOD  Result Value Ref Range Status   Specimen Description BLOOD LEFT FORE ARM  Final   Special Requests   Final    BOTTLES DRAWN AEROBIC AND ANAEROBIC Blood Culture adequate volume   Culture   Final    NO GROWTH < 24 HOURS Performed at [facility], [address] [place], [place] [address]    Report Status PENDING  Incomplete         Radiology Studies: DG Chest Portable 1 View  Result Date: 12/26/2019 CLINICAL DATA:  Possible pneumonia, altered mental status EXAM: PORTABLE CHEST 1 VIEW COMPARISON:  Portable exam 1518 hours compared to 11/01/2019 FINDINGS: Normal heart size, mediastinal contours, and pulmonary vascularity. Atherosclerotic calcification aorta. New RIGHT basilar infiltrate consistent with pneumonia likely involving RIGHT middle and RIGHT lower lobes. Small parapneumonic effusion at RIGHT base. Minimal atelectasis or infiltrate at medial LEFT base. Remaining lungs clear. No pneumothorax or acute osseous findings. IMPRESSION: New RIGHT basilar infiltrate consistent with pneumonia. Small parapneumonic RIGHT pleural effusion. Question minimal atelectasis or infiltrate at medial LEFT base. Electronically Signed   By: Lavonia Dana M.D.   On: 12/26/2019 15:40        Scheduled Meds: . aspirin EC  81 mg Oral Daily  . clopidogrel  75 mg Oral Daily  . enoxaparin (LOVENOX) injection  40 mg  Subcutaneous Q24H  . polyethylene glycol  34 g Oral BID  . polyvinyl alcohol  1 drop Both Eyes BID  . rosuvastatin  10 mg Oral Daily  . sodium chloride flush  3 mL Intravenous Q12H   Continuous Infusions: . sodium chloride 50 mL/hr at 12/26/19 1853  . azithromycin 500 mg (12/26/19 2030)  . cefTRIAXone (ROCEPHIN)  IV 2 g (12/27/19 0558)     LOS: 1 day    Time spent: 32 mins     Wyvonnia Dusky, MD [facility] Pager [phone]  If 7PM-7AM, please contact night-coverage 12/27/2019, 12:56 PM

## 2019-12-27 NOTE — Progress Notes (Signed)
Initial Nutrition Assessment  DOCUMENTATION CODES:   Non-severe (moderate) malnutrition in context of social or environmental circumstances  INTERVENTION:  Monitor for diet advancement  Will provide Ensure Enlive po BID, each supplement provides 350 kcal and 20 grams of protein    NUTRITION DIAGNOSIS:   Moderate Malnutrition related to social / environmental circumstances as evidenced by moderate muscle depletion, severe muscle depletion, moderate fat depletion, severe fat depletion.    GOAL:   Patient will meet greater than or equal to 90% of their needs    MONITOR:   PO intake, Weight trends, Labs, I & O's, Diet advancement, Supplement acceptance, Skin  REASON FOR ASSESSMENT:   Malnutrition Screening Tool    ASSESSMENT:    79 year old male admitted for lobar pneumonia. Past medical history significant of lacunar infarct (10/21) with residual dysarthria, HTN, HLD, spinal stenosis presents from [facility] with confusion and reported diagnosis of pneumonia.  Patient sleeping soundly this morning, easily awakened with name call. He reports feeling pretty good and is hungry. He is a poor historian, limited nutrition history obtained.Patient endorses fair appetite at facility, likes almond milk with cereal, drinks vanilla Ensure, usually has puree meat with vegetables for dinner. He is unsure of usual weight, thinks he has lost a few pounds recently. Currently he weighs 66.8 kg (146.96 lbs). Per chart weights have trended up ~13 lbs in the last 6 weeks, however unsure of accuracy of admit weight. Will order daily weights to assess trends. Patient with moderate/severe fat and muscle depletions noted on exam and meets criteria for malnutrition and would greatly benefit from nutrient dense supplement. He is agreeable to drinking vanilla Ensure with diet advancement.   NUTRITION - FOCUSED PHYSICAL EXAM:    Most Recent Value  Orbital Region Moderate depletion  Upper Arm Region  Moderate depletion  Thoracic and Lumbar Region Moderate depletion  Buccal Region Severe depletion  Temple Region Severe depletion  Clavicle and Acromion Bone Region Severe depletion  Scapular Bone Region Moderate depletion  Dorsal Hand Moderate depletion  Patellar Region Moderate depletion  Anterior Thigh Region Moderate depletion  Posterior Calf Region Severe depletion  Edema (RD Assessment) None  Hair Reviewed  Eyes Reviewed  Mouth Reviewed  [poor dentition]  Skin Reviewed  Nails Reviewed       Diet Order:   Diet Order            Diet NPO time specified  Diet effective now                 EDUCATION NEEDS:   No education needs have been identified at this time  Skin:  Skin Assessment: Reviewed RN Assessment  Last BM:  11/25 per pt  Height:   Ht Readings from Last 1 Encounters:  12/26/19 5\' 10"  (1.778 m)    Weight:   Wt Readings from Last 1 Encounters:  12/26/19 66.8 kg    BMI:  Body mass index is 21.13 kg/m.  Estimated Nutritional Needs:   Kcal:  1800-2000  Protein:  90-100  Fluid:  >1.6 L/day    Lajuan Lines, RD, LDN Clinical Nutrition After Hours/Weekend Pager # in [place]

## 2019-12-27 NOTE — Plan of Care (Signed)

## 2019-12-27 NOTE — TOC Initial Note (Addendum)
Transition of Care [facility]) - Initial/Assessment Note    Patient Details  Name: [name] [name] MRN: [id number] Date of Birth: 1940-02-12  Transition of Care [facility]) CM/SW Contact:    [name] Chroman, LCSW Phone Number: 12/27/2019, 9:28 AM  Clinical Narrative:  Per chart review, patient was admitted from [facility]. He originally discharged there November 07, 2019 for rehab. CSW called admissions coordinator and confirmed. She said patient is in process of transition to long-term care at their facility. Family has applied for Medicaid. Patient will need prior authorization for continued rehab prior to return. MD will order PT and OT when appropriate. Left voicemail for patient's sister. No further concerns. CSW will continue to follow patient for support and facilitate return to SNF once medically stable.                9:40 am: Received call back from patient's sister. She confirmed plan to return to [facility] at discharge and waiting on an update from the facility on his LTC transition. CSW left message for admissions coordinator asking her to have someone call her. Sister said patient is unable to come live with her because the manager of her apartment complex will not allow it.  Expected Discharge Plan: Skilled Nursing Facility Barriers to Discharge: Insurance Authorization, Continued Medical Work up   Patient Goals and CMS Choice Patient states their goals for this hospitalization and ongoing recovery are:: Patient not oriented.   Choice offered to / list presented to : NA  Expected Discharge Plan and Services Expected Discharge Plan: [place] Acute Care Choice: Resumption of Svcs/PTA Provider Living arrangements for the past 2 months: [place]                                      Prior Living Arrangements/Services Living arrangements for the past 2 months: [facility] Lives with:: Facility  Resident Patient language and need for interpreter reviewed:: Yes Do you feel safe going back to the place where you live?: Yes      Need for Family Participation in Patient Care: Yes (Comment) Care giver support system in place?: Yes (comment)   Criminal Activity/Legal Involvement Pertinent to Current Situation/Hospitalization: No - Comment as needed  Activities of Daily Living Home Assistive Devices/Equipment: Gilford Rile (specify type) ADL Screening (condition at time of admission) Patient's cognitive ability adequate to safely complete daily activities?: No Is the patient deaf or have difficulty hearing?: No Does the patient have difficulty seeing, even when wearing glasses/contacts?: Yes Does the patient have difficulty concentrating, remembering, or making decisions?: Yes Patient able to express need for assistance with ADLs?: Yes Does the patient have difficulty dressing or bathing?: No Independently performs ADLs?: Yes (appropriate for developmental age) Does the patient have difficulty walking or climbing stairs?: Yes Weakness of Legs: Both Weakness of Arms/Hands: Both  Permission Sought/Granted Permission sought to share information with : Facility Sport and exercise psychologist, Family Supports    Share Information with NAME: [name] [name]  Permission granted to share info w AGENCY: [facility] SN[facility]  Permission granted to share info w Relationship: Sister  Permission granted to share info w Contact Information: [phone]  Emotional Assessment Appearance:: Appears stated age Attitude/Demeanor/Rapport: Unable to Assess Affect (typically observed): Unable to Assess Orientation: :  (Disoriented x 4) Alcohol / Substance Use: Not Applicable Psych Involvement: No (comment)  Admission diagnosis:  Lobar pneumonia ([place]) [J18.1] Healthcare-associated pneumonia [J18.9] Altered mental status, unspecified altered mental status type [R41.82] Patient Active Problem List   Diagnosis Date  Noted  . Lobar pneumonia ([place]) 12/26/2019  . Acute hypoxemic respiratory failure ([place]) 12/26/2019  . Dysphagia 12/26/2019  . Acute encephalopathy 12/26/2019  . CVA (cerebral vascular accident) ([place]) 11/03/2019  . AMS (altered mental status) 11/02/2019  . Acute lower UTI 11/02/2019  . Weakness 09/20/2019  . Protein-calorie malnutrition, severe 09/20/2019  . AKI (acute kidney injury) ([place]) 09/19/2019  . Ambulatory dysfunction 09/19/2019  . Dehydration 09/19/2019  . Fall at home, initial encounter 09/19/2019  . Aortic atherosclerosis ([place]) 02/16/2019  . Hypertension, essential, benign 08/13/2018  . Urinary frequency 02/05/2018  . PAD (peripheral artery disease) (HCC)-Mild 12/26/2017  . Chronic pain disorder 11/17/2017  . Unstable gait 11/17/2017  . BPH associated with nocturia 09/26/2017  . Allergic rhinitis 06/09/2017  . Tobacco use disorder 02/14/2017  . Generalized osteoarthritis of multiple sites 02/14/2017  . Bilateral lower extremity edema 08/12/2016  . Chronic right-sided low back pain with right-sided sciatica 03/14/2016  . Chronic pain of right knee 03/14/2016  . DDD (degenerative disc disease), lumbar 07/07/2014  . DDD (degenerative disc disease), cervical 07/07/2014  . DJD (degenerative joint disease) of knee 07/07/2014  . Bilateral occipital neuralgia 07/07/2014  . Spinal stenosis, lumbar region, with neurogenic claudication 07/07/2014   PCP:  [name], [name] G, MD Pharmacy:   [facility] [place], [place] - [place] AT [facility] [address] [place] [address] Phone: [phone] Fax: [phone]     Social Determinants of Health (SDOH) Interventions    Readmission Risk Interventions No flowsheet data found.

## 2019-12-28 DIAGNOSIS — E440 Moderate protein-calorie malnutrition: Secondary | ICD-10-CM | POA: Insufficient documentation

## 2019-12-28 LAB — BASIC METABOLIC PANEL
Anion gap: 9 (ref 5–15)
BUN: 31 mg/dL — ABNORMAL HIGH (ref 8–23)
CO2: 23 mmol/L (ref 22–32)
Calcium: 8.2 mg/dL — ABNORMAL LOW (ref 8.9–10.3)
Chloride: 105 mmol/L (ref 98–111)
Creatinine, Ser: 1.09 mg/dL (ref 0.61–1.24)
GFR, Estimated: >60 mL/min (ref >60)
Glucose, Bld: 118 mg/dL — ABNORMAL HIGH (ref 70–99)
Potassium: 4.3 mmol/L (ref 3.5–5.1)
Sodium: 137 mmol/L (ref 135–145)

## 2019-12-28 LAB — CBC
HCT: 31.3 % — ABNORMAL LOW (ref 39.0–52.0)
Hemoglobin: 10 g/dL — ABNORMAL LOW (ref 13.0–17.0)
MCH: 26.2 pg (ref 26.0–34.0)
MCHC: 31.9 g/dL (ref 30.0–36.0)
MCV: 81.9 fL (ref 80.0–100.0)
Platelets: 317 10*3/uL (ref 150–400)
RBC: 3.82 MIL/uL — ABNORMAL LOW (ref 4.22–5.81)
RDW: 13.4 % (ref 11.5–15.5)
WBC: 16.4 10*3/uL — ABNORMAL HIGH (ref 4.0–10.5)
nRBC: 0 % (ref 0.0–0.2)

## 2019-12-28 MED ORDER — LIDOCAINE 5 % EX PTCH
1.0000 | MEDICATED_PATCH | CUTANEOUS | Status: DC
  Administered 2019-12-28 – 2020-01-02 (×6): 1 via TRANSDERMAL
  Filled 2019-12-28 (×8): qty 1

## 2019-12-28 MED ORDER — SODIUM CHLORIDE 0.9 % IV SOLN
INTRAVENOUS | Status: DC | PRN
  Administered 2019-12-28 – 2019-12-30 (×3): 1000 mL via INTRAVENOUS
  Administered 2020-01-02: 250 mL via INTRAVENOUS

## 2019-12-28 MED ORDER — GUAIFENESIN-DM 100-10 MG/5ML PO SYRP
5.0000 mL | ORAL_SOLUTION | ORAL | Status: DC | PRN
  Administered 2019-12-28 (×2): 5 mL via ORAL
  Filled 2019-12-28 (×2): qty 5

## 2019-12-28 NOTE — Evaluation (Signed)
Physical Therapy Evaluation Patient Details Name: [name] [name] MRN: [id number] DOB: [birth year] Today's Date: 12/28/2019   History of Present Illness  Pt is a 79 y/o M who presented to ED from [facility] with staff reporting AMS. It is unclear from EMS & nursing home paperwork what pt's baseline mental status is. Pt had recent dx of penumonia. Work up revealed R middle & lower lobe pneumonia & acute hypoxic respiratory failure. PMH: back pain, renal disorder, lumbar spinal stenosis with neurogenic claudication, lacunar infarct 11/2019 with residual dysarthria, HTN, HLD  Clinical Impression  Pt received in bed on 1L/min supplemental oxygen via nasal cannula. Pt agreeable to tx, but not oriented to situation. Pt requires mod assist for supine>sit with HOB elevated, bed rails as pt with difficulty uprighting trunk but once upright pt able to statically sit with close supervision & BUE support. Pt coughing during transitional movement (appears to be coughing on his own oral secretions) then reports he cannot catch his breath. PT finally able to obtain a reading & SpO2 - 67%, HR = 137 bpm. Nurse notified & immediately came to room, PT increased oxygen to 4L/min. HR = 115 bpm & SpO2 = 82. Pt requires mod assist +2 for sit>supine and min assist to roll to allow NT to perform peri hygiene. Pt requires max cuing for pursed lip breathing & pt reports feeling better once back in bed & nurse increased pt to 6L/min. Pt left in handoff to nursing staff..  Pt would benefit from acute PT services to progress transfers & gait as able.     Follow Up Recommendations SNF;Supervision/Assistance - 24 hour    Equipment Recommendations   (TBD in next venue)    Recommendations for Other Services       Precautions / Restrictions Precautions Precautions: Fall Precaution Comments: aspiration, monitor O2 Restrictions Weight Bearing Restrictions: No      Mobility  Bed Mobility Overal bed mobility: Needs  Assistance Bed Mobility: Supine to Sit;Sit to Supine     Supine to sit: Mod assist;HOB elevated Sit to supine: Mod assist;+2 for safety/equipment   General bed mobility comments: assistance to upright trunk to sit EOB    Transfers                    Ambulation/Gait                Stairs            Wheelchair Mobility    Modified Rankin (Stroke Patients Only)       Balance Overall balance assessment: Needs assistance Sitting-balance support: Bilateral upper extremity supported;Feet supported Sitting balance-Leahy Scale: Good Sitting balance - Comments: close supervision static sitting balance EOB                                     Pertinent Vitals/Pain Pain Assessment: Faces Faces Pain Scale: No hurt    Home Living Family/patient expects to be discharged to:: Skilled nursing facility                 Additional Comments: Pt from [facility], per chart pt plans for LTC after rehab    Prior Function Level of Independence: Needs assistance   Gait / Transfers Assistance Needed: Pt reports working c PT on walking   ADL's / Homemaking Assistance Needed: Pt reports assist for ADLs  Hand Dominance   Dominant Hand: Right    Extremity/Trunk Assessment   Upper Extremity Assessment Upper Extremity Assessment: Generalized weakness    Lower Extremity Assessment Lower Extremity Assessment: Generalized weakness    Cervical / Trunk Assessment Cervical / Trunk Assessment: Kyphotic  Communication   Communication: HOH;Expressive difficulties  Cognition Arousal/Alertness: Awake/alert   Overall Cognitive Status: No family/caregiver present to determine baseline cognitive functioning Area of Impairment: Orientation;Following commands;Safety/judgement;Problem solving;Memory;Attention;Awareness                 Orientation Level: Disoriented to;Place;Time;Situation (Pt reports he's in rehabilitation & when given  choice of hospital vs hotel pt reports hotel. Pt reports the year is [place] but accurately reports current month is November. Pt talking about son/car parking issue throughout session.)   Memory: Decreased short-term memory Following Commands: Follows one step commands with increased time Safety/Judgement: Decreased awareness of safety;Decreased awareness of deficits Awareness: Intellectual Problem Solving: Slow processing;Decreased initiation;Requires verbal cues;Requires tactile cues        General Comments General comments (skin integrity, edema, etc.): Pt breathes through mouth, requires cuing for pursed lip breathing.    Exercises     Assessment/Plan    PT Assessment Patient needs continued PT services  PT Problem List Decreased strength;Decreased mobility;Decreased safety awareness;Decreased coordination;Decreased activity tolerance;Decreased cognition;Cardiopulmonary status limiting activity;Decreased knowledge of use of DME;Decreased balance       PT Treatment Interventions DME instruction;Therapeutic exercise;Wheelchair mobility training;Manual techniques;Balance training;Gait training;Stair training;Neuromuscular re-education;Cognitive remediation;Functional mobility training;Therapeutic activities;Patient/family education    PT Goals (Current goals can be found in the Care Plan section)  Acute Rehab PT Goals Patient Stated Goal: none stated PT Goal Formulation: Patient unable to participate in goal setting Time For Goal Achievement: 01/11/20 Potential to Achieve Goals: Fair    Frequency Min 2X/week   Barriers to discharge Decreased caregiver support      Co-evaluation               AM-PAC PT "6 Clicks" Mobility  Outcome Measure Help needed turning from your back to your side while in a flat bed without using bedrails?: A Lot Help needed moving from lying on your back to sitting on the side of a flat bed without using bedrails?: A Lot Help needed moving to  and from a bed to a chair (including a wheelchair)?: A Lot Help needed standing up from a chair using your arms (e.g., wheelchair or bedside chair)?: A Lot Help needed to walk in hospital room?: Total Help needed climbing 3-5 steps with a railing? : Total 6 Click Score: 10    End of Session   Activity Tolerance:  (limited by low O2) Patient left: in bed;with nursing/sitter in room Nurse Communication:  (O2 during session) PT Visit Diagnosis: Difficulty in walking, not elsewhere classified (R26.2);Muscle weakness (generalized) (M62.81)    Time: 6045-4098 PT Time Calculation (min) (ACUTE ONLY): 25 min   Charges:   PT Evaluation $PT Eval Moderate Complexity: 1 Mod PT Treatments $Therapeutic Activity: 8-22 mins        Lavone Nian, PT, DPT 12/28/19, 1:26 PM   Waunita Schooner 12/28/2019, 1:22 PM

## 2019-12-28 NOTE — Progress Notes (Signed)
PROGRESS NOTE    [name] [name]  TGG:[id number] DOB: 01-18-1941 DOA: 12/26/2019 PCP: [name], [name], MD   Assessment & Plan:   Principal Problem:   Lobar pneumonia [facility]) Active Problems:   Spinal stenosis, lumbar region, with neurogenic claudication   Chronic pain disorder   AKI (acute kidney injury) ([place])   CVA (cerebral vascular accident) ([place])   Acute hypoxemic respiratory failure ([place])   Dysphagia   Acute encephalopathy    Right middle and lower lobar pneumonia: with associated small parapneumonic effusion. Continue on IV ceftriaxone, azithromycin. Continue on bronchodilators and encourage incentive spirometry. Legionella, strep pneumoniae ordered  Acute hypoxic respiratory failure: secondary to above. Continue on supplemental oxygen and wean as tolerated.    Acute metabolic encephalopathy: likely secondary to infection vs worsening dementia. Continue on IV abxs and continue w/ supportive care   Leukocytosis: likely secondary to infection. Continue on IV abxs   Likely AKI on CKD stage II: likely secondary to poor oral intake, lasix & losartan. Cr is trending down from day prior. Will continue to monitor   Likely ACD: H&H are stable. Will transfuse if Hb <7.0.   Hyperkalemia: resolved   Hx of CVA w/ residual dysarthria: continue on aspirin, statin and plavix   Dysphagia: likely secondary to recent CVA. Continue dysphagia II diet as per speech   Chronic pain disorder: of neck & back. W/ significant pain today. Tylenol prn.   Mild cognitive impairment: as per speech therapy documentation. Hx of dementia as per pt's sister  Moderate protein calorie malnutrition: continue w/ nutritional supplements.    DVT prophylaxis: lovenox Code Status: full Family Communication: discussed pt's care w/ pt's sister again today, Mary, via phone and answered her questions  Disposition Plan: likely d/c to SNF   Status is: Inpatient  Remains inpatient appropriate  because:Altered mental status and IV treatments appropriate due to intensity of illness or inability to take PO   Dispo: The patient is from: Home              Anticipated d/c is to: SNF              Anticipated d/c date is: 3 days              Patient currently is not medically stable to d/c.       Consultants:      Procedures:   Antimicrobials: azithromycin, ceftriaxone    Subjective: Pt c/o back pain.     Objective: Vitals:   12/27/19 1603 12/27/19 2026 12/27/19 2320 12/28/19 0414  BP: (!) 129/58 (!) 170/96 105/65 116/68  Pulse: 84 97 73 68  Resp: 20 20 16 15   Temp: 99.1 F (37.3 C) 99.7 F (37.6 C) 97.8 F (36.6 C) 97.7 F (36.5 C)  TempSrc: Oral Oral  Oral  SpO2: 99% 96% 100% 98%  Weight:      Height:        Intake/Output Summary (Last 24 hours) at 12/28/2019 0730 Last data filed at 12/28/2019 0600 Gross per 24 hour  Intake 905.6 ml  Output 900 ml  Net 5.6 ml   Filed Weights   12/26/19 1500  Weight: 66.8 kg    Examination:  General exam: Appears uncomfortable. Frail appearing   Respiratory system: decreased breath sounds b/l. No wheezes Cardiovascular system: S1 & S2+. No rubs or gallops  Gastrointestinal system: Abdomen is nondistended, soft and nontender.  Normal bowel sounds heard. Central nervous system: Oriented to person only  Psychiatry: judgement and  insight appear abnormal. Flat mood and affect     Data Reviewed: I have personally reviewed following labs and imaging studies  CBC: Recent Labs  Lab 12/26/19 1510 12/27/19 0533 12/28/19 0620  WBC 23.0* 18.5* 16.4*  NEUTROABS 19.7*  --   --   HGB 12.1* 10.2* 10.0*  HCT 39.0 32.6* 31.3*  MCV 83.5 84.0 81.9  PLT 334 280 654   Basic Metabolic Panel: Recent Labs  Lab 12/26/19 1510 12/27/19 0533 12/28/19 0620  NA 135 137 137  K 5.4* 4.6 4.3  CL 98 104 105  CO2 27 25 23   GLUCOSE 130* 106* 118*  BUN 30* 36* 31*  CREATININE 1.32* 1.38* 1.09  CALCIUM 8.7* 8.4* 8.2*    GFR: Estimated Creatinine Clearance: 52.8 mL/min (by C-G formula based on SCr of 1.09 mg/dL). Liver Function Tests: Recent Labs  Lab 12/26/19 1510  AST 23  ALT 18  ALKPHOS 85  BILITOT 0.9  PROT 8.0  ALBUMIN 3.2*   No results for input(s): LIPASE, AMYLASE in the last 168 hours. No results for input(s): AMMONIA in the last 168 hours. Coagulation Profile: No results for input(s): INR, PROTIME in the last 168 hours. Cardiac Enzymes: No results for input(s): CKTOTAL, CKMB, CKMBINDEX, TROPONINI in the last 168 hours. BNP (last 3 results) No results for input(s): PROBNP in the last 8760 hours. HbA1C: No results for input(s): HGBA1C in the last 72 hours. CBG: No results for input(s): GLUCAP in the last 168 hours. Lipid Profile: No results for input(s): CHOL, HDL, LDLCALC, TRIG, CHOLHDL, LDLDIRECT in the last 72 hours. Thyroid Function Tests: No results for input(s): TSH, T4TOTAL, FREET4, T3FREE, THYROIDAB in the last 72 hours. Anemia Panel: No results for input(s): VITAMINB12, FOLATE, FERRITIN, TIBC, IRON, RETICCTPCT in the last 72 hours. Sepsis Labs: Recent Labs  Lab 12/26/19 1510 12/26/19 1708  LATICACIDVEN 1.4 1.4    Recent Results (from the past 240 hour(s))  Resp Panel by RT-PCR (Flu A&B, Covid) Nasopharyngeal Swab     Status: None   Collection Time: 12/26/19  3:10 PM   Specimen: Nasopharyngeal Swab; Nasopharyngeal(NP) swabs in vial transport medium  Result Value Ref Range Status   SARS Coronavirus 2 by RT PCR NEGATIVE NEGATIVE Final    Comment: (NOTE) SARS-CoV-2 target nucleic acids are NOT DETECTED.  The SARS-CoV-2 RNA is generally detectable in upper respiratory specimens during the acute phase of infection. The lowest concentration of SARS-CoV-2 viral copies this assay can detect is 138 copies/mL. A negative result does not preclude SARS-Cov-2 infection and should not be used as the sole basis for treatment or other patient management decisions. A negative  result may occur with  improper specimen collection/handling, submission of specimen other than nasopharyngeal swab, presence of viral mutation(s) within the areas targeted by this assay, and inadequate number of viral copies(<138 copies/mL). A negative result must be combined with clinical observations, patient history, and epidemiological information. The expected result is Negative.  Fact Sheet for Patients:  EntrepreneurPulse.com.au  Fact Sheet for Healthcare Providers:  IncredibleEmployment.be  This test is no t yet approved or cleared by the [place] FDA and  has been authorized for detection and/or diagnosis of SARS-CoV-2 by FDA under an Emergency Use Authorization (EUA). This EUA will remain  in effect (meaning this test can be used) for the duration of the COVID-19 declaration under Section 564(b)(1) of the Act, 21 U.S.C.section 360bbb-3(b)(1), unless the authorization is terminated  or revoked sooner.       Influenza A by  PCR NEGATIVE NEGATIVE Final   Influenza B by PCR NEGATIVE NEGATIVE Final    Comment: (NOTE) The Xpert Xpress SARS-CoV-2/FLU/RSV plus assay is intended as an aid in the diagnosis of influenza from Nasopharyngeal swab specimens and should not be used as a sole basis for treatment. Nasal washings and aspirates are unacceptable for Xpert Xpress SARS-CoV-2/FLU/RSV testing.  Fact Sheet for Patients: EntrepreneurPulse.com.au  Fact Sheet for Healthcare Providers: IncredibleEmployment.be  This test is not yet approved or cleared by the [place] FDA and has been authorized for detection and/or diagnosis of SARS-CoV-2 by FDA under an Emergency Use Authorization (EUA). This EUA will remain in effect (meaning this test can be used) for the duration of the COVID-19 declaration under Section 564(b)(1) of the Act, 21 U.S.C. section 360bbb-3(b)(1), unless the authorization is  terminated or revoked.  Performed at [facility], [address] [place], [place] [address]   Culture, blood (routine x 2)     Status: None (Preliminary result)   Collection Time: 12/26/19  3:10 PM   Specimen: BLOOD  Result Value Ref Range Status   Specimen Description BLOOD RIGHT ARM  Final   Special Requests   Final    BOTTLES DRAWN AEROBIC AND ANAEROBIC Blood Culture results may not be optimal due to an excessive volume of blood received in culture bottles   Culture   Final    NO GROWTH 2 DAYS Performed at [facility], [address] [place], [place] [address]    Report Status PENDING  Incomplete  Culture, blood (routine x 2)     Status: None (Preliminary result)   Collection Time: 12/26/19  3:11 PM   Specimen: BLOOD  Result Value Ref Range Status   Specimen Description BLOOD LEFT FORE ARM  Final   Special Requests   Final    BOTTLES DRAWN AEROBIC AND ANAEROBIC Blood Culture adequate volume   Culture   Final    NO GROWTH 2 DAYS Performed at [facility], [address] [place],  [address]    Report Status PENDING  Incomplete         Radiology Studies: DG Chest Portable 1 View  Result Date: 12/26/2019 CLINICAL DATA:  Possible pneumonia, altered mental status EXAM: PORTABLE CHEST 1 VIEW COMPARISON:  Portable exam 1518 hours compared to 11/01/2019 FINDINGS: Normal heart size, mediastinal contours, and pulmonary vascularity. Atherosclerotic calcification aorta. New RIGHT basilar infiltrate consistent with pneumonia likely involving RIGHT middle and RIGHT lower lobes. Small parapneumonic effusion at RIGHT base. Minimal atelectasis or infiltrate at medial LEFT base. Remaining lungs clear. No pneumothorax or acute osseous findings. IMPRESSION: New RIGHT basilar infiltrate consistent with pneumonia. Small parapneumonic RIGHT pleural effusion. Question minimal atelectasis or infiltrate at medial LEFT base. Electronically Signed   By: Lavonia Dana M.D.   On: 12/26/2019 15:40        Scheduled Meds: . aspirin EC  81 mg Oral Daily  . clopidogrel  75 mg Oral Daily  . enoxaparin (LOVENOX) injection  40 mg Subcutaneous Q24H  . polyethylene glycol  34 g Oral BID  . polyvinyl alcohol  1 drop Both Eyes BID  . rosuvastatin  10 mg Oral Daily  . sodium chloride flush  3 mL Intravenous Q12H   Continuous Infusions: . sodium chloride 10 mL/hr at 12/28/19 0600  . azithromycin Stopped (12/27/19 2158)  . cefTRIAXone (ROCEPHIN)  IV Stopped (12/28/19 0549)     LOS: 2 days    Time spent: 30 mins  Wyvonnia Dusky, MD [facility] Pager [phone]  If 7PM-7AM, please contact night-coverage 12/28/2019, 7:30 AM

## 2019-12-28 NOTE — TOC Progression Note (Signed)
Transition of Care [facility]) - Progression Note    Patient Details  Name: Hashem Goynes MRN: [id number] Date of Birth: 1940-10-03  Transition of Care [facility]) CM/SW [place], [place] Phone Number: 12/28/2019, 10:19 AM  Clinical Narrative:     CSW completed FL2 and got doctor to sign FL2. CSW faxed to [facility] the [place], speech and OT evaluation. CSW sent through in basket, as well. CSW attempted to call [facility] but they did not answer. CSW will follow back up on Sunday.   Expected Discharge Plan: Skilled Nursing Facility Barriers to Discharge: Ship broker, Continued Medical Work up  Expected Discharge Plan and Services Expected Discharge Plan: [place] Acute Care Choice: Resumption of Svcs/PTA Provider Living arrangements for the past 2 months: [place]                                       Social Determinants of Health (SDOH) Interventions    Readmission Risk Interventions No flowsheet data found.

## 2019-12-28 NOTE — NC FL2 (Signed)
[place] LEVEL OF CARE SCREENING TOOL     IDENTIFICATION  Patient Name: [name] [name] Birthdate: 02-Nov-1940 Sex: male Admission Date (Current Location): 12/26/2019  [place] and [place] Number:  Engineering geologist and Address:  [facility], [address], [place], [place] [address]      Provider Number: [id number]  Attending Physician Name and Address:  Wyvonnia Dusky, MD  Relative Name and Phone Number:  Patient's sister, Hermes Wafer [phone]    Current Level of Care: Hospital Recommended Level of Care: [place] Prior Approval Number:    Date Approved/Denied: 09/20/19 PASRR Number: [id number]  Discharge Plan: SNF    Current Diagnoses: Patient Active Problem List   Diagnosis Date Noted  . Malnutrition of moderate degree 12/28/2019  . Lobar pneumonia ([place]) 12/26/2019  . Acute hypoxemic respiratory failure ([place]) 12/26/2019  . Dysphagia 12/26/2019  . Acute encephalopathy 12/26/2019  . CVA (cerebral vascular accident) ([place]) 11/03/2019  . AMS (altered mental status) 11/02/2019  . Acute lower UTI 11/02/2019  . Weakness 09/20/2019  . Protein-calorie malnutrition, severe 09/20/2019  . AKI (acute kidney injury) ([place]) 09/19/2019  . Ambulatory dysfunction 09/19/2019  . Dehydration 09/19/2019  . Fall at home, initial encounter 09/19/2019  . Aortic atherosclerosis ([place]) 02/16/2019  . Hypertension, essential, benign 08/13/2018  . Urinary frequency 02/05/2018  . PAD (peripheral artery disease) (H[facility])-Mild 12/26/2017  . Chronic pain disorder 11/17/2017  . Unstable gait 11/17/2017  . BPH associated with nocturia 09/26/2017  . Allergic rhinitis 06/09/2017  . Tobacco use disorder 02/14/2017  . Generalized osteoarthritis of multiple sites 02/14/2017  . Bilateral lower extremity edema 08/12/2016  . Chronic right-sided low back pain with right-sided sciatica 03/14/2016  . Chronic pain of right knee  03/14/2016  . DDD (degenerative disc disease), lumbar 07/07/2014  . DDD (degenerative disc disease), cervical 07/07/2014  . DJD (degenerative joint disease) of knee 07/07/2014  . Bilateral occipital neuralgia 07/07/2014  . Spinal stenosis, lumbar region, with neurogenic claudication 07/07/2014    Orientation RESPIRATION BLADDER Height & Weight     Self, Time, Situation, Place  O2 Continent Weight: 147 lb 4.3 oz (66.8 kg) Height:  5\' 10"  (177.8 cm)  BEHAVIORAL SYMPTOMS/MOOD NEUROLOGICAL BOWEL NUTRITION STATUS      Continent Diet (THIN)  AMBULATORY STATUS COMMUNICATION OF NEEDS Skin   Extensive Assist Verbally Normal                       Personal Care Assistance Level of Assistance  Bathing, Feeding, Dressing Bathing Assistance: Maximum assistance Feeding assistance: Maximum assistance Dressing Assistance: Maximum assistance     Functional Limitations Info             SPECIAL CARE FACTORS FREQUENCY                       Contractures      Additional Factors Info  Code Status Code Status Info: FULL             Current Medications (12/28/2019):  This is the current hospital active medication list Current Facility-Administered Medications  Medication Dose Route Frequency Provider Last Rate Last Admin  . 0.9 %  sodium chloride infusion   Intravenous PRN Wyvonnia Dusky, MD 10 mL/hr at 12/28/19 0600 Rate Verify at 12/28/19 0600  . acetaminophen (TYLENOL) tablet 650 mg  650 mg Oral Q6H PRN Samuella Cota, MD   650 mg at 12/27/19 2023  Or  . acetaminophen (TYLENOL) suppository 650 mg  650 mg Rectal Q6H PRN Samuella Cota, MD      . aspirin EC tablet 81 mg  81 mg Oral Daily Samuella Cota, MD   81 mg at 12/27/19 1031  . azithromycin (ZITHROMAX) 500 mg in sodium chloride 0.9 % 250 mL IVPB  500 mg Intravenous Q24H Samuella Cota, MD   Stopped at 12/27/19 2158  . cefTRIAXone (ROCEPHIN) 2 g in sodium chloride 0.9 % 100 mL IVPB  2 g  Intravenous Q24H Samuella Cota, MD   Stopped at 12/28/19 [phone]  . clopidogrel (PLAVIX) tablet 75 mg  75 mg Oral Daily Samuella Cota, MD   75 mg at 12/27/19 1030  . enoxaparin (LOVENOX) injection 40 mg  40 mg Subcutaneous Q24H Samuella Cota, MD   40 mg at 12/27/19 2059  . guaiFENesin-dextromethorphan (ROBITUSSIN DM) 100-10 MG/5ML syrup 5 mL  5 mL Oral Q4H PRN Wyvonnia Dusky, MD   5 mL at 12/28/19 0159  . ipratropium-albuterol (DUONEB) 0.5-2.5 (3) MG/3ML nebulizer solution 3 mL  3 mL Nebulization Q6H PRN Wyvonnia Dusky, MD   3 mL at 12/27/19 1149  . lidocaine (LIDODERM) 5 % 1 patch  1 patch Transdermal Q24H Wyvonnia Dusky, MD      . ondansetron [facility]) tablet 4 mg  4 mg Oral Q6H PRN Samuella Cota, MD       Or  . ondansetron [facility]) injection 4 mg  4 mg Intravenous Q6H PRN Samuella Cota, MD      . polyethylene glycol (MIRALAX / GLYCOLAX) packet 34 g  34 g Oral BID Samuella Cota, MD   34 g at 12/27/19 1031  . polyvinyl alcohol (LIQUIFILM TEARS) 1.4 % ophthalmic solution 1 drop  1 drop Both Eyes BID Samuella Cota, MD   1 drop at 12/27/19 2100  . rosuvastatin (CRESTOR) tablet 10 mg  10 mg Oral Daily Samuella Cota, MD   10 mg at 12/27/19 1035  . sodium chloride flush (NS) 0.9 % injection 3 mL  3 mL Intravenous Q12H Samuella Cota, MD   3 mL at 12/27/19 2101     Discharge Medications: Please see discharge summary for a list of discharge medications.  Relevant Imaging Results:  Relevant Lab Results:   Additional Information [place], [place]

## 2019-12-29 LAB — BASIC METABOLIC PANEL
Anion gap: 10 (ref 5–15)
BUN: 27 mg/dL — ABNORMAL HIGH (ref 8–23)
CO2: 24 mmol/L (ref 22–32)
Calcium: 8.4 mg/dL — ABNORMAL LOW (ref 8.9–10.3)
Chloride: 103 mmol/L (ref 98–111)
Creatinine, Ser: 1.05 mg/dL (ref 0.61–1.24)
GFR, Estimated: >60 mL/min (ref >60)
Glucose, Bld: 118 mg/dL — ABNORMAL HIGH (ref 70–99)
Potassium: 4.6 mmol/L (ref 3.5–5.1)
Sodium: 137 mmol/L (ref 135–145)

## 2019-12-29 LAB — URINALYSIS, COMPLETE (UACMP) WITH MICROSCOPIC
Bilirubin Urine: NEGATIVE
Glucose, UA: NEGATIVE mg/dL
Hgb urine dipstick: NEGATIVE
Ketones, ur: NEGATIVE mg/dL
Nitrite: NEGATIVE
Protein, ur: NEGATIVE mg/dL
Specific Gravity, Urine: 1.019 (ref 1.005–1.030)
pH: 5 (ref 5.0–8.0)

## 2019-12-29 LAB — CBC
HCT: 35.5 % — ABNORMAL LOW (ref 39.0–52.0)
Hemoglobin: 11.4 g/dL — ABNORMAL LOW (ref 13.0–17.0)
MCH: 26.1 pg (ref 26.0–34.0)
MCHC: 32.1 g/dL (ref 30.0–36.0)
MCV: 81.2 fL (ref 80.0–100.0)
Platelets: 349 10*3/uL (ref 150–400)
RBC: 4.37 MIL/uL (ref 4.22–5.81)
RDW: 13.2 % (ref 11.5–15.5)
WBC: 18.4 10*3/uL — ABNORMAL HIGH (ref 4.0–10.5)
nRBC: 0 % (ref 0.0–0.2)

## 2019-12-29 LAB — STREP PNEUMONIAE URINARY ANTIGEN: Strep Pneumo Urinary Antigen: NEGATIVE

## 2019-12-29 NOTE — Progress Notes (Signed)
PROGRESS NOTE    [name] [name]  MVH:[id number] DOB: 08/14/1940 DOA: 12/26/2019 PCP: [name], [name], MD   Assessment & Plan:   Principal Problem:   Lobar pneumonia [facility] Active Problems:   Spinal stenosis, lumbar region, with neurogenic claudication   Chronic pain disorder   AKI (acute kidney injury) ([place])   CVA (cerebral vascular accident) ([place])   Acute hypoxemic respiratory failure ([place])   Dysphagia   Acute encephalopathy   Malnutrition of moderate degree    Right middle and lower lobar pneumonia: w/ assoc. Small parapneumonic effusion. Continue on IV ceftriaxone, azithromycin. Continue on bronchodilators and encourage incentive spirometry. Legionella, strep ordered but not collected yet, discussed w/ pt's nurse   Acute hypoxic respiratory failure: secondary to above. Continue on supplemental oxygen and wean as tolerated.   Acute metabolic encephalopathy: likely secondary to infection vs worsening dementia. Oriented to person, place today, improved from day prior. Continue w/ supportive care   Leukocytosis: likely secondary to infection. Continue on IV abxs   Likely AKI on CKD stage II: likely secondary to poor oral intake, losartan & lasix. Cr continues to trend down daily   Likely ACD: H&H are stable.   Hyperkalemia: resolved   Hx of CVA w/ residual dysarthria: continue on aspirin, plavix & statin   Dysphagia: likely secondary to recent CVA. Continue dysphagia II diet as per speech   Chronic pain disorder: of neck & back. W/ significant pain today. Tylenol prn.   Mild cognitive impairment: as per speech therapy documentation. Hx of dementia as per pt's sister  Moderate protein calorie malnutrition: continue w/ nutritional supplements.    DVT prophylaxis: lovenox Code Status: full Family Communication: discussed pt's care w/ pt's sister again today, Mary, via phone and answered her questions  Disposition Plan: likely d/c to SNF   Status is:  Inpatient  Remains inpatient appropriate because:Altered mental status and IV treatments appropriate due to intensity of illness or inability to take PO   Dispo: The patient is from: Home              Anticipated d/c is to: SNF              Anticipated d/c date is: 2 days               Patient currently is not medically stable to d/c.       Consultants:      Procedures:   Antimicrobials: azithromycin, ceftriaxone    Subjective: Pt c/o fatigue    Objective: Vitals:   12/28/19 1118 12/28/19 2008 12/28/19 2314 12/29/19 0538  BP:  (!) 148/75 140/72 (!) 150/92  Pulse:  87 86 100  Resp:  18 18 20   Temp:  98.9 F (37.2 C) 98.6 F (37 C) 98.6 F (37 C)  TempSrc:   Oral Oral  SpO2: 100% 97% 96% 96%  Weight:      Height:        Intake/Output Summary (Last 24 hours) at 12/29/2019 0724 Last data filed at 12/29/2019 0700 Gross per 24 hour  Intake 1400 ml  Output 1100 ml  Net 300 ml   Filed Weights   12/26/19 1500  Weight: 66.8 kg    Examination:  General exam: Appears calm & comfortable   Respiratory system: diminished breath sounds b/l. No rales  Cardiovascular system: S1/S2+. No clicks, rubs or gallops Gastrointestinal system: Abd is soft, non-tender, non-distended & normal bowel sounds Central nervous system: Oriented to person and place only  Psychiatry:  judgement and insight appear abnormal. Flat mood and affect     Data Reviewed: I have personally reviewed following labs and imaging studies  CBC: Recent Labs  Lab 12/26/19 1510 12/27/19 0533 12/28/19 0620 12/29/19 0348  WBC 23.0* 18.5* 16.4* 18.4*  NEUTROABS 19.7*  --   --   --   HGB 12.1* 10.2* 10.0* 11.4*  HCT 39.0 32.6* 31.3* 35.5*  MCV 83.5 84.0 81.9 81.2  PLT 334 280 317 202   Basic Metabolic Panel: Recent Labs  Lab 12/26/19 1510 12/27/19 0533 12/28/19 0620 12/29/19 0348  NA 135 137 137 137  K 5.4* 4.6 4.3 4.6  CL 98 104 105 103  CO2 27 25 23 24   GLUCOSE 130* 106* 118* 118*   BUN 30* 36* 31* 27*  CREATININE 1.32* 1.38* 1.09 1.05  CALCIUM 8.7* 8.4* 8.2* 8.4*   GFR: Estimated Creatinine Clearance: 54.8 mL/min (by C-G formula based on SCr of 1.05 mg/dL). Liver Function Tests: Recent Labs  Lab 12/26/19 1510  AST 23  ALT 18  ALKPHOS 85  BILITOT 0.9  PROT 8.0  ALBUMIN 3.2*   No results for input(s): LIPASE, AMYLASE in the last 168 hours. No results for input(s): AMMONIA in the last 168 hours. Coagulation Profile: No results for input(s): INR, PROTIME in the last 168 hours. Cardiac Enzymes: No results for input(s): CKTOTAL, CKMB, CKMBINDEX, TROPONINI in the last 168 hours. BNP (last 3 results) No results for input(s): PROBNP in the last 8760 hours. HbA1C: No results for input(s): HGBA1C in the last 72 hours. CBG: No results for input(s): GLUCAP in the last 168 hours. Lipid Profile: No results for input(s): CHOL, HDL, LDLCALC, TRIG, CHOLHDL, LDLDIRECT in the last 72 hours. Thyroid Function Tests: No results for input(s): TSH, T4TOTAL, FREET4, T3FREE, THYROIDAB in the last 72 hours. Anemia Panel: No results for input(s): VITAMINB12, FOLATE, FERRITIN, TIBC, IRON, RETICCTPCT in the last 72 hours. Sepsis Labs: Recent Labs  Lab 12/26/19 1510 12/26/19 1708  LATICACIDVEN 1.4 1.4    Recent Results (from the past 240 hour(s))  Resp Panel by RT-PCR (Flu A&B, Covid) Nasopharyngeal Swab     Status: None   Collection Time: 12/26/19  3:10 PM   Specimen: Nasopharyngeal Swab; Nasopharyngeal(NP) swabs in vial transport medium  Result Value Ref Range Status   SARS Coronavirus 2 by RT PCR NEGATIVE NEGATIVE Final    Comment: (NOTE) SARS-CoV-2 target nucleic acids are NOT DETECTED.  The SARS-CoV-2 RNA is generally detectable in upper respiratory specimens during the acute phase of infection. The lowest concentration of SARS-CoV-2 viral copies this assay can detect is 138 copies/mL. A negative result does not preclude SARS-Cov-2 infection and should not be  used as the sole basis for treatment or other patient management decisions. A negative result may occur with  improper specimen collection/handling, submission of specimen other than nasopharyngeal swab, presence of viral mutation(s) within the areas targeted by this assay, and inadequate number of viral copies(<138 copies/mL). A negative result must be combined with clinical observations, patient history, and epidemiological information. The expected result is Negative.  Fact Sheet for Patients:  EntrepreneurPulse.com.au  Fact Sheet for Healthcare Providers:  IncredibleEmployment.be  This test is no t yet approved or cleared by the [place] FDA and  has been authorized for detection and/or diagnosis of SARS-CoV-2 by FDA under an Emergency Use Authorization (EUA). This EUA will remain  in effect (meaning this test can be used) for the duration of the COVID-19 declaration under Section 564(b)(1) of the  Act, 21 U.S.C.section 360bbb-3(b)(1), unless the authorization is terminated  or revoked sooner.       Influenza A by PCR NEGATIVE NEGATIVE Final   Influenza B by PCR NEGATIVE NEGATIVE Final    Comment: (NOTE) The Xpert Xpress SARS-CoV-2/FLU/RSV plus assay is intended as an aid in the diagnosis of influenza from Nasopharyngeal swab specimens and should not be used as a sole basis for treatment. Nasal washings and aspirates are unacceptable for Xpert Xpress SARS-CoV-2/FLU/RSV testing.  Fact Sheet for Patients: EntrepreneurPulse.com.au  Fact Sheet for Healthcare Providers: IncredibleEmployment.be  This test is not yet approved or cleared by the [place] FDA and has been authorized for detection and/or diagnosis of SARS-CoV-2 by FDA under an Emergency Use Authorization (EUA). This EUA will remain in effect (meaning this test can be used) for the duration of the COVID-19 declaration under Section  564(b)(1) of the Act, 21 U.S.C. section 360bbb-3(b)(1), unless the authorization is terminated or revoked.  Performed at [facility], [address] [place], [place] [address]   Culture, blood (routine x 2)     Status: None (Preliminary result)   Collection Time: 12/26/19  3:10 PM   Specimen: BLOOD  Result Value Ref Range Status   Specimen Description BLOOD RIGHT ARM  Final   Special Requests   Final    BOTTLES DRAWN AEROBIC AND ANAEROBIC Blood Culture results may not be optimal due to an excessive volume of blood received in culture bottles   Culture   Final    NO GROWTH 3 DAYS Performed at [facility], [address] [place], [place] [address]    Report Status PENDING  Incomplete  Culture, blood (routine x 2)     Status: None (Preliminary result)   Collection Time: 12/26/19  3:11 PM   Specimen: BLOOD  Result Value Ref Range Status   Specimen Description BLOOD LEFT FORE ARM  Final   Special Requests   Final    BOTTLES DRAWN AEROBIC AND ANAEROBIC Blood Culture adequate volume   Culture   Final    NO GROWTH 3 DAYS Performed at [facility], [address] [place], [place] [address]    Report Status PENDING  Incomplete         Radiology Studies: No results found.      Scheduled Meds: . aspirin EC  81 mg Oral Daily  . clopidogrel  75 mg Oral Daily  . enoxaparin (LOVENOX) injection  40 mg Subcutaneous Q24H  . lidocaine  1 patch Transdermal Q24H  . polyethylene glycol  34 g Oral BID  . polyvinyl alcohol  1 drop Both Eyes BID  . rosuvastatin  10 mg Oral Daily  . sodium chloride flush  3 mL Intravenous Q12H   Continuous Infusions: . sodium chloride 10 mL/hr at 12/28/19 0600  . azithromycin 500 mg (12/28/19 2024)  . cefTRIAXone (ROCEPHIN)  IV Stopped (12/28/19 0549)     LOS: 3 days    Time spent: 33 mins     Wyvonnia Dusky, MD [facility] Pager [phone]  If 7PM-7AM, please contact  night-coverage 12/29/2019, 7:24 AM

## 2019-12-29 NOTE — TOC Progression Note (Signed)
Transition of Care [facility]) - Progression Note    Patient Details  Name: [name] [name] MRN: [id number] Date of Birth: [birth year]  Transition of Care [facility]) CM/SW [place], [place] Phone Number: 12/29/2019, 11:46 AM  Clinical Narrative:     CSW received secure chat from patient's doctor that patient's sister wants to talk to [place].  CSW called patient's sister, [name]. Patient's sister asked about [facility] update and if additional referrals for SNF could be sent out to placements in [place]. Patient's sister lives off of [address] [place] and she would like him to be closer. Patient's sister states that she is fine with [facility], but wanted to extend the search.  CSW sent out to multiple SNF placements in [place] area for the patient.   Expected Discharge Plan: Skilled Nursing Facility Barriers to Discharge: Ship broker, Continued Medical Work up  Expected Discharge Plan and Services Expected Discharge Plan: [place] Acute Care Choice: Resumption of Svcs/PTA Provider Living arrangements for the past 2 months: [place]                                       Social Determinants of Health (SDOH) Interventions    Readmission Risk Interventions No flowsheet data found.

## 2019-12-29 NOTE — Progress Notes (Addendum)
Urine sample sent down to lab for Legionella and Strep,Urinary  Order. I called Lab to confirm if it is in process but the lab Staff  said, she is going to release the order so it can be processed.

## 2019-12-30 LAB — BASIC METABOLIC PANEL
Anion gap: 12 (ref 5–15)
BUN: 22 mg/dL (ref 8–23)
CO2: 23 mmol/L (ref 22–32)
Calcium: 8.4 mg/dL — ABNORMAL LOW (ref 8.9–10.3)
Chloride: 102 mmol/L (ref 98–111)
Creatinine, Ser: 0.99 mg/dL (ref 0.61–1.24)
GFR, Estimated: >60 mL/min (ref >60)
Glucose, Bld: 110 mg/dL — ABNORMAL HIGH (ref 70–99)
Potassium: 4.1 mmol/L (ref 3.5–5.1)
Sodium: 137 mmol/L (ref 135–145)

## 2019-12-30 LAB — CBC
HCT: 35.1 % — ABNORMAL LOW (ref 39.0–52.0)
Hemoglobin: 11 g/dL — ABNORMAL LOW (ref 13.0–17.0)
MCH: 25.8 pg — ABNORMAL LOW (ref 26.0–34.0)
MCHC: 31.3 g/dL (ref 30.0–36.0)
MCV: 82.4 fL (ref 80.0–100.0)
Platelets: 327 10*3/uL (ref 150–400)
RBC: 4.26 MIL/uL (ref 4.22–5.81)
RDW: 13.2 % (ref 11.5–15.5)
WBC: 15.4 10*3/uL — ABNORMAL HIGH (ref 4.0–10.5)
nRBC: 0 % (ref 0.0–0.2)

## 2019-12-30 MED ORDER — ENSURE ENLIVE PO LIQD
237.0000 mL | Freq: Two times a day (BID) | ORAL | Status: DC
  Administered 2019-12-30 – 2020-01-02 (×8): 237 mL via ORAL

## 2019-12-30 MED ORDER — AZITHROMYCIN 250 MG PO TABS
500.0000 mg | ORAL_TABLET | Freq: Once | ORAL | Status: AC
  Administered 2019-12-30: 500 mg via ORAL
  Filled 2019-12-30: qty 2

## 2019-12-30 NOTE — Progress Notes (Addendum)
1500 Patient weaned to room air earlier in shift however on recheck patient 85% on RA and Oxygen via nasal cannula was replaced with effective result.  92% on 2 liters.

## 2019-12-30 NOTE — Progress Notes (Signed)
Physical Therapy Treatment Patient Details Name: [name] [name] MRN: [id number] DOB: [birth year] Today's Date: 12/30/2019    History of Present Illness Pt is a 79 y/o M who presented to ED from [facility] with staff reporting AMS. It is unclear from EMS & nursing home paperwork what pt's baseline mental status is. Pt had recent dx of penumonia. Work up revealed R middle & lower lobe pneumonia & acute hypoxic respiratory failure. PMH: back pain, renal disorder, lumbar spinal stenosis with neurogenic claudication, lacunar infarct 11/2019 with residual dysarthria, HTN, HLD    PT Comments    Pt agreeable to tx. Pt on 2L/min supplemental oxygen via nasal cannula & SpO2 >90%. Pt requires mod assist for bed mobility with extra time to sequence movement. Pt stands with cuing for safe hand placement & has incontinent BM. PT provides total assist for peri hygiene & pt tolerates standing ~5 minutes. (SpO2 drops to 85% after standing & nurse turns pt up to 3L/min). Pt continues to have BM so assisted to [facility] via stand pivot with RW & min assist with assistance for RW management and cuing for sequencing turning & reaching back for armrests. After toileting pt stands ~3 minutes for additional peri hygiene then pt is able to turn to sit in recliner with ongoing max multimodal cuing to continue turning & reaching back.  Once in recliner pt performs BLE LAQ 1 set x 10 reps with PT providing assistance for full knee extension ROM. Pt left in recliner with BLE elevated, on 3L/min supplemental oxygen & SpO2 >90%. Reviewed use of call bell with pt. Will continue to follow pt acutely to progress gait with LRAD & address standing balance.   Follow Up Recommendations  SNF;Supervision/Assistance - 24 hour     Equipment Recommendations       Recommendations for Other Services       Precautions / Restrictions Precautions Precaution Comments: aspiration, monitor O2 Restrictions Weight Bearing Restrictions: No     Mobility  Bed Mobility Overal bed mobility: Needs Assistance Bed Mobility: Supine to Sit     Supine to sit: Mod assist;HOB elevated     General bed mobility comments: significant assistance to transfer BLE to EOB & upright trunk  Transfers Overall transfer level: Needs assistance Equipment used: Rolling walker (2 wheeled) Transfers: Sit to/from Stand Sit to Stand: Mod assist;Min assist (mod fade to min assist for sit>stand with max instructional cuing for hand placement on RW)            Ambulation/Gait                 Stairs             Wheelchair Mobility    Modified Rankin (Stroke Patients Only)       Balance Overall balance assessment: Needs assistance Sitting-balance support: Bilateral upper extremity supported;Feet supported Sitting balance-Leahy Scale: Fair Sitting balance - Comments: close supervision static sitting balance EOB, when performing LE exercises EOB pt with significant posterior lean   Standing balance support: Bilateral upper extremity supported Standing balance-Leahy Scale: Poor Standing balance comment: BUE support on RW with CGA for static standing balance                            Cognition Arousal/Alertness: Awake/alert   Overall Cognitive Status: No family/caregiver present to determine baseline cognitive functioning Area of Impairment: Orientation;Following commands;Safety/judgement;Problem solving;Memory;Attention;Awareness  Orientation Level: Time;Situation;Disoriented to (pt is oriented to location ("[place] [facility]"))   Memory: Decreased short-term memory Following Commands: Follows one step commands with increased time Safety/Judgement: Decreased awareness of safety;Decreased awareness of deficits Awareness: Intellectual Problem Solving: Slow processing;Decreased initiation;Requires verbal cues;Requires tactile cues        Exercises      General Comments General  comments (skin integrity, edema, etc.): Pt breathing through mouth during entire session with PT educating pt on pursed lip breathing. Pt unable to manage oral secretions with secretions dripping out of mouth throughout entire session.      Pertinent Vitals/Pain Pain Assessment: Faces Faces Pain Scale: No hurt    Home Living                      Prior Function            PT Goals (current goals can now be found in the care plan section) Acute Rehab PT Goals Patient Stated Goal: none stated PT Goal Formulation: Patient unable to participate in goal setting Time For Goal Achievement: 01/11/20 Potential to Achieve Goals: Fair Progress towards PT goals: Progressing toward goals    Frequency    Min 2X/week      PT Plan Current plan remains appropriate    Co-evaluation              AM-PAC PT "6 Clicks" Mobility   Outcome Measure  Help needed turning from your back to your side while in a flat bed without using bedrails?: A Lot Help needed moving from lying on your back to sitting on the side of a flat bed without using bedrails?: A Lot Help needed moving to and from a bed to a chair (including a wheelchair)?: A Lot Help needed standing up from a chair using your arms (e.g., wheelchair or bedside chair)?: A Lot Help needed to walk in hospital room?: A Lot Help needed climbing 3-5 steps with a railing? : Total 6 Click Score: 11    End of Session Equipment Utilized During Treatment: Gait belt;Oxygen Activity Tolerance: Patient tolerated treatment well Patient left: in chair;with call bell/phone within reach;with chair alarm set Nurse Communication: Mobility status (O2) PT Visit Diagnosis: Difficulty in walking, not elsewhere classified (R26.2);Muscle weakness (generalized) (M62.81)     Time: 2229-7989 PT Time Calculation (min) (ACUTE ONLY): 40 min  Charges:  $Therapeutic Activity: 38-52 mins                     Lavone Nian, PT, DPT 12/30/19, 12:22  PM    Waunita Schooner 12/30/2019, 12:19 PM

## 2019-12-30 NOTE — TOC Progression Note (Addendum)
Transition of Care [facility]) - Progression Note    Patient Details  Name: [name] [name] MRN: [id number] Date of Birth: [birth year]  Transition of Care [facility]) CM/SW [place], LCSW Phone Number: 12/30/2019, 12:53 PM  Clinical Narrative:  Patient has a bed offer from [facility]. CSW notified patient's sister. She wanted CSW to make sure patient is agreeable to a facility in [place]. CSW spoke with patient and he agreed, stating this would make things easier on his sister. CSW tried calling admissions coordinator. Voicemail is full. Will confirm they can transition to LTC after rehab before officially accepting offer.   2:01 pm: [facility] can accept patient and transition to LTC after rehab. Sister is aware and agreeable. Admissions coordinator will start insurance authorization.   Expected Discharge Plan: Skilled Nursing Facility Barriers to Discharge: Ship broker, Continued Medical Work up  Expected Discharge Plan and Services Expected Discharge Plan: [place] Acute Care Choice: Resumption of Svcs/PTA Provider Living arrangements for the past 2 months: [place]                                       Social Determinants of Health (SDOH) Interventions    Readmission Risk Interventions No flowsheet data found.

## 2019-12-30 NOTE — Care Management Important Message (Signed)
Important Message  Patient Details  Name: Regie Bunner MRN: [id number] Date of Birth: [birth year]   Medicare Important Message Given:  Yes     Dannette Barbara 12/30/2019, 12:14 PM

## 2019-12-30 NOTE — Progress Notes (Signed)
PHARMACIST - PHYSICIAN COMMUNICATION DR:   Jimmye Norman CONCERNING: Antibiotic IV to Oral Route Change Policy  RECOMMENDATION: This patient is receiving Azithromycin by the intravenous route.  Based on criteria approved by the Pharmacy and Therapeutics Committee, the antibiotic(s) is/are being converted to the equivalent oral dose form(s).   DESCRIPTION: These criteria include:  Patient being treated for a respiratory tract infection, urinary tract infection, cellulitis or clostridium difficile associated diarrhea if on metronidazole  The patient is not neutropenic and does not exhibit a GI malabsorption state  The patient is eating (either orally or via tube) and/or has been taking other orally administered medications for a least 24 hours  The patient is improving clinically and has a Tmax < 100.5  If you have questions about this conversion, please contact the [place], PharmD, BCPS Clinical Pharmacist 12/30/2019 1:26 PM

## 2019-12-30 NOTE — Progress Notes (Signed)
PROGRESS NOTE    [name] [name]  LYY:[id number] DOB: 05-09-1940 DOA: 12/26/2019 PCP: [name], [name], MD   Assessment & Plan:   Principal Problem:   Lobar pneumonia [facility]) Active Problems:   Spinal stenosis, lumbar region, with neurogenic claudication   Chronic pain disorder   AKI (acute kidney injury) ([place])   CVA (cerebral vascular accident) ([place])   Acute hypoxemic respiratory failure ([place])   Dysphagia   Acute encephalopathy   Malnutrition of moderate degree    Right middle and lower lobar pneumonia: w/ assoc. small parapneumonic effusion. Slowly improving. Continue on IV ceftriaxone, azithromycin. Continue on bronchodilators and encourage incentive spirometry. Legionella is pending. Strept is neg   Acute hypoxic respiratory failure: secondary to above. Weaned off of supplemental oxygen today  Acute metabolic encephalopathy: likely secondary to worsening dementia vs infection. Oriented to person only today. Continue w/ supportive care  Leukocytosis: trending down. Will continue on IV abxs   Likely AKI on CKD stage II: likely secondary to poor oral intake, losartan & lasix. Cr is back to baseline   Likely ACD: H&H are stable. No transfusions are needed currently   Hyperkalemia: resolved   Hx of CVA w/ residual dysarthria: continue on aspirin, statin, & plavix   Dysphagia: likely secondary to recent CVA. Continue dysphagia II diet as per speech   Chronic pain disorder: of neck and back. Improving. Tylenol prn   Mild cognitive impairment: as per speech therapy documentation. Hx of dementia as per pt's sister  Moderate protein calorie malnutrition: continue w/ nutritional supplements.    DVT prophylaxis: lovenox Code Status: full Family Communication: discussed pt's care w/ pt's sister again today, Mary, via phone and answered her questions  Disposition Plan: likely d/c to SNF   Status is: Inpatient  Remains inpatient appropriate because:Altered  mental status and IV treatments appropriate due to intensity of illness or inability to take PO, has bed offer [facility] and  they are still trying to speak w/ the admissions coordinator as per CM    Dispo: The patient is from: Home              Anticipated d/c is to: SNF              Anticipated d/c date is: 2 days               Patient currently is not medically stable to d/c.       Consultants:      Procedures:   Antimicrobials: azithromycin, ceftriaxone    Subjective: Pt c/o malaise    Objective: Vitals:   12/29/19 0743 12/29/19 2029 12/29/19 2314 12/30/19 0601  BP: (!) 141/78 (!) 154/89 128/70 (!) 169/94  Pulse: 96 80 84 82  Resp: 20 20 16 19   Temp: 98.1 F (36.7 C) 98.8 F (37.1 C) 98 F (36.7 C) 97.9 F (36.6 C)  TempSrc: Oral Oral Oral Oral  SpO2: 99% 99% 100% 96%  Weight:      Height:        Intake/Output Summary (Last 24 hours) at 12/30/2019 0743 Last data filed at 12/30/2019 0700 Gross per 24 hour  Intake 707.08 ml  Output 1750 ml  Net -1042.92 ml   Filed Weights   12/26/19 1500  Weight: 66.8 kg    Examination:  General exam: Appears calm & comfortable   Respiratory system: decreased breath sounds b/l. No rales Cardiovascular system: S1 & S2+. No clicks, rubs or gallops Gastrointestinal system: Abd is soft, non-tender,  non-distended & normal bowel sounds Central nervous system: Awake and oriented to person only today  Psychiatry: Judgement and insight appear abnormal. Flat mood and affect     Data Reviewed: I have personally reviewed following labs and imaging studies  CBC: Recent Labs  Lab 12/26/19 1510 12/27/19 0533 12/28/19 0620 12/29/19 0348 12/30/19 0344  WBC 23.0* 18.5* 16.4* 18.4* 15.4*  NEUTROABS 19.7*  --   --   --   --   HGB 12.1* 10.2* 10.0* 11.4* 11.0*  HCT 39.0 32.6* 31.3* 35.5* 35.1*  MCV 83.5 84.0 81.9 81.2 82.4  PLT 334 280 317 349 300   Basic Metabolic Panel: Recent Labs  Lab 12/26/19 1510  12/27/19 0533 12/28/19 0620 12/29/19 0348 12/30/19 0344  NA 135 137 137 137 137  K 5.4* 4.6 4.3 4.6 4.1  CL 98 104 105 103 102  CO2 27 25 23 24 23   GLUCOSE 130* 106* 118* 118* 110*  BUN 30* 36* 31* 27* 22  CREATININE 1.32* 1.38* 1.09 1.05 0.99  CALCIUM 8.7* 8.4* 8.2* 8.4* 8.4*   GFR: Estimated Creatinine Clearance: 58.1 mL/min (by C-G formula based on SCr of 0.99 mg/dL). Liver Function Tests: Recent Labs  Lab 12/26/19 1510  AST 23  ALT 18  ALKPHOS 85  BILITOT 0.9  PROT 8.0  ALBUMIN 3.2*   No results for input(s): LIPASE, AMYLASE in the last 168 hours. No results for input(s): AMMONIA in the last 168 hours. Coagulation Profile: No results for input(s): INR, PROTIME in the last 168 hours. Cardiac Enzymes: No results for input(s): CKTOTAL, CKMB, CKMBINDEX, TROPONINI in the last 168 hours. BNP (last 3 results) No results for input(s): PROBNP in the last 8760 hours. HbA1C: No results for input(s): HGBA1C in the last 72 hours. CBG: No results for input(s): GLUCAP in the last 168 hours. Lipid Profile: No results for input(s): CHOL, HDL, LDLCALC, TRIG, CHOLHDL, LDLDIRECT in the last 72 hours. Thyroid Function Tests: No results for input(s): TSH, T4TOTAL, FREET4, T3FREE, THYROIDAB in the last 72 hours. Anemia Panel: No results for input(s): VITAMINB12, FOLATE, FERRITIN, TIBC, IRON, RETICCTPCT in the last 72 hours. Sepsis Labs: Recent Labs  Lab 12/26/19 1510 12/26/19 1708  LATICACIDVEN 1.4 1.4    Recent Results (from the past 240 hour(s))  Resp Panel by RT-PCR (Flu A&B, Covid) Nasopharyngeal Swab     Status: None   Collection Time: 12/26/19  3:10 PM   Specimen: Nasopharyngeal Swab; Nasopharyngeal(NP) swabs in vial transport medium  Result Value Ref Range Status   SARS Coronavirus 2 by RT PCR NEGATIVE NEGATIVE Final    Comment: (NOTE) SARS-CoV-2 target nucleic acids are NOT DETECTED.  The SARS-CoV-2 RNA is generally detectable in upper respiratory specimens during  the acute phase of infection. The lowest concentration of SARS-CoV-2 viral copies this assay can detect is 138 copies/mL. A negative result does not preclude SARS-Cov-2 infection and should not be used as the sole basis for treatment or other patient management decisions. A negative result may occur with  improper specimen collection/handling, submission of specimen other than nasopharyngeal swab, presence of viral mutation(s) within the areas targeted by this assay, and inadequate number of viral copies(<138 copies/mL). A negative result must be combined with clinical observations, patient history, and epidemiological information. The expected result is Negative.  Fact Sheet for Patients:  EntrepreneurPulse.com.au  Fact Sheet for Healthcare Providers:  IncredibleEmployment.be  This test is no t yet approved or cleared by the [place] FDA and  has been authorized for detection  and/or diagnosis of SARS-CoV-2 by FDA under an Emergency Use Authorization (EUA). This EUA will remain  in effect (meaning this test can be used) for the duration of the COVID-19 declaration under Section 564(b)(1) of the Act, 21 U.S.C.section 360bbb-3(b)(1), unless the authorization is terminated  or revoked sooner.       Influenza A by PCR NEGATIVE NEGATIVE Final   Influenza B by PCR NEGATIVE NEGATIVE Final    Comment: (NOTE) The Xpert Xpress SARS-CoV-2/FLU/RSV plus assay is intended as an aid in the diagnosis of influenza from Nasopharyngeal swab specimens and should not be used as a sole basis for treatment. Nasal washings and aspirates are unacceptable for Xpert Xpress SARS-CoV-2/FLU/RSV testing.  Fact Sheet for Patients: EntrepreneurPulse.com.au  Fact Sheet for Healthcare Providers: IncredibleEmployment.be  This test is not yet approved or cleared by the [place] FDA and has been authorized for detection and/or  diagnosis of SARS-CoV-2 by FDA under an Emergency Use Authorization (EUA). This EUA will remain in effect (meaning this test can be used) for the duration of the COVID-19 declaration under Section 564(b)(1) of the Act, 21 U.S.C. section 360bbb-3(b)(1), unless the authorization is terminated or revoked.  Performed at [facility], [address] [place], [place] [address]   Culture, blood (routine x 2)     Status: None (Preliminary result)   Collection Time: 12/26/19  3:10 PM   Specimen: BLOOD  Result Value Ref Range Status   Specimen Description BLOOD RIGHT ARM  Final   Special Requests   Final    BOTTLES DRAWN AEROBIC AND ANAEROBIC Blood Culture results may not be optimal due to an excessive volume of blood received in culture bottles   Culture   Final    NO GROWTH 4 DAYS Performed at [facility], [address], [place], [place] [address]    Report Status PENDING  Incomplete  Culture, blood (routine x 2)     Status: None (Preliminary result)   Collection Time: 12/26/19  3:11 PM   Specimen: BLOOD  Result Value Ref Range Status   Specimen Description BLOOD LEFT FORE ARM  Final   Special Requests   Final    BOTTLES DRAWN AEROBIC AND ANAEROBIC Blood Culture adequate volume   Culture   Final    NO GROWTH 4 DAYS Performed at [facility], [address] [place], [place] [address]    Report Status PENDING  Incomplete         Radiology Studies: No results found.      Scheduled Meds: . aspirin EC  81 mg Oral Daily  . clopidogrel  75 mg Oral Daily  . enoxaparin (LOVENOX) injection  40 mg Subcutaneous Q24H  . lidocaine  1 patch Transdermal Q24H  . polyethylene glycol  34 g Oral BID  . polyvinyl alcohol  1 drop Both Eyes BID  . rosuvastatin  10 mg Oral Daily  . sodium chloride flush  3 mL Intravenous Q12H   Continuous Infusions: . sodium chloride 1,000 mL (12/30/19 0509)  . azithromycin Stopped (12/29/19 2154)  . cefTRIAXone  (ROCEPHIN)  IV 2 g (12/30/19 0510)     LOS: 4 days    Time spent: 31 mins     Wyvonnia Dusky, MD [facility] Pager [phone]  If 7PM-7AM, please contact night-coverage 12/30/2019, 7:43 AM

## 2019-12-31 DIAGNOSIS — E440 Moderate protein-calorie malnutrition: Secondary | ICD-10-CM

## 2019-12-31 LAB — CULTURE, BLOOD (ROUTINE X 2)
Culture: NO GROWTH
Culture: NO GROWTH
Special Requests: ADEQUATE

## 2019-12-31 LAB — CBC
HCT: 36.4 % — ABNORMAL LOW (ref 39.0–52.0)
Hemoglobin: 11.6 g/dL — ABNORMAL LOW (ref 13.0–17.0)
MCH: 26 pg (ref 26.0–34.0)
MCHC: 31.9 g/dL (ref 30.0–36.0)
MCV: 81.4 fL (ref 80.0–100.0)
Platelets: 333 10*3/uL (ref 150–400)
RBC: 4.47 MIL/uL (ref 4.22–5.81)
RDW: 13.2 % (ref 11.5–15.5)
WBC: 15.4 10*3/uL — ABNORMAL HIGH (ref 4.0–10.5)
nRBC: 0 % (ref 0.0–0.2)

## 2019-12-31 LAB — BASIC METABOLIC PANEL
Anion gap: 11 (ref 5–15)
BUN: 22 mg/dL (ref 8–23)
CO2: 27 mmol/L (ref 22–32)
Calcium: 8.4 mg/dL — ABNORMAL LOW (ref 8.9–10.3)
Chloride: 100 mmol/L (ref 98–111)
Creatinine, Ser: 1.11 mg/dL (ref 0.61–1.24)
GFR, Estimated: >60 mL/min (ref >60)
Glucose, Bld: 117 mg/dL — ABNORMAL HIGH (ref 70–99)
Potassium: 4.1 mmol/L (ref 3.5–5.1)
Sodium: 138 mmol/L (ref 135–145)

## 2019-12-31 NOTE — TOC Progression Note (Addendum)
Transition of Care [facility]) - Progression Note    Patient Details  Name: [name] [name] MRN: [id number] Date of Birth: [birth year]  Transition of Care [facility]) CM/SW [place], LCSW Phone Number: 12/31/2019, 11:38 AM  Clinical Narrative:  Received voicemail from [place] admissions coordinator stating that they just found out they are not in network with his [facility] plan. CSW notified sister. She is agreeable to return to [facility]. CSW left voicemail for admissions coordinator asking her to start insurance authorization.   12:44 pm: Received call back from [facility] admissions coordinator. She will start insurance authorization this afternoon.  Expected Discharge Plan: Skilled Nursing Facility Barriers to Discharge: Ship broker, Continued Medical Work up  Expected Discharge Plan and Services Expected Discharge Plan: [place] Acute Care Choice: Resumption of Svcs/PTA Provider Living arrangements for the past 2 months: [place]                                       Social Determinants of Health (SDOH) Interventions    Readmission Risk Interventions No flowsheet data found.

## 2019-12-31 NOTE — Progress Notes (Signed)
Mobility Specialist - Progress Note   12/31/19 1342  Mobility  Activity Contraindicated/medical hold  Mobility performed by Mobility specialist    Attempted mobility session at this time, pt seems very tired and fatigue. Pt can barely keep his eyes while mobility specialist speaks to him. Per discussion w/ nurse, pt didn't sleep much last night. Will hold off and re-attempt at a later date/time.    Mardy Hoppe Mobility Specialist  12/31/19, 1:44 PM

## 2019-12-31 NOTE — Progress Notes (Signed)
PROGRESS NOTE    [name] [name]  JJO:[id number] DOB: Nov 08, 1940 DOA: 12/26/2019 PCP: [name], [name], MD   Assessment & Plan:   Principal Problem:   Lobar pneumonia [facility]) Active Problems:   Spinal stenosis, lumbar region, with neurogenic claudication   Chronic pain disorder   AKI (acute kidney injury) ([place])   CVA (cerebral vascular accident) ([place])   Acute hypoxemic respiratory failure ([place])   Dysphagia   Acute encephalopathy   Malnutrition of moderate degree    Right middle and lower lobar pneumonia: w/ assoc. small parapneumonic effusion. Slowly improving. Continue on IV ceftriaxone, azithromycin. Continue on bronchodilators and encourage incentive spirometry. Legionella is pending. Strept is neg   Acute hypoxic respiratory failure: secondary to above. Pt evidently desats into mid 80s when sleeping so continue w/ 2L [place] while sleeping, otherwise pt does not need oxygen   Acute metabolic encephalopathy: likely secondary to worsening dementia vs infection. Oriented to person only today. Continue w/ supportive care  Leukocytosis: secondary to infection. Continue on IV abxs   Likely AKI on CKD stage II: likely secondary to poor oral intake, losartan & lasix. Cr is back to baseline   Likely ACD: H&H are stable. Will monitor   Hyperkalemia: resolved   Hx of CVA w/ residual dysarthria: continue on plavix, statin & aspirin   Dysphagia: likely secondary to recent CVA. Continue dysphagia II diet as per speech   Chronic pain disorder: of neck and back. Tylenol prn   Mild cognitive impairment: as per speech therapy documentation. Hx of dementia as per pt's sister   Moderate protein calorie malnutrition: continue w/ nutritional supplements     DVT prophylaxis: lovenox Code Status: full Family Communication: discussed pt's care w/ pt's sister again today, Mary, via phone and answered her questions  Disposition Plan: likely d/c to SNF   Status is:  Inpatient  Remains inpatient appropriate because:Altered mental status and IV treatments appropriate due to intensity of illness or inability to take PO, awaiting on SNF placement as per CM    Dispo: The patient is from: Home              Anticipated d/c is to: SNF              Anticipated d/c date is: 2 days               Patient currently is not medically stable to d/c.       Consultants:      Procedures:   Antimicrobials: azithromycin, ceftriaxone    Subjective: Pt is oriented to person only today    Objective: Vitals:   12/30/19 1549 12/30/19 1929 12/30/19 2332 12/31/19 0415  BP: (!) 148/76 136/71 (!) 155/88 138/67  Pulse: 83 87 75 67  Resp: 16 18 20 18   Temp: 98.1 F (36.7 C) 98.8 F (37.1 C) 97.6 F (36.4 C) 98.4 F (36.9 C)  TempSrc: Oral  Oral   SpO2: 100% 100% 94% 99%  Weight:      Height:        Intake/Output Summary (Last 24 hours) at 12/31/2019 0750 Last data filed at 12/30/2019 1726 Gross per 24 hour  Intake 720 ml  Output 1000 ml  Net -280 ml   Filed Weights   12/26/19 1500  Weight: 66.8 kg    Examination:  General exam: Appears drowsy    Respiratory system: diminished breath sounds b/l. No rales, wheezes Cardiovascular system: S1 & S2+. No clicks or rubs  Gastrointestinal system: Abd  is soft, non-tender, non-distended & normal bowel sounds Central nervous system: Drowsy. Oriented to person only today  Psychiatry: Judgement and insight appear abnormal. Flat mood and affect     Data Reviewed: I have personally reviewed following labs and imaging studies  CBC: Recent Labs  Lab 12/26/19 1510 12/26/19 1510 12/27/19 0533 12/28/19 0620 12/29/19 0348 12/30/19 0344 12/31/19 0550  WBC 23.0*   < > 18.5* 16.4* 18.4* 15.4* 15.4*  NEUTROABS 19.7*  --   --   --   --   --   --   HGB 12.1*   < > 10.2* 10.0* 11.4* 11.0* 11.6*  HCT 39.0   < > 32.6* 31.3* 35.5* 35.1* 36.4*  MCV 83.5   < > 84.0 81.9 81.2 82.4 81.4  PLT 334   < > 280 317  349 327 333   < > = values in this interval not displayed.   Basic Metabolic Panel: Recent Labs  Lab 12/27/19 0533 12/28/19 0620 12/29/19 0348 12/30/19 0344 12/31/19 0550  NA 137 137 137 137 138  K 4.6 4.3 4.6 4.1 4.1  CL 104 105 103 102 100  CO2 25 23 24 23 27   GLUCOSE 106* 118* 118* 110* 117*  BUN 36* 31* 27* 22 22  CREATININE 1.38* 1.09 1.05 0.99 1.11  CALCIUM 8.4* 8.2* 8.4* 8.4* 8.4*   GFR: Estimated Creatinine Clearance: 51.8 mL/min (by C-G formula based on SCr of 1.11 mg/dL). Liver Function Tests: Recent Labs  Lab 12/26/19 1510  AST 23  ALT 18  ALKPHOS 85  BILITOT 0.9  PROT 8.0  ALBUMIN 3.2*   No results for input(s): LIPASE, AMYLASE in the last 168 hours. No results for input(s): AMMONIA in the last 168 hours. Coagulation Profile: No results for input(s): INR, PROTIME in the last 168 hours. Cardiac Enzymes: No results for input(s): CKTOTAL, CKMB, CKMBINDEX, TROPONINI in the last 168 hours. BNP (last 3 results) No results for input(s): PROBNP in the last 8760 hours. HbA1C: No results for input(s): HGBA1C in the last 72 hours. CBG: No results for input(s): GLUCAP in the last 168 hours. Lipid Profile: No results for input(s): CHOL, HDL, LDLCALC, TRIG, CHOLHDL, LDLDIRECT in the last 72 hours. Thyroid Function Tests: No results for input(s): TSH, T4TOTAL, FREET4, T3FREE, THYROIDAB in the last 72 hours. Anemia Panel: No results for input(s): VITAMINB12, FOLATE, FERRITIN, TIBC, IRON, RETICCTPCT in the last 72 hours. Sepsis Labs: Recent Labs  Lab 12/26/19 1510 12/26/19 1708  LATICACIDVEN 1.4 1.4    Recent Results (from the past 240 hour(s))  Resp Panel by RT-PCR (Flu A&B, Covid) Nasopharyngeal Swab     Status: None   Collection Time: 12/26/19  3:10 PM   Specimen: Nasopharyngeal Swab; Nasopharyngeal(NP) swabs in vial transport medium  Result Value Ref Range Status   SARS Coronavirus 2 by RT PCR NEGATIVE NEGATIVE Final    Comment: (NOTE) SARS-CoV-2  target nucleic acids are NOT DETECTED.  The SARS-CoV-2 RNA is generally detectable in upper respiratory specimens during the acute phase of infection. The lowest concentration of SARS-CoV-2 viral copies this assay can detect is 138 copies/mL. A negative result does not preclude SARS-Cov-2 infection and should not be used as the sole basis for treatment or other patient management decisions. A negative result may occur with  improper specimen collection/handling, submission of specimen other than nasopharyngeal swab, presence of viral mutation(s) within the areas targeted by this assay, and inadequate number of viral copies(<138 copies/mL). A negative result must be combined with clinical  observations, patient history, and epidemiological information. The expected result is Negative.  Fact Sheet for Patients:  EntrepreneurPulse.com.au  Fact Sheet for Healthcare Providers:  IncredibleEmployment.be  This test is no t yet approved or cleared by the [place] FDA and  has been authorized for detection and/or diagnosis of SARS-CoV-2 by FDA under an Emergency Use Authorization (EUA). This EUA will remain  in effect (meaning this test can be used) for the duration of the COVID-19 declaration under Section 564(b)(1) of the Act, 21 U.S.C.section 360bbb-3(b)(1), unless the authorization is terminated  or revoked sooner.       Influenza A by PCR NEGATIVE NEGATIVE Final   Influenza B by PCR NEGATIVE NEGATIVE Final    Comment: (NOTE) The Xpert Xpress SARS-CoV-2/FLU/RSV plus assay is intended as an aid in the diagnosis of influenza from Nasopharyngeal swab specimens and should not be used as a sole basis for treatment. Nasal washings and aspirates are unacceptable for Xpert Xpress SARS-CoV-2/FLU/RSV testing.  Fact Sheet for Patients: EntrepreneurPulse.com.au  Fact Sheet for Healthcare  Providers: IncredibleEmployment.be  This test is not yet approved or cleared by the [place] FDA and has been authorized for detection and/or diagnosis of SARS-CoV-2 by FDA under an Emergency Use Authorization (EUA). This EUA will remain in effect (meaning this test can be used) for the duration of the COVID-19 declaration under Section 564(b)(1) of the Act, 21 U.S.C. section 360bbb-3(b)(1), unless the authorization is terminated or revoked.  Performed at [facility], [address] [place], [place] [address]   Culture, blood (routine x 2)     Status: None (Preliminary result)   Collection Time: 12/26/19  3:10 PM   Specimen: BLOOD  Result Value Ref Range Status   Specimen Description BLOOD RIGHT ARM  Final   Special Requests   Final    BOTTLES DRAWN AEROBIC AND ANAEROBIC Blood Culture results may not be optimal due to an excessive volume of blood received in culture bottles   Culture   Final    NO GROWTH 4 DAYS Performed at [facility], [address], [place], [place] [address]    Report Status PENDING  Incomplete  Culture, blood (routine x 2)     Status: None (Preliminary result)   Collection Time: 12/26/19  3:11 PM   Specimen: BLOOD  Result Value Ref Range Status   Specimen Description BLOOD LEFT FORE ARM  Final   Special Requests   Final    BOTTLES DRAWN AEROBIC AND ANAEROBIC Blood Culture adequate volume   Culture   Final    NO GROWTH 4 DAYS Performed at [facility], [address], [place], [place] [address]    Report Status PENDING  Incomplete         Radiology Studies: No results found.      Scheduled Meds: . aspirin EC  81 mg Oral Daily  . clopidogrel  75 mg Oral Daily  . enoxaparin (LOVENOX) injection  40 mg Subcutaneous Q24H  . feeding supplement  237 mL Oral BID BM  . lidocaine  1 patch Transdermal Q24H  . polyethylene glycol  34 g Oral BID  . polyvinyl alcohol  1 drop Both Eyes BID  .  rosuvastatin  10 mg Oral Daily  . sodium chloride flush  3 mL Intravenous Q12H   Continuous Infusions: . sodium chloride 1,000 mL (12/30/19 0509)     LOS: 5 days    Time spent: 30 mins     Wyvonnia Dusky, MD [facility] Pager [phone]  If 7PM-7AM, please contact night-coverage 12/31/2019, 7:50 AM

## 2020-01-01 ENCOUNTER — Inpatient Hospital Stay: Payer: Medicare HMO

## 2020-01-01 ENCOUNTER — Encounter: Payer: Self-pay | Admitting: Family Medicine

## 2020-01-01 LAB — BASIC METABOLIC PANEL
Anion gap: 8 (ref 5–15)
BUN: 19 mg/dL (ref 8–23)
CO2: 27 mmol/L (ref 22–32)
Calcium: 8.3 mg/dL — ABNORMAL LOW (ref 8.9–10.3)
Chloride: 101 mmol/L (ref 98–111)
Creatinine, Ser: 0.87 mg/dL (ref 0.61–1.24)
GFR, Estimated: >60 mL/min (ref >60)
Glucose, Bld: 119 mg/dL — ABNORMAL HIGH (ref 70–99)
Potassium: 4.2 mmol/L (ref 3.5–5.1)
Sodium: 136 mmol/L (ref 135–145)

## 2020-01-01 LAB — CBC
HCT: 37.3 % — ABNORMAL LOW (ref 39.0–52.0)
Hemoglobin: 11.5 g/dL — ABNORMAL LOW (ref 13.0–17.0)
MCH: 25.7 pg — ABNORMAL LOW (ref 26.0–34.0)
MCHC: 30.8 g/dL (ref 30.0–36.0)
MCV: 83.3 fL (ref 80.0–100.0)
Platelets: 319 10*3/uL (ref 150–400)
RBC: 4.48 MIL/uL (ref 4.22–5.81)
RDW: 13.2 % (ref 11.5–15.5)
WBC: 15.9 10*3/uL — ABNORMAL HIGH (ref 4.0–10.5)
nRBC: 0 % (ref 0.0–0.2)

## 2020-01-01 LAB — BRAIN NATRIURETIC PEPTIDE: B Natriuretic Peptide: 177.1 pg/mL — ABNORMAL HIGH (ref 0.0–100.0)

## 2020-01-01 LAB — PROTIME-INR
INR: 1.1 (ref 0.8–1.2)
Prothrombin Time: 14.1 seconds (ref 11.4–15.2)

## 2020-01-01 LAB — APTT: aPTT: 38 seconds — ABNORMAL HIGH (ref 24–36)

## 2020-01-01 LAB — LEGIONELLA PNEUMOPHILA SEROGP 1 UR AG: L. pneumophila Serogp 1 Ur Ag: NEGATIVE

## 2020-01-01 LAB — LACTATE DEHYDROGENASE: LDH: 140 U/L (ref 98–192)

## 2020-01-01 MED ORDER — SODIUM CHLORIDE 0.9 % IV SOLN
2.0000 g | INTRAVENOUS | Status: DC
  Administered 2020-01-01 – 2020-01-02 (×2): 2 g via INTRAVENOUS
  Filled 2020-01-01: qty 2
  Filled 2020-01-01: qty 20
  Filled 2020-01-01: qty 2

## 2020-01-01 MED ORDER — IOHEXOL 300 MG/ML  SOLN
75.0000 mL | Freq: Once | INTRAMUSCULAR | Status: AC | PRN
  Administered 2020-01-01: 75 mL via INTRAVENOUS

## 2020-01-01 MED ORDER — SENNOSIDES-DOCUSATE SODIUM 8.6-50 MG PO TABS
2.0000 | ORAL_TABLET | Freq: Two times a day (BID) | ORAL | Status: DC
  Administered 2020-01-01 (×2): 2 via ORAL
  Filled 2020-01-01 (×2): qty 2

## 2020-01-01 NOTE — Progress Notes (Addendum)
PROGRESS NOTE    [name] [name]  VXB:[id number] DOB: 1940/05/09 DOA: 12/26/2019 PCP: [name], [name], MD   Chief complaint.  Shortness of breath. Brief Narrative:   79 year old man PMH lacunar infarct October 2021 with residual dysarthria, hypertension, hyperlipidemia, spinal stenosis who was sent from the [facility] with a reported diagnosis of confusion and pneumonia.  Work-up in the emergency department revealed right middle and right lower lobe pneumonia and acute hypoxic respiratory failure.  Patient is a placed on Rocephin and azithromycin.  Blood cultures are negative.  Patient is also evaluate by speech therapy, placed on dysphagia 2 diet.  12/1.  Patient still on oxygen, repeated chest x-ray showed an increased right sided parapneumonic effusion.  Obtain thoracentesis.  Continue Rocephin for now.   Assessment & Plan:   Principal Problem:   Lobar pneumonia ([place]) Active Problems:   Spinal stenosis, lumbar region, with neurogenic claudication   Chronic pain disorder   AKI (acute kidney injury) ([place])   CVA (cerebral vascular accident) ([place])   Acute hypoxemic respiratory failure ([place])   Dysphagia   Acute encephalopathy   Malnutrition of moderate degree  #1.  Acute hypoxemic respiratory failure. Right middle and lower lobe pneumonia. Right-sided parapneumonic effusion. Patient overall condition is improving.  Still on oxygen.   Patient has been evaluated by speech therapy, on dysphagia 2 diet, no evidence of continued aspiration. Repeat chest x-ray showed increased right sided pleural effusion.  Independently reviewed x-ray images.  Will obtain thoracentesis to rule out empyema. Continue Rocephin for now. Initial BNP not elevated, repeat a BNP at the patient still has continued hypoxemia.  #2.  Acute metabolic encephalopathy. Dementia. Patient still has some confusion.  Continue supportive care.  3.  Acute kidney injury on chronic kidney disease stage  II. Renal function improved. Reviewed the previous lab over the past year, GFR > 60 at baseline.  4.  History of CVA with residual dysphagia.  Dysarthria. Seen by speech therapy, no evidence of aspiration.   1307.  Discussed with Dr. Pascal Lux from interventional radiology, ultrasound showed a very thick pleural effusion.  Will obtain CT scan, patient probably need CT surgery.  DVT prophylaxis: Lovenox Code Status: Full Family Communication: Sister updated Disposition Plan:  .   Status is: Inpatient  Remains inpatient appropriate because:Inpatient level of care appropriate due to severity of illness patient has worsening right pleural effusion.  Needed continue IV antibiotics coverage, rule out empyema.   Dispo: The patient is from: SNF              Anticipated d/c is to: SNF              Anticipated d/c date is: 1 day              Patient currently is not medically stable to d/c.        I/O last 3 completed shifts: In: 578.8 [P.O.:480; I.V.:98.8] Out: 1350 [Urine:1350] No intake/output data recorded.     Consultants:   None  Procedures: Thoracentesis scheduled  Antimicrobials:  Rocephin.  Subjective: Patient still has some short of breath with exertion, nonproductive cough.  He has some confusion, but no agitation. No fever or chills. No abdominal pain or nausea vomiting.  No constipation or diarrhea.  Last bowel movement 2 days ago No dysuria hematuria pain Denies any headache or dizziness. No chest pain or palpitation  Objective: Vitals:   12/31/19 2006 12/31/19 2331 01/01/20 0413 01/01/20 0744  BP: (!) 153/94 [name])  151/93 (!) 164/94 132/75  Pulse: 64 73 73 78  Resp: 18 20 20 18   Temp: 98 F (36.7 C) 97.6 F (36.4 C) 97.6 F (36.4 C) 99 F (37.2 C)  TempSrc: Oral Oral Oral Oral  SpO2: (!) 89% 99% 97% 98%  Weight:      Height:        Intake/Output Summary (Last 24 hours) at 01/01/2020 1008 Last data filed at 01/01/2020 0500 Gross per 24 hour   Intake 578.82 ml  Output 650 ml  Net -71.18 ml   Filed Weights   12/26/19 1500  Weight: 66.8 kg    Examination:  General exam: Appears calm and comfortable  Respiratory system: Decreased breathing sounds without crackles or wheezes. Respiratory effort normal. Cardiovascular system: S1 & S2 heard, RRR. No JVD, murmurs, rubs, gallops or clicks. No pedal edema. Gastrointestinal system: Abdomen is nondistended, soft and nontender. No organomegaly or masses felt. Normal bowel sounds heard. Central nervous system: Alert and oriented x2. No focal neurological deficits. Extremities: Symmetric  Skin: No rashes, lesions or ulcers Psychiatry: Mood & affect appropriate.     Data Reviewed: I have personally reviewed following labs and imaging studies  CBC: Recent Labs  Lab 12/26/19 1510 12/27/19 0533 12/28/19 0620 12/29/19 0348 12/30/19 0344 12/31/19 0550 01/01/20 0357  WBC 23.0*   < > 16.4* 18.4* 15.4* 15.4* 15.9*  NEUTROABS 19.7*  --   --   --   --   --   --   HGB 12.1*   < > 10.0* 11.4* 11.0* 11.6* 11.5*  HCT 39.0   < > 31.3* 35.5* 35.1* 36.4* 37.3*  MCV 83.5   < > 81.9 81.2 82.4 81.4 83.3  PLT 334   < > 317 349 327 333 319   < > = values in this interval not displayed.   Basic Metabolic Panel: Recent Labs  Lab 12/28/19 0620 12/29/19 0348 12/30/19 0344 12/31/19 0550 01/01/20 0357  NA 137 137 137 138 136  K 4.3 4.6 4.1 4.1 4.2  CL 105 103 102 100 101  CO2 23 24 23 27 27   GLUCOSE 118* 118* 110* 117* 119*  BUN 31* 27* 22 22 19   CREATININE 1.09 1.05 0.99 1.11 0.87  CALCIUM 8.2* 8.4* 8.4* 8.4* 8.3*   GFR: Estimated Creatinine Clearance: 66.1 mL/min (by C-G formula based on SCr of 0.87 mg/dL). Liver Function Tests: Recent Labs  Lab 12/26/19 1510  AST 23  ALT 18  ALKPHOS 85  BILITOT 0.9  PROT 8.0  ALBUMIN 3.2*   No results for input(s): LIPASE, AMYLASE in the last 168 hours. No results for input(s): AMMONIA in the last 168 hours. Coagulation Profile: No  results for input(s): INR, PROTIME in the last 168 hours. Cardiac Enzymes: No results for input(s): CKTOTAL, CKMB, CKMBINDEX, TROPONINI in the last 168 hours. BNP (last 3 results) No results for input(s): PROBNP in the last 8760 hours. HbA1C: No results for input(s): HGBA1C in the last 72 hours. CBG: No results for input(s): GLUCAP in the last 168 hours. Lipid Profile: No results for input(s): CHOL, HDL, LDLCALC, TRIG, CHOLHDL, LDLDIRECT in the last 72 hours. Thyroid Function Tests: No results for input(s): TSH, T4TOTAL, FREET4, T3FREE, THYROIDAB in the last 72 hours. Anemia Panel: No results for input(s): VITAMINB12, FOLATE, FERRITIN, TIBC, IRON, RETICCTPCT in the last 72 hours. Sepsis Labs: Recent Labs  Lab 12/26/19 1510 12/26/19 1708  LATICACIDVEN 1.4 1.4    Recent Results (from the past 240 hour(s))  Resp Panel  by RT-PCR (Flu A&B, Covid) Nasopharyngeal Swab     Status: None   Collection Time: 12/26/19  3:10 PM   Specimen: Nasopharyngeal Swab; Nasopharyngeal(NP) swabs in vial transport medium  Result Value Ref Range Status   SARS Coronavirus 2 by RT PCR NEGATIVE NEGATIVE Final    Comment: (NOTE) SARS-CoV-2 target nucleic acids are NOT DETECTED.  The SARS-CoV-2 RNA is generally detectable in upper respiratory specimens during the acute phase of infection. The lowest concentration of SARS-CoV-2 viral copies this assay can detect is 138 copies/mL. A negative result does not preclude SARS-Cov-2 infection and should not be used as the sole basis for treatment or other patient management decisions. A negative result may occur with  improper specimen collection/handling, submission of specimen other than nasopharyngeal swab, presence of viral mutation(s) within the areas targeted by this assay, and inadequate number of viral copies(<138 copies/mL). A negative result must be combined with clinical observations, patient history, and epidemiological information. The expected  result is Negative.  Fact Sheet for Patients:  EntrepreneurPulse.com.au  Fact Sheet for Healthcare Providers:  IncredibleEmployment.be  This test is no t yet approved or cleared by the [place] FDA and  has been authorized for detection and/or diagnosis of SARS-CoV-2 by FDA under an Emergency Use Authorization (EUA). This EUA will remain  in effect (meaning this test can be used) for the duration of the COVID-19 declaration under Section 564(b)(1) of the Act, 21 U.S.C.section 360bbb-3(b)(1), unless the authorization is terminated  or revoked sooner.       Influenza A by PCR NEGATIVE NEGATIVE Final   Influenza B by PCR NEGATIVE NEGATIVE Final    Comment: (NOTE) The Xpert Xpress SARS-CoV-2/FLU/RSV plus assay is intended as an aid in the diagnosis of influenza from Nasopharyngeal swab specimens and should not be used as a sole basis for treatment. Nasal washings and aspirates are unacceptable for Xpert Xpress SARS-CoV-2/FLU/RSV testing.  Fact Sheet for Patients: EntrepreneurPulse.com.au  Fact Sheet for Healthcare Providers: IncredibleEmployment.be  This test is not yet approved or cleared by the [place] FDA and has been authorized for detection and/or diagnosis of SARS-CoV-2 by FDA under an Emergency Use Authorization (EUA). This EUA will remain in effect (meaning this test can be used) for the duration of the COVID-19 declaration under Section 564(b)(1) of the Act, 21 U.S.C. section 360bbb-3(b)(1), unless the authorization is terminated or revoked.  Performed at [facility], [address] [place], [place] [address]   Culture, blood (routine x 2)     Status: None   Collection Time: 12/26/19  3:10 PM   Specimen: BLOOD  Result Value Ref Range Status   Specimen Description BLOOD RIGHT ARM  Final   Special Requests   Final    BOTTLES DRAWN AEROBIC AND ANAEROBIC Blood Culture  results may not be optimal due to an excessive volume of blood received in culture bottles   Culture   Final    NO GROWTH 5 DAYS Performed at [facility], [address] [place], [place] [address]    Report Status 12/31/2019 FINAL  Final  Culture, blood (routine x 2)     Status: None   Collection Time: 12/26/19  3:11 PM   Specimen: BLOOD  Result Value Ref Range Status   Specimen Description BLOOD LEFT FORE ARM  Final   Special Requests   Final    BOTTLES DRAWN AEROBIC AND ANAEROBIC Blood Culture adequate volume   Culture   Final    NO GROWTH 5  DAYS Performed at [facility], [address], [place],  [address]    Report Status 12/31/2019 FINAL  Final         Radiology Studies: DG Chest 2 View  Result Date: 01/01/2020 CLINICAL DATA:  Altered mental status, lobar pneumonia. EXAM: CHEST - 2 VIEW COMPARISON:  December 26, 2019. FINDINGS: Stable cardiomediastinal silhouette. No pneumothorax is noted. Left lung is clear. Moderate size right pleural effusion is noted with associated right basilar atelectasis or infiltrate. Bony thorax is unremarkable. IMPRESSION: Moderate size right pleural effusion with associated right basilar atelectasis or infiltrate. Electronically Signed   By: Marijo Conception M.D.   On: 01/01/2020 08:22        Scheduled Meds: . aspirin EC  81 mg Oral Daily  . clopidogrel  75 mg Oral Daily  . enoxaparin (LOVENOX) injection  40 mg Subcutaneous Q24H  . feeding supplement  237 mL Oral BID BM  . lidocaine  1 patch Transdermal Q24H  . polyethylene glycol  34 g Oral BID  . polyvinyl alcohol  1 drop Both Eyes BID  . rosuvastatin  10 mg Oral Daily  . sodium chloride flush  3 mL Intravenous Q12H   Continuous Infusions: . sodium chloride 10 mL/hr at 12/31/19 1441     LOS: 6 days    Time spent: 36 minutes    [name], MD [facility]   To contact the attending provider between 7A-7P or the covering provider during  after hours 7P-7A, please log into the web site [email or web address] and access using universal [place] password for that web site. If you do not have the password, please call the hospital operator.  01/01/2020, 10:08 AM

## 2020-01-01 NOTE — Progress Notes (Signed)
PT Cancellation Note  Patient Details Name: [name] [name] MRN: [id number] DOB: January 03, 1941   Cancelled Treatment:     PT attempt. Pt off floor for CT scan. Will return next date and continue to follow per POC.   Willette Pa 01/01/2020, 3:28 PM

## 2020-01-02 ENCOUNTER — Inpatient Hospital Stay (HOSPITAL_COMMUNITY)
Admission: AD | Admit: 2020-01-02 | Discharge: 2020-03-19 | DRG: 177 | Disposition: A | Discharge status: Skilled Nursing Facility | Payer: Medicare HMO | Source: Other Acute Inpatient Hospital | Attending: Family Medicine | Admitting: Family Medicine

## 2020-01-02 DIAGNOSIS — J869 Pyothorax without fistula: Principal | ICD-10-CM

## 2020-01-02 DIAGNOSIS — J181 Lobar pneumonia, unspecified organism: Secondary | ICD-10-CM | POA: Diagnosis not present

## 2020-01-02 DIAGNOSIS — F0390 Unspecified dementia without behavioral disturbance: Secondary | ICD-10-CM | POA: Diagnosis not present

## 2020-01-02 DIAGNOSIS — Z4682 Encounter for fitting and adjustment of non-vascular catheter: Secondary | ICD-10-CM | POA: Diagnosis not present

## 2020-01-02 DIAGNOSIS — E785 Hyperlipidemia, unspecified: Secondary | ICD-10-CM | POA: Diagnosis present

## 2020-01-02 DIAGNOSIS — I69319 Unspecified symptoms and signs involving cognitive functions following cerebral infarction: Secondary | ICD-10-CM | POA: Diagnosis not present

## 2020-01-02 DIAGNOSIS — E43 Unspecified severe protein-calorie malnutrition: Secondary | ICD-10-CM | POA: Diagnosis not present

## 2020-01-02 DIAGNOSIS — D649 Anemia, unspecified: Secondary | ICD-10-CM | POA: Diagnosis present

## 2020-01-02 DIAGNOSIS — I69354 Hemiplegia and hemiparesis following cerebral infarction affecting left non-dominant side: Secondary | ICD-10-CM | POA: Diagnosis not present

## 2020-01-02 DIAGNOSIS — I517 Cardiomegaly: Secondary | ICD-10-CM | POA: Diagnosis not present

## 2020-01-02 DIAGNOSIS — N182 Chronic kidney disease, stage 2 (mild): Secondary | ICD-10-CM | POA: Diagnosis present

## 2020-01-02 DIAGNOSIS — N289 Disorder of kidney and ureter, unspecified: Secondary | ICD-10-CM | POA: Diagnosis not present

## 2020-01-02 DIAGNOSIS — Z9842 Cataract extraction status, left eye: Secondary | ICD-10-CM | POA: Diagnosis not present

## 2020-01-02 DIAGNOSIS — Z20822 Contact with and (suspected) exposure to covid-19: Secondary | ICD-10-CM | POA: Diagnosis present

## 2020-01-02 DIAGNOSIS — J189 Pneumonia, unspecified organism: Secondary | ICD-10-CM | POA: Diagnosis not present

## 2020-01-02 DIAGNOSIS — R627 Adult failure to thrive: Secondary | ICD-10-CM | POA: Diagnosis present

## 2020-01-02 DIAGNOSIS — Z9689 Presence of other specified functional implants: Secondary | ICD-10-CM | POA: Diagnosis not present

## 2020-01-02 DIAGNOSIS — J918 Pleural effusion in other conditions classified elsewhere: Secondary | ICD-10-CM | POA: Diagnosis present

## 2020-01-02 DIAGNOSIS — L8990 Pressure ulcer of unspecified site, unspecified stage: Secondary | ICD-10-CM | POA: Insufficient documentation

## 2020-01-02 DIAGNOSIS — Z66 Do not resuscitate: Secondary | ICD-10-CM | POA: Diagnosis not present

## 2020-01-02 DIAGNOSIS — I129 Hypertensive chronic kidney disease with stage 1 through stage 4 chronic kidney disease, or unspecified chronic kidney disease: Secondary | ICD-10-CM | POA: Diagnosis present

## 2020-01-02 DIAGNOSIS — R410 Disorientation, unspecified: Secondary | ICD-10-CM | POA: Diagnosis not present

## 2020-01-02 DIAGNOSIS — N179 Acute kidney failure, unspecified: Secondary | ICD-10-CM

## 2020-01-02 DIAGNOSIS — Z8673 Personal history of transient ischemic attack (TIA), and cerebral infarction without residual deficits: Secondary | ICD-10-CM | POA: Diagnosis not present

## 2020-01-02 DIAGNOSIS — J90 Pleural effusion, not elsewhere classified: Secondary | ICD-10-CM

## 2020-01-02 DIAGNOSIS — Z961 Presence of intraocular lens: Secondary | ICD-10-CM | POA: Diagnosis present

## 2020-01-02 DIAGNOSIS — R6520 Severe sepsis without septic shock: Secondary | ICD-10-CM | POA: Diagnosis not present

## 2020-01-02 DIAGNOSIS — Z833 Family history of diabetes mellitus: Secondary | ICD-10-CM | POA: Diagnosis not present

## 2020-01-02 DIAGNOSIS — Z7189 Other specified counseling: Secondary | ICD-10-CM | POA: Diagnosis not present

## 2020-01-02 DIAGNOSIS — Z6821 Body mass index (BMI) 21.0-21.9, adult: Secondary | ICD-10-CM | POA: Diagnosis not present

## 2020-01-02 DIAGNOSIS — A419 Sepsis, unspecified organism: Secondary | ICD-10-CM

## 2020-01-02 DIAGNOSIS — L89152 Pressure ulcer of sacral region, stage 2: Secondary | ICD-10-CM | POA: Diagnosis present

## 2020-01-02 DIAGNOSIS — R4182 Altered mental status, unspecified: Secondary | ICD-10-CM | POA: Diagnosis not present

## 2020-01-02 DIAGNOSIS — I10 Essential (primary) hypertension: Secondary | ICD-10-CM

## 2020-01-02 DIAGNOSIS — Z9889 Other specified postprocedural states: Secondary | ICD-10-CM | POA: Diagnosis not present

## 2020-01-02 DIAGNOSIS — R269 Unspecified abnormalities of gait and mobility: Secondary | ICD-10-CM | POA: Diagnosis present

## 2020-01-02 DIAGNOSIS — R5381 Other malaise: Secondary | ICD-10-CM

## 2020-01-02 DIAGNOSIS — F17210 Nicotine dependence, cigarettes, uncomplicated: Secondary | ICD-10-CM | POA: Diagnosis present

## 2020-01-02 DIAGNOSIS — I509 Heart failure, unspecified: Secondary | ICD-10-CM | POA: Diagnosis not present

## 2020-01-02 DIAGNOSIS — R29818 Other symptoms and signs involving the nervous system: Secondary | ICD-10-CM | POA: Diagnosis not present

## 2020-01-02 DIAGNOSIS — F0280 Dementia in other diseases classified elsewhere without behavioral disturbance: Secondary | ICD-10-CM | POA: Diagnosis not present

## 2020-01-02 DIAGNOSIS — R54 Age-related physical debility: Secondary | ICD-10-CM | POA: Diagnosis present

## 2020-01-02 DIAGNOSIS — I6789 Other cerebrovascular disease: Secondary | ICD-10-CM | POA: Diagnosis not present

## 2020-01-02 DIAGNOSIS — Z9841 Cataract extraction status, right eye: Secondary | ICD-10-CM

## 2020-01-02 DIAGNOSIS — Z7401 Bed confinement status: Secondary | ICD-10-CM | POA: Diagnosis not present

## 2020-01-02 DIAGNOSIS — Z7902 Long term (current) use of antithrombotics/antiplatelets: Secondary | ICD-10-CM

## 2020-01-02 DIAGNOSIS — I69322 Dysarthria following cerebral infarction: Secondary | ICD-10-CM

## 2020-01-02 DIAGNOSIS — J9601 Acute respiratory failure with hypoxia: Secondary | ICD-10-CM | POA: Diagnosis not present

## 2020-01-02 DIAGNOSIS — Z87442 Personal history of urinary calculi: Secondary | ICD-10-CM | POA: Diagnosis not present

## 2020-01-02 DIAGNOSIS — Z23 Encounter for immunization: Secondary | ICD-10-CM

## 2020-01-02 DIAGNOSIS — J9811 Atelectasis: Secondary | ICD-10-CM | POA: Diagnosis not present

## 2020-01-02 DIAGNOSIS — R471 Dysarthria and anarthria: Secondary | ICD-10-CM | POA: Diagnosis not present

## 2020-01-02 DIAGNOSIS — G9341 Metabolic encephalopathy: Secondary | ICD-10-CM | POA: Diagnosis not present

## 2020-01-02 DIAGNOSIS — F419 Anxiety disorder, unspecified: Secondary | ICD-10-CM | POA: Diagnosis present

## 2020-01-02 DIAGNOSIS — R32 Unspecified urinary incontinence: Secondary | ICD-10-CM | POA: Diagnosis not present

## 2020-01-02 DIAGNOSIS — M2550 Pain in unspecified joint: Secondary | ICD-10-CM | POA: Diagnosis not present

## 2020-01-02 DIAGNOSIS — J449 Chronic obstructive pulmonary disease, unspecified: Secondary | ICD-10-CM | POA: Diagnosis present

## 2020-01-02 DIAGNOSIS — D631 Anemia in chronic kidney disease: Secondary | ICD-10-CM | POA: Diagnosis present

## 2020-01-02 DIAGNOSIS — I454 Nonspecific intraventricular block: Secondary | ICD-10-CM | POA: Diagnosis present

## 2020-01-02 DIAGNOSIS — R531 Weakness: Secondary | ICD-10-CM | POA: Diagnosis not present

## 2020-01-02 DIAGNOSIS — Z7982 Long term (current) use of aspirin: Secondary | ICD-10-CM | POA: Diagnosis not present

## 2020-01-02 DIAGNOSIS — Z978 Presence of other specified devices: Secondary | ICD-10-CM | POA: Diagnosis not present

## 2020-01-02 DIAGNOSIS — I6389 Other cerebral infarction: Secondary | ICD-10-CM | POA: Diagnosis not present

## 2020-01-02 DIAGNOSIS — G319 Degenerative disease of nervous system, unspecified: Secondary | ICD-10-CM | POA: Diagnosis not present

## 2020-01-02 DIAGNOSIS — G9340 Encephalopathy, unspecified: Secondary | ICD-10-CM | POA: Diagnosis not present

## 2020-01-02 DIAGNOSIS — E46 Unspecified protein-calorie malnutrition: Secondary | ICD-10-CM

## 2020-01-02 DIAGNOSIS — I69313 Psychomotor deficit following cerebral infarction: Secondary | ICD-10-CM | POA: Diagnosis not present

## 2020-01-02 DIAGNOSIS — Z79899 Other long term (current) drug therapy: Secondary | ICD-10-CM

## 2020-01-02 DIAGNOSIS — J80 Acute respiratory distress syndrome: Secondary | ICD-10-CM | POA: Diagnosis not present

## 2020-01-02 DIAGNOSIS — J939 Pneumothorax, unspecified: Secondary | ICD-10-CM

## 2020-01-02 DIAGNOSIS — F32A Depression, unspecified: Secondary | ICD-10-CM | POA: Diagnosis present

## 2020-01-02 LAB — CBC WITH DIFFERENTIAL/PLATELET
Abs Immature Granulocytes: 0.21 10*3/uL — ABNORMAL HIGH (ref 0.00–0.07)
Basophils Absolute: 0.1 10*3/uL (ref 0.0–0.1)
Basophils Relative: 1 %
Eosinophils Absolute: 0.3 10*3/uL (ref 0.0–0.5)
Eosinophils Relative: 2 %
HCT: 37.5 % — ABNORMAL LOW (ref 39.0–52.0)
Hemoglobin: 11.6 g/dL — ABNORMAL LOW (ref 13.0–17.0)
Immature Granulocytes: 1 %
Lymphocytes Relative: 8 %
Lymphs Abs: 1.2 10*3/uL (ref 0.7–4.0)
MCH: 25.5 pg — ABNORMAL LOW (ref 26.0–34.0)
MCHC: 30.9 g/dL (ref 30.0–36.0)
MCV: 82.4 fL (ref 80.0–100.0)
Monocytes Absolute: 1.6 10*3/uL — ABNORMAL HIGH (ref 0.1–1.0)
Monocytes Relative: 11 %
Neutro Abs: 11.6 10*3/uL — ABNORMAL HIGH (ref 1.7–7.7)
Neutrophils Relative %: 77 %
Platelets: 374 10*3/uL (ref 150–400)
RBC: 4.55 MIL/uL (ref 4.22–5.81)
RDW: 13.2 % (ref 11.5–15.5)
WBC: 15 10*3/uL — ABNORMAL HIGH (ref 4.0–10.5)
nRBC: 0 % (ref 0.0–0.2)

## 2020-01-02 LAB — BASIC METABOLIC PANEL
Anion gap: 9 (ref 5–15)
BUN: 17 mg/dL (ref 8–23)
CO2: 27 mmol/L (ref 22–32)
Calcium: 8.2 mg/dL — ABNORMAL LOW (ref 8.9–10.3)
Chloride: 100 mmol/L (ref 98–111)
Creatinine, Ser: 0.92 mg/dL (ref 0.61–1.24)
GFR, Estimated: >60 mL/min (ref >60)
Glucose, Bld: 97 mg/dL (ref 70–99)
Potassium: 4.1 mmol/L (ref 3.5–5.1)
Sodium: 136 mmol/L (ref 135–145)

## 2020-01-02 LAB — MAGNESIUM: Magnesium: 2 mg/dL (ref 1.7–2.4)

## 2020-01-02 MED ORDER — ENOXAPARIN SODIUM 40 MG/0.4ML ~~LOC~~ SOLN
40.0000 mg | Freq: Every day | SUBCUTANEOUS | Status: DC
  Administered 2020-01-03 – 2020-03-19 (×77): 40 mg via SUBCUTANEOUS
  Filled 2020-01-02 (×77): qty 0.4

## 2020-01-02 MED ORDER — DULOXETINE HCL 60 MG PO CPEP
60.0000 mg | ORAL_CAPSULE | Freq: Every day | ORAL | Status: DC
  Administered 2020-01-03 – 2020-03-19 (×77): 60 mg via ORAL
  Filled 2020-01-02 (×77): qty 1

## 2020-01-02 MED ORDER — SODIUM CHLORIDE 0.9 % IV SOLN
2.0000 g | INTRAVENOUS | Status: DC
Start: 2020-01-02 — End: 2020-03-19

## 2020-01-02 MED ORDER — ROSUVASTATIN CALCIUM 5 MG PO TABS
10.0000 mg | ORAL_TABLET | Freq: Every day | ORAL | Status: DC
  Administered 2020-01-03 – 2020-03-19 (×77): 10 mg via ORAL
  Filled 2020-01-02 (×79): qty 2

## 2020-01-02 MED ORDER — ASPIRIN EC 81 MG PO TBEC
81.0000 mg | DELAYED_RELEASE_TABLET | Freq: Every day | ORAL | Status: DC
  Administered 2020-01-03 – 2020-03-19 (×77): 81 mg via ORAL
  Filled 2020-01-02 (×77): qty 1

## 2020-01-02 MED ORDER — ENOXAPARIN SODIUM 40 MG/0.4ML ~~LOC~~ SOLN
40.0000 mg | SUBCUTANEOUS | Status: DC
Start: 2020-01-02 — End: 2020-03-19

## 2020-01-02 MED ORDER — CLOPIDOGREL BISULFATE 75 MG PO TABS
75.0000 mg | ORAL_TABLET | Freq: Every day | ORAL | Status: DC
  Administered 2020-01-03 – 2020-03-19 (×77): 75 mg via ORAL
  Filled 2020-01-02 (×77): qty 1

## 2020-01-02 MED ORDER — PIPERACILLIN-TAZOBACTAM 3.375 G IVPB
3.3750 g | Freq: Three times a day (TID) | INTRAVENOUS | Status: DC
  Administered 2020-01-03 – 2020-01-12 (×28): 3.375 g via INTRAVENOUS
  Filled 2020-01-02 (×27): qty 50

## 2020-01-02 NOTE — Plan of Care (Signed)
°  Problem: Education: Goal: Knowledge of General Education information will improve Description: Including pain rating scale, medication(s)/side effects and non-pharmacologic comfort measures Outcome: Progressing   Problem: Education: Goal: Knowledge of General Education information will improve Description: Including pain rating scale, medication(s)/side effects and non-pharmacologic comfort measures Outcome: Progressing   Problem: Education: Goal: Knowledge of General Education information will improve Description: Including pain rating scale, medication(s)/side effects and non-pharmacologic comfort measures Outcome: Progressing   Problem: Health Behavior/Discharge Planning: Goal: Ability to manage health-related needs will improve Outcome: Progressing   Problem: Clinical Measurements: Goal: Ability to maintain clinical measurements within normal limits will improve Outcome: Progressing Goal: Will remain free from infection Outcome: Progressing Goal: Diagnostic test results will improve Outcome: Progressing Goal: Respiratory complications will improve Outcome: Progressing Goal: Cardiovascular complication will be avoided Outcome: Progressing   Problem: Pain Managment: Goal: General experience of comfort will improve Outcome: Progressing

## 2020-01-02 NOTE — Progress Notes (Signed)
Patient arrived to room 6N05 via transport. Did not receive report from prior facility. Patient alert, oxygen in place per [facility], no distress observed. Patient oriented to room and call light. Will continue to monitor.

## 2020-01-02 NOTE — Progress Notes (Signed)
Attempted to call Mary(sister) at [phone], did not answer. Will try again later.

## 2020-01-02 NOTE — Progress Notes (Signed)
Sister called for update still waiting on bed at [facility] will call her as soon as we hear bed available. Pt denies needs

## 2020-01-02 NOTE — Care Management Important Message (Signed)
Important Message  Patient Details  Name: [name] [name] MRN: [id number] Date of Birth: September 22, 1940   Medicare Important Message Given:  Yes     Dannette Barbara 01/02/2020, 2:53 PM

## 2020-01-02 NOTE — H&P (Addendum)
History and Physical  [name] [name]:[id number] DOB: 1940/11/14 DOA: 01/02/2020  Referring physician: Dr. Roosevelt Locks, [facility], transferring provider from [facility]  PCP: [name], [name], MD  Outpatient Specialists:  Patient coming from: SNF through [facility] transfer.  Chief Complaint: Right-sided empyema.  HPI: [name] [name] is a 79 y.o. male with medical history significant for CVA in October 2021 with residual dysarthria, essential hypertension, hyperlipidemia, spinal stenosis who initially presented at [facility] from [facility] SNF due to confusion and right-sided pneumonia.  Work-up revealed right-sided loculated pleural effusion with concern for empyema.  On 01/02/2020 patient was seen by general surgery at [facility] with recommendation to transfer to [facility] for CTS evaluation.  He was seen by interventional radiology at [facility], had an attempted ultrasound-guided chest tube placement.  IR was unable to place a chest tube due to the thickness of the pleural fluid.  Imaging consistent with right-sided empyema and evidence of possible pneumonia.  Per general surgery, he might need VATS decortication on the right side.  ED Course: Direct admit from [facility].  Review of Systems: Review of systems as noted in the HPI. All other systems reviewed and are negative.   Past Medical History:  Diagnosis Date  . Back pain   . Renal disorder    kidney stones  . Spinal stenosis, lumbar region, with neurogenic claudication 07/07/2014   Past Surgical History:  Procedure Laterality Date  . CATARACT EXTRACTION W/PHACO Left 08/22/2016   Procedure: CATARACT EXTRACTION PHACO AND INTRAOCULAR LENS PLACEMENT ([place]) Left;  Surgeon: [name], MD;  Location: [place];  Service: Ophthalmology;  Laterality: Left;  . CATARACT EXTRACTION W/PHACO Right 10/25/2016   Procedure: CATARACT EXTRACTION PHACO AND INTRAOCULAR LENS PLACEMENT ([place]) WITH ISTENT RIGHT  ;   Surgeon: [name], MD;  Location: [place];  Service: Ophthalmology;  Laterality: Right;  TOPICAL RIGHT  ISTENT   trabecular bypass stent was implanted/explanted.  . INGUINAL HERNIA REPAIR    . KNEE SURGERY Right   . NECK SURGERY      Social History:  reports that he has been smoking cigarettes. He has been smoking about 0.75 packs per day. He has never used smokeless tobacco. He reports that he does not drink alcohol and does not use drugs.   No Known Allergies  Family History  Problem Relation Age of Onset  . Diabetes Mother   . Diabetes Brother   . Diabetes Sister       Prior to Admission medications   Medication Sig Start Date End Date Taking? Authorizing Provider  ARTIFICIAL TEARS 0.2-0.2-1 % SOLN Place 2 drops into both eyes 2 (two) times daily. 11/15/19   [provider]  aspirin EC 81 MG tablet Take 81 mg by mouth daily. Swallow whole.    [provider]  cefTRIAXone 2 g in sodium chloride 0.9 % 100 mL Inject 2 g into the vein daily. 01/02/20   [name], MD  clopidogrel (PLAVIX) 75 MG tablet Take 1 tablet (75 mg total) by mouth daily. 11/08/19   [name], MD  DULoxetine (CYMBALTA) 60 MG capsule Take 1 capsule (60 mg total) by mouth daily. 10/15/19   [name], [name], MD  enoxaparin (LOVENOX) 40 MG/0.4ML injection Inject 0.4 mLs (40 mg total) into the skin daily. 01/02/20   [name], MD  feeding supplement, ENSURE ENLIVE, (ENSURE ENLIVE) LIQD Take 237 mLs by mouth 3 (three) times daily between meals. 11/07/19   [name], MD  furosemide (LASIX) 20 MG tablet 2 tabs daily for 5 days then 1-2 tabs daily as needed for leg swelling. 10/16/19   [name], [name], MD  guaifenesin (ROBITUSSIN) 100 MG/5ML syrup Take 200 mg by mouth 3 (three) times daily as needed for cough.    [provider]  losartan (COZAAR) 50 MG tablet Take 1 tablet (50 mg total) by mouth daily. 10/15/19   [name], [name], MD  nicotine (NICODERM CQ - DOSED  IN MG/24 HOURS) 21 mg/24hr patch Place 1 patch (21 mg total) onto the skin daily. 09/23/19   [name], MD  Oxycodone HCl 10 MG TABS Take 1 tablet (10 mg total) by mouth 2 (two) times daily as needed (for moderate - severe pain). 10/16/19   [name], [name], MD  polyethylene glycol (MIRALAX / GLYCOLAX) 17 g packet Take 34 g by mouth 2 (two) times daily. 09/22/19   [name], MD  potassium chloride SA (KLOR-CON) 20 MEQ tablet Take 1 tablet (20 mEq total) by mouth daily as needed (When taking fluid pill.). 10/16/19   [name], [name], MD  rosuvastatin (CRESTOR) 10 MG tablet Take 1 tablet (10 mg total) by mouth daily. 10/16/19   [name], [name], MD    Physical Exam: There were no vitals taken for this visit.  . General: 79 y.o. year-old male well developed well nourished in no acute distress.  Alert and oriented x2.  He thought the year was 2002. . Cardiovascular: Regular rate and rhythm with no rubs or gallops.  No thyromegaly or JVD noted.  No lower extremity edema. 2/4 pulses in all 4 extremities. [name] Respiratory: Mild rales at bases no wheezing noted. Good inspiratory effort. . Abdomen: Soft nontender nondistended with normal bowel sounds x4 quadrants. . Muskuloskeletal: No cyanosis, clubbing or edema noted bilaterally . Neuro: CN II-XII intact, strength, sensation, reflexes . Skin: No ulcerative lesions noted or rashes . Psychiatry: Judgement and insight appear normal. Mood is appropriate for condition and setting          Labs on Admission:  Basic Metabolic Panel: Recent Labs  Lab 12/29/19 0348 12/30/19 0344 12/31/19 0550 01/01/20 0357 01/02/20 0359  NA 137 137 138 136 136  K 4.6 4.1 4.1 4.2 4.1  CL 103 102 100 101 100  CO2 24 23 27 27 27   GLUCOSE 118* 110* 117* 119* 97  BUN 27* 22 22 19 17   CREATININE 1.05 0.99 1.11 0.87 0.92  CALCIUM 8.4* 8.4* 8.4* 8.3* 8.2*  MG  --   --   --   --  2.0   Liver Function Tests: No results for input(s): AST, ALT, ALKPHOS, BILITOT, PROT, ALBUMIN in  the last 168 hours. No results for input(s): LIPASE, AMYLASE in the last 168 hours. No results for input(s): AMMONIA in the last 168 hours. CBC: Recent Labs  Lab 12/29/19 0348 12/30/19 0344 12/31/19 0550 01/01/20 0357 01/02/20 0359  WBC 18.4* 15.4* 15.4* 15.9* 15.0*  NEUTROABS  --   --   --   --  11.6*  HGB 11.4* 11.0* 11.6* 11.5* 11.6*  HCT 35.5* 35.1* 36.4* 37.3* 37.5*  MCV 81.2 82.4 81.4 83.3 82.4  PLT 349 327 333 319 374   Cardiac Enzymes: No results for input(s): CKTOTAL, CKMB, CKMBINDEX, TROPONINI in the last 168 hours.  BNP (last 3 results) Recent Labs    12/26/19 1510 01/01/20 1018  BNP 88.7 177.1*    ProBNP (last 3 results) No results for input(s): PROBNP in the last 8760  hours.  CBG: No results for input(s): GLUCAP in the last 168 hours.  Radiological Exams on Admission: DG Chest 2 View  Result Date: 01/01/2020 CLINICAL DATA:  Altered mental status, lobar pneumonia. EXAM: CHEST - 2 VIEW COMPARISON:  December 26, 2019. FINDINGS: Stable cardiomediastinal silhouette. No pneumothorax is noted. Left lung is clear. Moderate size right pleural effusion is noted with associated right basilar atelectasis or infiltrate. Bony thorax is unremarkable. IMPRESSION: Moderate size right pleural effusion with associated right basilar atelectasis or infiltrate. Electronically Signed   By: Marijo Conception M.D.   On: 01/01/2020 08:22   CT CHEST W CONTRAST  Result Date: 01/01/2020 CLINICAL DATA:  Abnormal chest radiograph.  Short of breath. EXAM: CT CHEST WITH CONTRAST TECHNIQUE: Multidetector CT imaging of the chest was performed during intravenous contrast administration. CONTRAST:  52mL OMNIPAQUE IOHEXOL 300 MG/ML  SOLN COMPARISON:  Current chest radiograph.  CT, 03/07/2005. FINDINGS: Cardiovascular: Heart is normal in size. No pericardial effusion. Three-vessel coronary artery calcifications. Aorta is normal in caliber. Minor atherosclerosis. Mediastinum/Nodes: Normal thyroid. No  neck base, axillary, mediastinal or hilar masses or enlarged lymph nodes. Trachea and esophagus are unremarkable. Lungs/Pleura: Moderate size loculated right pleural effusion. Minimal left pleural effusion. Consolidation/atelectasis of the majority of the right lower lobe. Mild dependent atelectasis in the right middle lobe. Peripheral linear/reticular opacities in the dependent right upper lobe, also likely atelectasis. Non dependent aspects of the right upper and middle lobes are clear. Minor dependent atelectasis in the left lower lobe. Remainder of the left lung is clear. Mild emphysema. No pneumothorax. Upper Abdomen: No acute findings. Musculoskeletal: No fracture or acute finding. No bone lesion. No chest wall mass. IMPRESSION: 1. Moderate-size loculated right pleural effusion, which may reflect an empyema. Most of the right lower lobe is opacified consistent with pneumonia, atelectasis or a combination. 2. Additional mild dependent linear/reticular atelectasis in the right upper and middle lobes and left lower lobe. Trace left pleural effusion. 3. No visualized centrally obstructing mass. 4. Emphysema and mild aortic atherosclerosis. Aortic Atherosclerosis (ICD10-I70.0) and Emphysema (ICD10-J43.9). Electronically Signed   By: Lajean Manes M.D.   On: 01/01/2020 17:06   [place] CHEST (PLEURAL EFFUSION)  Result Date: 01/01/2020 CLINICAL DATA:  Please perform chest ultrasound an ultrasound-guided thoracentesis as indicated. EXAM: CHEST ULTRASOUND COMPARISON:  Chest radiograph-01/01/2020; 12/26/2019 FINDINGS: Sonographic evaluation of the right hemithorax demonstrates a moderate sized densely loculated and complex pleural effusion not amenable to ultrasound-guided thoracentesis. Note is made of a trace left-sided pleural effusion, too small to allow for safe ultrasound-guided thoracentesis. No thoracentesis attempted. IMPRESSION: 1. Moderate sized densely loculated and complex right-sided pleural effusion not  amenable to ultrasound-guided thoracentesis. 2. Trace left-sided pleural effusion, too small to allow for safe ultrasound-guided thoracentesis. PLAN: Recommend contrast-enhanced chest CT which, pending the results, may warrant cardiothoracic surgery evaluation for consideration of chest tube versus definitive VATS. Above discussed with Dr. Roosevelt Locks at the time of soft tissue chest ultrasound completion. Electronically Signed   By: Sandi Mariscal M.D.   On: 01/01/2020 12:52    EKG: I independently viewed the EKG done and my findings are as followed: None available at the time of this visit.  Assessment/Plan Present on Admission: . Empyema of lung ([place])  Active Problems:   Empyema of lung ([place])  Right-sided parapneumonic effusion/empyema Received Rocephin at [place] on 01/02/20  WBC 15,000 Monitor fever curve and WBC Obtain CBC with differential in the morning Obtain sputum culture Seen by general surgery at [facility], recommended  CTS evaluation for possible VATS decortication on the right side. Consult CTS in the morning  Acute hypoxic respiratory failure secondary to right-sided empyema Currently on 3 L Not on oxygen supplementation at baseline. Maintain O2 saturation greater than 92% Wean off oxygen supplementation as tolerated after assessment by CTS.  Recent CVA in October 2021 He is on dual antiplatelets aspirin and Plavix at home, restart Resume home Crestor  Chronic anxiety/depression Resume home duloxetine  Essential hypertension BP is at goal He is on losartan and p.o. Lasix PTA Monitor vital signs Resume home oral antihypertensive as appropriate.  Resolved acute metabolic encephalopathy/dementia He appears to be back to his baseline mentation. Reorient as needed  DVT prophylaxis: Subcu Lovenox daily  Code Status: Full code.  Family Communication: None at bedside.  Disposition Plan: Admit to telemetry medical.  Consults called: Consult CTS in the  morning.  Admission status: Inpatient status.  Patient will require at least 2 midnights for further evaluation and treatment of present condition.   Status is: Inpatient    Dispo:  Patient From: [place]  Planned Disposition: [place]  Expected discharge date: 01/06/20  Medically stable for discharge: No, ongoing management of right-sided empyema.        Kayleen Memos MD [facility] Pager [phone]  If 7PM-7AM, please contact night-coverage [email or web address] Password [facility]  01/02/2020, 10:52 PM

## 2020-01-02 NOTE — Consult Note (Signed)
Patient ID: [name] [name], male   DOB: 07-Mar-1940, 79 y.o.   MRN: [id number]  HPI [name] [name] is a 79 y.o. male seen in consultation at the request of Dr.Zhang for a complex loculated parapneumonic effusion on the right side.  Patient was admitted 6 days ago from nursing home due to failure to thrive respiratory failure parapneumonic effusion.  He also had metabolic encephalopathy at this time.  His hypoxic respiratory failure has improved and currently he is only on nasal cannula.  He was placed on broad-spectrum antibiotics to include Rocephin and azithromycin.  Current blood cultures are negative.  He had a CT scan as well as an attempted ultrasound guided chest tube placement.  I have personally reviewed the images.  There is evidence of a significant right pleural effusion that is complex with significant loculations consistent with an empyema.  There is evidence also of a pneumonia.  Interventional radiology was unable to place a chest tube due to the thickness. He had a recent stroke and is on aspirin and Plavix.  Laboratory values include a white count of 15,000 hemoglobin 11 platelets of 374,000.  BMP is completely normal.  INR is normal. He lives in a nursing home.  He is able to converse for very short sentences and answer simple questions.  He does have baseline dysarthria and some dementia.  Is currently oriented he knows he is at [facility] in the hospital.  I also discussed with him if he was willing to proceed with possible intervention such as a VATS pleurodesis and he seems to agree with this although I have doubts that he fully comprehends the full scope of what this entails.   HPI  Past Medical History:  Diagnosis Date  . Back pain   . Renal disorder    kidney stones  . Spinal stenosis, lumbar region, with neurogenic claudication 07/07/2014    Past Surgical History:  Procedure Laterality Date  . CATARACT EXTRACTION W/PHACO Left 08/22/2016   Procedure: CATARACT  EXTRACTION PHACO AND INTRAOCULAR LENS PLACEMENT ([place]) Left;  Surgeon: [name], MD;  Location: [place];  Service: Ophthalmology;  Laterality: Left;  . CATARACT EXTRACTION W/PHACO Right 10/25/2016   Procedure: CATARACT EXTRACTION PHACO AND INTRAOCULAR LENS PLACEMENT ([place]) WITH ISTENT RIGHT  ;  Surgeon: [name], MD;  Location: [place];  Service: Ophthalmology;  Laterality: Right;  TOPICAL RIGHT  ISTENT   trabecular bypass stent was implanted/explanted.  . INGUINAL HERNIA REPAIR    . KNEE SURGERY Right   . NECK SURGERY      Family History  Problem Relation Age of Onset  . Diabetes Mother   . Diabetes Brother   . Diabetes Sister     Social History Social History   Tobacco Use  . Smoking status: Current Every Day Smoker    Packs/day: 0.75    Types: Cigarettes  . Smokeless tobacco: Never Used  Vaping Use  . Vaping Use: Never used  Substance Use Topics  . Alcohol use: No  . Drug use: Never    No Known Allergies  Current Facility-Administered Medications  Medication Dose Route Frequency Provider Last Rate Last Admin  . 0.9 %  sodium chloride infusion   Intravenous PRN Wyvonnia Dusky, MD   Stopped at 01/01/20 1317  . acetaminophen (TYLENOL) tablet 650 mg  650 mg Oral Q6H PRN Samuella Cota, MD   650 mg at 01/02/20 0354   Or  . acetaminophen (TYLENOL) suppository 650 mg  650 mg Rectal Q6H PRN Samuella Cota, MD      . aspirin EC tablet 81 mg  81 mg Oral Daily Samuella Cota, MD   81 mg at 01/01/20 0919  . cefTRIAXone (ROCEPHIN) 2 g in sodium chloride 0.9 % 100 mL IVPB  2 g Intravenous Q24H [name], MD   Stopped at 01/01/20 1227  . clopidogrel (PLAVIX) tablet 75 mg  75 mg Oral Daily Samuella Cota, MD   75 mg at 01/01/20 0919  . enoxaparin (LOVENOX) injection 40 mg  40 mg Subcutaneous Q24H Samuella Cota, MD   40 mg at 01/01/20 2258  . feeding supplement (ENSURE ENLIVE / ENSURE PLUS) liquid 237 mL  237 mL  Oral BID BM Wyvonnia Dusky, MD   237 mL at 01/01/20 1201  . guaiFENesin-dextromethorphan (ROBITUSSIN DM) 100-10 MG/5ML syrup 5 mL  5 mL Oral Q4H PRN Wyvonnia Dusky, MD   5 mL at 12/28/19 1019  . ipratropium-albuterol (DUONEB) 0.5-2.5 (3) MG/3ML nebulizer solution 3 mL  3 mL Nebulization Q6H PRN Wyvonnia Dusky, MD   3 mL at 12/28/19 1104  . lidocaine (LIDODERM) 5 % 1 patch  1 patch Transdermal Q24H Wyvonnia Dusky, MD   1 patch at 01/01/20 0920  . ondansetron (ZOFRAN) tablet 4 mg  4 mg Oral Q6H PRN Samuella Cota, MD       Or  . ondansetron [facility]) injection 4 mg  4 mg Intravenous Q6H PRN Samuella Cota, MD      . polyethylene glycol (MIRALAX / GLYCOLAX) packet 34 g  34 g Oral BID Samuella Cota, MD   34 g at 01/01/20 2258  . polyvinyl alcohol (LIQUIFILM TEARS) 1.4 % ophthalmic solution 1 drop  1 drop Both Eyes BID Samuella Cota, MD   1 drop at 01/01/20 2258  . rosuvastatin (CRESTOR) tablet 10 mg  10 mg Oral Daily Samuella Cota, MD   10 mg at 01/01/20 0919  . senna-docusate (Senokot-S) tablet 2 tablet  2 tablet Oral BID [name], MD   2 tablet at 01/01/20 2258  . sodium chloride flush (NS) 0.9 % injection 3 mL  3 mL Intravenous Q12H Samuella Cota, MD   3 mL at 01/01/20 2310     Review of Systems Full ROS was not possible to obtain due to dysarthria and some cognitive deficits  Physical Exam Blood pressure 124/68, pulse 62, temperature 97.6 F (36.4 C), temperature source Oral, resp. rate 15, height 5\' 10"  (1.778 m), weight 66.8 kg, SpO2 100 %. CONSTITUTIONAL: Debilitated and malnourished EYES: Pupils are equal, round, and reactive to light, Sclera are non-icteric. EARS, NOSE, MOUTH AND THROAT: The oropharynx is clear. The oral mucosa is pink and moist. Hearing is intact to voice. LYMPH NODES:  Lymph nodes in the neck are normal. RESPIRATORY:  Lungs sounds are decreased on the right side, without pathologic use of accessory  muscles. CARDIOVASCULAR: Heart is regular without murmurs, gallops, or rubs. GI: The abdomen is  soft, nontender, and nondistended. There are no palpable masses. There is no hepatosplenomegaly. There are normal bowel sounds in all quadrants. GU: Rectal deferred.   MUSCULOSKELETAL: Normal muscle strength and tone. No cyanosis or edema.   SKIN: Turgor is good and there are no pathologic skin lesions or ulcers. NEUROLOGIC: Patient is nonfocal.  Significant deconditioning.  He does have some dysarthria PSYCH:  Oriented to person, place and time. Affect is normal.  Data Reviewed I have  personally reviewed the patient's imaging, laboratory findings and medical records.    Assessment/Plan  79 year old male with multiple comorbidities including history of stroke with residual dysarthria some cognitive deficits, hypertension and failure to thrive admitted for a right pneumonia with what seems to be an empyema.  Collection able to be tapped or thoracostomy tube placed. Patient seems to be competent however he has significant cognitive issues.  I think he is willing to undergo surgery but I am not necessarily sure that he fully understands the situation and the risks. I recommend that the patient will be transferred to a facility where thoracic surgery is available as I do think that he may need a VATS decortication on the right side. Dr. Genevive Bi who is our thoracic surgeon is on medical leave and will not be able to assist in the care of this patient He is currently not showing any signs of acute distress that will mandate an immediate chest tube placement. Discussed with Dr. Roosevelt Locks in detail.      Caroleen Hamman, MD FACS General Surgeon 01/02/2020, 10:15 AM

## 2020-01-02 NOTE — Progress Notes (Signed)
[facility] called stating that patient has a bed ready at [facility]. 6N, Rm: 5. Sister [name] [name] has been informed. EMTLA paperwork is complete.   [name] [name]  A and O x 4 VSS. Pt tolerating diet well. No complaints of pain or nausea. IV removed intact, prescriptions given. Pt voices understanding of discharge instructions with no further questions. Pt discharged via [place].  Allergies as of 01/02/2020   No Known Allergies     Medication List    STOP taking these medications   amoxicillin-clavulanate 875-125 MG tablet Commonly known as: AUGMENTIN     TAKE these medications   Artificial Tears 0.2-0.2-1 % Soln Generic drug: Glycerin-Hypromellose-PEG 400 Place 2 drops into both eyes 2 (two) times daily.   aspirin EC 81 MG tablet Take 81 mg by mouth daily. Swallow whole.   cefTRIAXone 2 g in sodium chloride 0.9 % 100 mL Inject 2 g into the vein daily.   clopidogrel 75 MG tablet Commonly known as: PLAVIX Take 1 tablet (75 mg total) by mouth daily.   DULoxetine 60 MG capsule Commonly known as: Cymbalta Take 1 capsule (60 mg total) by mouth daily.   enoxaparin 40 MG/0.4ML injection Commonly known as: LOVENOX Inject 0.4 mLs (40 mg total) into the skin daily.   feeding supplement Liqd Take 237 mLs by mouth 3 (three) times daily between meals.   furosemide 20 MG tablet Commonly known as: LASIX 2 tabs daily for 5 days then 1-2 tabs daily as needed for leg swelling.   guaifenesin 100 MG/5ML syrup Commonly known as: ROBITUSSIN Take 200 mg by mouth 3 (three) times daily as needed for cough.   losartan 50 MG tablet Commonly known as: COZAAR Take 1 tablet (50 mg total) by mouth daily.   nicotine 21 mg/24hr patch Commonly known as: NICODERM CQ - dosed in mg/24 hours Place 1 patch (21 mg total) onto the skin daily.   Oxycodone HCl 10 MG Tabs Take 1 tablet (10 mg total) by mouth 2 (two) times daily as needed (for moderate - severe pain).   polyethylene glycol  17 g packet Commonly known as: MIRALAX / GLYCOLAX Take 34 g by mouth 2 (two) times daily.   potassium chloride SA 20 MEQ tablet Commonly known as: KLOR-CON Take 1 tablet (20 mEq total) by mouth daily as needed (When taking fluid pill.).   rosuvastatin 10 MG tablet Commonly known as: Crestor Take 1 tablet (10 mg total) by mouth daily.       Vitals:   01/02/20 2000 01/02/20 2112  BP: 138/83 135/84  Pulse: 86 81  Resp: 16 16  Temp: 99 F (37.2 C) 98.8 F (37.1 C)  SpO2: 99% 99%    [name] [name]

## 2020-01-02 NOTE — Discharge Summary (Signed)
Physician Discharge Summary  Patient ID: [name] [name] MRN: [id number] DOB/AGE: Jan 21, 1941 79 y.o.  Admit date: 12/26/2019 Discharge date: 01/02/2020  Admission Diagnoses:  Discharge Diagnoses:  Principal Problem:   Lobar pneumonia ([place]) Active Problems:   Spinal stenosis, lumbar region, with neurogenic claudication   Chronic pain disorder   AKI (acute kidney injury) ([place])   CVA (cerebral vascular accident) ([place])   Acute hypoxemic respiratory failure ([place])   Dysphagia   Acute encephalopathy   Malnutrition of moderate degree   Discharged Condition: fair  Hospital Course:  79 year old man PMHlacunar infarct October 2021 with residual dysarthria, hypertension, hyperlipidemia, spinal stenosis [facility]. Work-up in the emergency department revealed right middle and right lower lobe pneumonia and acute hypoxic respiratory failure.  Patient is a placed on Rocephin and azithromycin.  Blood cultures are negative.  Patient is also evaluate by speech therapy, placed on dysphagia 2 diet.  12/1.  Patient still on oxygen, repeated chest x-ray showed an increased right sided parapneumonic effusion.  Obtain thoracentesis.  Continue Rocephin for now.  12/2.  CT scan showed loculated moderate amount of pleural effusion on the right side.  Not able to perform thoracentesis due to very thick fluid in pleural effusion.  #1.  Acute hypoxemic respiratory failure. Right middle and lower lobe pneumonia. Right-sided parapneumonic effusion. Patient overall condition is improving.  Still on 3 L oxygen.   Patient has been evaluated by speech therapy, on dysphagia 2 diet, no evidence of continued aspiration. CT chest with contrast showed loculated right-sided pleural effusion consistent with empyema.  CT surgery is not available, discussed with general surgery, recommended transfer patient to [facility]. Continue Rocephin for now.  #2.  Acute metabolic encephalopathy. Dementia. Patient still has some confusion.  Continue supportive care.  3.  Acute kidney injury on chronic kidney disease stage II. Renal function improved. Reviewed the previous lab over the past year, GFR > 60 at baseline.  4.  History of CVA with residual dysphagia.  Dysarthria. Seen by speech therapy, no evidence of aspiration.  Consults: general surgery  Significant Diagnostic Studies: CT CHEST WITH CONTRAST  TECHNIQUE: Multidetector CT imaging of the chest was performed during intravenous contrast administration.  CONTRAST:  50mL OMNIPAQUE IOHEXOL 300 MG/ML  SOLN  COMPARISON:  Current chest radiograph.  CT, 03/07/2005.  FINDINGS: Cardiovascular: Heart is normal in size. No pericardial effusion. Three-vessel coronary artery calcifications. Aorta is normal in caliber. Minor atherosclerosis.  Mediastinum/Nodes: Normal thyroid. No neck base, axillary, mediastinal or hilar masses or enlarged lymph nodes. Trachea and esophagus are unremarkable.  Lungs/Pleura: Moderate size loculated right pleural effusion. Minimal left pleural effusion.  Consolidation/atelectasis of the majority of the right lower lobe. Mild dependent atelectasis in the right middle lobe. Peripheral linear/reticular opacities in the dependent right upper lobe, also likely atelectasis. Non dependent aspects of the right upper and middle lobes are clear. Minor dependent atelectasis in the left lower lobe. Remainder of the left lung is clear. Mild emphysema.  No pneumothorax.  Upper Abdomen: No acute findings.  Musculoskeletal: No fracture or acute finding. No bone lesion. No chest wall mass.  IMPRESSION: 1. Moderate-size loculated right pleural effusion, which may reflect an empyema. Most of the right lower lobe is opacified consistent with pneumonia, atelectasis or a combination. 2. Additional mild  dependent linear/reticular atelectasis in the right upper and middle lobes and left lower lobe. Trace left pleural effusion. 3. No visualized centrally  obstructing mass. 4. Emphysema and mild aortic atherosclerosis.  Aortic Atherosclerosis (ICD10-I70.0) and Emphysema (ICD10-J43.9).   Electronically Signed   By: Lajean Manes M.D.   On: 01/01/2020 17:06   Treatments: Rocephin and Zithromax  Discharge Exam: Blood pressure 124/68, pulse 62, temperature 97.6 F (36.4 C), temperature source Oral, resp. rate 15, height 5\' 10"  (1.778 m), weight 66.8 kg, SpO2 100 %. General appearance: alert, cooperative and Oriented to place and person. Resp: Decreased breathing sound on the right side. Cardio: regular rate and rhythm, S1, S2 normal, no murmur, click, rub or gallop GI: soft, non-tender; bowel sounds normal; no masses,  no organomegaly Extremities: extremities normal, atraumatic, no cyanosis or edema  Disposition: Discharge disposition: 02-Transferred to [facility]       Discharge Instructions    Diet - low sodium heart healthy   Complete by: As directed    Increase activity slowly   Complete by: As directed      Allergies as of 01/02/2020   No Known Allergies     Medication List    STOP taking these medications   amoxicillin-clavulanate 875-125 MG tablet Commonly known as: AUGMENTIN     TAKE these medications   Artificial Tears 0.2-0.2-1 % Soln Generic drug: Glycerin-Hypromellose-PEG 400 Place 2 drops into both eyes 2 (two) times daily.   aspirin EC 81 MG tablet Take 81 mg by mouth daily. Swallow whole.   cefTRIAXone 2 g in sodium chloride 0.9 % 100 mL Inject 2 g into the vein daily.   clopidogrel 75 MG tablet Commonly known as: PLAVIX Take 1 tablet (75 mg total) by mouth daily.   DULoxetine 60 MG capsule Commonly known as: Cymbalta Take 1 capsule (60 mg total) by mouth daily.   enoxaparin 40 MG/0.4ML injection Commonly known as:  LOVENOX Inject 0.4 mLs (40 mg total) into the skin daily.   feeding supplement Liqd Take 237 mLs by mouth 3 (three) times daily between meals.   furosemide 20 MG tablet Commonly known as: LASIX 2 tabs daily for 5 days then 1-2 tabs daily as needed for leg swelling.   guaifenesin 100 MG/5ML syrup Commonly known as: ROBITUSSIN Take 200 mg by mouth 3 (three) times daily as needed for cough.   losartan 50 MG tablet Commonly known as: COZAAR Take 1 tablet (50 mg total) by mouth daily.   nicotine 21 mg/24hr patch Commonly known as: NICODERM CQ - dosed in mg/24 hours Place 1 patch (21 mg total) onto the skin daily.   Oxycodone HCl 10 MG Tabs Take 1 tablet (10 mg total) by mouth 2 (two) times daily as needed (for moderate - severe pain).   polyethylene glycol 17 g packet Commonly known as: MIRALAX / GLYCOLAX Take 34 g by mouth 2 (two) times daily.   potassium chloride SA 20 MEQ tablet Commonly known as: KLOR-CON Take 1 tablet (20 mEq total) by mouth daily as needed (When taking fluid pill.).   rosuvastatin 10 MG tablet Commonly known as: Crestor Take 1 tablet (10 mg total) by mouth daily.       Contact information for after-discharge care    Destination    [facility] [place] [facility] .   Service: Skilled Nursing Contact information: [address] [place] [phone]                 35 minutes Signed: [name] 01/02/2020, 9:41 AM

## 2020-01-02 NOTE — TOC Progression Note (Signed)
Transition of Care [facility]) - Progression Note    Patient Details  Name: [name] [name] MRN: [id number] Date of Birth: 1940/05/24  Transition [facility]) CM/SW [place], LCSW Phone Number: 01/02/2020, 10:20 AM  Clinical Narrative: [facility] unsure if they can accept him back due to issues with his Medicaid.    Expected Discharge Plan: [place] Barriers to Discharge: Ship broker, Continued Medical Work up  Expected Discharge Plan and Services Expected Discharge Plan: [place] Acute Care Choice: Resumption of Svcs/PTA Provider Living arrangements for the past 2 months: [place] Expected Discharge Date: 01/02/20                                     Social Determinants of Health (SDOH) Interventions    Readmission Risk Interventions No flowsheet data found.

## 2020-01-02 NOTE — Plan of Care (Signed)

## 2020-01-02 NOTE — Progress Notes (Signed)
Nutrition Follow Up Note   DOCUMENTATION CODES:   Non-severe (moderate) malnutrition in context of social or environmental circumstances  INTERVENTION:   Ensure Enlive po BID, each supplement provides 350 kcal and 20 grams of protein- Recommend increase to TID  Magic cup TID with meals, each supplement provides 290 kcal and 9 grams of protein  Recommend MVI daily   NUTRITION DIAGNOSIS:   Moderate Malnutrition related to social / environmental circumstances as evidenced by moderate muscle depletion, severe muscle depletion, moderate fat depletion, severe fat depletion. -ongoing  GOAL:   Patient will meet greater than or equal to 90% of their needs -progressing   MONITOR:   PO intake, Supplement acceptance, Labs, Weight trends, Skin, I & O's  ASSESSMENT:    79 year old male admitted for lobar pneumonia. Past medical history significant of lacunar infarct (10/21) with residual dysarthria, HTN, HLD, spinal stenosis presents from [facility] with confusion and reported diagnosis of pneumonia. Pt noted to have empyema.   Pt with fair appetite and oral intake in hospital; pt eating anywhere from sips/bites to 100% of meals. Pt eating <50% of the majority of his meals but he is drinking 2 Ensure per day and eating the Magic Cups being sent on his meal trays. Would recommend increasing Ensure to three times daily and adding a daily MVI; RD will hold off on making changes for now as discharge is pending. Plan is for transfer to [facility] for possible VATS; Interventional Radiology was unable to place a chest tube per MD note.   No new weight since admit; RD will request daily weights.   Medications reviewed and include: aspirin, plavix, lovenox, miralax, senokot, ceftriaxone   Labs reviewed: K 4.1 wnl, Mg 2.0 wnl Wbc- 15.0(H)  Diet Order:   Diet Order            Diet - low sodium heart healthy           DIET DYS 2 Room service appropriate? Yes; Fluid consistency: Thin  Diet  effective now                EDUCATION NEEDS:   No education needs have been identified at this time  Skin:  Skin Assessment: Reviewed RN Assessment  Last BM:  12/1- type 4  Height:   Ht Readings from Last 1 Encounters:  12/26/19 5\' 10"  (1.778 m)    Weight:   Wt Readings from Last 1 Encounters:  12/26/19 66.8 kg    BMI:  Body mass index is 21.13 kg/m.  Estimated Nutritional Needs:   Kcal:  1800-2000  Protein:  90-100  Fluid:  >1.6 L/day  Koleen Distance MS, RD, LDN Please refer to [facility] for RD and/or RD on-call/weekend/after hours pager

## 2020-01-02 NOTE — Progress Notes (Signed)
Mobility Specialist - Progress Note   01/02/20 1500  Mobility  Activity Transferred:  Bed to chair;Transferred:  Chair to bed;Sat and stood x 3  Level of Assistance Moderate assist, patient does 50-74%  Assistive Device Front wheel walker  Distance Ambulated (ft) 8 ft  Mobility Response Tolerated well  Mobility performed by Mobility specialist  $Mobility charge 1 Mobility    Pre-mobility: 96 HR, 98% SpO2 During mobility: 91 HR, 94% SpO2 Post-mobility: 93 HR, 100% SpO2   Pt was sleeping in bed upon arrival utilizing 3L  O2. Pt agreed to session. Pt denied any pain, nausea, or fatigue. Pt was able to get EOB with modA. Pt performed STSx3 from elevated bed height with minA. Noticed pt's bed soiled. Pt ambulated 2' over to chair with modA. Mobility changed bed pads. Pt stood with modA from chair and progressed to ambulating towards recliner. Pt pushed himself to perform STSx3 from recliner with mod-maxA. Mobility instructed pt to take a seated rest break as noted legs were beginning to buckle from activity. No LOB noted, however. Mobility educated pt on the importance of energy conservation. After extensive rest break, pt performed STSx2 before taking another rest break and heading back EOB. Pt returned to supine modA and c/o SOB. PLB utilized. Pt needed max cueing for redirection, sequencing, and hand placement to sit/stand. Pt would attempt to stand several times before it was safe to do so. Pt does not carry over commands well. Overall, pt tolerated session well. Pt was left in bed with all needs in reach and alarm set.    Kathee Delton Mobility Specialist 01/02/20, 3:52 PM

## 2020-01-03 ENCOUNTER — Encounter (HOSPITAL_COMMUNITY): Payer: Self-pay | Admitting: Internal Medicine

## 2020-01-03 ENCOUNTER — Other Ambulatory Visit: Payer: Self-pay

## 2020-01-03 ENCOUNTER — Inpatient Hospital Stay (HOSPITAL_COMMUNITY): Payer: Medicare HMO

## 2020-01-03 DIAGNOSIS — R4182 Altered mental status, unspecified: Secondary | ICD-10-CM

## 2020-01-03 DIAGNOSIS — J869 Pyothorax without fistula: Secondary | ICD-10-CM

## 2020-01-03 HISTORY — PX: IR PERC PLEURAL DRAIN W/INDWELL CATH W/IMG GUIDE: IMG5383

## 2020-01-03 LAB — CBC
HCT: 34.1 % — ABNORMAL LOW (ref 39.0–52.0)
Hemoglobin: 10.5 g/dL — ABNORMAL LOW (ref 13.0–17.0)
MCH: 25.4 pg — ABNORMAL LOW (ref 26.0–34.0)
MCHC: 30.8 g/dL (ref 30.0–36.0)
MCV: 82.4 fL (ref 80.0–100.0)
Platelets: 390 10*3/uL (ref 150–400)
RBC: 4.14 MIL/uL — ABNORMAL LOW (ref 4.22–5.81)
RDW: 13.1 % (ref 11.5–15.5)
WBC: 15.7 10*3/uL — ABNORMAL HIGH (ref 4.0–10.5)
nRBC: 0 % (ref 0.0–0.2)

## 2020-01-03 LAB — COMPREHENSIVE METABOLIC PANEL
ALT: 19 U/L (ref 0–44)
AST: 24 U/L (ref 15–41)
Albumin: 2 g/dL — ABNORMAL LOW (ref 3.5–5.0)
Alkaline Phosphatase: 65 U/L (ref 38–126)
Anion gap: 12 (ref 5–15)
BUN: 14 mg/dL (ref 8–23)
CO2: 25 mmol/L (ref 22–32)
Calcium: 8.3 mg/dL — ABNORMAL LOW (ref 8.9–10.3)
Chloride: 99 mmol/L (ref 98–111)
Creatinine, Ser: 1.07 mg/dL (ref 0.61–1.24)
GFR, Estimated: >60 mL/min (ref >60)
Glucose, Bld: 105 mg/dL — ABNORMAL HIGH (ref 70–99)
Potassium: 4 mmol/L (ref 3.5–5.1)
Sodium: 136 mmol/L (ref 135–145)
Total Bilirubin: 0.8 mg/dL (ref 0.3–1.2)
Total Protein: 6.4 g/dL — ABNORMAL LOW (ref 6.5–8.1)

## 2020-01-03 LAB — MRSA PCR SCREENING: MRSA by PCR: NEGATIVE

## 2020-01-03 MED ORDER — MIDAZOLAM HCL 2 MG/2ML IJ SOLN
INTRAMUSCULAR | Status: AC
  Filled 2020-01-03: qty 2

## 2020-01-03 MED ORDER — KETOROLAC TROMETHAMINE 15 MG/ML IJ SOLN
15.0000 mg | Freq: Once | INTRAMUSCULAR | Status: AC
  Administered 2020-01-03: 15 mg via INTRAVENOUS
  Filled 2020-01-03: qty 1

## 2020-01-03 MED ORDER — ENSURE ENLIVE PO LIQD
237.0000 mL | Freq: Three times a day (TID) | ORAL | Status: DC
  Administered 2020-01-04 – 2020-03-19 (×200): 237 mL via ORAL
  Filled 2020-01-03 (×11): qty 237

## 2020-01-03 MED ORDER — FENTANYL CITRATE (PF) 100 MCG/2ML IJ SOLN
INTRAMUSCULAR | Status: AC
  Filled 2020-01-03: qty 2

## 2020-01-03 MED ORDER — MIDAZOLAM HCL 2 MG/2ML IJ SOLN
INTRAMUSCULAR | Status: AC | PRN
  Administered 2020-01-03: 1 mg via INTRAVENOUS

## 2020-01-03 MED ORDER — ADULT MULTIVITAMIN W/MINERALS CH
1.0000 | ORAL_TABLET | Freq: Every day | ORAL | Status: DC
  Administered 2020-01-04 – 2020-03-19 (×76): 1 via ORAL
  Filled 2020-01-03 (×76): qty 1

## 2020-01-03 MED ORDER — FENTANYL CITRATE (PF) 100 MCG/2ML IJ SOLN
INTRAMUSCULAR | Status: AC | PRN
Start: 2020-01-03 — End: 2020-01-03
  Administered 2020-01-03: 25 ug via INTRAVENOUS

## 2020-01-03 MED ORDER — ONDANSETRON HCL 4 MG/2ML IJ SOLN: 4.0000 mg | Freq: Four times a day (QID) | INTRAMUSCULAR | Status: DC | PRN

## 2020-01-03 MED ORDER — LOSARTAN POTASSIUM 50 MG PO TABS
50.0000 mg | ORAL_TABLET | Freq: Every day | ORAL | Status: DC
  Administered 2020-01-03 – 2020-01-10 (×8): 50 mg via ORAL
  Filled 2020-01-03 (×8): qty 1

## 2020-01-03 MED ORDER — LIDOCAINE HCL 1 % IJ SOLN
INTRAMUSCULAR | Status: AC | PRN
  Administered 2020-01-03: 10 mL

## 2020-01-03 NOTE — Progress Notes (Signed)
PROGRESS NOTE    [name] [name]  PIR:[id number] DOB: [birth year] DOA: 01/02/2020 PCP: [name], [name], MD     Brief Narrative:  [name] [name] is a 79 y.o. male with medical history significant for CVA in October 2021 with residual dysarthria, essential hypertension, hyperlipidemia, spinal stenosis who initially presented at [facility] from [facility]F due to confusion and right-sided pneumonia.  Work-up revealed right-sided loculated pleural effusion with concern for empyema.  On 01/02/2020 patient was seen by general surgery at [facility] with recommendation to transfer to [facility] for CTS evaluation.  He was seen by interventional radiology at [facility], had an attempted ultrasound-guided chest tube placement.  IR was unable to place a chest tube due to the thickness of the pleural fluid.  Imaging consistent with right-sided empyema and evidence of possible pneumonia.  New events last 24 hours / Subjective: Patient without new complaints, no worsening shortness of breath.  Assessment & Plan:   Active Problems:   Empyema of lung ([place])   Right-sided parapneumonic effusion/empyema -Rocephin --> Zosyn -Cardiothoracic surgery consulted, no surgical recommendation but did recommend pigtail chest tube placement by IR -IR consulted  Acute hypoxemic respiratory failure -Currently on 3 L nasal cannula O2 without distress  History of CVA in October 2021 with residual dysarthria -Continue aspirin, Plavix, Crestor  Essential hypertension -Continue Cozaar  Anxiety/depression -Continue Cymbalta  Dementia -With mild cognitive impairment without behavioral disturbance  DVT prophylaxis:  enoxaparin (LOVENOX) injection 40 mg Start: 01/03/20 1000  Code Status: Full code Family Communication: None at bedside Disposition Plan:  Status is: Inpatient  Remains inpatient appropriate because:Ongoing diagnostic testing needed not appropriate for  outpatient work up   [place]:  Patient From: [place]  Planned Disposition: [place]  Expected discharge date: 01/06/20  Medically stable for discharge: No.  IR consulted for chest tube placement       Consultants:   Cardiothoracic surgery  IR  Procedures:     Antimicrobials:  Anti-infectives (From admission, onward)   Start     Dose/Rate Route Frequency Ordered Stop   01/02/20 2330  piperacillin-tazobactam (ZOSYN) IVPB 3.375 g        3.375 g 12.5 mL/hr over 240 Minutes Intravenous Every 8 hours 01/02/20 2242          Objective: Vitals:   01/03/20 0117 01/03/20 0502 01/03/20 0743 01/03/20 1238  BP: 125/79 124/76 124/68 130/79  Pulse: 80 81 62 85  Resp: 17 17 18 16   Temp: 98.6 F (37 C) 98.5 F (36.9 C) 98.1 F (36.7 C) 98.2 F (36.8 C)  TempSrc: Oral Oral  Oral  SpO2: 100% 100% 100% 95%    Intake/Output Summary (Last 24 hours) at 01/03/2020 1320 Last data filed at 01/03/2020 1238 Gross per 24 hour  Intake --  Output 120 ml  Net -120 ml   There were no vitals filed for this visit.  Examination:  General exam: Appears calm and comfortable  Respiratory system: Diminished breath sounds on right.  Respiratory effort is normal.  Nasal cannula O2 Cardiovascular system: S1 & S2 heard, RRR. No murmurs. No pedal edema. Gastrointestinal system: Abdomen is nondistended, soft and nontender. Normal bowel sounds heard. Central nervous system: Alert  Extremities: Symmetric in appearance  Skin: No rashes, lesions or ulcers on exposed skin   Data Reviewed: I have personally reviewed following labs and imaging studies  CBC: Recent Labs  Lab 12/30/19 0344 12/31/19 0550 01/01/20 0357 01/02/20 0359 01/03/20 0100  WBC 15.4* 15.4* 15.9* 15.0* 15.7*  NEUTROABS  --   --   --  11.6*  --   HGB 11.0* 11.6* 11.5* 11.6* 10.5*  HCT 35.1* 36.4* 37.3* 37.5* 34.1*  MCV 82.4 81.4 83.3 82.4 82.4  PLT 327 333 319 374 357   Basic Metabolic  Panel: Recent Labs  Lab 12/30/19 0344 12/31/19 0550 01/01/20 0357 01/02/20 0359 01/03/20 0100  NA 137 138 136 136 136  K 4.1 4.1 4.2 4.1 4.0  CL 102 100 101 100 99  CO2 23 27 27 27 25   GLUCOSE 110* 117* 119* 97 105*  BUN 22 22 19 17 14   CREATININE 0.99 1.11 0.87 0.92 1.07  CALCIUM 8.4* 8.4* 8.3* 8.2* 8.3*  MG  --   --   --  2.0  --    GFR: Estimated Creatinine Clearance: 53.8 mL/min (by C-G formula based on SCr of 1.07 mg/dL). Liver Function Tests: Recent Labs  Lab 01/03/20 0100  AST 24  ALT 19  ALKPHOS 65  BILITOT 0.8  PROT 6.4*  ALBUMIN 2.0*   No results for input(s): LIPASE, AMYLASE in the last 168 hours. No results for input(s): AMMONIA in the last 168 hours. Coagulation Profile: Recent Labs  Lab 01/01/20 1018  INR 1.1   Cardiac Enzymes: No results for input(s): CKTOTAL, CKMB, CKMBINDEX, TROPONINI in the last 168 hours. BNP (last 3 results) No results for input(s): PROBNP in the last 8760 hours. HbA1C: No results for input(s): HGBA1C in the last 72 hours. CBG: No results for input(s): GLUCAP in the last 168 hours. Lipid Profile: No results for input(s): CHOL, HDL, LDLCALC, TRIG, CHOLHDL, LDLDIRECT in the last 72 hours. Thyroid Function Tests: No results for input(s): TSH, T4TOTAL, FREET4, T3FREE, THYROIDAB in the last 72 hours. Anemia Panel: No results for input(s): VITAMINB12, FOLATE, FERRITIN, TIBC, IRON, RETICCTPCT in the last 72 hours. Sepsis Labs: No results for input(s): PROCALCITON, LATICACIDVEN in the last 168 hours.  Recent Results (from the past 240 hour(s))  Resp Panel by RT-PCR (Flu A&B, Covid) Nasopharyngeal Swab     Status: None   Collection Time: 12/26/19  3:10 PM   Specimen: Nasopharyngeal Swab; Nasopharyngeal(NP) swabs in vial transport medium  Result Value Ref Range Status   SARS Coronavirus 2 by RT PCR NEGATIVE NEGATIVE Final    Comment: (NOTE) SARS-CoV-2 target nucleic acids are NOT DETECTED.  The SARS-CoV-2 RNA is generally  detectable in upper respiratory specimens during the acute phase of infection. The lowest concentration of SARS-CoV-2 viral copies this assay can detect is 138 copies/mL. A negative result does not preclude SARS-Cov-2 infection and should not be used as the sole basis for treatment or other patient management decisions. A negative result may occur with  improper specimen collection/handling, submission of specimen other than nasopharyngeal swab, presence of viral mutation(s) within the areas targeted by this assay, and inadequate number of viral copies(<138 copies/mL). A negative result must be combined with clinical observations, patient history, and epidemiological information. The expected result is Negative.  Fact Sheet for Patients:  EntrepreneurPulse.com.au  Fact Sheet for Healthcare Providers:  IncredibleEmployment.be  This test is no t yet approved or cleared by the [place] FDA and  has been authorized for detection and/or diagnosis of SARS-CoV-2 by FDA under an Emergency Use Authorization (EUA). This EUA will remain  in effect (meaning this test can be used) for the duration of the COVID-19 declaration under Section 564(b)(1) of the Act, 21 U.S.C.section 360bbb-3(b)(1), unless the authorization is terminated  or revoked sooner.       Influenza A by PCR NEGATIVE NEGATIVE Final   Influenza B by PCR NEGATIVE NEGATIVE Final    Comment: (NOTE) The Xpert Xpress SARS-CoV-2/FLU/RSV plus assay is intended as an aid in the diagnosis of influenza from Nasopharyngeal swab specimens and should not be used as a sole basis for treatment. Nasal washings and aspirates are unacceptable for Xpert Xpress SARS-CoV-2/FLU/RSV testing.  Fact Sheet for Patients: EntrepreneurPulse.com.au  Fact Sheet for Healthcare Providers: IncredibleEmployment.be  This test is not yet approved or cleared by the [place] FDA  and has been authorized for detection and/or diagnosis of SARS-CoV-2 by FDA under an Emergency Use Authorization (EUA). This EUA will remain in effect (meaning this test can be used) for the duration of the COVID-19 declaration under Section 564(b)(1) of the Act, 21 U.S.C. section 360bbb-3(b)(1), unless the authorization is terminated or revoked.  Performed at [facility], [address] [place], [place] [address]   Culture, blood (routine x 2)     Status: None   Collection Time: 12/26/19  3:10 PM   Specimen: BLOOD  Result Value Ref Range Status   Specimen Description BLOOD RIGHT ARM  Final   Special Requests   Final    BOTTLES DRAWN AEROBIC AND ANAEROBIC Blood Culture results may not be optimal due to an excessive volume of blood received in culture bottles   Culture   Final    NO GROWTH 5 DAYS Performed at [facility], [address], [place], [place] [address]    Report Status 12/31/2019 FINAL  Final  Culture, blood (routine x 2)     Status: None   Collection Time: 12/26/19  3:11 PM   Specimen: BLOOD  Result Value Ref Range Status   Specimen Description BLOOD LEFT FORE ARM  Final   Special Requests   Final    BOTTLES DRAWN AEROBIC AND ANAEROBIC Blood Culture adequate volume   Culture   Final    NO GROWTH 5 DAYS Performed at [facility], [address], [place], [place] [address]    Report Status 12/31/2019 FINAL  Final  MRSA PCR Screening     Status: None   Collection Time: 01/02/20  6:38 AM   Specimen: Nasal Mucosa; Nasopharyngeal  Result Value Ref Range Status   MRSA by PCR NEGATIVE NEGATIVE Final    Comment:        The GeneXpert MRSA Assay (FDA approved for NASAL specimens only), is one component of a comprehensive MRSA colonization surveillance program. It is not intended to diagnose MRSA infection nor to guide or monitor treatment for MRSA infections. Performed at [facility], [place] [address] [place], [place]  [address]       Radiology Studies: CT CHEST W CONTRAST  Result Date: 01/01/2020 CLINICAL DATA:  Abnormal chest radiograph.  Short of breath. EXAM: CT CHEST WITH CONTRAST TECHNIQUE: Multidetector CT imaging of the chest was performed during intravenous contrast administration. CONTRAST:  11mL OMNIPAQUE IOHEXOL 300 MG/ML  SOLN COMPARISON:  Current chest radiograph.  CT, 03/07/2005. FINDINGS: Cardiovascular: Heart is normal in size. No pericardial effusion. Three-vessel coronary artery calcifications. Aorta is normal in caliber. Minor atherosclerosis. Mediastinum/Nodes: Normal thyroid. No neck base, axillary, mediastinal or hilar masses or enlarged lymph nodes. Trachea and esophagus are unremarkable. Lungs/Pleura: Moderate size loculated right pleural effusion. Minimal left pleural effusion. Consolidation/atelectasis of the majority of the right lower lobe. Mild dependent atelectasis in the right middle lobe. Peripheral linear/reticular opacities  in the dependent right upper lobe, also likely atelectasis. Non dependent aspects of the right upper and middle lobes are clear. Minor dependent atelectasis in the left lower lobe. Remainder of the left lung is clear. Mild emphysema. No pneumothorax. Upper Abdomen: No acute findings. Musculoskeletal: No fracture or acute finding. No bone lesion. No chest wall mass. IMPRESSION: 1. Moderate-size loculated right pleural effusion, which may reflect an empyema. Most of the right lower lobe is opacified consistent with pneumonia, atelectasis or a combination. 2. Additional mild dependent linear/reticular atelectasis in the right upper and middle lobes and left lower lobe. Trace left pleural effusion. 3. No visualized centrally obstructing mass. 4. Emphysema and mild aortic atherosclerosis. Aortic Atherosclerosis (ICD10-I70.0) and Emphysema (ICD10-J43.9). Electronically Signed   By: Lajean Manes M.D.   On: 01/01/2020 17:06      Scheduled Meds: . aspirin EC  81 mg Oral  Daily  . clopidogrel  75 mg Oral Daily  . DULoxetine  60 mg Oral Daily  . enoxaparin (LOVENOX) injection  40 mg Subcutaneous Daily  . rosuvastatin  10 mg Oral Daily   Continuous Infusions: . piperacillin-tazobactam (ZOSYN)  IV 3.375 g (01/03/20 0024)     LOS: 1 day      Time spent: 35 minutes   Dessa Phi, DO [facility] 01/03/2020, 1:20 PM   Available via Epic secure chat 7am-7pm After these hours, please refer to coverage provider listed on [email or web address]

## 2020-01-03 NOTE — TOC Initial Note (Signed)
Transition of Care [facility]) - Initial/Assessment Note    Patient Details  Name: [name] [name] MRN: [id number] Date of Birth: 1940/03/20  Transition of Care [facility]) CM/SW Contact:    Bethann Berkshire, [place] Phone Number: 01/03/2020, 2:28 PM  Clinical Narrative:                 CSW met with pt and pt sister [facility] bedside. Pt is lethargic and has minimal engagement in discussion. Sister explains that pt has been at [facility] since October 2021. She reports he has medicare and that [facility] has been assisting pt in applying for medicaid to cover him for long-term care. Pt sister has some interest in explore snf's closer to [facility] but wants CSW to speak with [facility] before additional facilities are sought. Sister explains that she is unable to care for pt at home and that her apartment manager would not allow him to come home with her.   CSW contacted [facility] admissions; left message requesting call back.   Fl2 completed    Barriers to Discharge: Continued Medical Work up, SNF Pending bed offer   Patient Goals and CMS Choice Patient states their goals for this hospitalization and ongoing recovery are:: Pt not oriented; sister wants SNF LTC      Expected Discharge Plan and Services         Living arrangements for the past 2 months: [place]                                      Prior Living Arrangements/Services Living arrangements for the past 2 months: [place] Lives with:: Facility Resident          Need for Family Participation in Patient Care: Yes (Comment) Care giver support system in place?: No (comment)      Activities of Daily Living Home Assistive Devices/Equipment: None ADL Screening (condition at time of admission) Patient's cognitive ability adequate to safely complete daily activities?: No Is the patient deaf or have difficulty hearing?: No Does the patient have difficulty seeing, even  when wearing glasses/contacts?: Yes Does the patient have difficulty concentrating, remembering, or making decisions?: Yes Patient able to express need for assistance with ADLs?: Yes Does the patient have difficulty dressing or bathing?: Yes Independently performs ADLs?: No Communication: Independent Does the patient have difficulty walking or climbing stairs?: Yes Weakness of Legs: Both Weakness of Arms/Hands: Both  Permission Sought/Granted                  Emotional Assessment Appearance:: Appears stated age Attitude/Demeanor/Rapport: Lethargic Affect (typically observed): Flat Orientation: : Oriented to Self, Oriented to Place Alcohol / Substance Use: Not Applicable Psych Involvement: No (comment)  Admission diagnosis:  Empyema of lung ([place]) [J86.9] Patient Active Problem List   Diagnosis Date Noted  . Empyema of lung ([place]) 01/02/2020  . Malnutrition of moderate degree 12/28/2019  . Lobar pneumonia ([place]) 12/26/2019  . Acute hypoxemic respiratory failure ([place]) 12/26/2019  . Dysphagia 12/26/2019  . Acute encephalopathy 12/26/2019  . CVA (cerebral vascular accident) ([place]) 11/03/2019  . AMS (altered mental status) 11/02/2019  . Acute lower UTI 11/02/2019  . Weakness 09/20/2019  . Protein-calorie malnutrition, severe 09/20/2019  . AKI (acute kidney injury) ([place]) 09/19/2019  . Ambulatory dysfunction 09/19/2019  . Dehydration 09/19/2019  . Fall at home, initial encounter 09/19/2019  . Aortic  atherosclerosis ([place]) 02/16/2019  . Hypertension, essential, benign 08/13/2018  . Urinary frequency 02/05/2018  . PAD (peripheral artery disease) (HCC)-Mild 12/26/2017  . Chronic pain disorder 11/17/2017  . Unstable gait 11/17/2017  . BPH associated with nocturia 09/26/2017  . Allergic rhinitis 06/09/2017  . Tobacco use disorder 02/14/2017  . Generalized osteoarthritis of multiple sites 02/14/2017  . Bilateral lower extremity edema 08/12/2016  . Chronic right-sided low back  pain with right-sided sciatica 03/14/2016  . Chronic pain of right knee 03/14/2016  . DDD (degenerative disc disease), lumbar 07/07/2014  . DDD (degenerative disc disease), cervical 07/07/2014  . DJD (degenerative joint disease) of knee 07/07/2014  . Bilateral occipital neuralgia 07/07/2014  . Spinal stenosis, lumbar region, with neurogenic claudication 07/07/2014   PCP:  [name], [name], MD Pharmacy:   [facility] [place], [place] - [address] AT [facility] [address] [place] [address] Phone: [phone] Fax: [phone]     Social Determinants of Health (SDOH) Interventions    Readmission Risk Interventions No flowsheet data found.

## 2020-01-03 NOTE — Progress Notes (Signed)
Initial Nutrition Assessment  DOCUMENTATION CODES:   Not applicable  INTERVENTION:   -Once diet is advanced, add:   -Ensure Enlive po TID, each supplement provides 350 kcal and 20 grams of protein -MVI with minerals daily  NUTRITION DIAGNOSIS:   Increased nutrient needs related to acute illness (rt empyema) as evidenced by estimated needs.  GOAL:   Patient will meet greater than or equal to 90% of their needs  MONITOR:   PO intake, Supplement acceptance, Diet advancement, Labs, Weight trends, Skin, I & O's  REASON FOR ASSESSMENT:   Malnutrition Screening Tool    ASSESSMENT:   [name] [name] is a 79 y.o. male with medical history significant for CVA in October 2021 with residual dysarthria, essential hypertension, hyperlipidemia, spinal stenosis who initially presented at [facility] from [facility] SNF due to confusion and right-sided pneumonia.  Pt admitted with rt empyema.   Per H&P, chest tube unable to be placed at [facility] secondary to thickness of pleural fluid. Pt may require VATS.    Pt in with MD and RN at times of visits. Unable to obtain further nutrition-related history at this time.   Plan for chest tube placement today. Per CVTS notes, pt is not a surgical or VATS candidate.   Pt would greatly benefit from addition of oral nutrition supplements once diet is advanced. Pt with history of moderate malnutrition and suspect this diagnosis is ongoing.   Reviewed wt hx; wt has been stable over the past year.   Medications reviewed.   Labs reviewed.   Diet Order:   Diet Order            Diet NPO time specified  Diet effective midnight                 EDUCATION NEEDS:   No education needs have been identified at this time  Skin:  Skin Assessment: Reviewed RN Assessment  Last BM:  Unknown  Height:   Ht Readings from Last 1 Encounters:  01/03/20 5\' 10"  (1.778 m)    Weight:   Wt Readings from Last 1 Encounters:   01/03/20 66.8 kg    Ideal Body Weight:  74.5 kg  BMI:  Body mass index is 21.13 kg/m.  Estimated Nutritional Needs:   Kcal:  2150-2350  Protein:  120-135 grams  Fluid:  > 2 L    Loistine Chance, RD, LDN, [place] Registered Dietitian II Certified Diabetes Care and Education Specialist Please refer to [facility] for RD and/or RD on-call/weekend/after hours pager

## 2020-01-03 NOTE — NC FL2 (Signed)
[place] LEVEL OF CARE SCREENING TOOL     IDENTIFICATION  Patient Name: [name] [name] Birthdate: 10-30-1940 Sex: male Admission Date (Current Location): 01/02/2020  [facility] [place] Number:  Herbalist and Address:  [facility], [place] [address], [place], [place] [address]      Provider Number: [id number]  Attending Physician Name and Address:  Dessa Phi, DO  Relative Name and Phone Number:  Patient's sister, Faaris Arizpe [phone]    Current Level of Care: Hospital Recommended Level of Care: [place] Prior Approval Number:    Date Approved/Denied: 09/20/19 PASRR Number: [id number]  Discharge Plan: SNF    Current Diagnoses: Patient Active Problem List   Diagnosis Date Noted  . Empyema of lung ([place]) 01/02/2020  . Malnutrition of moderate degree 12/28/2019  . Lobar pneumonia ([place]) 12/26/2019  . Acute hypoxemic respiratory failure ([place]) 12/26/2019  . Dysphagia 12/26/2019  . Acute encephalopathy 12/26/2019  . CVA (cerebral vascular accident) ([place]) 11/03/2019  . AMS (altered mental status) 11/02/2019  . Acute lower UTI 11/02/2019  . Weakness 09/20/2019  . Protein-calorie malnutrition, severe 09/20/2019  . AKI (acute kidney injury) ([place]) 09/19/2019  . Ambulatory dysfunction 09/19/2019  . Dehydration 09/19/2019  . Fall at home, initial encounter 09/19/2019  . Aortic atherosclerosis ([place]) 02/16/2019  . Hypertension, essential, benign 08/13/2018  . Urinary frequency 02/05/2018  . PAD (peripheral artery disease) (HCC)-Mild 12/26/2017  . Chronic pain disorder 11/17/2017  . Unstable gait 11/17/2017  . BPH associated with nocturia 09/26/2017  . Allergic rhinitis 06/09/2017  . Tobacco use disorder 02/14/2017  . Generalized osteoarthritis of multiple sites 02/14/2017  . Bilateral lower extremity edema 08/12/2016  . Chronic right-sided low back pain with right-sided sciatica 03/14/2016  .  Chronic pain of right knee 03/14/2016  . DDD (degenerative disc disease), lumbar 07/07/2014  . DDD (degenerative disc disease), cervical 07/07/2014  . DJD (degenerative joint disease) of knee 07/07/2014  . Bilateral occipital neuralgia 07/07/2014  . Spinal stenosis, lumbar region, with neurogenic claudication 07/07/2014    Orientation RESPIRATION BLADDER Height & Weight     Self, Place  O2 ([place] 3L/min) External catheter Weight: 147 lb 4.3 oz (66.8 kg) Height:  5\' 10"  (177.8 cm)  BEHAVIORAL SYMPTOMS/MOOD NEUROLOGICAL BOWEL NUTRITION STATUS      Incontinent Diet (see d/c summary)  AMBULATORY STATUS COMMUNICATION OF NEEDS Skin   Extensive Assist Verbally Normal                       Personal Care Assistance Level of Assistance  Bathing, Feeding, Dressing Bathing Assistance: Maximum assistance Feeding assistance: Maximum assistance Dressing Assistance: Maximum assistance     Functional Limitations Info  Sight, Hearing Sight Info: Adequate Hearing Info: Adequate Speech Info: Impaired    SPECIAL CARE FACTORS FREQUENCY  PT (By licensed PT), OT (By licensed OT)     PT Frequency: 5x/week OT Frequency: 5x/week            Contractures      Additional Factors Info  Code Status, Allergies Code Status Info: Full Code Allergies Info: No Known allergies           Current Medications (01/03/2020):  This is the current hospital active medication list Current Facility-Administered Medications  Medication Dose Route Frequency Provider Last Rate Last Admin  . aspirin EC tablet 81 mg  81 mg Oral Daily Irene Pap N, DO   81 mg at 01/03/20 1049  .  clopidogrel (PLAVIX) tablet 75 mg  75 mg Oral Daily Irene Pap N, DO   75 mg at 01/03/20 1049  . DULoxetine (CYMBALTA) DR capsule 60 mg  60 mg Oral Daily Irene Pap N, DO   60 mg at 01/03/20 1049  . enoxaparin (LOVENOX) injection 40 mg  40 mg Subcutaneous Daily Irene Pap N, DO   40 mg at 01/03/20 1050  . losartan (COZAAR)  tablet 50 mg  50 mg Oral Daily Dessa Phi, DO   50 mg at 01/03/20 1415  . ondansetron (ZOFRAN) injection 4 mg  4 mg Intravenous Q6H PRN Irene Pap N, DO      . piperacillin-tazobactam (ZOSYN) IVPB 3.375 g  3.375 g Intravenous Q8H Lyndee Leo, RPH 12.5 mL/hr at 01/03/20 1418 3.375 g at 01/03/20 1418  . rosuvastatin (CRESTOR) tablet 10 mg  10 mg Oral Daily Irene Pap N, DO   10 mg at 01/03/20 1049     Discharge Medications: Please see discharge summary for a list of discharge medications.  Relevant Imaging Results:  Relevant Lab Results:   Additional Information [phone]  Bethann Berkshire, LCSW

## 2020-01-03 NOTE — Evaluation (Signed)
Physical Therapy Evaluation Patient Details Name: [name] [name] MRN: [id number] DOB: 09-13-1940 Today's Date: 01/03/2020   History of Present Illness  Pt is a 79 y.o. male (from SNF) admitted 01/02/20 as transfer from [facility] (admitted there 12/26/19) for CTS evaluation for possible VATS decortication. Workup revealed R-side loculated pleural efffusion with concern for empyema, possible PNA. PMH includes lumbar stenosis, renal disorder.    Clinical Impression  Pt presents with an overall decrease in functional mobility secondary to above. PTA, pt has been at [facility] for ~1 month (sister present and confirms this). Today, pt required up to [facility] for mobility; pt unaware of bowel incontinence, dependent for pericare. Pt following majority of simple commands, although with apparent slowed processing and poor insight into current functional mobility deficits. Pt would benefit from continued acute PT services to maximize functional mobility and independence prior to d/c with continued SNF-level therapies.     Follow Up Recommendations SNF;Supervision/Assistance - 24 hour    Equipment Recommendations  Rolling walker with 5" wheels;3in1 (PT);Wheelchair (measurements PT);Wheelchair cushion (measurements PT)    Recommendations for Other Services       Precautions / Restrictions Precautions Precautions: Fall;Other (comment) Precaution Comments: Watch SpO2; bladder/bowel incontinence Restrictions Weight Bearing Restrictions: No      Mobility  Bed Mobility Overal bed mobility: Needs Assistance Bed Mobility: Supine to Sit     Supine to sit: Mod assist;HOB elevated     General bed mobility comments: ModA for UE support to elevate trunk; increased time and effort    Transfers Overall transfer level: Needs assistance Equipment used: None;Rolling walker (2 wheeled) Transfers: Sit to/from Stand Sit to Stand: Min assist         General transfer comment: MinA to elevate  trunk standing from EOB, pt declined RW but then reaching for UE support requiring RW for stability; multiple sit<>stands from recliner to RW for pericare/linen change due to bowel incontinence  Ambulation/Gait Ambulation/Gait assistance: Min assist;Mod assist Gait Distance (Feet): 2 Feet Assistive device: Rolling walker (2 wheeled) Gait Pattern/deviations: Step-to pattern;Trunk flexed;Leaning posteriorly Gait velocity: Decreased Gait velocity interpretation: <1.31 ft/sec, indicative of household ambulator General Gait Details: Slow, unsteady gait with RW and min-modA for stability and RW navigation; pt requiring step-by-step cues for sequencing; moving very slowly  Financial trader Rankin (Stroke Patients Only)       Balance Overall balance assessment: Needs assistance Sitting-balance support: Bilateral upper extremity supported;Feet supported Sitting balance-Leahy Scale: Fair     Standing balance support: Bilateral upper extremity supported Standing balance-Leahy Scale: Poor Standing balance comment: Reliant on UE support and min guard for standing; dependent for posterior pericare while standing                             Pertinent Vitals/Pain Pain Assessment: No/denies pain Pain Intervention(s): Monitored during session    Home Living Family/patient expects to be discharged to:: Skilled nursing facility                 Additional Comments: Pt from [facility], per chart pt plans for LTC after rehab, pt reports hopeful for d/c home    Prior Function Level of Independence: Needs assistance   Gait / Transfers Assistance Needed: Pt reports working with PT on ambulation with walker  ADL's / Homemaking Assistance Needed: Pt reports requires assist from ADLs  Hand Dominance   Dominant Hand: Right    Extremity/Trunk Assessment   Upper Extremity Assessment Upper Extremity Assessment: Generalized  weakness    Lower Extremity Assessment Lower Extremity Assessment: Generalized weakness    Cervical / Trunk Assessment Cervical / Trunk Assessment: Kyphotic  Communication   Communication: HOH;Expressive difficulties  Cognition Arousal/Alertness: Awake/alert Behavior During Therapy: Flat affect Overall Cognitive Status: Impaired/Different from baseline Area of Impairment: Following commands;Safety/judgement;Problem solving;Memory;Attention;Awareness                   Current Attention Level: Sustained Memory: Decreased short-term memory Following Commands: Follows one step commands with increased time;Follows multi-step commands inconsistently Safety/Judgement: Decreased awareness of safety;Decreased awareness of deficits Awareness: Intellectual Problem Solving: Slow processing;Decreased initiation;Requires verbal cues General Comments: Sister present, but difficult to determine from her whether pt presenting at baseline cognition or not. Pt with poor insight into deficits; answering questions appropriately, but not forthcoming with resposnes and conversation      General Comments General comments (skin integrity, edema, etc.): SpO2 down to mid-80s on RA, returning to >/91% on 3L O2 [place]; DOE 2/4 noted. Sister present    Exercises     Assessment/Plan    PT Assessment Patient needs continued PT services  PT Problem List Decreased strength;Decreased mobility;Decreased safety awareness;Decreased activity tolerance;Decreased cognition;Cardiopulmonary status limiting activity;Decreased knowledge of use of DME;Decreased balance       PT Treatment Interventions DME instruction;Therapeutic exercise;Balance training;Gait training;Stair training;Cognitive remediation;Functional mobility training;Therapeutic activities;Patient/family education    PT Goals (Current goals can be found in the Care Plan section)  Acute Rehab PT Goals Patient Stated Goal: "I want to go home after  this" PT Goal Formulation: With patient/family Time For Goal Achievement: 01/17/20 Potential to Achieve Goals: Fair    Frequency Min 2X/week   Barriers to discharge        Co-evaluation               AM-PAC PT "6 Clicks" Mobility  Outcome Measure Help needed turning from your back to your side while in a flat bed without using bedrails?: A Little Help needed moving from lying on your back to sitting on the side of a flat bed without using bedrails?: A Lot Help needed moving to and from a bed to a chair (including a wheelchair)?: A Lot Help needed standing up from a chair using your arms (e.g., wheelchair or bedside chair)?: A Little Help needed to walk in hospital room?: A Lot Help needed climbing 3-5 steps with a railing? : A Lot 6 Click Score: 14    End of Session Equipment Utilized During Treatment: Gait belt;Oxygen Activity Tolerance: Patient tolerated treatment well Patient left: in chair;with call bell/phone within reach;with chair alarm set;with family/visitor present Nurse Communication: Mobility status PT Visit Diagnosis: Difficulty in walking, not elsewhere classified (R26.2);Muscle weakness (generalized) (M62.81)    Time: 4174-0814 PT Time Calculation (min) (ACUTE ONLY): 35 min   Charges:   PT Evaluation $PT Eval Moderate Complexity: 1 Mod PT Treatments $Therapeutic Activity: 8-22 mins       Mabeline Caras, PT, DPT Acute Rehabilitation Services  Pager [phone] Office [phone]  Derry Lory 01/03/2020, 12:57 PM

## 2020-01-03 NOTE — Sedation Documentation (Signed)
Attempted to call report to [phone].

## 2020-01-03 NOTE — Consult Note (Signed)
Chief Complaint: Patient was seen in consultation today for right chest tube placement.  Referring Physician(s): Dr. Dessa Phi  Supervising Physician: Corrie Mckusick  Patient Status: [facility] - In-pt  History of Present Illness: [name] [name] is a 79 y.o. male with a past medical history significant for spinal stenosis, HTN, HLD, dementia and CVA (11/2019) currently on Plavix who presented to [facility] from SNF on 11/25 with complaints of AMS and pneumonia. Initial evaluation showed leukocytosis and new right basilar infiltrate c/w pneumonia and a small parapneumonic right pleural effusion. He was admitted and started on IV abx as well as supplemental oxygen. Follow up CXR on 12/1 showed moderate size right pleural effusion with associated right basilar atelectasis or infiltrate and IR was asked to evaluate the patient for possible thoracentesis to r/o empyema. [place] chest showed a moderate sized densely loculated and complex right pleural effusion which was not amenable to thoracentesis - a CT chest and cardiothoracic consult was recommended. CT chest w/contrast on 12/1 showed a moderate-size loculated right pleural effusion which may reflect empyema with most of the RLL opacified which is consistent with pneumonia, atelectasis or a combination. He was transferred to [facility] for TCTS consult and has been deemed not a surgical candidate - IR has been asked to place a right sided chest tube for further management of loculated pleural effusion.  [name] [name] was seen in his room today, his sister is at bedside. He is sitting up in chair watching TV. When asked how he is feeling he states, "bad" but does not elaborate further. Discussed chest tube procedure today with patient and his sister who both agree to proceed - patient is able to repeat back to me that we are "putting in a chest tube to help me feel better." His sister would like for him to sign his consent form but she confirms she is also  agreeable with chest tube placement.  Past Medical History:  Diagnosis Date  . Back pain   . Renal disorder    kidney stones  . Spinal stenosis, lumbar region, with neurogenic claudication 07/07/2014    Past Surgical History:  Procedure Laterality Date  . CATARACT EXTRACTION W/PHACO Left 08/22/2016   Procedure: CATARACT EXTRACTION PHACO AND INTRAOCULAR LENS PLACEMENT ([place]) Left;  Surgeon: [name], MD;  Location: [place];  Service: Ophthalmology;  Laterality: Left;  . CATARACT EXTRACTION W/PHACO Right 10/25/2016   Procedure: CATARACT EXTRACTION PHACO AND INTRAOCULAR LENS PLACEMENT ([place]) WITH ISTENT RIGHT  ;  Surgeon: [name], MD;  Location: [place];  Service: Ophthalmology;  Laterality: Right;  TOPICAL RIGHT  ISTENT   trabecular bypass stent was implanted/explanted.  . INGUINAL HERNIA REPAIR    . KNEE SURGERY Right   . NECK SURGERY      Allergies: Patient has no known allergies.  Medications: Prior to Admission medications   Medication Sig Start Date End Date Taking? Authorizing Provider  cefTRIAXone 2 g in sodium chloride 0.9 % 100 mL Inject 2 g into the vein daily. 01/02/20  Yes [name], MD  DULoxetine (CYMBALTA) 60 MG capsule Take 1 capsule (60 mg total) by mouth daily. 10/15/19  Yes [name], [name], MD  enoxaparin (LOVENOX) 40 MG/0.4ML injection Inject 0.4 mLs (40 mg total) into the skin daily. 01/02/20  Yes [name], MD  feeding supplement, ENSURE ENLIVE, (ENSURE ENLIVE) LIQD Take 237 mLs by mouth 3 (three) times daily between meals. 11/07/19  Yes [name], MD  furosemide (LASIX) 20  MG tablet 2 tabs daily for 5 days then 1-2 tabs daily as needed for leg swelling. 10/16/19  Yes [name], [name], MD  guaifenesin (ROBITUSSIN) 100 MG/5ML syrup Take 200 mg by mouth 3 (three) times daily as needed for cough.   Yes [provider]  losartan (COZAAR) 50 MG tablet Take 1 tablet (50 mg total) by mouth daily. 10/15/19  Yes  [name], [name], MD  nicotine (NICODERM CQ - DOSED IN MG/24 HOURS) 21 mg/24hr patch Place 1 patch (21 mg total) onto the skin daily. 09/23/19  Yes [name], MD  Oxycodone HCl 10 MG TABS Take 1 tablet (10 mg total) by mouth 2 (two) times daily as needed (for moderate - severe pain). 10/16/19  Yes [name], [name], MD  polyethylene glycol (MIRALAX / GLYCOLAX) 17 g packet Take 34 g by mouth 2 (two) times daily. 09/22/19  Yes [name], MD  potassium chloride SA (KLOR-CON) 20 MEQ tablet Take 1 tablet (20 mEq total) by mouth daily as needed (When taking fluid pill.). 10/16/19  Yes [name], [name], MD  rosuvastatin (CRESTOR) 10 MG tablet Take 1 tablet (10 mg total) by mouth daily. 10/16/19  Yes [name], [name], MD  ARTIFICIAL TEARS 0.2-0.2-1 % SOLN Place 2 drops into both eyes in the morning and at bedtime.  11/15/19   [provider]  aspirin EC 81 MG tablet Take 81 mg by mouth daily. Swallow whole.    [provider]  clopidogrel (PLAVIX) 75 MG tablet Take 1 tablet (75 mg total) by mouth daily. 11/08/19   [name], MD     Family History  Problem Relation Age of Onset  . Diabetes Mother   . Diabetes Brother   . Diabetes Sister     Social History   Socioeconomic History  . Marital status: Widowed    Spouse name: Not on file  . Number of children: Not on file  . Years of education: Not on file  . Highest education level: Not on file  Occupational History  . Not on file  Tobacco Use  . Smoking status: Current Every Day Smoker    Packs/day: 0.75    Types: Cigarettes  . Smokeless tobacco: Never Used  Vaping Use  . Vaping Use: Never used  Substance and Sexual Activity  . Alcohol use: No  . Drug use: Never  . Sexual activity: Not on file  Other Topics Concern  . Not on file  Social History Narrative  . Not on file   Social Determinants of Health   Financial Resource Strain:   . Difficulty of Paying Living Expenses: Not on file  Food Insecurity:   . Worried About  Charity fundraiser in the Last Year: Not on file  . Ran Out of Food in the Last Year: Not on file  Transportation Needs:   . Lack of Transportation (Medical): Not on file  . Lack of Transportation (Non-Medical): Not on file  Physical Activity:   . Days of Exercise per Week: Not on file  . Minutes of Exercise per Session: Not on file  Stress:   . Feeling of Stress : Not on file  Social Connections:   . Frequency of Communication with Friends and Family: Not on file  . Frequency of Social Gatherings with Friends and Family: Not on file  . Attends Religious Services: Not on file  . Active Member of Clubs or Organizations: Not on file  . Attends Archivist Meetings: Not  on file  . Marital Status: Not on file     Review of Systems: A 12 point ROS discussed and pertinent positives are indicated in the HPI above.  All other systems are negative.  Review of Systems  Unable to perform ROS: Mental status change (Patient only states that he feels bad, he is unable to elaborate further)    Vital Signs: BP 124/68 (BP Location: Left Arm)   Pulse 62   Temp 98.1 F (36.7 C)   Resp 18   SpO2 100%   Physical Exam Vitals and nursing note reviewed.  Constitutional:      General: He is not in acute distress.    Appearance: He is ill-appearing.     Comments: Sitting in recliner chair, per sister at bedside he is uncomfortable and wants to go back to bed. Unable to illicit much information from patient on exam today.  HENT:     Head: Normocephalic.     Mouth/Throat:     Mouth: Mucous membranes are moist.     Pharynx: Oropharynx is clear. No oropharyngeal exudate or posterior oropharyngeal erythema.  Cardiovascular:     Rate and Rhythm: Normal rate and regular rhythm.  Pulmonary:     Effort: Pulmonary effort is normal.     Comments: (+) supplemental O2 via [place] (+) decreased breath sounds right side Skin:    General: Skin is warm and dry.  Neurological:     Mental Status: He is  alert.     Comments: Patient is alert, oriented to self and hospital, able to tell me we are planning to "put in a chest tube to make me feel better." His sister asks that he signs his own consent forms but she also acknowledges that she is agreeable to the procedure.      MD Evaluation Airway: WNL Heart: WNL Abdomen: WNL Chest/ Lungs: WNL ASA  Classification: 3 Mallampati/Airway Score: Two   Imaging: DG Chest 2 View  Result Date: 01/01/2020 CLINICAL DATA:  Altered mental status, lobar pneumonia. EXAM: CHEST - 2 VIEW COMPARISON:  December 26, 2019. FINDINGS: Stable cardiomediastinal silhouette. No pneumothorax is noted. Left lung is clear. Moderate size right pleural effusion is noted with associated right basilar atelectasis or infiltrate. Bony thorax is unremarkable. IMPRESSION: Moderate size right pleural effusion with associated right basilar atelectasis or infiltrate. Electronically Signed   By: Marijo Conception M.D.   On: 01/01/2020 08:22   CT CHEST W CONTRAST  Result Date: 01/01/2020 CLINICAL DATA:  Abnormal chest radiograph.  Short of breath. EXAM: CT CHEST WITH CONTRAST TECHNIQUE: Multidetector CT imaging of the chest was performed during intravenous contrast administration. CONTRAST:  84mL OMNIPAQUE IOHEXOL 300 MG/ML  SOLN COMPARISON:  Current chest radiograph.  CT, 03/07/2005. FINDINGS: Cardiovascular: Heart is normal in size. No pericardial effusion. Three-vessel coronary artery calcifications. Aorta is normal in caliber. Minor atherosclerosis. Mediastinum/Nodes: Normal thyroid. No neck base, axillary, mediastinal or hilar masses or enlarged lymph nodes. Trachea and esophagus are unremarkable. Lungs/Pleura: Moderate size loculated right pleural effusion. Minimal left pleural effusion. Consolidation/atelectasis of the majority of the right lower lobe. Mild dependent atelectasis in the right middle lobe. Peripheral linear/reticular opacities in the dependent right upper lobe, also  likely atelectasis. Non dependent aspects of the right upper and middle lobes are clear. Minor dependent atelectasis in the left lower lobe. Remainder of the left lung is clear. Mild emphysema. No pneumothorax. Upper Abdomen: No acute findings. Musculoskeletal: No fracture or acute finding. No bone lesion. No chest  wall mass. IMPRESSION: 1. Moderate-size loculated right pleural effusion, which may reflect an empyema. Most of the right lower lobe is opacified consistent with pneumonia, atelectasis or a combination. 2. Additional mild dependent linear/reticular atelectasis in the right upper and middle lobes and left lower lobe. Trace left pleural effusion. 3. No visualized centrally obstructing mass. 4. Emphysema and mild aortic atherosclerosis. Aortic Atherosclerosis (ICD10-I70.0) and Emphysema (ICD10-J43.9). Electronically Signed   By: Lajean Manes M.D.   On: 01/01/2020 17:06   [place] CHEST (PLEURAL EFFUSION)  Result Date: 01/01/2020 CLINICAL DATA:  Please perform chest ultrasound an ultrasound-guided thoracentesis as indicated. EXAM: CHEST ULTRASOUND COMPARISON:  Chest radiograph-01/01/2020; 12/26/2019 FINDINGS: Sonographic evaluation of the right hemithorax demonstrates a moderate sized densely loculated and complex pleural effusion not amenable to ultrasound-guided thoracentesis. Note is made of a trace left-sided pleural effusion, too small to allow for safe ultrasound-guided thoracentesis. No thoracentesis attempted. IMPRESSION: 1. Moderate sized densely loculated and complex right-sided pleural effusion not amenable to ultrasound-guided thoracentesis. 2. Trace left-sided pleural effusion, too small to allow for safe ultrasound-guided thoracentesis. PLAN: Recommend contrast-enhanced chest CT which, pending the results, may warrant cardiothoracic surgery evaluation for consideration of chest tube versus definitive VATS. Above discussed with Dr. Roosevelt Locks at the time of soft tissue chest ultrasound completion.  Electronically Signed   By: Sandi Mariscal M.D.   On: 01/01/2020 12:52   DG Chest Portable 1 View  Result Date: 12/26/2019 CLINICAL DATA:  Possible pneumonia, altered mental status EXAM: PORTABLE CHEST 1 VIEW COMPARISON:  Portable exam 1518 hours compared to 11/01/2019 FINDINGS: Normal heart size, mediastinal contours, and pulmonary vascularity. Atherosclerotic calcification aorta. New RIGHT basilar infiltrate consistent with pneumonia likely involving RIGHT middle and RIGHT lower lobes. Small parapneumonic effusion at RIGHT base. Minimal atelectasis or infiltrate at medial LEFT base. Remaining lungs clear. No pneumothorax or acute osseous findings. IMPRESSION: New RIGHT basilar infiltrate consistent with pneumonia. Small parapneumonic RIGHT pleural effusion. Question minimal atelectasis or infiltrate at medial LEFT base. Electronically Signed   By: Lavonia Dana M.D.   On: 12/26/2019 15:40    Labs:  CBC: Recent Labs    12/31/19 0550 01/01/20 0357 01/02/20 0359 01/03/20 0100  WBC 15.4* 15.9* 15.0* 15.7*  HGB 11.6* 11.5* 11.6* 10.5*  HCT 36.4* 37.3* 37.5* 34.1*  PLT 333 319 374 390    COAGS: Recent Labs    01/01/20 1018  INR 1.1  APTT 38*    BMP: Recent Labs    09/21/19 0759 09/21/19 0759 09/22/19 0739 09/22/19 0739 11/01/19 2257 11/07/19 0413 11/12/19 1051 12/26/19 1510 12/31/19 0550 01/01/20 0357 01/02/20 0359 01/03/20 0100  NA 139   < > 137   < > 138   < > 145   < > 138 136 136 136  K 3.9   < > 3.9   < > 4.7   < > 4.7   < > 4.1 4.2 4.1 4.0  CL 108   < > 105   < > 102   < > 106   < > 100 101 100 99  CO2 20*   < > 23   < > 28   < > 20   < > 27 27 27 25   GLUCOSE 90   < > 88   < > 89   < > 83   < > 117* 119* 97 105*  BUN 21   < > 25*   < > 16   < > 34*   < > 22 19  17 14  CALCIUM 8.2*   < > 8.5*   < > 9.2   < > 9.3   < > 8.4* 8.3* 8.2* 8.3*  CREATININE 1.07   < > 1.07   < > 1.29*   < > 1.20*   < > 1.11 0.87 0.92 1.07  GFRNONAA >60   < > >60   < > 53*   < > 58*   < > >60  >60 >60 >60  GFRAA >60  --  >60  --  >60  --  67  --   --   --   --   --    < > = values in this interval not displayed.    LIVER FUNCTION TESTS: Recent Labs    09/19/19 0021 11/01/19 2257 12/26/19 1510 01/03/20 0100  BILITOT 0.8 1.0 0.9 0.8  AST 20 26 23 24   ALT 12 19 18 19   ALKPHOS 97 107 85 65  PROT 7.0 7.9 8.0 6.4*  ALBUMIN 3.5 4.2 3.2* 2.0*    TUMOR MARKERS: No results for input(s): AFPTM, CEA, CA199, CHROMGRNA in the last 8760 hours.  Assessment and Plan:  79 y/o M recently admitted for AMS and pneumonia found to have worsening right sided loculated pleural fluid collection concerning for empyema despite IV antibiotic therapy. IR has been asked to place a chest tube for drainage.  Patient imaging and history reviewed by Dr. Earleen Newport today who agrees to procedure likely this afternoon. Patient to remain NPO, may have sips with medications. Last dose Plavix 75 mg and Lovenox 40 mg today at 1050. Afebrile, WBC 15.7, hgb 10.5, plt 390, INR 1.1 (12/1). Currently receiving Zosyn per primary team.  Risks and benefits discussed with the patient including bleeding, infection, damage to adjacent structures, pneumothorax and sepsis.  All of the patient's questions were answered, patient is agreeable to proceed.  Consent signed and in chart.   Thank you for this interesting consult.  I greatly enjoyed meeting Kashtyn Jankowski and look forward to participating in their care.  A copy of this report was sent to the requesting provider on this date.  Electronically Signed: Joaquim Nam, PA-C 01/03/2020, 12:37 PM   I spent a total of 40 Minutes  in face to face in clinical consultation, greater than 50% of which was counseling/coordinating care for right chest tube placement.

## 2020-01-03 NOTE — Procedures (Signed)
Interventional Radiology Procedure Note  Procedure: Placement of a right chest tube.  73F [place].  To pleural evac system.   Findings: US shows complex loculated fluid.  Aspirated fluid for culture  Complications: None  Recommendations:  - TO pleur evac system - Do not submerge - follow up culture    Signed,  Dulcy Fanny. Earleen Newport, DO

## 2020-01-03 NOTE — Consult Note (Signed)
   [facility] Inpatient Consult   01/03/2020  [name] [name] [birth year] [id number]  Patient chart has been reviewed for readmissions less than 30 days and for high risk score for unplanned readmissions.  Patient assessed for community [facility] CM) follow up needs.  Chart review reveals patient from SNF and current plan is for SNF. No [facility] CM needs.  Netta Cedars, MSN, [facility] Liaison Nurse Mobile Phone [phone]  Toll free office [phone]

## 2020-01-03 NOTE — Consult Note (Signed)
[address]       [place] [address]             [phone]        Gable Martin Szczesny Chapin Medical Record #[id number] Date of Birth: 1940-07-11  Referring: [facility] hospitalist  primary Care [name], [name], MD Primary Cardiologist:No primary care provider on file.  Chief Complaint:   Shortness of breath failure to thrive  History of Present Illness:     Frail 79 year old AA male who is been hospitalized or in a nursing home since October when he had a stroke.  The patient developed shortness of breath altered mental status and failure to thrive and was admitted to [facility] on November 25.  He was diagnosed with right lower lobe and right middle lobe pneumonia with a parapneumonic right effusion.  He was treated with antibiotics and pulmonary toilet improvement in oxygenation but progression of the right pleural effusion.  ET scan demonstrated this to be significant with compressive atelectasis of the right lung.  Ultrasound-guided right thoracentesis was not performed for unclear reasons at [facility]  The patient was transferred to this hospital for evaluation of treatment of the probable empyema as recommended by general surgery at [facility] since the thoracic surgeon was on leave The patient's blood cultures have been negative.  His white count is 15,000.  He has been on antibiotics and currently is on Zosyn.  I have personally examined the patient and spoken with his sister. I have reviewed the CT scan performed recently at [facility].  The patient is very weak frail and nonambulatory.  He has lost significant weight according to his sister over the past 2 to 3 months.  His albumin is 2.0 and he has peripheral edema.  Patient is on chronic Plavix for his cerebrovascular disease.  He has altered mental status and can answer only simple questions. His MRI at [facility] 8 weeks ago indicated an acute right MCA  distribution stroke with significant chronic small vessel ischemic disease.  Patient would benefit from drainage of the right parapneumonic effusion/empyema but would not be a candidate for general surgery and VATS.  Chest tube drainage will be his best option and I would recommend first trying CT-guided chest tube placement in IR for optimal placement of the tube.  If that is not successful then a surgical tube under minimal anesthesia would be the next option.  I discussed the situation with the patient's sister and she understands and agrees.  The patient also agrees to a CT-guided chest tube placement later today by IR.  Current Activity/ Functional Status: Nursing home dependent   Zubrod Score: At the time of surgery this patient's most appropriate activity status/level should be described as: []     0    Normal activity, no symptoms []     1    Restricted in physical strenuous activity but ambulatory, able to do out light work []     2    Ambulatory and capable of self care, unable to do work activities, up and about                 more than 50%  Of the time                            []     3    Only limited self care, in bed greater than 50% of  waking hours [x]     4    Completely disabled, no self care, confined to bed or chair []     5    Moribund  Past Medical History:  Diagnosis Date  . Back pain   . Renal disorder    kidney stones  . Spinal stenosis, lumbar region, with neurogenic claudication 07/07/2014    Past Surgical History:  Procedure Laterality Date  . CATARACT EXTRACTION W/PHACO Left 08/22/2016   Procedure: CATARACT EXTRACTION PHACO AND INTRAOCULAR LENS PLACEMENT ([place]) Left;  Surgeon: [name], MD;  Location: [place];  Service: Ophthalmology;  Laterality: Left;  . CATARACT EXTRACTION W/PHACO Right 10/25/2016   Procedure: CATARACT EXTRACTION PHACO AND INTRAOCULAR LENS PLACEMENT ([place]) WITH ISTENT RIGHT  ;  Surgeon: [name], MD;  Location: [place];  Service: Ophthalmology;  Laterality: Right;  TOPICAL RIGHT  ISTENT   trabecular bypass stent was implanted/explanted.  . INGUINAL HERNIA REPAIR    . KNEE SURGERY Right   . NECK SURGERY      Social History   Tobacco Use  Smoking Status Current Every Day Smoker  . Packs/day: 0.75  . Types: Cigarettes  Smokeless Tobacco Never Used    Social History   Substance and Sexual Activity  Alcohol Use No     No Known Allergies  Current Facility-Administered Medications  Medication Dose Route Frequency Provider Last Rate Last Admin  . aspirin EC tablet 81 mg  81 mg Oral Daily Irene Pap N, DO   81 mg at 01/03/20 1049  . clopidogrel (PLAVIX) tablet 75 mg  75 mg Oral Daily Irene Pap N, DO   75 mg at 01/03/20 1049  . DULoxetine (CYMBALTA) DR capsule 60 mg  60 mg Oral Daily Irene Pap N, DO   60 mg at 01/03/20 1049  . enoxaparin (LOVENOX) injection 40 mg  40 mg Subcutaneous Daily Irene Pap N, DO   40 mg at 01/03/20 1050  . losartan (COZAAR) tablet 50 mg  50 mg Oral Daily Dessa Phi, DO      . ondansetron [facility]) injection 4 mg  4 mg Intravenous Q6H PRN Irene Pap N, DO      . piperacillin-tazobactam (ZOSYN) IVPB 3.375 g  3.375 g Intravenous Q8H Lyndee Leo, RPH 12.5 mL/hr at 01/03/20 0024 3.375 g at 01/03/20 0024  . rosuvastatin (CRESTOR) tablet 10 mg  10 mg Oral Daily Irene Pap N, DO   10 mg at 01/03/20 1049    Medications Prior to Admission  Medication Sig Dispense Refill Last Dose  . cefTRIAXone 2 g in sodium chloride 0.9 % 100 mL Inject 2 g into the vein daily.   01/02/2020 at Unknown time  . DULoxetine (CYMBALTA) 60 MG capsule Take 1 capsule (60 mg total) by mouth daily. 90 capsule 2 01/02/2020 at Unknown time  . enoxaparin (LOVENOX) 40 MG/0.4ML injection Inject 0.4 mLs (40 mg total) into the skin daily. 0 mL  01/02/2020 at Unknown time  . feeding supplement, ENSURE ENLIVE, (ENSURE ENLIVE) LIQD Take 237 mLs by mouth 3 (three) times daily between  meals. 237 mL 12 01/02/2020 at Unknown time  . furosemide (LASIX) 20 MG tablet 2 tabs daily for 5 days then 1-2 tabs daily as needed for leg swelling. 30 tablet 3 unk  . guaifenesin (ROBITUSSIN) 100 MG/5ML syrup Take 200 mg by mouth 3 (three) times daily as needed for cough.   unk  . losartan (COZAAR) 50 MG tablet Take 1 tablet (50 mg  total) by mouth daily. 90 tablet 2 01/02/2020 at Unknown time  . nicotine (NICODERM CQ - DOSED IN MG/24 HOURS) 21 mg/24hr patch Place 1 patch (21 mg total) onto the skin daily. 28 patch 0 01/02/2020 at Unknown time  . Oxycodone HCl 10 MG TABS Take 1 tablet (10 mg total) by mouth 2 (two) times daily as needed (for moderate - severe pain). 60 tablet 0 unk  . polyethylene glycol (MIRALAX / GLYCOLAX) 17 g packet Take 34 g by mouth 2 (two) times daily.  0 01/02/2020 at Unknown time  . potassium chloride SA (KLOR-CON) 20 MEQ tablet Take 1 tablet (20 mEq total) by mouth daily as needed (When taking fluid pill.). 30 tablet 3 unk  . rosuvastatin (CRESTOR) 10 MG tablet Take 1 tablet (10 mg total) by mouth daily. 90 tablet 1 01/02/2020 at Unknown time  . ARTIFICIAL TEARS 0.2-0.2-1 % SOLN Place 2 drops into both eyes in the morning and at bedtime.      [name] aspirin EC 81 MG tablet Take 81 mg by mouth daily. Swallow whole.     . clopidogrel (PLAVIX) 75 MG tablet Take 1 tablet (75 mg total) by mouth daily. 30 tablet 3     Family History  Problem Relation Age of Onset  . Diabetes Mother   . Diabetes Brother   . Diabetes Sister      Review of Systems:   ROS      Cardiac Review of Systems: Y or  [    ]= no  Chest Pain [    ]  Resting SOB [   ] Exertional SOB  Blue.Reese  ]  Orthopnea Blue.Reese  ]   Pedal Edema Blue.Reese   ]    Palpitations [  ] Syncope  [  ]   Presyncope [   ]  General Review of Systems: [Y] = yes [  ]=no Constitional: recent weight change Blue.Reese  ]; anorexia Blue.Reese ]; fatigue Blue.Reese  ]; nausea [  ]; night sweats [  ]; fever [  ]; or chills [  ]                                                                Dental: Last Dentist visit: Unknown  Eye : blurred vision [  ]; diplopia [   ]; vision changes [  ];  Amaurosis fugax[  ]; Resp: cough [  ];  wheezing[  ];  hemoptysis[  ]; shortness of breath[ y ]; paroxysmal nocturnal dyspnea[  ]; dyspnea on exertion[ y ]; or orthopnea[  ]; history of smoking GI:  gallstones[  ], vomiting[  ];  dysphagia[  ]; melena[  ];  hematochezia [  ]; heartburn[  ];   Hx of  Colonoscopy[  ]; GU: kidney stones [  ]; hematuria[  ];   dysuria [  ];  nocturia[  ];  history of     obstruction [  ]; urinary frequency [  ]             Skin: rash, swelling[  ];, hair loss[  ];  peripheral edema[  ];  or itching[  ]; Musculosketetal: myalgias[  ];  joint swelling[  ];  joint erythema[  ]; muscle atrophy  joint pain[  ];  back pain[yes-spinal stenosis];  Heme/Lymph: bruising[  ];  bleeding[  ];  anemia[  ];  Neuro: TIA[  ];  headaches[  ];  stroke[  ];  vertigo[  ];  seizures[  ];   paresthesias[  ];  difficulty walking[  ];  Psych:depression[  ]; anxiety[  ];  Endocrine: diabetes[  ];  thyroid dysfunction[  ];                Physical Exam: BP 130/79 (BP Location: Left Arm)   Pulse 85   Temp 98.2 F (36.8 C) (Oral)   Resp 16   SpO2 95%   Elderly male sitting in recliner who appears chronically ill fragile and confused. No palpable cervical adenopathy or carotid bruit Breath sounds diminished at the right base with tubular breath sounds Heart rhythm regular with ectopy no murmur Abdomen nontender, scaphoid Extremities with nonpalpable pedal pulses and 2+ pedal edema  Diagnostic Studies & Laboratory data:     Recent Radiology Findings:   CT CHEST W CONTRAST  Result Date: 01/01/2020 CLINICAL DATA:  Abnormal chest radiograph.  Short of breath. EXAM: CT CHEST WITH CONTRAST TECHNIQUE: Multidetector CT imaging of the chest was performed during intravenous contrast administration. CONTRAST:  48mL OMNIPAQUE IOHEXOL 300 MG/ML  SOLN COMPARISON:  Current chest  radiograph.  CT, 03/07/2005. FINDINGS: Cardiovascular: Heart is normal in size. No pericardial effusion. Three-vessel coronary artery calcifications. Aorta is normal in caliber. Minor atherosclerosis. Mediastinum/Nodes: Normal thyroid. No neck base, axillary, mediastinal or hilar masses or enlarged lymph nodes. Trachea and esophagus are unremarkable. Lungs/Pleura: Moderate size loculated right pleural effusion. Minimal left pleural effusion. Consolidation/atelectasis of the majority of the right lower lobe. Mild dependent atelectasis in the right middle lobe. Peripheral linear/reticular opacities in the dependent right upper lobe, also likely atelectasis. Non dependent aspects of the right upper and middle lobes are clear. Minor dependent atelectasis in the left lower lobe. Remainder of the left lung is clear. Mild emphysema. No pneumothorax. Upper Abdomen: No acute findings. Musculoskeletal: No fracture or acute finding. No bone lesion. No chest wall mass. IMPRESSION: 1. Moderate-size loculated right pleural effusion, which may reflect an empyema. Most of the right lower lobe is opacified consistent with pneumonia, atelectasis or a combination. 2. Additional mild dependent linear/reticular atelectasis in the right upper and middle lobes and left lower lobe. Trace left pleural effusion. 3. No visualized centrally obstructing mass. 4. Emphysema and mild aortic atherosclerosis. Aortic Atherosclerosis (ICD10-I70.0) and Emphysema (ICD10-J43.9). Electronically Signed   By: Lajean Manes M.D.   On: 01/01/2020 17:06     I have independently reviewed the above radiologic studies and discussed with the patient   Recent Lab Findings: Lab Results  Component Value Date   WBC 15.7 (H) 01/03/2020   HGB 10.5 (L) 01/03/2020   HCT 34.1 (L) 01/03/2020   PLT 390 01/03/2020   GLUCOSE 105 (H) 01/03/2020   CHOL 144 11/04/2019   TRIG 77 11/04/2019   HDL 68 11/04/2019   LDLCALC 61 11/04/2019   ALT 19 01/03/2020   AST 24  01/03/2020   NA 136 01/03/2020   K 4.0 01/03/2020   CL 99 01/03/2020   CREATININE 1.07 01/03/2020   BUN 14 01/03/2020   CO2 25 01/03/2020   TSH 2.613 11/01/2019   INR 1.1 01/01/2020   HGBA1C 5.7 (H) 11/04/2019      Assessment / Plan:    Right parapneumonic effusion/empyema with significant right lower lobe and moderate right middle lobe consolidation-pneumonia.  Associated acute respiratory  insufficiency secondary to hypoxia, improved.  Recent right MCA distribution CVA with altered mental status Nonambulatory due to stroke and spinal stenosis Frail with malnutrition, generalized weakness  Patient not a candidate for formal right VATS and drainage decortication as discussed above.  Best therapy would be chest tube drainage and IR will evaluate the patient for CT-guided chest tube drainage to culture fluid and to send fluid for cytology.  Plan discussed with patient's sister and patient.  They understand and agree.       01/03/2020 1:46 PM

## 2020-01-04 ENCOUNTER — Inpatient Hospital Stay (HOSPITAL_COMMUNITY): Payer: Medicare HMO

## 2020-01-04 DIAGNOSIS — J80 Acute respiratory distress syndrome: Secondary | ICD-10-CM | POA: Diagnosis not present

## 2020-01-04 DIAGNOSIS — J90 Pleural effusion, not elsewhere classified: Secondary | ICD-10-CM | POA: Diagnosis not present

## 2020-01-04 DIAGNOSIS — J869 Pyothorax without fistula: Secondary | ICD-10-CM | POA: Diagnosis not present

## 2020-01-04 LAB — CBC
HCT: 33.4 % — ABNORMAL LOW (ref 39.0–52.0)
Hemoglobin: 10.6 g/dL — ABNORMAL LOW (ref 13.0–17.0)
MCH: 25.9 pg — ABNORMAL LOW (ref 26.0–34.0)
MCHC: 31.7 g/dL (ref 30.0–36.0)
MCV: 81.7 fL (ref 80.0–100.0)
Platelets: 429 10*3/uL — ABNORMAL HIGH (ref 150–400)
RBC: 4.09 MIL/uL — ABNORMAL LOW (ref 4.22–5.81)
RDW: 13.2 % (ref 11.5–15.5)
WBC: 16.8 10*3/uL — ABNORMAL HIGH (ref 4.0–10.5)
nRBC: 0 % (ref 0.0–0.2)

## 2020-01-04 MED ORDER — TRAMADOL HCL 50 MG PO TABS
50.0000 mg | ORAL_TABLET | Freq: Once | ORAL | Status: AC
  Administered 2020-01-04: 50 mg via ORAL
  Filled 2020-01-04: qty 1

## 2020-01-04 NOTE — Progress Notes (Signed)
Referring Physician(s): Choi,J/Van Trigt,P  Supervising Physician: Markus Daft  Patient Status:  [name] - In-pt  Chief Complaint:  Loculated right pleural effusion  Subjective: Pt awake; currently denies worsening CP/dyspnea or cough; states breathing a little better since chest drain placed yesterday   Allergies: Patient has no known allergies.  Medications: Prior to Admission medications   Medication Sig Start Date End Date Taking? Authorizing Provider  cefTRIAXone 2 g in sodium chloride 0.9 % 100 mL Inject 2 g into the vein daily. 01/02/20  Yes [name], MD  DULoxetine (CYMBALTA) 60 MG capsule Take 1 capsule (60 mg total) by mouth daily. 10/15/19  Yes [name], [name], MD  enoxaparin (LOVENOX) 40 MG/0.4ML injection Inject 0.4 mLs (40 mg total) into the skin daily. 01/02/20  Yes [name], MD  feeding supplement, ENSURE ENLIVE, (ENSURE ENLIVE) LIQD Take 237 mLs by mouth 3 (three) times daily between meals. 11/07/19  Yes [name], MD  furosemide (LASIX) 20 MG tablet 2 tabs daily for 5 days then 1-2 tabs daily as needed for leg swelling. 10/16/19  Yes [name], [name], MD  guaifenesin (ROBITUSSIN) 100 MG/5ML syrup Take 200 mg by mouth 3 (three) times daily as needed for cough.   Yes [provider]  losartan (COZAAR) 50 MG tablet Take 1 tablet (50 mg total) by mouth daily. 10/15/19  Yes [name], [name], MD  nicotine (NICODERM CQ - DOSED IN MG/24 HOURS) 21 mg/24hr patch Place 1 patch (21 mg total) onto the skin daily. 09/23/19  Yes [name], MD  Oxycodone HCl 10 MG TABS Take 1 tablet (10 mg total) by mouth 2 (two) times daily as needed (for moderate - severe pain). 10/16/19  Yes [name], [name], MD  polyethylene glycol (MIRALAX / GLYCOLAX) 17 g packet Take 34 g by mouth 2 (two) times daily. 09/22/19  Yes [name], MD  potassium chloride SA (KLOR-CON) 20 MEQ tablet Take 1 tablet (20 mEq total) by mouth daily as needed (When taking fluid pill.). 10/16/19  Yes [name], [name],  MD  rosuvastatin (CRESTOR) 10 MG tablet Take 1 tablet (10 mg total) by mouth daily. 10/16/19  Yes [name], [name], MD  ARTIFICIAL TEARS 0.2-0.2-1 % SOLN Place 2 drops into both eyes in the morning and at bedtime.  11/15/19   [provider]  aspirin EC 81 MG tablet Take 81 mg by mouth daily. Swallow whole.    [provider]  clopidogrel (PLAVIX) 75 MG tablet Take 1 tablet (75 mg total) by mouth daily. 11/08/19   [name], MD     Vital Signs: BP (!) 95/58 (BP Location: Left Arm)   Pulse 76   Temp 98 F (36.7 C) (Oral)   Resp 17   Ht 5\' 10"  (1.778 m)   Wt 147 lb 4.3 oz (66.8 kg)   SpO2 98%   BMI 21.13 kg/m   Physical Exam awake, answers few simple questions ok; rt chest drain intact, dressing dry, OP about 140 cc total yellow fluid; no obvious air leak to pleuravac; tube to wall suction  Imaging: DG Chest 2 View  Result Date: 01/01/2020 CLINICAL DATA:  Altered mental status, lobar pneumonia. EXAM: CHEST - 2 VIEW COMPARISON:  December 26, 2019. FINDINGS: Stable cardiomediastinal silhouette. No pneumothorax is noted. Left lung is clear. Moderate size right pleural effusion is noted with associated right basilar atelectasis or infiltrate. Bony thorax is unremarkable. IMPRESSION: Moderate size right pleural effusion with associated right basilar atelectasis or infiltrate.  Electronically Signed   By: Marijo Conception M.D.   On: 01/01/2020 08:22   CT CHEST W CONTRAST  Result Date: 01/01/2020 CLINICAL DATA:  Abnormal chest radiograph.  Short of breath. EXAM: CT CHEST WITH CONTRAST TECHNIQUE: Multidetector CT imaging of the chest was performed during intravenous contrast administration. CONTRAST:  69mL OMNIPAQUE IOHEXOL 300 MG/ML  SOLN COMPARISON:  Current chest radiograph.  CT, 03/07/2005. FINDINGS: Cardiovascular: Heart is normal in size. No pericardial effusion. Three-vessel coronary artery calcifications. Aorta is normal in caliber. Minor atherosclerosis.  Mediastinum/Nodes: Normal thyroid. No neck base, axillary, mediastinal or hilar masses or enlarged lymph nodes. Trachea and esophagus are unremarkable. Lungs/Pleura: Moderate size loculated right pleural effusion. Minimal left pleural effusion. Consolidation/atelectasis of the majority of the right lower lobe. Mild dependent atelectasis in the right middle lobe. Peripheral linear/reticular opacities in the dependent right upper lobe, also likely atelectasis. Non dependent aspects of the right upper and middle lobes are clear. Minor dependent atelectasis in the left lower lobe. Remainder of the left lung is clear. Mild emphysema. No pneumothorax. Upper Abdomen: No acute findings. Musculoskeletal: No fracture or acute finding. No bone lesion. No chest wall mass. IMPRESSION: 1. Moderate-size loculated right pleural effusion, which may reflect an empyema. Most of the right lower lobe is opacified consistent with pneumonia, atelectasis or a combination. 2. Additional mild dependent linear/reticular atelectasis in the right upper and middle lobes and left lower lobe. Trace left pleural effusion. 3. No visualized centrally obstructing mass. 4. Emphysema and mild aortic atherosclerosis. Aortic Atherosclerosis (ICD10-I70.0) and Emphysema (ICD10-J43.9). Electronically Signed   By: Lajean Manes M.D.   On: 01/01/2020 17:06   [place] CHEST (PLEURAL EFFUSION)  Result Date: 01/01/2020 CLINICAL DATA:  Please perform chest ultrasound an ultrasound-guided thoracentesis as indicated. EXAM: CHEST ULTRASOUND COMPARISON:  Chest radiograph-01/01/2020; 12/26/2019 FINDINGS: Sonographic evaluation of the right hemithorax demonstrates a moderate sized densely loculated and complex pleural effusion not amenable to ultrasound-guided thoracentesis. Note is made of a trace left-sided pleural effusion, too small to allow for safe ultrasound-guided thoracentesis. No thoracentesis attempted. IMPRESSION: 1. Moderate sized densely loculated and  complex right-sided pleural effusion not amenable to ultrasound-guided thoracentesis. 2. Trace left-sided pleural effusion, too small to allow for safe ultrasound-guided thoracentesis. PLAN: Recommend contrast-enhanced chest CT which, pending the results, may warrant cardiothoracic surgery evaluation for consideration of chest tube versus definitive VATS. Above discussed with Dr. Roosevelt Locks at the time of soft tissue chest ultrasound completion. Electronically Signed   By: Sandi Mariscal M.D.   On: 01/01/2020 12:52   IR PERC PLEURAL DRAIN W/INDWELL CATH W/IMG GUIDE  Result Date: 01/03/2020 INDICATION: 79 year old male with a history of empyema EXAM: IMAGE GUIDED PLACEMENT OF RIGHT THORACOSTOMY TUBE MEDICATIONS: The patient is currently admitted to the [name] and receiving intravenous antibiotics. The antibiotics were administered within an appropriate time frame prior to the initiation of the procedure. ANESTHESIA/SEDATION: Fentanyl 25 mcg IV; Versed 51.0 mg IV Moderate Sedation Time:  15 minutes The patient was continuously monitored during the procedure by the interventional radiology nurse under my direct supervision. COMPLICATIONS: None PROCEDURE: The procedure, risks, benefits, and alternatives were explained to the patient/patient's family, who provided informed consent on the patient's behalf. Specific risks that were addressed included bleeding, infection, ongoing pneumothorax, need for further procedure/surgery, chance of hemorrhage, hemoptysis, cardiopulmonary collapse, death. Questions regarding the procedure were encouraged and answered. The patient understands and consents to the procedure. Patient was positioned in the right anterior oblique position on the IR table and scout image  of the chest was performed for planning purposes. The right mid axillary line at the level of the nipple was identified, and prepped and draped in the usual sterile fashion. The skin and subcutaneous tissues were generously  infiltrated 1% lidocaine for local anesthesia. A trocar needle was then used to enter the pleural space with ultrasound guidance. The inner stylet was removed and an 035 guidewire was advanced into the pleural space under fluoroscopy. Dilation of the skin tract was performed over the wire, and then modified Seldinger technique was used to place a 20 [place] Thal catheter. Sample was aspirated. Catheter was attached to water seal chamber and suction was applied confirming a operational chest tube. Final image was stored. Retention suture was placed.  Sterile dressing was placed. Patient tolerated the procedure well and remained hemodynamically stable throughout. No complications were encountered and no significant blood loss was encounter IMPRESSION: Status post image guided placement of right thoracostomy tube. Signed, Dulcy Fanny. Dellia Nims, RPVI [name] Electronically Signed   By: Corrie Mckusick D.O.   On: 01/03/2020 16:44    Labs:  CBC: Recent Labs    01/01/20 0357 01/02/20 0359 01/03/20 0100 01/04/20 0302  WBC 15.9* 15.0* 15.7* 16.8*  HGB 11.5* 11.6* 10.5* 10.6*  HCT 37.3* 37.5* 34.1* 33.4*  PLT 319 374 390 429*    COAGS: Recent Labs    01/01/20 1018  INR 1.1  APTT 38*    BMP: Recent Labs    09/21/19 0759 09/21/19 0759 09/22/19 0739 09/22/19 0739 11/01/19 2257 11/07/19 0413 11/12/19 1051 12/26/19 1510 12/31/19 0550 01/01/20 0357 01/02/20 0359 01/03/20 0100  NA 139   < > 137   < > 138   < > 145   < > 138 136 136 136  K 3.9   < > 3.9   < > 4.7   < > 4.7   < > 4.1 4.2 4.1 4.0  CL 108   < > 105   < > 102   < > 106   < > 100 101 100 99  CO2 20*   < > 23   < > 28   < > 20   < > 27 27 27 25   GLUCOSE 90   < > 88   < > 89   < > 83   < > 117* 119* 97 105*  BUN 21   < > 25*   < > 16   < > 34*   < > 22 19 17 14   CALCIUM 8.2*   < > 8.5*   < > 9.2   < > 9.3   < > 8.4* 8.3* 8.2* 8.3*  CREATININE 1.07   < > 1.07   < > 1.29*    < > 1.20*   < > 1.11 0.87 0.92 1.07  GFRNONAA >60   < > >60   < > 53*   < > 58*   < > >60 >60 >60 >60  GFRAA >60  --  >60  --  >60  --  67  --   --   --   --   --    < > = values in this interval not displayed.    LIVER FUNCTION TESTS: Recent Labs    09/19/19 0021 11/01/19 2257 12/26/19 1510 01/03/20 0100  BILITOT 0.8 1.0 0.9 0.8  AST 20 26 23 24   ALT 12 19 18 19   ALKPHOS 97 107 85  65  PROT 7.0 7.9 8.0 6.4*  ALBUMIN 3.5 4.2 3.2* 2.0*    Assessment and Plan: Pt with hx COPD,recent CVA, dementia, HTN,  PNA with loculated right pleural effusion; s/p rt chest drain placement 12/3; afebrile; WBC 16.8(15.7), hgb stable; pleural fl cx pend; will check CXR; cont drain/monitor OP/lab checks/antbx per primary team; check final cx   Electronically Signed: D. Rowe Robert, PA-C 01/04/2020, 9:15 AM   I spent a total of 15 minutes at the the patient's bedside AND on the patient's [name] floor or unit, greater than 50% of which was counseling/coordinating care for right chest drain    Patient ID: [name], male   DOB: Jul 03, 1940, 79 y.o.   MRN: [id number]

## 2020-01-04 NOTE — Progress Notes (Signed)
PROGRESS NOTE    [name] [name]  WCH:[id number] DOB: 1940-12-16 DOA: 01/02/2020 PCP: [name], [name], MD     Brief Narrative:  [name] [name] is a 79 y.o. 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  Work-up revealed right-sided loculated pleural effusion with concern for empyema.  On 01/02/2020 patient was seen by general surgery at [facility] with recommendation to transfer to [facility] for CTS evaluation.  He was seen by interventional radiology at [facility], had an attempted ultrasound-guided chest tube placement.  IR was unable to place a chest tube due to the thickness of the pleural fluid.  Imaging consistent with right-sided empyema and evidence of possible pneumonia.  New events last 24 hours / Subjective: States that his breathing is better today.  No other new complaints  Assessment & Plan:   Active Problems:   Empyema of lung (HCC)   Right-sided parapneumonic effusion/empyema -Rocephin --> Zosyn -Cardiothoracic surgery consulted, no surgical recommendation but did recommend pigtail chest tube placement by IR -IR placed right sided chest tube 12/3 -Pleural culture pending  Acute hypoxemic respiratory failure -Currently on 3 L nasal cannula O2 without distress  History of CVA in October 2021 with residual dysarthria -Continue aspirin, Plavix, Crestor  Essential hypertension -Continue Cozaar  Anxiety/depression -Continue Cymbalta  Dementia -With mild cognitive impairment without behavioral disturbance  DVT prophylaxis:  enoxaparin (LOVENOX) injection 40 mg Start: 01/03/20 1000  Code Status: Full code Family Communication: None at bedside Disposition Plan:  Status is: Inpatient  Remains inpatient appropriate  because:IV treatments appropriate due to intensity of illness or inability to take PO and Inpatient level of care appropriate due to severity of illness   Dispo:  Patient From: [place]  Planned Disposition: [place]  Expected discharge date: 01/06/20  Medically stable for discharge: No.  Chest tube in place.  Culture pending.  Continue IV Zosyn     Consultants:   Cardiothoracic surgery  IR  Procedures:   Right chest tube placed by IR 12/3  Antimicrobials:  Anti-infectives (From admission, onward)   Start     Dose/Rate Route Frequency Ordered Stop   01/02/20 2330  piperacillin-tazobactam (ZOSYN) IVPB 3.375 g        3.375 g 12.5 mL/hr over 240 Minutes Intravenous Every 8 hours 01/02/20 2242         Objective: Vitals:   01/03/20 1946 01/03/20 2030 01/03/20 2300 01/04/20 0500  BP: 112/63 117/62 122/69 (!) 95/58  Pulse: 72 80 80 76  Resp: 18 17  17   Temp: 98.1 F (36.7 C) 98.1 F (36.7 C) (!) 97.5 F (36.4 C) 98 F (36.7 C)  TempSrc: Oral Oral Oral Oral  SpO2: 100% 100% 100% 98%  Weight:      Height:        Intake/Output Summary (Last 24 hours) at 01/04/2020 1246 Last data filed at 01/04/2020 0600 Gross per 24 hour  Intake --  Output 120 ml  Net -120 ml   Filed Weights   01/03/20 1414  Weight: 66.8 kg    Examination: General exam: Appears calm and comfortable  Respiratory system: Clear to auscultation anteriorly. Respiratory effort normal.  Chest tube in place Cardiovascular system: S1 & S2 heard, RRR. No pedal edema. Gastrointestinal system: Abdomen is nondistended, soft and nontender. Normal bowel sounds heard. Central nervous system:  Alert  Extremities: Symmetric in appearance bilaterally  Skin: No rashes, lesions or ulcers on exposed skin  Psychiatry: Stable  Data Reviewed: I have personally reviewed following labs and imaging studies  CBC: Recent Labs  Lab 12/31/19 0550 01/01/20 0357 01/02/20 0359  01/03/20 0100 01/04/20 0302  WBC 15.4* 15.9* 15.0* 15.7* 16.8*  NEUTROABS  --   --  11.6*  --   --   HGB 11.6* 11.5* 11.6* 10.5* 10.6*  HCT 36.4* 37.3* 37.5* 34.1* 33.4*  MCV 81.4 83.3 82.4 82.4 81.7  PLT 333 319 374 390 967*   Basic Metabolic Panel: Recent Labs  Lab 12/30/19 0344 12/31/19 0550 01/01/20 0357 01/02/20 0359 01/03/20 0100  NA 137 138 136 136 136  K 4.1 4.1 4.2 4.1 4.0  CL 102 100 101 100 99  CO2 23 27 27 27 25   GLUCOSE 110* 117* 119* 97 105*  BUN 22 22 19 17 14   CREATININE 0.99 1.11 0.87 0.92 1.07  CALCIUM 8.4* 8.4* 8.3* 8.2* 8.3*  MG  --   --   --  2.0  --    GFR: Estimated Creatinine Clearance: 53.8 mL/min (by C-G formula based on SCr of 1.07 mg/dL). Liver Function Tests: Recent Labs  Lab 01/03/20 0100  AST 24  ALT 19  ALKPHOS 65  BILITOT 0.8  PROT 6.4*  ALBUMIN 2.0*   No results for input(s): LIPASE, AMYLASE in the last 168 hours. No results for input(s): AMMONIA in the last 168 hours. Coagulation Profile: Recent Labs  Lab 01/01/20 1018  INR 1.1   Cardiac Enzymes: No results for input(s): CKTOTAL, CKMB, CKMBINDEX, TROPONINI in the last 168 hours. BNP (last 3 results) No results for input(s): PROBNP in the last 8760 hours. HbA1C: No results for input(s): HGBA1C in the last 72 hours. CBG: No results for input(s): GLUCAP in the last 168 hours. Lipid Profile: No results for input(s): CHOL, HDL, LDLCALC, TRIG, CHOLHDL, LDLDIRECT in the last 72 hours. Thyroid Function Tests: No results for input(s): TSH, T4TOTAL, FREET4, T3FREE, THYROIDAB in the last 72 hours. Anemia Panel: No results for input(s): VITAMINB12, FOLATE, FERRITIN, TIBC, IRON, RETICCTPCT in the last 72 hours. Sepsis Labs: No results for input(s): PROCALCITON, LATICACIDVEN in the last 168 hours.  Recent Results (from the past 240 hour(s))  Resp Panel by RT-PCR (Flu A&B, Covid) Nasopharyngeal Swab     Status: None   Collection Time: 12/26/19  3:10 PM   Specimen: Nasopharyngeal  Swab; Nasopharyngeal(NP) swabs in vial transport medium  Result Value Ref Range Status   SARS Coronavirus 2 by RT PCR NEGATIVE NEGATIVE Final    Comment: (NOTE) SARS-CoV-2 target nucleic acids are NOT DETECTED.  The SARS-CoV-2 RNA is generally detectable in upper respiratory specimens during the acute phase of infection. The lowest concentration of SARS-CoV-2 viral copies this assay can detect is 138 copies/mL. A negative result does not preclude SARS-Cov-2 infection and should not be used as the sole basis for treatment or other patient management decisions. A negative result may occur with  improper specimen collection/handling, submission of specimen other than nasopharyngeal swab, presence of viral mutation(s) within the areas targeted by this assay, and inadequate number of viral copies(<138 copies/mL). A negative result must be combined with clinical observations, patient history, and epidemiological information. The expected result is Negative.  Fact Sheet for Patients:  EntrepreneurPulse.com.au  Fact Sheet for Healthcare Providers:  IncredibleEmployment.be  This test is no t yet approved or cleared by the [place] FDA and  has been  authorized for detection and/or diagnosis of SARS-CoV-2 by FDA under an Emergency Use Authorization (EUA). This EUA will remain  in effect (meaning this test can be used) for the duration of the COVID-19 declaration under Section 564(b)(1) of the Act, 21 U.S.C.section 360bbb-3(b)(1), unless the authorization is terminated  or revoked sooner.       Influenza A by PCR NEGATIVE NEGATIVE Final   Influenza B by PCR NEGATIVE NEGATIVE Final    Comment: (NOTE) The Xpert Xpress SARS-CoV-2/FLU/RSV plus assay is intended as an aid in the diagnosis of influenza from Nasopharyngeal swab specimens and should not be used as a sole basis for treatment. Nasal washings and aspirates are unacceptable for Xpert Xpress  SARS-CoV-2/FLU/RSV testing.  Fact Sheet for Patients: EntrepreneurPulse.com.au  Fact Sheet for Healthcare Providers: IncredibleEmployment.be  This test is not yet approved or cleared by the [place] FDA and has been authorized for detection and/or diagnosis of SARS-CoV-2 by FDA under an Emergency Use Authorization (EUA). This EUA will remain in effect (meaning this test can be used) for the duration of the COVID-19 declaration under Section 564(b)(1) of the Act, 21 U.S.C. section 360bbb-3(b)(1), unless the authorization is terminated or revoked.  Performed at [facility], [address] [place], [place] [address]   Culture, blood (routine x 2)     Status: None   Collection Time: 12/26/19  3:10 PM   Specimen: BLOOD  Result Value Ref Range Status   Specimen Description BLOOD RIGHT ARM  Final   Special Requests   Final    BOTTLES DRAWN AEROBIC AND ANAEROBIC Blood Culture results may not be optimal due to an excessive volume of blood received in culture bottles   Culture   Final    NO GROWTH 5 DAYS Performed at [facility], [address], [place], [place] [address]    Report Status 12/31/2019 FINAL  Final  Culture, blood (routine x 2)     Status: None   Collection Time: 12/26/19  3:11 PM   Specimen: BLOOD  Result Value Ref Range Status   Specimen Description BLOOD LEFT FORE ARM  Final   Special Requests   Final    BOTTLES DRAWN AEROBIC AND ANAEROBIC Blood Culture adequate volume   Culture   Final    NO GROWTH 5 DAYS Performed at [facility], [address], [place], [place] [address]    Report Status 12/31/2019 FINAL  Final  MRSA PCR Screening     Status: None   Collection Time: 01/02/20  6:38 AM   Specimen: Nasal Mucosa; Nasopharyngeal  Result Value Ref Range Status   MRSA by PCR NEGATIVE NEGATIVE Final    Comment:        The GeneXpert MRSA Assay (FDA approved for NASAL specimens only), is one  component of a comprehensive MRSA colonization surveillance program. It is not intended to diagnose MRSA infection nor to guide or monitor treatment for MRSA infections. Performed at [facility], [place] [address], [place], [place] [address]   Aerobic/Anaerobic Culture (surgical/deep wound)     Status: None (Preliminary result)   Collection Time: 01/03/20  4:19 PM   Specimen: Abscess  Result Value Ref Range Status   Specimen Description ABSCESS  Final   Special Requests RIGHT PLEURAL  Final   Gram Stain   Final    RARE WBC PRESENT, PREDOMINANTLY PMN NO ORGANISMS SEEN    Culture   Final    NO GROWTH < 12 HOURS Performed at [facility], [place] [address], [place], [place] [address]    Report Status PENDING  Incomplete      Radiology Studies: DG Chest Port 1 View  Result Date: 01/04/2020 CLINICAL DATA:  Pleural effusion EXAM: PORTABLE CHEST 1 VIEW COMPARISON:  Chest x-ray dated 01/01/2020 and chest CT dated 01/01/2020 FINDINGS: The moderate-sized loculated RIGHT pleural effusion appears stable in size. Interval chest tube placement with tip directed towards the medial aspects of the RIGHT lung base. No pneumothorax is seen. Heart size and mediastinal contours are stable. IMPRESSION: 1. Status post RIGHT-sided chest tube placement. No significant change in the size or configuration of the moderate-sized loculated RIGHT pleural effusion. 2. No pneumothorax seen. Electronically Signed   By: Franki Cabot M.D.   On: 01/04/2020 11:57   IR PERC PLEURAL DRAIN W/INDWELL CATH W/IMG GUIDE  Result Date: 01/03/2020 INDICATION: 79 year old male with a history of empyema EXAM: IMAGE GUIDED PLACEMENT OF RIGHT THORACOSTOMY TUBE MEDICATIONS: The patient is currently admitted to the hospital and receiving intravenous antibiotics. The antibiotics were administered within an appropriate time frame prior to the initiation of the procedure. ANESTHESIA/SEDATION: Fentanyl 25 mcg IV; Versed 51.0 mg IV  Moderate Sedation Time:  15 minutes The patient was continuously monitored during the procedure by the interventional radiology nurse under my direct supervision. COMPLICATIONS: None PROCEDURE: The procedure, risks, benefits, and alternatives were explained to the patient/patient's family, who provided informed consent on the patient's behalf. Specific risks that were addressed included bleeding, infection, ongoing pneumothorax, need for further procedure/surgery, chance of hemorrhage, hemoptysis, cardiopulmonary collapse, death. Questions regarding the procedure were encouraged and answered. The patient understands and consents to the procedure. Patient was positioned in the right anterior oblique position on the IR table and scout image of the chest was performed for planning purposes. The right mid axillary line at the level of the nipple was identified, and prepped and draped in the usual sterile fashion. The skin and subcutaneous tissues were generously infiltrated 1% lidocaine for local anesthesia. A trocar needle was then used to enter the pleural space with ultrasound guidance. The inner stylet was removed and an 035 guidewire was advanced into the pleural space under fluoroscopy. Dilation of the skin tract was performed over the wire, and then modified Seldinger technique was used to place a 20 [place] Thal catheter. Sample was aspirated. Catheter was attached to water seal chamber and suction was applied confirming a operational chest tube. Final image was stored. Retention suture was placed.  Sterile dressing was placed. Patient tolerated the procedure well and remained hemodynamically stable throughout. No complications were encountered and no significant blood loss was encounter IMPRESSION: Status post image guided placement of right thoracostomy tube. Signed, Dulcy Fanny. Dellia Nims, RPVI [facility] Radiology Electronically Signed   By: Corrie Mckusick D.O.    On: 01/03/2020 16:44      Scheduled Meds: . aspirin EC  81 mg Oral Daily  . clopidogrel  75 mg Oral Daily  . DULoxetine  60 mg Oral Daily  . enoxaparin (LOVENOX) injection  40 mg Subcutaneous Daily  . feeding supplement  237 mL Oral TID BM  . losartan  50 mg Oral Daily  . multivitamin with minerals  1 tablet Oral Daily  . rosuvastatin  10 mg Oral Daily   Continuous Infusions: . piperacillin-tazobactam (ZOSYN)  IV 3.375 g (01/04/20 0703)     LOS: 2 days      Time spent: 25 minutes   Anderson Malta  Maylene Roes, DO [facility] 01/04/2020, 12:46 PM   Available via Epic secure chat 7am-7pm After these hours, please refer to coverage provider listed on [email or web address]

## 2020-01-04 NOTE — Progress Notes (Addendum)
      [address]       [place] [address]             [phone]           Subjective: Patient states breathing is much better since "tube put in". Objective: Vital signs in last 24 hours: Temp:  [97.5 F (36.4 C)-98.2 F (36.8 C)] 98 F (36.7 C) (12/04 0500) Pulse Rate:  [72-92] 76 (12/04 0500) Cardiac Rhythm: Normal sinus rhythm;Bundle branch block (12/04 0700) Resp:  [14-20] 17 (12/04 0500) BP: (95-151)/(58-91) 95/58 (12/04 0500) SpO2:  [95 %-100 %] 98 % (12/04 0500) Weight:  [66.8 kg] 66.8 kg (12/03 1414)      Intake/Output from previous day: 12/03 0701 - 12/04 0700 In: -  Out: 240 [Urine:120; Chest Tube:120]   Physical Exam:  Cardiovascular: RRR Pulmonary: Clear to auscultation on left and diminished right basilar breath sound Abdomen: Soft, non tender, bowel sounds present. Extremities: No lower extremity edema. Wounds: Dressing is clean and dry.   Chest Tube: to suction, no air leak  Lab Results: TM[facility]:DQQIWL Labs    01/03/20 0100 01/04/20 0302  WBC 15.7* 16.8*  HGB 10.5* 10.6*  HCT 34.1* 33.4*  PLT 390 429*   BMET:  Recent Labs    01/02/20 0359 01/03/20 0100  NA 136 136  K 4.1 4.0  CL 100 99  CO2 27 25  GLUCOSE 97 105*  BUN 17 14  CREATININE 0.92 1.07  CALCIUM 8.2* 8.3*    PT/INR:  Recent Labs    01/01/20 1018  LABPROT 14.1  INR 1.1   ABG:  INR: Will add last result for INR, ABG once components are confirmed Will add last 4 CBG results once components are confirmed  Assessment/Plan:  1. CV - SR. On Losartan 50 mg daily 2.  Pulmonary - S/p placement of 20 French right chest tube. Chest tube with 120 cc since placement. Chest tube is to suction and there is no air leak. Chest tube to remain for now. Will order CXR for am. 3. ID-Leukocytosis. WBC increased to 16,800. On Zosyn for empyema right lung.   Donielle M ZimmermanPA-C 01/04/2020,9:16 AM [phone]  I have seen and examined the patient and agree  with the assessment and plan as outlined.    Rexene Alberts, MD 01/05/2020 2:31 PM

## 2020-01-05 DIAGNOSIS — J869 Pyothorax without fistula: Secondary | ICD-10-CM | POA: Diagnosis not present

## 2020-01-05 LAB — CBC
HCT: 38.8 % — ABNORMAL LOW (ref 39.0–52.0)
Hemoglobin: 12.3 g/dL — ABNORMAL LOW (ref 13.0–17.0)
MCH: 25.9 pg — ABNORMAL LOW (ref 26.0–34.0)
MCHC: 31.7 g/dL (ref 30.0–36.0)
MCV: 81.7 fL (ref 80.0–100.0)
Platelets: 434 10*3/uL — ABNORMAL HIGH (ref 150–400)
RBC: 4.75 MIL/uL (ref 4.22–5.81)
RDW: 13.3 % (ref 11.5–15.5)
WBC: 16.3 10*3/uL — ABNORMAL HIGH (ref 4.0–10.5)
nRBC: 0 % (ref 0.0–0.2)

## 2020-01-05 MED ORDER — ACETAMINOPHEN 325 MG PO TABS
650.0000 mg | ORAL_TABLET | Freq: Four times a day (QID) | ORAL | Status: DC | PRN
  Administered 2020-01-05 – 2020-03-13 (×5): 650 mg via ORAL
  Filled 2020-01-05 (×8): qty 2

## 2020-01-05 NOTE — Progress Notes (Signed)
PROGRESS NOTE    [name] [name]  RKY:[id number] DOB: 27-Jan-1941 DOA: 01/02/2020 PCP: [name], [name], MD     Brief Narrative:  [name] [name] is a 79 y.o. 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 from [facility] due to confusion and right-sided pneumonia.  Work-up revealed right-sided loculated pleural effusion with concern for empyema.  On 01/02/2020 patient was seen by general surgery at [facility] with recommendation to transfer to [facility] for CTS evaluation.  He was seen by interventional radiology at [facility], had an attempted ultrasound-guided chest tube placement.  IR was unable to place a chest tube due to the thickness of the pleural fluid.  Imaging consistent with right-sided empyema and evidence of possible pneumonia.  New events last 24 hours / Subjective: No complaints this morning  Assessment & Plan:   Active Problems:   Empyema of lung (HCC)   Right-sided parapneumonic effusion/empyema -Rocephin --> Zosyn -Cardiothoracic surgery consulted, no surgical recommendation but did recommend pigtail chest tube placement by IR -IR placed right sided chest tube 12/3 -Pleural culture pending  Acute hypoxemic respiratory failure -Currently on 3 L nasal cannula O2 without distress  History of CVA in October 2021 with residual dysarthria -Continue aspirin, Plavix, Crestor  Essential hypertension -Continue Cozaar  Anxiety/depression -Continue Cymbalta  Dementia -With mild cognitive impairment without behavioral disturbance  DVT prophylaxis:  enoxaparin (LOVENOX) injection 40 mg Start: 01/03/20 1000  Code Status: Full code Family Communication: None at bedside Disposition Plan:  Status is: Inpatient  Remains inpatient appropriate because:IV treatments appropriate due to intensity  of illness or inability to take PO and Inpatient level of care appropriate due to severity of illness   Dispo:  Patient From: [place]  Planned Disposition: [place]  Expected discharge date: 2-3 days  Medically stable for discharge: No.  Chest tube in place.  Culture pending.  Continue IV Zosyn     Consultants:   Cardiothoracic surgery  IR  Procedures:   Right chest tube placed by IR 12/3  Antimicrobials:  Anti-infectives (From admission, onward)   Start     Dose/Rate Route Frequency Ordered Stop   01/02/20 2330  piperacillin-tazobactam (ZOSYN) IVPB 3.375 g        3.375 g 12.5 mL/hr over 240 Minutes Intravenous Every 8 hours 01/02/20 2242         Objective: Vitals:   01/04/20 1458 01/04/20 1900 01/05/20 0501 01/05/20 1129  BP: 107/66 106/65 124/70 120/75  Pulse: 79 80 67 67  Resp: 18 18 17    Temp: 97.8 F (36.6 C) 97.9 F (36.6 C) 98 F (36.7 C)   TempSrc: Oral Oral    SpO2: 97% 98% 100%   Weight:      Height:        Intake/Output Summary (Last 24 hours) at 01/05/2020 1223 Last data filed at 01/05/2020 0510 Gross per 24 hour  Intake 397 ml  Output 10 ml  Net 387 ml   Filed Weights   01/03/20 1414  Weight: 66.8 kg    Examination: General exam: Appears calm and comfortable  Respiratory system: Diminished breath sounds on right.  Chest tube in place Cardiovascular system: S1 & S2 heard, RRR. No pedal edema. Gastrointestinal system: Abdomen is nondistended, soft and nontender. Normal bowel sounds heard. Central nervous system: Alert  Extremities: Symmetric in appearance bilaterally  Skin: No rashes, lesions or  ulcers on exposed skin  Psychiatry: Stable  Data Reviewed: I have personally reviewed following labs and imaging studies  CBC: Recent Labs  Lab 01/01/20 0357 01/02/20 0359 01/03/20 0100 01/04/20 0302 01/05/20 0305  WBC 15.9* 15.0* 15.7* 16.8* 16.3*  NEUTROABS  --  11.6*  --   --   --   HGB 11.5* 11.6*  10.5* 10.6* 12.3*  HCT 37.3* 37.5* 34.1* 33.4* 38.8*  MCV 83.3 82.4 82.4 81.7 81.7  PLT 319 374 390 429* 144*   Basic Metabolic Panel: Recent Labs  Lab 12/30/19 0344 12/31/19 0550 01/01/20 0357 01/02/20 0359 01/03/20 0100  NA 137 138 136 136 136  K 4.1 4.1 4.2 4.1 4.0  CL 102 100 101 100 99  CO2 23 27 27 27 25   GLUCOSE 110* 117* 119* 97 105*  BUN 22 22 19 17 14   CREATININE 0.99 1.11 0.87 0.92 1.07  CALCIUM 8.4* 8.4* 8.3* 8.2* 8.3*  MG  --   --   --  2.0  --    GFR: Estimated Creatinine Clearance: 53.8 mL/min (by C-G formula based on SCr of 1.07 mg/dL). Liver Function Tests: Recent Labs  Lab 01/03/20 0100  AST 24  ALT 19  ALKPHOS 65  BILITOT 0.8  PROT 6.4*  ALBUMIN 2.0*   No results for input(s): LIPASE, AMYLASE in the last 168 hours. No results for input(s): AMMONIA in the last 168 hours. Coagulation Profile: Recent Labs  Lab 01/01/20 1018  INR 1.1   Cardiac Enzymes: No results for input(s): CKTOTAL, CKMB, CKMBINDEX, TROPONINI in the last 168 hours. BNP (last 3 results) No results for input(s): PROBNP in the last 8760 hours. HbA1C: No results for input(s): HGBA1C in the last 72 hours. CBG: No results for input(s): GLUCAP in the last 168 hours. Lipid Profile: No results for input(s): CHOL, HDL, LDLCALC, TRIG, CHOLHDL, LDLDIRECT in the last 72 hours. Thyroid Function Tests: No results for input(s): TSH, T4TOTAL, FREET4, T3FREE, THYROIDAB in the last 72 hours. Anemia Panel: No results for input(s): VITAMINB12, FOLATE, FERRITIN, TIBC, IRON, RETICCTPCT in the last 72 hours. Sepsis Labs: No results for input(s): PROCALCITON, LATICACIDVEN in the last 168 hours.  Recent Results (from the past 240 hour(s))  Resp Panel by RT-PCR (Flu A&B, Covid) Nasopharyngeal Swab     Status: None   Collection Time: 12/26/19  3:10 PM   Specimen: Nasopharyngeal Swab; Nasopharyngeal(NP) swabs in vial transport medium  Result Value Ref Range Status   SARS Coronavirus 2 by RT PCR  NEGATIVE NEGATIVE Final    Comment: (NOTE) SARS-CoV-2 target nucleic acids are NOT DETECTED.  The SARS-CoV-2 RNA is generally detectable in upper respiratory specimens during the acute phase of infection. The lowest concentration of SARS-CoV-2 viral copies this assay can detect is 138 copies/mL. A negative result does not preclude SARS-Cov-2 infection and should not be used as the sole basis for treatment or other patient management decisions. A negative result may occur with  improper specimen collection/handling, submission of specimen other than nasopharyngeal swab, presence of viral mutation(s) within the areas targeted by this assay, and inadequate number of viral copies(<138 copies/mL). A negative result must be combined with clinical observations, patient history, and epidemiological information. The expected result is Negative.  Fact Sheet for Patients:  EntrepreneurPulse.com.au  Fact Sheet for Healthcare Providers:  IncredibleEmployment.be  This test is no t yet approved or cleared by the [place] FDA and  has been authorized for detection and/or diagnosis of SARS-CoV-2 by FDA under an Emergency Use  Authorization (EUA). This EUA will remain  in effect (meaning this test can be used) for the duration of the COVID-19 declaration under Section 564(b)(1) of the Act, 21 U.S.C.section 360bbb-3(b)(1), unless the authorization is terminated  or revoked sooner.       Influenza A by PCR NEGATIVE NEGATIVE Final   Influenza B by PCR NEGATIVE NEGATIVE Final    Comment: (NOTE) The Xpert Xpress SARS-CoV-2/FLU/RSV plus assay is intended as an aid in the diagnosis of influenza from Nasopharyngeal swab specimens and should not be used as a sole basis for treatment. Nasal washings and aspirates are unacceptable for Xpert Xpress SARS-CoV-2/FLU/RSV testing.  Fact Sheet for Patients: EntrepreneurPulse.com.au  Fact Sheet for  Healthcare Providers: IncredibleEmployment.be  This test is not yet approved or cleared by the [place] FDA and has been authorized for detection and/or diagnosis of SARS-CoV-2 by FDA under an Emergency Use Authorization (EUA). This EUA will remain in effect (meaning this test can be used) for the duration of the COVID-19 declaration under Section 564(b)(1) of the Act, 21 U.S.C. section 360bbb-3(b)(1), unless the authorization is terminated or revoked.  Performed at [facility], [address] [place], [place] [address]   Culture, blood (routine x 2)     Status: None   Collection Time: 12/26/19  3:10 PM   Specimen: BLOOD  Result Value Ref Range Status   Specimen Description BLOOD RIGHT ARM  Final   Special Requests   Final    BOTTLES DRAWN AEROBIC AND ANAEROBIC Blood Culture results may not be optimal due to an excessive volume of blood received in culture bottles   Culture   Final    NO GROWTH 5 DAYS Performed at [facility], [address] [place], [place] [address]    Report Status 12/31/2019 FINAL  Final  Culture, blood (routine x 2)     Status: None   Collection Time: 12/26/19  3:11 PM   Specimen: BLOOD  Result Value Ref Range Status   Specimen Description BLOOD LEFT FORE ARM  Final   Special Requests   Final    BOTTLES DRAWN AEROBIC AND ANAEROBIC Blood Culture adequate volume   Culture   Final    NO GROWTH 5 DAYS Performed at [facility], [address], [place], [place] [address]    Report Status 12/31/2019 FINAL  Final  MRSA PCR Screening     Status: None   Collection Time: 01/02/20  6:38 AM   Specimen: Nasal Mucosa; Nasopharyngeal  Result Value Ref Range Status   MRSA by PCR NEGATIVE NEGATIVE Final    Comment:        The GeneXpert MRSA Assay (FDA approved for NASAL specimens only), is one component of a comprehensive MRSA colonization surveillance program. It is not intended to diagnose MRSA infection  nor to guide or monitor treatment for MRSA infections. Performed at [facility], [place] [address] [place], [place] [address]   Aerobic/Anaerobic Culture (surgical/deep wound)     Status: None (Preliminary result)   Collection Time: 01/03/20  4:19 PM   Specimen: Abscess  Result Value Ref Range Status   Specimen Description ABSCESS  Final   Special Requests RIGHT PLEURAL  Final   Gram Stain   Final    RARE WBC PRESENT, PREDOMINANTLY PMN NO ORGANISMS SEEN    Culture   Final    NO GROWTH < 12 HOURS Performed at [facility], [place] [address], [place],  [address]  Report Status PENDING  Incomplete      Radiology Studies: DG Chest Port 1 View  Result Date: 01/04/2020 CLINICAL DATA:  Pleural effusion EXAM: PORTABLE CHEST 1 VIEW COMPARISON:  Chest x-ray dated 01/01/2020 and chest CT dated 01/01/2020 FINDINGS: The moderate-sized loculated RIGHT pleural effusion appears stable in size. Interval chest tube placement with tip directed towards the medial aspects of the RIGHT lung base. No pneumothorax is seen. Heart size and mediastinal contours are stable. IMPRESSION: 1. Status post RIGHT-sided chest tube placement. No significant change in the size or configuration of the moderate-sized loculated RIGHT pleural effusion. 2. No pneumothorax seen. Electronically Signed   By: Franki Cabot M.D.   On: 01/04/2020 11:57   IR PERC PLEURAL DRAIN W/INDWELL CATH W/IMG GUIDE  Result Date: 01/03/2020 INDICATION: 79 year old male with a history of empyema EXAM: IMAGE GUIDED PLACEMENT OF RIGHT THORACOSTOMY TUBE MEDICATIONS: The patient is currently admitted to the hospital and receiving intravenous antibiotics. The antibiotics were administered within an appropriate time frame prior to the initiation of the procedure. ANESTHESIA/SEDATION: Fentanyl 25 mcg IV; Versed 51.0 mg IV Moderate Sedation Time:  15 minutes The patient was continuously monitored during the procedure by the interventional  radiology nurse under my direct supervision. COMPLICATIONS: None PROCEDURE: The procedure, risks, benefits, and alternatives were explained to the patient/patient's family, who provided informed consent on the patient's behalf. Specific risks that were addressed included bleeding, infection, ongoing pneumothorax, need for further procedure/surgery, chance of hemorrhage, hemoptysis, cardiopulmonary collapse, death. Questions regarding the procedure were encouraged and answered. The patient understands and consents to the procedure. Patient was positioned in the right anterior oblique position on the IR table and scout image of the chest was performed for planning purposes. The right mid axillary line at the level of the nipple was identified, and prepped and draped in the usual sterile fashion. The skin and subcutaneous tissues were generously infiltrated 1% lidocaine for local anesthesia. A trocar needle was then used to enter the pleural space with ultrasound guidance. The inner stylet was removed and an 035 guidewire was advanced into the pleural space under fluoroscopy. Dilation of the skin tract was performed over the wire, and then modified Seldinger technique was used to place a 20 [place] Thal catheter. Sample was aspirated. Catheter was attached to water seal chamber and suction was applied confirming a operational chest tube. Final image was stored. Retention suture was placed.  Sterile dressing was placed. Patient tolerated the procedure well and remained hemodynamically stable throughout. No complications were encountered and no significant blood loss was encounter IMPRESSION: Status post image guided placement of right thoracostomy tube. Signed, Dulcy Fanny. Dellia Nims, RPVI [facility] Electronically Signed   By: Corrie Mckusick D.O.   On: 01/03/2020 16:44      Scheduled Meds: . aspirin EC  81 mg Oral Daily  . clopidogrel  75 mg Oral Daily  .  DULoxetine  60 mg Oral Daily  . enoxaparin (LOVENOX) injection  40 mg Subcutaneous Daily  . feeding supplement  237 mL Oral TID BM  . losartan  50 mg Oral Daily  . multivitamin with minerals  1 tablet Oral Daily  . rosuvastatin  10 mg Oral Daily   Continuous Infusions: . piperacillin-tazobactam (ZOSYN)  IV 3.375 g (01/05/20 0510)     LOS: 3 days      Time spent: 25 minutes   Dessa Phi, DO [facility] 01/05/2020, 12:23 PM   Available via Epic secure  chat 7am-7pm After these hours, please refer to coverage provider listed on [email or web address]

## 2020-01-05 NOTE — Progress Notes (Signed)
Pt had PVCs marked in MAR by telemetry, Darrel, RN. No runs of PVCs.

## 2020-01-06 ENCOUNTER — Inpatient Hospital Stay (HOSPITAL_COMMUNITY): Payer: Medicare HMO

## 2020-01-06 DIAGNOSIS — I509 Heart failure, unspecified: Secondary | ICD-10-CM | POA: Diagnosis not present

## 2020-01-06 DIAGNOSIS — J90 Pleural effusion, not elsewhere classified: Secondary | ICD-10-CM | POA: Diagnosis not present

## 2020-01-06 DIAGNOSIS — R4182 Altered mental status, unspecified: Secondary | ICD-10-CM | POA: Diagnosis not present

## 2020-01-06 DIAGNOSIS — J869 Pyothorax without fistula: Secondary | ICD-10-CM | POA: Diagnosis not present

## 2020-01-06 LAB — CBC
HCT: 31.6 % — ABNORMAL LOW (ref 39.0–52.0)
Hemoglobin: 10.1 g/dL — ABNORMAL LOW (ref 13.0–17.0)
MCH: 26.2 pg (ref 26.0–34.0)
MCHC: 32 g/dL (ref 30.0–36.0)
MCV: 82.1 fL (ref 80.0–100.0)
Platelets: 403 10*3/uL — ABNORMAL HIGH (ref 150–400)
RBC: 3.85 MIL/uL — ABNORMAL LOW (ref 4.22–5.81)
RDW: 13.3 % (ref 11.5–15.5)
WBC: 16.4 10*3/uL — ABNORMAL HIGH (ref 4.0–10.5)
nRBC: 0 % (ref 0.0–0.2)

## 2020-01-06 NOTE — Progress Notes (Signed)
PROGRESS NOTE    [name] [name]  LNL:[id number] DOB: 1940/06/21 DOA: 01/02/2020 PCP: [name], [name], MD     Brief Narrative:  [name] [name] is a 79 y.o. male with medical history significant for CVA in October 2021 with residual dysarthria, essential hypertension, hyperlipidemia, spinal stenosis who initially presented at [facility] from [facility] due to confusion and right-sided pneumonia.  Work-up revealed right-sided loculated pleural effusion with concern for empyema.  On 01/02/2020 patient was seen by general surgery at [facility] with recommendation to transfer to [facility] for CTS evaluation.  He was seen by interventional radiology at [facility], had an attempted ultrasound-guided chest tube placement.  IR was unable to place a chest tube due to the thickness of the pleural fluid.  Imaging consistent with right-sided empyema and evidence of possible pneumonia.  New events last 24 hours / Subjective: No change overnight  Assessment & Plan:   Active Problems:   Empyema of lung (HCC)   Right-sided parapneumonic effusion/empyema -Rocephin --> Zosyn -Cardiothoracic surgery consulted, no surgical recommendation but did recommend pigtail chest tube placement by IR -IR placed right sided chest tube 12/3 -Pleural culture negative growth to date  Acute hypoxemic respiratory failure -Currently on 3 L nasal cannula O2 without distress.  Wean as able  History of CVA in October 2021 with residual dysarthria -Continue aspirin, Plavix, Crestor  Essential hypertension -Continue Cozaar  Anxiety/depression -Continue Cymbalta  Dementia -With mild cognitive impairment without behavioral disturbance  DVT prophylaxis:  enoxaparin (LOVENOX) injection 40 mg Start: 01/03/20 1000  Code Status: Full code Family Communication: None at bedside Disposition Plan:  Status is: Inpatient  Remains inpatient appropriate because:IV treatments  appropriate due to intensity of illness or inability to take PO and Inpatient level of care appropriate due to severity of illness   Dispo:  Patient From: [place]  Planned Disposition: [place]  Expected discharge date: 2-3 days  Medically stable for discharge: No.  Chest tube in place. Continue IV Zosyn     Consultants:   Cardiothoracic surgery  IR  Procedures:   Right chest tube placed by IR 12/3  Antimicrobials:  Anti-infectives (From admission, onward)   Start     Dose/Rate Route Frequency Ordered Stop   01/02/20 2330  piperacillin-tazobactam (ZOSYN) IVPB 3.375 g        3.375 g 12.5 mL/hr over 240 Minutes Intravenous Every 8 hours 01/02/20 2242         Objective: Vitals:   01/05/20 1533 01/05/20 2256 01/06/20 0631 01/06/20 0800  BP: 104/67 118/68 122/78   Pulse: 72 62 62   Resp: 16 18 18    Temp: (!) 97.5 F (36.4 C) (!) 97.3 F (36.3 C) (!) 97.4 F (36.3 C) 97.9 F (36.6 C)  TempSrc: Oral Oral    SpO2: 100% 100%    Weight:      Height:        Intake/Output Summary (Last 24 hours) at 01/06/2020 1030 Last data filed at 01/06/2020 0700 Gross per 24 hour  Intake 160 ml  Output 635 ml  Net -475 ml   Filed Weights   01/03/20 1414  Weight: 66.8 kg    Examination: General exam: Appears calm and comfortable  Respiratory system: Respiratory effort normal. Cardiovascular system: S1 & S2 heard, RRR. No pedal edema. Gastrointestinal system: Abdomen is nondistended, soft and nontender.  Central nervous system: Alert Extremities: Symmetric in appearance bilaterally  Skin: No rashes, lesions or ulcers on exposed  skin    Data Reviewed: I have personally reviewed following labs and imaging studies  CBC: Recent Labs  Lab 01/02/20 0359 01/03/20 0100 01/04/20 0302 01/05/20 0305 01/06/20 0412  WBC 15.0* 15.7* 16.8* 16.3* 16.4*  NEUTROABS 11.6*  --   --   --   --   HGB 11.6* 10.5* 10.6* 12.3* 10.1*  HCT 37.5* 34.1* 33.4*  38.8* 31.6*  MCV 82.4 82.4 81.7 81.7 82.1  PLT 374 390 429* 434* 850*   Basic Metabolic Panel: Recent Labs  Lab 12/31/19 0550 01/01/20 0357 01/02/20 0359 01/03/20 0100  NA 138 136 136 136  K 4.1 4.2 4.1 4.0  CL 100 101 100 99  CO2 27 27 27 25   GLUCOSE 117* 119* 97 105*  BUN 22 19 17 14   CREATININE 1.11 0.87 0.92 1.07  CALCIUM 8.4* 8.3* 8.2* 8.3*  MG  --   --  2.0  --    GFR: Estimated Creatinine Clearance: 53.8 mL/min (by C-G formula based on SCr of 1.07 mg/dL). Liver Function Tests: Recent Labs  Lab 01/03/20 0100  AST 24  ALT 19  ALKPHOS 65  BILITOT 0.8  PROT 6.4*  ALBUMIN 2.0*   No results for input(s): LIPASE, AMYLASE in the last 168 hours. No results for input(s): AMMONIA in the last 168 hours. Coagulation Profile: Recent Labs  Lab 01/01/20 1018  INR 1.1   Cardiac Enzymes: No results for input(s): CKTOTAL, CKMB, CKMBINDEX, TROPONINI in the last 168 hours. BNP (last 3 results) No results for input(s): PROBNP in the last 8760 hours. HbA1C: No results for input(s): HGBA1C in the last 72 hours. CBG: No results for input(s): GLUCAP in the last 168 hours. Lipid Profile: No results for input(s): CHOL, HDL, LDLCALC, TRIG, CHOLHDL, LDLDIRECT in the last 72 hours. Thyroid Function Tests: No results for input(s): TSH, T4TOTAL, FREET4, T3FREE, THYROIDAB in the last 72 hours. Anemia Panel: No results for input(s): VITAMINB12, FOLATE, FERRITIN, TIBC, IRON, RETICCTPCT in the last 72 hours. Sepsis Labs: No results for input(s): PROCALCITON, LATICACIDVEN in the last 168 hours.  Recent Results (from the past 240 hour(s))  MRSA PCR Screening     Status: None   Collection Time: 01/02/20  6:38 AM   Specimen: Nasal Mucosa; Nasopharyngeal  Result Value Ref Range Status   MRSA by PCR NEGATIVE NEGATIVE Final    Comment:        The GeneXpert MRSA Assay (FDA approved for NASAL specimens only), is one component of a comprehensive MRSA colonization surveillance program.  It is not intended to diagnose MRSA infection nor to guide or monitor treatment for MRSA infections. Performed at [facility], [place] [address], [place], [place] [address]   Aerobic/Anaerobic Culture (surgical/deep wound)     Status: None (Preliminary result)   Collection Time: 01/03/20  4:19 PM   Specimen: Abscess  Result Value Ref Range Status   Specimen Description ABSCESS  Final   Special Requests RIGHT PLEURAL  Final   Gram Stain   Final    RARE WBC PRESENT, PREDOMINANTLY PMN NO ORGANISMS SEEN    Culture   Final    NO GROWTH 2 DAYS NO ANAEROBES ISOLATED; CULTURE IN PROGRESS FOR 5 DAYS Performed at [facility], [place] [address] [place], [place] [address]    Report Status PENDING  Incomplete      Radiology Studies: DG CHEST PORT 1 VIEW  Result Date: 01/06/2020 CLINICAL DATA:  Pneumothorax EXAM: PORTABLE CHEST 1 VIEW COMPARISON:  01/04/2020 FINDINGS:  Right chest tube remains in place. Moderate right pleural effusion appears slightly smaller. Right lower lobe airspace disease unchanged. No pneumothorax. Skin fold overlying the right apex. Left lung remains clear without infiltrate or effusion. Negative for heart failure. IMPRESSION: Right chest tube remains in place. Mild improvement in right pleural effusion. No pneumothorax. Electronically Signed   By: Franchot Gallo M.D.   On: 01/06/2020 08:07   DG Chest Port 1 View  Result Date: 01/04/2020 CLINICAL DATA:  Pleural effusion EXAM: PORTABLE CHEST 1 VIEW COMPARISON:  Chest x-ray dated 01/01/2020 and chest CT dated 01/01/2020 FINDINGS: The moderate-sized loculated RIGHT pleural effusion appears stable in size. Interval chest tube placement with tip directed towards the medial aspects of the RIGHT lung base. No pneumothorax is seen. Heart size and mediastinal contours are stable. IMPRESSION: 1. Status post RIGHT-sided chest tube placement. No significant change in the size or configuration of the moderate-sized loculated  RIGHT pleural effusion. 2. No pneumothorax seen. Electronically Signed   By: Franki Cabot M.D.   On: 01/04/2020 11:57      Scheduled Meds: . aspirin EC  81 mg Oral Daily  . clopidogrel  75 mg Oral Daily  . DULoxetine  60 mg Oral Daily  . enoxaparin (LOVENOX) injection  40 mg Subcutaneous Daily  . feeding supplement  237 mL Oral TID BM  . losartan  50 mg Oral Daily  . multivitamin with minerals  1 tablet Oral Daily  . rosuvastatin  10 mg Oral Daily   Continuous Infusions: . piperacillin-tazobactam (ZOSYN)  IV 3.375 g (01/06/20 0510)     LOS: 4 days      Time spent: 25 minutes   Dessa Phi, DO [facility] 01/06/2020, 10:30 AM   Available via Epic secure chat 7am-7pm After these hours, please refer to coverage provider listed on [email or web address]

## 2020-01-06 NOTE — Progress Notes (Addendum)
      [address]       [place] [address]             [phone]           Subjective: Patient sleeping. I briefly awakened him.   Objective: Vital signs in last 24 hours: Temp:  [97.3 F (36.3 C)-97.5 F (36.4 C)] 97.4 F (36.3 C) (12/06 0631) Pulse Rate:  [62-72] 62 (12/06 0631) Cardiac Rhythm: Normal sinus rhythm;Bundle branch block (12/05 1900) Resp:  [16-18] 18 (12/06 0631) BP: (104-122)/(67-78) 122/78 (12/06 0631) SpO2:  [100 %] 100 % (12/05 2256)      Intake/Output from previous day: 12/05 0701 - 12/06 0700 In: 16 [P.O.:60] Out: 615 [Urine:600; Chest Tube:15]   Physical Exam:  Cardiovascular: RRR Pulmonary: Clear to auscultation on left and slightly diminished right basilar breath sound Abdomen: Soft, non tender, bowel sounds present. Extremities: No lower extremity edema. Wounds: Dressing is clean and dry.   Chest Tube: to suction, no air leak  Lab Results: CBC: Recent Labs    01/05/20 0305 01/06/20 0412  WBC 16.3* 16.4*  HGB 12.3* 10.1*  HCT 38.8* 31.6*  PLT 434* 403*   BMET:  No results for input(s): NA, K, CL, CO2, GLUCOSE, BUN, CREATININE, CALCIUM in the last 72 hours.  PT/INR:  No results for input(s): LABPROT, INR in the last 72 hours. ABG:  INR: Will add last result for INR, ABG once components are confirmed Will add last 4 CBG results once components are confirmed  Assessment/Plan:  1. CV - SR. On Losartan 50 mg daily 2.  Pulmonary - S/p placement of 20 French right chest tube. Chest tube with 15 cc last 12 hours. Chest tube is to suction and there is no air leak.  CXR this am appears to show improving aeration right base. If chest tube output remains low, hope to remove chest tube soon. Check CXR in am. Per Dr. Prescott Gum, not a surgical candidate. 3. ID-Leukocytosis. WBC remains 16,400. On Zosyn for empyema right lung.  4. History of CVA-on Plavix and ec asa  Donielle M ZimmermanPA-C 01/06/2020,7:17  AM [phone]  Patient examined and CXR this am reviewed which shows improved aeration of RLL and reduced R effusion. 2F pleural tube in good position and draining cloudy fluid which is No growth after 2 days culture.  Patient not a VATS candidate- cont chest tube to suction,iv Zosyn and push nutrition  P Prescott Gum MD

## 2020-01-06 NOTE — Plan of Care (Signed)
  Problem: Education: Goal: Knowledge of General Education information will improve Description: Including pain rating scale, medication(s)/side effects and non-pharmacologic comfort measures Outcome: Progressing   Problem: Clinical Measurements: Goal: Will remain free from infection Outcome: Progressing Goal: Diagnostic test results will improve Outcome: Progressing Goal: Respiratory complications will improve Outcome: Progressing   

## 2020-01-06 NOTE — Progress Notes (Signed)
Physical Therapy Treatment Patient Details Name: [name] [name] MRN: [id number] DOB: 05-22-1940 Today's Date: 01/06/2020    History of Present Illness Pt is a 79 y.o. male (from SNF) admitted 01/02/20 as transfer from [facility] (admitted there 12/26/19) for CTS evaluation for possible VATS decortication. Workup revealed R-side loculated pleural efffusion with concern for empyema, possible PNA. PMH includes lumbar stenosis, renal disorder.    PT Comments    Pt was in his bed when PT arrived and declined to get OOB to work on sitting balance and transfers.  Follow up with him to increase his strength and control of standing on LRAD.  He is still using chest tube, O2 sats 95% with no SOB observed with any effort.  Pt is making a low effort to do the exercises, but can actively resist the directions as PT is providing.  Follow for goals of acute therapy.   Follow Up Recommendations  SNF     Equipment Recommendations  Rolling walker with 5" wheels;3in1 (PT);Wheelchair (measurements PT);Wheelchair cushion (measurements PT)    Recommendations for Other Services       Precautions / Restrictions Precautions Precautions: Fall Precaution Comments: Watch SpO2; bladder/bowel incontinence Restrictions Weight Bearing Restrictions: No    Mobility  Bed Mobility Overal bed mobility: Needs Assistance Bed Mobility: Rolling (scooting) Rolling: Min assist         General bed mobility comments: scooting max assist  Transfers                 General transfer comment: declines to try  Ambulation/Gait                 Stairs             Wheelchair Mobility    Modified Rankin (Stroke Patients Only)       Balance                                            Cognition Arousal/Alertness: Lethargic Behavior During Therapy: Flat affect Overall Cognitive Status: Impaired/Different from baseline Area of Impairment: Problem  solving;Awareness;Safety/judgement;Following commands;Attention                   Current Attention Level: Selective Memory: Decreased short-term memory Following Commands: Follows one step commands inconsistently;Follows one step commands with increased time Safety/Judgement: Decreased awareness of safety;Decreased awareness of deficits Awareness: Intellectual Problem Solving: Slow processing;Requires verbal cues;Requires tactile cues General Comments: pt is demonstrating low awareness of the tx objectives      Exercises General Exercises - Lower Extremity Ankle Circles/Pumps: AAROM;5 reps Quad Sets: AAROM;10 reps Heel Slides: AAROM;10 reps;20 reps Hip ABduction/ADduction: AAROM;20 reps Straight Leg Raises: 10 reps;AAROM Hip Flexion/Marching: AAROM;10 reps    General Comments General comments (skin integrity, edema, etc.): pt was seen for attempted transfers, refused to try and is stll connected to chest tube      Pertinent Vitals/Pain Pain Assessment: No/denies pain    Home Living                      Prior Function            PT Goals (current goals can now be found in the care plan section) Acute Rehab PT Goals Patient Stated Goal: go home     Frequency    Min 2X/week      PT Plan Current  plan remains appropriate    Co-evaluation              AM-PAC PT "6 Clicks" Mobility   Outcome Measure  Help needed turning from your back to your side while in a flat bed without using bedrails?: A Little Help needed moving from lying on your back to sitting on the side of a flat bed without using bedrails?: A Little Help needed moving to and from a bed to a chair (including a wheelchair)?: A Lot Help needed standing up from a chair using your arms (e.g., wheelchair or bedside chair)?: A Lot Help needed to walk in hospital room?: A Lot Help needed climbing 3-5 steps with a railing? : Total 6 Click Score: 13    End of Session Equipment Utilized  During Treatment: Oxygen Activity Tolerance: Patient tolerated treatment well Patient left: in bed;with call bell/phone within reach;with bed alarm set Nurse Communication: Mobility status PT Visit Diagnosis: Difficulty in walking, not elsewhere classified (R26.2);Muscle weakness (generalized) (M62.81)     Time: 1601-0932 PT Time Calculation (min) (ACUTE ONLY): 19 min  Charges:  $Therapeutic Exercise: 8-22 mins              Ramond Dial 01/06/2020, 8:11 PM  Mee Hives, PT MS Acute Rehab Dept. Number: [place] [facility] [place]

## 2020-01-06 NOTE — Progress Notes (Signed)
Referring Physician(s): Dr. Neita Carp. Prescott Gum  Supervising Physician: Daryll Brod  Patient Status:  [facility] - In-pt  Chief Complaint: Follow up loculated right pleural effusion/empyema s/p right chest tube placement 12/3 in IR  Subjective:  Patient sleeping, arouses briefly and denies complaints but quickly returns to sleep. No family/staff in room during exam.  Allergies: Patient has no known allergies.  Medications: Prior to Admission medications   Medication Sig Start Date End Date Taking? Authorizing Provider  cefTRIAXone 2 g in sodium chloride 0.9 % 100 mL Inject 2 g into the vein daily. 01/02/20  Yes [name], MD  DULoxetine (CYMBALTA) 60 MG capsule Take 1 capsule (60 mg total) by mouth daily. 10/15/19  Yes [name], [name], MD  enoxaparin (LOVENOX) 40 MG/0.4ML injection Inject 0.4 mLs (40 mg total) into the skin daily. 01/02/20  Yes [name], MD  feeding supplement, ENSURE ENLIVE, (ENSURE ENLIVE) LIQD Take 237 mLs by mouth 3 (three) times daily between meals. 11/07/19  Yes [name], MD  furosemide (LASIX) 20 MG tablet 2 tabs daily for 5 days then 1-2 tabs daily as needed for leg swelling. 10/16/19  Yes [name], [name], MD  guaifenesin (ROBITUSSIN) 100 MG/5ML syrup Take 200 mg by mouth 3 (three) times daily as needed for cough.   Yes [provider]  losartan (COZAAR) 50 MG tablet Take 1 tablet (50 mg total) by mouth daily. 10/15/19  Yes [name], [name], MD  nicotine (NICODERM CQ - DOSED IN MG/24 HOURS) 21 mg/24hr patch Place 1 patch (21 mg total) onto the skin daily. 09/23/19  Yes [name], MD  Oxycodone HCl 10 MG TABS Take 1 tablet (10 mg total) by mouth 2 (two) times daily as needed (for moderate - severe pain). 10/16/19  Yes [name], [name], MD  polyethylene glycol (MIRALAX / GLYCOLAX) 17 g packet Take 34 g by mouth 2 (two) times daily. 09/22/19  Yes [name], MD  potassium chloride SA (KLOR-CON) 20 MEQ tablet Take 1 tablet (20 mEq total) by mouth daily as  needed (When taking fluid pill.). 10/16/19  Yes [name], [name], MD  rosuvastatin (CRESTOR) 10 MG tablet Take 1 tablet (10 mg total) by mouth daily. 10/16/19  Yes [name], [name], MD  ARTIFICIAL TEARS 0.2-0.2-1 % SOLN Place 2 drops into both eyes in the morning and at bedtime.  11/15/19   [provider]  aspirin EC 81 MG tablet Take 81 mg by mouth daily. Swallow whole.    [provider]  clopidogrel (PLAVIX) 75 MG tablet Take 1 tablet (75 mg total) by mouth daily. 11/08/19   [name], MD     Vital Signs: BP 101/62 (BP Location: Left Arm)   Pulse 67   Temp 98 F (36.7 C) (Oral)   Resp 18   Ht 5\' 10"  (1.778 m)   Wt 147 lb 4.3 oz (66.8 kg)   SpO2 100%   BMI 21.13 kg/m   Physical Exam Vitals and nursing note reviewed.  Constitutional:      Comments: Sleeping, arouses to verbal cues.  Cardiovascular:     Rate and Rhythm: Normal rate.  Pulmonary:     Effort: Pulmonary effort is normal.     Comments: (+) right chest tube to suction, ~ 300 cc cloudy serous output in pleurvac (+) 3L supplemental O2 via  Skin:    General: Skin is warm and dry.     Imaging: DG CHEST PORT 1 VIEW  Result Date: 01/06/2020 CLINICAL  DATA:  Pneumothorax EXAM: PORTABLE CHEST 1 VIEW COMPARISON:  01/04/2020 FINDINGS: Right chest tube remains in place. Moderate right pleural effusion appears slightly smaller. Right lower lobe airspace disease unchanged. No pneumothorax. Skin fold overlying the right apex. Left lung remains clear without infiltrate or effusion. Negative for heart failure. IMPRESSION: Right chest tube remains in place. Mild improvement in right pleural effusion. No pneumothorax. Electronically Signed   By: Franchot Gallo M.D.   On: 01/06/2020 08:07   DG Chest Port 1 View  Result Date: 01/04/2020 CLINICAL DATA:  Pleural effusion EXAM: PORTABLE CHEST 1 VIEW COMPARISON:  Chest x-ray dated 01/01/2020 and chest CT dated 01/01/2020 FINDINGS: The moderate-sized loculated RIGHT  pleural effusion appears stable in size. Interval chest tube placement with tip directed towards the medial aspects of the RIGHT lung base. No pneumothorax is seen. Heart size and mediastinal contours are stable. IMPRESSION: 1. Status post RIGHT-sided chest tube placement. No significant change in the size or configuration of the moderate-sized loculated RIGHT pleural effusion. 2. No pneumothorax seen. Electronically Signed   By: Franki Cabot M.D.   On: 01/04/2020 11:57   IR PERC PLEURAL DRAIN W/INDWELL CATH W/IMG GUIDE  Result Date: 01/03/2020 INDICATION: 79 year old male with a history of empyema EXAM: IMAGE GUIDED PLACEMENT OF RIGHT THORACOSTOMY TUBE MEDICATIONS: The patient is currently admitted to the hospital and receiving intravenous antibiotics. The antibiotics were administered within an appropriate time frame prior to the initiation of the procedure. ANESTHESIA/SEDATION: Fentanyl 25 mcg IV; Versed 51.0 mg IV Moderate Sedation Time:  15 minutes The patient was continuously monitored during the procedure by the interventional radiology nurse under my direct supervision. COMPLICATIONS: None PROCEDURE: The procedure, risks, benefits, and alternatives were explained to the patient/patient's family, who provided informed consent on the patient's behalf. Specific risks that were addressed included bleeding, infection, ongoing pneumothorax, need for further procedure/surgery, chance of hemorrhage, hemoptysis, cardiopulmonary collapse, death. Questions regarding the procedure were encouraged and answered. The patient understands and consents to the procedure. Patient was positioned in the right anterior oblique position on the IR table and scout image of the chest was performed for planning purposes. The right mid axillary line at the level of the nipple was identified, and prepped and draped in the usual sterile fashion. The skin and subcutaneous tissues were generously infiltrated 1% lidocaine for local  anesthesia. A trocar needle was then used to enter the pleural space with ultrasound guidance. The inner stylet was removed and an 035 guidewire was advanced into the pleural space under fluoroscopy. Dilation of the skin tract was performed over the wire, and then modified Seldinger technique was used to place a 20 [place] Thal catheter. Sample was aspirated. Catheter was attached to water seal chamber and suction was applied confirming a operational chest tube. Final image was stored. Retention suture was placed.  Sterile dressing was placed. Patient tolerated the procedure well and remained hemodynamically stable throughout. No complications were encountered and no significant blood loss was encounter IMPRESSION: Status post image guided placement of right thoracostomy tube. Signed, Dulcy Fanny. Dellia Nims, RPVI [facility] Electronically Signed   By: Corrie Mckusick D.O.   On: 01/03/2020 16:44    Labs:  CBC: Recent Labs    01/03/20 0100 01/04/20 0302 01/05/20 0305 01/06/20 0412  WBC 15.7* 16.8* 16.3* 16.4*  HGB 10.5* 10.6* 12.3* 10.1*  HCT 34.1* 33.4* 38.8* 31.6*  PLT 390 429* 434* 403*    COAGS: Recent Labs  01/01/20 1018  INR 1.1  APTT 38*    BMP: Recent Labs    09/21/19 0759 09/21/19 0759 09/22/19 0739 09/22/19 0739 11/01/19 2257 11/07/19 0413 11/12/19 1051 12/26/19 1510 12/31/19 0550 01/01/20 0357 01/02/20 0359 01/03/20 0100  NA 139   < > 137   < > 138   < > 145   < > 138 136 136 136  K 3.9   < > 3.9   < > 4.7   < > 4.7   < > 4.1 4.2 4.1 4.0  CL 108   < > 105   < > 102   < > 106   < > 100 101 100 99  CO2 20*   < > 23   < > 28   < > 20   < > 27 27 27 25   GLUCOSE 90   < > 88   < > 89   < > 83   < > 117* 119* 97 105*  BUN 21   < > 25*   < > 16   < > 34*   < > 22 19 17 14   CALCIUM 8.2*   < > 8.5*   < > 9.2   < > 9.3   < > 8.4* 8.3* 8.2* 8.3*  CREATININE 1.07   < > 1.07   < > 1.29*   < > 1.20*   < > 1.11 0.87 0.92  1.07  GFRNONAA >60   < > >60   < > 53*   < > 58*   < > >60 >60 >60 >60  GFRAA >60  --  >60  --  >60  --  67  --   --   --   --   --    < > = values in this interval not displayed.    LIVER FUNCTION TESTS: Recent Labs    09/19/19 0021 11/01/19 2257 12/26/19 1510 01/03/20 0100  BILITOT 0.8 1.0 0.9 0.8  AST 20 26 23 24   ALT 12 19 18 19   ALKPHOS 97 107 85 65  PROT 7.0 7.9 8.0 6.4*  ALBUMIN 3.5 4.2 3.2* 2.0*    Assessment and Plan:  79 y/o M s/p right chest tube placement 12/3 in IR for loculated pleural effusion/empyema seen today for follow up chest tube placement.  Patient sleeping on exam today, voices no complaints when woken up. Chest tube to suction without obvious air leak, ~300 cc cloudy serous output in pleurvac. Culture from aspirate on 12/3 shows no NGTD. CXR this AM shows improved aeration of RLL and reduced effusion.   CT surgery following with plans to continue chest tube to suction for now, further plans per their service. IR remains available as needed - please call with questions or concerns.  Electronically Signed: Joaquim Nam, PA-C 01/06/2020, 3:24 PM   I spent a total of 15 Minutes at the the patient's bedside AND on the patient's hospital floor or unit, greater than 50% of which was counseling/coordinating care for right chest tube follow up.

## 2020-01-07 ENCOUNTER — Inpatient Hospital Stay (HOSPITAL_COMMUNITY): Payer: Medicare HMO

## 2020-01-07 DIAGNOSIS — Z4682 Encounter for fitting and adjustment of non-vascular catheter: Secondary | ICD-10-CM | POA: Diagnosis not present

## 2020-01-07 DIAGNOSIS — R4182 Altered mental status, unspecified: Secondary | ICD-10-CM | POA: Diagnosis not present

## 2020-01-07 DIAGNOSIS — J869 Pyothorax without fistula: Secondary | ICD-10-CM | POA: Diagnosis not present

## 2020-01-07 LAB — CBC
HCT: 33.6 % — ABNORMAL LOW (ref 39.0–52.0)
Hemoglobin: 10.1 g/dL — ABNORMAL LOW (ref 13.0–17.0)
MCH: 25.2 pg — ABNORMAL LOW (ref 26.0–34.0)
MCHC: 30.1 g/dL (ref 30.0–36.0)
MCV: 83.8 fL (ref 80.0–100.0)
Platelets: 396 10*3/uL (ref 150–400)
RBC: 4.01 MIL/uL — ABNORMAL LOW (ref 4.22–5.81)
RDW: 13.2 % (ref 11.5–15.5)
WBC: 12.5 10*3/uL — ABNORMAL HIGH (ref 4.0–10.5)
nRBC: 0 % (ref 0.0–0.2)

## 2020-01-07 NOTE — Progress Notes (Signed)
PROGRESS NOTE    Artice Bergerson  WIO:[id number] DOB: [birth year] DOA: 01/02/2020 PCP: [name], [name], MD     Brief Narrative:  Luman Holway is a 79 y.o. 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  Work-up revealed right-sided loculated pleural effusion with concern for empyema.  On 01/02/2020 patient was seen by general surgery at [facility] with recommendation to transfer to [facility] for CTS evaluation.  He was seen by interventional radiology at [facility], had an attempted ultrasound-guided chest tube placement.  IR was unable to place a chest tube due to the thickness of the pleural fluid.  Imaging consistent with right-sided empyema and evidence of possible pneumonia.  Patient was evaluated by CTS.  They recommended for IR to place pigtail chest tube which was completed 12/3.  New events last 24 hours / Subjective: Patient without any new complaints today  Assessment & Plan:   Active Problems:   Empyema of lung (HCC)   Right-sided parapneumonic effusion/empyema -Rocephin --> Zosyn -Cardiothoracic surgery consulted, no surgical recommendation but did recommend pigtail chest tube placement by IR -IR placed right sided chest tube 12/3 -Pleural culture negative growth to date -WBC trending down  Acute hypoxemic respiratory failure -Currently on 3 L nasal cannula O2 without distress.  Wean as able  History of CVA in October 2021 with residual dysarthria -Continue aspirin, Plavix, Crestor  Essential hypertension -Continue Cozaar  Anxiety/depression -Continue Cymbalta  Dementia -With mild cognitive impairment without behavioral disturbance  DVT prophylaxis:  enoxaparin (LOVENOX) injection 40 mg Start: 01/03/20 1000  Code  Status: Full code Family Communication: None at bedside Disposition Plan:  Status is: Inpatient  Remains inpatient appropriate because:IV treatments appropriate due to intensity of illness or inability to take PO and Inpatient level of care appropriate due to severity of illness   Dispo:  Patient From: [place]  Planned Disposition: [facility]  Expected discharge date: 2-3 days  Medically stable for discharge: No.  Chest tube in place. Continue IV Zosyn     Consultants:   Cardiothoracic surgery  IR  Procedures:   Right chest tube placed by IR 12/3  Antimicrobials:  Anti-infectives (From admission, onward)   Start     Dose/Rate Route Frequency Ordered Stop   01/02/20 2330  piperacillin-tazobactam (ZOSYN) IVPB 3.375 g        3.375 g 12.5 mL/hr over 240 Minutes Intravenous Every 8 hours 01/02/20 2242         Objective: Vitals:   01/06/20 0800 01/06/20 1512 01/06/20 2059 01/07/20 0523  BP:  101/62 103/60 137/81  Pulse:  67 77 66  Resp:  18 18 16   Temp: 97.9 F (36.6 C) 98 F (36.7 C) (!) 97.5 F (36.4 C) (!) 97.5 F (36.4 C)  TempSrc:  Oral Oral Oral  SpO2:  100% 100% 100%  Weight:      Height:        Intake/Output Summary (Last 24 hours) at 01/07/2020 1314 Last data filed at 01/07/2020 0900 Gross per 24 hour  Intake 1165.15 ml  Output 1531 ml  Net -365.85 ml   Filed Weights   01/03/20 1414  Weight: 66.8 kg    Examination: General exam: Appears calm and comfortable  Respiratory system: Respiratory effort normal. Cardiovascular system: S1 & S2 heard, RRR. No pedal edema. Gastrointestinal system:  Abdomen is nondistended, soft and nontender. Normal bowel sounds heard. Central nervous system: Alert  Extremities: Symmetric in appearance bilaterally  Skin: No rashes, lesions or ulcers on exposed skin  Psychiatry: Flat affect   Data Reviewed: I have personally reviewed following labs and imaging studies  CBC: Recent Labs   Lab 01/02/20 0359 01/02/20 0359 01/03/20 0100 01/04/20 0302 01/05/20 0305 01/06/20 0412 01/07/20 0429  WBC 15.0*   < > 15.7* 16.8* 16.3* 16.4* 12.5*  NEUTROABS 11.6*  --   --   --   --   --   --   HGB 11.6*   < > 10.5* 10.6* 12.3* 10.1* 10.1*  HCT 37.5*   < > 34.1* 33.4* 38.8* 31.6* 33.6*  MCV 82.4   < > 82.4 81.7 81.7 82.1 83.8  PLT 374   < > 390 429* 434* 403* 396   < > = values in this interval not displayed.   Basic Metabolic Panel: Recent Labs  Lab 01/01/20 0357 01/02/20 0359 01/03/20 0100  NA 136 136 136  K 4.2 4.1 4.0  CL 101 100 99  CO2 27 27 25   GLUCOSE 119* 97 105*  BUN 19 17 14   CREATININE 0.87 0.92 1.07  CALCIUM 8.3* 8.2* 8.3*  MG  --  2.0  --    GFR: Estimated Creatinine Clearance: 53.8 mL/min (by C-G formula based on SCr of 1.07 mg/dL). Liver Function Tests: Recent Labs  Lab 01/03/20 0100  AST 24  ALT 19  ALKPHOS 65  BILITOT 0.8  PROT 6.4*  ALBUMIN 2.0*   No results for input(s): LIPASE, AMYLASE in the last 168 hours. No results for input(s): AMMONIA in the last 168 hours. Coagulation Profile: Recent Labs  Lab 01/01/20 1018  INR 1.1   Cardiac Enzymes: No results for input(s): CKTOTAL, CKMB, CKMBINDEX, TROPONINI in the last 168 hours. BNP (last 3 results) No results for input(s): PROBNP in the last 8760 hours. HbA1C: No results for input(s): HGBA1C in the last 72 hours. CBG: No results for input(s): GLUCAP in the last 168 hours. Lipid Profile: No results for input(s): CHOL, HDL, LDLCALC, TRIG, CHOLHDL, LDLDIRECT in the last 72 hours. Thyroid Function Tests: No results for input(s): TSH, T4TOTAL, FREET4, T3FREE, THYROIDAB in the last 72 hours. Anemia Panel: No results for input(s): VITAMINB12, FOLATE, FERRITIN, TIBC, IRON, RETICCTPCT in the last 72 hours. Sepsis Labs: No results for input(s): PROCALCITON, LATICACIDVEN in the last 168 hours.  Recent Results (from the past 240 hour(s))  MRSA PCR Screening     Status: None    Collection Time: 01/02/20  6:38 AM   Specimen: Nasal Mucosa; Nasopharyngeal  Result Value Ref Range Status   MRSA by PCR NEGATIVE NEGATIVE Final    Comment:        The GeneXpert MRSA Assay (FDA approved for NASAL specimens only), is one component of a comprehensive MRSA colonization surveillance program. It is not intended to diagnose MRSA infection nor to guide or monitor treatment for MRSA infections. Performed at [facility], [place] [address], [place], [place] [address]   Aerobic/Anaerobic Culture (surgical/deep wound)     Status: None (Preliminary result)   Collection Time: 01/03/20  4:19 PM   Specimen: Abscess  Result Value Ref Range Status   Specimen Description ABSCESS  Final   Special Requests RIGHT PLEURAL  Final   Gram Stain   Final    RARE WBC PRESENT, PREDOMINANTLY PMN NO ORGANISMS SEEN    Culture   Final  NO GROWTH 3 DAYS NO ANAEROBES ISOLATED; CULTURE IN PROGRESS FOR 5 DAYS Performed at [facility], [place] [address], [place], [place] [address]    Report Status PENDING  Incomplete      Radiology Studies: DG Chest Port 1 View  Result Date: 01/07/2020 CLINICAL DATA:  Placement of chest tube EXAM: PORTABLE CHEST 1 VIEW COMPARISON:  January 01, 2020 CT of the chest and January 06, 2020 chest x-ray FINDINGS: Tracheal air column is midline. Cardiomediastinal contours are stable, partially obscured in the RIGHT chest due to loculated pleural fluid. RIGHT-sided thoracostomy tube in stable position. Tip coiled medially. Side ports in the RIGHT chest cavity. No change in the appearance of loculated pleural fluid in the RIGHT chest. No visible pneumothorax. Signs of healing fractures of the LEFT chest without change. Along the RIGHT chest wall there is kinking of the thoracostomy tube. IMPRESSION: 1. Stable appearance of loculated pleural fluid on the RIGHT. 2. Chest tube remains in place with some kinking along the RIGHT chest wall, correlate with chest tube  function. No visible pneumothorax. These results will be called to the ordering clinician or representative by the Radiologist Assistant, and communication documented in the PACS or [employer]. Electronically Signed   By: Zetta Bills M.D.   On: 01/07/2020 07:51   DG CHEST PORT 1 VIEW  Result Date: 01/06/2020 CLINICAL DATA:  Pneumothorax EXAM: PORTABLE CHEST 1 VIEW COMPARISON:  01/04/2020 FINDINGS: Right chest tube remains in place. Moderate right pleural effusion appears slightly smaller. Right lower lobe airspace disease unchanged. No pneumothorax. Skin fold overlying the right apex. Left lung remains clear without infiltrate or effusion. Negative for heart failure. IMPRESSION: Right chest tube remains in place. Mild improvement in right pleural effusion. No pneumothorax. Electronically Signed   By: Franchot Gallo M.D.   On: 01/06/2020 08:07      Scheduled Meds: . aspirin EC  81 mg Oral Daily  . clopidogrel  75 mg Oral Daily  . DULoxetine  60 mg Oral Daily  . enoxaparin (LOVENOX) injection  40 mg Subcutaneous Daily  . feeding supplement  237 mL Oral TID BM  . losartan  50 mg Oral Daily  . multivitamin with minerals  1 tablet Oral Daily  . rosuvastatin  10 mg Oral Daily   Continuous Infusions: . piperacillin-tazobactam (ZOSYN)  IV 3.375 g (01/07/20 1117)     LOS: 5 days      Time spent: 25 minutes   Dessa Phi, DO [facility] 01/07/2020, 1:14 PM   Available via Epic secure chat 7am-7pm After these hours, please refer to coverage provider listed on [email or web address]

## 2020-01-07 NOTE — Progress Notes (Addendum)
      [address]       [place] [address]             [phone]           Subjective: Sleeping but awakened easily. Mental status seems appropriate. Denies pain or any difficulties with breathing.   Objective: Vital signs in last 24 hours: Temp:  [97.5 F (36.4 C)-98 F (36.7 C)] 97.5 F (36.4 C) (12/07 0523) Pulse Rate:  [66-77] 66 (12/07 0523) Cardiac Rhythm: Normal sinus rhythm;Bundle branch block (12/06 2022) Resp:  [16-18] 16 (12/07 0523) BP: (101-137)/(60-81) 137/81 (12/07 0523) SpO2:  [100 %] 100 % (12/07 0523)   Intake/Output from previous day: 12/06 0701 - 12/07 0700 In: 1282.2 [P.O.:1221; IV Piggyback:61.2] Out: 1030 [Urine:950; Chest Tube:80]   Physical Exam:  Cardiovascular: RRR Pulmonary: Clear to auscultation on left and slightly diminished right basilar breath sound.  The tight CT drained 55ml/day for past 2 days. No air leak. The CT remains on suction.  Chest Tube: well secured, site is clean and dry.   Lab Results: CBC: Recent Labs    01/06/20 0412 01/07/20 0429  WBC 16.4* 12.5*  HGB 10.1* 10.1*  HCT 31.6* 33.6*  PLT 403* 396   BMET:  No results for input(s): NA, K, CL, CO2, GLUCOSE, BUN, CREATININE, CALCIUM in the last 72 hours.  PT/INR:  No results for input(s): LABPROT, INR in the last 72 hours. ABG:  INR: Will add last result for INR, ABG once components are confirmed Will add last 4 CBG results once components are confirmed  EXAM: PORTABLE CHEST 1 VIEW  COMPARISON:  January 01, 2020 CT of the chest and January 06, 2020 chest x-ray  FINDINGS: Tracheal air column is midline. Cardiomediastinal contours are stable, partially obscured in the RIGHT chest due to loculated pleural fluid.  RIGHT-sided thoracostomy tube in stable position. Tip coiled medially. Side ports in the RIGHT chest cavity. No change in the appearance of loculated pleural fluid in the RIGHT chest. No visible pneumothorax.  Signs of  healing fractures of the LEFT chest without change.  Along the RIGHT chest wall there is kinking of the thoracostomy tube.  IMPRESSION: 1. Stable appearance of loculated pleural fluid on the RIGHT. 2. Chest tube remains in place with some kinking along the RIGHT chest wall, correlate with chest tube function. No visible pneumothorax.  These results will be called to the ordering clinician or representative by the Radiologist Assistant, and communication documented in the PACS or [employer].   Electronically Signed   By: Zetta Bills M.D.   On: 01/07/2020 07:51  Assessment/Plan:  -POD-4 placement of a right 80fr pleural tube by IR for para-pneumonic effusion / empyema. Pleural fluid cx's remain negative at 3 days. Minimal drainage consisting of clear,serous fluid. WBC continues to trend down, he is on Zosyn. CXR is stable with CT in good position for drainage.  CT management per Dr. Darcey Nora.   Joline Maxcy 01/07/2020,8:38 AM [phone]    Patient afebrile with WBC decreased to 12.5k   Total chest tube output 350 cc in pleurovac   Patient remains bed bound and withdrawn   Not a candidate for VATS   Cont chest tube drainage until output scant   patient examined and medical record reviewed,agree with above note. Tharon Aquas Trigt III 01/07/2020

## 2020-01-07 NOTE — Progress Notes (Signed)
Nutrition Follow-up  DOCUMENTATION CODES:   Severe malnutrition in context of chronic illness  INTERVENTION:   -Continue Ensure Enlive po TID, each supplement provides 350 kcal and 20 grams of protein -Magic cup TID with meals, each supplement provides 290 kcal and 9 grams of protein -Continue MVI with minerals daily  NUTRITION DIAGNOSIS:   Severe Malnutrition related to chronic illness (CVA) as evidenced by moderate fat depletion, severe fat depletion, moderate muscle depletion, severe muscle depletion.  Ongoing  GOAL:   Patient will meet greater than or equal to 90% of their needs  Progressing   MONITOR:   PO intake, Supplement acceptance, Labs, Weight trends, Skin, I & O's  REASON FOR ASSESSMENT:   Malnutrition Screening Tool    ASSESSMENT:   [name] [name] is a 79 y.o. male with medical history significant for CVA in October 2021 with residual dysarthria, essential hypertension, hyperlipidemia, spinal stenosis who initially presented at [facility] from [facility] SNF due to confusion and right-sided pneumonia.  12/3- s/p Placement of a right chest tube.  59F Thal.  To pleural evac system.   Reviewed I/O's: +252 ml x 24 hours and -76 ml since admission  UOP: 950 ml x 24 hours  Chest tube output: 80 ml x 24 hours  Per CVTS notes, plan to continue chest tube. Pt is not a surgical candidate.   Spoke with pt at bedside, who reports feeling better after chest tube was placed. Observed breakfast tray- pt consumed about 50% of scrambled eggs and oatmeal. Documented meal completion 25-95%. He consumed about 1/3 of Ensure supplementl per pt, he likes them. Pt reports great appetite with no difficulty chewing or swallowing. Pt reports good appetite PTA.   Discussed importance of good meal and supplement intake to promote healing.   Labs reviewed.   NUTRITION - FOCUSED PHYSICAL EXAM:    Most Recent Value  Orbital Region Moderate depletion   Upper Arm Region Severe depletion  Thoracic and Lumbar Region Moderate depletion  Buccal Region Severe depletion  Temple Region Severe depletion  Clavicle Bone Region Severe depletion  Clavicle and Acromion Bone Region Severe depletion  Scapular Bone Region Severe depletion  Dorsal Hand Severe depletion  Patellar Region Moderate depletion  Anterior Thigh Region Moderate depletion  Posterior Calf Region Moderate depletion  Edema (RD Assessment) None  Hair Reviewed  Eyes Reviewed  Mouth Reviewed  Skin Reviewed  Nails Reviewed       Diet Order:   Diet Order            Diet regular Room service appropriate? Yes; Fluid consistency: Thin  Diet effective now                 EDUCATION NEEDS:   Education needs have been addressed  Skin:  Skin Assessment: Skin Integrity Issues: Skin Integrity Issues:: Other (Comment) Other: s/p chest tube placement  Last BM:  01/05/20  Height:   Ht Readings from Last 1 Encounters:  01/03/20 5\' 10"  (1.778 m)    Weight:   Wt Readings from Last 1 Encounters:  01/03/20 66.8 kg    Ideal Body Weight:  74.5 kg  BMI:  Body mass index is 21.13 kg/m.  Estimated Nutritional Needs:   Kcal:  2150-2350  Protein:  120-135 grams  Fluid:  > 2 L    Loistine Chance, RD, LDN, [place] Registered Dietitian II Certified Diabetes Care and Education Specialist Please refer to [facility] for RD and/or RD on-call/weekend/after hours pager

## 2020-01-08 ENCOUNTER — Inpatient Hospital Stay (HOSPITAL_COMMUNITY): Payer: Medicare HMO

## 2020-01-08 DIAGNOSIS — R471 Dysarthria and anarthria: Secondary | ICD-10-CM | POA: Diagnosis not present

## 2020-01-08 DIAGNOSIS — Z8673 Personal history of transient ischemic attack (TIA), and cerebral infarction without residual deficits: Secondary | ICD-10-CM

## 2020-01-08 DIAGNOSIS — F0390 Unspecified dementia without behavioral disturbance: Secondary | ICD-10-CM

## 2020-01-08 DIAGNOSIS — I10 Essential (primary) hypertension: Secondary | ICD-10-CM | POA: Diagnosis not present

## 2020-01-08 DIAGNOSIS — R5381 Other malaise: Secondary | ICD-10-CM | POA: Diagnosis not present

## 2020-01-08 DIAGNOSIS — E43 Unspecified severe protein-calorie malnutrition: Secondary | ICD-10-CM | POA: Diagnosis not present

## 2020-01-08 DIAGNOSIS — N289 Disorder of kidney and ureter, unspecified: Secondary | ICD-10-CM | POA: Diagnosis not present

## 2020-01-08 DIAGNOSIS — J918 Pleural effusion in other conditions classified elsewhere: Secondary | ICD-10-CM

## 2020-01-08 DIAGNOSIS — J869 Pyothorax without fistula: Secondary | ICD-10-CM | POA: Diagnosis not present

## 2020-01-08 DIAGNOSIS — J90 Pleural effusion, not elsewhere classified: Secondary | ICD-10-CM | POA: Diagnosis not present

## 2020-01-08 DIAGNOSIS — J189 Pneumonia, unspecified organism: Secondary | ICD-10-CM

## 2020-01-08 DIAGNOSIS — J9811 Atelectasis: Secondary | ICD-10-CM | POA: Diagnosis not present

## 2020-01-08 LAB — CBC
HCT: 33.1 % — ABNORMAL LOW (ref 39.0–52.0)
Hemoglobin: 9.9 g/dL — ABNORMAL LOW (ref 13.0–17.0)
MCH: 25 pg — ABNORMAL LOW (ref 26.0–34.0)
MCHC: 29.9 g/dL — ABNORMAL LOW (ref 30.0–36.0)
MCV: 83.6 fL (ref 80.0–100.0)
Platelets: 417 10*3/uL — ABNORMAL HIGH (ref 150–400)
RBC: 3.96 MIL/uL — ABNORMAL LOW (ref 4.22–5.81)
RDW: 13.1 % (ref 11.5–15.5)
WBC: 13 10*3/uL — ABNORMAL HIGH (ref 4.0–10.5)
nRBC: 0 % (ref 0.0–0.2)

## 2020-01-08 LAB — AEROBIC/ANAEROBIC CULTURE W GRAM STAIN (SURGICAL/DEEP WOUND): Culture: NO GROWTH

## 2020-01-08 LAB — BASIC METABOLIC PANEL
Anion gap: 7 (ref 5–15)
BUN: 19 mg/dL (ref 8–23)
CO2: 28 mmol/L (ref 22–32)
Calcium: 8.3 mg/dL — ABNORMAL LOW (ref 8.9–10.3)
Chloride: 100 mmol/L (ref 98–111)
Creatinine, Ser: 1.21 mg/dL (ref 0.61–1.24)
GFR, Estimated: >60 mL/min (ref >60)
Glucose, Bld: 102 mg/dL — ABNORMAL HIGH (ref 70–99)
Potassium: 4.6 mmol/L (ref 3.5–5.1)
Sodium: 135 mmol/L (ref 135–145)

## 2020-01-08 NOTE — Consult Note (Signed)
   [facility] Inpatient Consult   01/08/2020  Dimitrius Steedman 18-Dec-1940 [id number]  [facility] [ACO] Patient:  [employer]   Patient screened for high risk score for unplanned readmission score and for hospitalizations to check if potential [facility] [facility] service needs.  Review of patient's medical record reveals patient is for a skilled nursing facility likely long term care, potential Medicaid application was applied for per inpatient TOC LCSW notes on 01/03/20. Review of [facility] RN Care Management notes patient Medicaid was applied for on 12/06/19 note. Met with the patient at the bedside.  Patient is alert, soft spoke, states his sister is his contact person who helps him with making decisions.  States he feels better today and ate as much of his lunch as he wanted. Alert and oriented to self and place.  Primary Care Provider is [name], [name], MD  this provider is listed to provide the transition of care [TOC] for post hospital follow up.   Plan:  Following  up with inpatient [facility] team progress and disposition notes.  Continue to follow progress and disposition to assess for post hospital care management needs.  Patient is currently for a SNF level of care.  For questions contact:   Natividad Brood, RN BSN [facility] Liaison  [phone] business mobile phone Toll free office [phone]  Fax number: [phone] [email or web address]

## 2020-01-08 NOTE — Progress Notes (Signed)
      [address]       [place] [address]             [phone]           Subjective: Sleeping but awakened easily. Not as talkative today. Said he is breathing OK.  O2 at 3Lnc, sat 100%.  Objective: Vital signs in last 24 hours: Temp:  [98 F (36.7 C)-98.8 F (37.1 C)] 98.2 F (36.8 C) (12/08 0806) Pulse Rate:  [61-68] 68 (12/08 0806) Cardiac Rhythm: Sinus bradycardia;Bundle branch block (12/08 0700) Resp:  [16-18] 17 (12/08 0806) BP: (129-144)/(68-74) 138/70 (12/08 0806) SpO2:  [96 %-100 %] 100 % (12/08 0806)   Intake/Output from previous day: 12/07 0701 - 12/08 0700 In: 509.5 [P.O.:380; IV Piggyback:129.5] Out: 2061 [Urine:1950; Stool:1; Chest Tube:110]   Physical Exam:  Cardiovascular: RRR Pulmonary: Clear to auscultation on left and slightly diminished right base. The right CT drained 163ml/day for past 24hrs. No air leak. The CT remains on suction.  Chest Tube: well secured, site is clean and dry.   Lab Results: CBC: Recent Labs    01/07/20 0429 01/08/20 0016  WBC 12.5* 13.0*  HGB 10.1* 9.9*  HCT 33.6* 33.1*  PLT 396 417*   BMET:  Recent Labs    01/08/20 0016  NA 135  K 4.6  CL 100  CO2 28  GLUCOSE 102*  BUN 19  CREATININE 1.21  CALCIUM 8.3*    PT/INR:  No results for input(s): LABPROT, INR in the last 72 hours. ABG:  INR: Will add last result for INR, ABG once components are confirmed Will add last 4 CBG results once components are confirmed  EXAM: PORTABLE CHEST 1 VIEW  COMPARISON:  01/07/2020  FINDINGS: Right chest tube is in place. Continued moderate right pleural effusion. No pneumothorax. Right lower lobe atelectasis or infiltrate. No confluent opacity on the left. Heart is normal size.  IMPRESSION: Stable position of the right chest tube without pneumothorax. Stable moderate right pleural effusion.   Electronically Signed   By: Rolm Baptise M.D.   On: 01/08/2020  07:17  Assessment/Plan:  -POD-5 placement of a right 32fr pleural tube by IR for para-pneumonic effusion / empyema. Pleural fluid cx's had no growth at 4 days. Moderate drainage consisting of clear,serous fluid. Has mild, stable leukocytosis,  he is on Zosyn. CXR is stable with CT in good position low in the right hemithorax.  CT management per Dr. Darcey Nora.  We will continue to follow.   Arlis Porta RoddenberryPA-C 01/08/2020,9:55 AM [phone]

## 2020-01-08 NOTE — Care Management Important Message (Signed)
Important Message  Patient Details  Name: [name] [name] MRN: [id number] Date of Birth: [birth year]   Medicare Important Message Given:  Yes     Orbie Pyo 01/08/2020, 3:45 PM

## 2020-01-08 NOTE — Evaluation (Signed)
Occupational Therapy Evaluation Patient Details Name: [name] [name] MRN: [id number] DOB: 05/20/1940 Today's Date: 01/08/2020    History of Present Illness Pt is a 79 y.o. male (from SNF) admitted 01/02/20 as transfer from [facility] (admitted there 12/26/19) for CTS evaluation for possible VATS decortication. Workup revealed R-side loculated pleural efffusion with concern for empyema, possible PNA. PMH includes lumbar stenosis, renal disorder.   Clinical Impression   Pt was walking in therapy at SNF, self fed and completed grooming, but was otherwise dependent in ADL. Sister bedside during evaluation and voicing concerns about pt returning home. Pt with poor awareness of deficits and safety. Pt currently requires up to mod assist for mobility and min to total assist for ADL. He will need further rehab upon discharge in SNF. Sister is hopeful for long term placement upon completion of rehab as he does not have adequate family support at home and is unable to care for all of his needs. Will follow acutely.    Follow Up Recommendations  SNF    Equipment Recommendations       Recommendations for Other Services       Precautions / Restrictions Precautions Precautions: Fall Precaution Comments:  chest tube, Watch SpO2; bladder/bowel incontinence      Mobility Bed Mobility Overal bed mobility: Needs Assistance Bed Mobility: Rolling;Sidelying to Sit;Sit to Sidelying Rolling: Min assist Sidelying to sit: Mod assist     Sit to sidelying: Mod assist General bed mobility comments: assist to raise trunk and for LEs back into bed    Transfers Overall transfer level: Needs assistance Equipment used: Rolling walker (2 wheeled) Transfers: Sit to/from [facility] Sit to Stand: Mod assist;From elevated surface Stand pivot transfers: Min assist       General transfer comment: assist to rise and steady, cues for hand placement    Balance Overall balance assessment:  Needs assistance   Sitting balance-Leahy Scale: Fair     Standing balance support: Bilateral upper extremity supported Standing balance-Leahy Scale: Poor                             ADL either performed or assessed with clinical judgement   ADL Overall ADL's : Needs assistance/impaired Eating/Feeding: Minimal assistance;Sitting   Grooming: Wash/dry hands;Wash/dry face;Supervision/safety;Sitting (max assist to shave)   Upper Body Bathing: Moderate assistance;Sitting   Lower Body Bathing: Total assistance;Sit to/from stand   Upper Body Dressing : Minimal assistance;Sitting   Lower Body Dressing: Total assistance;Sit to/from stand   Toilet Transfer: Minimal assistance;Stand-pivot;RW   Toileting- Clothing Manipulation and Hygiene: Total assistance;Sit to/from stand         General ADL Comments: long conversation with sister at bedside about her concerns with pt discharging home, states he has needed 24 hour assistance for some time     Vision Patient Visual Report: No change from baseline       Perception     Praxis      Pertinent Vitals/Pain Pain Assessment: No/denies pain     Hand Dominance Right   Extremity/Trunk Assessment Upper Extremity Assessment Upper Extremity Assessment: Generalized weakness   Lower Extremity Assessment Lower Extremity Assessment: Defer to PT evaluation   Cervical / Trunk Assessment Cervical / Trunk Assessment: Kyphotic;Other exceptions (weakness)   Communication Communication Communication: HOH;Other (comment) (low volume)   Cognition Arousal/Alertness: Awake/alert Behavior During Therapy: Flat affect Overall Cognitive Status: History of cognitive impairments - at baseline  General Comments: pt with baseline dementia per sister   General Comments       Exercises     Shoulder Instructions      Home Living Family/patient expects to be discharged to:: Skilled  nursing facility                                 Additional Comments: pt is coming from SNF, wants to go home, but sister wants him to have long term placement after rehab      Prior Functioning/Environment Level of Independence: Needs assistance  Gait / Transfers Assistance Needed: Pt reports working with PT on ambulation with walker ADL's / Homemaking Assistance Needed: pt could self feed and groom, he was otherwise assisted for ADL   Comments: per sister, pt was having difficulty managing medications at home, there is a son in the home, but he is not a caregiver        OT Problem List: Decreased strength;Decreased activity tolerance;Impaired balance (sitting and/or standing);Decreased cognition;Decreased safety awareness;Decreased knowledge of use of DME or AE      OT Treatment/Interventions: Self-care/ADL training;DME and/or AE instruction;Therapeutic activities;Balance training;Patient/family education    OT Goals(Current goals can be found in the care plan section) Acute Rehab OT Goals Patient Stated Goal: sister desires placement of pt OT Goal Formulation: With patient/family Time For Goal Achievement: 01/22/20 Potential to Achieve Goals: Fair ADL Goals Pt Will Perform Grooming: with min assist;standing Pt Will Perform Upper Body Dressing: with supervision;sitting Pt Will Transfer to Toilet: with min assist;ambulating;bedside commode (over toilet) Pt Will Perform Toileting - Clothing Manipulation and hygiene: with min assist;sit to/from stand Additional ADL Goal #1: Pt will perform bed mobility with min assist in preparation for ADL.  OT Frequency: Min 2X/week   Barriers to D/C: Decreased caregiver support          Co-evaluation              AM-PAC OT "6 Clicks" Daily Activity     Outcome Measure Help from another person eating meals?: A Little Help from another person taking care of personal grooming?: A Lot Help from another person toileting,  which includes using toliet, bedpan, or urinal?: Total Help from another person bathing (including washing, rinsing, drying)?: A Lot Help from another person to put on and taking off regular upper body clothing?: A Little Help from another person to put on and taking off regular lower body clothing?: Total 6 Click Score: 12   End of Session    Activity Tolerance:   Patient left: in bed;with call bell/phone within reach;with bed alarm set;with family/visitor present  OT Visit Diagnosis: Unsteadiness on feet (R26.81);Other abnormalities of gait and mobility (R26.89);Muscle weakness (generalized) (M62.81);Pain;Other symptoms and signs involving cognitive function                Time: 1505-1550 OT Time Calculation (min): 45 min Charges:  OT General Charges $OT Visit: 1 Visit OT Evaluation $OT Eval Moderate Complexity: 1 Mod OT Treatments $Self Care/Home Management : 23-37 mins  Nestor Lewandowsky, OTR/L Acute Rehabilitation Services Pager: [phone] Office: [phone]  Malka So 01/08/2020, 4:13 PM

## 2020-01-08 NOTE — Progress Notes (Signed)
PROGRESS NOTE  [name] [name] BJY:[id number] DOB: 1940/06/03   PCP: [name], [name], MD  Patient is from: [facility] SNF  DOA: 01/02/2020 LOS: 6  Chief complaints: Confusion and shortness of breath  Brief Narrative / Interim history: 79 year old male with history of recent CVA in 11/2019 with residual dysarthria, HTN, HLD and spinal stenosis initially presented to [facility] with confusion and difficulty breathing.  Work-up revealed right-sided loculated pleural effusion with concern for empyema.  IR was not able to place chest tube at [facility] due to DVT [place] of pleural fluid.  He was evaluated by general surgery who recommended transfer to [facility] for CTS evaluation.  Patient was evaluated by CTS who recommended chest tube placement by IR, that was performed on 01/03/2020.  He is currently on IV Zosyn with significant output from chest tube.  Subjective: Seen and examined earlier this morning.  No major events overnight of this morning.  No complaints but not a great historian.  He is oriented to self and place.  He responds no to pain, trouble breathing or GI symptoms.  He had about 110 cc output from chest tube.  Objective: Vitals:   01/07/20 1331 01/07/20 1954 01/08/20 0410 01/08/20 0806  BP: 134/68 (!) 144/74 129/72 138/70  Pulse: 66 64 61 68  Resp: 16 18 18 17   Temp: 98 F (36.7 C) 98.8 F (37.1 C) 98 F (36.7 C) 98.2 F (36.8 C)  TempSrc:  Oral Oral Oral  SpO2: 96% 98% 100% 100%  Weight:      Height:        Intake/Output Summary (Last 24 hours) at 01/08/2020 1302 Last data filed at 01/08/2020 0865 Gross per 24 hour  Intake 389.54 ml  Output 1510 ml  Net -1120.46 ml   Filed Weights   01/03/20 1414  Weight: 66.8 kg    Examination:  GENERAL: No apparent distress.  Nontoxic. HEENT: MMM.  Vision and hearing grossly intact.  NECK: Supple.  No apparent JVD.  RESP: 100% on 2 L.  No IWOB.  Fair aeration bilaterally.  Chest tube to right chest. CVS:  RRR. Heart  sounds normal.  ABD/GI/GU: BS+. Abd soft, NTND.  MSK/EXT:  Moves extremities.  Significant muscle mass and subcu fat loss. SKIN: no apparent skin lesion or wound NEURO: Awake.  Oriented to self and place.  Limited insight.  Notable dysarthria and generalized weakness PSYCH: Calm. Normal affect.  Procedures:  12/3-pigtail chest tube placement by IR  Microbiology summarized: COVID-19 and influenza PCR nonreactive MRSA PCR nonreactive Pleural fluid culture NGTD  Assessment & Plan: Right-sided parapneumonic effusion/empyema -Culture data as above. -Pigtail chest tube to right chest on 12/3.  About 110 cc/24 hours CXR with a stable mod right pleural effusion. -Rocephin>> Zosyn 12/2>>> leukocytosis improving. -CVTS following.  Acute hypoxemic respiratory failure: Intermittently desaturated to 70s and 80s when attempted to wean his oxygen.  Currently on 2 L. -Wean oxygen as able -Incentive spirometry/OOB/PT/OT  History of CVA in October 2021 with residual dysarthria: Stable. -Continue aspirin, Plavix, Crestor  Essential hypertension: Normotensive. -Continue Cozaar  Anxiety/depression: Stable. -Continue Cymbalta  Dementia without behavioral disturbance: Stable.  Oriented x2 -Reorientation and delirium precautions.  Debility/physical deconditioning -PT/OT  Leukocytosis/bandemia: Due to #1.  Improving. -Continue monitoring  Severe malnutrition as evidenced by low BMI and significant muscle mass and subcu fat loss Body mass index is 21.13 kg/m. Nutrition Problem: Severe Malnutrition Etiology: chronic illness (CVA) Signs/Symptoms: moderate fat depletion, severe fat depletion, moderate muscle depletion, severe muscle depletion  Interventions: Ensure Enlive (each supplement provides 350kcal and 20 grams of protein), Magic cup, MVI   DVT prophylaxis:  enoxaparin (LOVENOX) injection 40 mg Start: 01/03/20 1000  Code Status: Full code Family Communication: Patient and/or RN.  Available if any question.  Status is: Inpatient  Remains inpatient appropriate because:Unsafe d/c plan, IV treatments appropriate due to intensity of illness or inability to take PO and Inpatient level of care appropriate due to severity of illness   Dispo:  Patient From: [facility]  Planned Disposition: [place]  Expected discharge date: 01/11/20  Medically stable for discharge: No        Consultants:  CVTS IR   Sch Meds:  Scheduled Meds: . aspirin EC  81 mg Oral Daily  . clopidogrel  75 mg Oral Daily  . DULoxetine  60 mg Oral Daily  . enoxaparin (LOVENOX) injection  40 mg Subcutaneous Daily  . feeding supplement  237 mL Oral TID BM  . losartan  50 mg Oral Daily  . multivitamin with minerals  1 tablet Oral Daily  . rosuvastatin  10 mg Oral Daily   Continuous Infusions: . piperacillin-tazobactam (ZOSYN)  IV 3.375 g (01/08/20 0625)   PRN Meds:.acetaminophen, ondansetron (ZOFRAN) IV  Antimicrobials: Anti-infectives (From admission, onward)   Start     Dose/Rate Route Frequency Ordered Stop   01/02/20 2330  piperacillin-tazobactam (ZOSYN) IVPB 3.375 g        3.375 g 12.5 mL/hr over 240 Minutes Intravenous Every 8 hours 01/02/20 2242         I have personally reviewed the following labs and images: CBC: Recent Labs  Lab 01/02/20 0359 01/03/20 0100 01/04/20 0302 01/05/20 0305 01/06/20 0412 01/07/20 0429 01/08/20 0016  WBC 15.0*   < > 16.8* 16.3* 16.4* 12.5* 13.0*  NEUTROABS 11.6*  --   --   --   --   --   --   HGB 11.6*   < > 10.6* 12.3* 10.1* 10.1* 9.9*  HCT 37.5*   < > 33.4* 38.8* 31.6* 33.6* 33.1*  MCV 82.4   < > 81.7 81.7 82.1 83.8 83.6  PLT 374   < > 429* 434* 403* 396 417*   < > = values in this interval not displayed.   BMP &GFR Recent Labs  Lab 01/02/20 0359 01/03/20 0100 01/08/20 0016  NA 136 136 135  K 4.1 4.0 4.6  CL 100 99 100  CO2 27 25 28   GLUCOSE 97 105* 102*  BUN 17 14 19   CREATININE 0.92 1.07  1.21  CALCIUM 8.2* 8.3* 8.3*  MG 2.0  --   --    Estimated Creatinine Clearance: 47.5 mL/min (by C-G formula based on SCr of 1.21 mg/dL). Liver & Pancreas: Recent Labs  Lab 01/03/20 0100  AST 24  ALT 19  ALKPHOS 65  BILITOT 0.8  PROT 6.4*  ALBUMIN 2.0*   No results for input(s): LIPASE, AMYLASE in the last 168 hours. No results for input(s): AMMONIA in the last 168 hours. Diabetic: No results for input(s): HGBA1C in the last 72 hours. No results for input(s): GLUCAP in the last 168 hours. Cardiac Enzymes: No results for input(s): CKTOTAL, CKMB, CKMBINDEX, TROPONINI in the last 168 hours. No results for input(s): PROBNP in the last 8760 hours. Coagulation Profile: No results for input(s): INR, PROTIME in the last 168 hours. Thyroid Function Tests: No results for input(s): TSH, T4TOTAL, FREET4, T3FREE, THYROIDAB in the last 72 hours. Lipid Profile: No results for input(s):  CHOL, HDL, LDLCALC, TRIG, CHOLHDL, LDLDIRECT in the last 72 hours. Anemia Panel: No results for input(s): VITAMINB12, FOLATE, FERRITIN, TIBC, IRON, RETICCTPCT in the last 72 hours. Urine analysis:    Component Value Date/Time   COLORURINE YELLOW (A) 12/29/2019 1127   APPEARANCEUR HAZY (A) 12/29/2019 1127   APPEARANCEUR Cloudy 01/02/2012 1326   LABSPEC 1.019 12/29/2019 1127   LABSPEC 1.017 01/02/2012 1326   PHURINE 5.0 12/29/2019 1127   GLUCOSEU NEGATIVE 12/29/2019 1127   GLUCOSEU NEGATIVE 09/26/2017 1104   HGBUR NEGATIVE 12/29/2019 [place] 12/29/2019 1127   BILIRUBINUR Negative 01/02/2012 [place] 12/29/2019 1127   PROTEINUR NEGATIVE 12/29/2019 1127   UROBILINOGEN 0.2 09/26/2017 1104   NITRITE NEGATIVE 12/29/2019 1127   LEUKOCYTESUR MODERATE (A) 12/29/2019 1127   LEUKOCYTESUR 3+ 01/02/2012 1326   Sepsis Labs: Invalid input(s): PROCALCITONIN, [place]  Microbiology: Recent Results (from the past 240 hour(s))  MRSA PCR Screening     Status: None    Collection Time: 01/02/20  6:38 AM   Specimen: Nasal Mucosa; Nasopharyngeal  Result Value Ref Range Status   MRSA by PCR NEGATIVE NEGATIVE Final    Comment:        The GeneXpert MRSA Assay (FDA approved for NASAL specimens only), is one component of a comprehensive MRSA colonization surveillance program. It is not intended to diagnose MRSA infection nor to guide or monitor treatment for MRSA infections. Performed at [facility], [place] [address], [place], [place] [address]   Aerobic/Anaerobic Culture (surgical/deep wound)     Status: None   Collection Time: 01/03/20  4:19 PM   Specimen: Abscess  Result Value Ref Range Status   Specimen Description ABSCESS  Final   Special Requests RIGHT PLEURAL  Final   Gram Stain   Final    RARE WBC PRESENT, PREDOMINANTLY PMN NO ORGANISMS SEEN    Culture   Final    No growth aerobically or anaerobically. Performed at [facility], [place] [address], [place], [place] [address]    Report Status 01/08/2020 FINAL  Final    Radiology Studies: DG Chest Port 1 View  Result Date: 01/08/2020 CLINICAL DATA:  Chest tube EXAM: PORTABLE CHEST 1 VIEW COMPARISON:  01/07/2020 FINDINGS: Right chest tube is in place. Continued moderate right pleural effusion. No pneumothorax. Right lower lobe atelectasis or infiltrate. No confluent opacity on the left. Heart is normal size. IMPRESSION: Stable position of the right chest tube without pneumothorax. Stable moderate right pleural effusion. Electronically Signed   By: Rolm Baptise M.D.   On: 01/08/2020 07:17     Wylee Dorantes T. [place]  If 7PM-7AM, please contact night-coverage [email or web address] 01/08/2020, 1:02 PM

## 2020-01-09 DIAGNOSIS — N289 Disorder of kidney and ureter, unspecified: Secondary | ICD-10-CM | POA: Diagnosis not present

## 2020-01-09 DIAGNOSIS — R5381 Other malaise: Secondary | ICD-10-CM | POA: Diagnosis not present

## 2020-01-09 DIAGNOSIS — Z8673 Personal history of transient ischemic attack (TIA), and cerebral infarction without residual deficits: Secondary | ICD-10-CM | POA: Diagnosis not present

## 2020-01-09 DIAGNOSIS — R471 Dysarthria and anarthria: Secondary | ICD-10-CM | POA: Diagnosis not present

## 2020-01-09 DIAGNOSIS — R4182 Altered mental status, unspecified: Secondary | ICD-10-CM | POA: Diagnosis not present

## 2020-01-09 DIAGNOSIS — F0390 Unspecified dementia without behavioral disturbance: Secondary | ICD-10-CM | POA: Diagnosis not present

## 2020-01-09 DIAGNOSIS — J869 Pyothorax without fistula: Secondary | ICD-10-CM | POA: Diagnosis not present

## 2020-01-09 DIAGNOSIS — E43 Unspecified severe protein-calorie malnutrition: Secondary | ICD-10-CM | POA: Diagnosis not present

## 2020-01-09 DIAGNOSIS — I10 Essential (primary) hypertension: Secondary | ICD-10-CM | POA: Diagnosis not present

## 2020-01-09 DIAGNOSIS — J189 Pneumonia, unspecified organism: Secondary | ICD-10-CM | POA: Diagnosis not present

## 2020-01-09 LAB — CBC WITH DIFFERENTIAL/PLATELET
Abs Immature Granulocytes: 0.11 10*3/uL — ABNORMAL HIGH (ref 0.00–0.07)
Basophils Absolute: 0.1 10*3/uL (ref 0.0–0.1)
Basophils Relative: 1 %
Eosinophils Absolute: 0.3 10*3/uL (ref 0.0–0.5)
Eosinophils Relative: 2 %
HCT: 32.9 % — ABNORMAL LOW (ref 39.0–52.0)
Hemoglobin: 10.5 g/dL — ABNORMAL LOW (ref 13.0–17.0)
Immature Granulocytes: 1 %
Lymphocytes Relative: 12 %
Lymphs Abs: 1.6 10*3/uL (ref 0.7–4.0)
MCH: 25.9 pg — ABNORMAL LOW (ref 26.0–34.0)
MCHC: 31.9 g/dL (ref 30.0–36.0)
MCV: 81.2 fL (ref 80.0–100.0)
Monocytes Absolute: 1.3 10*3/uL — ABNORMAL HIGH (ref 0.1–1.0)
Monocytes Relative: 10 %
Neutro Abs: 9.7 10*3/uL — ABNORMAL HIGH (ref 1.7–7.7)
Neutrophils Relative %: 74 %
Platelets: 404 10*3/uL — ABNORMAL HIGH (ref 150–400)
RBC: 4.05 MIL/uL — ABNORMAL LOW (ref 4.22–5.81)
RDW: 13.2 % (ref 11.5–15.5)
WBC: 13 10*3/uL — ABNORMAL HIGH (ref 4.0–10.5)
nRBC: 0 % (ref 0.0–0.2)

## 2020-01-09 LAB — MAGNESIUM: Magnesium: 1.8 mg/dL (ref 1.7–2.4)

## 2020-01-09 LAB — CREATININE, SERUM
Creatinine, Ser: 1.31 mg/dL — ABNORMAL HIGH (ref 0.61–1.24)
GFR, Estimated: 56 mL/min — ABNORMAL LOW (ref >60)

## 2020-01-09 LAB — PHOSPHORUS: Phosphorus: 2.9 mg/dL (ref 2.5–4.6)

## 2020-01-09 LAB — POTASSIUM: Potassium: 4.6 mmol/L (ref 3.5–5.1)

## 2020-01-09 NOTE — Progress Notes (Signed)
Physical Therapy Treatment Patient Details Name: [name] [name] MRN: [id number] DOB: 09-21-1940 Today's Date: 01/09/2020    History of Present Illness Pt is a 79 y.o. male (from SNF) admitted 01/02/20 as transfer from [facility] (admitted there 12/26/19) for CTS evaluation for possible VATS decortication. Workup revealed R-side loculated pleural efffusion with concern for empyema, possible PNA. PMH includes lumbar stenosis, renal disorder.    PT Comments    Pt supine on arrival, agreeable to therapy session with encouragement. Pt progress transfers and bed mobility, but continues to need modA and heavy use of bed features/rails to perform and increased time 2/2 cognition/decreased activity tolerance. Pt initiated pre-gait activity at EOB with RW and modA for balance but remains limited due to pain/fatigue. Will plan to progress OOBTC transfers and gait activity next session. Pt continues to benefit from skilled rehab in a post acute setting to maximize functional gains before returning home.  Follow Up Recommendations  SNF     Equipment Recommendations  Rolling walker with 5" wheels;3in1 (PT);Wheelchair (measurements PT);Wheelchair cushion (measurements PT)    Recommendations for Other Services       Precautions / Restrictions Precautions Precautions: Fall Precaution Comments:  chest tube, Watch SpO2; bladder/bowel incontinence Restrictions Weight Bearing Restrictions: No    Mobility  Bed Mobility Overal bed mobility: Needs Assistance Bed Mobility: Rolling;Sidelying to Sit;Sit to Sidelying Rolling: Min guard Sidelying to sit: Mod assist     Sit to sidelying: Mod assist General bed mobility comments: assist to raise trunk and for LEs back into bed  Transfers Overall transfer level: Needs assistance Equipment used: Rolling walker (2 wheeled) Transfers: Sit to/from Stand Sit to Stand: Mod assist;From elevated surface         General transfer comment: assist to rise and  steady, cues for hand placement  Ambulation/Gait Ambulation/Gait assistance: Mod assist Gait Distance (Feet): 2 Feet Assistive device: Rolling walker (2 wheeled) Gait Pattern/deviations: Step-to pattern;Trunk flexed;Leaning posteriorly Gait velocity: Decreased   General Gait Details: ~6 small shuffled steps toward HOB using RW, pt total A for RW management and max cues for step sequencing   Stairs             Wheelchair Mobility    Modified Rankin (Stroke Patients Only)       Balance Overall balance assessment: Needs assistance Sitting-balance support: Bilateral upper extremity supported;Feet supported Sitting balance-Leahy Scale: Fair Sitting balance - Comments: close supervision static sitting balance EOB, when performing LE exercises EOB pt with significant posterior lean   Standing balance support: Bilateral upper extremity supported Standing balance-Leahy Scale: Poor Standing balance comment: reliant on RW and external support                            Cognition Arousal/Alertness: Awake/alert Behavior During Therapy: Flat affect Overall Cognitive Status: History of cognitive impairments - at baseline Area of Impairment: Problem solving;Awareness;Safety/judgement;Following commands;Attention                 Orientation Level: Disoriented to;Place;Time Current Attention Level: Focused Memory: Decreased short-term memory Following Commands: Follows one step commands with increased time;Follows one step commands inconsistently Safety/Judgement: Decreased awareness of safety;Decreased awareness of deficits Awareness: Intellectual Problem Solving: Slow processing;Requires verbal cues;Requires tactile cues General Comments: pt with baseline dementia per sister      Exercises General Exercises - Lower Extremity Ankle Circles/Pumps: AROM;Strengthening;Both;10 reps Short Arc Quad: AAROM;Strengthening;Both;10 reps;Supine Long Arc Quad:  AAROM;Strengthening;Both;10 reps;Seated Heel Slides: AAROM;10 reps;Both Hip ABduction/ADduction:  AAROM;Strengthening;Both;10 reps    General Comments        Pertinent Vitals/Pain Pain Assessment: Faces Faces Pain Scale: Hurts little more Pain Location: unable to localize Pain Intervention(s): Monitored during session;Repositioned    Home Living                      Prior Function            PT Goals (current goals can now be found in the care plan section) Acute Rehab PT Goals Patient Stated Goal: sister desires placement of pt PT Goal Formulation: With patient/family Time For Goal Achievement: 01/17/20 Potential to Achieve Goals: Fair Progress towards PT goals: Progressing toward goals (slow progress)    Frequency    Min 2X/week      PT Plan Current plan remains appropriate    Co-evaluation              AM-PAC PT "6 Clicks" Mobility   Outcome Measure  Help needed turning from your back to your side while in a flat bed without using bedrails?: A Little Help needed moving from lying on your back to sitting on the side of a flat bed without using bedrails?: A Lot Help needed moving to and from a bed to a chair (including a wheelchair)?: A Lot Help needed standing up from a chair using your arms (e.g., wheelchair or bedside chair)?: A Lot Help needed to walk in hospital room?: A Lot Help needed climbing 3-5 steps with a railing? : Total 6 Click Score: 12    End of Session Equipment Utilized During Treatment: Oxygen Activity Tolerance: Patient limited by fatigue;Patient tolerated treatment well Patient left: in bed;with call bell/phone within reach;with bed alarm set Nurse Communication: Mobility status PT Visit Diagnosis: Difficulty in walking, not elsewhere classified (R26.2);Muscle weakness (generalized) (M62.81)     Time: 0017-4944 PT Time Calculation (min) (ACUTE ONLY): 23 min  Charges:  $Therapeutic Exercise: 8-22 mins $Therapeutic  Activity: 8-22 mins                     Darvin Dials P., PTA Acute Rehabilitation Services Pager: [phone] Office: [place] 01/09/2020, 5:27 PM

## 2020-01-09 NOTE — Progress Notes (Signed)
PROGRESS NOTE  [name] [name] KGM:[id number] DOB: October 04, 1940   PCP: [name], [name], MD  Patient is from: [facility]  DOA: 01/02/2020 LOS: 7  Chief complaints: Confusion and shortness of breath  Brief Narrative / Interim history: 79 year old male with history of recent CVA in 11/2019 with residual dysarthria, HTN, HLD and spinal stenosis initially presented to [facility] with confusion and difficulty breathing.  Work-up revealed right-sided loculated pleural effusion with concern for empyema.  IR was not able to place chest tube at [facility] due to DVT Curnes of pleural fluid.  He was evaluated by general surgery who recommended transfer to [facility] for CTS evaluation.  Patient was evaluated by CTS who recommended chest tube placement by IR, that was performed on 01/03/2020.  He is currently on IV Zosyn with significant output from chest tube.  Subjective: Seen and examined earlier this morning.  No major events overnight of this morning.  No complaints.  Responds no to pain, dyspnea, GI or UTI symptoms.  Not a great historian.  Oriented to self, city, state and some of his situations.  About 200 cc output from chest tube.  Objective: Vitals:   01/08/20 1506 01/08/20 2049 01/09/20 0411 01/09/20 1328  BP: 119/66 131/72 130/67 (!) 108/56  Pulse: 76 63 60 67  Resp: 18 16 16 17   Temp: 97.9 F (36.6 C) 98.7 F (37.1 C) 97.6 F (36.4 C) 98 F (36.7 C)  TempSrc: Oral Oral Oral Oral  SpO2: 100% 100% 100% 99%  Weight:      Height:        Intake/Output Summary (Last 24 hours) at 01/09/2020 1416 Last data filed at 01/09/2020 1326 Gross per 24 hour  Intake 250 ml  Output 2250 ml  Net -2000 ml   Filed Weights   01/03/20 1414  Weight: 66.8 kg    Examination:  GENERAL: No apparent distress.  Nontoxic. HEENT: MMM.  Vision and hearing grossly intact.  NECK: Supple.  No apparent JVD.  RESP: 100% on 2 L.  No IWOB.  Fair aeration bilaterally.  Chest tube to the right chest. CVS:   RRR. Heart sounds normal.  ABD/GI/GU: BS+. Abd soft, NTND.  MSK/EXT:  Moves extremities.  Significant muscle mass and subcu fat loss. SKIN: no apparent skin lesion or wound NEURO: Awake and alert.  Oriented to self, place and some of the situations.  No apparent focal neuro deficit. PSYCH: Calm. Normal affect.   Procedures:  12/3-pigtail chest tube placement by IR  Microbiology summarized: COVID-19 and influenza PCR nonreactive MRSA PCR nonreactive Pleural fluid culture NGTD  Assessment & Plan: Right-sided parapneumonic effusion/empyema -Pigtail chest tube to right chest on 12/3.  About 200 cc / 24 hours from chest tube. -Not a candidate for VATS surgery with severe malnutrition, frailty and comorbidities. -Plan to transition to water seal using mini express system when drainage < 50cc/day and d/c to SNF -Culture data as above. -Rocephin>> Zosyn 12/2>>> leukocytosis improving. -CVTS following.  Acute hypoxemic respiratory failure: Intermittently desaturated to 70s and 80s when attempted to wean his oxygen.  Currently on 2 L. -Wean oxygen as able -Incentive spirometry/OOB/PT/OT  History of CVA in October 2021 with residual dysarthria: Stable. -Continue aspirin, Plavix, Crestor  Essential hypertension: Normotensive. -Continue Cozaar  Anxiety/depression: Stable. -Continue Cymbalta  Dementia without behavioral disturbance: Stable.  Oriented x2 -Reorientation and delirium precautions.  Debility/physical deconditioning -PT/OT  Leukocytosis/bandemia: Due to #1.  Improving. -Continue monitoring  Severe malnutrition as evidenced by low BMI and significant muscle  mass and subcu fat loss Body mass index is 21.13 kg/m. Nutrition Problem: Severe Malnutrition Etiology: chronic illness (CVA) Signs/Symptoms: moderate fat depletion,severe fat depletion,moderate muscle depletion,severe muscle depletion Interventions: Ensure Enlive (each supplement provides 350kcal and 20 grams  of protein),Magic cup,MVI   DVT prophylaxis:  enoxaparin (LOVENOX) injection 40 mg Start: 01/03/20 1000  Code Status: Full code Family Communication: Patient and/or RN. Available if any question.  Status is: Inpatient  Remains inpatient appropriate because:Unsafe d/c plan, IV treatments appropriate due to intensity of illness or inability to take PO and Inpatient level of care appropriate due to severity of illness   Dispo:  Patient From: [place]  Planned Disposition: [place]  Expected discharge date: 01/13/2020  Medically stable for discharge: No        Consultants:  CVTS IR   Sch Meds:  Scheduled Meds: . aspirin EC  81 mg Oral Daily  . clopidogrel  75 mg Oral Daily  . DULoxetine  60 mg Oral Daily  . enoxaparin (LOVENOX) injection  40 mg Subcutaneous Daily  . feeding supplement  237 mL Oral TID BM  . losartan  50 mg Oral Daily  . multivitamin with minerals  1 tablet Oral Daily  . rosuvastatin  10 mg Oral Daily   Continuous Infusions: . piperacillin-tazobactam (ZOSYN)  IV 3.375 g (01/09/20 1348)   PRN Meds:.acetaminophen, ondansetron (ZOFRAN) IV  Antimicrobials: Anti-infectives (From admission, onward)   Start     Dose/Rate Route Frequency Ordered Stop   01/02/20 2330  piperacillin-tazobactam (ZOSYN) IVPB 3.375 g        3.375 g 12.5 mL/hr over 240 Minutes Intravenous Every 8 hours 01/02/20 2242         I have personally reviewed the following labs and images: CBC: Recent Labs  Lab 01/05/20 0305 01/06/20 0412 01/07/20 0429 01/08/20 0016 01/09/20 0443  WBC 16.3* 16.4* 12.5* 13.0* 13.0*  NEUTROABS  --   --   --   --  9.7*  HGB 12.3* 10.1* 10.1* 9.9* 10.5*  HCT 38.8* 31.6* 33.6* 33.1* 32.9*  MCV 81.7 82.1 83.8 83.6 81.2  PLT 434* 403* 396 417* 404*   BMP &GFR Recent Labs  Lab 01/03/20 0100 01/08/20 0016 01/09/20 0443  NA 136 135  --   K 4.0 4.6 4.6  CL 99 100  --   CO2 25 28  --   GLUCOSE 105* 102*  --    BUN 14 19  --   CREATININE 1.07 1.21 1.31*  CALCIUM 8.3* 8.3*  --   MG  --   --  1.8  PHOS  --   --  2.9   Estimated Creatinine Clearance: 43.9 mL/min (A) (by C-G formula based on SCr of 1.31 mg/dL (H)). Liver & Pancreas: Recent Labs  Lab 01/03/20 0100  AST 24  ALT 19  ALKPHOS 65  BILITOT 0.8  PROT 6.4*  ALBUMIN 2.0*   No results for input(s): LIPASE, AMYLASE in the last 168 hours. No results for input(s): AMMONIA in the last 168 hours. Diabetic: No results for input(s): HGBA1C in the last 72 hours. No results for input(s): GLUCAP in the last 168 hours. Cardiac Enzymes: No results for input(s): CKTOTAL, CKMB, CKMBINDEX, TROPONINI in the last 168 hours. No results for input(s): PROBNP in the last 8760 hours. Coagulation Profile: No results for input(s): INR, PROTIME in the last 168 hours. Thyroid Function Tests: No results for input(s): TSH, T4TOTAL, FREET4, T3FREE, THYROIDAB in the last 72 hours. Lipid Profile:  No results for input(s): CHOL, HDL, LDLCALC, TRIG, CHOLHDL, LDLDIRECT in the last 72 hours. Anemia Panel: No results for input(s): VITAMINB12, FOLATE, FERRITIN, TIBC, IRON, RETICCTPCT in the last 72 hours. Urine analysis:    Component Value Date/Time   COLORURINE YELLOW (A) 12/29/2019 1127   APPEARANCEUR HAZY (A) 12/29/2019 1127   APPEARANCEUR Cloudy 01/02/2012 1326   LABSPEC 1.019 12/29/2019 1127   LABSPEC 1.017 01/02/2012 1326   PHURINE 5.0 12/29/2019 1127   GLUCOSEU NEGATIVE 12/29/2019 1127   GLUCOSEU NEGATIVE 09/26/2017 1104   HGBUR NEGATIVE 12/29/2019 [place] 12/29/2019 1127   BILIRUBINUR Negative 01/02/2012 [place] 12/29/2019 1127   PROTEINUR NEGATIVE 12/29/2019 1127   UROBILINOGEN 0.2 09/26/2017 1104   NITRITE NEGATIVE 12/29/2019 1127   LEUKOCYTESUR MODERATE (A) 12/29/2019 1127   LEUKOCYTESUR 3+ 01/02/2012 1326   Sepsis Labs: Invalid input(s): PROCALCITONIN, [place]  Microbiology: Recent Results (from  the past 240 hour(s))  MRSA PCR Screening     Status: None   Collection Time: 01/02/20  6:38 AM   Specimen: Nasal Mucosa; Nasopharyngeal  Result Value Ref Range Status   MRSA by PCR NEGATIVE NEGATIVE Final    Comment:        The GeneXpert MRSA Assay (FDA approved for NASAL specimens only), is one component of a comprehensive MRSA colonization surveillance program. It is not intended to diagnose MRSA infection nor to guide or monitor treatment for MRSA infections. Performed at [facility], [place] [address] [place], [place] [address]   Aerobic/Anaerobic Culture (surgical/deep wound)     Status: None   Collection Time: 01/03/20  4:19 PM   Specimen: Abscess  Result Value Ref Range Status   Specimen Description ABSCESS  Final   Special Requests RIGHT PLEURAL  Final   Gram Stain   Final    RARE WBC PRESENT, PREDOMINANTLY PMN NO ORGANISMS SEEN    Culture   Final    No growth aerobically or anaerobically. Performed at [facility], [place] [address] [place],  [address]    Report Status 01/08/2020 FINAL  Final    Radiology Studies: No results found.   Winton Offord T. Bartholomew  If 7PM-7AM, please contact night-coverage [email or web address] 01/09/2020, 2:16 PM

## 2020-01-09 NOTE — Plan of Care (Signed)
  Problem: Education: Goal: Knowledge of General Education information will improve Description: Including pain rating scale, medication(s)/side effects and non-pharmacologic comfort measures Outcome: Progressing   Problem: Clinical Measurements: Goal: Ability to maintain clinical measurements within normal limits will improve Outcome: Progressing Goal: Will remain free from infection Outcome: Progressing Goal: Diagnostic test results will improve Outcome: Progressing Goal: Respiratory complications will improve Outcome: Progressing Goal: Cardiovascular complication will be avoided Outcome: Progressing   Problem: Pain Managment: Goal: General experience of comfort will improve Outcome: Progressing   Problem: Skin Integrity: Goal: Risk for impaired skin integrity will decrease Outcome: Progressing

## 2020-01-09 NOTE — Progress Notes (Addendum)
      [address]       [place], [address]             [phone]           Subjective: Sleeping but awakened easily. Says he is dong OK, no new concerns.   Objective: Vital signs in last 24 hours: Temp:  [97.6 F (36.4 C)-98.7 F (37.1 C)] 97.6 F (36.4 C) (12/09 0411) Pulse Rate:  [60-76] 60 (12/09 0411) Cardiac Rhythm: Normal sinus rhythm;Bundle branch block (12/08 1900) Resp:  [16-18] 16 (12/09 0411) BP: (119-131)/(66-72) 130/67 (12/09 0411) SpO2:  [100 %] 100 % (12/09 0411)   Intake/Output from previous day: 12/08 0701 - 12/09 0700 In: 320 [P.O.:320] Out: 1750 [Urine:1600; Chest Tube:150]   Physical Exam:  Cardiovascular: RRR Pulmonary: Clear to auscultation on left and remains diminished right base. The right CT drained ~144ml/day for past 24hrs. No air leak. The CT remains on suction.  Chest Tube: well secured, site is clean and dry. No new CXR today but images have been stable past few days.   Lab Results: CBC: Recent Labs    01/08/20 0016 01/09/20 0443  WBC 13.0* 13.0*  HGB 9.9* 10.5*  HCT 33.1* 32.9*  PLT 417* 404*   BMET:  Recent Labs    01/08/20 0016 01/09/20 0443  NA 135  --   K 4.6 4.6  CL 100  --   CO2 28  --   GLUCOSE 102*  --   BUN 19  --   CREATININE 1.21 1.31*  CALCIUM 8.3*  --     PT/INR:  No results for input(s): LABPROT, INR in the last 72 hours.    Assessment/Plan:  -POD-6 placement of a right 65fr pleural tube by IR for para-pneumonic effusion / empyema. Pleural fluid cx's had no growth at 4 days. Has straw-colored, clear serous drainage with volumes of 100-196ml/day. Has mild, stable leukocytosis,  he is on Zosyn. CXR is stable with CT in good position low in the right hemithorax.  CT management per Dr. Darcey Nora.  We will continue to follow.   Joline Maxcy 01/09/2020,8:24 AM [id number]    R chest tube total 550 cc- in past 48 hrs has drained 200cc   Appetite and oral intake  improved   CXR stable , afeb with wbc 13k   No growth from pleural fluid c/w para pneumonic effusion  Not candidate for VATS with severe malnutrition, fragility and co-morbidities Cont chest tube to suction - when drainage 50cc/ hr transition to water seal using mini express system he could go to SNF with for outpatient followup  patient examined and medical record reviewed,agree with above note. Tharon Aquas Trigt III 01/09/2020

## 2020-01-10 ENCOUNTER — Inpatient Hospital Stay (HOSPITAL_COMMUNITY): Payer: Medicare HMO

## 2020-01-10 DIAGNOSIS — R471 Dysarthria and anarthria: Secondary | ICD-10-CM | POA: Diagnosis not present

## 2020-01-10 DIAGNOSIS — N289 Disorder of kidney and ureter, unspecified: Secondary | ICD-10-CM

## 2020-01-10 DIAGNOSIS — J189 Pneumonia, unspecified organism: Secondary | ICD-10-CM | POA: Diagnosis not present

## 2020-01-10 DIAGNOSIS — R4182 Altered mental status, unspecified: Secondary | ICD-10-CM | POA: Diagnosis not present

## 2020-01-10 DIAGNOSIS — Z8673 Personal history of transient ischemic attack (TIA), and cerebral infarction without residual deficits: Secondary | ICD-10-CM | POA: Diagnosis not present

## 2020-01-10 DIAGNOSIS — I10 Essential (primary) hypertension: Secondary | ICD-10-CM | POA: Diagnosis not present

## 2020-01-10 DIAGNOSIS — E43 Unspecified severe protein-calorie malnutrition: Secondary | ICD-10-CM | POA: Diagnosis not present

## 2020-01-10 DIAGNOSIS — J90 Pleural effusion, not elsewhere classified: Secondary | ICD-10-CM | POA: Diagnosis not present

## 2020-01-10 DIAGNOSIS — F0390 Unspecified dementia without behavioral disturbance: Secondary | ICD-10-CM | POA: Diagnosis not present

## 2020-01-10 DIAGNOSIS — J869 Pyothorax without fistula: Secondary | ICD-10-CM | POA: Diagnosis not present

## 2020-01-10 DIAGNOSIS — R5381 Other malaise: Secondary | ICD-10-CM | POA: Diagnosis not present

## 2020-01-10 LAB — RENAL FUNCTION PANEL
Albumin: 2 g/dL — ABNORMAL LOW (ref 3.5–5.0)
Anion gap: 9 (ref 5–15)
BUN: 21 mg/dL (ref 8–23)
CO2: 27 mmol/L (ref 22–32)
Calcium: 8.2 mg/dL — ABNORMAL LOW (ref 8.9–10.3)
Chloride: 101 mmol/L (ref 98–111)
Creatinine, Ser: 1.34 mg/dL — ABNORMAL HIGH (ref 0.61–1.24)
GFR, Estimated: 54 mL/min — ABNORMAL LOW (ref >60)
Glucose, Bld: 109 mg/dL — ABNORMAL HIGH (ref 70–99)
Phosphorus: 3.1 mg/dL (ref 2.5–4.6)
Potassium: 4.5 mmol/L (ref 3.5–5.1)
Sodium: 137 mmol/L (ref 135–145)

## 2020-01-10 LAB — MAGNESIUM: Magnesium: 1.8 mg/dL (ref 1.7–2.4)

## 2020-01-10 NOTE — Progress Notes (Signed)
PROGRESS NOTE  [name] [name] TWS:[id number] DOB: [birth year]   PCP: [name], [name], MD  Patient is from: [facility]  DOA: 01/02/2020 LOS: 8  Chief complaints: Confusion and shortness of breath  Brief Narrative / Interim history: 79 year old male with history of recent CVA in 11/2019 with residual dysarthria, HTN, HLD and spinal stenosis initially presented to [facility] with confusion and difficulty breathing.  Work-up revealed right-sided loculated pleural effusion with concern for empyema.  IR was not able to place chest tube at [facility] due to DVT Curnes of pleural fluid.  He was evaluated by general surgery who recommended transfer to [facility] for CTS evaluation.  Patient was evaluated by CTS who recommended chest tube placement by IR, that was performed on 01/03/2020.  He is currently on IV Zosyn with significant output from chest tube.  Plan to transition to water seal using mini express system when drainage < 50cc/day and d/c to SNF.  He is a still on IV Zosyn.  Subjective: Seen and examined earlier this morning.  No major events overnight of this morning.  No complaints but not a great historian.  About 130 to 160 cc output from chest tube/24 hours.  Objective: Vitals:   01/09/20 1957 01/10/20 0552 01/10/20 1102 01/10/20 1355  BP: 118/60 120/85  112/65  Pulse: 70 (!) 57 65 79  Resp: 16 16    Temp: 98.4 F (36.9 C) 97.9 F (36.6 C)  98 F (36.7 C)  TempSrc: Oral   Oral  SpO2: 99% 99% 97% 97%  Weight:      Height:        Intake/Output Summary (Last 24 hours) at 01/10/2020 1437 Last data filed at 01/10/2020 1052 Gross per 24 hour  Intake 220 ml  Output 2260 ml  Net -2040 ml   Filed Weights   01/03/20 1414  Weight: 66.8 kg    Examination:  GENERAL: No apparent distress.  Nontoxic. HEENT: MMM.  Vision and hearing grossly intact.  NECK: Supple.  No apparent JVD.  RESP: 100% on 2 L by [place].  No IWOB.  Fair aeration bilaterally.  Chest tube to right  chest. CVS:  RRR. Heart sounds normal.  ABD/GI/GU: BS+. Abd soft, NTND.  MSK/EXT:  Moves extremities.  Significant muscle mass and subcu fat loss. SKIN: no apparent skin lesion or wound NEURO: Awake and alert.  Oriented to self and place.  No apparent focal neuro deficit other than generalized weakness. PSYCH: Calm. Normal affect.  Procedures:  12/3-pigtail chest tube placement by IR  Microbiology summarized: COVID-19 and influenza PCR nonreactive MRSA PCR nonreactive Pleural fluid culture NGTD  Assessment & Plan: Right-sided parapneumonic effusion/empyema -Pigtail chest tube to right chest on 12/3.  About 200 cc / 24 hours from chest tube. -Not a candidate for VATS surgery with severe malnutrition, frailty and comorbidities. -Plan to transition to water seal using mini express system when drainage < 50cc/day and d/c to SNF -Culture data as above. -Rocephin>> Zosyn 12/2>>> leukocytosis improving.  Will discuss with ID about antibiotic duration -CVTS following.  Acute hypoxemic respiratory failure: Intermittently desaturated to 70s and 80s when attempted to wean his oxygen.  Currently on 2 L. -Wean oxygen as able -Incentive spirometry/OOB/PT/OT  Renal insufficiency: Cr plateauing at 1.3. Doesn't meet criteria for AKI or CKD yet.  He is on Zosyn and losartan -Discontinue losartan -Continue monitoring  History of CVA in October 2021 with residual dysarthria: Stable. -Continue aspirin, Plavix, Crestor  Essential hypertension: Normotensive. -Discontinue Cozaar in the  setting of renal insufficiency  Anxiety/depression: Stable. -Continue Cymbalta  Dementia without behavioral disturbance: Stable.  Oriented x2 -Reorientation and delirium precautions.  Debility/physical deconditioning -PT/OT  Leukocytosis/bandemia: Due to #1.  Improving. -Continue monitoring  Severe malnutrition as evidenced by low BMI and significant muscle mass and subcu fat loss Body mass index is  21.13 kg/m. Nutrition Problem: Severe Malnutrition Etiology: chronic illness (CVA) Signs/Symptoms: moderate fat depletion,severe fat depletion,moderate muscle depletion,severe muscle depletion Interventions: Ensure Enlive (each supplement provides 350kcal and 20 grams of protein),Magic cup,MVI   DVT prophylaxis:  enoxaparin (LOVENOX) injection 40 mg Start: 01/03/20 1000  Code Status: Full code Family Communication: Patient and/or RN. Available if any question.  Status is: Inpatient  Remains inpatient appropriate because:Unsafe d/c plan, IV treatments appropriate due to intensity of illness or inability to take PO and Inpatient level of care appropriate due to severity of illness   Dispo:  Patient From: [place]  Planned Disposition: [place]  Expected discharge date: 01/13/2020  Medically stable for discharge: No        Consultants:  CVTS IR   Sch Meds:  Scheduled Meds: . aspirin EC  81 mg Oral Daily  . clopidogrel  75 mg Oral Daily  . DULoxetine  60 mg Oral Daily  . enoxaparin (LOVENOX) injection  40 mg Subcutaneous Daily  . feeding supplement  237 mL Oral TID BM  . losartan  50 mg Oral Daily  . multivitamin with minerals  1 tablet Oral Daily  . rosuvastatin  10 mg Oral Daily   Continuous Infusions: . piperacillin-tazobactam (ZOSYN)  IV 3.375 g (01/10/20 1407)   PRN Meds:.acetaminophen, ondansetron (ZOFRAN) IV  Antimicrobials: Anti-infectives (From admission, onward)   Start     Dose/Rate Route Frequency Ordered Stop   01/02/20 2330  piperacillin-tazobactam (ZOSYN) IVPB 3.375 g        3.375 g 12.5 mL/hr over 240 Minutes Intravenous Every 8 hours 01/02/20 2242         I have personally reviewed the following labs and images: CBC: Recent Labs  Lab 01/05/20 0305 01/06/20 0412 01/07/20 0429 01/08/20 0016 01/09/20 0443  WBC 16.3* 16.4* 12.5* 13.0* 13.0*  NEUTROABS  --   --   --   --  9.7*  HGB 12.3* 10.1* 10.1* 9.9*  10.5*  HCT 38.8* 31.6* 33.6* 33.1* 32.9*  MCV 81.7 82.1 83.8 83.6 81.2  PLT 434* 403* 396 417* 404*   BMP &GFR Recent Labs  Lab 01/08/20 0016 01/09/20 0443 01/10/20 0049  NA 135  --  137  K 4.6 4.6 4.5  CL 100  --  101  CO2 28  --  27  GLUCOSE 102*  --  109*  BUN 19  --  21  CREATININE 1.21 1.31* 1.34*  CALCIUM 8.3*  --  8.2*  MG  --  1.8 1.8  PHOS  --  2.9 3.1   Estimated Creatinine Clearance: 42.9 mL/min (A) (by C-G formula based on SCr of 1.34 mg/dL (H)). Liver & Pancreas: Recent Labs  Lab 01/10/20 0049  ALBUMIN 2.0*   No results for input(s): LIPASE, AMYLASE in the last 168 hours. No results for input(s): AMMONIA in the last 168 hours. Diabetic: No results for input(s): HGBA1C in the last 72 hours. No results for input(s): GLUCAP in the last 168 hours. Cardiac Enzymes: No results for input(s): CKTOTAL, CKMB, CKMBINDEX, TROPONINI in the last 168 hours. No results for input(s): PROBNP in the last 8760 hours. Coagulation Profile: No results for  input(s): INR, PROTIME in the last 168 hours. Thyroid Function Tests: No results for input(s): TSH, T4TOTAL, FREET4, T3FREE, THYROIDAB in the last 72 hours. Lipid Profile: No results for input(s): CHOL, HDL, LDLCALC, TRIG, CHOLHDL, LDLDIRECT in the last 72 hours. Anemia Panel: No results for input(s): VITAMINB12, FOLATE, FERRITIN, TIBC, IRON, RETICCTPCT in the last 72 hours. Urine analysis:    Component Value Date/Time   COLORURINE YELLOW (A) 12/29/2019 1127   APPEARANCEUR HAZY (A) 12/29/2019 1127   APPEARANCEUR Cloudy 01/02/2012 1326   LABSPEC 1.019 12/29/2019 1127   LABSPEC 1.017 01/02/2012 1326   PHURINE 5.0 12/29/2019 1127   GLUCOSEU NEGATIVE 12/29/2019 1127   GLUCOSEU NEGATIVE 09/26/2017 1104   HGBUR NEGATIVE 12/29/2019 [place] 12/29/2019 1127   BILIRUBINUR Negative 01/02/2012 [place] 12/29/2019 1127   PROTEINUR NEGATIVE 12/29/2019 1127   UROBILINOGEN 0.2 09/26/2017 1104    NITRITE NEGATIVE 12/29/2019 1127   LEUKOCYTESUR MODERATE (A) 12/29/2019 1127   LEUKOCYTESUR 3+ 01/02/2012 1326   Sepsis Labs: Invalid input(s): PROCALCITONIN, [place]  Microbiology: Recent Results (from the past 240 hour(s))  MRSA PCR Screening     Status: None   Collection Time: 01/02/20  6:38 AM   Specimen: Nasal Mucosa; Nasopharyngeal  Result Value Ref Range Status   MRSA by PCR NEGATIVE NEGATIVE Final    Comment:        The GeneXpert MRSA Assay (FDA approved for NASAL specimens only), is one component of a comprehensive MRSA colonization surveillance program. It is not intended to diagnose MRSA infection nor to guide or monitor treatment for MRSA infections. Performed at [facility], [place] [address], [place], [place] [address]   Aerobic/Anaerobic Culture (surgical/deep wound)     Status: None   Collection Time: 01/03/20  4:19 PM   Specimen: Abscess  Result Value Ref Range Status   Specimen Description ABSCESS  Final   Special Requests RIGHT PLEURAL  Final   Gram Stain   Final    RARE WBC PRESENT, PREDOMINANTLY PMN NO ORGANISMS SEEN    Culture   Final    No growth aerobically or anaerobically. Performed at [facility], [place] [address], [place], [place] [address]    Report Status 01/08/2020 FINAL  Final    Radiology Studies: DG Chest Port 1 View  Result Date: 01/10/2020 CLINICAL DATA:  Chest tube. EXAM: PORTABLE CHEST 1 VIEW COMPARISON:  01/08/2020.  CT 01/01/2020. FINDINGS: Right chest tube in stable position. No pneumothorax. Unchanged right pleural effusion with probable loculation. Persistent right base atelectasis/infiltrate. Mediastinum and hilar structures normal. Heart size stable. Degenerative change cervicothoracic spine. IMPRESSION: Right chest tube in stable position. No pneumothorax. Unchanged right pleural effusion with probable loculation. Persistent right base atelectasis/infiltrate. Electronically Signed   By: Marcello Moores  Register   On:  01/10/2020 06:10     Fay Bagg T. Frederick  If 7PM-7AM, please contact night-coverage [email or web address] 01/10/2020, 2:37 PM

## 2020-01-10 NOTE — Progress Notes (Addendum)
      [address]       [place] [address]             [phone]           Subjective: Sitting up, alert and cooperative, being fed breakfast.   Objective: Vital signs in last 24 hours: Temp:  [97.9 F (36.6 C)-98.4 F (36.9 C)] 97.9 F (36.6 C) (12/10 0552) Pulse Rate:  [57-70] 57 (12/10 0552) Cardiac Rhythm: Normal sinus rhythm;Bundle branch block (12/09 1900) Resp:  [16-17] 16 (12/10 0552) BP: (108-120)/(56-85) 120/85 (12/10 0552) SpO2:  [99 %] 99 % (12/10 0552)   Intake/Output from previous day: 12/09 0701 - 12/10 0700 In: 350 [P.O.:250; IV Piggyback:100] Out: 660 [Urine:500; Chest Tube:160]   Physical Exam:  Cardiovascular: RRR Pulmonary: Clear to auscultation on left and remains diminished right base. The right chest tube continues to drain about 171ml clear serous fluid each day.  No air leak. The CT remains on suction.  Chest Tube: well secured, site is clean and dry.  The tube remains well positioned on the CXR with no significant change in the right effusion.    Lab Results: CBC: Recent Labs    01/08/20 0016 01/09/20 0443  WBC 13.0* 13.0*  HGB 9.9* 10.5*  HCT 33.1* 32.9*  PLT 417* 404*   BMET:  Recent Labs    01/08/20 0016 01/09/20 0443 01/10/20 0049  NA 135  --  137  K 4.6 4.6 4.5  CL 100  --  101  CO2 28  --  27  GLUCOSE 102*  --  109*  BUN 19  --  21  CREATININE 1.21 1.31* 1.34*  CALCIUM 8.3*  --  8.2*    EXAM: PORTABLE CHEST 1 VIEW  COMPARISON:  01/08/2020.  CT 01/01/2020.  FINDINGS: Right chest tube in stable position. No pneumothorax. Unchanged right pleural effusion with probable loculation. Persistent right base atelectasis/infiltrate. Mediastinum and hilar structures normal. Heart size stable. Degenerative change cervicothoracic spine.  IMPRESSION: Right chest tube in stable position. No pneumothorax. Unchanged right pleural effusion with probable loculation. Persistent right base  atelectasis/infiltrate.   Electronically Signed   By: Marcello Moores  Register   On: 01/10/2020 06:10  Assessment/Plan:  -POD-7 placement of a right 37fr pleural tube by IR for para-pneumonic effusion / empyema. Pleural fluid cx's had no growth at 4 days. Has straw-colored, clear serous drainage with volumes of 100-19ml/day. Remains on Zosyn per primary team. CXR is stable with CT in good position low in the right hemithorax.  Per Dr. Darcey Nora:  Keep CT to suction and when drainage is <62ml/day transition to MiniExpress collection system which can be managed at the SNF and followed in our office as outpatient.     Myron G. [id number] 01/10/2020,10:10 AM [phone].[email or web address]   patient examined and medical record reviewed,agree with above note. Tharon Aquas Trigt III 01/10/2020

## 2020-01-11 DIAGNOSIS — R6520 Severe sepsis without septic shock: Secondary | ICD-10-CM | POA: Diagnosis not present

## 2020-01-11 DIAGNOSIS — R5381 Other malaise: Secondary | ICD-10-CM | POA: Diagnosis not present

## 2020-01-11 DIAGNOSIS — Z8673 Personal history of transient ischemic attack (TIA), and cerebral infarction without residual deficits: Secondary | ICD-10-CM | POA: Diagnosis not present

## 2020-01-11 DIAGNOSIS — J181 Lobar pneumonia, unspecified organism: Secondary | ICD-10-CM

## 2020-01-11 DIAGNOSIS — R471 Dysarthria and anarthria: Secondary | ICD-10-CM | POA: Diagnosis not present

## 2020-01-11 DIAGNOSIS — F0390 Unspecified dementia without behavioral disturbance: Secondary | ICD-10-CM | POA: Diagnosis not present

## 2020-01-11 DIAGNOSIS — N289 Disorder of kidney and ureter, unspecified: Secondary | ICD-10-CM | POA: Diagnosis not present

## 2020-01-11 DIAGNOSIS — E43 Unspecified severe protein-calorie malnutrition: Secondary | ICD-10-CM | POA: Diagnosis not present

## 2020-01-11 DIAGNOSIS — J869 Pyothorax without fistula: Secondary | ICD-10-CM | POA: Diagnosis not present

## 2020-01-11 DIAGNOSIS — A419 Sepsis, unspecified organism: Secondary | ICD-10-CM | POA: Diagnosis not present

## 2020-01-11 DIAGNOSIS — J189 Pneumonia, unspecified organism: Secondary | ICD-10-CM | POA: Diagnosis not present

## 2020-01-11 DIAGNOSIS — I10 Essential (primary) hypertension: Secondary | ICD-10-CM | POA: Diagnosis not present

## 2020-01-11 LAB — RENAL FUNCTION PANEL
Albumin: 2 g/dL — ABNORMAL LOW (ref 3.5–5.0)
Anion gap: 8 (ref 5–15)
BUN: 21 mg/dL (ref 8–23)
CO2: 27 mmol/L (ref 22–32)
Calcium: 8.5 mg/dL — ABNORMAL LOW (ref 8.9–10.3)
Chloride: 100 mmol/L (ref 98–111)
Creatinine, Ser: 1.33 mg/dL — ABNORMAL HIGH (ref 0.61–1.24)
GFR, Estimated: 55 mL/min — ABNORMAL LOW (ref >60)
Glucose, Bld: 95 mg/dL (ref 70–99)
Phosphorus: 2.6 mg/dL (ref 2.5–4.6)
Potassium: 4.7 mmol/L (ref 3.5–5.1)
Sodium: 135 mmol/L (ref 135–145)

## 2020-01-11 LAB — CBC
HCT: 33.3 % — ABNORMAL LOW (ref 39.0–52.0)
Hemoglobin: 10.6 g/dL — ABNORMAL LOW (ref 13.0–17.0)
MCH: 26 pg (ref 26.0–34.0)
MCHC: 31.8 g/dL (ref 30.0–36.0)
MCV: 81.8 fL (ref 80.0–100.0)
Platelets: 428 10*3/uL — ABNORMAL HIGH (ref 150–400)
RBC: 4.07 MIL/uL — ABNORMAL LOW (ref 4.22–5.81)
RDW: 13.4 % (ref 11.5–15.5)
WBC: 14.6 10*3/uL — ABNORMAL HIGH (ref 4.0–10.5)
nRBC: 0 % (ref 0.0–0.2)

## 2020-01-11 LAB — MAGNESIUM: Magnesium: 1.8 mg/dL (ref 1.7–2.4)

## 2020-01-11 LAB — GLUCOSE, CAPILLARY: Glucose-Capillary: 86 mg/dL (ref 70–99)

## 2020-01-11 LAB — PROCALCITONIN: Procalcitonin: <0.1 ng/mL

## 2020-01-11 LAB — C-REACTIVE PROTEIN: CRP: 2.3 mg/dL — ABNORMAL HIGH (ref <1.0)

## 2020-01-11 NOTE — Consult Note (Signed)
[facility] for Infectious Disease    Date of Admission:  01/02/2020     Reason for Consult: empyema    Referring Provider: Cyndia Skeeters      Abx: 12/02-c piptazo  11/25-12/02 azith/ceftriaxone        Assessment: 79 yo male nursing home resident admitted 11/25 for right lobar pna complicated by empyema, now s/p chest tube  CTS consulted and patient not surgical candidate for VATS due to other morbidities 11/25 initial bcx negative 12/03 pleural fluid cx negative  Patient clinically stable otherwise, with resolution sepsis (and ams), although wbc still high, serial chest xray showing stable loculated right pleural effusion, and chest tube still moderate output. I have reviewed cts' note and the plan is to discharge with chest tube, and follow up outpatient  Given current situation, reasonable to continue with antibiotics. He has risk of worsening of disease as source control remains the question. If progression on abx, will need surgery input again  Plan: 1. I will change antibiotics to ceftriaxone iv and metronidazole po, to avoid volume/salt load with piptazo 2. On discharge can change iv ceftriaxone/metronidazole to amoxicillin-clavulonic acid 875 mg po bid 3. The duration is difficult to assess at this time, and will depend on clinical improvement/resolution of the empyema, but can plan for 6 weeks abx starting 12/03 4. Will trend crp/procalcitonin x2 days here   Active Problems:   Empyema of lung (HCC)   Scheduled Meds: . aspirin EC  81 mg Oral Daily  . clopidogrel  75 mg Oral Daily  . DULoxetine  60 mg Oral Daily  . enoxaparin (LOVENOX) injection  40 mg Subcutaneous Daily  . feeding supplement  237 mL Oral TID BM  . multivitamin with minerals  1 tablet Oral Daily  . rosuvastatin  10 mg Oral Daily   Continuous Infusions: . piperacillin-tazobactam (ZOSYN)  IV 3.375 g (01/11/20 1439)   PRN Meds:.acetaminophen, ondansetron (ZOFRAN) IV  HPI: [name] [name] is a 79 y.o. 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 There was some concern of the technicality and IR couldn't put a chest tube  At [facility], cts seen, recommended chest tube placement S/p chest tube 12/3 by IR  piptazo since 12/2 11/25 bcx negative 12/2 ct scan loculated moderate pleural effusion not able to perform thoracentesis 12/03 pleural fluid cx negative (in setting of a week ceftriaxone/azith) No fever this admission at [facility] Wbc hovering 13-16   He is fairly debilitated Today able to tell me where he is and denies sob/chest pain/cough, n/v/diarrhea, rash  The chest tube is putting out moderate serosanguinous fluid still. There is no air leak. It is connected to wall suction    Review of Systems: ROS Negative 11 point ros unless mentioned above  Past Medical History:  Diagnosis Date  . Back pain   . Renal disorder    kidney stones  . Spinal stenosis, lumbar region, with neurogenic claudication 07/07/2014    Social History   Tobacco Use  . Smoking status: Current Every Day Smoker    Packs/day: 0.75    Types: Cigarettes  . Smokeless tobacco: Never Used  Vaping Use  . Vaping  Use: Never used  Substance Use Topics  . Alcohol use: No  . Drug use: Never    Family History  Problem Relation Age of Onset  . Diabetes Mother   . Diabetes Brother   . Diabetes Sister    No Known Allergies  OBJECTIVE: Blood pressure (!) 155/73, pulse 66, temperature 98.2 F (36.8 C), temperature source Oral, resp. rate 16, height 5\' 10"  (1.778 m), weight 66.8 kg, SpO2 99 %.  Physical Exam Thin male, no distress, lying in bed Heent: normocephalic; per;  conj clear; eomi Neck supple cv rrr no mrg Lungs decreased bs at right base; normal respiratory effort otherwise; chest tube intact; dressing over chest tube clean/dry abd s/nt Ext no edema Skin no rash msk no synovitis Neuro: cn2-12 intact; strength 4-5/5 in ext symmetric; no clonus/tremor Psych alert/oriented  Lab Results Lab Results  Component Value Date   WBC 14.6 (H) 01/11/2020   HGB 10.6 (L) 01/11/2020   HCT 33.3 (L) 01/11/2020   MCV 81.8 01/11/2020   PLT 428 (H) 01/11/2020    Lab Results  Component Value Date   CREATININE 1.33 (H) 01/11/2020   BUN 21 01/11/2020   NA 135 01/11/2020   K 4.7 01/11/2020   CL 100 01/11/2020   CO2 27 01/11/2020    Lab Results  Component Value Date   ALT 19 01/03/2020   AST 24 01/03/2020   ALKPHOS 65 01/03/2020   BILITOT 0.8 01/03/2020     Microbiology: Recent Results (from the past 240 hour(s))  MRSA PCR Screening     Status: None   Collection Time: 01/02/20  6:38 AM   Specimen: Nasal Mucosa; Nasopharyngeal  Result Value Ref Range Status   MRSA by PCR NEGATIVE NEGATIVE Final    Comment:        The GeneXpert MRSA Assay (FDA approved for NASAL specimens only), is one component of a comprehensive MRSA colonization surveillance program. It is not intended to diagnose MRSA infection nor to guide or monitor treatment for MRSA infections. Performed at [facility], [place] [address], [place], [place] [address]   Aerobic/Anaerobic Culture (surgical/deep wound)     Status: None   Collection Time: 01/03/20  4:19 PM   Specimen: Abscess  Result Value Ref Range Status   Specimen Description ABSCESS  Final   Special Requests RIGHT PLEURAL  Final   Gram Stain   Final    RARE WBC PRESENT, PREDOMINANTLY PMN NO ORGANISMS SEEN    Culture   Final    No growth aerobically or anaerobically. Performed at [facility], [place] [address] [place], [place] [address]    Report Status 01/08/2020 FINAL  Final    Jabier Mutton,  [facility] for Infectious [place] [phone] pager    01/11/2020, 3:45 PM

## 2020-01-11 NOTE — Progress Notes (Signed)
      [address]       [place] [address]             [phone]    Drain put out 120cc/24 hours. Will continue drain. Plan below.    Per Dr. Darcey Nora:  Keep CT to suction and when drainage is <42ml/day transition to MiniExpress collection system which can be managed at the SNF and followed in our office as outpatient.   Nicholes Rough, PA-C

## 2020-01-11 NOTE — Progress Notes (Signed)
PROGRESS NOTE  [name] [name] OAC:[id number] DOB: Aug 07, 1940   PCP: [name], [name], MD  Patient is from: [facility] SN[facility]  DOA: 01/02/2020 LOS: 74  Chief complaints: Confusion and shortness of breath  Brief Narrative / Interim history: 79 year old male with history of recent CVA in 11/2019 with residual dysarthria, HTN, HLD and spinal stenosis initially presented to [facility] with confusion and difficulty breathing.  Work-up revealed right-sided loculated pleural effusion with concern for empyema.  IR was not able to place chest tube at [facility] due to DVT [place] of pleural fluid.  He was evaluated by general surgery who recommended transfer to [facility] for CTS evaluation.  Patient was evaluated by CTS who recommended chest tube placement by IR, that was performed on 01/03/2020.  He is currently on IV Zosyn with significant output from chest tube.  Plan to transition to water seal using mini express system when drainage < 50cc/day and d/c to SNF.  He is a still on IV Zosyn.  ID consulted for help with antibiotics  Subjective: Seen and examined earlier this morning.  No major events overnight of this morning.  No complaints but not a reliable historian.  He is awake and oriented to self and place.  Responds no to pain or trouble breathing.  He is now on room air.  Objective: Vitals:   01/10/20 1355 01/10/20 1446 01/10/20 1941 01/11/20 0444  BP: 112/65 117/61 115/62 118/60  Pulse: 79 75 80 79  Resp:  16 16 16   Temp: 98 F (36.7 C) (!) 97.3 F (36.3 C) 97.8 F (36.6 C) 97.9 F (36.6 C)  TempSrc: Oral Oral Oral Oral  SpO2: 97% 98% 98% 99%  Weight:      Height:        Intake/Output Summary (Last 24 hours) at 01/11/2020 1321 Last data filed at 01/11/2020 0954 Gross per 24 hour  Intake 237 ml  Output 970 ml  Net -733 ml   Filed Weights   01/03/20 1414  Weight: 66.8 kg    Examination:  GENERAL: Frail looking elderly male. HEENT: MMM.  Vision and hearing grossly intact.   NECK: Supple.  No apparent JVD.  RESP: On RA.  No IWOB.  Fair aeration bilaterally.  Chest tube to right chest. CVS:  RRR. Heart sounds normal.  ABD/GI/GU: BS+. Abd soft, NTND.  MSK/EXT: Significant muscle mass and subcu fat loss.  BLE weakness. SKIN: no apparent skin lesion or wound NEURO: Awake.  Oriented to self and place.  No focal deficit other than generalized weakness PSYCH: Calm.  No distress or agitation.  Procedures:  12/3-pigtail chest tube placement by IR  Microbiology summarized: COVID-19 and influenza PCR nonreactive MRSA PCR nonreactive Pleural fluid culture NGTD  Assessment & Plan: Right-sided parapneumonic effusion/empyema -Pigtail chest tube to right chest on 12/3.  Chest tube output 120 cc / 24 hours. -Not a candidate for VATS surgery with severe malnutrition, frailty and comorbidities. -Plan to transition to water seal using mini express system when drainage < 50cc/day and d/c to SNF -Culture data as above. -Rocephin>> Zosyn 12/2>>> leukocytosis improving.  ID consulted. -CVTS following.  Acute hypoxemic respiratory failure: Intermittently desaturated to 70s and 80s when attempted to wean his oxygen.  Now on room air. -Incentive spirometry/OOB/PT/OT  Renal insufficiency: Cr stable at 1.3. Doesn't meet criteria for AKI or CKD yet.  Excellent urine output, 3 L / 24 hours.  Losartan discontinued on 12/10.  He is still on Zosyn. -Avoid/minimize nephrotoxic meds -Continue monitoring  History  of CVA in October 2021 with residual dysarthria: Stable. -Continue aspirin, Plavix, Crestor  Essential hypertension: Normotensive off medications. -Losartan discontinued on 12/10.  Anxiety/depression: Stable. -Continue Cymbalta  Dementia without behavioral disturbance: Stable.  Oriented x2 -Reorientation and delirium precautions.  Debility/physical deconditioning -PT/OT recommended SNF  Leukocytosis/bandemia: Due to #1.  WBC slightly up today. -Continue  monitoring  Severe malnutrition as evidenced by low BMI and significant muscle mass and subcu fat loss Body mass index is 21.13 kg/m. Nutrition Problem: Severe Malnutrition Etiology: chronic illness (CVA) Signs/Symptoms: moderate fat depletion,severe fat depletion,moderate muscle depletion,severe muscle depletion Interventions: Ensure Enlive (each supplement provides 350kcal and 20 grams of protein),Magic cup,MVI   DVT prophylaxis:  enoxaparin (LOVENOX) injection 40 mg Start: 01/03/20 1000  Code Status: Full code Family Communication: Patient and/or RN. Available if any question.  Status is: Inpatient  Remains inpatient appropriate because:Unsafe d/c plan, IV treatments appropriate due to intensity of illness or inability to take PO and Inpatient level of care appropriate due to severity of illness   Dispo:  Patient From: [facility]  Planned Disposition: [place]  Expected discharge date: 01/13/2020  Medically stable for discharge: No        Consultants:  CVTS-following IR-signed off Infectious disease-consulted   Sch Meds:  Scheduled Meds: . aspirin EC  81 mg Oral Daily  . clopidogrel  75 mg Oral Daily  . DULoxetine  60 mg Oral Daily  . enoxaparin (LOVENOX) injection  40 mg Subcutaneous Daily  . feeding supplement  237 mL Oral TID BM  . multivitamin with minerals  1 tablet Oral Daily  . rosuvastatin  10 mg Oral Daily   Continuous Infusions: . piperacillin-tazobactam (ZOSYN)  IV 3.375 g (01/11/20 0528)   PRN Meds:.acetaminophen, ondansetron (ZOFRAN) IV  Antimicrobials: Anti-infectives (From admission, onward)   Start     Dose/Rate Route Frequency Ordered Stop   01/02/20 2330  piperacillin-tazobactam (ZOSYN) IVPB 3.375 g        3.375 g 12.5 mL/hr over 240 Minutes Intravenous Every 8 hours 01/02/20 2242         I have personally reviewed the following labs and images: CBC: Recent Labs  Lab 01/06/20 0412 01/07/20 0429  01/08/20 0016 01/09/20 0443 01/11/20 0340  WBC 16.4* 12.5* 13.0* 13.0* 14.6*  NEUTROABS  --   --   --  9.7*  --   HGB 10.1* 10.1* 9.9* 10.5* 10.6*  HCT 31.6* 33.6* 33.1* 32.9* 33.3*  MCV 82.1 83.8 83.6 81.2 81.8  PLT 403* 396 417* 404* 428*   BMP &GFR Recent Labs  Lab 01/08/20 0016 01/09/20 0443 01/10/20 0049 01/11/20 0340  NA 135  --  137 135  K 4.6 4.6 4.5 4.7  CL 100  --  101 100  CO2 28  --  27 27  GLUCOSE 102*  --  109* 95  BUN 19  --  21 21  CREATININE 1.21 1.31* 1.34* 1.33*  CALCIUM 8.3*  --  8.2* 8.5*  MG  --  1.8 1.8 1.8  PHOS  --  2.9 3.1 2.6   Estimated Creatinine Clearance: 43.2 mL/min (A) (by C-G formula based on SCr of 1.33 mg/dL (H)). Liver & Pancreas: Recent Labs  Lab 01/10/20 0049 01/11/20 0340  ALBUMIN 2.0* 2.0*   No results for input(s): LIPASE, AMYLASE in the last 168 hours. No results for input(s): AMMONIA in the last 168 hours. Diabetic: No results for input(s): HGBA1C in the last 72 hours. Recent Labs  Lab 01/11/20 [phone]  GLUCAP 86   Cardiac Enzymes: No results for input(s): CKTOTAL, CKMB, CKMBINDEX, TROPONINI in the last 168 hours. No results for input(s): PROBNP in the last 8760 hours. Coagulation Profile: No results for input(s): INR, PROTIME in the last 168 hours. Thyroid Function Tests: No results for input(s): TSH, T4TOTAL, FREET4, T3FREE, THYROIDAB in the last 72 hours. Lipid Profile: No results for input(s): CHOL, HDL, LDLCALC, TRIG, CHOLHDL, LDLDIRECT in the last 72 hours. Anemia Panel: No results for input(s): VITAMINB12, FOLATE, FERRITIN, TIBC, IRON, RETICCTPCT in the last 72 hours. Urine analysis:    Component Value Date/Time   COLORURINE YELLOW (A) 12/29/2019 1127   APPEARANCEUR HAZY (A) 12/29/2019 1127   APPEARANCEUR Cloudy 01/02/2012 1326   LABSPEC 1.019 12/29/2019 1127   LABSPEC 1.017 01/02/2012 1326   PHURINE 5.0 12/29/2019 1127   GLUCOSEU NEGATIVE 12/29/2019 1127   GLUCOSEU NEGATIVE 09/26/2017 1104   HGBUR  NEGATIVE 12/29/2019 [place] 12/29/2019 1127   BILIRUBINUR Negative 01/02/2012 [place] 12/29/2019 1127   PROTEINUR NEGATIVE 12/29/2019 1127   UROBILINOGEN 0.2 09/26/2017 1104   NITRITE NEGATIVE 12/29/2019 1127   LEUKOCYTESUR MODERATE (A) 12/29/2019 1127   LEUKOCYTESUR 3+ 01/02/2012 1326   Sepsis Labs: Invalid input(s): PROCALCITONIN, [place]  Microbiology: Recent Results (from the past 240 hour(s))  MRSA PCR Screening     Status: None   Collection Time: 01/02/20  6:38 AM   Specimen: Nasal Mucosa; Nasopharyngeal  Result Value Ref Range Status   MRSA by PCR NEGATIVE NEGATIVE Final    Comment:        The GeneXpert MRSA Assay (FDA approved for NASAL specimens only), is one component of a comprehensive MRSA colonization surveillance program. It is not intended to diagnose MRSA infection nor to guide or monitor treatment for MRSA infections. Performed at [facility], [place] [address] [place], [place] [address]   Aerobic/Anaerobic Culture (surgical/deep wound)     Status: None   Collection Time: 01/03/20  4:19 PM   Specimen: Abscess  Result Value Ref Range Status   Specimen Description ABSCESS  Final   Special Requests RIGHT PLEURAL  Final   Gram Stain   Final    RARE WBC PRESENT, PREDOMINANTLY PMN NO ORGANISMS SEEN    Culture   Final    No growth aerobically or anaerobically. Performed at [facility], [place] [address] [place], [place] [address]    Report Status 01/08/2020 FINAL  Final    Radiology Studies: No results found.   Geisha Abernathy T. [place]  If 7PM-7AM, please contact night-coverage [email or web address] 01/11/2020, 1:21 PM

## 2020-01-11 NOTE — Plan of Care (Signed)
  Problem: Education: Goal: Knowledge of General Education information will improve Description: Including pain rating scale, medication(s)/side effects and non-pharmacologic comfort measures 01/11/2020 1134 by Racheal Patches, RN Outcome: Progressing 01/11/2020 1134 by Racheal Patches, RN Outcome: Progressing   Problem: Clinical Measurements: Goal: Ability to maintain clinical measurements within normal limits will improve 01/11/2020 1134 by Racheal Patches, RN Outcome: Progressing 01/11/2020 1134 by Racheal Patches, RN Outcome: Progressing Goal: Respiratory complications will improve 01/11/2020 1134 by Racheal Patches, RN Outcome: Progressing 01/11/2020 1134 by Racheal Patches, RN Outcome: Progressing   Problem: Pain Managment: Goal: General experience of comfort will improve 01/11/2020 1134 by Racheal Patches, RN Outcome: Progressing 01/11/2020 1134 by Racheal Patches, RN Outcome: Progressing   Problem: Skin Integrity: Goal: Risk for impaired skin integrity will decrease 01/11/2020 1134 by Racheal Patches, RN Outcome: Progressing 01/11/2020 1134 by Racheal Patches, RN Outcome: Progressing

## 2020-01-12 DIAGNOSIS — A419 Sepsis, unspecified organism: Secondary | ICD-10-CM | POA: Diagnosis not present

## 2020-01-12 DIAGNOSIS — R5381 Other malaise: Secondary | ICD-10-CM | POA: Diagnosis not present

## 2020-01-12 DIAGNOSIS — R6520 Severe sepsis without septic shock: Secondary | ICD-10-CM | POA: Diagnosis not present

## 2020-01-12 DIAGNOSIS — Z8673 Personal history of transient ischemic attack (TIA), and cerebral infarction without residual deficits: Secondary | ICD-10-CM | POA: Diagnosis not present

## 2020-01-12 DIAGNOSIS — N289 Disorder of kidney and ureter, unspecified: Secondary | ICD-10-CM | POA: Diagnosis not present

## 2020-01-12 DIAGNOSIS — F0390 Unspecified dementia without behavioral disturbance: Secondary | ICD-10-CM | POA: Diagnosis not present

## 2020-01-12 DIAGNOSIS — I10 Essential (primary) hypertension: Secondary | ICD-10-CM | POA: Diagnosis not present

## 2020-01-12 DIAGNOSIS — J869 Pyothorax without fistula: Secondary | ICD-10-CM | POA: Diagnosis not present

## 2020-01-12 DIAGNOSIS — E43 Unspecified severe protein-calorie malnutrition: Secondary | ICD-10-CM | POA: Diagnosis not present

## 2020-01-12 DIAGNOSIS — J189 Pneumonia, unspecified organism: Secondary | ICD-10-CM | POA: Diagnosis not present

## 2020-01-12 DIAGNOSIS — J181 Lobar pneumonia, unspecified organism: Secondary | ICD-10-CM | POA: Diagnosis not present

## 2020-01-12 DIAGNOSIS — R471 Dysarthria and anarthria: Secondary | ICD-10-CM | POA: Diagnosis not present

## 2020-01-12 LAB — C-REACTIVE PROTEIN: CRP: 2.4 mg/dL — ABNORMAL HIGH (ref <1.0)

## 2020-01-12 LAB — PROCALCITONIN: Procalcitonin: <0.1 ng/mL

## 2020-01-12 MED ORDER — SODIUM CHLORIDE 0.9 % IV SOLN
2.0000 g | INTRAVENOUS | Status: DC
  Administered 2020-01-12 – 2020-02-05 (×25): 2 g via INTRAVENOUS
  Filled 2020-01-12 (×4): qty 20
  Filled 2020-01-12: qty 2
  Filled 2020-01-12 (×3): qty 20
  Filled 2020-01-12: qty 2
  Filled 2020-01-12: qty 20
  Filled 2020-01-12: qty 2
  Filled 2020-01-12 (×4): qty 20
  Filled 2020-01-12: qty 2
  Filled 2020-01-12 (×4): qty 20
  Filled 2020-01-12: qty 2
  Filled 2020-01-12: qty 20
  Filled 2020-01-12 (×2): qty 2
  Filled 2020-01-12 (×2): qty 20

## 2020-01-12 MED ORDER — METRONIDAZOLE 500 MG PO TABS
500.0000 mg | ORAL_TABLET | Freq: Three times a day (TID) | ORAL | Status: DC
  Administered 2020-01-12 – 2020-02-05 (×73): 500 mg via ORAL
  Filled 2020-01-12 (×73): qty 1

## 2020-01-12 NOTE — Progress Notes (Signed)
[facility] for Infectious Disease  Date of Admission:  01/02/2020      Abx: 12/02-c piptazo  11/25-12/02 azith/ceftriaxone                                                              Assessment: 79 yo male nursing home resident admitted 11/25 for right lobar pna complicated by empyema, now s/p chest tube  CTS consulted and patient not surgical candidate for VATS due to other morbidities 11/25 initial bcx negative 12/03 pleural fluid cx negative  Patient clinically stable otherwise, with resolution sepsis (and ams), although wbc still high, serial chest xray showing stable loculated right pleural effusion, and chest tube still moderate output. I have reviewed cts' note and the plan is to discharge with chest tube, and follow up outpatient  Given current situation, reasonable to continue with antibiotics. He has risk of worsening of disease as source control remains the question. If progression on abx, will need surgery input again   -------- 12/12 assessment procalcitonin <0.3 x2 Although concern regarding failure of tx with this complex/loculated effusion remains, it is reasonable to transition to oral abx on discharge with close f/u, if no inpatient surgical intervention is planned  Plan: 1. Can transition to oral augmentin 875 mg po bid on discharge; plan 6 weeks abx starting 12/03 2. Close f/u with CTS outpatient 3. ID f/u as below; will sign off    Appointment date and time: 02/14/2020 @ 1030 am   Labs Monitor (check all applied): __ weekly cbc, cmp, crp __ weekly vancomycin __ trough or __ predialysis level __ other TDM   [facility] address: [place] for Infectious Disease Located in: [facility] Address: [place], [place],  [address] Phone: [phone]  Active Problems:   Empyema of lung ([place])   Severe sepsis without septic shock ([place])   Scheduled Meds: . aspirin EC  81 mg Oral Daily  .  clopidogrel  75 mg Oral Daily  . DULoxetine  60 mg Oral Daily  . enoxaparin (LOVENOX) injection  40 mg Subcutaneous Daily  . feeding supplement  237 mL Oral TID BM  . metroNIDAZOLE  500 mg Oral Q8H  . multivitamin with minerals  1 tablet Oral Daily  . rosuvastatin  10 mg Oral Daily   Continuous Infusions: . cefTRIAXone (ROCEPHIN)  IV     PRN Meds:.acetaminophen, ondansetron (ZOFRAN) IV   SUBJECTIVE: No event over night No chest pain, nausea, vomiting No fever, chill  Review of Systems: ROS Negative 11 point ros unless mentioned above  No Known Allergies  OBJECTIVE: Vitals:   01/11/20 1414 01/11/20 1921 01/12/20 0300 01/12/20 1022  BP: (!) 155/73 115/61 117/71 113/63  Pulse: 66 70 73 74  Resp: 16 16 17 15   Temp: 98.2 F (36.8 C) 98 F (36.7 C) 97.6 F (36.4 C) (!) 97.3 F (36.3 C)  TempSrc: Oral Oral Oral Oral  SpO2: 99% 99% 97% 96%  Weight:      Height:       Body mass index is 21.13 kg/m.  Physical Exam Chronically ill appearing No distress Heent: normocephalic; conj clear; per/eomi Neck supple cv rrr no mrg Lungs decreased right basilar breath sound; chest tube intact abd s/nt Ext  no edema Neuro generalized weakness, nonfocal  Lab Results Lab Results  Component Value Date   WBC 14.6 (H) 01/11/2020   HGB 10.6 (L) 01/11/2020   HCT 33.3 (L) 01/11/2020   MCV 81.8 01/11/2020   PLT 428 (H) 01/11/2020    Lab Results  Component Value Date   CREATININE 1.33 (H) 01/11/2020   BUN 21 01/11/2020   NA 135 01/11/2020   K 4.7 01/11/2020   CL 100 01/11/2020   CO2 27 01/11/2020    Lab Results  Component Value Date   ALT 19 01/03/2020   AST 24 01/03/2020   ALKPHOS 65 01/03/2020   BILITOT 0.8 01/03/2020     Microbiology: Recent Results (from the past 240 hour(s))  Aerobic/Anaerobic Culture (surgical/deep wound)     Status: None   Collection Time: 01/03/20  4:19 PM   Specimen: Abscess  Result Value Ref Range Status   Specimen Description ABSCESS   Final   Special Requests RIGHT PLEURAL  Final   Gram Stain   Final    RARE WBC PRESENT, PREDOMINANTLY PMN NO ORGANISMS SEEN    Culture   Final    No growth aerobically or anaerobically. Performed at [facility], [place] [address], [place], [place] [address]    Report Status 01/08/2020 FINAL  Final    Serology:  12/11 and 12/12 procalcitonin 0.1   Jabier Mutton, [facility] for Infectious [place] [phone] pager    01/12/2020, 11:23 AM

## 2020-01-12 NOTE — Progress Notes (Signed)
PROGRESS NOTE  [name] [name] MVH:[id number] DOB: July 15, 1940   PCP: [name], [name], MD  Patient is from: [facility]  DOA: 01/02/2020 LOS: 37  Chief complaints: Confusion and shortness of breath  Brief Narrative / Interim history: 79 year old male with history of recent CVA in 11/2019 with residual dysarthria, HTN, HLD and spinal stenosis initially presented to [facility] with confusion and difficulty breathing.  Work-up revealed right-sided loculated pleural effusion with concern for empyema.  IR was not able to place chest tube at [facility] due to DVT Curnes of pleural fluid.  He was evaluated by general surgery who recommended transfer to [facility] for CTS evaluation.  Patient was evaluated by CTS who recommended chest tube placement by IR, that was performed on 01/03/2020.  He is currently on IV Zosyn with significant output from chest tube.  Plan to transition to water seal using mini express system when drainage < 50cc/day and d/c to SNF.  He is a still on IV Zosyn.  ID consulted and recommended transitioning to ceftriaxone and p.o. Flagyl while in house and discharged on p.o. Augmentin for a total of 6 weeks starting 01/03/2020.  Subjective: Seen and examined earlier this morning.  No major events overnight of this morning.  No complaints but not a great historian.  He is only oriented x2.  Objective: Vitals:   01/11/20 1414 01/11/20 1921 01/12/20 0300 01/12/20 1022  BP: (!) 155/73 115/61 117/71 113/63  Pulse: 66 70 73 74  Resp: 16 16 17 15   Temp: 98.2 F (36.8 C) 98 F (36.7 C) 97.6 F (36.4 C) (!) 97.3 F (36.3 C)  TempSrc: Oral Oral Oral Oral  SpO2: 99% 99% 97% 96%  Weight:      Height:        Intake/Output Summary (Last 24 hours) at 01/12/2020 1306 Last data filed at 01/12/2020 0829 Gross per 24 hour  Intake 819.94 ml  Output 1200 ml  Net -380.06 ml   Filed Weights   01/03/20 1414  Weight: 66.8 kg    Examination:  GENERAL: No apparent distress.   Nontoxic. HEENT: MMM.  Vision and hearing grossly intact.  NECK: Supple.  No apparent JVD.  RESP: On RA.  No IWOB.  Fair aeration bilaterally.  Chest tube to right chest. CVS:  RRR. Heart sounds normal.  ABD/GI/GU: BS+. Abd soft, NTND.  MSK/EXT:  Moves extremities. No apparent deformity. No edema.  SKIN: no apparent skin lesion or wound NEURO: Awake.  Oriented x2.  Follows commands.  No apparent focal neuro deficit. PSYCH: Calm. Normal affect.   Procedures:  12/3-pigtail chest tube placement by IR  Microbiology summarized: COVID-19 and influenza PCR nonreactive MRSA PCR nonreactive Pleural fluid culture NGTD  Assessment & Plan: Right-sided parapneumonic effusion/empyema -Pigtail chest tube to right chest on 12/3.  Chest tube output 200 cc/ 24 hours. -Not a candidate for VATS surgery with severe malnutrition, frailty and comorbidities. -Plan to transition to water seal using mini express system when drainage < 50cc/day and d/c to SNF -Culture data as above.  Procalcitonin negative.  CRP 2.4 -Rocephin + Azithro>> Zosyn 12/2>>> CTX + PO Flagyl per ID on 12/12>> -Per ID, p.o. Augmentin on discharge for a total of 6 weeks starting 01/03/2020 -CVTS following.  Acute hypoxemic respiratory failure: Intermittently desaturated to 70s and 80s when attempted to wean his oxygen.  Now on room air. -Incentive spirometry/OOB/PT/OT  Renal insufficiency: Cr stable at 1.3. Doesn't meet criteria for AKI or CKD yet.  Good urine output.  Losartan discontinued on 12/10.  Zosyn changed to ceftriaxone and Flagyl. -Avoid/minimize nephrotoxic meds -Continue monitoring  History of CVA in October 2021 with residual dysarthria: Stable. -Continue aspirin, Plavix, Crestor  Essential hypertension: Normotensive off medications. -Losartan discontinued on 12/10.  Anxiety/depression: Stable. -Continue Cymbalta  Dementia without behavioral disturbance: Stable.  Oriented x2 -Reorientation and delirium  precautions.  Debility/physical deconditioning -PT/OT recommended SNF  Leukocytosis/bandemia: Due to #1.  WBC slightly up today. -Continue monitoring  Severe malnutrition as evidenced by low BMI and significant muscle mass and subcu fat loss Body mass index is 21.13 kg/m. Nutrition Problem: Severe Malnutrition Etiology: chronic illness (CVA) Signs/Symptoms: moderate fat depletion,severe fat depletion,moderate muscle depletion,severe muscle depletion Interventions: Ensure Enlive (each supplement provides 350kcal and 20 grams of protein),Magic cup,MVI   DVT prophylaxis:  enoxaparin (LOVENOX) injection 40 mg Start: 01/03/20 1000  Code Status: Full code Family Communication: Patient and/or RN. Available if any question.  Status is: Inpatient  Remains inpatient appropriate because:Unsafe d/c plan, IV treatments appropriate due to intensity of illness or inability to take PO and Inpatient level of care appropriate due to severity of illness   Dispo:  Patient From: [place]  Planned Disposition: [place]  Expected discharge date: 01/13/2020  Medically stable for discharge: No        Consultants:  CVTS-following IR-signed off Infectious disease-signed off.   Sch Meds:  Scheduled Meds: . aspirin EC  81 mg Oral Daily  . clopidogrel  75 mg Oral Daily  . DULoxetine  60 mg Oral Daily  . enoxaparin (LOVENOX) injection  40 mg Subcutaneous Daily  . feeding supplement  237 mL Oral TID BM  . metroNIDAZOLE  500 mg Oral Q8H  . multivitamin with minerals  1 tablet Oral Daily  . rosuvastatin  10 mg Oral Daily   Continuous Infusions: . cefTRIAXone (ROCEPHIN)  IV     PRN Meds:.acetaminophen, ondansetron (ZOFRAN) IV  Antimicrobials: Anti-infectives (From admission, onward)   Start     Dose/Rate Route Frequency Ordered Stop   01/12/20 1400  metroNIDAZOLE (FLAGYL) tablet 500 mg        500 mg Oral Every 8 hours 01/12/20 1111     01/12/20 1300   cefTRIAXone (ROCEPHIN) 2 g in sodium chloride 0.9 % 100 mL IVPB        2 g 200 mL/hr over 30 Minutes Intravenous Every 24 hours 01/12/20 1111     01/02/20 2330  piperacillin-tazobactam (ZOSYN) IVPB 3.375 g  Status:  Discontinued        3.375 g 12.5 mL/hr over 240 Minutes Intravenous Every 8 hours 01/02/20 2242 01/12/20 1111       I have personally reviewed the following labs and images: CBC: Recent Labs  Lab 01/06/20 0412 01/07/20 0429 01/08/20 0016 01/09/20 0443 01/11/20 0340  WBC 16.4* 12.5* 13.0* 13.0* 14.6*  NEUTROABS  --   --   --  9.7*  --   HGB 10.1* 10.1* 9.9* 10.5* 10.6*  HCT 31.6* 33.6* 33.1* 32.9* 33.3*  MCV 82.1 83.8 83.6 81.2 81.8  PLT 403* 396 417* 404* 428*   BMP &GFR Recent Labs  Lab 01/08/20 0016 01/09/20 0443 01/10/20 0049 01/11/20 0340  NA 135  --  137 135  K 4.6 4.6 4.5 4.7  CL 100  --  101 100  CO2 28  --  27 27  GLUCOSE 102*  --  109* 95  BUN 19  --  21 21  CREATININE 1.21 1.31* 1.34* 1.33*  CALCIUM  8.3*  --  8.2* 8.5*  MG  --  1.8 1.8 1.8  PHOS  --  2.9 3.1 2.6   Estimated Creatinine Clearance: 43.2 mL/min (A) (by C-G formula based on SCr of 1.33 mg/dL (H)). Liver & Pancreas: Recent Labs  Lab 01/10/20 0049 01/11/20 0340  ALBUMIN 2.0* 2.0*   No results for input(s): LIPASE, AMYLASE in the last 168 hours. No results for input(s): AMMONIA in the last 168 hours. Diabetic: No results for input(s): HGBA1C in the last 72 hours. Recent Labs  Lab 01/11/20 0743  GLUCAP 86   Cardiac Enzymes: No results for input(s): CKTOTAL, CKMB, CKMBINDEX, TROPONINI in the last 168 hours. No results for input(s): PROBNP in the last 8760 hours. Coagulation Profile: No results for input(s): INR, PROTIME in the last 168 hours. Thyroid Function Tests: No results for input(s): TSH, T4TOTAL, FREET4, T3FREE, THYROIDAB in the last 72 hours. Lipid Profile: No results for input(s): CHOL, HDL, LDLCALC, TRIG, CHOLHDL, LDLDIRECT in the last 72 hours. Anemia  Panel: No results for input(s): VITAMINB12, FOLATE, FERRITIN, TIBC, IRON, RETICCTPCT in the last 72 hours. Urine analysis:    Component Value Date/Time   COLORURINE YELLOW (A) 12/29/2019 1127   APPEARANCEUR HAZY (A) 12/29/2019 1127   APPEARANCEUR Cloudy 01/02/2012 1326   LABSPEC 1.019 12/29/2019 1127   LABSPEC 1.017 01/02/2012 1326   PHURINE 5.0 12/29/2019 1127   GLUCOSEU NEGATIVE 12/29/2019 1127   GLUCOSEU NEGATIVE 09/26/2017 1104   HGBUR NEGATIVE 12/29/2019 [place] 12/29/2019 1127   BILIRUBINUR Negative 01/02/2012 [place] 12/29/2019 1127   PROTEINUR NEGATIVE 12/29/2019 1127   UROBILINOGEN 0.2 09/26/2017 1104   NITRITE NEGATIVE 12/29/2019 1127   LEUKOCYTESUR MODERATE (A) 12/29/2019 1127   LEUKOCYTESUR 3+ 01/02/2012 1326   Sepsis Labs: Invalid input(s): PROCALCITONIN, [place]  Microbiology: Recent Results (from the past 240 hour(s))  Aerobic/Anaerobic Culture (surgical/deep wound)     Status: None   Collection Time: 01/03/20  4:19 PM   Specimen: Abscess  Result Value Ref Range Status   Specimen Description ABSCESS  Final   Special Requests RIGHT PLEURAL  Final   Gram Stain   Final    RARE WBC PRESENT, PREDOMINANTLY PMN NO ORGANISMS SEEN    Culture   Final    No growth aerobically or anaerobically. Performed at [facility], [place] [address], [place], [place] [address]    Report Status 01/08/2020 FINAL  Final    Radiology Studies: No results found.   [name] [name] T. [place]  If 7PM-7AM, please contact night-coverage [email or web address] 01/12/2020, 1:06 PM

## 2020-01-13 DIAGNOSIS — Z8673 Personal history of transient ischemic attack (TIA), and cerebral infarction without residual deficits: Secondary | ICD-10-CM | POA: Diagnosis not present

## 2020-01-13 DIAGNOSIS — R5381 Other malaise: Secondary | ICD-10-CM | POA: Diagnosis not present

## 2020-01-13 DIAGNOSIS — R471 Dysarthria and anarthria: Secondary | ICD-10-CM | POA: Diagnosis not present

## 2020-01-13 DIAGNOSIS — F0390 Unspecified dementia without behavioral disturbance: Secondary | ICD-10-CM | POA: Diagnosis not present

## 2020-01-13 DIAGNOSIS — Z66 Do not resuscitate: Secondary | ICD-10-CM

## 2020-01-13 DIAGNOSIS — E43 Unspecified severe protein-calorie malnutrition: Secondary | ICD-10-CM | POA: Diagnosis not present

## 2020-01-13 DIAGNOSIS — I10 Essential (primary) hypertension: Secondary | ICD-10-CM | POA: Diagnosis not present

## 2020-01-13 DIAGNOSIS — N289 Disorder of kidney and ureter, unspecified: Secondary | ICD-10-CM | POA: Diagnosis not present

## 2020-01-13 DIAGNOSIS — J869 Pyothorax without fistula: Secondary | ICD-10-CM | POA: Diagnosis not present

## 2020-01-13 DIAGNOSIS — R4182 Altered mental status, unspecified: Secondary | ICD-10-CM | POA: Diagnosis not present

## 2020-01-13 DIAGNOSIS — Z7189 Other specified counseling: Secondary | ICD-10-CM

## 2020-01-13 DIAGNOSIS — J189 Pneumonia, unspecified organism: Secondary | ICD-10-CM | POA: Diagnosis not present

## 2020-01-13 LAB — RENAL FUNCTION PANEL
Albumin: 2.1 g/dL — ABNORMAL LOW (ref 3.5–5.0)
Anion gap: 7 (ref 5–15)
BUN: 25 mg/dL — ABNORMAL HIGH (ref 8–23)
CO2: 25 mmol/L (ref 22–32)
Calcium: 8.6 mg/dL — ABNORMAL LOW (ref 8.9–10.3)
Chloride: 104 mmol/L (ref 98–111)
Creatinine, Ser: 1.31 mg/dL — ABNORMAL HIGH (ref 0.61–1.24)
GFR, Estimated: 56 mL/min — ABNORMAL LOW (ref >60)
Glucose, Bld: 109 mg/dL — ABNORMAL HIGH (ref 70–99)
Phosphorus: 3 mg/dL (ref 2.5–4.6)
Potassium: 4.3 mmol/L (ref 3.5–5.1)
Sodium: 136 mmol/L (ref 135–145)

## 2020-01-13 LAB — CBC WITH DIFFERENTIAL/PLATELET
Abs Immature Granulocytes: 0.1 10*3/uL — ABNORMAL HIGH (ref 0.00–0.07)
Basophils Absolute: 0.1 10*3/uL (ref 0.0–0.1)
Basophils Relative: 1 %
Eosinophils Absolute: 0.2 10*3/uL (ref 0.0–0.5)
Eosinophils Relative: 2 %
HCT: 34.9 % — ABNORMAL LOW (ref 39.0–52.0)
Hemoglobin: 10.8 g/dL — ABNORMAL LOW (ref 13.0–17.0)
Immature Granulocytes: 1 %
Lymphocytes Relative: 12 %
Lymphs Abs: 1.6 10*3/uL (ref 0.7–4.0)
MCH: 25.3 pg — ABNORMAL LOW (ref 26.0–34.0)
MCHC: 30.9 g/dL (ref 30.0–36.0)
MCV: 81.7 fL (ref 80.0–100.0)
Monocytes Absolute: 1.2 10*3/uL — ABNORMAL HIGH (ref 0.1–1.0)
Monocytes Relative: 9 %
Neutro Abs: 10 10*3/uL — ABNORMAL HIGH (ref 1.7–7.7)
Neutrophils Relative %: 75 %
Platelets: 408 10*3/uL — ABNORMAL HIGH (ref 150–400)
RBC: 4.27 MIL/uL (ref 4.22–5.81)
RDW: 13.8 % (ref 11.5–15.5)
WBC: 13.2 10*3/uL — ABNORMAL HIGH (ref 4.0–10.5)
nRBC: 0 % (ref 0.0–0.2)

## 2020-01-13 LAB — MAGNESIUM: Magnesium: 1.7 mg/dL (ref 1.7–2.4)

## 2020-01-13 LAB — C-REACTIVE PROTEIN: CRP: 2.2 mg/dL — ABNORMAL HIGH (ref <1.0)

## 2020-01-13 LAB — PROCALCITONIN: Procalcitonin: <0.1 ng/mL

## 2020-01-13 NOTE — Progress Notes (Signed)
Referring Physician(s): [facility] Dr Bettina Gavia ID Dr Darnelle Maffucci Dr Prescott Gum   Supervising Physician: Corrie Mckusick  Patient Status:  [facility] - In-pt  Chief Complaint:  Right empyema chest tube placed in IR 12/3  Subjective:  Up in bed - eating with help Communicating well this am Denies pain or issues  CXR pending report this am  12/11 TCTS note:  Drain put out 120cc/24 hours. Will continue drain. Plan below.          Per Dr. Darcey Nora: Keep CT to suction and when drainage is <75ml/day transition to MiniExpress collection system which can be managed at the SNF and followed in our office as outpatient.    Allergies: Patient has no known allergies.  Medications: Prior to Admission medications   Medication Sig Start Date End Date Taking? Authorizing Provider  cefTRIAXone 2 g in sodium chloride 0.9 % 100 mL Inject 2 g into the vein daily. 01/02/20  Yes [name], MD  DULoxetine (CYMBALTA) 60 MG capsule Take 1 capsule (60 mg total) by mouth daily. 10/15/19  Yes [name], [name], MD  enoxaparin (LOVENOX) 40 MG/0.4ML injection Inject 0.4 mLs (40 mg total) into the skin daily. 01/02/20  Yes [name], MD  feeding supplement, ENSURE ENLIVE, (ENSURE ENLIVE) LIQD Take 237 mLs by mouth 3 (three) times daily between meals. 11/07/19  Yes [name], MD  furosemide (LASIX) 20 MG tablet 2 tabs daily for 5 days then 1-2 tabs daily as needed for leg swelling. 10/16/19  Yes [name], [name], MD  guaifenesin (ROBITUSSIN) 100 MG/5ML syrup Take 200 mg by mouth 3 (three) times daily as needed for cough.   Yes [provider]  losartan (COZAAR) 50 MG tablet Take 1 tablet (50 mg total) by mouth daily. 10/15/19  Yes [name], [name], MD  nicotine (NICODERM CQ - DOSED IN MG/24 HOURS) 21 mg/24hr patch Place 1 patch (21 mg total) onto the skin daily. 09/23/19  Yes [name], MD  Oxycodone HCl 10 MG TABS Take 1 tablet (10 mg total) by mouth 2 (two) times daily as needed (for moderate - severe pain).  10/16/19  Yes [name], [name], MD  polyethylene glycol (MIRALAX / GLYCOLAX) 17 g packet Take 34 g by mouth 2 (two) times daily. 09/22/19  Yes [name], MD  potassium chloride SA (KLOR-CON) 20 MEQ tablet Take 1 tablet (20 mEq total) by mouth daily as needed (When taking fluid pill.). 10/16/19  Yes [name], [name], MD  rosuvastatin (CRESTOR) 10 MG tablet Take 1 tablet (10 mg total) by mouth daily. 10/16/19  Yes [name], [name], MD  ARTIFICIAL TEARS 0.2-0.2-1 % SOLN Place 2 drops into both eyes in the morning and at bedtime.  11/15/19   [provider]  aspirin EC 81 MG tablet Take 81 mg by mouth daily. Swallow whole.    [provider]  clopidogrel (PLAVIX) 75 MG tablet Take 1 tablet (75 mg total) by mouth daily. 11/08/19   [name], MD     Vital Signs: BP 125/70 (BP Location: Left Arm)    Pulse 73    Temp 98.6 F (37 C)    Resp 18    Ht 5\' 10"  (1.778 m)    Wt 147 lb 4.3 oz (66.8 kg)    SpO2 100%    BMI 21.13 kg/m   Physical Exam Vitals reviewed.  Skin:    General: Skin is warm.     Comments: Site is clean and dry  NT no bleeding No air leak in Vac 1200 cc total in vac---- serous color (200 cc yesterday as OP per chart)    Neurological:     Mental Status: He is alert.     Imaging: DG Chest Port 1 View  Result Date: 01/10/2020 CLINICAL DATA:  Chest tube. EXAM: PORTABLE CHEST 1 VIEW COMPARISON:  01/08/2020.  CT 01/01/2020. FINDINGS: Right chest tube in stable position. No pneumothorax. Unchanged right pleural effusion with probable loculation. Persistent right base atelectasis/infiltrate. Mediastinum and hilar structures normal. Heart size stable. Degenerative change cervicothoracic spine. IMPRESSION: Right chest tube in stable position. No pneumothorax. Unchanged right pleural effusion with probable loculation. Persistent right base atelectasis/infiltrate. Electronically Signed   By: Marcello Moores  Register   On: 01/10/2020 06:10    Labs:  CBC: Recent Labs     01/08/20 0016 01/09/20 0443 01/11/20 0340 01/13/20 0251  WBC 13.0* 13.0* 14.6* 13.2*  HGB 9.9* 10.5* 10.6* 10.8*  HCT 33.1* 32.9* 33.3* 34.9*  PLT 417* 404* 428* 408*    COAGS: Recent Labs    01/01/20 1018  INR 1.1  APTT 38*    BMP: Recent Labs    09/21/19 0759 09/22/19 0739 11/01/19 2257 11/07/19 0413 11/12/19 1051 12/26/19 1510 01/08/20 0016 01/09/20 0443 01/10/20 0049 01/11/20 0340 01/13/20 0251  NA 139 137 138   < > 145   < > 135  --  137 135 136  K 3.9 3.9 4.7   < > 4.7   < > 4.6 4.6 4.5 4.7 4.3  CL 108 105 102   < > 106   < > 100  --  101 100 104  CO2 20* 23 28   < > 20   < > 28  --  27 27 25   GLUCOSE 90 88 89   < > 83   < > 102*  --  109* 95 109*  BUN 21 25* 16   < > 34*   < > 19  --  21 21 25*  CALCIUM 8.2* 8.5* 9.2   < > 9.3   < > 8.3*  --  8.2* 8.5* 8.6*  CREATININE 1.07 1.07 1.29*   < > 1.20*   < > 1.21 1.31* 1.34* 1.33* 1.31*  GFRNONAA >60 >60 53*   < > 58*   < > >60 56* 54* 55* 56*  GFRAA >60 >60 >60  --  67  --   --   --   --   --   --    < > = values in this interval not displayed.    LIVER FUNCTION TESTS: Recent Labs    09/19/19 0021 11/01/19 2257 12/26/19 1510 01/03/20 0100 01/10/20 0049 01/11/20 0340 01/13/20 0251  BILITOT 0.8 1.0 0.9 0.8  --   --   --   AST 20 26 23 24   --   --   --   ALT 12 19 18 19   --   --   --   ALKPHOS 97 107 85 65  --   --   --   PROT 7.0 7.9 8.0 6.4*  --   --   --   ALBUMIN 3.5 4.2 3.2* 2.0* 2.0* 2.0* 2.1*    Assessment and Plan:  IR will follow along Plan per TCTS When OP under 50 cc daily; switch to MinExpress per TCTS note  Electronically Signed: Lavonia Drafts, PA-C 01/13/2020, 9:21 AM   I spent a total of 15 Minutes at the the  patient's bedside AND on the patient's hospital floor or unit, greater than 50% of which was counseling/coordinating care for Rt CT

## 2020-01-13 NOTE — Progress Notes (Addendum)
PROGRESS NOTE  [name] [name] JJK:[id number] DOB: 1940-09-29   PCP: [name], [name], MD  Patient is from: [facility] SN[facility]  DOA: 01/02/2020 LOS: 20  Chief complaints: Confusion and shortness of breath  Brief Narrative / Interim history: 79 year old male with history of recent CVA in 11/2019 with residual dysarthria, HTN, HLD and spinal stenosis initially presented to [facility] with confusion and difficulty breathing.  Work-up revealed right-sided loculated pleural effusion with concern for empyema.  IR was not able to place chest tube at [facility] due to DVT Curnes of pleural fluid.  He was evaluated by general surgery who recommended transfer to [facility] for CTS evaluation.  Patient was evaluated by CTS who recommended chest tube placement by IR, that was performed on 01/03/2020.  ID consulted and recommended transitioning from IV Zosyn to ceftriaxone and p.o. Flagyl while in house, and discharged on p.o. Augmentin for a total of 6 weeks starting 01/03/2020.  CTS to place chest tube to waterseal with the hope to discharge with mini expressed to SNF.  Subjective: Seen and examined earlier this morning.  No major events overnight of this morning.  He is awake.  Oriented to self and place but less interactive.  Responds no to pain or shortness of breath.  Objective: Vitals:   01/12/20 1524 01/12/20 2127 01/13/20 0414 01/13/20 1302  BP: 125/70 113/70 125/70 127/73  Pulse: 85 81 73 83  Resp: 16 16 18 14   Temp: 97.8 F (36.6 C) 98.7 F (37.1 C) 98.6 F (37 C) 97.7 F (36.5 C)  TempSrc: Oral Axillary    SpO2: 100% 97% 100% 99%  Weight:      Height:        Intake/Output Summary (Last 24 hours) at 01/13/2020 1352 Last data filed at 01/13/2020 0845 Gross per 24 hour  Intake 1120 ml  Output 1850 ml  Net -730 ml   Filed Weights   01/03/20 1414  Weight: 66.8 kg    Examination:  GENERAL: No apparent distress.  Nontoxic. HEENT: MMM.  Vision and hearing grossly intact.  NECK:  Supple.  No apparent JVD.  RESP: On RA.  No IWOB.  Fair aeration bilaterally.  Chest tube to right chest. CVS:  RRR. Heart sounds normal.  ABD/GI/GU: BS+. Abd soft, NTND.  MSK/EXT:  Moves extremities. No apparent deformity. No edema.  SKIN: no apparent skin lesion or wound NEURO: Awake.  Oriented x2.  No apparent focal neuro deficit. PSYCH: Calm. Normal affect.   Procedures:  12/3-pigtail chest tube placement by IR  Microbiology summarized: COVID-19 and influenza PCR nonreactive MRSA PCR nonreactive Pleural fluid culture NGTD  Assessment & Plan: Right-sided parapneumonic effusion/empyema -Pigtail chest tube to right chest on 12/3.  Chest tube output about 200 cc/ 24 hours. -Not a candidate for VATS surgery with severe malnutrition, frailty and comorbidities. -Plan to transition to water seal today, and discharge to SNF with mini express if stable -Culture data as above.  Procalcitonin negative.  CRP 2.4 -Rocephin + Azithro>> Zosyn 12/2>>> CTX + PO Flagyl per ID on 12/12>> -Per ID, p.o. Augmentin on discharge for a total of 6 weeks starting 01/03/2020 -CVTS following.  Acute hypoxemic respiratory failure: Intermittently desaturated to 70s and 80s when attempted to wean his oxygen.  Now on room air. -Incentive spirometry/OOB/PT/OT  Renal insufficiency: Cr stable at 1.3. Doesn't meet criteria for AKI or CKD yet.  Good urine output.  Losartan discontinued on 12/10.  Zosyn changed to ceftriaxone and Flagyl as above. -Avoid/minimize nephrotoxic meds -Continue  monitoring  History of CVA in October 2021 with residual dysarthria: Stable. -Continue aspirin, Plavix, Crestor  Essential hypertension: Normotensive off medications. -Losartan discontinued on 12/10.  Anxiety/depression: Stable. -Continue Cymbalta  Dementia without behavioral disturbance: Stable.  Oriented x2 but less interactive. -Reorientation and delirium precautions.  Debility/physical deconditioning -PT/OT  recommended SNF  Leukocytosis/bandemia: Due to #1.  WBC slightly up today. -Continue monitoring  Addendum Goal of care: Patient with significant comorbidity and frailty as above.  He is a still full code which I believe would pose more harm than benefit.  I have discussed this with patient's sister, Ms. [name] [name] who has been his Scientist, research (medical) although she has no formal HC POA paper.  She thinks he had 3 children that are estranged from him. Ms. [name] [name] does not think he has anything written down about his weights.  However, she says "I do not want CPR for myself".  She thinks DNR/DNI is to his best interest given his current condition.  She appreciated bringing this up.  -CODE STATUS changed to DNR/DNI.  RN notified via secure chart. -I will have palliative care follow-up on him on discharge.  Severe malnutrition as evidenced by low BMI and significant muscle mass and subcu fat loss Body mass index is 21.13 kg/m. Nutrition Problem: Severe Malnutrition Etiology: chronic illness (CVA) Signs/Symptoms: moderate fat depletion,severe fat depletion,moderate muscle depletion,severe muscle depletion Interventions: Ensure Enlive (each supplement provides 350kcal and 20 grams of protein),Magic cup,MVI   DVT prophylaxis:  enoxaparin (LOVENOX) injection 40 mg Start: 01/03/20 1000  Code Status: Full code Family Communication: Updated patient's sister, Ms. [name] [name] over the phone. Status is: Inpatient  Remains inpatient appropriate because:Unsafe d/c plan, IV treatments appropriate due to intensity of illness or inability to take PO and Inpatient level of care appropriate due to severity of illness   Dispo:  Patient From: [place]  Planned Disposition: [place]  Expected discharge date: 01/17/2020  Medically stable for discharge: No        Consultants:  CVTS-following IR-signed off Infectious disease-signed off.   Sch Meds:  Scheduled  Meds: . aspirin EC  81 mg Oral Daily  . clopidogrel  75 mg Oral Daily  . DULoxetine  60 mg Oral Daily  . enoxaparin (LOVENOX) injection  40 mg Subcutaneous Daily  . feeding supplement  237 mL Oral TID BM  . metroNIDAZOLE  500 mg Oral Q8H  . multivitamin with minerals  1 tablet Oral Daily  . rosuvastatin  10 mg Oral Daily   Continuous Infusions: . cefTRIAXone (ROCEPHIN)  IV 2 g (01/13/20 1350)   PRN Meds:.acetaminophen, ondansetron (ZOFRAN) IV  Antimicrobials: Anti-infectives (From admission, onward)   Start     Dose/Rate Route Frequency Ordered Stop   01/12/20 1400  metroNIDAZOLE (FLAGYL) tablet 500 mg        500 mg Oral Every 8 hours 01/12/20 1111     01/12/20 1300  cefTRIAXone (ROCEPHIN) 2 g in sodium chloride 0.9 % 100 mL IVPB        2 g 200 mL/hr over 30 Minutes Intravenous Every 24 hours 01/12/20 1111     01/02/20 2330  piperacillin-tazobactam (ZOSYN) IVPB 3.375 g  Status:  Discontinued        3.375 g 12.5 mL/hr over 240 Minutes Intravenous Every 8 hours 01/02/20 2242 01/12/20 1111       I have personally reviewed the following labs and images: CBC: Recent Labs  Lab 01/07/20 0429 01/08/20 0016 01/09/20 0443 01/11/20 0340  01/13/20 0251  WBC 12.5* 13.0* 13.0* 14.6* 13.2*  NEUTROABS  --   --  9.7*  --  10.0*  HGB 10.1* 9.9* 10.5* 10.6* 10.8*  HCT 33.6* 33.1* 32.9* 33.3* 34.9*  MCV 83.8 83.6 81.2 81.8 81.7  PLT 396 417* 404* 428* 408*   BMP &GFR Recent Labs  Lab 01/08/20 0016 01/09/20 0443 01/10/20 0049 01/11/20 0340 01/13/20 0251  NA 135  --  137 135 136  K 4.6 4.6 4.5 4.7 4.3  CL 100  --  101 100 104  CO2 28  --  27 27 25   GLUCOSE 102*  --  109* 95 109*  BUN 19  --  21 21 25*  CREATININE 1.21 1.31* 1.34* 1.33* 1.31*  CALCIUM 8.3*  --  8.2* 8.5* 8.6*  MG  --  1.8 1.8 1.8 1.7  PHOS  --  2.9 3.1 2.6 3.0   Estimated Creatinine Clearance: 43.9 mL/min (A) (by C-G formula based on SCr of 1.31 mg/dL (H)). Liver & Pancreas: Recent Labs  Lab 01/10/20 0049  01/11/20 0340 01/13/20 0251  ALBUMIN 2.0* 2.0* 2.1*   No results for input(s): LIPASE, AMYLASE in the last 168 hours. No results for input(s): AMMONIA in the last 168 hours. Diabetic: No results for input(s): HGBA1C in the last 72 hours. Recent Labs  Lab 01/11/20 0743  GLUCAP 86   Cardiac Enzymes: No results for input(s): CKTOTAL, CKMB, CKMBINDEX, TROPONINI in the last 168 hours. No results for input(s): PROBNP in the last 8760 hours. Coagulation Profile: No results for input(s): INR, PROTIME in the last 168 hours. Thyroid Function Tests: No results for input(s): TSH, T4TOTAL, FREET4, T3FREE, THYROIDAB in the last 72 hours. Lipid Profile: No results for input(s): CHOL, HDL, LDLCALC, TRIG, CHOLHDL, LDLDIRECT in the last 72 hours. Anemia Panel: No results for input(s): VITAMINB12, FOLATE, FERRITIN, TIBC, IRON, RETICCTPCT in the last 72 hours. Urine analysis:    Component Value Date/Time   COLORURINE YELLOW (A) 12/29/2019 1127   APPEARANCEUR HAZY (A) 12/29/2019 1127   APPEARANCEUR Cloudy 01/02/2012 1326   LABSPEC 1.019 12/29/2019 1127   LABSPEC 1.017 01/02/2012 1326   PHURINE 5.0 12/29/2019 1127   GLUCOSEU NEGATIVE 12/29/2019 1127   GLUCOSEU NEGATIVE 09/26/2017 1104   HGBUR NEGATIVE 12/29/2019 [place] 12/29/2019 1127   BILIRUBINUR Negative 01/02/2012 [employer] 12/29/2019 1127   PROTEINUR NEGATIVE 12/29/2019 1127   UROBILINOGEN 0.2 09/26/2017 1104   NITRITE NEGATIVE 12/29/2019 1127   LEUKOCYTESUR MODERATE (A) 12/29/2019 1127   LEUKOCYTESUR 3+ 01/02/2012 1326   Sepsis Labs: Invalid input(s): PROCALCITONIN, [place]  Microbiology: Recent Results (from the past 240 hour(s))  Aerobic/Anaerobic Culture (surgical/deep wound)     Status: None   Collection Time: 01/03/20  4:19 PM   Specimen: Abscess  Result Value Ref Range Status   Specimen Description ABSCESS  Final   Special Requests RIGHT PLEURAL  Final   Gram Stain   Final     RARE WBC PRESENT, PREDOMINANTLY PMN NO ORGANISMS SEEN    Culture   Final    No growth aerobically or anaerobically. Performed at [facility], [place] [address] [place], [place] [address]    Report Status 01/08/2020 FINAL  Final    Radiology Studies: No results found.   Shonta Phillis T. [place]  If 7PM-7AM, please contact night-coverage [email or web address] 01/13/2020, 1:52 PM

## 2020-01-13 NOTE — Progress Notes (Signed)
      [address]Suite [address]       [place], [place] [address]             [phone]       Vitals:   01/12/20 2127 01/13/20 0414  BP: 113/70 125/70  Pulse: 81 73  Resp: 16 18  Temp: 98.7 F (37.1 C) 98.6 F (37 C)  SpO2: 97% 100%    Results for orders placed or performed during the hospital encounter of 01/02/20  MRSA PCR Screening     Status: None   Collection Time: 01/02/20  6:38 AM   Specimen: Nasal Mucosa; Nasopharyngeal  Result Value Ref Range Status   MRSA by PCR NEGATIVE NEGATIVE Final    Comment:        The GeneXpert MRSA Assay (FDA approved for NASAL specimens only), is one component of a comprehensive MRSA colonization surveillance program. It is not intended to diagnose MRSA infection nor to guide or monitor treatment for MRSA infections. Performed at [facility], [place] [address] [place], [place] [address]   Aerobic/Anaerobic Culture (surgical/deep wound)     Status: None   Collection Time: 01/03/20  4:19 PM   Specimen: Abscess  Result Value Ref Range Status   Specimen Description ABSCESS  Final   Special Requests RIGHT PLEURAL  Final   Gram Stain   Final    RARE WBC PRESENT, PREDOMINANTLY PMN NO ORGANISMS SEEN    Culture   Final    No growth aerobically or anaerobically. Performed at [facility], [place] [address] [place], [place] [address]    Report Status 01/08/2020 FINAL  Final    Conts with moderate chest tube drainage, 200 in last 24 hours  Leukocytosis improving  On Rocephin/Flagyl- as per medicine service management  No change from CT surgery perspective on current management    John Giovanni, PA-C

## 2020-01-13 NOTE — Progress Notes (Signed)
Physical Therapy Treatment Patient Details Name: [name] [name] MRN: [id number] DOB: [birth year] Today's Date: 01/13/2020    History of Present Illness Pt is a 79 y.o. male (from SNF) admitted 01/02/20 as transfer from [facility] (admitted there 12/26/19) for CTS evaluation for possible VATS decortication. Workup revealed R-side loculated pleural efffusion with concern for empyema, possible PNA. PMH includes lumbar stenosis, renal disorder.    PT Comments    Pt supine on arrival, agreeable to therapy session and with good participation and tolerance for mobility. Pt remains significantly deconditioned and noted to be incontinent of bowels when staff arrived to room, pt rolled L/R x3 reps ea side with minA to min guard, dependent for peri-care but pt able to wipe his face during cleanup and under arms with cues/increased time. Pt performed supine/seated BLE therapeutic exercises with good tolerance, needing multimodal cues and increased time to perform. Pt performed sit<>stand from bed to RW with +33modA and bed height elevated, able to take shuffled steps toward [facility] but deferred chair transfer due to pt reporting severe fatigue after mobility tasks. Pt CT now on water seal. Plan to progress standing tolerance/gait trial with chair follow next session. Pt continues to benefit from skilled rehab in a post acute setting to maximize functional gains before returning home.    Follow Up Recommendations  SNF     Equipment Recommendations  Rolling walker with 5" wheels;3in1 (PT);Wheelchair (measurements PT);Wheelchair cushion (measurements PT)    Recommendations for Other Services       Precautions / Restrictions Precautions Precautions: Fall Precaution Comments:  chest tube, Watch SpO2; bladder/bowel incontinence Restrictions Weight Bearing Restrictions: No    Mobility  Bed Mobility Overal bed mobility: Needs Assistance Bed Mobility: Rolling;Sidelying to Sit;Sit to Sidelying Rolling: Min  assist Sidelying to sit: Mod assist     Sit to sidelying: Mod assist General bed mobility comments: assist to raise trunk and for LEs back into bed  Transfers Overall transfer level: Needs assistance Equipment used: Rolling walker (2 wheeled) Transfers: Sit to/from Stand Sit to Stand: Mod assist;From elevated surface         General transfer comment: assist to rise and steady, cues for hand placement; posterior lean  Ambulation/Gait Ambulation/Gait assistance: Mod assist Gait Distance (Feet): 3 Feet Assistive device: Rolling walker (2 wheeled) Gait Pattern/deviations: Step-to pattern;Trunk flexed;Leaning posteriorly Gait velocity: Decreased   General Gait Details: ~6 small shuffled steps toward HOB using RW, pt able to move RW on his own but ax cues needed for posture and step sequencing   Stairs             Wheelchair Mobility    Modified Rankin (Stroke Patients Only)       Balance Overall balance assessment: Needs assistance Sitting-balance support: Bilateral upper extremity supported;Feet supported Sitting balance-Leahy Scale: Fair Sitting balance - Comments: close supervision static sitting balance EOB, when performing LE exercises EOB pt with significant posterior lean Postural control: Posterior lean Standing balance support: Bilateral upper extremity supported Standing balance-Leahy Scale: Poor Standing balance comment: reliant on RW and external support, heavy posterior lean                            Cognition Arousal/Alertness: Awake/alert Behavior During Therapy: Flat affect Overall Cognitive Status: History of cognitive impairments - at baseline Area of Impairment: Problem solving;Awareness;Safety/judgement;Following commands;Attention                 Orientation Level: Disoriented to;Place;Time Current  Attention Level: Focused Memory: Decreased short-term memory Following Commands: Follows one step commands with increased  time Safety/Judgement: Decreased awareness of safety;Decreased awareness of deficits Awareness: Intellectual Problem Solving: Slow processing;Requires verbal cues;Requires tactile cues;Decreased initiation General Comments: pt with baseline dementia per sister      Exercises General Exercises - Lower Extremity Ankle Circles/Pumps: AROM;Strengthening;Both;10 reps Short Arc Quad: AROM;Strengthening;Both;5 reps;Supine Hip Flexion/Marching: AROM;Strengthening;Both;10 reps;Seated Other Exercises Other Exercises: supine BUE AROM: shoulder flexion (to 90 deg only on RUE due to CT on R), elbow extension 1x10 reps ea; pt pushing/pulling toward each bed rail while sidelying during cleanup for BUE exercise x10 reps ea    General Comments General comments (skin integrity, edema, etc.): pt noted to be incontinent of bowels upon staff arrival to room, NT present during portion of session as pt dependent for peri-care      Pertinent Vitals/Pain Pain Assessment: No/denies pain Faces Pain Scale: Hurts a little bit Pain Location: unable to localize Pain Descriptors / Indicators: Grimacing Pain Intervention(s): Monitored during session;Repositioned    Home Living                      Prior Function            PT Goals (current goals can now be found in the care plan section) Acute Rehab PT Goals Patient Stated Goal: sister desires placement of pt PT Goal Formulation: With patient/family Time For Goal Achievement: 01/17/20 Potential to Achieve Goals: Fair Progress towards PT goals: Progressing toward goals    Frequency    Min 2X/week      PT Plan Current plan remains appropriate    Co-evaluation              AM-PAC PT "6 Clicks" Mobility   Outcome Measure  Help needed turning from your back to your side while in a flat bed without using bedrails?: A Little Help needed moving from lying on your back to sitting on the side of a flat bed without using bedrails?: A  Lot Help needed moving to and from a bed to a chair (including a wheelchair)?: A Lot Help needed standing up from a chair using your arms (e.g., wheelchair or bedside chair)?: A Lot Help needed to walk in hospital room?: A Lot Help needed climbing 3-5 steps with a railing? : Total 6 Click Score: 12    End of Session   Activity Tolerance: Patient tolerated treatment well Patient left: in bed;with call bell/phone within reach;with bed alarm set (heels floated) Nurse Communication: Mobility status PT Visit Diagnosis: Difficulty in walking, not elsewhere classified (R26.2);Muscle weakness (generalized) (M62.81)     Time: 2233-6122 PT Time Calculation (min) (ACUTE ONLY): 36 min  Charges:  $Therapeutic Exercise: 8-22 mins $Therapeutic Activity: 8-22 mins                     Gagandeep Pettet P., PTA Acute Rehabilitation Services Pager: [phone] Office: [place] 01/13/2020, 12:07 PM

## 2020-01-13 NOTE — Progress Notes (Signed)
  Subjective: Less responsive today total chest tube output 1200 cc Will place to water seal and follow CXR Objective: Vital signs in last 24 hours: Temp:  [97.3 F (36.3 C)-98.7 F (37.1 C)] 98.6 F (37 C) (12/13 0414) Pulse Rate:  [73-85] 73 (12/13 0414) Cardiac Rhythm: Normal sinus rhythm;Bundle branch block (12/13 0838) Resp:  [15-18] 18 (12/13 0414) BP: (113-125)/(63-70) 125/70 (12/13 0414) SpO2:  [96 %-100 %] 100 % (12/13 0414)  Hemodynamic parameters for last 24 hours:   afebrile Intake/Output from previous day: 12/12 0701 - 12/13 0700 In: 1080 [P.O.:980; IV Piggyback:100] Out: 1750 [Urine:1550; Chest Tube:200] Intake/Output this shift: Total I/O In: 220 [P.O.:220] Out: -   Exam  R chest tube secure- dressing needs to be changed Breath sounds slightly diminished on R base No air leak from tube Lab Results: Recent Labs    01/11/20 0340 01/13/20 0251  WBC 14.6* 13.2*  HGB 10.6* 10.8*  HCT 33.3* 34.9*  PLT 428* 408*   BMET:  Recent Labs    01/11/20 0340 01/13/20 0251  NA 135 136  K 4.7 4.3  CL 100 104  CO2 27 25  GLUCOSE 95 109*  BUN 21 25*  CREATININE 1.33* 1.31*  CALCIUM 8.5* 8.6*    PT/INR: No results for input(s): LABPROT, INR in the last 72 hours. ABG    Component Value Date/Time   HCO3 33.4 (H) 11/01/2019 2257   TCO2 26 12/17/2007 0102   O2SAT 56.6 11/01/2019 2257   CBG (last 3)  Recent Labs    01/11/20 0743  GLUCAP 86    Assessment/Plan: S/P R chest drain for parapneumonic effusion  Remove suction from tube and follow cxr [facility] to transition to [facility] so SNF possible   LOS: 11 days    Tharon Aquas Trigt III 01/13/2020

## 2020-01-13 NOTE — Plan of Care (Signed)
Patient alert and responds to questions with a slow delay. Sister [name], at the bedside this afternoon. Patient remains with chest tube to the right chest. Tolerating ensure beverages.   Problem: Clinical Measurements: Goal: Ability to maintain clinical measurements within normal limits will improve Outcome: Progressing Goal: Will remain free from infection Outcome: Progressing Goal: Diagnostic test results will improve Outcome: Progressing Goal: Respiratory complications will improve Outcome: Progressing Goal: Cardiovascular complication will be avoided Outcome: Progressing   Problem: Pain Managment: Goal: General experience of comfort will improve Outcome: Progressing   Problem: Skin Integrity: Goal: Risk for impaired skin integrity will decrease Outcome: Progressing   Problem: Fluid Volume: Goal: Hemodynamic stability will improve Outcome: Progressing   Problem: Clinical Measurements: Goal: Diagnostic test results will improve Outcome: Progressing Goal: Signs and symptoms of infection will decrease Outcome: Progressing

## 2020-01-14 ENCOUNTER — Inpatient Hospital Stay (HOSPITAL_COMMUNITY): Payer: Medicare HMO

## 2020-01-14 DIAGNOSIS — R5381 Other malaise: Secondary | ICD-10-CM | POA: Diagnosis not present

## 2020-01-14 DIAGNOSIS — J90 Pleural effusion, not elsewhere classified: Secondary | ICD-10-CM | POA: Diagnosis not present

## 2020-01-14 DIAGNOSIS — F0390 Unspecified dementia without behavioral disturbance: Secondary | ICD-10-CM | POA: Diagnosis not present

## 2020-01-14 DIAGNOSIS — R471 Dysarthria and anarthria: Secondary | ICD-10-CM | POA: Diagnosis not present

## 2020-01-14 DIAGNOSIS — J189 Pneumonia, unspecified organism: Secondary | ICD-10-CM | POA: Diagnosis not present

## 2020-01-14 DIAGNOSIS — I10 Essential (primary) hypertension: Secondary | ICD-10-CM | POA: Diagnosis not present

## 2020-01-14 DIAGNOSIS — N289 Disorder of kidney and ureter, unspecified: Secondary | ICD-10-CM | POA: Diagnosis not present

## 2020-01-14 DIAGNOSIS — R4182 Altered mental status, unspecified: Secondary | ICD-10-CM | POA: Diagnosis not present

## 2020-01-14 DIAGNOSIS — E43 Unspecified severe protein-calorie malnutrition: Secondary | ICD-10-CM | POA: Diagnosis not present

## 2020-01-14 DIAGNOSIS — J869 Pyothorax without fistula: Secondary | ICD-10-CM | POA: Diagnosis not present

## 2020-01-14 DIAGNOSIS — Z8673 Personal history of transient ischemic attack (TIA), and cerebral infarction without residual deficits: Secondary | ICD-10-CM | POA: Diagnosis not present

## 2020-01-14 DIAGNOSIS — J9811 Atelectasis: Secondary | ICD-10-CM | POA: Diagnosis not present

## 2020-01-14 NOTE — Progress Notes (Signed)
Occupational Therapy Treatment Patient Details Name: [name] [name] MRN: [id number] DOB: Jan 01, 1941 Today's Date: 01/14/2020    History of present illness Pt is a 79 y.o. male (from SNF) admitted 01/02/20 as transfer from [facility] (admitted there 12/26/19) for CTS evaluation for possible VATS decortication. Workup revealed R-side loculated pleural efffusion with concern for empyema, possible PNA. PMH includes lumbar stenosis, renal disorder.   OT comments  Pt making progress with functional goals. Pt sat EOB with mod A with increased time and effort to participate in UB bathing and dressing mod - min A . Pt stood from EOB with RW mod A for SPTs mod - min A with multimodal cues for hand placement and safety. Pt required total A with toilet hygiene. OT will continue to follow acutely to maximize level of function and safety  Follow Up Recommendations  SNF    Equipment Recommendations   (TBD at next level of care)    Recommendations for Other Services      Precautions / Restrictions Precautions Precautions: Fall Precaution Comments:  chest tube, Watch SpO2; bladder/bowel incontinence Restrictions Weight Bearing Restrictions: No       Mobility Bed Mobility Overal bed mobility: Needs Assistance       Supine to sit: Mod assist;HOB elevated Sit to supine: Mod assist   General bed mobility comments: mod A to bring LEs off EOB and to elevate trunk, mod A for LEs back onto bed. Pt able to scoot to [facility] using rails with min A  Transfers Overall transfer level: Needs assistance Equipment used: Rolling walker (2 wheeled) Transfers: Sit to/from Stand Sit to Stand: Mod assist;From elevated surface         General transfer comment: assist to power up and to  steady, cues for hand placement; posterior lean    Balance Overall balance assessment: Needs assistance Sitting-balance support: Bilateral upper extremity supported;Feet supported Sitting balance-Leahy Scale: Fair Sitting  balance - Comments: posterior leaning, min guard A for balance/support, mod verbal cues to correct sitting posture Postural control: Posterior lean Standing balance support: Bilateral upper extremity supported Standing balance-Leahy Scale: Poor                             ADL either performed or assessed with clinical judgement   ADL Overall ADL's : Needs assistance/impaired Eating/Feeding: Minimal assistance;Sitting Eating/Feeding Details (indicate cue type and reason): sitting EOB Grooming: Wash/dry hands;Wash/dry face;Sitting;Min guard   Upper Body Bathing: Moderate assistance;Sitting Upper Body Bathing Details (indicate cue type and reason): simulated seated EOB Lower Body Bathing: Maximal assistance Lower Body Bathing Details (indicate cue type and reason): simulated seated EOB Upper Body Dressing : Minimal assistance;Sitting Upper Body Dressing Details (indicate cue type and reason): donne dclean gown seated EOB     Toilet Transfer: Stand-pivot;RW;Moderate assistance   Toileting- Clothing Manipulation and Hygiene: Total assistance;Sit to/from stand         General ADL Comments: Pt sat EOB for selfcare tasks but required min guard A for balance/support due to posterior leaning, verbal and physical cues to correct sitting posture     Vision Patient Visual Report: No change from baseline     Perception     Praxis      Cognition Arousal/Alertness: Awake/alert Behavior During Therapy: Flat affect Overall Cognitive Status: History of cognitive impairments - at baseline Area of Impairment: Problem solving;Awareness;Safety/judgement;Following commands;Attention  Orientation Level: Disoriented to;Time;Situation (pt know that he is in hospital)   Memory: Decreased short-term memory Following Commands: Follows one step commands with increased time Safety/Judgement: Decreased awareness of safety;Decreased awareness of deficits   Problem  Solving: Slow processing;Requires verbal cues;Requires tactile cues;Decreased initiation General Comments: pt with baseline dementia per family report        Exercises     Shoulder Instructions       General Comments      Pertinent Vitals/ Pain       Pain Assessment: Faces Faces Pain Scale: Hurts a little bit Pain Location: unable to localize Pain Descriptors / Indicators: Grimacing;Guarding Pain Intervention(s): Monitored during session;Repositioned  Home Living                                          Prior Functioning/Environment              Frequency  Min 2X/week        Progress Toward Goals  OT Goals(current goals can now be found in the care plan section)  Progress towards OT goals: Progressing toward goals     Plan Discharge plan remains appropriate    Co-evaluation                 AM-PAC OT "6 Clicks" Daily Activity     Outcome Measure   Help from another person eating meals?: A Little Help from another person taking care of personal grooming?: A Little Help from another person toileting, which includes using toliet, bedpan, or urinal?: Total Help from another person bathing (including washing, rinsing, drying)?: Total Help from another person to put on and taking off regular upper body clothing?: A Little Help from another person to put on and taking off regular lower body clothing?: Total 6 Click Score: 12    End of Session Equipment Utilized During Treatment: Gait belt;Rolling walker  OT Visit Diagnosis: Unsteadiness on feet (R26.81);Other abnormalities of gait and mobility (R26.89);Muscle weakness (generalized) (M62.81);Pain;Other symptoms and signs involving cognitive function   Activity Tolerance Patient tolerated treatment well   Patient Left in bed;with call bell/phone within reach;with bed alarm set   Nurse Communication          Time: 1445-1510 OT Time Calculation (min): 25 min  Charges: OT General  Charges $OT Visit: 1 Visit OT Treatments $Self Care/Home Management : 8-22 mins $Therapeutic Activity: 8-22 mins     Britt Bottom 01/14/2020, 3:31 PM

## 2020-01-14 NOTE — Progress Notes (Signed)
PROGRESS NOTE  [name] [name] TMH:[id number] DOB: Feb 09, 1940   PCP: [name], [name], MD  Patient is from: [facility]  DOA: 01/02/2020 LOS: 61  Chief complaints: Confusion and shortness of breath  Brief Narrative / Interim history: 79 year old male with history of recent CVA in 11/2019 with residual dysarthria, HTN, HLD and spinal stenosis initially presented to [facility] with confusion and difficulty breathing.  Work-up revealed right-sided loculated pleural effusion with concern for empyema.  IR was not able to place chest tube at [facility] due to DVT Curnes of pleural fluid.  He was evaluated by general surgery who recommended transfer to [facility] for CTS evaluation.  Patient was evaluated by CTS who recommended chest tube placement by IR, that was performed on 01/03/2020.  ID consulted and recommended transitioning from IV Zosyn to ceftriaxone and p.o. Flagyl while in house, and discharged on p.o. Augmentin for a total of 6 weeks starting 01/03/2020.  CTS to place chest tube to waterseal with the hope to discharge with mini expressed to SNF.    Subjective: Seen and examined earlier this morning.  No major events overnight of this morning.  No complaints.  He is awake and oriented to self and place but slow to respond to questions.  Responds no to pain or shortness of breath.  Chest x-ray improved.  Objective: Vitals:   01/13/20 0414 01/13/20 1302 01/13/20 1939 01/14/20 0428  BP: 125/70 127/73 128/67 134/75  Pulse: 73 83 82 72  Resp: 18 14 17 17   Temp: 98.6 F (37 C) 97.7 F (36.5 C) 98.4 F (36.9 C) 98.1 F (36.7 C)  TempSrc:   Oral   SpO2: 100% 99% 97% 99%  Weight:      Height:        Intake/Output Summary (Last 24 hours) at 01/14/2020 1351 Last data filed at 01/14/2020 1230 Gross per 24 hour  Intake 737 ml  Output 750 ml  Net -13 ml   Filed Weights   01/03/20 1414  Weight: 66.8 kg    Examination:  GENERAL: Frail looking elderly male.  No apparent  distress. HEENT: MMM.  Vision and hearing grossly intact.  NECK: Supple.  No apparent JVD.  RESP: On RA.  No IWOB.  Fair aeration bilaterally. CVS:  RRR. Heart sounds normal but distant..  ABD/GI/GU: BS+. Abd soft, NTND.  MSK/EXT:  Moves extremities.  Significant muscle mass and subcu fat loss.  Some contractures. SKIN: no apparent skin lesion or wound NEURO: Awake.  Oriented x2 (self and place).  Follows commands.  No focal neuro deficit.  Global weakness. PSYCH: Calm. Normal affect.  Procedures:  12/3-pigtail chest tube placement by IR  Microbiology summarized: COVID-19 and influenza PCR nonreactive MRSA PCR nonreactive Pleural fluid culture NGTD  Assessment & Plan: Right-sided parapneumonic effusion/empyema -Pigtail chest tube to right chest on 12/3.  Still with significant output from chest tube. -Not a candidate for VATS surgery with severe malnutrition, frailty and comorbidities. -Plan to transition to water seal today, and discharge to SNF with mini express if stable -Culture data as above.  Procalcitonin negative.  CRP 2.4>> 2.0 -Chest x-ray with improved pleural effusion. -Rocephin + Azithro>> Zosyn 12/2>>> CTX + PO Flagyl per ID on 12/12>> -Per ID, p.o. Augmentin on discharge for a total of 6 weeks starting 01/03/2020 -CVTS following.  Acute hypoxemic respiratory failure: Intermittently desaturated to 70s and 80s when attempted to wean his oxygen.  Now on room air. -Incentive spirometry/OOB/PT/OT  Renal insufficiency: Cr stable at 1.3. Doesn't  meet criteria for AKI or CKD yet.  Good urine output.  Losartan discontinued on 12/10.  Zosyn changed to ceftriaxone and Flagyl as above. -Avoid/minimize nephrotoxic meds -Continue monitoring  History of CVA in October 2021 with residual dysarthria: Stable. -Continue aspirin, Plavix, Crestor  Essential hypertension: Normotensive off medications. -Losartan discontinued on 12/10.  Anxiety/depression: Stable. -Continue  Cymbalta  Dementia without behavioral disturbance: Stable.  Oriented x2 but less interactive. -Reorientation and delirium precautions.  Debility/physical deconditioning -PT/OT recommended SNF  Leukocytosis/bandemia: Due to #1.  WBC slightly up today. -Continue monitoring  Goal of care  -DNR/DNI as of 01/13/2020 after discussion with patient's sister, [name]  -Could benefit from palliative follow-up on discharge.   Severe malnutrition as evidenced by low BMI and significant muscle mass and subcu fat loss Body mass index is 21.13 kg/m. Nutrition Problem: Severe Malnutrition Etiology: chronic illness (CVA) Signs/Symptoms: moderate fat depletion,severe fat depletion,moderate muscle depletion,severe muscle depletion Interventions: Ensure Enlive (each supplement provides 350kcal and 20 grams of protein),Magic cup,MVI   DVT prophylaxis:  enoxaparin (LOVENOX) injection 40 mg Start: 01/03/20 1000  Code Status: Full code Family Communication: Updated patient's sister, Ms. Drumwright over the phone. Status is: Inpatient  Remains inpatient appropriate because:Unsafe d/c plan, IV treatments appropriate due to intensity of illness or inability to take PO and Inpatient level of care appropriate due to severity of illness   Dispo:  Patient From: [place]  Planned Disposition: [place]  Expected discharge date: 01/15/2020  Medically stable for discharge: No        Consultants:  CVTS-following IR-signed off Infectious disease-signed off.   Sch Meds:  Scheduled Meds: . aspirin EC  81 mg Oral Daily  . clopidogrel  75 mg Oral Daily  . DULoxetine  60 mg Oral Daily  . enoxaparin (LOVENOX) injection  40 mg Subcutaneous Daily  . feeding supplement  237 mL Oral TID BM  . metroNIDAZOLE  500 mg Oral Q8H  . multivitamin with minerals  1 tablet Oral Daily  . rosuvastatin  10 mg Oral Daily   Continuous Infusions: . cefTRIAXone (ROCEPHIN)  IV 2 g (01/13/20  1350)   PRN Meds:.acetaminophen, ondansetron (ZOFRAN) IV  Antimicrobials: Anti-infectives (From admission, onward)   Start     Dose/Rate Route Frequency Ordered Stop   01/12/20 1400  metroNIDAZOLE (FLAGYL) tablet 500 mg        500 mg Oral Every 8 hours 01/12/20 1111     01/12/20 1300  cefTRIAXone (ROCEPHIN) 2 g in sodium chloride 0.9 % 100 mL IVPB        2 g 200 mL/hr over 30 Minutes Intravenous Every 24 hours 01/12/20 1111     01/02/20 2330  piperacillin-tazobactam (ZOSYN) IVPB 3.375 g  Status:  Discontinued        3.375 g 12.5 mL/hr over 240 Minutes Intravenous Every 8 hours 01/02/20 2242 01/12/20 1111       I have personally reviewed the following labs and images: CBC: Recent Labs  Lab 01/08/20 0016 01/09/20 0443 01/11/20 0340 01/13/20 0251  WBC 13.0* 13.0* 14.6* 13.2*  NEUTROABS  --  9.7*  --  10.0*  HGB 9.9* 10.5* 10.6* 10.8*  HCT 33.1* 32.9* 33.3* 34.9*  MCV 83.6 81.2 81.8 81.7  PLT 417* 404* 428* 408*   BMP &GFR Recent Labs  Lab 01/08/20 0016 01/09/20 0443 01/10/20 0049 01/11/20 0340 01/13/20 0251  NA 135  --  137 135 136  K 4.6 4.6 4.5 4.7 4.3  CL 100  --  101 100 104  CO2 28  --  27 27 25   GLUCOSE 102*  --  109* 95 109*  BUN 19  --  21 21 25*  CREATININE 1.21 1.31* 1.34* 1.33* 1.31*  CALCIUM 8.3*  --  8.2* 8.5* 8.6*  MG  --  1.8 1.8 1.8 1.7  PHOS  --  2.9 3.1 2.6 3.0   Estimated Creatinine Clearance: 43.9 mL/min (A) (by C-G formula based on SCr of 1.31 mg/dL (H)). Liver & Pancreas: Recent Labs  Lab 01/10/20 0049 01/11/20 0340 01/13/20 0251  ALBUMIN 2.0* 2.0* 2.1*   No results for input(s): LIPASE, AMYLASE in the last 168 hours. No results for input(s): AMMONIA in the last 168 hours. Diabetic: No results for input(s): HGBA1C in the last 72 hours. Recent Labs  Lab 01/11/20 0743  GLUCAP 86   Cardiac Enzymes: No results for input(s): CKTOTAL, CKMB, CKMBINDEX, TROPONINI in the last 168 hours. No results for input(s): PROBNP in the last 8760  hours. Coagulation Profile: No results for input(s): INR, PROTIME in the last 168 hours. Thyroid Function Tests: No results for input(s): TSH, T4TOTAL, FREET4, T3FREE, THYROIDAB in the last 72 hours. Lipid Profile: No results for input(s): CHOL, HDL, LDLCALC, TRIG, CHOLHDL, LDLDIRECT in the last 72 hours. Anemia Panel: No results for input(s): VITAMINB12, FOLATE, FERRITIN, TIBC, IRON, RETICCTPCT in the last 72 hours. Urine analysis:    Component Value Date/Time   COLORURINE YELLOW (A) 12/29/2019 1127   APPEARANCEUR HAZY (A) 12/29/2019 1127   APPEARANCEUR Cloudy 01/02/2012 1326   LABSPEC 1.019 12/29/2019 1127   LABSPEC 1.017 01/02/2012 1326   PHURINE 5.0 12/29/2019 1127   GLUCOSEU NEGATIVE 12/29/2019 1127   GLUCOSEU NEGATIVE 09/26/2017 1104   HGBUR NEGATIVE 12/29/2019 [place] 12/29/2019 1127   BILIRUBINUR Negative 01/02/2012 [place] 12/29/2019 1127   PROTEINUR NEGATIVE 12/29/2019 1127   UROBILINOGEN 0.2 09/26/2017 1104   NITRITE NEGATIVE 12/29/2019 1127   LEUKOCYTESUR MODERATE (A) 12/29/2019 1127   LEUKOCYTESUR 3+ 01/02/2012 1326   Sepsis Labs: Invalid input(s): PROCALCITONIN, [place]  Microbiology: No results found for this or any previous visit (from the past 240 hour(s)).  Radiology Studies: DG Chest Port 1 View  Result Date: 01/14/2020 CLINICAL DATA:  Chest tube. EXAM: PORTABLE CHEST 1 VIEW COMPARISON:  01/10/2020. FINDINGS: Mediastinum and hilar structures normal. Heart size normal. Right chest tube in stable position. No pneumothorax. Interim improvement of right pleural effusion. Persistent right base atelectasis. Old left rib fractures again noted. IMPRESSION: 1. Right chest tube in stable position. No pneumothorax. 2. Interim improvement of right pleural effusion. Persistent right base atelectasis. Electronically Signed   By: Marcello Moores  Register   On: 01/14/2020 07:40     Mozel Burdett [place]  If 7PM-7AM, please  contact night-coverage [email or web address] 01/14/2020, 1:51 PM

## 2020-01-14 NOTE — Progress Notes (Signed)
  Subjective: Chest tube now to water seal CXR today shows improvement R effusion Chest tube drainage also decreasing Objective: Vital signs in last 24 hours: Temp:  [97.7 F (36.5 C)-98.4 F (36.9 C)] 97.7 F (36.5 C) (12/14 1407) Pulse Rate:  [72-82] 80 (12/14 1407) Cardiac Rhythm: Normal sinus rhythm;Bundle branch block;Other (Comment) (12/14 0700) Resp:  [16-17] 16 (12/14 1407) BP: (124-134)/(63-75) 124/63 (12/14 1407) SpO2:  [97 %-99 %] 98 % (12/14 1407)  Hemodynamic parameters for last 24 hours:  stable  Intake/Output from previous day: 12/13 0701 - 12/14 0700 In: 680 [P.O.:680] Out: 1250 [Urine:500; Chest Tube:750] Intake/Output this shift: Total I/O In: 617 [P.O.:617] Out: 225 [Urine:225]  No air leak from chest tube  Lab Results: Recent Labs    01/13/20 0251  WBC 13.2*  HGB 10.8*  HCT 34.9*  PLT 408*   BMET:  Recent Labs    01/13/20 0251  NA 136  K 4.3  CL 104  CO2 25  GLUCOSE 109*  BUN 25*  CREATININE 1.31*  CALCIUM 8.6*    PT/INR: No results for input(s): LABPROT, INR in the last 72 hours. ABG    Component Value Date/Time   HCO3 33.4 (H) 11/01/2019 2257   TCO2 26 12/17/2007 0102   O2SAT 56.6 11/01/2019 2257   CBG (last 3)  No results for input(s): GLUCAP in the last 72 hours.  Assessment/Plan: S/P R chest tube for para pneumonic effusion Will leave tube to water seal and hopefully transition to miniexpress system tomorrow which can be followed as outpatient   LOS: 12 days    Tharon Aquas Trigt III 01/14/2020

## 2020-01-14 NOTE — Plan of Care (Signed)
  Problem: Education: Goal: Knowledge of General Education information will improve Description: Including pain rating scale, medication(s)/side effects and non-pharmacologic comfort measures Outcome: Not Progressing   Problem: Clinical Measurements: Goal: Ability to maintain clinical measurements within normal limits will improve Outcome: Not Progressing Goal: Will remain free from infection Outcome: Not Progressing Goal: Diagnostic test results will improve Outcome: Not Progressing Goal: Respiratory complications will improve Outcome: Not Progressing Goal: Cardiovascular complication will be avoided Outcome: Not Progressing   Problem: Pain Managment: Goal: General experience of comfort will improve Outcome: Not Progressing   Problem: Skin Integrity: Goal: Risk for impaired skin integrity will decrease Outcome: Not Progressing   Problem: Fluid Volume: Goal: Hemodynamic stability will improve Outcome: Not Progressing   Problem: Clinical Measurements: Goal: Diagnostic test results will improve Outcome: Not Progressing Goal: Signs and symptoms of infection will decrease Outcome: Not Progressing   Problem: Respiratory: Goal: Ability to maintain adequate ventilation will improve Outcome: Not Progressing

## 2020-01-14 NOTE — Progress Notes (Signed)
Nutrition Follow-up  DOCUMENTATION CODES:   Severe malnutrition in context of chronic illness  INTERVENTION:   -Continue Ensure Enlive po TID, each supplement provides 350 kcal and 20 grams of protein -Continue Magic cup TID with meals, each supplement provides 290 kcal and 9 grams of protein -Continue MVI with minerals daily  NUTRITION DIAGNOSIS:   Severe Malnutrition related to chronic illness (CVA) as evidenced by moderate fat depletion,severe fat depletion,moderate muscle depletion,severe muscle depletion.  Ongoing  GOAL:   Patient will meet greater than or equal to 90% of their needs  Progressing   MONITOR:   PO intake,Supplement acceptance,Labs,Weight trends,Skin,I & O's  REASON FOR ASSESSMENT:   Malnutrition Screening Tool    ASSESSMENT:   [name] [name] is a 79 y.o. male with medical history significant for CVA in October 2021 with residual dysarthria, essential hypertension, hyperlipidemia, spinal stenosis who initially presented at [facility] from [facility] SNF due to confusion and right-sided pneumonia.  12/3- s/p Placement of a rightchest tube. 38F Thal. To pleural evac system.   Reviewed I/O's: -570 ml x 24 hours and -7.3 L since admission  UOP: 500 ml x 24 hours  Chest tube output: 750 ml x 24 hours  Spoke with pt at bedside, who was a little more talkative today as compared to last week. Pt reports good appetite, reports consuming about 50% of meals. Noted meal completions 25-100%. Pt reports consuming Ensure supplements, he estimates he has consumes at least 2 per day.   No wt new weight since admission.   Per CVTS notes, plan to transition to mini express once chest tube output decreased. Plan for SNF placement once medically stable.   Medications reviewed and include lovenox and flagyl.  Labs reviewed.   Diet Order:   Diet Order            Diet regular Room service appropriate? Yes; Fluid consistency: Thin   Diet effective now                 EDUCATION NEEDS:   Education needs have been addressed  Skin:  Skin Assessment: Skin Integrity Issues: Skin Integrity Issues:: Other (Comment) Other: s/p chest tube placement  Last BM:  01/11/20  Height:   Ht Readings from Last 1 Encounters:  01/03/20 5\' 10"  (1.778 m)    Weight:   Wt Readings from Last 1 Encounters:  01/03/20 66.8 kg    Ideal Body Weight:  74.5 kg  BMI:  Body mass index is 21.13 kg/m.  Estimated Nutritional Needs:   Kcal:  2150-2350  Protein:  120-135 grams  Fluid:  > 2 L    Loistine Chance, RD, LDN, [place] Registered Dietitian II Certified Diabetes Care and Education Specialist Please refer to [facility] for RD and/or RD on-call/weekend/after hours pager

## 2020-01-14 NOTE — TOC Progression Note (Signed)
Transition of Care [facility]) - Progression Note    Patient Details  Name: [name] [name] MRN: [id number] Date of Birth: 1940-05-02  Transition of Care [facility]) CM/SW [place], [place] Phone Number: 01/14/2020, 12:35 PM  Clinical Narrative:     CSW spoke to pt and his sister by phone. Pts sister, [name] stated that pt has been at [facility] in [place]. [name] stated [employer] was working on getting the pt long term medicaid. [name] stated if he needs to go back to [facility] that's fine, but if he cant return she will like for him to go to [facility] in [place].   CSW reached ot to [facility] and was told that they did not start pts medicaid application because the last they heard from [place] was that the pt would not be returning. [facility] would not be able to accept him at this time. CSW faxed ot to other facilities in the area.   [facility] stated that pt is in his copay days and the copy is 184 a day. CSW called [name] back to give that information and  Requested a call back.     Barriers to Discharge: Continued Medical Work up,SNF Pending bed offer  Expected Discharge Plan and Services         Living arrangements for the past 2 months: [place]                                       Social Determinants of Health (SDOH) Interventions    Readmission Risk Interventions No flowsheet data found.  Emeterio Reeve, Latanya Presser, [place] Social Worker [phone]

## 2020-01-15 ENCOUNTER — Inpatient Hospital Stay (HOSPITAL_COMMUNITY): Payer: Medicare HMO

## 2020-01-15 DIAGNOSIS — R4182 Altered mental status, unspecified: Secondary | ICD-10-CM | POA: Diagnosis not present

## 2020-01-15 DIAGNOSIS — Z9689 Presence of other specified functional implants: Secondary | ICD-10-CM

## 2020-01-15 DIAGNOSIS — J90 Pleural effusion, not elsewhere classified: Secondary | ICD-10-CM | POA: Diagnosis not present

## 2020-01-15 DIAGNOSIS — J869 Pyothorax without fistula: Secondary | ICD-10-CM | POA: Diagnosis not present

## 2020-01-15 LAB — RENAL FUNCTION PANEL
Albumin: 2 g/dL — ABNORMAL LOW (ref 3.5–5.0)
Anion gap: 10 (ref 5–15)
BUN: 25 mg/dL — ABNORMAL HIGH (ref 8–23)
CO2: 24 mmol/L (ref 22–32)
Calcium: 8.7 mg/dL — ABNORMAL LOW (ref 8.9–10.3)
Chloride: 105 mmol/L (ref 98–111)
Creatinine, Ser: 1.14 mg/dL (ref 0.61–1.24)
GFR, Estimated: >60 mL/min (ref >60)
Glucose, Bld: 112 mg/dL — ABNORMAL HIGH (ref 70–99)
Phosphorus: 3 mg/dL (ref 2.5–4.6)
Potassium: 4.4 mmol/L (ref 3.5–5.1)
Sodium: 139 mmol/L (ref 135–145)

## 2020-01-15 LAB — CBC WITH DIFFERENTIAL/PLATELET
Abs Immature Granulocytes: 0.07 10*3/uL (ref 0.00–0.07)
Basophils Absolute: 0.1 10*3/uL (ref 0.0–0.1)
Basophils Relative: 1 %
Eosinophils Absolute: 0.3 10*3/uL (ref 0.0–0.5)
Eosinophils Relative: 3 %
HCT: 33.6 % — ABNORMAL LOW (ref 39.0–52.0)
Hemoglobin: 10.2 g/dL — ABNORMAL LOW (ref 13.0–17.0)
Immature Granulocytes: 1 %
Lymphocytes Relative: 14 %
Lymphs Abs: 1.6 10*3/uL (ref 0.7–4.0)
MCH: 24.9 pg — ABNORMAL LOW (ref 26.0–34.0)
MCHC: 30.4 g/dL (ref 30.0–36.0)
MCV: 82.2 fL (ref 80.0–100.0)
Monocytes Absolute: 1.3 10*3/uL — ABNORMAL HIGH (ref 0.1–1.0)
Monocytes Relative: 11 %
Neutro Abs: 8.4 10*3/uL — ABNORMAL HIGH (ref 1.7–7.7)
Neutrophils Relative %: 70 %
Platelets: 332 10*3/uL (ref 150–400)
RBC: 4.09 MIL/uL — ABNORMAL LOW (ref 4.22–5.81)
RDW: 14.1 % (ref 11.5–15.5)
WBC: 11.7 10*3/uL — ABNORMAL HIGH (ref 4.0–10.5)
nRBC: 0 % (ref 0.0–0.2)

## 2020-01-15 LAB — MAGNESIUM: Magnesium: 1.8 mg/dL (ref 1.7–2.4)

## 2020-01-15 NOTE — TOC Progression Note (Signed)
Transition of Care [facility]) - Progression Note    Patient Details  Name: [name] [name] MRN: [id number] Date of Birth: 1941-01-07  Transition of Care [facility]) CM/SW [place], [place] Phone Number: 01/15/2020, 1:41 PM  Clinical Narrative:     CSW spoke to pts sister [name] [name] on the phone. CSW explained pt being I copay days and applying for medicaid. [name] [name] stated that pt was denied in the past because he has a home in his name. [name] [name] stated pts stepson lives in the house.   TOC will follow.     Barriers to Discharge: Continued Medical Work up,SNF Pending bed offer  Expected Discharge Plan and Services         Living arrangements for the past 2 months: [place]                                       Social Determinants of Health (SDOH) Interventions    Readmission Risk Interventions No flowsheet data found.  Emeterio Reeve, Latanya Presser, [place] Social Worker [phone]

## 2020-01-15 NOTE — Progress Notes (Addendum)
      [address]       [place] [address]             [phone]       Subjective:  Eating breakfast, no complaints.  Objective: Vital signs in last 24 hours: Temp:  [97.7 F (36.5 C)-98.3 F (36.8 C)] 98.3 F (36.8 C) (12/15 0517) Pulse Rate:  [65-80] 65 (12/15 0517) Cardiac Rhythm: Normal sinus rhythm;Bundle branch block (12/14 2005) Resp:  [15-16] 15 (12/15 0517) BP: (124-132)/(61-74) 132/74 (12/15 0517) SpO2:  [98 %-100 %] 100 % (12/15 0517)  Intake/Output from previous day: 12/14 0701 - 12/15 0700 In: 1097 [P.O.:1097] Out: 845 [Urine:825; Chest Tube:20]  General appearance: alert, cooperative and no distress Heart: regular rate and rhythm Lungs: diminished breath sounds right base Wound: clean and dry  Lab Results: Recent Labs    01/13/20 0251 01/15/20 0112  WBC 13.2* 11.7*  HGB 10.8* 10.2*  HCT 34.9* 33.6*  PLT 408* 332   BMET:  Recent Labs    01/13/20 0251 01/15/20 0112  NA 136 139  K 4.3 4.4  CL 104 105  CO2 25 24  GLUCOSE 109* 112*  BUN 25* 25*  CREATININE 1.31* 1.14  CALCIUM 8.6* 8.7*    PT/INR: No results for input(s): LABPROT, INR in the last 72 hours. ABG    Component Value Date/Time   HCO3 33.4 (H) 11/01/2019 2257   TCO2 26 12/17/2007 0102   O2SAT 56.6 11/01/2019 2257   CBG (last 3)  No results for input(s): GLUCAP in the last 72 hours.  Assessment/Plan:  1. Chest tube- on water seal, 50 cc output yesterday.[name] CXR with improvement of right pleural efffusion- ? Remove chest tube vs connection to Mini Express to keep tube in at discharge, will defer to Dr. Prescott Gum 2. Care per primary   LOS: 13 days    Ellwood Handler, PA-C 01/15/2020  If drainage remains low [less 50cc] and am CXR stable then plan on chest tube remioval   CXR  and medical record reviewed,agree with above note. Tharon Aquas Trigt III 01/15/2020

## 2020-01-15 NOTE — Progress Notes (Signed)
PROGRESS NOTE   [name] [name]  INO:[id number]    DOB: 26-Oct-1940    DOA: 01/02/2020  PCP: [name], [name], MD   I have briefly reviewed patients previous medical records in [name].  Chief complaint: Confusion and shortness of breath.  Brief Narrative:  79 year old male with history of recent CVA in 11/2019 with residual dysarthria, hypertension, hyperlipidemia and spinal stenosis, initially presented to [facility] from [facility]F with confusion and difficulty breathing.  Work-up revealed right-sided loculated pleural effusion with concern for empyema.  IR at [facility] attempted ultrasound-guided chest tube placement but were unable due to thickness of pleural fluid.  He was evaluated by general surgery at [facility] who recommended transfer to [facility] for thoracic surgery evaluation.  Patient was evaluated by thoracic surgery who recommended chest tube placement by IR, that was performed on 01/03/2020.  ID consulted and recommended transitioning from IV Zosyn to IV ceftriaxone and oral Flagyl while in-house and discharged on p.o. Augmentin for a total of 6 weeks with start date of 01/03/2020.  Thoracic surgery continue to follow and await recommendations regarding transitioning to mini express and discharge.   Assessment & Plan:  Active Problems:   Empyema of lung (HCC)   Severe sepsis without septic shock ([place])   Right-sided parapneumonic effusion/empyema:  Pigtail chest tube to right chest on 01/03/2020.  Chest tube output progressively decreasing, 20 mL documented for yesterday.  As per thoracic surgery follow-up, chest tube on waterseal (placed on waterseal 12/14), chest x-ray with improvement of right pleural effusion and awaiting MD decision regarding removing chest tube versus connecting to mini express to keep tube in at discharge.  Not a candidate for VATS surgery due to severe malnutrition, frailty and comorbidities.  Procalcitonin negative, CRP 2.4 >  2.2  Right pleural fluid culture 01/03/2020: No growth aerobically or anaerobically.  Rocephin plus azithromycin >Zosyn 12/2 >ceftriaxone plus oral Flagyl per ID on 12/12  For ID, transition to oral Augmentin at discharge for a total of 6 weeks starting 01/03/2020  Improving.  Acute respiratory failure with hypoxia  Secondary to right-sided empyema.  Resolved.  Saturating in the high 90s-100 on room air.  Continue incentive spirometry and mobilization.  Acute kidney injury complicating stage II chronic kidney disease  Creatinine in the 0.8-0.9 range on 12/1 and 12/2 respectively  Creatinine peaked to 1.3, unclear etiology,?  Poor oral intake.  Losartan was discontinued on 12/10.  Creatinine improved to 1.14.  Follow BMP periodically.  AKI improved  Minimize nephrotoxic medications.  History of CVA in October 2021 with residual dysarthria  Stable.  Continue aspirin, Plavix and Crestor.  Essential hypertension:  Normotensive off of antihypertensives.  Losartan discontinued 12/10.  Monitor closely.  Anxiety and depression:  Stable on Cymbalta, continue  Dementia without behavioral disturbance:  Stable.  Oriented only to self today.  Delirium precautions.  Debility/physical deconditioning  As per therapy recommendations, SNF when clinically cleared.  Anemia, suspect chronic disease  Stable  Leukocytosis/bandemia  Due to empyema  Almost resolved.  Goals of care  DNR/DNI established by prior providers on 01/13/2020 after discussing with patient's sister.  Could benefit from palliative care follow-up at SNF.  Body mass index is 21.13 kg/m.  Nutritional Status Nutrition Problem: Severe Malnutrition Etiology: chronic illness (CVA) Signs/Symptoms: moderate fat depletion,severe fat depletion,moderate muscle depletion,severe muscle depletion Interventions: Ensure Enlive (each supplement provides 350kcal and 20 grams of protein),Magic  cup,MVI  Pressure Ulcer:     DVT prophylaxis: enoxaparin (LOVENOX) injection 40  mg Start: 01/03/20 1000     Code Status: DNR Family Communication: None at bedside today. Disposition:  Status is: Inpatient  Remains inpatient appropriate because:Inpatient level of care appropriate due to severity of illness   Dispo:  Patient From: [place]  Planned Disposition: [place].  As per discussion with TOC team, patient will be difficult to place  Expected discharge date: 01/17/2020  Medically stable for discharge: No         Consultants:   TCTS IR-signed off ID-signed off  Procedures:   Right-sided chest tube.  Antimicrobials:    Anti-infectives (From admission, onward)   Start     Dose/Rate Route Frequency Ordered Stop   01/12/20 1400  metroNIDAZOLE (FLAGYL) tablet 500 mg        500 mg Oral Every 8 hours 01/12/20 1111     01/12/20 1300  cefTRIAXone (ROCEPHIN) 2 g in sodium chloride 0.9 % 100 mL IVPB        2 g 200 mL/hr over 30 Minutes Intravenous Every 24 hours 01/12/20 1111     01/02/20 2330  piperacillin-tazobactam (ZOSYN) IVPB 3.375 g  Status:  Discontinued        3.375 g 12.5 mL/hr over 240 Minutes Intravenous Every 8 hours 01/02/20 2242 01/12/20 1111        Subjective:  Patient alert and oriented only to self.  When asked where he is, he responded "[place]".  Ate well at breakfast.  Objective:   Vitals:   01/14/20 0428 01/14/20 1407 01/14/20 1951 01/15/20 0517  BP: 134/75 124/63 127/61 132/74  Pulse: 72 80 77 65  Resp: 17 16 16 15   Temp: 98.1 F (36.7 C) 97.7 F (36.5 C) 98 F (36.7 C) 98.3 F (36.8 C)  TempSrc:  Oral Oral Oral  SpO2: 99% 98% 98% 100%  Weight:      Height:        General exam: Elderly male, moderately built, frail and chronically ill looking lying comfortably propped up in bed without distress.  On room air. Respiratory system: Slightly diminished breath sounds in the right base but otherwise  clear to auscultation without wheezing, rhonchi or crackles.  No increased work of breathing.  Right-sided chest tube connected to waterseal. Cardiovascular system: S1 & S2 heard, RRR. No JVD, murmurs, rubs, gallops or clicks. No pedal edema.  Telemetry personally reviewed: Sinus rhythm with BBB morphology, occasional PACs. Gastrointestinal system: Abdomen is nondistended, soft and nontender. No organomegaly or masses felt. Normal bowel sounds heard. Central nervous system: Alert and oriented only to self. No focal neurological deficits.  Follows simple instructions. Extremities:?  Left upper extremity weakness, grade 4 x 5 power at least.  Rest of limbs grade 5 x 5 power. Skin: No rashes, lesions or ulcers Psychiatry: Judgement and insight impaired. Mood & affect appropriate.     Data Reviewed:   I have personally reviewed following labs and imaging studies   CBC: Recent Labs  Lab 01/09/20 0443 01/11/20 0340 01/13/20 0251 01/15/20 0112  WBC 13.0* 14.6* 13.2* 11.7*  NEUTROABS 9.7*  --  10.0* 8.4*  HGB 10.5* 10.6* 10.8* 10.2*  HCT 32.9* 33.3* 34.9* 33.6*  MCV 81.2 81.8 81.7 82.2  PLT 404* 428* 408* 332    Basic Metabolic Panel: Recent Labs  Lab 01/11/20 0340 01/13/20 0251 01/15/20 0112  NA 135 136 139  K 4.7 4.3 4.4  CL 100 104 105  CO2 27 25 24   GLUCOSE 95 109* 112*  BUN 21  25* 25*  CREATININE 1.33* 1.31* 1.14  CALCIUM 8.5* 8.6* 8.7*  MG 1.8 1.7 1.8  PHOS 2.6 3.0 3.0    Liver Function Tests: Recent Labs  Lab 01/11/20 0340 01/13/20 0251 01/15/20 0112  ALBUMIN 2.0* 2.1* 2.0*    CBG: Recent Labs  Lab 01/11/20 3291  BTYOMA 00    Microbiology Studies:  No results found for this or any previous visit (from the past 240 hour(s)).   Radiology Studies:  DG Chest Port 1 View  Result Date: 01/15/2020 CLINICAL DATA:  Chest tube EXAM: PORTABLE CHEST 1 VIEW COMPARISON:  01/14/2020 FINDINGS: Right chest tube remains in place without evidence for residual  pneumothorax. The right base collapse/consolidative opacity with small effusion is similar to prior. Left lung clear. The cardiopericardial silhouette is within normal limits for size. Telemetry leads overlie the chest. IMPRESSION: 1. Stable exam. No pneumothorax with right chest tube remaining in place. 2. Persistent right base collapse/consolidative opacity with small effusion. Electronically Signed   By: Misty Stanley M.D.   On: 01/15/2020 07:37   DG Chest Port 1 View  Result Date: 01/14/2020 CLINICAL DATA:  Chest tube. EXAM: PORTABLE CHEST 1 VIEW COMPARISON:  01/10/2020. FINDINGS: Mediastinum and hilar structures normal. Heart size normal. Right chest tube in stable position. No pneumothorax. Interim improvement of right pleural effusion. Persistent right base atelectasis. Old left rib fractures again noted. IMPRESSION: 1. Right chest tube in stable position. No pneumothorax. 2. Interim improvement of right pleural effusion. Persistent right base atelectasis. Electronically Signed   By: Marcello Moores  Register   On: 01/14/2020 07:40     Scheduled Meds:   . aspirin EC  81 mg Oral Daily  . clopidogrel  75 mg Oral Daily  . DULoxetine  60 mg Oral Daily  . enoxaparin (LOVENOX) injection  40 mg Subcutaneous Daily  . feeding supplement  237 mL Oral TID BM  . metroNIDAZOLE  500 mg Oral Q8H  . multivitamin with minerals  1 tablet Oral Daily  . rosuvastatin  10 mg Oral Daily    Continuous Infusions:   . cefTRIAXone (ROCEPHIN)  IV 2 g (01/14/20 1400)     LOS: 13 days     Vernell Leep, MD, [place], [facility]. [facility]    To contact the attending provider between 7A-7P or the covering provider during after hours 7P-7A, please log into the web site [email or web address] and access using universal [place] password for that web site. If you do not have the password, please call the hospital operator.  01/15/2020, 11:57 AM

## 2020-01-15 NOTE — Progress Notes (Signed)
Physical Therapy Treatment Patient Details Name: [name] [name] MRN: [id number] DOB: February 12, 1940 Today's Date: 01/15/2020    History of Present Illness Pt is a 79 y.o. male (from SNF) admitted 01/02/20 as transfer from [facility] (admitted there 12/26/19) for CTS evaluation for possible VATS decortication. Workup revealed R-side loculated pleural efffusion with concern for empyema, possible PNA. PMH includes lumbar stenosis, renal disorder.    PT Comments    Pt supine on arrival, drowsy initially but agreeable to therapy session and with good participation and fair tolerance for mobility. Pt very slow to process cues/respond to questions this date, but able to follow most 1-step commands. Pt performed sit<>stand, bed mobility and stand pivot transfer to chair with modA and RW. Pt reports significant fatigue once seated up in chair, but encouraged to eat dinner, RN/NT notified pt will need assist/encouragement to eat. VSS on RA. Will plan to progress gait distance/standing tolerance next session. Pt will continue to benefit from skilled rehab in a post acute setting to maximize functional gains before returning home.   Follow Up Recommendations  SNF     Equipment Recommendations  Rolling walker with 5" wheels;3in1 (PT);Wheelchair (measurements PT);Wheelchair cushion (measurements PT)    Recommendations for Other Services       Precautions / Restrictions Precautions Precautions: Fall Precaution Comments:  chest tube, Watch SpO2; bladder/bowel incontinence Restrictions Weight Bearing Restrictions: No    Mobility  Bed Mobility Overal bed mobility: Needs Assistance Bed Mobility: Supine to Sit     Supine to sit: Mod assist;HOB elevated     General bed mobility comments: mod A to bring LEs off EOB and to elevate trunk.  Transfers Overall transfer level: Needs assistance Equipment used: Rolling walker (2 wheeled) Transfers: Sit to/from Stand Sit to Stand: Mod assist Stand pivot  transfers: Mod assist       General transfer comment: assist to power up and to  steady, cues for hand placement; posterior lean  Ambulation/Gait Ambulation/Gait assistance: Mod assist Gait Distance (Feet): 4 Feet Assistive device: Rolling walker (2 wheeled) Gait Pattern/deviations: Step-to pattern;Trunk flexed;Leaning posteriorly Gait velocity: Decreased   General Gait Details: bed>chair, small shuffled steps, max cues for sequencing and step placement, assist with RW mgmt   Stairs             Wheelchair Mobility    Modified Rankin (Stroke Patients Only)       Balance Overall balance assessment: Needs assistance Sitting-balance support: Bilateral upper extremity supported;Feet supported Sitting balance-Leahy Scale: Fair Sitting balance - Comments: posterior leaning, min guard A for balance/support, mod verbal cues to correct sitting posture Postural control: Posterior lean Standing balance support: Bilateral upper extremity supported Standing balance-Leahy Scale: Poor Standing balance comment: reliant on RW and external support, heavy posterior lean but improves slightly with increased time in stance                            Cognition Arousal/Alertness: Awake/alert Behavior During Therapy: Flat affect Overall Cognitive Status: History of cognitive impairments - at baseline Area of Impairment: Problem solving;Awareness;Safety/judgement;Following commands;Attention                 Orientation Level: Disoriented to;Time;Situation Current Attention Level: Focused Memory: Decreased short-term memory Following Commands: Follows one step commands with increased time Safety/Judgement: Decreased awareness of safety;Decreased awareness of deficits Awareness: Intellectual Problem Solving: Slow processing;Requires verbal cues;Requires tactile cues;Decreased initiation General Comments: pt with baseline dementia per family report  Exercises       General Comments General comments (skin integrity, edema, etc.): SpO2 99-100% on RA, cues for pursed-lip breathing due to increased WOB with mobility, HR 60-70's bpm; remained up in chair with spoon in hand eating bites from Magic Cup at end of session, RN/NT notified pt will need assist      Pertinent Vitals/Pain Pain Assessment: Faces Faces Pain Scale: Hurts a little bit Pain Location: chest tube insertion site Pain Descriptors / Indicators: Grimacing Pain Intervention(s): Monitored during session;Repositioned   Vitals:   01/15/20 1529  BP: 125/76  Pulse: 72  Resp: 17  Temp: 98 F (36.7 C)  SpO2: 100%    Home Living                      Prior Function            PT Goals (current goals can now be found in the care plan section) Acute Rehab PT Goals Patient Stated Goal: sister desires placement of pt PT Goal Formulation: With patient/family Time For Goal Achievement: 01/17/20 Potential to Achieve Goals: Fair Progress towards PT goals: Progressing toward goals    Frequency    Min 2X/week      PT Plan Current plan remains appropriate    Co-evaluation              AM-PAC PT "6 Clicks" Mobility   Outcome Measure  Help needed turning from your back to your side while in a flat bed without using bedrails?: A Little Help needed moving from lying on your back to sitting on the side of a flat bed without using bedrails?: A Lot Help needed moving to and from a bed to a chair (including a wheelchair)?: A Lot Help needed standing up from a chair using your arms (e.g., wheelchair or bedside chair)?: A Lot Help needed to walk in hospital room?: A Lot Help needed climbing 3-5 steps with a railing? : Total 6 Click Score: 12    End of Session Equipment Utilized During Treatment: Gait belt Activity Tolerance: Patient tolerated treatment well Patient left: in chair;with call bell/phone within reach;with chair alarm set Nurse Communication: Mobility  status PT Visit Diagnosis: Difficulty in walking, not elsewhere classified (R26.2);Muscle weakness (generalized) (M62.81)     Time: 6144-3154 PT Time Calculation (min) (ACUTE ONLY): 23 min  Charges:  $Therapeutic Activity: 23-37 mins                     Donnice Nielsen P., PTA Acute Rehabilitation Services Pager: [phone] Office: [place] 01/15/2020, 5:27 PM

## 2020-01-16 ENCOUNTER — Inpatient Hospital Stay (HOSPITAL_COMMUNITY): Payer: Medicare HMO

## 2020-01-16 DIAGNOSIS — J90 Pleural effusion, not elsewhere classified: Secondary | ICD-10-CM | POA: Diagnosis not present

## 2020-01-16 DIAGNOSIS — R4182 Altered mental status, unspecified: Secondary | ICD-10-CM | POA: Diagnosis not present

## 2020-01-16 DIAGNOSIS — J869 Pyothorax without fistula: Secondary | ICD-10-CM | POA: Diagnosis not present

## 2020-01-16 DIAGNOSIS — Z9689 Presence of other specified functional implants: Secondary | ICD-10-CM | POA: Diagnosis not present

## 2020-01-16 DIAGNOSIS — J9811 Atelectasis: Secondary | ICD-10-CM | POA: Diagnosis not present

## 2020-01-16 LAB — CREATININE, SERUM
Creatinine, Ser: 1.06 mg/dL (ref 0.61–1.24)
GFR, Estimated: >60 mL/min (ref >60)

## 2020-01-16 NOTE — Progress Notes (Addendum)
      [address]       [place] [address]             [phone]         Subjective: No complaints this morning. Appears tired and unable to give me a good cough.   Objective: Vital signs in last 24 hours: Temp:  [98 F (36.7 C)-98.4 F (36.9 C)] 98.4 F (36.9 C) (12/16 0445) Pulse Rate:  [69-72] 69 (12/16 0445) Cardiac Rhythm: Normal sinus rhythm (12/15 0900) Resp:  [15-17] 15 (12/16 0445) BP: (125-137)/(76-77) 137/77 (12/16 0445) SpO2:  [98 %-100 %] 98 % (12/16 0445)     Intake/Output from previous day: 12/15 0701 - 12/16 0700 In: 520 [P.O.:520] Out: 950 [Urine:950] Intake/Output this shift: No intake/output data recorded.  General appearance: alert, cooperative and no distress Heart: sinus bradycardia Lungs: clear to auscultation bilaterally Abdomen: soft, non-tender; bowel sounds normal; no masses,  no organomegaly Extremities: extremities normal, atraumatic, no cyanosis or edema Wound: clean and dry  Lab Results: Recent Labs    01/15/20 0112  WBC 11.7*  HGB 10.2*  HCT 33.6*  PLT 332   BMET:  Recent Labs    01/15/20 0112 01/16/20 0428  NA 139  --   K 4.4  --   CL 105  --   CO2 24  --   GLUCOSE 112*  --   BUN 25*  --   CREATININE 1.14 1.06  CALCIUM 8.7*  --     PT/INR: No results for input(s): LABPROT, INR in the last 72 hours. ABG    Component Value Date/Time   HCO3 33.4 (H) 11/01/2019 2257   TCO2 26 12/17/2007 0102   O2SAT 56.6 11/01/2019 2257   CBG (last 3)  No results for input(s): GLUCAP in the last 72 hours.  Assessment/Plan:  1. Chest tube: 30cc/12 hours. CXR this morning showed, Right chest tube in stable position. No pneumothorax. Persistent right base atelectasis/infiltrate and small right pleural effusion. Mild left base subsegmental atelectasis. 2. Pulm: tolerating room air with good oxygen saturation.  3. Renal: creatinine 1.14, electrolytes okay  Plan: Will hopefully be able to remove chest tube today  if okay with Dr. Prescott Gum. Will order a CXR for the morning.   9:15am: Chest tube removed without issue. Only a anchor suture was present so this was cut and a Vaseline gauze dressing placed over the incision. Patient tolerated well and fell asleep after the procedure. Not in any pain currently.    LOS: 14 days    Elgie Collard 01/16/2020   CXR today is satisfactory and CT drainage scant Agree with assessment and plan  above Tharon Aquas trigt MD

## 2020-01-16 NOTE — Progress Notes (Signed)
PROGRESS NOTE   [name] [name]  TKW:[id number]    DOB: [birth year]    DOA: 01/02/2020  PCP: [name], [name], MD   I have briefly reviewed patients previous medical records in [facility].  Chief complaint: Confusion and shortness of breath.  Brief Narrative:  79 year old male with history of recent CVA in 11/2019 with residual dysarthria, hypertension, hyperlipidemia and spinal stenosis, initially presented to [facility] from [facility] with confusion and difficulty breathing.  Work-up revealed right-sided loculated pleural effusion with concern for empyema.  IR at [facility] attempted ultrasound-guided chest tube placement but were unable due to thickness of pleural fluid.  He was evaluated by general surgery at [facility] who recommended transfer to [facility] for thoracic surgery evaluation.  Patient was evaluated by thoracic surgery who recommended chest tube placement by IR, that was performed on 01/03/2020.  ID consulted and recommended transitioning from IV Zosyn to IV ceftriaxone and oral Flagyl while in-house and discharged on p.o. Augmentin for a total of 6 weeks with start date of 01/03/2020.  Thoracic surgery continue to follow and await recommendations regarding transitioning to mini express and discharge.  Right-sided chest tube removed 12/16.   Assessment & Plan:  Active Problems:   Empyema of lung (HCC)   Severe sepsis without septic shock ([place])   Right-sided parapneumonic effusion/empyema:  Pigtail chest tube to right chest on 01/03/2020.  Chest tube output progressively decreasing, 20 mL documented for yesterday.  As per thoracic surgery follow-up, chest tube on waterseal (placed on waterseal 12/14), chest x-ray with improvement of right pleural effusion and awaiting MD decision regarding removing chest tube versus connecting to mini express to keep tube in at discharge.  Not a candidate for VATS surgery due to severe malnutrition, frailty and  comorbidities.  Procalcitonin negative, CRP 2.4 > 2.2  Right pleural fluid culture 01/03/2020: No growth aerobically or anaerobically.  Rocephin plus azithromycin >Zosyn 12/2 >ceftriaxone plus oral Flagyl per ID on 12/12  For ID, transition to oral Augmentin at discharge for a total of 6 weeks starting 01/03/2020  Improving.  Chest tube was removed by TCTS on 12/16.  Chest x-ray reviewed: Bibasilar atelectasis, small right pleural effusion and no pneumothorax.  Acute respiratory failure with hypoxia  Secondary to right-sided empyema.  Resolved.  Continue incentive spirometry and mobilization.  Acute kidney injury complicating stage II chronic kidney disease  Creatinine in the 0.8-0.9 range on 12/1 and 12/2 respectively  Creatinine peaked to 1.3, unclear etiology,?  Poor oral intake.  Losartan was discontinued on 12/10.  Creatinine down to 1.06.  AKI resolved.  Minimize nephrotoxic medications.  History of CVA in October 2021 with residual dysarthria  Stable.  Continue aspirin, Plavix and Crestor.  Essential hypertension:  Normotensive off of antihypertensives.  Losartan discontinued 12/10.  Monitor closely.  Anxiety and depression:  Stable on Cymbalta, continue  Dementia without behavioral disturbance:  Stable.  Oriented only to self today.  Delirium precautions.  Debility/physical deconditioning  As per therapy recommendations, SNF when clinically cleared.  As per discussion with TOC team, will be difficult to place.  Anemia, suspect chronic disease  Stable  Leukocytosis/bandemia  Due to empyema  Almost resolved.  Goals of care  DNR/DNI established by prior providers on 01/13/2020 after discussing with patient's sister.  Could benefit from palliative care follow-up at SNF.  Body mass index is 21.13 kg/m.  Nutritional Status Nutrition Problem: Severe Malnutrition Etiology: chronic illness (CVA) Signs/Symptoms: moderate fat  depletion,severe fat depletion,moderate muscle depletion,severe muscle  depletion Interventions: Ensure Enlive (each supplement provides 350kcal and 20 grams of protein),Magic cup,MVI    DVT prophylaxis: enoxaparin (LOVENOX) injection 40 mg Start: 01/03/20 1000     Code Status: DNR Family Communication: None at bedside today. Disposition:  Status is: Inpatient  Remains inpatient appropriate because:Inpatient level of care appropriate due to severity of illness   Dispo:  Patient From: [place]  Planned Disposition: [place].  As per discussion with TOC team, patient will be difficult to place  Expected discharge date: 01/20/2020  Medically stable for discharge: No         Consultants:   TCTS IR-signed off ID-signed off  Procedures:   Right-sided chest tube.  Antimicrobials:    Anti-infectives (From admission, onward)   Start     Dose/Rate Route Frequency Ordered Stop   01/12/20 1400  metroNIDAZOLE (FLAGYL) tablet 500 mg        500 mg Oral Every 8 hours 01/12/20 1111     01/12/20 1300  cefTRIAXone (ROCEPHIN) 2 g in sodium chloride 0.9 % 100 mL IVPB        2 g 200 mL/hr over 30 Minutes Intravenous Every 24 hours 01/12/20 1111     01/02/20 2330  piperacillin-tazobactam (ZOSYN) IVPB 3.375 g  Status:  Discontinued        3.375 g 12.5 mL/hr over 240 Minutes Intravenous Every 8 hours 01/02/20 2242 01/12/20 1111        Subjective:  Seen this morning prior to chest tube removal.  Overall poor historian.  Stated "pretty good".  Denied complaints.  Denied dyspnea or chest pain.  Had completed eating a bowl of porridge but rest of his breakfast was untouched.  As per RN, no acute issues reported.  Objective:   Vitals:   01/15/20 0517 01/15/20 1529 01/16/20 0445 01/16/20 1429  BP: 132/74 125/76 137/77 116/71  Pulse: 65 72 69 76  Resp: 15 17 15 17   Temp: 98.3 F (36.8 C) 98 F (36.7 C) 98.4 F (36.9 C) 97.8 F (36.6 C)  TempSrc: Oral  Oral Oral Oral  SpO2: 100% 100% 98% 100%  Weight:      Height:        General exam: Elderly male, moderately built, frail and chronically ill looking noted to be sleeping but easily woke up when called his name. Respiratory system: Clear to auscultation anteriorly.  Diminished breath sounds in the bases but rest of lung fields clear posteriorly as well.  No wheezing, rhonchi or crackles.  No increased work of breathing. Cardiovascular system: S1 and S2 heard, RRR.  No JVD, pedal edema.  2/6 systolic murmur best heard at apex. Gastrointestinal system: Abdomen is nondistended, soft and nontender. No organomegaly or masses felt. Normal bowel sounds heard. Central nervous system: Woke up from sleep, alert and oriented only to self. No focal neurological deficits.  Follows simple instructions. Extremities:?  Left upper extremity weakness, grade 4 x 5 power at least.  Rest of limbs grade 5 x 5 power. Skin: No rashes, lesions or ulcers Psychiatry: Judgement and insight impaired. Mood & affect appropriate.     Data Reviewed:   I have personally reviewed following labs and imaging studies   CBC: Recent Labs  Lab 01/11/20 0340 01/13/20 0251 01/15/20 0112  WBC 14.6* 13.2* 11.7*  NEUTROABS  --  10.0* 8.4*  HGB 10.6* 10.8* 10.2*  HCT 33.3* 34.9* 33.6*  MCV 81.8 81.7 82.2  PLT 428* 408* 465    Basic Metabolic Panel:  Recent Labs  Lab 01/11/20 0340 01/13/20 0251 01/15/20 0112 01/16/20 0428  NA 135 136 139  --   K 4.7 4.3 4.4  --   CL 100 104 105  --   CO2 27 25 24   --   GLUCOSE 95 109* 112*  --   BUN 21 25* 25*  --   CREATININE 1.33* 1.31* 1.14 1.06  CALCIUM 8.5* 8.6* 8.7*  --   MG 1.8 1.7 1.8  --   PHOS 2.6 3.0 3.0  --     Liver Function Tests: Recent Labs  Lab 01/11/20 0340 01/13/20 0251 01/15/20 0112  ALBUMIN 2.0* 2.1* 2.0*    CBG: Recent Labs  Lab 01/11/20 0743  GLUCAP 86    Microbiology Studies:  No results found for this or any previous visit (from the  past 240 hour(s)).   Radiology Studies:  DG Chest Port 1 View  Result Date: 01/16/2020 CLINICAL DATA:  Chest tube. EXAM: PORTABLE CHEST 1 VIEW COMPARISON:  01/15/2020. FINDINGS: Right chest tube in stable position. Heart size normal. Persistent right base atelectasis/infiltrate. Mild left base subsegmental atelectasis. Persistent small right pleural effusion. No pneumothorax. Degenerative change thoracic spine. Prior cervical spine fusion. IMPRESSION: 1. Right chest tube in stable position. No pneumothorax. 2. Persistent right base atelectasis/infiltrate and small right pleural effusion. Mild left base subsegmental atelectasis. Electronically Signed   By: Marcello Moores  Register   On: 01/16/2020 07:35   DG Chest Port 1 View  Result Date: 01/15/2020 CLINICAL DATA:  Chest tube EXAM: PORTABLE CHEST 1 VIEW COMPARISON:  01/14/2020 FINDINGS: Right chest tube remains in place without evidence for residual pneumothorax. The right base collapse/consolidative opacity with small effusion is similar to prior. Left lung clear. The cardiopericardial silhouette is within normal limits for size. Telemetry leads overlie the chest. IMPRESSION: 1. Stable exam. No pneumothorax with right chest tube remaining in place. 2. Persistent right base collapse/consolidative opacity with small effusion. Electronically Signed   By: Misty Stanley M.D.   On: 01/15/2020 07:37     Scheduled Meds:   . aspirin EC  81 mg Oral Daily  . clopidogrel  75 mg Oral Daily  . DULoxetine  60 mg Oral Daily  . enoxaparin (LOVENOX) injection  40 mg Subcutaneous Daily  . feeding supplement  237 mL Oral TID BM  . metroNIDAZOLE  500 mg Oral Q8H  . multivitamin with minerals  1 tablet Oral Daily  . rosuvastatin  10 mg Oral Daily    Continuous Infusions:   . cefTRIAXone (ROCEPHIN)  IV 2 g (01/16/20 1416)     LOS: 14 days     Vernell Leep, MD, [place], [facility]. [facility]    To contact the attending provider between 7A-7P or the  covering provider during after hours 7P-7A, please log into the web site [email or web address] and access using universal [place] password for that web site. If you do not have the password, please call the hospital operator.  01/16/2020, 3:53 PM

## 2020-01-17 ENCOUNTER — Inpatient Hospital Stay (HOSPITAL_COMMUNITY): Payer: Medicare HMO

## 2020-01-17 DIAGNOSIS — J869 Pyothorax without fistula: Secondary | ICD-10-CM

## 2020-01-17 DIAGNOSIS — Z9689 Presence of other specified functional implants: Secondary | ICD-10-CM | POA: Diagnosis not present

## 2020-01-17 DIAGNOSIS — J90 Pleural effusion, not elsewhere classified: Secondary | ICD-10-CM | POA: Diagnosis not present

## 2020-01-17 DIAGNOSIS — J9811 Atelectasis: Secondary | ICD-10-CM | POA: Diagnosis not present

## 2020-01-17 LAB — CBC WITH DIFFERENTIAL/PLATELET
Abs Immature Granulocytes: 0.06 10*3/uL (ref 0.00–0.07)
Basophils Absolute: 0.1 10*3/uL (ref 0.0–0.1)
Basophils Relative: 1 %
Eosinophils Absolute: 0.2 10*3/uL (ref 0.0–0.5)
Eosinophils Relative: 2 %
HCT: 32.2 % — ABNORMAL LOW (ref 39.0–52.0)
Hemoglobin: 10.3 g/dL — ABNORMAL LOW (ref 13.0–17.0)
Immature Granulocytes: 1 %
Lymphocytes Relative: 14 %
Lymphs Abs: 1.7 10*3/uL (ref 0.7–4.0)
MCH: 26.1 pg (ref 26.0–34.0)
MCHC: 32 g/dL (ref 30.0–36.0)
MCV: 81.5 fL (ref 80.0–100.0)
Monocytes Absolute: 1.4 10*3/uL — ABNORMAL HIGH (ref 0.1–1.0)
Monocytes Relative: 11 %
Neutro Abs: 8.7 10*3/uL — ABNORMAL HIGH (ref 1.7–7.7)
Neutrophils Relative %: 71 %
Platelets: 315 10*3/uL (ref 150–400)
RBC: 3.95 MIL/uL — ABNORMAL LOW (ref 4.22–5.81)
RDW: 14.3 % (ref 11.5–15.5)
WBC: 12.1 10*3/uL — ABNORMAL HIGH (ref 4.0–10.5)
nRBC: 0 % (ref 0.0–0.2)

## 2020-01-17 NOTE — Progress Notes (Signed)
PROGRESS NOTE   [name] [name]  DVV:[id number]    DOB: [birth year]    DOA: 01/02/2020  PCP: [name], [name], MD   I have briefly reviewed patients previous medical records in [facility].  Chief complaint: Confusion and shortness of breath.  Brief Narrative:  79 year old male with history of recent CVA in 11/2019 with residual dysarthria, hypertension, hyperlipidemia and spinal stenosis, initially presented to [facility] from [facility] SNF with confusion and difficulty breathing.  Work-up revealed right-sided loculated pleural effusion with concern for empyema.  IR at [facility] attempted ultrasound-guided chest tube placement but were unable due to thickness of pleural fluid.  He was evaluated by general surgery at [facility] who recommended transfer to [facility] for thoracic surgery evaluation.  Patient was evaluated by thoracic surgery who recommended chest tube placement by IR, that was performed on 01/03/2020.  ID consulted and recommended transitioning from IV Zosyn to IV ceftriaxone and oral Flagyl while in-house and discharged on p.o. Augmentin for a total of 6 weeks with start date of 01/03/2020.  Thoracic surgery continue to follow and await recommendations regarding transitioning to mini express and discharge.  Right-sided chest tube removed 12/16. TCTS signed off 12/17.   Assessment & Plan:  Active Problems:   Empyema of lung (HCC)   Severe sepsis without septic shock ([place])   Right-sided parapneumonic effusion/empyema:  Pigtail chest tube to right chest on 01/03/2020.  Chest tube output progressively decreasing, 20 mL documented for yesterday.  As per thoracic surgery follow-up, chest tube on waterseal (placed on waterseal 12/14), chest x-ray with improvement of right pleural effusion and awaiting MD decision regarding removing chest tube versus connecting to mini express to keep tube in at discharge.  Not a candidate for VATS surgery due to severe malnutrition, frailty  and comorbidities.  Procalcitonin negative, CRP 2.4 > 2.2  Right pleural fluid culture 01/03/2020: No growth aerobically or anaerobically.  Rocephin plus azithromycin >Zosyn 12/2 >ceftriaxone plus oral Flagyl per ID on 12/12  For ID, transition to oral Augmentin at discharge for a total of 6 weeks starting 01/03/2020  Chest tube was removed by TCTS on 12/16.  Chest x-ray 12/17: No pneumothorax.  Persistent small effusion and basilar atelectasis. TCTS signed off 12/17.  Acute respiratory failure with hypoxia  Secondary to right-sided empyema.  Resolved.  Continue incentive spirometry and mobilization.  Acute kidney injury complicating stage II chronic kidney disease  Creatinine in the 0.8-0.9 range on 12/1 and 12/2 respectively  Creatinine peaked to 1.3, unclear etiology,?  Poor oral intake.  Losartan was discontinued on 12/10.  Creatinine down to 1.06.  AKI resolved.  Minimize nephrotoxic medications.  History of CVA in October 2021 with residual dysarthria  Stable.  Continue aspirin, Plavix and Crestor.  Essential hypertension:  Normotensive off of antihypertensives.  Losartan discontinued 12/10.  Monitor closely.  Anxiety and depression:  Stable on Cymbalta, continue  Dementia without behavioral disturbance:  Stable.  Oriented only to self today.  Delirium precautions.  As per discussion with sister at bedside on 12/17, patient's mental status currently at baseline  Debility/physical deconditioning  As per therapy recommendations, SNF when clinically cleared.  As per discussion with TOC team, will be difficult to place.   Anemia, suspect chronic disease  Stable  Leukocytosis/bandemia  Due to empyema  Almost resolved.  Goals of care  DNR/DNI established by prior providers on 01/13/2020 after discussing with patient's sister.  Could benefit from palliative care follow-up at SNF.  Body mass index is  21.13 kg/m.  Nutritional  Status Nutrition Problem: Severe Malnutrition Etiology: chronic illness (CVA) Signs/Symptoms: moderate fat depletion,severe fat depletion,moderate muscle depletion,severe muscle depletion Interventions: Ensure Enlive (each supplement provides 350kcal and 20 grams of protein),Magic cup,MVI    DVT prophylaxis: enoxaparin (LOVENOX) injection 40 mg Start: 01/03/20 1000     Code Status: DNR Family Communication: Discussed in detail with patient's sister at bedside on 12/17.  Updated care and answered questions. Disposition:  Status is: Inpatient  Remains inpatient appropriate because:Inpatient level of care appropriate due to severity of illness   Dispo:  Patient From:    Planned Disposition: [place].  As per discussion with TOC team, patient will be difficult to place  Expected discharge date: Patient is medically stable for DC to SNF as of 01/17/2020.  Medically stable for discharge:  Yes.         Consultants:   TCTS IR-signed off ID-signed off  Procedures:   Right-sided chest tube-removed 12/16.  Antimicrobials:    Anti-infectives (From admission, onward)   Start     Dose/Rate Route Frequency Ordered Stop   01/12/20 1400  metroNIDAZOLE (FLAGYL) tablet 500 mg        500 mg Oral Every 8 hours 01/12/20 1111     01/12/20 1300  cefTRIAXone (ROCEPHIN) 2 g in sodium chloride 0.9 % 100 mL IVPB        2 g 200 mL/hr over 30 Minutes Intravenous Every 24 hours 01/12/20 1111     01/02/20 2330  piperacillin-tazobactam (ZOSYN) IVPB 3.375 g  Status:  Discontinued        3.375 g 12.5 mL/hr over 240 Minutes Intravenous Every 8 hours 01/02/20 2242 01/12/20 1111        Subjective:  Seen in room along with sister at bedside.  She states that his mental status is at baseline.  Sitting up attempting to eat breakfast but does not appear to be eating much.  Smiles.  Poor historian due to chronic AMS.  Denies complaints.  No dyspnea or pain reported.  As per RN, no acute  issues noted.  Objective:   Vitals:   01/16/20 0445 01/16/20 1429 01/16/20 1957 01/17/20 0555  BP: 137/77 116/71 120/73 118/69  Pulse: 69 76 77 73  Resp: 15 17 17 18   Temp: 98.4 F (36.9 C) 97.8 F (36.6 C) 98 F (36.7 C) 99 F (37.2 C)  TempSrc: Oral Oral Oral Oral  SpO2: 98% 100% 100% 99%  Weight:      Height:        General exam: Elderly male, moderately built, frail and chronically ill looking sitting up in bed with breakfast tray in front of him. Respiratory system: Slightly diminished breath sounds in the bases but otherwise clear to auscultation without wheezing, rhonchi or crackles.  No increased work of breathing. Cardiovascular system: S1 and S2 heard, RRR.  No JVD, pedal edema.  2/6 systolic murmur best heard at apex.  Stable. Gastrointestinal system: Abdomen is nondistended, soft and nontender. No organomegaly or masses felt. Normal bowel sounds heard. Central nervous system: Alert and oriented only to self.  Does not say much.  Smiles to questions.  No focal deficits. Extremities:?  Left upper extremity weakness, grade 4 x 5 power at least.  Rest of limbs grade 5 x 5 power. Skin: No rashes, lesions or ulcers Psychiatry: Judgement and insight impaired. Mood & affect pleasant.     Data Reviewed:   I have personally reviewed following labs and imaging studies  CBC: Recent Labs  Lab 01/13/20 0251 01/15/20 0112 01/17/20 0015  WBC 13.2* 11.7* 12.1*  NEUTROABS 10.0* 8.4* 8.7*  HGB 10.8* 10.2* 10.3*  HCT 34.9* 33.6* 32.2*  MCV 81.7 82.2 81.5  PLT 408* 332 301    Basic Metabolic Panel: Recent Labs  Lab 01/11/20 0340 01/13/20 0251 01/15/20 0112 01/16/20 0428  NA 135 136 139  --   K 4.7 4.3 4.4  --   CL 100 104 105  --   CO2 27 25 24   --   GLUCOSE 95 109* 112*  --   BUN 21 25* 25*  --   CREATININE 1.33* 1.31* 1.14 1.06  CALCIUM 8.5* 8.6* 8.7*  --   MG 1.8 1.7 1.8  --   PHOS 2.6 3.0 3.0  --     Liver Function Tests: Recent Labs  Lab  01/11/20 0340 01/13/20 0251 01/15/20 0112  ALBUMIN 2.0* 2.1* 2.0*    CBG: Recent Labs  Lab 01/11/20 3143  OOILNZ 97    Microbiology Studies:  No results found for this or any previous visit (from the past 240 hour(s)).   Radiology Studies:  DG Chest 1 View  Result Date: 01/17/2020 CLINICAL DATA:  Chest tube removal EXAM: CHEST  1 VIEW COMPARISON:  01/16/2020 FINDINGS: Right-sided chest tube is been removed in the interval. Small right pleural effusion is again identified as well as mild bibasilar atelectasis. No pneumothorax is seen. Skin fold is noted over the right chest. No bony abnormality is seen. Cardiac shadow is stable. IMPRESSION: No pneumothorax following chest tube removal. Persistent small effusion and basilar atelectasis is seen. Electronically Signed   By: Inez Catalina M.D.   On: 01/17/2020 08:07   DG Chest Port 1 View  Result Date: 01/16/2020 CLINICAL DATA:  Chest tube. EXAM: PORTABLE CHEST 1 VIEW COMPARISON:  01/15/2020. FINDINGS: Right chest tube in stable position. Heart size normal. Persistent right base atelectasis/infiltrate. Mild left base subsegmental atelectasis. Persistent small right pleural effusion. No pneumothorax. Degenerative change thoracic spine. Prior cervical spine fusion. IMPRESSION: 1. Right chest tube in stable position. No pneumothorax. 2. Persistent right base atelectasis/infiltrate and small right pleural effusion. Mild left base subsegmental atelectasis. Electronically Signed   By: [place]   On: 01/16/2020 07:35     Scheduled Meds:   . aspirin EC  81 mg Oral Daily  . clopidogrel  75 mg Oral Daily  . DULoxetine  60 mg Oral Daily  . enoxaparin (LOVENOX) injection  40 mg Subcutaneous Daily  . feeding supplement  237 mL Oral TID BM  . metroNIDAZOLE  500 mg Oral Q8H  . multivitamin with minerals  1 tablet Oral Daily  . rosuvastatin  10 mg Oral Daily    Continuous Infusions:   . cefTRIAXone (ROCEPHIN)  IV 2 g (01/17/20 1047)      LOS: 15 days     Vernell Leep, MD, [place], [facility]. [facility]    To contact the attending provider between 7A-7P or the covering provider during after hours 7P-7A, please log into the web site [email or web address] and access using universal [place] password for that web site. If you do not have the password, please call the hospital operator.  01/17/2020, 12:47 PM

## 2020-01-17 NOTE — Progress Notes (Signed)
Occupational Therapy Treatment Patient Details Name: [name] [name] MRN: [id number] DOB: 17-Dec-1940 Today's Date: 01/17/2020    History of present illness Pt is a 79 y.o. male (from SNF) admitted 01/02/20 as transfer from [facility] (admitted there 12/26/19) for CTS evaluation for possible VATS decortication. Workup revealed R-side loculated pleural efffusion with concern for empyema, possible PNA. PMH includes lumbar stenosis, renal disorder.   OT comments  Pt making good progress with functional goals. Pt sat EOB with mod A sup - sit. Pt able to ambulate towards bathroom door with WR with min - mod A for safety. Pt performed UB ADL and grooming sitting EOB and in recliner. Pt requires multimodal cues for safety and sequencing of tasks. OT to continue to follow acutely to maximize level of function and safety  Follow Up Recommendations  SNF;Supervision/Assistance - 24 hour    Equipment Recommendations   (TBD at next venue of care)    Recommendations for Other Services      Precautions / Restrictions Precautions Precautions: Fall Precaution Comments: Watch SpO2; bladder/bowel incontinence Restrictions Weight Bearing Restrictions: No       Mobility Bed Mobility Overal bed mobility: Needs Assistance Bed Mobility: Supine to Sit     Supine to sit: Mod assist;HOB elevated     General bed mobility comments: mod A to bring LEs off EOB and to elevate trunk.  Transfers Overall transfer level: Needs assistance Equipment used: Rolling walker (2 wheeled) Transfers: Sit to/from Stand Sit to Stand: Mod assist Stand pivot transfers: Mod assist       General transfer comment: assist to power up and to  steady, cues for hand placement; posterior lean    Balance Overall balance assessment: Needs assistance Sitting-balance support: Bilateral upper extremity supported;Feet supported Sitting balance-Leahy Scale: Fair Sitting balance - Comments: posterior leaning, min guard A for  balance/support, mod verbal cues to correct sitting posture Postural control: Posterior lean Standing balance support: Bilateral upper extremity supported Standing balance-Leahy Scale: Poor                             ADL either performed or assessed with clinical judgement   ADL Overall ADL's : Needs assistance/impaired Eating/Feeding: Set up;Supervision/ safety;Sitting   Grooming: Wash/dry hands;Wash/dry face;Sitting;Min guard           Upper Body Dressing : Minimal assistance;Sitting       Toilet Transfer: Stand-pivot;RW;Moderate assistance;Ambulation;Cueing for safety;Cueing for sequencing;BSC   Toileting- Clothing Manipulation and Hygiene: Total assistance;Sit to/from stand       Functional mobility during ADLs: Moderate assistance;Rolling walker;Cueing for safety;Cueing for sequencing General ADL Comments: Pt sat EOB for selfcare tasks but required min guard A for balance/support due to posterior leaning, verbal and physical cues to correct sitting posture     Vision Patient Visual Report: No change from baseline     Perception     Praxis      Cognition Arousal/Alertness: Awake/alert   Overall Cognitive Status: History of cognitive impairments - at baseline Area of Impairment: Problem solving;Awareness;Safety/judgement;Following commands;Attention                     Memory: Decreased short-term memory Following Commands: Follows one step commands with increased time Safety/Judgement: Decreased awareness of safety;Decreased awareness of deficits   Problem Solving: Slow processing;Requires verbal cues;Requires tactile cues;Decreased initiation General Comments: pt with baseline dementia per family report        Exercises  Shoulder Instructions       General Comments      Pertinent Vitals/ Pain       Pain Assessment: Faces Faces Pain Scale: Hurts a little bit Pain Location: generalized diring mobility Pain Descriptors /  Indicators: Grimacing Pain Intervention(s): Monitored during session;Repositioned  Home Living                                          Prior Functioning/Environment              Frequency  Min 2X/week        Progress Toward Goals  OT Goals(current goals can now be found in the care plan section)  Progress towards OT goals: Progressing toward goals  Acute Rehab OT Goals Patient Stated Goal: sister desires placement of pt  Plan Discharge plan remains appropriate    Co-evaluation                 AM-PAC OT "6 Clicks" Daily Activity     Outcome Measure   Help from another person eating meals?: None Help from another person taking care of personal grooming?: A Little Help from another person toileting, which includes using toliet, bedpan, or urinal?: Total Help from another person bathing (including washing, rinsing, drying)?: A Lot Help from another person to put on and taking off regular upper body clothing?: None Help from another person to put on and taking off regular lower body clothing?: Total 6 Click Score: 15    End of Session Equipment Utilized During Treatment: Gait belt;Rolling walker;Other (comment) (BSC)  OT Visit Diagnosis: Unsteadiness on feet (R26.81);Other abnormalities of gait and mobility (R26.89);Muscle weakness (generalized) (M62.81);Pain;Other symptoms and signs involving cognitive function Pain - part of body:  (generalized)   Activity Tolerance Patient tolerated treatment well   Patient Left with call bell/phone within reach;in chair;with chair alarm set   Nurse Communication Mobility status        Time:  -     Charges: OT General Charges $OT Visit: 1 Visit OT Treatments $Self Care/Home Management : 8-22 mins $Therapeutic Activity: 8-22 mins     Britt Bottom 01/17/2020, 2:54 PM

## 2020-01-17 NOTE — Progress Notes (Addendum)
      [address]       [place] [address]             [phone]         Subjective: Sleepy this morning. No pain.   Objective: Vital signs in last 24 hours: Temp:  [97.8 F (36.6 C)-99 F (37.2 C)] 99 F (37.2 C) (12/17 0555) Pulse Rate:  [73-77] 73 (12/17 0555) Resp:  [17-18] 18 (12/17 0555) BP: (116-120)/(69-73) 118/69 (12/17 0555) SpO2:  [99 %-100 %] 99 % (12/17 0555)     Intake/Output from previous day: 12/16 0701 - 12/17 0700 In: 697 [P.O.:597; IV Piggyback:100] Out: 700 [Urine:700] Intake/Output this shift: No intake/output data recorded.  General appearance: cooperative and no distress Heart: regular rate and rhythm, S1, S2 normal, no murmur, click, rub or gallop Lungs: clear to auscultation bilaterally Abdomen: soft, non-tender; bowel sounds normal; no masses,  no organomegaly Extremities: extremities normal, atraumatic, no cyanosis or edema Wound: clean and dry  Lab Results: Recent Labs    01/15/20 0112 01/17/20 0015  WBC 11.7* 12.1*  HGB 10.2* 10.3*  HCT 33.6* 32.2*  PLT 332 315   BMET:  Recent Labs    01/15/20 0112 01/16/20 0428  NA 139  --   K 4.4  --   CL 105  --   CO2 24  --   GLUCOSE 112*  --   BUN 25*  --   CREATININE 1.14 1.06  CALCIUM 8.7*  --     PT/INR: No results for input(s): LABPROT, INR in the last 72 hours. ABG    Component Value Date/Time   HCO3 33.4 (H) 11/01/2019 2257   TCO2 26 12/17/2007 0102   O2SAT 56.6 11/01/2019 2257   CBG (last 3)  No results for input(s): GLUCAP in the last 72 hours.  Assessment/Plan:  1. Chest tube: removed on 12/16. CXR this morning showed, no large pneumothorax. Continued persistent right base atelectasis/infiltrate and small right pleural effusion.  2. Pulm: tolerating room air with good oxygen saturation.  3. Renal: creatinine 1.14, electrolytes okay  Plan: CXR this morning stable. Will follow Mr. Goodbar peripherally. Please give [place] a call if needed.     LOS: 15 days    Elgie Collard 01/17/2020   CXR this am reviewed Small residual R pleural thickening/effusion needs no further followup  patient examined and medical record reviewed,agree with above note. Tharon Aquas Trigt III 01/17/2020

## 2020-01-18 DIAGNOSIS — Z9689 Presence of other specified functional implants: Secondary | ICD-10-CM | POA: Diagnosis not present

## 2020-01-18 DIAGNOSIS — J869 Pyothorax without fistula: Secondary | ICD-10-CM | POA: Diagnosis not present

## 2020-01-18 NOTE — Progress Notes (Signed)
PROGRESS NOTE   Nancy Manuele  NLZ:[id number]    DOB: September 04, 1940    DOA: 01/02/2020  PCP: [name], [name], MD   I have briefly reviewed patients previous medical records in [facility].  Chief complaint: Confusion and shortness of breath.  Brief Narrative:  79 year old male with history of recent CVA in 11/2019 with residual dysarthria, hypertension, hyperlipidemia and spinal stenosis, initially presented to [facility] from [facility]F with confusion and difficulty breathing.  Work-up revealed right-sided loculated pleural effusion with concern for empyema.  IR at [facility] attempted ultrasound-guided chest tube placement but were unable due to thickness of pleural fluid.  He was evaluated by general surgery at [facility] who recommended transfer to [facility] for thoracic surgery evaluation.  Patient was evaluated by thoracic surgery who recommended chest tube placement by IR, that was performed on 01/03/2020.  ID consulted and recommended transitioning from IV Zosyn to IV ceftriaxone and oral Flagyl while in-house and discharged on p.o. Augmentin for a total of 6 weeks with start date of 01/03/2020.  Right-sided chest tube removed 12/16. TCTS signed off 12/17 and advised no outpatient follow-up needed.   Assessment & Plan:  Active Problems:   Empyema of lung (HCC)   Severe sepsis without septic shock ([place])   Right-sided parapneumonic effusion/empyema:  Pigtail chest tube to right chest on 01/03/2020.  Chest tube output progressively decreasing, 20 mL documented for yesterday.  As per thoracic surgery follow-up, chest tube on waterseal (placed on waterseal 12/14), chest x-ray with improvement of right pleural effusion and awaiting MD decision regarding removing chest tube versus connecting to mini express to keep tube in at discharge.  Not a candidate for VATS surgery due to severe malnutrition, frailty and comorbidities.  Procalcitonin negative, CRP 2.4 > 2.2  Right  pleural fluid culture 01/03/2020: No growth aerobically or anaerobically.  Rocephin plus azithromycin >Zosyn 12/2 >ceftriaxone plus oral Flagyl per ID on 12/12  For ID, transition to oral Augmentin at discharge for a total of 6 weeks starting 01/03/2020  Chest tube was removed by TCTS on 12/16.  Chest x-ray 12/17: No pneumothorax.  Persistent small effusion and basilar atelectasis. TCTS signed off 12/17 and advised that no outpatient follow-up with them was needed.  Acute respiratory failure with hypoxia  Secondary to right-sided empyema.  Resolved.  Continue incentive spirometry and mobilization.  Acute kidney injury complicating stage II chronic kidney disease  Creatinine in the 0.8-0.9 range on 12/1 and 12/2 respectively  Creatinine peaked to 1.3, unclear etiology,?  Poor oral intake.  Losartan was discontinued on 12/10.  Creatinine down to 1.06.  AKI resolved.  Minimize nephrotoxic medications.  History of CVA in October 2021 with residual dysarthria  Stable.  Continue aspirin, Plavix and Crestor.  Essential hypertension:  Normotensive off of antihypertensives.  Losartan discontinued 12/10.  Monitor closely.  Anxiety and depression:  Stable on Cymbalta, continue  Dementia without behavioral disturbance:  Stable.  Oriented only to self today.  Delirium precautions.  As per discussion with sister at bedside on 12/17, patient's mental status currently at baseline  Debility/physical deconditioning  As per therapy recommendations, SNF when clinically cleared.  As per discussion with TOC team, will be difficult to place.   Anemia, suspect chronic disease  Stable  Leukocytosis/bandemia  Due to empyema  Almost resolved.  Goals of care  DNR/DNI established by prior providers on 01/13/2020 after discussing with patient's sister.  Could benefit from palliative care follow-up at SNF.  Body mass index is  21.13 kg/m.  Nutritional Status Nutrition  Problem: Severe Malnutrition Etiology: chronic illness (CVA) Signs/Symptoms: moderate fat depletion,severe fat depletion,moderate muscle depletion,severe muscle depletion Interventions: Ensure Enlive (each supplement provides 350kcal and 20 grams of protein),Magic cup,MVI    DVT prophylaxis: enoxaparin (LOVENOX) injection 40 mg Start: 01/03/20 1000     Code Status: DNR Family Communication: Discussed in detail with patient's sister at bedside on 12/17.  Updated care and answered questions. Disposition:  Status is: Inpatient  Remains inpatient appropriate because:Inpatient level of care appropriate due to severity of illness   Dispo:  Patient From:    Planned Disposition: [place].  As per discussion with TOC team, patient will be difficult to place  Expected discharge date: Patient is medically stable for DC to SNF as of 01/17/2020.  Medically stable for discharge:  Yes.         Consultants:   TCTS IR-signed off ID-signed off  Procedures:   Right-sided chest tube-removed 12/16.  Antimicrobials:    Anti-infectives (From admission, onward)   Start     Dose/Rate Route Frequency Ordered Stop   01/12/20 1400  metroNIDAZOLE (FLAGYL) tablet 500 mg        500 mg Oral Every 8 hours 01/12/20 1111     01/12/20 1300  cefTRIAXone (ROCEPHIN) 2 g in sodium chloride 0.9 % 100 mL IVPB        2 g 200 mL/hr over 30 Minutes Intravenous Every 24 hours 01/12/20 1111     01/02/20 2330  piperacillin-tazobactam (ZOSYN) IVPB 3.375 g  Status:  Discontinued        3.375 g 12.5 mL/hr over 240 Minutes Intravenous Every 8 hours 01/02/20 2242 01/12/20 1111        Subjective:  Patient stated he was feeling "extraordinarily good".  Denies complaints.  Objective:   Vitals:   01/17/20 1345 01/17/20 2026 01/18/20 0536 01/18/20 1324  BP: 125/69 125/68 129/73 119/72  Pulse: 76 90 76 91  Resp: 16 17  16   Temp: 98.7 F (37.1 C) 98 F (36.7 C) 98.9 F (37.2 C) 98.2 F (36.8 C)   TempSrc: Oral Oral Oral Oral  SpO2: 98% 100% 98% 96%  Weight:      Height:        General exam: Elderly male, moderately built, frail and chronically ill looking sitting up in bed eating breakfast. Respiratory system: Slightly diminished breath sounds in the bases but otherwise clear to auscultation without wheezing, rhonchi or crackles.  No increased work of breathing.  Stable without change. Cardiovascular system: S1 and S2 heard, RRR.  No JVD, pedal edema.  2/6 systolic murmur best heard at apex.  Stable. Gastrointestinal system: Abdomen is nondistended, soft and nontender. No organomegaly or masses felt. Normal bowel sounds heard. Central nervous system: Alert and oriented only to self.  Does not say much.  Smiles to questions.  No focal deficits. Extremities:?  Left upper extremity weakness, grade 4 x 5 power at least.  Rest of limbs grade 5 x 5 power. Skin: No rashes, lesions or ulcers Psychiatry: Judgement and insight impaired. Mood & affect pleasant.     Data Reviewed:   I have personally reviewed following labs and imaging studies   CBC: Recent Labs  Lab 01/13/20 0251 01/15/20 0112 01/17/20 0015  WBC 13.2* 11.7* 12.1*  NEUTROABS 10.0* 8.4* 8.7*  HGB 10.8* 10.2* 10.3*  HCT 34.9* 33.6* 32.2*  MCV 81.7 82.2 81.5  PLT 408* 332 557    Basic Metabolic Panel: Recent Labs  Lab 01/13/20 0251 01/15/20 0112 01/16/20 0428  NA 136 139  --   K 4.3 4.4  --   CL 104 105  --   CO2 25 24  --   GLUCOSE 109* 112*  --   BUN 25* 25*  --   CREATININE 1.31* 1.14 1.06  CALCIUM 8.6* 8.7*  --   MG 1.7 1.8  --   PHOS 3.0 3.0  --     Liver Function Tests: Recent Labs  Lab 01/13/20 0251 01/15/20 0112  ALBUMIN 2.1* 2.0*    CBG: No results for input(s): GLUCAP in the last 168 hours.  Microbiology Studies:  No results found for this or any previous visit (from the past 240 hour(s)).   Radiology Studies:  DG Chest 1 View  Result Date: 01/17/2020 CLINICAL DATA:  Chest  tube removal EXAM: CHEST  1 VIEW COMPARISON:  01/16/2020 FINDINGS: Right-sided chest tube is been removed in the interval. Small right pleural effusion is again identified as well as mild bibasilar atelectasis. No pneumothorax is seen. Skin fold is noted over the right chest. No bony abnormality is seen. Cardiac shadow is stable. IMPRESSION: No pneumothorax following chest tube removal. Persistent small effusion and basilar atelectasis is seen. Electronically Signed   By: Inez Catalina M.D.   On: 01/17/2020 08:07     Scheduled Meds:   . aspirin EC  81 mg Oral Daily  . clopidogrel  75 mg Oral Daily  . DULoxetine  60 mg Oral Daily  . enoxaparin (LOVENOX) injection  40 mg Subcutaneous Daily  . feeding supplement  237 mL Oral TID BM  . metroNIDAZOLE  500 mg Oral Q8H  . multivitamin with minerals  1 tablet Oral Daily  . rosuvastatin  10 mg Oral Daily    Continuous Infusions:   . cefTRIAXone (ROCEPHIN)  IV 2 g (01/18/20 0909)     LOS: 16 days     Vernell Leep, MD, [place], [facility]. [facility]    To contact the attending provider between 7A-7P or the covering provider during after hours 7P-7A, please log into the web site [email or web address] and access using universal [place] password for that web site. If you do not have the password, please call the hospital operator.  01/18/2020, 2:48 PM

## 2020-01-19 DIAGNOSIS — J869 Pyothorax without fistula: Secondary | ICD-10-CM | POA: Diagnosis not present

## 2020-01-19 DIAGNOSIS — Z9689 Presence of other specified functional implants: Secondary | ICD-10-CM | POA: Diagnosis not present

## 2020-01-19 LAB — CBC WITH DIFFERENTIAL/PLATELET
Abs Immature Granulocytes: 0.12 10*3/uL — ABNORMAL HIGH (ref 0.00–0.07)
Basophils Absolute: 0.1 10*3/uL (ref 0.0–0.1)
Basophils Relative: 1 %
Eosinophils Absolute: 0.3 10*3/uL (ref 0.0–0.5)
Eosinophils Relative: 2 %
HCT: 31.2 % — ABNORMAL LOW (ref 39.0–52.0)
Hemoglobin: 10 g/dL — ABNORMAL LOW (ref 13.0–17.0)
Immature Granulocytes: 1 %
Lymphocytes Relative: 12 %
Lymphs Abs: 1.6 10*3/uL (ref 0.7–4.0)
MCH: 26.4 pg (ref 26.0–34.0)
MCHC: 32.1 g/dL (ref 30.0–36.0)
MCV: 82.3 fL (ref 80.0–100.0)
Monocytes Absolute: 1.3 10*3/uL — ABNORMAL HIGH (ref 0.1–1.0)
Monocytes Relative: 10 %
Neutro Abs: 9.8 10*3/uL — ABNORMAL HIGH (ref 1.7–7.7)
Neutrophils Relative %: 74 %
Platelets: 281 10*3/uL (ref 150–400)
RBC: 3.79 MIL/uL — ABNORMAL LOW (ref 4.22–5.81)
RDW: 14.9 % (ref 11.5–15.5)
WBC: 13.2 10*3/uL — ABNORMAL HIGH (ref 4.0–10.5)
nRBC: 0 % (ref 0.0–0.2)

## 2020-01-19 NOTE — Progress Notes (Signed)
PROGRESS NOTE   [name] [name]  QBH:[id number]    DOB: 1940/12/14    DOA: 01/02/2020  PCP: [name], [name], MD   I have briefly reviewed patients previous medical records in [facility].  Chief complaint: Confusion and shortness of breath.  Brief Narrative:  79 year old male with history of recent CVA in 11/2019 with residual dysarthria, hypertension, hyperlipidemia and spinal stenosis, initially presented to [facility] from [facility]F with confusion and difficulty breathing.  Work-up revealed right-sided loculated pleural effusion with concern for empyema.  IR at [facility] attempted ultrasound-guided chest tube placement but were unable due to thickness of pleural fluid.  He was evaluated by general surgery at [facility] who recommended transfer to [facility] for thoracic surgery evaluation.  Patient was evaluated by thoracic surgery who recommended chest tube placement by IR, that was performed on 01/03/2020.  ID consulted and recommended transitioning from IV Zosyn to IV ceftriaxone and oral Flagyl while in-house and discharge on p.o. Augmentin for a total of 6 weeks with start date of 01/03/2020.  Right-sided chest tube removed 12/16. TCTS signed off 12/17 and advised no outpatient follow-up needed.  No acute events or issues reported by patient or RN.  Remains stable for DC pending safe disposition.  TOC team aware.   Assessment & Plan:  Active Problems:   Empyema of lung (HCC)   Severe sepsis without septic shock ([place])   Right-sided parapneumonic effusion/empyema:  Pigtail chest tube to right chest on 01/03/2020.  Chest tube output progressively decreasing, 20 mL documented for yesterday.  As per thoracic surgery follow-up, chest tube on waterseal (placed on waterseal 12/14), chest x-ray with improvement of right pleural effusion and awaiting MD decision regarding removing chest tube versus connecting to mini express to keep tube in at discharge.  Not a candidate for  VATS surgery due to severe malnutrition, frailty and comorbidities.  Procalcitonin negative, CRP 2.4 > 2.2  Right pleural fluid culture 01/03/2020: No growth aerobically or anaerobically.  Rocephin plus azithromycin >Zosyn 12/2 >ceftriaxone plus oral Flagyl per ID on 12/12  For ID, transition to oral Augmentin at discharge for a total of 6 weeks starting 01/03/2020  Chest tube was removed by TCTS on 12/16.  Chest x-ray 12/17: No pneumothorax.  Persistent small effusion and basilar atelectasis. TCTS signed off 12/17 and advised that no outpatient follow-up with them was needed.  Acute respiratory failure with hypoxia  Secondary to right-sided empyema.  Resolved.  Continue incentive spirometry and mobilization.  Acute kidney injury complicating stage II chronic kidney disease  Creatinine in the 0.8-0.9 range on 12/1 and 12/2 respectively  Creatinine peaked to 1.3, unclear etiology,?  Poor oral intake.  Losartan was discontinued on 12/10.  Creatinine down to 1.06.  AKI resolved.  Minimize nephrotoxic medications.  History of CVA in October 2021 with residual dysarthria  Stable.  Continue aspirin, Plavix and Crestor.  Essential hypertension:  Normotensive off of antihypertensives.  Losartan discontinued 12/10.  Monitor closely.  Anxiety and depression:  Stable on Cymbalta, continue  Dementia without behavioral disturbance:  Stable.  Oriented only to self today.  Delirium precautions.  As per discussion with sister at bedside on 12/17, patient's mental status currently at baseline  Debility/physical deconditioning  As per therapy recommendations, SNF when clinically cleared.  As per discussion with TOC team, will be difficult to place.   Anemia, suspect chronic disease  Stable/hemoglobin 10 on 12/19  Leukocytosis/bandemia  Due to empyema  Mild.  No clinical concerns.  Goals  of care  DNR/DNI established by prior providers on 01/13/2020 after  discussing with patient's sister.  Could benefit from palliative care follow-up at SNF.  Body mass index is 21.13 kg/m.  Nutritional Status Nutrition Problem: Severe Malnutrition Etiology: chronic illness (CVA) Signs/Symptoms: moderate fat depletion,severe fat depletion,moderate muscle depletion,severe muscle depletion Interventions: Ensure Enlive (each supplement provides 350kcal and 20 grams of protein),Magic cup,MVI    DVT prophylaxis: enoxaparin (LOVENOX) injection 40 mg Start: 01/03/20 1000     Code Status: DNR Family Communication: Discussed in detail with patient's sister at bedside on 12/17.  Updated care and answered questions.  None at bedside today Disposition:  Status is: Inpatient  Remains inpatient appropriate because:Inpatient level of care appropriate due to severity of illness   Dispo:  Patient From:    Planned Disposition: [place].  As per discussion with TOC team, patient will be difficult to place  Expected discharge date: Patient is medically stable for DC to SNF as of 01/17/2020.  Medically stable for discharge:  Yes.         Consultants:   TCTS IR-signed off ID-signed off  Procedures:   Right-sided chest tube-removed 12/16.  Antimicrobials:    Anti-infectives (From admission, onward)   Start     Dose/Rate Route Frequency Ordered Stop   01/12/20 1400  metroNIDAZOLE (FLAGYL) tablet 500 mg        500 mg Oral Every 8 hours 01/12/20 1111     01/12/20 1300  cefTRIAXone (ROCEPHIN) 2 g in sodium chloride 0.9 % 100 mL IVPB        2 g 200 mL/hr over 30 Minutes Intravenous Every 24 hours 01/12/20 1111     01/02/20 2330  piperacillin-tazobactam (ZOSYN) IVPB 3.375 g  Status:  Discontinued        3.375 g 12.5 mL/hr over 240 Minutes Intravenous Every 8 hours 01/02/20 2242 01/12/20 1111        Subjective:  Patient denies complaints.  As per RN, no acute issues noted.  Objective:   Vitals:   01/18/20 1324 01/18/20 1915 01/18/20  2038 01/19/20 0539  BP: 119/72 121/73 123/79 123/72  Pulse: 91 90 85 73  Resp: 16 17 18 20   Temp: 98.2 F (36.8 C) 98.1 F (36.7 C) 97.9 F (36.6 C) 98.9 F (37.2 C)  TempSrc: Oral Oral Oral Oral  SpO2: 96% 97% 99% 97%  Weight:      Height:        General exam: Elderly male, moderately built, frail and chronically ill looking sitting up in bed.  Intermittently dozing off with breakfast tray in front of him. Respiratory system: Slightly diminished breath sounds in the bases but otherwise clear to auscultation without wheezing, rhonchi or crackles.  No increased work of breathing.  Stable without change. Cardiovascular system: S1 and S2 heard, RRR.  No JVD, pedal edema.  2/6 systolic murmur best heard at apex.  Stable. Gastrointestinal system: Abdomen is nondistended, soft and nontender. No organomegaly or masses felt. Normal bowel sounds heard. Central nervous system: Woke up easily.  Alert and oriented only to self.  No focal deficits Extremities:?  Left upper extremity weakness, grade 4 x 5 power at least.  Rest of limbs grade 5 x 5 power. Skin: No rashes, lesions or ulcers Psychiatry: Judgement and insight impaired. Mood & affect pleasant.     Data Reviewed:   I have personally reviewed following labs and imaging studies   CBC: Recent Labs  Lab 01/15/20 0112 01/17/20 0015 01/19/20  0134  WBC 11.7* 12.1* 13.2*  NEUTROABS 8.4* 8.7* 9.8*  HGB 10.2* 10.3* 10.0*  HCT 33.6* 32.2* 31.2*  MCV 82.2 81.5 82.3  PLT 332 315 128    Basic Metabolic Panel: Recent Labs  Lab 01/13/20 0251 01/15/20 0112 01/16/20 0428  NA 136 139  --   K 4.3 4.4  --   CL 104 105  --   CO2 25 24  --   GLUCOSE 109* 112*  --   BUN 25* 25*  --   CREATININE 1.31* 1.14 1.06  CALCIUM 8.6* 8.7*  --   MG 1.7 1.8  --   PHOS 3.0 3.0  --     Liver Function Tests: Recent Labs  Lab 01/13/20 0251 01/15/20 0112  ALBUMIN 2.1* 2.0*    CBG: No results for input(s): GLUCAP in the last 168  hours.  Microbiology Studies:  No results found for this or any previous visit (from the past 240 hour(s)).   Radiology Studies:  No results found.   Scheduled Meds:   . aspirin EC  81 mg Oral Daily  . clopidogrel  75 mg Oral Daily  . DULoxetine  60 mg Oral Daily  . enoxaparin (LOVENOX) injection  40 mg Subcutaneous Daily  . feeding supplement  237 mL Oral TID BM  . metroNIDAZOLE  500 mg Oral Q8H  . multivitamin with minerals  1 tablet Oral Daily  . rosuvastatin  10 mg Oral Daily    Continuous Infusions:   . cefTRIAXone (ROCEPHIN)  IV 2 g (01/19/20 0846)     LOS: 17 days     Vernell Leep, MD, [place], [facility]. [facility]    To contact the attending provider between 7A-7P or the covering provider during after hours 7P-7A, please log into the web site [email or web address] and access using universal [place] password for that web site. If you do not have the password, please call the hospital operator.  01/19/2020, 1:32 PM

## 2020-01-20 DIAGNOSIS — J869 Pyothorax without fistula: Secondary | ICD-10-CM | POA: Diagnosis not present

## 2020-01-20 DIAGNOSIS — Z9689 Presence of other specified functional implants: Secondary | ICD-10-CM | POA: Diagnosis not present

## 2020-01-20 NOTE — Progress Notes (Addendum)
PROGRESS NOTE   [name] [name]  NOB:[id number]    DOB: March 02, 1940    DOA: 01/02/2020  PCP: [name], [name], MD   I have briefly reviewed patients previous medical records in [facility].  Chief complaint: Confusion and shortness of breath.  Brief Narrative:  79 year old male with history of recent CVA in 11/2019 with residual dysarthria, hypertension, hyperlipidemia and spinal stenosis, initially presented to [facility] from [facility] with confusion and difficulty breathing.  Work-up revealed right-sided loculated pleural effusion with concern for empyema.  IR at [facility] attempted ultrasound-guided chest tube placement but were unable due to thickness of pleural fluid.  He was evaluated by general surgery at [facility] who recommended transfer to [facility] for thoracic surgery evaluation.  Patient was evaluated by thoracic surgery who recommended chest tube placement by IR, that was performed on 01/03/2020.  ID consulted and recommended transitioning from IV Zosyn to IV ceftriaxone and oral Flagyl while in-house and discharge on p.o. Augmentin for a total of 6 weeks with start date of 01/03/2020.  Right-sided chest tube removed 12/16. TCTS signed off 12/17 and advised no outpatient follow-up needed.  No acute events or issues reported by patient or RN.  Remains stable for DC pending safe disposition.  TOC team aware and reportedly had difficult to place patient.   Assessment & Plan:  Active Problems:   Empyema of lung ([place])    Right-sided parapneumonic effusion/empyema:  Pigtail chest tube to right chest on 01/03/2020.  Chest tube output progressively decreasing, 20 mL documented for yesterday.  As per thoracic surgery follow-up, chest tube on waterseal (placed on waterseal 12/14), chest x-ray with improvement of right pleural effusion and awaiting MD decision regarding removing chest tube versus connecting to mini express to keep tube in at discharge.  Not a candidate for  VATS surgery due to severe malnutrition, frailty and comorbidities.  Procalcitonin negative, CRP 2.4 > 2.2  Right pleural fluid culture 01/03/2020: No growth aerobically or anaerobically.  Rocephin plus azithromycin >Zosyn 12/2 >ceftriaxone plus oral Flagyl per ID on 12/12  For ID, transition to oral Augmentin at discharge for a total of 6 weeks starting 01/03/2020  Chest tube was removed by TCTS on 12/16.  Chest x-ray 12/17: No pneumothorax.  Persistent small effusion and basilar atelectasis.   TCTS signed off 12/17 and advised that no outpatient follow-up with them was needed.  Improved and stable.  Acute respiratory failure with hypoxia  Secondary to right-sided empyema.  Resolved.  Continue incentive spirometry and mobilization.  Acute kidney injury complicating stage II chronic kidney disease  Creatinine in the 0.8-0.9 range on 12/1 and 12/2 respectively  Creatinine peaked to 1.3, unclear etiology,?  Poor oral intake.  Losartan was discontinued on 12/10.  Creatinine down to 1.06.  AKI resolved.  Minimize nephrotoxic medications.  Periodically follow BMP while on antibiotics.  Will request for a.m.  History of CVA in October 2021 with residual dysarthria  Stable.  Continue aspirin, Plavix and Crestor.  Essential hypertension:  Normotensive off of antihypertensives.  Losartan discontinued 12/10.  Monitor closely.  Anxiety and depression:  Stable on Cymbalta, continue  Dementia without behavioral disturbance:  Stable.  Oriented only to self today.  Delirium precautions.  As per discussion with sister at bedside on 12/17, patient's mental status currently at baseline  Debility/physical deconditioning  As per therapy recommendations, SNF when clinically cleared.  As per discussion with TOC team, will be difficult to place.   Anemia, suspect chronic disease  Stable/hemoglobin 10 on 12/19  Leukocytosis/bandemia  Due to empyema  Mild.  No  clinical concerns.  Goals of care  DNR/DNI established by prior providers on 01/13/2020 after discussing with patient's sister.  Could benefit from palliative care follow-up at SNF.  Body mass index is 21.13 kg/m.  Nutritional Status Nutrition Problem: Severe Malnutrition Etiology: chronic illness (CVA) Signs/Symptoms: moderate fat depletion,severe fat depletion,moderate muscle depletion,severe muscle depletion Interventions: Ensure Enlive (each supplement provides 350kcal and 20 grams of protein),Magic cup,MVI    DVT prophylaxis: enoxaparin (LOVENOX) injection 40 mg Start: 01/03/20 1000     Code Status: DNR Family Communication: Discussed in detail with patient's sister at bedside on 12/17.  Updated care and answered questions.  None at bedside today Disposition:  Status is: Inpatient  Remains inpatient appropriate because:Inpatient level of care appropriate due to severity of illness   Dispo:  Patient From:    Planned Disposition:  .  As per discussion with TOC team, patient will be difficult to place  Expected discharge date: Patient is medically stable for DC to SNF as of 01/17/2020.  Medically stable for discharge:  Yes.         Consultants:   TCTS IR-signed off ID-signed off  Procedures:   Right-sided chest tube-removed 12/16.  Antimicrobials:    Anti-infectives (From admission, onward)   Start     Dose/Rate Route Frequency Ordered Stop   01/12/20 1400  metroNIDAZOLE (FLAGYL) tablet 500 mg        500 mg Oral Every 8 hours 01/12/20 1111     01/12/20 1300  cefTRIAXone (ROCEPHIN) 2 g in sodium chloride 0.9 % 100 mL IVPB        2 g 200 mL/hr over 30 Minutes Intravenous Every 24 hours 01/12/20 1111     01/02/20 2330  piperacillin-tazobactam (ZOSYN) IVPB 3.375 g  Status:  Discontinued        3.375 g 12.5 mL/hr over 240 Minutes Intravenous Every 8 hours 01/02/20 2242 01/12/20 1111        Subjective:  Patient had eaten some of his breakfast and was  taking a nap.  Easily arousable.  Poor historian for the last several days caring for him.  Does not say much.  Most times says he feels "pretty good".  Denies pain or dyspnea.  As per RN, no acute issues noted.  Objective:   Vitals:   01/19/20 1435 01/19/20 1958 01/20/20 0311 01/20/20 1427  BP: 107/65 110/67 109/64 111/82  Pulse: 65 70 71 93  Resp: 20 18 18 18   Temp: (!) 97.5 F (36.4 C) 98 F (36.7 C) 98.1 F (36.7 C) (!) 97.5 F (36.4 C)  TempSrc: Oral Oral Oral   SpO2: 98% 98% 98% 98%  Weight:      Height:        General exam: Elderly male, moderately built, frail and chronically ill looking sitting up in bed.  Intermittently dozing off with breakfast tray in front of him. Respiratory system: Clear to auscultation.  No increased work of breathing. Cardiovascular system: S1 and S2 heard, RRR.  No JVD, pedal edema.  2/6 systolic murmur best heard at apex.  Stable. Gastrointestinal system: Abdomen is nondistended, soft and nontender. No organomegaly or masses felt. Normal bowel sounds heard. Central nervous system: Woke up easily.  Alert and oriented only to self.  No focal deficits Extremities:?  Left upper extremity weakness, grade 4 x 5 power at least.  Rest of limbs grade 5 x 5 power. Skin: No  rashes, lesions or ulcers Psychiatry: Judgement and insight impaired. Mood & affect pleasant.     Data Reviewed:   I have personally reviewed following labs and imaging studies   CBC: Recent Labs  Lab 01/15/20 0112 01/17/20 0015 01/19/20 0134  WBC 11.7* 12.1* 13.2*  NEUTROABS 8.4* 8.7* 9.8*  HGB 10.2* 10.3* 10.0*  HCT 33.6* 32.2* 31.2*  MCV 82.2 81.5 82.3  PLT 332 315 370    Basic Metabolic Panel: Recent Labs  Lab 01/15/20 0112 01/16/20 0428  NA 139  --   K 4.4  --   CL 105  --   CO2 24  --   GLUCOSE 112*  --   BUN 25*  --   CREATININE 1.14 1.06  CALCIUM 8.7*  --   MG 1.8  --   PHOS 3.0  --     Liver Function Tests: Recent Labs  Lab 01/15/20 0112   ALBUMIN 2.0*    CBG: No results for input(s): GLUCAP in the last 168 hours.  Microbiology Studies:  No results found for this or any previous visit (from the past 240 hour(s)).   Radiology Studies:  No results found.   Scheduled Meds:   . aspirin EC  81 mg Oral Daily  . clopidogrel  75 mg Oral Daily  . DULoxetine  60 mg Oral Daily  . enoxaparin (LOVENOX) injection  40 mg Subcutaneous Daily  . feeding supplement  237 mL Oral TID BM  . metroNIDAZOLE  500 mg Oral Q8H  . multivitamin with minerals  1 tablet Oral Daily  . rosuvastatin  10 mg Oral Daily    Continuous Infusions:   . cefTRIAXone (ROCEPHIN)  IV 2 g (01/20/20 0805)     LOS: 18 days     Vernell Leep, MD, [place], [facility]. [facility]    To contact the attending provider between 7A-7P or the covering provider during after hours 7P-7A, please log into the web site [email or web address] and access using universal [place] password for that web site. If you do not have the password, please call the hospital operator.  01/20/2020, 4:29 PM

## 2020-01-20 NOTE — Progress Notes (Signed)
Physical Therapy Treatment Patient Details Name: [name] [name] MRN: [id number] DOB: [birth year] Today's Date: 01/20/2020    History of Present Illness Pt is a 79 y.o. male (from SNF) admitted 01/02/20 as transfer from [facility] (admitted there 12/26/19) for CTS evaluation for possible VATS decortication. Workup revealed R-side loculated pleural efffusion with concern for empyema, possible PNA. PMH includes lumbar stenosis, renal disorder.    PT Comments    Continuing work on functional mobility and activity tolerance;  Session initially focused on progressive amb, however pt was incontinent of bowel,  So opted to get to bathroom for cleaning up; Continues to require considerable assist for power up for sit to stand, and for balance during amb; Movement is slow, and almost bradykinetic; Slow, but notable progress -- goal update complete  Follow Up Recommendations  SNF     Equipment Recommendations  Rolling walker with 5" wheels;3in1 (PT)    Recommendations for Other Services       Precautions / Restrictions Precautions Precautions: Fall Precaution Comments: bladder and bowel incontinence    Mobility  Bed Mobility Overal bed mobility: Needs Assistance Bed Mobility: Supine to Sit     Supine to sit: Mod assist;HOB elevated     General bed mobility comments: mod A to bring LEs off EOB and to elevate trunk.  Transfers Overall transfer level: Needs assistance Equipment used: Rolling walker (2 wheeled) Transfers: Sit to/from Stand Sit to Stand: Mod assist         General transfer comment: assist to power up and to  steady, cues for hand placement; stood from bed, bedside commode, and recliner  Ambulation/Gait Ambulation/Gait assistance: Min assist;Mod assist Gait Distance (Feet): 10 Feet (x2) Assistive device: Rolling walker (2 wheeled) Gait Pattern/deviations: Step-to pattern;Trunk flexed Gait velocity: Decreased   General Gait Details: walked into bathroom and  after a brief washup, bathroom to recliner; tending to keep hips and knees slightly bent throughout gait cycle; distance limited by incontinence of bowels   Stairs             Wheelchair Mobility    Modified Rankin (Stroke Patients Only)       Balance     Sitting balance-Leahy Scale: Fair       Standing balance-Leahy Scale: Poor                              Cognition Arousal/Alertness: Awake/alert Behavior During Therapy: Flat affect Overall Cognitive Status: History of cognitive impairments - at baseline                         Following Commands: Follows one step commands with increased time Safety/Judgement: Decreased awareness of safety;Decreased awareness of deficits Awareness: Intellectual Problem Solving: Slow processing;Requires verbal cues;Requires tactile cues;Decreased initiation General Comments: pt with baseline dementia per family report      Exercises      General Comments General comments (skin integrity, edema, etc.): Session conducted on Room air; mild DOE      Pertinent Vitals/Pain Pain Assessment: No/denies pain Pain Intervention(s): Monitored during session    Home Living                      Prior Function            PT Goals (current goals can now be found in the care plan section) Acute Rehab PT Goals Patient Stated Goal: Did not specifically  state; agrees to walking to bathroom and getting washed up PT Goal Formulation: With patient/family Time For Goal Achievement: 02/03/20 Potential to Achieve Goals: Fair Progress towards PT goals: Progressing toward goals (Goals updated with minor tweaks, see care plan)    Frequency    Min 2X/week      PT Plan Current plan remains appropriate    Co-evaluation              AM-PAC PT "6 Clicks" Mobility   Outcome Measure  Help needed turning from your back to your side while in a flat bed without using bedrails?: A Little Help needed moving  from lying on your back to sitting on the side of a flat bed without using bedrails?: A Lot Help needed moving to and from a bed to a chair (including a wheelchair)?: A Lot Help needed standing up from a chair using your arms (e.g., wheelchair or bedside chair)?: A Lot Help needed to walk in hospital room?: A Lot Help needed climbing 3-5 steps with a railing? : A Lot 6 Click Score: 13    End of Session Equipment Utilized During Treatment: Gait belt Activity Tolerance: Patient tolerated treatment well Patient left: in chair;with chair alarm set;with nursing/sitter in room;Other (comment) (NT to assist pt wth cleanup and putting on Primofit) Nurse Communication: Mobility status PT Visit Diagnosis: Difficulty in walking, not elsewhere classified (R26.2);Muscle weakness (generalized) (M62.81)     Time: 1256-1330 PT Time Calculation (min) (ACUTE ONLY): 34 min  Charges:  $Gait Training: 8-22 mins $Therapeutic Activity: 8-22 mins                     Roney Marion, PT  Acute Rehabilitation Services Pager [phone] Office [place] 01/20/2020, 2:14 PM

## 2020-01-20 NOTE — TOC Progression Note (Signed)
Transition of Care [facility]) - Progression Note    Patient Details  Name: [name] [name] MRN: [id number] Date of Birth: 1940-11-01  Transition of Care [facility]) CM/SW [place], [place] Phone Number: 01/20/2020, 12:46 PM  Clinical Narrative:     CSW spoke to [name] [name] with financial counseling. Ms Joya [name] stated they will screen pt for long term medicaid. Pt is only oriented to Person and pts, sister [name] [name] is the best contact to answer questions.     Barriers to Discharge: Continued Medical Work up,SNF Pending bed offer  Expected Discharge Plan and Services         Living arrangements for the past 2 months: Protection                                       Social Determinants of Health (SDOH) Interventions    Readmission Risk Interventions No flowsheet data found.  Emeterio Reeve, Latanya Presser, [address] Social Worker [phone]

## 2020-01-21 DIAGNOSIS — Z9889 Other specified postprocedural states: Secondary | ICD-10-CM

## 2020-01-21 DIAGNOSIS — Z9689 Presence of other specified functional implants: Secondary | ICD-10-CM | POA: Diagnosis not present

## 2020-01-21 DIAGNOSIS — J869 Pyothorax without fistula: Secondary | ICD-10-CM | POA: Diagnosis not present

## 2020-01-21 LAB — BASIC METABOLIC PANEL
Anion gap: 10 (ref 5–15)
BUN: 22 mg/dL (ref 8–23)
CO2: 25 mmol/L (ref 22–32)
Calcium: 8.3 mg/dL — ABNORMAL LOW (ref 8.9–10.3)
Chloride: 103 mmol/L (ref 98–111)
Creatinine, Ser: 1.09 mg/dL (ref 0.61–1.24)
GFR, Estimated: >60 mL/min (ref >60)
Glucose, Bld: 103 mg/dL — ABNORMAL HIGH (ref 70–99)
Potassium: 4.6 mmol/L (ref 3.5–5.1)
Sodium: 138 mmol/L (ref 135–145)

## 2020-01-21 LAB — CBC WITH DIFFERENTIAL/PLATELET
Abs Immature Granulocytes: 0.11 10*3/uL — ABNORMAL HIGH (ref 0.00–0.07)
Basophils Absolute: 0.1 10*3/uL (ref 0.0–0.1)
Basophils Relative: 1 %
Eosinophils Absolute: 0.2 10*3/uL (ref 0.0–0.5)
Eosinophils Relative: 2 %
HCT: 33.6 % — ABNORMAL LOW (ref 39.0–52.0)
Hemoglobin: 10.7 g/dL — ABNORMAL LOW (ref 13.0–17.0)
Immature Granulocytes: 1 %
Lymphocytes Relative: 14 %
Lymphs Abs: 1.6 10*3/uL (ref 0.7–4.0)
MCH: 26 pg (ref 26.0–34.0)
MCHC: 31.8 g/dL (ref 30.0–36.0)
MCV: 81.8 fL (ref 80.0–100.0)
Monocytes Absolute: 1.4 10*3/uL — ABNORMAL HIGH (ref 0.1–1.0)
Monocytes Relative: 11 %
Neutro Abs: 8.8 10*3/uL — ABNORMAL HIGH (ref 1.7–7.7)
Neutrophils Relative %: 71 %
Platelets: 292 10*3/uL (ref 150–400)
RBC: 4.11 MIL/uL — ABNORMAL LOW (ref 4.22–5.81)
RDW: 15.5 % (ref 11.5–15.5)
WBC: 12.2 10*3/uL — ABNORMAL HIGH (ref 4.0–10.5)
nRBC: 0 % (ref 0.0–0.2)

## 2020-01-21 NOTE — Plan of Care (Signed)
  Problem: Education: Goal: Knowledge of General Education information will improve Description Including pain rating scale, medication(s)/side effects and non-pharmacologic comfort measures Outcome: Progressing   Problem: Clinical Measurements: Goal: Ability to maintain clinical measurements within normal limits will improve Outcome: Progressing Goal: Respiratory complications will improve Outcome: Progressing   Problem: Pain Managment: Goal: General experience of comfort will improve Outcome: Progressing   Problem: Skin Integrity: Goal: Risk for impaired skin integrity will decrease Outcome: Progressing   

## 2020-01-21 NOTE — Progress Notes (Signed)
PROGRESS NOTE  [name] [name] ZMO:[id number] DOB: [birth year] DOA: 01/02/2020 PCP: [name], [name], MD   LOS: 19 days   Brief Narrative / Interim history: 79 year old male with history of recent CVA in 11/2019 with residual dysarthria, hypertension, hyperlipidemia and spinal stenosis, initially presented to [facility] from [facility] SNF with confusion and difficulty breathing.  Work-up revealed right-sided loculated pleural effusion with concern for empyema.  IR at [facility] attempted ultrasound-guided chest tube placement but were unable due to thickness of pleural fluid.  He was evaluated by general surgery at [facility] who recommended transfer to [facility] for thoracic surgery evaluation. Patient was evaluated by thoracic surgery who recommended chest tube placement by IR, that was performed on 01/03/2020.  ID consulted and recommended transitioning from IV Zosyn to IV ceftriaxone and oral Flagyl while in-house and discharge on p.o. Augmentin for a total of 6 weeks with start date of 01/03/2020. Right-sided chest tube removed 12/16. TCTS signed off 12/17 and advised no outpatient follow-up needed.  No acute events or issues reported by patient or RN.  Remains stable for DC pending safe disposition.  TOC team aware and reportedly had difficult to place patient.  Apparently he needs long-term care   Subjective / 24h Interval events: No complaints this morning, feeling weak, denies any chest pain, denies any shortness of breath.  Assessment & Plan: Principal Problem Right-sided parapneumonic effusion, empyema - TCV consulted and followed patient while hospitalized.  He underwent a pigtail chest tube on 01/03/2020, output was monitored and eventually it was removed on 12/16.  Follow-up chest x-ray 12/17 without pneumothorax and overall stable.  Thoracic surgery signed off 12/17, no outpatient follow-up as needed.  Currently he is on antibiotics with ceftriaxone and metronidazole.  Active  Problems Acute hypoxic respiratory failure-secondary to right-sided empyema, resolved Acute kidney injury on chronic kidney disease stage II-resolved History of CVA in October 2021 with residual dysarthria-stable, continue aspirin, Plavix, Crestor Essential hypertension-he is normotensive off his blood pressure medications, keep off for now Dementia, no behavioral disturbances-stable, oriented to self.  Per family he is at baseline Debility/physical deconditioning-SNF pending Anemia of chronic disease-hb overall stable  Scheduled Meds: . aspirin EC  81 mg Oral Daily  . clopidogrel  75 mg Oral Daily  . DULoxetine  60 mg Oral Daily  . enoxaparin (LOVENOX) injection  40 mg Subcutaneous Daily  . feeding supplement  237 mL Oral TID BM  . metroNIDAZOLE  500 mg Oral Q8H  . multivitamin with minerals  1 tablet Oral Daily  . rosuvastatin  10 mg Oral Daily   Continuous Infusions: . cefTRIAXone (ROCEPHIN)  IV 2 g (01/21/20 0936)   PRN Meds:.acetaminophen, ondansetron (ZOFRAN) IV  Diet Orders (From admission, onward)    Start     Ordered   01/06/20 1033  Diet regular Room service appropriate? Yes; Fluid consistency: Thin  Diet effective now       Question Answer Comment  Room service appropriate? Yes   Fluid consistency: Thin      01/06/20 1032          DVT prophylaxis: enoxaparin (LOVENOX) injection 40 mg Start: 01/03/20 1000     Code Status: DNR  Family Communication: no family at bedside   Status is: Inpatient  Remains inpatient appropriate because:Inpatient level of care appropriate due to severity of illness   Dispo:  Patient From:    Planned Disposition:    Expected discharge date: 01/28/2020  Medically stable for discharge:     Consultants:  TCV  Procedures:  Chest tube  Microbiology  Negative cultures  Antimicrobials: Ceftriaxone / Azithromycin     Objective: Vitals:   01/20/20 0311 01/20/20 1427 01/20/20 2054 01/21/20 0559  BP: 109/64 111/82 130/75  134/72  Pulse: 71 93 75 74  Resp: 18 18 16 16   Temp: 98.1 F (36.7 C) (!) 97.5 F (36.4 C) 99 F (37.2 C) 98.6 F (37 C)  TempSrc: Oral Oral Oral Oral  SpO2: 98% 98% 97% 96%  Weight:      Height:        Intake/Output Summary (Last 24 hours) at 01/21/2020 1303 Last data filed at 01/21/2020 1013 Gross per 24 hour  Intake 120 ml  Output 600 ml  Net -480 ml   Filed Weights   01/03/20 1414  Weight: 66.8 kg    Examination:  Constitutional: NAD Eyes: no scleral icterus ENMT: Mucous membranes are moist.  Neck: normal, supple Respiratory: clear to auscultation bilaterally, no wheezing, no crackles. Normal respiratory effort.   Cardiovascular: Regular rate and rhythm, no murmurs / rubs / gallops. No LE edema.  Abdomen: non distended, no tenderness. Bowel sounds positive.  Musculoskeletal: no clubbing / cyanosis.  Skin: no rashes Neurologic: non focal   Data Reviewed: I have independently reviewed following labs and imaging studies   CBC: Recent Labs  Lab 01/15/20 0112 01/17/20 0015 01/19/20 0134 01/21/20 0055  WBC 11.7* 12.1* 13.2* 12.2*  NEUTROABS 8.4* 8.7* 9.8* 8.8*  HGB 10.2* 10.3* 10.0* 10.7*  HCT 33.6* 32.2* 31.2* 33.6*  MCV 82.2 81.5 82.3 81.8  PLT 332 315 281 591   Basic Metabolic Panel: Recent Labs  Lab 01/15/20 0112 01/16/20 0428 01/21/20 0055  NA 139  --  138  K 4.4  --  4.6  CL 105  --  103  CO2 24  --  25  GLUCOSE 112*  --  103*  BUN 25*  --  22  CREATININE 1.14 1.06 1.09  CALCIUM 8.7*  --  8.3*  MG 1.8  --   --   PHOS 3.0  --   --    Liver Function Tests: Recent Labs  Lab 01/15/20 0112  ALBUMIN 2.0*   Coagulation Profile: No results for input(s): INR, PROTIME in the last 168 hours. HbA1C: No results for input(s): HGBA1C in the last 72 hours. CBG: No results for input(s): GLUCAP in the last 168 hours.  No results found for this or any previous visit (from the past 240 hour(s)).   Radiology Studies: No results found.   Marzetta Board, MD, PhD [facility]  Between 7 am - 7 pm I am available, please contact me via Amion or Securechat  Between 7 pm - 7 am I am not available, please contact night coverage MD/APP via Amion

## 2020-01-21 NOTE — Progress Notes (Signed)
Occupational Therapy Treatment Patient Details Name: [name] [name] MRN: [id number] DOB: [birth year] Today's Date: 01/21/2020    History of present illness Pt is a 79 y.o. male (from SNF) admitted 01/02/20 as transfer from [facility] (admitted there 12/26/19) for CTS evaluation for possible VATS decortication. Workup revealed R-side loculated pleural efffusion with concern for empyema, possible PNA. PMH includes lumbar stenosis, renal disorder.   OT comments  Pt making progress with functional goals, is pleasant and cooperative. OT will continue to follow acutely to [place] level of function and safety  Follow Up Recommendations  SNF;Supervision/Assistance - 24 hour    Equipment Recommendations    TBD at next venue of care   Recommendations for Other Services      Precautions / Restrictions Precautions Precautions: Fall Precaution Comments: bladder and bowel incontinence Restrictions Weight Bearing Restrictions: No       Mobility Bed Mobility Overal bed mobility: Needs Assistance Bed Mobility: Supine to Sit     Supine to sit: Mod assist;HOB elevated     General bed mobility comments: mod A to bring LEs off EOB and to elevate trunk.  Transfers Overall transfer level: Needs assistance Equipment used: Rolling walker (2 wheeled) Transfers: Sit to/from Stand Sit to Stand: Mod assist         General transfer comment: assist to power up and to  steady, cues for hand placement; stood from bed, bedside commode, and recliner    Balance Overall balance assessment: Needs assistance Sitting-balance support: Bilateral upper extremity supported;Feet supported Sitting balance-Leahy Scale: Fair     Standing balance support: Bilateral upper extremity supported Standing balance-Leahy Scale: Poor                             ADL either performed or assessed with clinical judgement   ADL Overall ADL's : Needs assistance/impaired     Grooming: Wash/dry  hands;Wash/dry face;Sitting;Minimal assistance   Upper Body Bathing: Sitting;Minimal assistance Upper Body Bathing Details (indicate cue type and reason): simulated seated EOB             Toilet Transfer: Stand-pivot;RW;Moderate assistance;Ambulation;Cueing for safety;Cueing for sequencing;BSC           Functional mobility during ADLs: Moderate assistance;Rolling walker;Cueing for safety;Cueing for sequencing General ADL Comments: Pt sat EOB for selfcare tasks but required min guard A for balance/support due to posterior leaning, verbal and physical cues to correct sitting posture     Vision Patient Visual Report: No change from baseline     Perception     Praxis      Cognition Arousal/Alertness: Awake/alert Behavior During Therapy: Flat affect Overall Cognitive Status: History of cognitive impairments - at baseline Area of Impairment: Problem solving;Awareness;Safety/judgement;Following commands;Attention                       Following Commands: Follows one step commands with increased time Safety/Judgement: Decreased awareness of safety;Decreased awareness of deficits   Problem Solving: Slow processing;Requires verbal cues;Requires tactile cues;Decreased initiation General Comments: pt with baseline dementia per family report        Exercises     Shoulder Instructions       General Comments      Pertinent Vitals/ Pain       Pain Assessment: No/denies pain Pain Score: 0-No pain Pain Intervention(s): Monitored during session  Home Living  Prior Functioning/Environment              Frequency  Min 2X/week        Progress Toward Goals  OT Goals(current goals can now be found in the care plan section)  Progress towards OT goals: Progressing toward goals     Plan Discharge plan remains appropriate    Co-evaluation                 AM-PAC OT "6 Clicks" Daily Activity      Outcome Measure   Help from another person eating meals?: None Help from another person taking care of personal grooming?: A Little Help from another person toileting, which includes using toliet, bedpan, or urinal?: Total Help from another person bathing (including washing, rinsing, drying)?: A Lot Help from another person to put on and taking off regular upper body clothing?: A Little Help from another person to put on and taking off regular lower body clothing?: Total 6 Click Score: 14    End of Session Equipment Utilized During Treatment: Gait belt;Rolling walker;Other (comment) (BSC)  OT Visit Diagnosis: Unsteadiness on feet (R26.81);Other abnormalities of gait and mobility (R26.89);Muscle weakness (generalized) (M62.81);Pain;Other symptoms and signs involving cognitive function   Activity Tolerance Patient tolerated treatment well   Patient Left with call bell/phone within reach;in chair;with chair alarm set   Nurse Communication          Time: [phone] OT Time Calculation (min): 26 min  Charges: OT General Charges $OT Visit: 1 Visit OT Treatments $Self Care/Home Management : 8-22 mins $Therapeutic Activity: 8-22 mins     Britt Bottom 01/21/2020, 12:38 PM

## 2020-01-21 NOTE — Progress Notes (Signed)
Nutrition Follow-up  DOCUMENTATION CODES:   Severe malnutrition in context of chronic illness  INTERVENTION:   -ContinueEnsure Enlive poTID, each supplement provides 350 kcal and 20 grams of protein -Continue Magic cup TID with meals, each supplement provides 290 kcal and 9 grams of protein -Continue MVI with minerals daily  NUTRITION DIAGNOSIS:   Severe Malnutrition related to chronic illness (CVA) as evidenced by moderate fat depletion,severe fat depletion,moderate muscle depletion,severe muscle depletion.  Ongoing  GOAL:   Patient will meet greater than or equal to 90% of their needs  Progressing   MONITOR:   PO intake,Supplement acceptance,Labs,Weight trends,Skin,I & O's  REASON FOR ASSESSMENT:   Malnutrition Screening Tool    ASSESSMENT:   [name] [name] is a 79 y.o. male with medical history significant for CVA in October 2021 with residual dysarthria, essential hypertension, hyperlipidemia, spinal stenosis who initially presented at [facility] from [facility] SNF due to confusion and right-sided pneumonia.  12/3- s/pPlacement of a rightchest tube. 34F Thal. To pleural evac system. 12/16- chest tube removed  Reviewed I/O's: -685 ml x 24 hours and -9.5 L since 01/07/20  UOP: 925 ml x 24 hours  Pt remains with fair appetite. Noted meal completions 25-75%. Pt continues to consume Ensure Enlive supplements.   Plan to d/c to SNF once medically stable. Per TOC notes, pt awaiting long term Medicaid benefits.   Labs reviewed.   Diet Order:   Diet Order            Diet regular Room service appropriate? Yes; Fluid consistency: Thin  Diet effective now                 EDUCATION NEEDS:   Education needs have been addressed  Skin:  Skin Assessment: Skin Integrity Issues: Skin Integrity Issues:: Other (Comment) Other: s/p chest tube placement  Last BM:  01/20/20  Height:   Ht Readings from Last 1 Encounters:   01/03/20 5\' 10"  (1.778 m)    Weight:   Wt Readings from Last 1 Encounters:  01/03/20 66.8 kg    Ideal Body Weight:  74.5 kg  BMI:  Body mass index is 21.13 kg/m.  Estimated Nutritional Needs:   Kcal:  2150-2350  Protein:  120-135 grams  Fluid:  > 2 L    Loistine Chance, RD, LDN, [place] Registered Dietitian II Certified Diabetes Care and Education Specialist Please refer to [facility] for RD and/or RD on-call/weekend/after hours pager

## 2020-01-22 DIAGNOSIS — J869 Pyothorax without fistula: Secondary | ICD-10-CM | POA: Diagnosis not present

## 2020-01-22 LAB — CBC
HCT: 34.2 % — ABNORMAL LOW (ref 39.0–52.0)
Hemoglobin: 10.3 g/dL — ABNORMAL LOW (ref 13.0–17.0)
MCH: 25 pg — ABNORMAL LOW (ref 26.0–34.0)
MCHC: 30.1 g/dL (ref 30.0–36.0)
MCV: 83 fL (ref 80.0–100.0)
Platelets: 283 10*3/uL (ref 150–400)
RBC: 4.12 MIL/uL — ABNORMAL LOW (ref 4.22–5.81)
RDW: 15.4 % (ref 11.5–15.5)
WBC: 10.6 10*3/uL — ABNORMAL HIGH (ref 4.0–10.5)
nRBC: 0 % (ref 0.0–0.2)

## 2020-01-22 LAB — BASIC METABOLIC PANEL
Anion gap: 9 (ref 5–15)
BUN: 24 mg/dL — ABNORMAL HIGH (ref 8–23)
CO2: 25 mmol/L (ref 22–32)
Calcium: 8.2 mg/dL — ABNORMAL LOW (ref 8.9–10.3)
Chloride: 103 mmol/L (ref 98–111)
Creatinine, Ser: 1.01 mg/dL (ref 0.61–1.24)
GFR, Estimated: >60 mL/min (ref >60)
Glucose, Bld: 98 mg/dL (ref 70–99)
Potassium: 4.3 mmol/L (ref 3.5–5.1)
Sodium: 137 mmol/L (ref 135–145)

## 2020-01-22 NOTE — Progress Notes (Signed)
PROGRESS NOTE  [name] [name] NTZ:[id number] DOB: January 10, 1941 DOA: 01/02/2020 PCP: [name], [name], MD   LOS: 20 days   Brief Narrative / Interim history: 79 year old male with history of recent CVA in 11/2019 with residual dysarthria, hypertension, hyperlipidemia and spinal stenosis, initially presented to [facility] from [facility] SNF with confusion and difficulty breathing.  Work-up revealed right-sided loculated pleural effusion with concern for empyema.  IR at [facility] attempted ultrasound-guided chest tube placement but were unable due to thickness of pleural fluid.  He was evaluated by general surgery at [facility] who recommended transfer to [facility] for thoracic surgery evaluation. Patient was evaluated by thoracic surgery who recommended chest tube placement by IR, that was performed on 01/03/2020.  ID consulted and recommended transitioning from IV Zosyn to IV ceftriaxone and oral Flagyl while in-house and discharge on p.o. Augmentin for a total of 6 weeks with start date of 01/03/2020. Right-sided chest tube removed 12/16. TCTS signed off 12/17 and advised no outpatient follow-up needed.  No acute events or issues reported by patient or RN.  Remains stable for DC pending safe disposition.  TOC team aware and reportedly had difficult to place patient.  Apparently he needs long-term care   Subjective / 24h Interval events: No complaints, watching TV. No chest pain, no abdominal pain, no shortness of breath. Eating well.   Assessment & Plan: Principal Problem Right-sided parapneumonic effusion, empyema - TCV consulted and followed patient while hospitalized.  He underwent a pigtail chest tube on 01/03/2020, output was monitored and eventually it was removed on 12/16.  Follow-up chest x-ray 12/17 without pneumothorax and overall stable.  Thoracic surgery signed off 12/17, no outpatient follow-up as needed.  Currently he is on antibiotics with ceftriaxone and metronidazole. Probably plan  for prolonged course with minimum 4 weeks   Active Problems Acute hypoxic respiratory failure-secondary to right-sided empyema, resolved Acute kidney injury on chronic kidney disease stage II-resolved History of CVA in October 2021 with residual dysarthria-stable, continue aspirin, Plavix, Crestor Essential hypertension-he is normotensive off his blood pressure medications, keep off for now Dementia, no behavioral disturbances-stable, oriented to self.  Per family he is at baseline Debility/physical deconditioning-SNF pending Anemia of chronic disease-hb overall stable  Scheduled Meds: . aspirin EC  81 mg Oral Daily  . clopidogrel  75 mg Oral Daily  . DULoxetine  60 mg Oral Daily  . enoxaparin (LOVENOX) injection  40 mg Subcutaneous Daily  . feeding supplement  237 mL Oral TID BM  . metroNIDAZOLE  500 mg Oral Q8H  . multivitamin with minerals  1 tablet Oral Daily  . rosuvastatin  10 mg Oral Daily   Continuous Infusions: . cefTRIAXone (ROCEPHIN)  IV 2 g (01/21/20 0936)   PRN Meds:.acetaminophen, ondansetron (ZOFRAN) IV  Diet Orders (From admission, onward)    Start     Ordered   01/06/20 1033  Diet regular Room service appropriate? Yes; Fluid consistency: Thin  Diet effective now       Question Answer Comment  Room service appropriate? Yes   Fluid consistency: Thin      01/06/20 1032          DVT prophylaxis: enoxaparin (LOVENOX) injection 40 mg Start: 01/03/20 1000     Code Status: DNR  Family Communication: no family at bedside   Status is: Inpatient  Remains inpatient appropriate because:Inpatient level of care appropriate due to severity of illness   Dispo:  Patient From:    Planned Disposition:    Expected discharge  date: 01/28/2020  Medically stable for discharge:     Consultants:  TCV  Procedures:  Chest tube  Microbiology  Negative cultures  Antimicrobials: Ceftriaxone / Azithromycin     Objective: Vitals:   01/21/20 1336 01/21/20 1348  01/21/20 2111 01/22/20 0513  BP: 128/72  133/79 137/73  Pulse: (!) 39 81 76 69  Resp: 17  16 15   Temp: 97.7 F (36.5 C)  (!) 97.5 F (36.4 C) 98.7 F (37.1 C)  TempSrc: Oral  Oral Oral  SpO2: 99%  96% 97%  Weight:      Height:        Intake/Output Summary (Last 24 hours) at 01/22/2020 [id number] Last data filed at 01/22/2020 0500 Gross per 24 hour  Intake 240 ml  Output 850 ml  Net -610 ml   Filed Weights   01/03/20 1414  Weight: 66.8 kg    Examination:  Constitutional: NAD Respiratory: CTA biL, moves air well, no wheezing  Cardiovascular: RRR, no murmurs, no edema   Data Reviewed: I have independently reviewed following labs and imaging studies   CBC: Recent Labs  Lab 01/17/20 0015 01/19/20 0134 01/21/20 0055 01/22/20 0102  WBC 12.1* 13.2* 12.2* 10.6*  NEUTROABS 8.7* 9.8* 8.8*  --   HGB 10.3* 10.0* 10.7* 10.3*  HCT 32.2* 31.2* 33.6* 34.2*  MCV 81.5 82.3 81.8 83.0  PLT 315 281 292 [id number]   Basic Metabolic Panel: Recent Labs  Lab 01/16/20 0428 01/21/20 0055 01/22/20 0102  NA  --  138 137  K  --  4.6 4.3  CL  --  103 103  CO2  --  25 25  GLUCOSE  --  103* 98  BUN  --  22 24*  CREATININE 1.06 1.09 1.01  CALCIUM  --  8.3* 8.2*   Liver Function Tests: No results for input(s): AST, ALT, ALKPHOS, BILITOT, PROT, ALBUMIN in the last 168 hours. Coagulation Profile: No results for input(s): INR, PROTIME in the last 168 hours. HbA1C: No results for input(s): HGBA1C in the last 72 hours. CBG: No results for input(s): GLUCAP in the last 168 hours.  No results found for this or any previous visit (from the past 240 hour(s)).   Radiology Studies: No results found.   Marzetta Board, MD, PhD [facility]  Between 7 am - 7 pm I am available, please contact me via Amion or Securechat  Between 7 pm - 7 am I am not available, please contact night coverage MD/APP via Amion

## 2020-01-22 NOTE — TOC Progression Note (Addendum)
Transition of Care [facility]) - Progression Note    Patient Details  Name: [name] [name] MRN: [id number] Date of Birth: 1940/08/21  Transition of Care [facility]) CM/SW [place], [place] Phone Number: 01/22/2020, 3:41 PM  Clinical Narrative:     CSW spoke to pts sister [name] [name] about contact information for pts son and stepson. Mary did not have contact numbers but stated his stepson name is [name] [name] and his son [name] [name] who lives in [place].   CSW found charts for both sons but the phone number where disconnected.   CSW called [facility] emergency line to compete a well check for [name] [name] at [place]. And to [employer] at [place] in [place].   4:10pm- [name] [name] called CSW. [name] [name] stated he is pts son, not stepson. [name] [name] stated he didn't know pt as in the hospital and thought he was still at [facility]. [name] [name] stated that he agrees the pt needs long term care and that he is unable to care for pt at home. [name] [name] stated he can give financial counseling the information they need to get him screened for medicaid. CSW updated pts information in the chart.    TOC to follow.     Barriers to Discharge: Continued Medical Work up,SNF Pending bed offer  Expected Discharge Plan and Services         Living arrangements for the past 2 months: [place]                                       Social Determinants of Health (SDOH) Interventions    Readmission Risk Interventions No flowsheet data found.

## 2020-01-23 DIAGNOSIS — R5381 Other malaise: Secondary | ICD-10-CM | POA: Diagnosis not present

## 2020-01-23 DIAGNOSIS — Z8673 Personal history of transient ischemic attack (TIA), and cerebral infarction without residual deficits: Secondary | ICD-10-CM | POA: Diagnosis not present

## 2020-01-23 DIAGNOSIS — J869 Pyothorax without fistula: Secondary | ICD-10-CM | POA: Diagnosis not present

## 2020-01-23 LAB — CBC WITH DIFFERENTIAL/PLATELET
Abs Immature Granulocytes: 0.08 10*3/uL — ABNORMAL HIGH (ref 0.00–0.07)
Basophils Absolute: 0.1 10*3/uL (ref 0.0–0.1)
Basophils Relative: 1 %
Eosinophils Absolute: 0.3 10*3/uL (ref 0.0–0.5)
Eosinophils Relative: 3 %
HCT: 32.9 % — ABNORMAL LOW (ref 39.0–52.0)
Hemoglobin: 10.5 g/dL — ABNORMAL LOW (ref 13.0–17.0)
Immature Granulocytes: 1 %
Lymphocytes Relative: 16 %
Lymphs Abs: 1.7 10*3/uL (ref 0.7–4.0)
MCH: 26.1 pg (ref 26.0–34.0)
MCHC: 31.9 g/dL (ref 30.0–36.0)
MCV: 81.8 fL (ref 80.0–100.0)
Monocytes Absolute: 1.4 10*3/uL — ABNORMAL HIGH (ref 0.1–1.0)
Monocytes Relative: 13 %
Neutro Abs: 7 10*3/uL (ref 1.7–7.7)
Neutrophils Relative %: 66 %
Platelets: 264 10*3/uL (ref 150–400)
RBC: 4.02 MIL/uL — ABNORMAL LOW (ref 4.22–5.81)
RDW: 15.7 % — ABNORMAL HIGH (ref 11.5–15.5)
WBC: 10.5 10*3/uL (ref 4.0–10.5)
nRBC: 0 % (ref 0.0–0.2)

## 2020-01-23 LAB — CREATININE, SERUM
Creatinine, Ser: 1.02 mg/dL (ref 0.61–1.24)
GFR, Estimated: >60 mL/min (ref >60)

## 2020-01-23 NOTE — Progress Notes (Addendum)
[facility] PROGRESS NOTE  [name] [name] MWN:[id number] DOB: [birth year] DOA: 01/02/2020 PCP: [name], [name], MD  Status: Remains inpatient appropriate because:Unsafe d/c plan   Dispo:  Patient From:  Private residence  Planned Disposition:  SNF  Expected discharge date: 01/28/2020  Medically stable for discharge:  Yes; barriers to discharge: Needs SNF placement but Medicaid application in process and is pending.  Patient's son [name] will be assisting with this process   Code Status: DNR Family Communication: Patient only today, updated by [facility] staff on 12/22 regarding patient status and anticipated discharge disposition DVT prophylaxis: Lovenox Vaccination status: Covid vaccination status unknown, has received influenza vaccine as well as pneumococcal vaccine in October 2021  Foley catheter: No  HPI: 79 year old male with history of recent CVA in 11/2019 with residual dysarthria, hypertension, hyperlipidemia and spinal stenosis, initially presented to [facility] from [facility]F with confusion and difficulty breathing. Work-up revealed right-sided loculated pleural effusion with concern for empyema. IR at [facility] attempted ultrasound-guided chest tube placement but were unable due to thickness of pleural fluid. He was evaluated by general surgery at [facility] who recommended transfer to [facility] for thoracic surgery evaluation. Patient was evaluated by thoracic surgery who recommended chest tube placement by IR, that was performed on 01/03/2020. ID consulted and recommended transitioning from IV Zosyn to IV ceftriaxone and oral Flagyl while in-house and discharge on p.o. Augmentin for a total of 6 weeks with start date of 01/03/2020. Right-sided chest tube removed 12/16. TCTS signed off 12/17 and advised no outpatient follow-up needed.  Patient stable for discharge.  PT/OT recommending SNF.  Patient will need long-term Medicaid in place before can  discharge.  Subjective: Patient alert and sitting up in bed.  Has finished pancakes from breakfast tray.  Is requesting bananas to be sliced and placed in his oatmeal along with sugar.  Patient states is able to feed self independently.  Objective: Vitals:   01/22/20 1954 01/23/20 0401  BP: 126/71 (!) 133/99  Pulse: 88 74  Resp: 17 17  Temp: 97.7 F (36.5 C) 98.2 F (36.8 C)  SpO2: 100% 99%    Intake/Output Summary (Last 24 hours) at 01/23/2020 1135 Last data filed at 01/23/2020 0849 Gross per 24 hour  Intake 240 ml  Output 1250 ml  Net -1010 ml   Filed Weights   01/03/20 1414  Weight: 66.8 kg    Exam: Constitutional: NAD, calm, comfortable Respiratory: clear to auscultation bilaterally, no wheezing, no crackles. Normal respiratory effort. No accessory muscle use.  Room air Cardiovascular: Regular rate and rhythm, no murmurs / rubs / gallops. No extremity edema. 2+ pedal pulses. Abdomen: no tenderness, no masses palpated. No hepatosplenomegaly. Bowel sounds positive.  Neurologic: CN 2-12 grossly intact. Sensation intact, DTR normal. Strength 5/5 x on left slightly weaker 4/5 on the right Psychiatric: Normal judgment and insight. Alert and oriented x 3. Normal mood.    Assessment/Plan: Acute problems: Acute hypoxemic respiratory failure  2/2right-sided parapneumonic effusion, empyema  -Hypoxemia has resolved- -TCTS consulted for management. He underwent a pigtail chest tube on 01/03/2020, output was monitored and eventually it was removed on 12/16. -Follow-up chest x-ray 12/17 without pneumothorax.   -Thoracic surgery signed off 12/17, prn OP follow-up recommend  -Continue antibiotics (currently on ceftriaxone and metronidazole initially was treated with IV Zosyn beginning on 12/2) for a total of 4 weeks with expected last dose 02/02/2020  Physical deconditioning -PT and OT recommending SNF. -Continues to require considerable assistance to power up for sit  to stand and to  maintain balance during ambulation.  Movement documented as slow and almost bradykinetic.  Requires rolling walker for mobility  Hypertension -Controlled off antihypertensive medication  Severe protein calorie malnutrition Nutrition Problem: Severe Malnutrition Etiology: chronic illness (CVA) Signs/Symptoms: moderate fat depletion,severe fat depletion,moderate muscle depletion,severe muscle depletion Interventions: Ensure Enlive (each supplement provides 350kcal and 20 grams of protein),Magic cup,MVI -As of 12/23 patient able to feed self and eating meals well (between 50% and 100% of meals over the past 24 hours) Estimated body mass index is 21.13 kg/m as calculated from the following:   Height as of this encounter: 5\' 10"  (1.778 m).   Weight as of this encounter: 66.8 kg.   Wounds: Incision (Closed) 01/03/20 (Active)  Date First Assessed/Time First Assessed: 01/03/20 2000      Assessments 01/03/2020  8:00 PM 01/22/2020 11:00 AM  Dressing Type -- Gauze (Comment)  Dressing Dry;Clean;Intact Clean;Dry;Intact  Dressing Change Frequency -- PRN  Site / Wound Assessment Dressing in place / Unable to assess --  Drainage Description Serosanguineous --     No Linked orders to display     Other problems: Acute kidney injury -Resolved -Current creatinine 1.01 with a GFR greater than 60  History of CVA/dementia without behavioral disturbances -Remains normotensive off of BP medication -Continue Plavix and low-dose aspirin -Continue statin/Crestor -Continue Cymbalta  Anemia of chronic disease -Hemoglobin stable around 10   Data Reviewed: Basic Metabolic Panel: Recent Labs  Lab 01/21/20 0055 01/22/20 0102 01/23/20 0039  NA 138 137  --   K 4.6 4.3  --   CL 103 103  --   CO2 25 25  --   GLUCOSE 103* 98  --   BUN 22 24*  --   CREATININE 1.09 1.01 1.02  CALCIUM 8.3* 8.2*  --    Liver Function Tests: No results for input(s): AST, ALT, ALKPHOS, BILITOT, PROT, ALBUMIN in the  last 168 hours. No results for input(s): LIPASE, AMYLASE in the last 168 hours. No results for input(s): AMMONIA in the last 168 hours. CBC: Recent Labs  Lab 01/17/20 0015 01/19/20 0134 01/21/20 0055 01/22/20 0102 01/23/20 0039  WBC 12.1* 13.2* 12.2* 10.6* 10.5  NEUTROABS 8.7* 9.8* 8.8*  --  7.0  HGB 10.3* 10.0* 10.7* 10.3* 10.5*  HCT 32.2* 31.2* 33.6* 34.2* 32.9*  MCV 81.5 82.3 81.8 83.0 81.8  PLT 315 281 292 283 264   Cardiac Enzymes: No results for input(s): CKTOTAL, CKMB, CKMBINDEX, TROPONINI in the last 168 hours. BNP (last 3 results) Recent Labs    12/26/19 1510 01/01/20 1018  BNP 88.7 177.1*    ProBNP (last 3 results) No results for input(s): PROBNP in the last 8760 hours.  CBG: No results for input(s): GLUCAP in the last 168 hours.  No results found for this or any previous visit (from the past 240 hour(s)).   Studies: No results found.  Scheduled Meds: . aspirin EC  81 mg Oral Daily  . clopidogrel  75 mg Oral Daily  . DULoxetine  60 mg Oral Daily  . enoxaparin (LOVENOX) injection  40 mg Subcutaneous Daily  . feeding supplement  237 mL Oral TID BM  . metroNIDAZOLE  500 mg Oral Q8H  . multivitamin with minerals  1 tablet Oral Daily  . rosuvastatin  10 mg Oral Daily   Continuous Infusions: . cefTRIAXone (ROCEPHIN)  IV 2 g (01/23/20 1016)    Active Problems:   Empyema of lung (HCC)   Severe sepsis without septic  shock [facility])   Consultants:  TCTS  Conventional radiology  Procedures:  Chest tube placement 12/3 by IR  Antibiotics: Anti-infectives (From admission, onward)   Start     Dose/Rate Route Frequency Ordered Stop   01/12/20 1400  metroNIDAZOLE (FLAGYL) tablet 500 mg        500 mg Oral Every 8 hours 01/12/20 1111     01/12/20 1300  cefTRIAXone (ROCEPHIN) 2 g in sodium chloride 0.9 % 100 mL IVPB        2 g 200 mL/hr over 30 Minutes Intravenous Every 24 hours 01/12/20 1111     01/02/20 2330  piperacillin-tazobactam (ZOSYN) IVPB 3.375 g   Status:  Discontinued        3.375 g 12.5 mL/hr over 240 Minutes Intravenous Every 8 hours 01/02/20 2242 01/12/20 1111        Time spent: 30 minutes    Erin Hearing ANP  [facility] 7 am- 330 pm/M-F

## 2020-01-23 NOTE — Consult Note (Signed)
   [facility] CM Inpatient Consult   01/23/2020  Delia Sitar [facility] 1940-10-19 [id number]  Follow up: [facility] [ACO] Patient: [employer]   Patient is inactive status with [facility] due to great than 10 days inpatient stay.  Patient has been engaged by a [facility] Management Coordinator.   Patient will receive a post hospital call and will be evaluated for assessments and disease process education.    Plan: Reviewed inpatient TOC notes for barriers to disposition. Patient is for a skilled nursing facility stay [long term care needs noted due to unsafe]. Continue to follow with inpatient TOC team.   Of note, [facility] Care Management services does not replace or interfere with any services that are needed or arranged by inpatient [facility] care management team.  For additional questions or referrals please contact:  Natividad Brood, RN BSN [facility] Liaison  [phone] business mobile phone Toll free office [phone]  Fax number: [phone] [email or web address]

## 2020-01-23 NOTE — Progress Notes (Signed)
Physical Therapy Treatment Patient Details Name: [name] [name] MRN: [id number] DOB: 12/22/1940 Today's Date: 01/23/2020    History of Present Illness Pt is a 79 y.o. male (from SNF) admitted 01/02/20 as transfer from [facility] (admitted there 12/26/19) for CTS evaluation for possible VATS decortication. Workup revealed R-side loculated pleural efffusion with concern for empyema, possible PNA. PMH includes lumbar stenosis, renal disorder.    PT Comments    Pt supine on arrival, agreeable to therapy session and with good participation and fair tolerance for mobility. Pt continues to require modA for mobility and greatly increased time/multimodal cues to initiate and perform all mobility tasks. Pt needing some increased assist for balance this session, tending to lean toward weaker L side while seated EOB and leaning to stronger R side during standing balance tasks, but able to improve posture with cues. Session focus on seated balance tasks during clean-up, standing for BLE strengthening and short gait task to get to chair. Pt continues to benefit from skilled rehab in a post acute setting to maximize functional gains before returning home.  Follow Up Recommendations  SNF     Equipment Recommendations  Rolling walker with 5" wheels;3in1 (PT)    Recommendations for Other Services       Precautions / Restrictions Precautions Precautions: Fall Precaution Comments: bladder and bowel incontinence Restrictions Weight Bearing Restrictions: No    Mobility  Bed Mobility Overal bed mobility: Needs Assistance Bed Mobility: Supine to Sit     Supine to sit: Mod assist;HOB elevated     General bed mobility comments: mod A to bring LEs off EOB and to elevate trunk, pt able to cross-body reach and pull trunk toward EOB using rail when cued  Transfers Overall transfer level: Needs assistance Equipment used: Rolling walker (2 wheeled) Transfers: Sit to/from Stand Sit to Stand: Mod  assist         General transfer comment: assist to power up and to  steady, cues for hand placement; stood from bed x4 reps  Ambulation/Gait Ambulation/Gait assistance: Mod assist Gait Distance (Feet): 8 Feet Assistive device: Rolling walker (2 wheeled) Gait Pattern/deviations: Step-to pattern;Trunk flexed Gait velocity: Decreased; ~0.1 m/s Gait velocity interpretation: <1.31 ft/sec, indicative of household ambulator General Gait Details: step-by-step cues, occasionally needs LLE manual assist to advance for step, R sided lean, max cues for posture, sequencing, manual assist to manage RW   Stairs             Wheelchair Mobility    Modified Rankin (Stroke Patients Only)       Balance Overall balance assessment: Needs assistance Sitting-balance support: Bilateral upper extremity supported;Feet supported Sitting balance-Leahy Scale: Poor   Postural control: Left lateral lean Standing balance support: Bilateral upper extremity supported Standing balance-Leahy Scale: Poor Standing balance comment: reliant on RW and external support, R lean during mobility and needs max cues for upright posture                            Cognition Arousal/Alertness: Awake/alert Behavior During Therapy: Flat affect Overall Cognitive Status: History of cognitive impairments - at baseline Area of Impairment: Problem solving;Awareness;Safety/judgement;Following commands;Attention                 Orientation Level: Disoriented to;Place;Time;Situation Current Attention Level: Focused Memory: Decreased short-term memory Following Commands: Follows one step commands with increased time Safety/Judgement: Decreased awareness of safety;Decreased awareness of deficits Awareness: Intellectual Problem Solving: Slow processing;Requires verbal cues;Requires tactile cues;Decreased initiation;Difficulty  sequencing General Comments: pt with baseline dementia per family report, needs  increased time to process all cues/answer questions      Exercises General Exercises - Lower Extremity Ankle Circles/Pumps: AROM;Strengthening;Both;10 reps;Supine Long Arc Quad: AAROM;Strengthening;Both;10 reps;Seated Hip Flexion/Marching: AROM;Strengthening;Both;10 reps;Seated Other Exercises Other Exercises: STS x 4 reps for strengthening    General Comments General comments (skin integrity, edema, etc.): pt incontinent of bowels and bladder during session, needed significant cleanup; pt able to lean anterior for assisting with washing legs and lateral lean to reach objects near BOS with min guard; unable to place L or RLE in figure 4 position for donning socks      Pertinent Vitals/Pain Pain Assessment: No/denies pain Faces Pain Scale: Hurts a little bit Pain Location: R rib cage Pain Descriptors / Indicators: Grimacing;Guarding Pain Intervention(s): Monitored during session;Repositioned  Pt denies dizziness during session and not further assessed.  Home Living                      Prior Function            PT Goals (current goals can now be found in the care plan section) Acute Rehab PT Goals Patient Stated Goal: did not specifically state; agreeable to standing/seated transfers and chair transfer and assisting with clean-up/self-care PT Goal Formulation: With patient/family Time For Goal Achievement: 02/03/20 Potential to Achieve Goals: Fair Progress towards PT goals: Progressing toward goals    Frequency    Min 2X/week      PT Plan Current plan remains appropriate    Co-evaluation              AM-PAC PT "6 Clicks" Mobility   Outcome Measure  Help needed turning from your back to your side while in a flat bed without using bedrails?: A Little Help needed moving from lying on your back to sitting on the side of a flat bed without using bedrails?: A Lot Help needed moving to and from a bed to a chair (including a wheelchair)?: A Lot Help needed  standing up from a chair using your arms (e.g., wheelchair or bedside chair)?: A Lot Help needed to walk in hospital room?: A Lot Help needed climbing 3-5 steps with a railing? : Total 6 Click Score: 12    End of Session Equipment Utilized During Treatment: Gait belt Activity Tolerance: Patient tolerated treatment well Patient left: in chair;with call bell/phone within reach;with chair alarm set (RN notified pt will need new bed sheets placed; bleach drying still) Nurse Communication: Mobility status PT Visit Diagnosis: Difficulty in walking, not elsewhere classified (R26.2);Muscle weakness (generalized) (M62.81)     Time: [id number] PT Time Calculation (min) (ACUTE ONLY): 40 min  Charges:  $Therapeutic Exercise: 8-22 mins $Therapeutic Activity: 23-37 mins                     Petina Muraski P., PTA Acute Rehabilitation Services Pager: [phone] Office: [place] 01/23/2020, 12:31 PM

## 2020-01-24 DIAGNOSIS — Z8673 Personal history of transient ischemic attack (TIA), and cerebral infarction without residual deficits: Secondary | ICD-10-CM | POA: Diagnosis not present

## 2020-01-24 DIAGNOSIS — R5381 Other malaise: Secondary | ICD-10-CM | POA: Diagnosis not present

## 2020-01-24 DIAGNOSIS — J869 Pyothorax without fistula: Secondary | ICD-10-CM | POA: Diagnosis not present

## 2020-01-24 NOTE — Progress Notes (Signed)
Occupational Therapy Treatment Patient Details Name: [name] [name] MRN: [id number] DOB: 12-14-1940 Today's Date: 01/24/2020    History of present illness Pt is a 79 y.o. male (from SNF) admitted 01/02/20 as transfer from [facility] (admitted there 12/26/19) for CTS evaluation for possible VATS decortication. Workup revealed R-side loculated pleural efffusion with concern for empyema, possible PNA. PMH includes lumbar stenosis, renal disorder.   OT comments  [name] admitted with above presents to acute OT with following all commands with increased time, able to wash his face and feed himself post setup sitting in recliner. He was able to stand and transfer with min A and needed total A for back peri care post incontinence episode. He will continue to benefit from acute OT with follow up at SNF. Goals updated this session.  Follow Up Recommendations  SNF;Supervision/Assistance - 24 hour    Equipment Recommendations  Other (comment) (TBD at SNF)       Precautions / Restrictions Precautions Precautions: Fall Restrictions Weight Bearing Restrictions: No       Mobility Bed Mobility Overal bed mobility: Needs Assistance Bed Mobility: Supine to Sit   Sidelying to sit: Min assist;HOB elevated (use of rails, able to get his legs off of bed with increased time and come half way up with trunk)          Transfers Overall transfer level: Needs assistance Equipment used: Rolling walker (2 wheeled) Transfers: Sit to/from [name] Sit to Stand: Min assist Stand pivot transfers: Min assist            Balance Overall balance assessment: Needs assistance Sitting-balance support: No upper extremity supported;Feet supported Sitting balance-Leahy Scale: Fair     Standing balance support: Bilateral upper extremity supported Standing balance-Leahy Scale: Poor Standing balance comment: reliant on RW                           ADL either  performed or assessed with clinical judgement   ADL Overall ADL's : Needs assistance/impaired Eating/Feeding: Set up;Sitting Eating/Feeding Details (indicate cue type and reason): in recliner (chair alarm on) Grooming: Wash/dry face;Set up;Supervision/safety;Sitting Grooming Details (indicate cue type and reason): in recliner (chair alarm on)                 Toilet Transfer: Minimal assistance;Stand-pivot;RW Toilet Transfer Details (indicate cue type and reason): Bed>recliner on his right Toileting- Clothing Manipulation and Hygiene: Total assistance Toileting - Clothing Manipulation Details (indicate cue type and reason): pt able to stand with min gaurd A while I cleaned him up from a bowel incontience episode             Vision Patient Visual Report: No change from baseline            Cognition Arousal/Alertness: Awake/alert Behavior During Therapy: Flat affect Overall Cognitive Status: History of cognitive impairments - at baseline Area of Impairment: Problem solving;Awareness;Safety/judgement;Following commands;Attention                 Orientation Level: Disoriented to;Place;Time;Situation Current Attention Level: Sustained Memory: Decreased short-term memory Following Commands: Follows one step commands with increased time Safety/Judgement: Decreased awareness of safety;Decreased awareness of deficits   Problem Solving: Slow processing;Requires verbal cues;Requires tactile cues;Decreased initiation;Difficulty sequencing General Comments: pt with baseline dementia per family report, needs increased time to process all cues/answer questions                   Pertinent Vitals/  Pain       Pain Assessment: No/denies pain         Frequency  Min 2X/week        Progress Toward Goals  OT Goals(current goals can now be found in the care plan section)  Progress towards OT goals: Progressing toward goals  Acute Rehab OT Goals Patient Stated  Goal: to get up to recliner to eat lunch OT Goal Formulation: With patient Time For Goal Achievement: 02/07/20 Potential to Achieve Goals: [place] Discharge plan remains appropriate       AM-PAC OT "6 Clicks" Daily Activity     Outcome Measure   Help from another person eating meals?: A Little (setup) Help from another person taking care of personal grooming?: A Little Help from another person toileting, which includes using toliet, bedpan, or urinal?: Total Help from another person bathing (including washing, rinsing, drying)?: A Lot Help from another person to put on and taking off regular upper body clothing?: A Lot Help from another person to put on and taking off regular lower body clothing?: A Lot 6 Click Score: 13    End of Session Equipment Utilized During Treatment: Gait belt;Rolling walker  OT Visit Diagnosis: Unsteadiness on feet (R26.81);Other abnormalities of gait and mobility (R26.89);Muscle weakness (generalized) (M62.81);Other symptoms and signs involving cognitive function   Activity Tolerance Patient tolerated treatment well   Patient Left in chair;with call bell/phone within reach;with chair alarm set   Nurse Communication          Time: [phone] OT Time Calculation (min): 24 min  Charges: OT General Charges $OT Visit: 1 Visit OT Treatments $Self Care/Home Management : 23-37 mins  [address], OTR/L Acute [employer] Pager [phone] Office [phone]      Almon Register 01/24/2020, 12:55 PM

## 2020-01-24 NOTE — Plan of Care (Signed)
°  Problem: Education: Goal: Knowledge of General Education information will improve Description: Including pain rating scale, medication(s)/side effects and non-pharmacologic comfort measures Outcome: Progressing   Problem: Clinical Measurements: Goal: Ability to maintain clinical measurements within normal limits will improve Outcome: Progressing Goal: Will remain free from infection Outcome: Progressing Goal: Diagnostic test results will improve Outcome: Progressing Goal: Respiratory complications will improve Outcome: Progressing Goal: Cardiovascular complication will be avoided Outcome: Progressing   Problem: Pain Managment: Goal: General experience of comfort will improve Outcome: Progressing   Problem: Skin Integrity: Goal: Risk for impaired skin integrity will decrease Outcome: Progressing   

## 2020-01-24 NOTE — Progress Notes (Signed)
[facility] PROGRESS NOTE  [name] [name] HEN:[id number] DOB: 12/15/1940 DOA: 01/02/2020 PCP: [name], [name], MD  Status: Remains inpatient appropriate because:Unsafe d/c plan   Dispo:  Patient From:  Private residence  Planned Disposition:  SNF  Expected discharge date: 01/28/2020  Medically stable for discharge:  Yes; barriers to discharge: Needs SNF placement but Medicaid application in process and is pending.  Patient's son [name] [name] will be assisting with this process   Code Status: DNR Family Communication: Patient only today, updated by [facility] staff on 12/22 regarding patient status and anticipated discharge disposition DVT prophylaxis: Lovenox Vaccination status: Covid vaccination status unknown, has received influenza vaccine as well as pneumococcal vaccine in October 2021  Foley catheter: No   HPI: 79 year old male with history of recent CVA in 11/2019 with residual dysarthria, hypertension, hyperlipidemia and spinal stenosis, initially presented to [facility] from [facility] SNF with confusion and difficulty breathing. Work-up revealed right-sided loculated pleural effusion with concern for empyema. IR at [facility] attempted ultrasound-guided chest tube placement but were unable due to thickness of pleural fluid. He was evaluated by general surgery at [facility] who recommended transfer to [facility] for thoracic surgery evaluation. Patient was evaluated by thoracic surgery who recommended chest tube placement by IR, that was performed on 01/03/2020. ID consulted and recommended transitioning from IV Zosyn to IV ceftriaxone and oral Flagyl while in-house and discharge on p.o. Augmentin for a total of 6 weeks with start date of 01/03/2020. Right-sided chest tube removed 12/16. TCTS signed off 12/17 and advised no outpatient follow-up needed.  Patient stable for discharge.  PT/OT recommending SNF.  Patient will need long-term Medicaid in place before can  discharge.  Subjective: Awakened from sleep.  Confused.  Insisting today is the 18th and when pressed on the stated it is my birthday.  Patient's birthday is on [birth year].  To to reorient patient to today's current date and upcoming holiday that he continues to insist that today is the 18th and it is his birthday.  Objective: Vitals:   01/23/20 2008 01/24/20 0407  BP: 117/72 123/71  Pulse: 75 78  Resp: 18 16  Temp: 98 F (36.7 C) 98.4 F (36.9 C)  SpO2: 99% 98%    Intake/Output Summary (Last 24 hours) at 01/24/2020 1134 Last data filed at 01/24/2020 3614 Gross per 24 hour  Intake 120 ml  Output 1650 ml  Net -1530 ml   Filed Weights   01/03/20 1414  Weight: 66.8 kg    Exam: Constitutional: Awakened, calm and in no acute distress Respiratory: Room air, bilateral lung sounds clear to auscultation and no increased work of breathing Cardiovascular: Heart sounds S1-S2 with no murmurs, pulses regular nontachycardic, no peripheral edema Abdomen: Soft, nontender nondistended.  Tolerating diet. LBM 12/23 Neurologic: CN 2-12 grossly intact. Sensation intact, DTR normal. Strength 5/5 x on left slightly weaker 4/5 on the right Psychiatric: Alert and oriented x name only. Normal mood.    Assessment/Plan: Acute problems: Acute hypoxemic respiratory failure  2/2right-sided parapneumonic effusion, empyema  -Hypoxemia (resolved) -TCTS consulted -s/p pigtail chest tube on 01/03/2020 and was removed on 12/16. -Follow-up chest x-ray 12/17 without pneumothorax.   -Thoracic surgery signed off 12/17, prn OP follow-up recommend  -Continue antibiotics (currently on ceftriaxone and metronidazole initially was treated with IV Zosyn beginning on 12/2) for a total of 4 weeks with expected last dose 02/02/2020  Physical deconditioning -PT and OT recommending SNF. -Continues to require considerable assistance to power up for sit to stand and  to maintain balance during ambulation.  Movement documented as  slow and almost bradykinetic.  Requires rolling walker for mobility  Hypertension -Controlled off antihypertensive medication  Severe protein calorie malnutrition Nutrition Problem: Severe Malnutrition Etiology: chronic illness (CVA) Signs/Symptoms: moderate fat depletion,severe fat depletion,moderate muscle depletion,severe muscle depletion Interventions: Ensure Enlive (each supplement provides 350kcal and 20 grams of protein),Magic cup,MVI -As of 12/23 patient able to feed self and eating meals well (between 50% and 100% of meals over the past 24 hours) Estimated body mass index is 21.13 kg/m as calculated from the following:   Height as of this encounter: 5\' 10"  (1.778 m).   Weight as of this encounter: 66.8 kg.   Wounds: Incision (Closed) 01/03/20 (Active)  Date First Assessed/Time First Assessed: 01/03/20 2000      Assessments 01/03/2020  8:00 PM 01/24/2020  8:49 AM  Dressing Dry;Clean;Intact --  Site / Wound Assessment Dressing in place / Unable to assess --  Drainage Amount -- None  Drainage Description Serosanguineous --     No Linked orders to display     Other problems: Acute kidney injury -Resolved -Current creatinine 1.01 with a GFR greater than 60  History of CVA/dementia without behavioral disturbances -Remains normotensive off of BP medication -Continue Plavix and low-dose aspirin -Continue statin/Crestor -Continue Cymbalta  Anemia of chronic disease -Hemoglobin stable around 10   Data Reviewed: Basic Metabolic Panel: Recent Labs  Lab 01/21/20 0055 01/22/20 0102 01/23/20 0039  NA 138 137  --   K 4.6 4.3  --   CL 103 103  --   CO2 25 25  --   GLUCOSE 103* 98  --   BUN 22 24*  --   CREATININE 1.09 1.01 1.02  CALCIUM 8.3* 8.2*  --    Liver Function Tests: No results for input(s): AST, ALT, ALKPHOS, BILITOT, PROT, ALBUMIN in the last 168 hours. No results for input(s): LIPASE, AMYLASE in the last 168 hours. No results for input(s): AMMONIA in  the last 168 hours. CBC: Recent Labs  Lab 01/19/20 0134 01/21/20 0055 01/22/20 0102 01/23/20 0039  WBC 13.2* 12.2* 10.6* 10.5  NEUTROABS 9.8* 8.8*  --  7.0  HGB 10.0* 10.7* 10.3* 10.5*  HCT 31.2* 33.6* 34.2* 32.9*  MCV 82.3 81.8 83.0 81.8  PLT 281 292 283 264   Cardiac Enzymes: No results for input(s): CKTOTAL, CKMB, CKMBINDEX, TROPONINI in the last 168 hours. BNP (last 3 results) Recent Labs    12/26/19 1510 01/01/20 1018  BNP 88.7 177.1*    ProBNP (last 3 results) No results for input(s): PROBNP in the last 8760 hours.  CBG: No results for input(s): GLUCAP in the last 168 hours.  No results found for this or any previous visit (from the past 240 hour(s)).   Studies: No results found.  Scheduled Meds: . aspirin EC  81 mg Oral Daily  . clopidogrel  75 mg Oral Daily  . DULoxetine  60 mg Oral Daily  . enoxaparin (LOVENOX) injection  40 mg Subcutaneous Daily  . feeding supplement  237 mL Oral TID BM  . metroNIDAZOLE  500 mg Oral Q8H  . multivitamin with minerals  1 tablet Oral Daily  . rosuvastatin  10 mg Oral Daily   Continuous Infusions: . cefTRIAXone (ROCEPHIN)  IV 2 g (01/24/20 [id number])    Active Problems:   Empyema of lung (HCC)   Severe sepsis without septic shock ([place])   History of CVA in adulthood   Physical deconditioning   Consultants:  TCTS  Conventional radiology  Procedures:  Chest tube placement 12/3 by IR  Antibiotics: Anti-infectives (From admission, onward)   Start     Dose/Rate Route Frequency Ordered Stop   01/12/20 1400  metroNIDAZOLE (FLAGYL) tablet 500 mg        500 mg Oral Every 8 hours 01/12/20 1111     01/12/20 1300  cefTRIAXone (ROCEPHIN) 2 g in sodium chloride 0.9 % 100 mL IVPB        2 g 200 mL/hr over 30 Minutes Intravenous Every 24 hours 01/12/20 1111     01/02/20 2330  piperacillin-tazobactam (ZOSYN) IVPB 3.375 g  Status:  Discontinued        3.375 g 12.5 mL/hr over 240 Minutes Intravenous Every 8 hours 01/02/20  2242 01/12/20 1111       Time spent: 30 minutes    Erin Hearing ANP  [facility] 7 am- 330 pm/M-F 22 days

## 2020-01-25 DIAGNOSIS — J869 Pyothorax without fistula: Secondary | ICD-10-CM | POA: Diagnosis not present

## 2020-01-25 NOTE — Progress Notes (Signed)
PROGRESS NOTE  [name] [name] [id number] DOB: 01/27/1941 DOA: 01/02/2020 PCP: [name], [name], MD   LOS: 23 days   Brief Narrative / Interim history: 79 year old male with history of recent CVA in 11/2019 with residual dysarthria, hypertension, hyperlipidemia and spinal stenosis, initially presented to [facility] from [facility] with confusion and difficulty breathing.  Work-up revealed right-sided loculated pleural effusion with concern for empyema.  IR at [facility] attempted ultrasound-guided chest tube placement but were unable due to thickness of pleural fluid.  He was evaluated by general surgery at [facility] who recommended transfer to [facility] for thoracic surgery evaluation. Patient was evaluated by thoracic surgery who recommended chest tube placement by IR, that was performed on 01/03/2020.  ID consulted and recommended transitioning from IV Zosyn to IV ceftriaxone and oral Flagyl while in-house and discharge on p.o. Augmentin for a total of 6 weeks with start date of 01/03/2020. Right-sided chest tube removed 12/16. TCTS signed off 12/17 and advised no outpatient follow-up needed.  No acute events or issues reported by patient or RN.  Remains stable for DC pending safe disposition.  TOC team aware and reportedly had difficult to place patient.  Apparently he needs long-term care   Subjective / 24h Interval events: No complaints.  Assessment & Plan: Principal Problem Right-sided parapneumonic effusion, empyema - TCV consulted and followed patient while hospitalized.  He underwent a pigtail chest tube on 01/03/2020, output was monitored and eventually it was removed on 12/16.  Follow-up chest x-ray 12/17 without pneumothorax and overall stable.  Thoracic surgery signed off 12/17, no outpatient follow-up as needed.  Currently he is on antibiotics with ceftriaxone and metronidazole. Probably plan for prolonged course with minimum 4 weeks   Active Problems Acute hypoxic  respiratory failure-secondary to right-sided empyema, resolved Acute kidney injury on chronic kidney disease stage II-resolved History of CVA in October 2021 with residual dysarthria-stable, continue aspirin, Plavix, Crestor Essential hypertension-he is normotensive off his blood pressure medications, keep off for now Dementia, no behavioral disturbances-stable, oriented to self.  Per family he is at baseline Debility/physical deconditioning-SNF pending Anemia of chronic disease-hb overall stable  Scheduled Meds: . aspirin EC  81 mg Oral Daily  . clopidogrel  75 mg Oral Daily  . DULoxetine  60 mg Oral Daily  . enoxaparin (LOVENOX) injection  40 mg Subcutaneous Daily  . feeding supplement  237 mL Oral TID BM  . metroNIDAZOLE  500 mg Oral Q8H  . multivitamin with minerals  1 tablet Oral Daily  . rosuvastatin  10 mg Oral Daily   Continuous Infusions: . cefTRIAXone (ROCEPHIN)  IV 2 g (01/24/20 0838)   PRN Meds:.acetaminophen, ondansetron (ZOFRAN) IV  Diet Orders (From admission, onward)    Start     Ordered   01/23/20 0819  Diet regular Room service appropriate? Yes; Fluid consistency: Thin  Diet effective now       Comments: SEND APPLESAUCE WITH EACH TRAY  Question Answer Comment  Room service appropriate? Yes   Fluid consistency: Thin      01/23/20 0818          DVT prophylaxis: enoxaparin (LOVENOX) injection 40 mg Start: 01/03/20 1000     Code Status: DNR  Family Communication: no family at bedside   Status is: Inpatient  Remains inpatient appropriate because:Inpatient level of care appropriate due to severity of illness   Dispo:  Patient From:    Planned Disposition:    Expected discharge date: 01/28/2020  Medically stable for discharge:  Consultants:  TCV  Procedures:  Chest tube  Microbiology  Negative cultures  Antimicrobials: Ceftriaxone / Azithromycin     Objective: Vitals:   01/24/20 0407 01/24/20 1353 01/24/20 2013 01/25/20 0526  BP:  123/71 139/73 (!) 134/93 140/85  Pulse: 78 90 80 72  Resp: 16 17 16 18   Temp: 98.4 F (36.9 C) 97.6 F (36.4 C) 98.3 F (36.8 C) 98.4 F (36.9 C)  TempSrc: Oral Oral Oral Oral  SpO2: 98% 100% 98% 99%  Weight:      Height:        Intake/Output Summary (Last 24 hours) at 01/25/2020 0817 Last data filed at 01/24/2020 0849 Gross per 24 hour  Intake --  Output 800 ml  Net -800 ml   Filed Weights   01/03/20 1414  Weight: 66.8 kg    Examination:  Constitutional: No distress Respiratory: Clear, no wheezing Cardiovascular: Regular rate and rhythm, no murmurs  Data Reviewed: I have independently reviewed following labs and imaging studies   CBC: Recent Labs  Lab 01/19/20 0134 01/21/20 0055 01/22/20 0102 01/23/20 0039  WBC 13.2* 12.2* 10.6* 10.5  NEUTROABS 9.8* 8.8*  --  7.0  HGB 10.0* 10.7* 10.3* 10.5*  HCT 31.2* 33.6* 34.2* 32.9*  MCV 82.3 81.8 83.0 81.8  PLT 281 292 283 756   Basic Metabolic Panel: Recent Labs  Lab 01/21/20 0055 01/22/20 0102 01/23/20 0039  NA 138 137  --   K 4.6 4.3  --   CL 103 103  --   CO2 25 25  --   GLUCOSE 103* 98  --   BUN 22 24*  --   CREATININE 1.09 1.01 1.02  CALCIUM 8.3* 8.2*  --    Liver Function Tests: No results for input(s): AST, ALT, ALKPHOS, BILITOT, PROT, ALBUMIN in the last 168 hours. Coagulation Profile: No results for input(s): INR, PROTIME in the last 168 hours. HbA1C: No results for input(s): HGBA1C in the last 72 hours. CBG: No results for input(s): GLUCAP in the last 168 hours.  No results found for this or any previous visit (from the past 240 hour(s)).   Radiology Studies: No results found.   Marzetta Board, MD, PhD [facility]  Between 7 am - 7 pm I am available, please contact me via Amion or Securechat  Between 7 pm - 7 am I am not available, please contact night coverage MD/APP via Amion

## 2020-01-26 DIAGNOSIS — J869 Pyothorax without fistula: Secondary | ICD-10-CM | POA: Diagnosis not present

## 2020-01-26 LAB — CBC
HCT: 33.6 % — ABNORMAL LOW (ref 39.0–52.0)
Hemoglobin: 10.9 g/dL — ABNORMAL LOW (ref 13.0–17.0)
MCH: 26.4 pg (ref 26.0–34.0)
MCHC: 32.4 g/dL (ref 30.0–36.0)
MCV: 81.4 fL (ref 80.0–100.0)
Platelets: 273 10*3/uL (ref 150–400)
RBC: 4.13 MIL/uL — ABNORMAL LOW (ref 4.22–5.81)
RDW: 16.4 % — ABNORMAL HIGH (ref 11.5–15.5)
WBC: 10.4 10*3/uL (ref 4.0–10.5)
nRBC: 0 % (ref 0.0–0.2)

## 2020-01-26 LAB — COMPREHENSIVE METABOLIC PANEL
ALT: 12 U/L (ref 0–44)
AST: 23 U/L (ref 15–41)
Albumin: 2.1 g/dL — ABNORMAL LOW (ref 3.5–5.0)
Alkaline Phosphatase: 52 U/L (ref 38–126)
Anion gap: 9 (ref 5–15)
BUN: 18 mg/dL (ref 8–23)
CO2: 23 mmol/L (ref 22–32)
Calcium: 8.4 mg/dL — ABNORMAL LOW (ref 8.9–10.3)
Chloride: 104 mmol/L (ref 98–111)
Creatinine, Ser: 1 mg/dL (ref 0.61–1.24)
GFR, Estimated: >60 mL/min (ref >60)
Glucose, Bld: 83 mg/dL (ref 70–99)
Potassium: 4.6 mmol/L (ref 3.5–5.1)
Sodium: 136 mmol/L (ref 135–145)
Total Bilirubin: 0.6 mg/dL (ref 0.3–1.2)
Total Protein: 6.5 g/dL (ref 6.5–8.1)

## 2020-01-26 NOTE — Progress Notes (Signed)
PROGRESS NOTE  [name] [name] ZSW:[id number] DOB: 06/04/1940 DOA: 01/02/2020 PCP: [name], [name], MD   LOS: 24 days   Brief Narrative / Interim history: 79 year old male with history of recent CVA in 11/2019 with residual dysarthria, hypertension, hyperlipidemia and spinal stenosis, initially presented to [facility] from [facility] SNF with confusion and difficulty breathing.  Work-up revealed right-sided loculated pleural effusion with concern for empyema.  IR at [facility] attempted ultrasound-guided chest tube placement but were unable due to thickness of pleural fluid.  He was evaluated by general surgery at [facility] who recommended transfer to [facility] for thoracic surgery evaluation. Patient was evaluated by thoracic surgery who recommended chest tube placement by IR, that was performed on 01/03/2020.  ID consulted and recommended transitioning from IV Zosyn to IV ceftriaxone and oral Flagyl while in-house and discharge on p.o. Augmentin for a total of 6 weeks with start date of 01/03/2020. Right-sided chest tube removed 12/16. TCTS signed off 12/17 and advised no outpatient follow-up needed.  No acute events or issues reported by patient or RN.  Remains stable for DC pending safe disposition.  TOC team aware and reportedly had difficult to place patient.  Apparently he needs long-term care  Subjective / 24h Interval events: No complaints this morning, just woke up, awaiting breakfast  Assessment & Plan: Principal Problem Right-sided parapneumonic effusion, empyema - TCV consulted and followed patient while hospitalized.  He underwent a pigtail chest tube on 01/03/2020, output was monitored and eventually it was removed on 12/16.  Follow-up chest x-ray 12/17 without pneumothorax and overall stable.  Thoracic surgery signed off 12/17, no outpatient follow-up as needed.  Currently he is on antibiotics with ceftriaxone and metronidazole. Probably plan for prolonged course with minimum 4 weeks    Active Problems Acute hypoxic respiratory failure-secondary to right-sided empyema, resolved Acute kidney injury on chronic kidney disease stage II-resolved History of CVA in October 2021 with residual dysarthria-stable, continue aspirin, Plavix, Crestor Essential hypertension-he is normotensive off his blood pressure medications, keep off for now Dementia, no behavioral disturbances-stable, oriented to self.  Per family he is at baseline Debility/physical deconditioning-SNF pending Anemia of chronic disease-hb overall stable  Scheduled Meds:  aspirin EC  81 mg Oral Daily   clopidogrel  75 mg Oral Daily   DULoxetine  60 mg Oral Daily   enoxaparin (LOVENOX) injection  40 mg Subcutaneous Daily   feeding supplement  237 mL Oral TID BM   metroNIDAZOLE  500 mg Oral Q8H   multivitamin with minerals  1 tablet Oral Daily   rosuvastatin  10 mg Oral Daily   Continuous Infusions:  cefTRIAXone (ROCEPHIN)  IV 2 g (01/25/20 0948)   PRN Meds:.acetaminophen, ondansetron (ZOFRAN) IV  Diet Orders (From admission, onward)    Start     Ordered   01/23/20 0819  Diet regular Room service appropriate? Yes; Fluid consistency: Thin  Diet effective now       Comments: SEND APPLESAUCE WITH EACH TRAY  Question Answer Comment  Room service appropriate? Yes   Fluid consistency: Thin      01/23/20 0818          DVT prophylaxis: enoxaparin (LOVENOX) injection 40 mg Start: 01/03/20 1000     Code Status: DNR  Family Communication: no family at bedside   Status is: Inpatient  Remains inpatient appropriate because:Inpatient level of care appropriate due to severity of illness   Dispo:  Patient From:    Planned Disposition:    Expected discharge date: 01/28/2020  Medically stable for discharge:     Consultants:  TCV  Procedures:  Chest tube  Microbiology  Negative cultures  Antimicrobials: Ceftriaxone / Azithromycin     Objective: Vitals:   01/25/20 0940 01/25/20 1500  01/25/20 2041 01/26/20 0521  BP: 128/68 (!) 143/84 138/68 (!) 141/75  Pulse: 75 78 78 69  Resp:  18 18 16   Temp: (!) 97.4 F (36.3 C) 98.4 F (36.9 C) 98.2 F (36.8 C) 97.9 F (36.6 C)  TempSrc: Oral Oral Oral   SpO2: 98% 98% 98% 100%  Weight:      Height:        Intake/Output Summary (Last 24 hours) at 01/26/2020 0736 Last data filed at 01/26/2020 [id number] Gross per 24 hour  Intake 550 ml  Output 1350 ml  Net -800 ml   Filed Weights   01/03/20 1414  Weight: 66.8 kg    Examination:  Constitutional: NAD Respiratory: CTA Cardiovascular: Regular rate and rhythm, no murmurs, no edema  Data Reviewed: I have independently reviewed following labs and imaging studies   CBC: Recent Labs  Lab 01/21/20 0055 01/22/20 0102 01/23/20 0039 01/26/20 0403  WBC 12.2* 10.6* 10.5 10.4  NEUTROABS 8.8*  --  7.0  --   HGB 10.7* 10.3* 10.5* 10.9*  HCT 33.6* 34.2* 32.9* 33.6*  MCV 81.8 83.0 81.8 81.4  PLT 292 283 264 [id number]   Basic Metabolic Panel: Recent Labs  Lab 01/21/20 0055 01/22/20 0102 01/23/20 0039 01/26/20 0403  NA 138 137  --  136  K 4.6 4.3  --  4.6  CL 103 103  --  104  CO2 25 25  --  23  GLUCOSE 103* 98  --  83  BUN 22 24*  --  18  CREATININE 1.09 1.01 1.02 1.00  CALCIUM 8.3* 8.2*  --  8.4*   Liver Function Tests: Recent Labs  Lab 01/26/20 0403  AST 23  ALT 12  ALKPHOS 52  BILITOT 0.6  PROT 6.5  ALBUMIN 2.1*   Coagulation Profile: No results for input(s): INR, PROTIME in the last 168 hours. HbA1C: No results for input(s): HGBA1C in the last 72 hours. CBG: No results for input(s): GLUCAP in the last 168 hours.  No results found for this or any previous visit (from the past 240 hour(s)).   Radiology Studies: No results found.   Marzetta Board, MD, PhD [facility]  Between 7 am - 7 pm I am available, please contact me via Amion or Securechat  Between 7 pm - 7 am I am not available, please contact night coverage MD/APP via Amion

## 2020-01-26 NOTE — Plan of Care (Signed)
  Problem: Education: Goal: Knowledge of General Education information will improve Description Including pain rating scale, medication(s)/side effects and non-pharmacologic comfort measures Outcome: Progressing   

## 2020-01-27 DIAGNOSIS — Z8673 Personal history of transient ischemic attack (TIA), and cerebral infarction without residual deficits: Secondary | ICD-10-CM | POA: Diagnosis not present

## 2020-01-27 DIAGNOSIS — J869 Pyothorax without fistula: Secondary | ICD-10-CM | POA: Diagnosis not present

## 2020-01-27 DIAGNOSIS — R5381 Other malaise: Secondary | ICD-10-CM | POA: Diagnosis not present

## 2020-01-27 NOTE — Progress Notes (Signed)
Physical Therapy Treatment Patient Details Name: [name] [name] MRN: [id number] DOB: December 26, 1940 Today's Date: 01/27/2020    History of Present Illness Pt is a 79 y.o. male (from SNF) admitted 01/02/20 as transfer from [facility] (admitted there 12/26/19) for CTS evaluation for possible VATS decortication. Workup revealed R-side loculated pleural efffusion with concern for empyema, possible PNA. PMH includes lumbar stenosis, renal disorder.    PT Comments    Patient making slow progress with acute therapy. He was limited this session by bowel incontinence and min-mod assist required to transfer bed>BSC. Pt mildly confused throughout but followed commands with extra time. He was able to take several small steps to ambulate SBC to recliner with RW, mod assist to steady and assist at [place] ~25% of the time to advance due to weakness. He will continue to benefit from skilled PT interventions to progress mobility as able. Continue to recommend SNF for therapy follow up.    Follow Up Recommendations  SNF     Equipment Recommendations  Rolling walker with 5" wheels;3in1 (PT)    Recommendations for Other Services       Precautions / Restrictions Precautions Precautions: Fall Precaution Comments: bladder and bowel incontinence Restrictions Weight Bearing Restrictions: No    Mobility  Bed Mobility Overal bed mobility: Needs Assistance Bed Mobility: Supine to Sit     Supine to sit: Mod assist;HOB elevated     General bed mobility comments: Verbal/tactile cues to reach for bed rail and assist for LE's off EOB and to rasie trunk upright. pt attempted anterio scooting at EOB, min assist needed to complete.  Transfers Overall transfer level: Needs assistance Equipment used: Rolling walker (2 wheeled) Transfers: Sit to/from [facility] Sit to Stand: Min assist;Mod assist;From elevated surface Stand pivot transfers: Min assist       General transfer comment: Mod  assist for power up from low surface; min assist from elevated EOB and BSC. Pt with bowel incontinence in standing and stand step transfer to [facility] required.  Ambulation/Gait Ambulation/Gait assistance: Mod assist Gait Distance (Feet): 6 Feet Assistive device: Rolling walker (2 wheeled) Gait Pattern/deviations: Step-to pattern;Trunk flexed Gait velocity: decr   General Gait Details: verbal cues for step sequencing and assist required to manage RW and steady balance. Pt required assist at [place] ~25% of the time to advance forward. pt moves slowly.   Stairs             Wheelchair Mobility    Modified Rankin (Stroke Patients Only)       Balance Overall balance assessment: Needs assistance Sitting-balance support: No upper extremity supported;Feet supported Sitting balance-Leahy Scale: Fair Sitting balance - Comments: posterior lean especially without UE support Postural control: Posterior lean Standing balance support: Bilateral upper extremity supported;During functional activity Standing balance-Leahy Scale: Poor Standing balance comment: heavy reliance on RW and min-mod assist to maintain balance. Total assist required for perianal care follow bowel incontinence.                            Cognition Arousal/Alertness: Awake/alert Behavior During Therapy: Flat affect Overall Cognitive Status: History of cognitive impairments - at baseline                   Orientation Level: Disoriented to;Place;Time;Situation   Memory: Decreased short-term memory Following Commands: Follows one step commands with increased time Safety/Judgement: Decreased awareness of safety;Decreased awareness of deficits   Problem Solving: Slow processing;Decreased initiation;Difficulty sequencing;Requires  verbal cues;Requires tactile cues General Comments: pt mildly confused wtih hx of dementia, pt becomes fatigued easily and less interactive as he fatigues.      Exercises       General Comments        Pertinent Vitals/Pain Pain Assessment: Faces Faces Pain Scale: No hurt Pain Intervention(s): Monitored during session    Home Living                      Prior Function            PT Goals (current goals can now be found in the care plan section) Acute Rehab PT Goals PT Goal Formulation: With patient/family Time For Goal Achievement: 02/03/20 Potential to Achieve Goals: Fair Progress towards PT goals: Progressing toward goals    Frequency    Min 2X/week      PT Plan Current plan remains appropriate    Co-evaluation              AM-PAC PT "6 Clicks" Mobility   Outcome Measure  Help needed turning from your back to your side while in a flat bed without using bedrails?: A Little Help needed moving from lying on your back to sitting on the side of a flat bed without using bedrails?: A Lot Help needed moving to and from a bed to a chair (including a wheelchair)?: A Lot Help needed standing up from a chair using your arms (e.g., wheelchair or bedside chair)?: A Lot Help needed to walk in hospital room?: A Lot Help needed climbing 3-5 steps with a railing? : Total 6 Click Score: 12    End of Session Equipment Utilized During Treatment: Gait belt Activity Tolerance: Patient tolerated treatment well Patient left: in chair;with call bell/phone within reach;with chair alarm set Nurse Communication: Mobility status PT Visit Diagnosis: Difficulty in walking, not elsewhere classified (R26.2);Muscle weakness (generalized) (M62.81)     Time: 0240-9735 PT Time Calculation (min) (ACUTE ONLY): 32 min  Charges:  $Gait Training: 8-22 mins $Therapeutic Activity: 8-22 mins                     Wynn Maudlin, DPT Acute Rehabilitation Services Office [phone] Pager [phone]     [name] 01/27/2020, 4:40 PM

## 2020-01-27 NOTE — Progress Notes (Addendum)
[facility] PROGRESS NOTE  [name] [name] [id number]:[id number] DOB: 11-12-1940 DOA: 01/02/2020 PCP: [place], [name], MD  Status: Remains inpatient appropriate because:Unsafe d/c plan   Dispo:  Patient From:  Private residence  Planned Disposition:  SNF  Expected discharge date: 01/29/2020  Medically stable for discharge:  Yes; barriers to discharge: Needs SNF placement but Medicaid application in process and is pending.  Patient's son Janyth Pupa will be assisting with this process; patient sister has also been assisting    Code Status: DNR Family Communication: Patient only today, updated by [facility] staff on 12/22 regarding patient status and anticipated discharge disposition DVT prophylaxis: Lovenox Vaccination status: Covid vaccination status unknown, has received influenza vaccine as well as pneumococcal vaccine in October 2021  Foley catheter: No   HPI: 79 year old male with history of recent CVA in 11/2019 with residual dysarthria, hypertension, hyperlipidemia and spinal stenosis, initially presented to [facility] from [facility] SNF with confusion and difficulty breathing. Work-up revealed right-sided loculated pleural effusion with concern for empyema. IR at [facility] attempted ultrasound-guided chest tube placement but were unable due to thickness of pleural fluid. He was evaluated by general surgery at [facility] who recommended transfer to [facility] for thoracic surgery evaluation. Patient was evaluated by thoracic surgery who recommended chest tube placement by IR, that was performed on 01/03/2020. ID consulted and recommended transitioning from IV Zosyn to IV ceftriaxone and oral Flagyl while in-house and discharge on p.o. Augmentin for a total of 6 weeks with start date of 01/03/2020. Right-sided chest tube removed 12/16. TCTS signed off 12/17 and advised no outpatient follow-up needed.  Patient stable for discharge.  PT/OT recommending SNF.  Patient will need long-term Medicaid in  place before can discharge.  Subjective: Awakened from sleep.  No specific complaints verbalized.  Objective: Vitals:   01/26/20 1959 01/27/20 0501  BP: 138/81 (!) 153/80  Pulse: 68 63  Resp: 17 16  Temp: 98.6 F (37 C) 98.1 F (36.7 C)  SpO2: 100% 100%    Intake/Output Summary (Last 24 hours) at 01/27/2020 1211 Last data filed at 01/27/2020 0500 Gross per 24 hour  Intake --  Output 1150 ml  Net -1150 ml   Filed Weights   01/03/20 1414  Weight: 66.8 kg    Exam: Constitutional: Awakened, calm and in no acute distress Respiratory: Lateral lung sounds clear to auscultation, no increased work of breathing on room air. Cardiovascular: S1-S2 with no murmurs, pulses regular, no peripheral edema Abdomen: Soft, nontender nondistended.  Tolerating diet. LBM 12/25 Neurologic: CN 2-12 grossly intact. Sensation intact, DTR normal. Strength 5/5 x on left slightly weaker 4/5 on the right Psychiatric: Alert and oriented x name only. Normal mood.    Assessment/Plan: Acute problems: Acute hypoxemic respiratory failure  2/2right-sided parapneumonic effusion, empyema  -Hypoxemia (resolved) -TCTS consulted -s/p pigtail chest tube on 01/03/2020 and was removed on 12/16. -Follow-up chest x-ray 12/17 without pneumothorax.   -Thoracic surgery signed off 12/17, prn OP follow-up recommend  -Continue antibiotics (currently on ceftriaxone and metronidazole initially was treated with IV Zosyn beginning on 12/2) for a total of 4 weeks with expected last dose 02/02/2020 (can transition to Augmentin to complete treatment if loses IV site)  Physical deconditioning -PT and OT recommending SNF. -Continues to require considerable assistance to power up for sit to stand and to maintain balance during ambulation.  Movement documented as slow and almost bradykinetic.  Requires rolling walker for mobility  Hypertension -Controlled off antihypertensive medication  Severe protein calorie  malnutrition Nutrition  Problem: Severe Malnutrition Etiology: chronic illness (CVA) Signs/Symptoms: moderate fat depletion,severe fat depletion,moderate muscle depletion,severe muscle depletion Interventions: Ensure Enlive (each supplement provides 350kcal and 20 grams of protein),Magic cup,MVI -As of 12/23 patient able to feed self and eating meals well (between 50% and 100% of meals over the past 24 hours) Estimated body mass index is 21.13 kg/m as calculated from the following:   Height as of this encounter: 5\' 10"  (1.778 m).   Weight as of this encounter: 66.8 kg.   Wounds: Incision (Closed) 01/03/20 (Active)  Date First Assessed/Time First Assessed: 01/03/20 2000      Assessments 01/03/2020  8:00 PM 01/27/2020 11:00 AM  Dressing Type -- Gauze (Comment)  Dressing Dry;Clean;Intact Clean;Dry;Intact  Site / Wound Assessment Dressing in place / Unable to assess --  Drainage Description Serosanguineous --     No Linked orders to display     Other problems: Acute kidney injury -Resolved -Current creatinine 1.01 with a GFR greater than 60  History of CVA/dementia without behavioral disturbances -Remains normotensive off of BP medication -Continue Plavix and low-dose aspirin -Continue statin/Crestor -Continue Cymbalta  Anemia of chronic disease -Hemoglobin stable around 10   Data Reviewed: Basic Metabolic Panel: Recent Labs  Lab 01/21/20 0055 01/22/20 0102 01/23/20 0039 01/26/20 0403  NA 138 137  --  136  K 4.6 4.3  --  4.6  CL 103 103  --  104  CO2 25 25  --  23  GLUCOSE 103* 98  --  83  BUN 22 24*  --  18  CREATININE 1.09 1.01 1.02 1.00  CALCIUM 8.3* 8.2*  --  8.4*   Liver Function Tests: Recent Labs  Lab 01/26/20 0403  AST 23  ALT 12  ALKPHOS 52  BILITOT 0.6  PROT 6.5  ALBUMIN 2.1*   No results for input(s): LIPASE, AMYLASE in the last 168 hours. No results for input(s): AMMONIA in the last 168 hours. CBC: Recent Labs  Lab 01/21/20 0055  01/22/20 0102 01/23/20 0039 01/26/20 0403  WBC 12.2* 10.6* 10.5 10.4  NEUTROABS 8.8*  --  7.0  --   HGB 10.7* 10.3* 10.5* 10.9*  HCT 33.6* 34.2* 32.9* 33.6*  MCV 81.8 83.0 81.8 81.4  PLT 292 283 264 273   Cardiac Enzymes: No results for input(s): CKTOTAL, CKMB, CKMBINDEX, TROPONINI in the last 168 hours. BNP (last 3 results) Recent Labs    12/26/19 1510 01/01/20 1018  BNP 88.7 177.1*    ProBNP (last 3 results) No results for input(s): PROBNP in the last 8760 hours.  CBG: No results for input(s): GLUCAP in the last 168 hours.  No results found for this or any previous visit (from the past 240 hour(s)).   Studies: No results found.  Scheduled Meds: . aspirin EC  81 mg Oral Daily  . clopidogrel  75 mg Oral Daily  . DULoxetine  60 mg Oral Daily  . enoxaparin (LOVENOX) injection  40 mg Subcutaneous Daily  . feeding supplement  237 mL Oral TID BM  . metroNIDAZOLE  500 mg Oral Q8H  . multivitamin with minerals  1 tablet Oral Daily  . rosuvastatin  10 mg Oral Daily   Continuous Infusions: . cefTRIAXone (ROCEPHIN)  IV 2 g (01/27/20 0944)    Active Problems:   Empyema of lung ([place])   Severe sepsis without septic shock ([place])   History of CVA in adulthood   Physical deconditioning   Consultants:  TCTS  Conventional radiology  Procedures:  Chest tube  placement 12/3 by IR  Antibiotics: Anti-infectives (From admission, onward)   Start     Dose/Rate Route Frequency Ordered Stop   01/12/20 1400  metroNIDAZOLE (FLAGYL) tablet 500 mg        500 mg Oral Every 8 hours 01/12/20 1111     01/12/20 1300  cefTRIAXone (ROCEPHIN) 2 g in sodium chloride 0.9 % 100 mL IVPB        2 g 200 mL/hr over 30 Minutes Intravenous Every 24 hours 01/12/20 1111     01/02/20 2330  piperacillin-tazobactam (ZOSYN) IVPB 3.375 g  Status:  Discontinued        3.375 g 12.5 mL/hr over 240 Minutes Intravenous Every 8 hours 01/02/20 2242 01/12/20 1111       Time spent: 30  minutes    Erin Hearing ANP  [facility] 7 am- 330 pm/M-F 25 days

## 2020-01-27 NOTE — Progress Notes (Signed)
Nutrition Follow-up  DOCUMENTATION CODES:   Severe malnutrition in context of chronic illness  INTERVENTION:   -ContinueEnsure Enlive poTID, each supplement provides 350 kcal and 20 grams of protein -ContinueMagic cup TID with meals, each supplement provides 290 kcal and 9 grams of protein -Continue MVI with minerals daily  NUTRITION DIAGNOSIS:   Severe Malnutrition related to chronic illness (CVA) as evidenced by moderate fat depletion,severe fat depletion,moderate muscle depletion,severe muscle depletion.  Ongoing  GOAL:   Patient will meet greater than or equal to 90% of their needs  Progressing   MONITOR:   PO intake,Supplement acceptance,Labs,Weight trends,Skin,I & O's  REASON FOR ASSESSMENT:   Malnutrition Screening Tool    ASSESSMENT:   [name] [name] is a 79 y.o. male with medical history significant for CVA in October 2021 with residual dysarthria, essential hypertension, hyperlipidemia, spinal stenosis who initially presented at [facility] from [facility] SNF due to confusion and right-sided pneumonia.  12/3- s/pPlacement of a rightchest tube. 44F Thal. To pleural evac system. 12/16- chest tube removed  Reviewed I/O's: -1.2 L x 24 hours and -8 L since 01/13/20  UOP: 1.4 L x 24 hours  Spoke with pt at bedside, who reports "I'm doing good". Noted breakfast tray in front of pt- he consumed almost all of his sausage and 50% of pancake. Pt reports he is consuming Ensure supplements- noted open container with about 25% consumed on tray table. RD assisted pt with opening a container of applesauce and obtaining an additional spoon.   Meal completions documented at 25=1--%, averaging at 50-75%.   Per TOC notes, pt awaiting long term SNF placement.   Labs reviewed.   Diet Order:   Diet Order            Diet regular Room service appropriate? Yes; Fluid consistency: Thin  Diet effective now                 EDUCATION  NEEDS:   Education needs have been addressed  Skin:  Skin Assessment: Skin Integrity Issues: Skin Integrity Issues:: Other (Comment) Other: s/p chest tube placement  Last BM:  01/25/20  Height:   Ht Readings from Last 1 Encounters:  01/03/20 5\' 10"  (1.778 m)    Weight:   Wt Readings from Last 1 Encounters:  01/03/20 66.8 kg    Ideal Body Weight:  74.5 kg  BMI:  Body mass index is 21.13 kg/m.  Estimated Nutritional Needs:   Kcal:  2150-2350  Protein:  120-135 grams  Fluid:  > 2 L    01-08-1990, RD, LDN, CDCES Registered Dietitian II Certified Diabetes Care and Education Specialist Please refer to [facility] for RD and/or RD on-call/weekend/after hours pager

## 2020-01-28 DIAGNOSIS — J869 Pyothorax without fistula: Secondary | ICD-10-CM | POA: Diagnosis not present

## 2020-01-28 DIAGNOSIS — Z8673 Personal history of transient ischemic attack (TIA), and cerebral infarction without residual deficits: Secondary | ICD-10-CM | POA: Diagnosis not present

## 2020-01-28 DIAGNOSIS — R5381 Other malaise: Secondary | ICD-10-CM | POA: Diagnosis not present

## 2020-01-28 NOTE — Progress Notes (Signed)
[facility] PROGRESS NOTE  [name] [name] QMV:[id number] DOB: 03-07-1940 DOA: 01/02/2020 PCP: [place], [name], MD  Status: Remains inpatient appropriate because:Unsafe d/c plan   Dispo:  Patient From:  Private residence  Planned Disposition:  SNF  Expected discharge date: 01/30/2020  Medically stable for discharge:  Yes; barriers to discharge: Needs SNF placement but Medicaid application in process and is pending.  Patient's son Janyth Pupa will be assisting with this process; patient sister has also been assisting    Code Status: DNR Family Communication: Son Janyth Pupa 12/28 DVT prophylaxis: Lovenox Vaccination status: Covid vaccination status unknown, has received influenza vaccine as well as pneumococcal vaccine in October 2021  Foley catheter: No   HPI: 79 year old male with history of recent CVA in 11/2019 with residual dysarthria, hypertension, hyperlipidemia and spinal stenosis, initially presented to [facility] from [facility] SNF with confusion and difficulty breathing. Work-up revealed right-sided loculated pleural effusion with concern for empyema. IR at [facility] attempted ultrasound-guided chest tube placement but were unable due to thickness of pleural fluid. He was evaluated by general surgery at [facility] who recommended transfer to [facility] for thoracic surgery evaluation. Patient was evaluated by thoracic surgery who recommended chest tube placement by IR, that was performed on 01/03/2020. ID consulted and recommended transitioning from IV Zosyn to IV ceftriaxone and oral Flagyl while in-house and discharge on p.o. Augmentin for a total of 6 weeks with start date of 01/03/2020. Right-sided chest tube removed 12/16. TCTS signed off 12/17 and advised no outpatient follow-up needed.  Patient stable for discharge.  PT/OT recommending SNF.  Patient will need long-term Medicaid in place before can discharge.  Subjective: Awake.  No specific complaints reported.  Breakfast  tray set up for patient.  Objective: Vitals:   01/27/20 2016 01/28/20 0541  BP: 139/83 133/70  Pulse: 72 66  Resp: 18 17  Temp: 97.9 F (36.6 C) 97.9 F (36.6 C)  SpO2: 99% 100%    Intake/Output Summary (Last 24 hours) at 01/28/2020 1143 Last data filed at 01/28/2020 1046 Gross per 24 hour  Intake 360 ml  Output 950 ml  Net -590 ml   Filed Weights   01/03/20 1414  Weight: 66.8 kg    Exam: Constitutional: Awakened, calm and in no acute distress Respiratory: Bilateral lung sounds are clear to auscultation on room air with O2 saturations are sat.  No increased work of breathing at rest. Cardiovascular: Pulses regular and nontachycardic, heart sounds S1-S2 without S3, no peripheral edema Abdomen: Soft, nontender nondistended.  Tolerating diet. LBM 12/27 Neurologic: CN 2-12 grossly intact. Sensation intact, DTR normal. Strength 5/5 x on left slightly weaker 4/5 on the right Psychiatric: Alert and oriented x name only. Normal mood.    Assessment/Plan: Acute problems: Acute hypoxemic respiratory failure  2/2right-sided parapneumonic effusion, empyema  -Hypoxemia (resolved)-TCTS consulted -s/p pigtail chest tube on 01/03/2020 and was removed on 12/16. -Follow-up chest x-ray 12/17 without pneumothorax.   -Thoracic surgery signed off 12/17, prn OP follow-up recommend  -Continue antibiotics (currently on ceftriaxone and metronidazole initially was treated with IV Zosyn beginning on 12/2) for a total of 4 weeks with expected last dose 02/02/2020 (can transition to Augmentin to complete treatment if loses IV site)  Physical deconditioning -PT and OT recommending SNF. -Therapy reports slow progress.  Patient mildly confused throughout session but follow commands with extra time.  He was able to take several small steps to ambulate to recliner with rolling walker, moderate assistance to steady and assistance with left lower extremity  about 25% of the time to advance.  He was notably weak on  the left.  Hypertension -Controlled off antihypertensive medication  Severe protein calorie malnutrition Nutrition Problem: Severe Malnutrition Etiology: chronic illness (CVA) Signs/Symptoms: moderate fat depletion,severe fat depletion,moderate muscle depletion,severe muscle depletion Interventions: Ensure Enlive (each supplement provides 350kcal and 20 grams of protein),Magic cup,MVI -As of 12/23 patient able to feed self and eating meals well (between 50% and 100% of meals over the past 24 hours) Estimated body mass index is 21.13 kg/m as calculated from the following:   Height as of this encounter: 5\' 10"  (1.778 m).   Weight as of this encounter: 66.8 kg.   Wounds: Incision (Closed) 01/03/20 (Active)  Date First Assessed/Time First Assessed: 01/03/20 2000      Assessments 01/03/2020  8:00 PM 01/28/2020 10:46 AM  Dressing Type -- Gauze (Comment)  Dressing Dry;Clean;Intact --  Site / Wound Assessment Dressing in place / Unable to assess --  Drainage Description Serosanguineous --     No Linked orders to display     Other problems: Acute kidney injury -Resolved -Current creatinine 1.01 with a GFR greater than 60  History of CVA/dementia without behavioral disturbances -Remains normotensive off of BP medication -Continue Plavix and low-dose aspirin -Continue statin/Crestor -Continue Cymbalta  Anemia of chronic disease -Hemoglobin stable around 10   Data Reviewed: Basic Metabolic Panel: Recent Labs  Lab 01/22/20 0102 01/23/20 0039 01/26/20 0403  NA 137  --  136  K 4.3  --  4.6  CL 103  --  104  CO2 25  --  23  GLUCOSE 98  --  83  BUN 24*  --  18  CREATININE 1.01 1.02 1.00  CALCIUM 8.2*  --  8.4*   Liver Function Tests: Recent Labs  Lab 01/26/20 0403  AST 23  ALT 12  ALKPHOS 52  BILITOT 0.6  PROT 6.5  ALBUMIN 2.1*   No results for input(s): LIPASE, AMYLASE in the last 168 hours. No results for input(s): AMMONIA in the last 168 hours. CBC: Recent  Labs  Lab 01/22/20 0102 01/23/20 0039 01/26/20 0403  WBC 10.6* 10.5 10.4  NEUTROABS  --  7.0  --   HGB 10.3* 10.5* 10.9*  HCT 34.2* 32.9* 33.6*  MCV 83.0 81.8 81.4  PLT 283 264 273   Cardiac Enzymes: No results for input(s): CKTOTAL, CKMB, CKMBINDEX, TROPONINI in the last 168 hours. BNP (last 3 results) Recent Labs    12/26/19 1510 01/01/20 1018  BNP 88.7 177.1*    ProBNP (last 3 results) No results for input(s): PROBNP in the last 8760 hours.  CBG: No results for input(s): GLUCAP in the last 168 hours.  No results found for this or any previous visit (from the past 240 hour(s)).   Studies: No results found.  Scheduled Meds: . aspirin EC  81 mg Oral Daily  . clopidogrel  75 mg Oral Daily  . DULoxetine  60 mg Oral Daily  . enoxaparin (LOVENOX) injection  40 mg Subcutaneous Daily  . feeding supplement  237 mL Oral TID BM  . metroNIDAZOLE  500 mg Oral Q8H  . multivitamin with minerals  1 tablet Oral Daily  . rosuvastatin  10 mg Oral Daily   Continuous Infusions: . cefTRIAXone (ROCEPHIN)  IV 2 g (01/28/20 [id number])    Active Problems:   Empyema of lung ([facility])   Severe sepsis without septic shock ([place])   History of CVA in adulthood   Physical deconditioning   Consultants:  TCTS  Conventional radiology  Procedures:  Chest tube placement 12/3 by IR  Antibiotics: Anti-infectives (From admission, onward)   Start     Dose/Rate Route Frequency Ordered Stop   01/12/20 1400  metroNIDAZOLE (FLAGYL) tablet 500 mg        500 mg Oral Every 8 hours 01/12/20 1111     01/12/20 1300  cefTRIAXone (ROCEPHIN) 2 g in sodium chloride 0.9 % 100 mL IVPB        2 g 200 mL/hr over 30 Minutes Intravenous Every 24 hours 01/12/20 1111     01/02/20 2330  piperacillin-tazobactam (ZOSYN) IVPB 3.375 g  Status:  Discontinued        3.375 g 12.5 mL/hr over 240 Minutes Intravenous Every 8 hours 01/02/20 2242 01/12/20 1111       Time spent: 20 minutes    Erin Hearing ANP  [facility] 7 am- 330 pm/M-F 26 days

## 2020-01-28 NOTE — TOC Progression Note (Signed)
Transition of Care [facility]) - Progression Note    Patient Details  Name: Omer Puccinelli MRN: [id number] Date of Birth: 03-24-1940  Transition of Care [facility]) CM/SW Contact  Jimmy Picket, [place] Phone Number: 01/28/2020, 3:14 PM  Clinical Narrative:      CSW reached out to pts son to follow up about medicaid application. Mr Tiburcio Pea did not answer, csw left message.   Per financial counseling, screening letter has been mailed out.    Barriers to Discharge: Continued Medical Work up,SNF Pending bed offer  Expected Discharge Plan and Services         Living arrangements for the past 2 months: Skilled Nursing Facility                                       Social Determinants of Health (SDOH) Interventions    Readmission Risk Interventions No flowsheet data found.  Jimmy Picket, Theresia Majors, [place] Clinical Social Worker [phone]

## 2020-01-29 DIAGNOSIS — Z8673 Personal history of transient ischemic attack (TIA), and cerebral infarction without residual deficits: Secondary | ICD-10-CM | POA: Diagnosis not present

## 2020-01-29 DIAGNOSIS — R5381 Other malaise: Secondary | ICD-10-CM | POA: Diagnosis not present

## 2020-01-29 DIAGNOSIS — J869 Pyothorax without fistula: Secondary | ICD-10-CM | POA: Diagnosis not present

## 2020-01-29 NOTE — Progress Notes (Signed)
[facility] PROGRESS NOTE  [name] [name] [id number] DOB: 1940-12-27 DOA: 01/02/2020 PCP: [name], [name], MD  Status: Remains inpatient appropriate because:Unsafe d/c plan   Dispo:  Patient From:  Private residence  Planned Disposition:  SNF  Expected discharge date: 02/04/2020  Medically stable for discharge:  Yes; barriers to discharge: Needs SNF placement but Medicaid application in process and is pending.  Patient's son [name] will be assisting with this process; patient sister has also been assisting -CSW attempted to contact son on 12/28 with no response.   Code Status: DNR Family Communication: Son [name] 12/28 DVT prophylaxis: Lovenox Vaccination status: Covid vaccination status unknown, has received influenza vaccine as well as pneumococcal vaccine in October 2021  Foley catheter: No   HPI: 79 year old male with history of recent CVA in 11/2019 with residual dysarthria, hypertension, hyperlipidemia and spinal stenosis, initially presented to [facility] from [facility] SNF with confusion and difficulty breathing. Work-up revealed right-sided loculated pleural effusion with concern for empyema. IR at [facility] attempted ultrasound-guided chest tube placement but were unable due to thickness of pleural fluid. He was evaluated by general surgery at [facility] who recommended transfer to [facility] for thoracic surgery evaluation. Patient was evaluated by thoracic surgery who recommended chest tube placement by IR, that was performed on 01/03/2020. ID consulted and recommended transitioning from IV Zosyn to IV ceftriaxone and oral Flagyl while in-house and discharge on p.o. Augmentin for a total of 6 weeks with start date of 01/03/2020. Right-sided chest tube removed 12/16. TCTS signed off 12/17 and advised no outpatient follow-up needed.  Patient stable for discharge.  PT/OT recommending SNF.  Patient will need long-term Medicaid in place before can  discharge.  Subjective: Awakened.  No specific complaints.  Objective: Vitals:   01/28/20 2125 01/29/20 0531  BP: (!) 150/79 109/68  Pulse: 82 69  Resp: 17 15  Temp: 98.6 F (37 C) (!) 97.4 F (36.3 C)  SpO2: 99% 97%    Intake/Output Summary (Last 24 hours) at 01/29/2020 1243 Last data filed at 01/29/2020 0531 Gross per 24 hour  Intake 440 ml  Output 1100 ml  Net -660 ml   Filed Weights   01/03/20 1414  Weight: 66.8 kg    Exam: Constitutional: Awakened, calm Respiratory: Stable on room air with O2 saturations, sounds are clear bilaterally Cardiovascular: Heart sounds are normal S1-S2 without any murmurs, no peripheral edema no JVD, regular and nontachycardic Abdomen: Bowel sounds present, abdomen soft nontender nondistended.  Tolerating diet.  LBM 12/27 Neurologic: CN 2-12 grossly intact. Sensation intact,  Strength 5/5 x on left slightly weaker 4/5 on the right Psychiatric: Alert and oriented x name only.  Flat affect.   Assessment/Plan: Acute problems: Acute hypoxemic respiratory failure  2/2right-sided parapneumonic effusion, empyema  -Hypoxemia (resolved)-TCTS consulted -s/p pigtail chest tube on 01/03/2020 and was removed on 12/16. -Follow-up chest x-ray 12/17 without pneumothorax.   -Thoracic surgery signed off 12/17, prn OP follow-up recommend  -Continue antibiotics (currently on ceftriaxone and metronidazole initially was treated with IV Zosyn beginning on 12/2) for a total of 4 weeks with expected last dose 02/02/2020 (can transition to Augmentin to complete treatment if loses IV site)  Physical deconditioning -PT and OT recommending SNF. -Therapy reports slow progress.  Patient mildly confused throughout session but follow commands with extra time.  He was able to take several small steps to ambulate to recliner with rolling walker, moderate assistance to steady and assistance with left lower extremity about 25% of the time to  advance.  He was notably weak on the  left.  Hypertension -Controlled off antihypertensive medication  Severe protein calorie malnutrition Nutrition Problem: Severe Malnutrition Etiology: chronic illness (CVA) Signs/Symptoms: moderate fat depletion,severe fat depletion,moderate muscle depletion,severe muscle depletion Interventions: Ensure Enlive (each supplement provides 350kcal and 20 grams of protein),Magic cup,MVI -As of 12/23 patient able to feed self and eating meals well (between 50% and 100% of meals over the past 24 hours) Estimated body mass index is 21.13 kg/m as calculated from the following:   Height as of this encounter: 5\' 10"  (1.778 m).   Weight as of this encounter: 66.8 kg.   Wounds: Incision (Closed) 01/03/20 (Active)  Date First Assessed/Time First Assessed: 01/03/20 2000      Assessments 01/03/2020  8:00 PM 01/28/2020  7:56 PM  Dressing Type -- Gauze (Comment)  Dressing Dry;Clean;Intact Clean;Dry;Intact  Site / Wound Assessment Dressing in place / Unable to assess Clean;Dry  Drainage Description Serosanguineous --     No Linked orders to display     Other problems: Acute kidney injury -Resolved -Current creatinine 1.01 with a GFR greater than 60  History of CVA/dementia without behavioral disturbances -Remains normotensive off of BP medication -Continue Plavix and low-dose aspirin -Continue statin/Crestor -Continue Cymbalta  Anemia of chronic disease -Hemoglobin stable around 10   Data Reviewed: Basic Metabolic Panel: Recent Labs  Lab 01/23/20 0039 01/26/20 0403  NA  --  136  K  --  4.6  CL  --  104  CO2  --  23  GLUCOSE  --  83  BUN  --  18  CREATININE 1.02 1.00  CALCIUM  --  8.4*   Liver Function Tests: Recent Labs  Lab 01/26/20 0403  AST 23  ALT 12  ALKPHOS 52  BILITOT 0.6  PROT 6.5  ALBUMIN 2.1*   No results for input(s): LIPASE, AMYLASE in the last 168 hours. No results for input(s): AMMONIA in the last 168 hours. CBC: Recent Labs  Lab 01/23/20 0039  01/26/20 0403  WBC 10.5 10.4  NEUTROABS 7.0  --   HGB 10.5* 10.9*  HCT 32.9* 33.6*  MCV 81.8 81.4  PLT 264 273   Cardiac Enzymes: No results for input(s): CKTOTAL, CKMB, CKMBINDEX, TROPONINI in the last 168 hours. BNP (last 3 results) Recent Labs    12/26/19 1510 01/01/20 1018  BNP 88.7 177.1*    ProBNP (last 3 results) No results for input(s): PROBNP in the last 8760 hours.  CBG: No results for input(s): GLUCAP in the last 168 hours.  No results found for this or any previous visit (from the past 240 hour(s)).   Studies: No results found.  Scheduled Meds: . aspirin EC  81 mg Oral Daily  . clopidogrel  75 mg Oral Daily  . DULoxetine  60 mg Oral Daily  . enoxaparin (LOVENOX) injection  40 mg Subcutaneous Daily  . feeding supplement  237 mL Oral TID BM  . metroNIDAZOLE  500 mg Oral Q8H  . multivitamin with minerals  1 tablet Oral Daily  . rosuvastatin  10 mg Oral Daily   Continuous Infusions: . cefTRIAXone (ROCEPHIN)  IV 2 g (01/29/20 1139)    Active Problems:   Empyema of lung (HCC)   Severe sepsis without septic shock (HCC)   History of CVA in adulthood   Physical deconditioning   Consultants:  TCTS  Conventional radiology  Procedures:  Chest tube placement 12/3 by IR  Antibiotics: Anti-infectives (From admission, onward)   Start  Dose/Rate Route Frequency Ordered Stop   01/12/20 1400  metroNIDAZOLE (FLAGYL) tablet 500 mg        500 mg Oral Every 8 hours 01/12/20 1111     01/12/20 1300  cefTRIAXone (ROCEPHIN) 2 g in sodium chloride 0.9 % 100 mL IVPB        2 g 200 mL/hr over 30 Minutes Intravenous Every 24 hours 01/12/20 1111     01/02/20 2330  piperacillin-tazobactam (ZOSYN) IVPB 3.375 g  Status:  Discontinued        3.375 g 12.5 mL/hr over 240 Minutes Intravenous Every 8 hours 01/02/20 2242 01/12/20 1111       Time spent: 20 minutes    Erin Hearing ANP  [facility] 7 am- 330 pm/M-F 27 days

## 2020-01-30 DIAGNOSIS — Z8673 Personal history of transient ischemic attack (TIA), and cerebral infarction without residual deficits: Secondary | ICD-10-CM | POA: Diagnosis not present

## 2020-01-30 DIAGNOSIS — J869 Pyothorax without fistula: Secondary | ICD-10-CM | POA: Diagnosis not present

## 2020-01-30 DIAGNOSIS — R5381 Other malaise: Secondary | ICD-10-CM | POA: Diagnosis not present

## 2020-01-30 LAB — CREATININE, SERUM
Creatinine, Ser: 1.08 mg/dL (ref 0.61–1.24)
GFR, Estimated: >60 mL/min (ref >60)

## 2020-01-30 NOTE — Progress Notes (Signed)
Physical Therapy Treatment Patient Details Name: [name] [name] MRN: [id number] DOB: [birth year] Today's Date: 01/30/2020    History of Present Illness Pt is a 79 y.o. male (from SNF) admitted 01/02/20 as transfer from [facility] (admitted there 12/26/19) for CTS evaluation for possible VATS decortication. Workup revealed R-side loculated pleural efffusion with concern for empyema, possible PNA. PMH includes lumbar stenosis, renal disorder.    PT Comments    Pt is up to side of bed with assistance and trying to help with standing.  He is not safe to leave unattended on side of bed as he is trying to move alone, and would not be able to make safe decisions about his ability to stand.  Pt assisted to walk to the chair with help to get LE's into reciprocal sequence, and with extra time and dense cues was successful.  Follow up with him to continue more standing and balance skills, and redirect as needed to stay focused on safety and sequence.  Pt is fully unaware of his episodes of incontinence and will need to monitor this as well for safety.   Follow Up Recommendations  SNF     Equipment Recommendations  Rolling walker with 5" wheels;3in1 (PT)    Recommendations for Other Services       Precautions / Restrictions Precautions Precautions: Fall Precaution Comments: bladder and bowel incontinence Restrictions Weight Bearing Restrictions: No    Mobility  Bed Mobility Overal bed mobility: Needs Assistance Bed Mobility: Supine to Sit   Sidelying to sit: Min assist Supine to sit: Min assist        Transfers Overall transfer level: Needs assistance Equipment used: Rolling walker (2 wheeled) Transfers: Sit to/from Stand Sit to Stand: Mod assist Stand pivot transfers: Min assist       General transfer comment: mod to power up and min to control balance  Ambulation/Gait Ambulation/Gait assistance: Min assist;Mod assist Gait Distance (Feet): 6 Feet Assistive device: Rolling  walker (2 wheeled) Gait Pattern/deviations: Step-to pattern;Wide base of support;Shuffle;Decreased stride length Gait velocity: reduced   General Gait Details: requires extra time to clear and advance LLE but can get to chair with repeated cues and assist   Stairs             Wheelchair Mobility    Modified Rankin (Stroke Patients Only)       Balance Overall balance assessment: Needs assistance Sitting-balance support: Feet supported Sitting balance-Leahy Scale: Fair     Standing balance support: Bilateral upper extremity supported;During functional activity Standing balance-Leahy Scale: Poor                              Cognition Arousal/Alertness: Awake/alert Behavior During Therapy: Flat affect Overall Cognitive Status: History of cognitive impairments - at baseline Area of Impairment: Orientation;Attention;Memory;Following commands;Safety/judgement;Awareness;Problem solving                 Orientation Level: Time;Situation;Place Current Attention Level: Selective Memory: Decreased recall of precautions;Decreased short-term memory Following Commands: Follows one step commands inconsistently;Follows one step commands with increased time Safety/Judgement: Decreased awareness of safety;Decreased awareness of deficits Awareness: Intellectual Problem Solving: Slow processing;Requires verbal cues;Requires tactile cues General Comments: requires repetition to get to chair and safely sit      Exercises      General Comments General comments (skin integrity, edema, etc.): pt is motivated to try but requires redirection due to cognition with moments of impulsivity      Pertinent  Vitals/Pain Pain Assessment: No/denies pain    Home Living                      Prior Function            PT Goals (current goals can now be found in the care plan section) Acute Rehab PT Goals Patient Stated Goal: to get up to recliner to eat  lunch Progress towards PT goals: Progressing toward goals    Frequency    Min 2X/week      PT Plan Current plan remains appropriate    Co-evaluation              AM-PAC PT "6 Clicks" Mobility   Outcome Measure  Help needed turning from your back to your side while in a flat bed without using bedrails?: A Little Help needed moving from lying on your back to sitting on the side of a flat bed without using bedrails?: A Little Help needed moving to and from a bed to a chair (including a wheelchair)?: A Lot Help needed standing up from a chair using your arms (e.g., wheelchair or bedside chair)?: A Lot Help needed to walk in hospital room?: A Lot Help needed climbing 3-5 steps with a railing? : Total 6 Click Score: 13    End of Session Equipment Utilized During Treatment: Gait belt Activity Tolerance: Patient tolerated treatment well Patient left: in chair;with call bell/phone within reach;with chair alarm set Nurse Communication: Mobility status PT Visit Diagnosis: Difficulty in walking, not elsewhere classified (R26.2);Muscle weakness (generalized) (M62.81)     Time: 8315-1761 PT Time Calculation (min) (ACUTE ONLY): 24 min  Charges:  $Gait Training: 8-22 mins $Therapeutic Activity: 8-22 mins           Ivar Drape 01/30/2020, 7:06 PM  Samul Dada, PT MS Acute Rehab Dept. Number: [facility] [id number] and [facility] [phone]

## 2020-01-30 NOTE — Progress Notes (Signed)
[facility] PROGRESS NOTE  [name] [name] ATF:[id number] DOB: 09-29-1940 DOA: 01/02/2020 PCP: [name], [name], MD  Status: Remains inpatient appropriate because:Unsafe d/c plan   Dispo:  Patient From:  Private residence  Planned Disposition:  SNF  Expected discharge date: 02/04/2020  Medically stable for discharge:  Yes; barriers to discharge: Needs SNF placement but Medicaid application in process and is pending.  Patient's son [name] will be assisting with this process; patient sister has also been assisting -CSW attempted to contact son on 12/28 with no response.   Code Status: DNR Family Communication: Son [name] 12/28 DVT prophylaxis: Lovenox Vaccination status: Covid vaccination status unknown, has received influenza vaccine as well as pneumococcal vaccine in October 2021  Foley catheter: No   HPI: 79 year old male with history of recent CVA in 11/2019 with residual dysarthria, hypertension, hyperlipidemia and spinal stenosis, initially presented to [facility] from [facility] SNF with confusion and difficulty breathing. Work-up revealed right-sided loculated pleural effusion with concern for empyema. IR at [facility] attempted ultrasound-guided chest tube placement but were unable due to thickness of pleural fluid. He was evaluated by general surgery at [facility] who recommended transfer to [facility] for thoracic surgery evaluation. Patient was evaluated by thoracic surgery who recommended chest tube placement by IR, that was performed on 01/03/2020. ID consulted and recommended transitioning from IV Zosyn to IV ceftriaxone and oral Flagyl while in-house and discharge on p.o. Augmentin for a total of 6 weeks with start date of 01/03/2020. Right-sided chest tube removed 12/16. TCTS signed off 12/17 and advised no outpatient follow-up needed.  Patient stable for discharge.  PT/OT recommending SNF.  Patient will need long-term Medicaid in place before can  discharge.  Subjective: Sleeping soundly and did not awaken during exam.  Objective: Vitals:   01/29/20 2156 01/30/20 0442  BP: 125/77 (!) 155/96  Pulse: 75 70  Resp: 15 16  Temp: 98.1 F (36.7 C) 98.2 F (36.8 C)  SpO2: 100% 99%    Intake/Output Summary (Last 24 hours) at 01/30/2020 1153 Last data filed at 01/30/2020 1020 Gross per 24 hour  Intake 600 ml  Output 850 ml  Net -250 ml   Filed Weights   01/03/20 1414  Weight: 66.8 kg    Exam: Constitutional: Sleeping soundly and appears to be comfortable Respiratory: Room air with O2 saturations 90%, lung sounds are clear bilaterally Cardiovascular: Heart sounds are normal S1-S2 without any murmurs, no peripheral edema no JVD, regular  Abdomen: Bowel sounds present, abdomen soft nontender nondistended.  Tolerating diet.  LBM 12/27 Neurologic: Sleeping but at baseline CN 2-12 grossly intact. Sensation intact,  Strength 5/5 x on left slightly weaker 4/5 on the right Psychiatric: Sleeping but at baseline alert and oriented x name only.  Flat affect.   Assessment/Plan: Acute problems: Acute hypoxemic respiratory failure  2/2right-sided parapneumonic effusion, empyema  -Hypoxemia (resolved)-TCTS consulted -s/p pigtail chest tube on 01/03/2020 and was removed on 12/16. -Follow-up chest x-ray 12/17 without pneumothorax.   -Thoracic surgery signed off 12/17, prn OP follow-up recommend  -Continue antibiotics (currently on ceftriaxone and metronidazole initially was treated with IV Zosyn beginning on 12/2) for a total of 4 weeks with expected last dose 02/02/2020 (can transition to Augmentin to complete treatment if loses IV site)  Physical deconditioning -PT and OT recommending SNF. -Therapy reports slow progress.  Patient mildly confused throughout session but follow commands with extra time.  He was able to take several small steps to ambulate to recliner with rolling walker, moderate assistance  to steady and assistance with left  lower extremity about 25% of the time to advance.  He was notably weak on the left.  Hypertension -Controlled off antihypertensive medication  Severe protein calorie malnutrition Nutrition Problem: Severe Malnutrition Etiology: chronic illness (CVA) Signs/Symptoms: moderate fat depletion,severe fat depletion,moderate muscle depletion,severe muscle depletion Interventions: Ensure Enlive (each supplement provides 350kcal and 20 grams of protein),Magic cup,MVI -As of 12/23 patient able to feed self and eating meals well (between 50% and 100% of meals over the past 24 hours) Estimated body mass index is 21.13 kg/m as calculated from the following:   Height as of this encounter: 5\' 10"  (1.778 m).   Weight as of this encounter: 66.8 kg.   Wounds: Incision (Closed) 01/03/20 (Active)  Date First Assessed/Time First Assessed: 01/03/20 2000      Assessments 01/03/2020  8:00 PM 01/28/2020  7:56 PM  Dressing Type -- Gauze (Comment)  Dressing Dry;Clean;Intact Clean;Dry;Intact  Site / Wound Assessment Dressing in place / Unable to assess Clean;Dry  Drainage Description Serosanguineous --     No Linked orders to display     Other problems: Acute kidney injury -Resolved -Current creatinine 1.01 with a GFR greater than 60  History of CVA/dementia without behavioral disturbances -Remains normotensive off of BP medication -Continue Plavix and low-dose aspirin -Continue statin/Crestor -Continue Cymbalta  Anemia of chronic disease -Hemoglobin stable around 10   Data Reviewed: Basic Metabolic Panel: Recent Labs  Lab 01/26/20 0403 01/30/20 0636  NA 136  --   K 4.6  --   CL 104  --   CO2 23  --   GLUCOSE 83  --   BUN 18  --   CREATININE 1.00 1.08  CALCIUM 8.4*  --    Liver Function Tests: Recent Labs  Lab 01/26/20 0403  AST 23  ALT 12  ALKPHOS 52  BILITOT 0.6  PROT 6.5  ALBUMIN 2.1*   No results for input(s): LIPASE, AMYLASE in the last 168 hours. No results for  input(s): AMMONIA in the last 168 hours. CBC: Recent Labs  Lab 01/26/20 0403  WBC 10.4  HGB 10.9*  HCT 33.6*  MCV 81.4  PLT 273   Cardiac Enzymes: No results for input(s): CKTOTAL, CKMB, CKMBINDEX, TROPONINI in the last 168 hours. BNP (last 3 results) Recent Labs    12/26/19 1510 01/01/20 1018  BNP 88.7 177.1*    ProBNP (last 3 results) No results for input(s): PROBNP in the last 8760 hours.  CBG: No results for input(s): GLUCAP in the last 168 hours.  No results found for this or any previous visit (from the past 240 hour(s)).   Studies: No results found.  Scheduled Meds: . aspirin EC  81 mg Oral Daily  . clopidogrel  75 mg Oral Daily  . DULoxetine  60 mg Oral Daily  . enoxaparin (LOVENOX) injection  40 mg Subcutaneous Daily  . feeding supplement  237 mL Oral TID BM  . metroNIDAZOLE  500 mg Oral Q8H  . multivitamin with minerals  1 tablet Oral Daily  . rosuvastatin  10 mg Oral Daily   Continuous Infusions: . cefTRIAXone (ROCEPHIN)  IV 2 g (01/30/20 1048)    Active Problems:   Empyema of lung (HCC)   Severe sepsis without septic shock (HCC)   History of CVA in adulthood   Physical deconditioning   Consultants:  TCTS  Conventional radiology  Procedures:  Chest tube placement 12/3 by IR  Antibiotics: Anti-infectives (From admission, onward)   Start  Dose/Rate Route Frequency Ordered Stop   01/12/20 1400  metroNIDAZOLE (FLAGYL) tablet 500 mg        500 mg Oral Every 8 hours 01/12/20 1111     01/12/20 1300  cefTRIAXone (ROCEPHIN) 2 g in sodium chloride 0.9 % 100 mL IVPB        2 g 200 mL/hr over 30 Minutes Intravenous Every 24 hours 01/12/20 1111     01/02/20 2330  piperacillin-tazobactam (ZOSYN) IVPB 3.375 g  Status:  Discontinued        3.375 g 12.5 mL/hr over 240 Minutes Intravenous Every 8 hours 01/02/20 2242 01/12/20 1111       Time spent: 20 minutes    Junious Silk ANP  [facility] 7 am- 330 pm/M-F 28 days

## 2020-01-31 DIAGNOSIS — Z8673 Personal history of transient ischemic attack (TIA), and cerebral infarction without residual deficits: Secondary | ICD-10-CM | POA: Diagnosis not present

## 2020-01-31 DIAGNOSIS — J869 Pyothorax without fistula: Secondary | ICD-10-CM | POA: Diagnosis not present

## 2020-01-31 DIAGNOSIS — R5381 Other malaise: Secondary | ICD-10-CM | POA: Diagnosis not present

## 2020-01-31 NOTE — Progress Notes (Signed)
Occupational Therapy Treatment Patient Details Name: [name] [name] MRN: [id number] DOB: 04-18-1940 Today's Date: 01/31/2020    History of present illness Pt is a 79 y.o. male (from SNF) admitted 01/02/20 as transfer from [facility] (admitted there 12/26/19) for CTS evaluation for possible VATS decortication. Workup revealed R-side loculated pleural efffusion with concern for empyema, possible PNA. PMH includes lumbar stenosis, renal disorder.   OT comments   Pt in chair poisiton in bed upon arrival finishing up breakfast needing set- up assist to apply salt to pts food. Pt continues to present with decreased activity tolerance, generalized weakness and impaired balance impacting pts ability to complete BADLs independently. Pt able to complete simulated toilet transfer to recliner with MIN A for transfer and MOD A to power into standing with RW. Pt noted to have incontinent BM during transfer needing total A for posterior pericare with pt able to maintain static standing ~ 2 mins with BUE supported on RW. Pt overall slow to process needing increased time to follow commands and requiring verbal and tactile cues.Pt would continue to benefit from skilled occupational therapy while admitted and after d/c to address the below listed limitations in order to improve overall functional mobility and facilitate independence with BADL participation. DC plan remains appropriate, will follow acutely per POC.     Follow Up Recommendations  SNF;Supervision/Assistance - 24 hour    Equipment Recommendations  Other (comment) (TBD at SNF)    Recommendations for Other Services      Precautions / Restrictions Precautions Precautions: Fall Precaution Comments: bladder and bowel incontinence Restrictions Weight Bearing Restrictions: No       Mobility Bed Mobility Overal bed mobility: Needs Assistance Bed Mobility: Supine to Sit     Supine to sit: Min assist     General bed mobility comments:  Verbal/tactile cues to sequence bed mobility with pt exiting bed to pts R side, assist to elevate trunk into sitting, pt requried MIN A to scoot hips to EOB with bed pad  Transfers Overall transfer level: Needs assistance Equipment used: Rolling walker (2 wheeled) Transfers: Sit to/from [name] Sit to Stand: Mod assist;From elevated surface Stand pivot transfers: Min assist       General transfer comment: pt sit<>stand x2 during session needing MOD A to power into standing, cues for hand placement and physcial assist needed to shift hips anteriorly when powering up. MIN A to pivot to recliner with Rw to pts R side. verbal and tactile cues needed to sequence steps to recliner and for RW mgmt    Balance Overall balance assessment: Needs assistance Sitting-balance support: Feet supported Sitting balance-Leahy Scale: Fair     Standing balance support: Bilateral upper extremity supported;During functional activity Standing balance-Leahy Scale: Poor Standing balance comment: reliant on BUE support and external assist                           ADL either performed or assessed with clinical judgement   ADL Overall ADL's : Needs assistance/impaired Eating/Feeding: Set up;Sitting Eating/Feeding Details (indicate cue type and reason): from sitting up in bed         Lower Body Bathing: Total assistance;Sit to/from stand Lower Body Bathing Details (indicate cue type and reason): simulated via toileting hygiene in standing after incontinent BM     Lower Body Dressing: Total assistance;Sit to/from stand Lower Body Dressing Details (indicate cue type and reason): total A to don new socks Toilet Transfer: Minimal assistance;Stand-pivot;RW  Toilet Transfer Details (indicate cue type and reason): simualted via stand pivot transfer from EOB>recliner, MIN A +1 with Rw, cues for RW mgmt and sequencing steps Toileting- Clothing Manipulation and Hygiene: Total  assistance;Sit to/from stand Toileting - Clothing Manipulation Details (indicate cue type and reason): total A for posterior pericare after incontinent BM     Functional mobility during ADLs: Minimal assistance;Rolling walker General ADL Comments: pt continues to present with decreased activity tolerance, generalized weakness and impaired balance     Vision       Perception     Praxis      Cognition Arousal/Alertness: Awake/alert Behavior During Therapy: Flat affect Overall Cognitive Status: History of cognitive impairments - at baseline                                 General Comments: pt very slow to process and follow commands        Exercises Other Exercises Other Exercises: ankle pumps from supine x10 reps, BLEs Other Exercises: LAQ from EOB, BLEs, x10 reps Other Exercises: seated marches from EOB, BLEs, x10 reps   Shoulder Instructions       General Comments      Pertinent Vitals/ Pain       Pain Assessment: No/denies pain  Home Living                                          Prior Functioning/Environment              Frequency  Min 2X/week        Progress Toward Goals  OT Goals(current goals can now be found in the care plan section)  Progress towards OT goals: Progressing toward goals  Acute Rehab OT Goals Patient Stated Goal: to get up to recliner to eat lunch OT Goal Formulation: With patient Time For Goal Achievement: 02/07/20 Potential to Achieve Goals: [place] Discharge plan remains appropriate;Frequency remains appropriate    Co-evaluation                 AM-PAC OT "6 Clicks" Daily Activity     Outcome Measure   Help from another person eating meals?: A Little (s/u) Help from another person taking care of personal grooming?: A Little Help from another person toileting, which includes using toliet, bedpan, or urinal?: A Lot Help from another person bathing (including washing, rinsing,  drying)?: A Lot Help from another person to put on and taking off regular upper body clothing?: A Lot Help from another person to put on and taking off regular lower body clothing?: Total 6 Click Score: 13    End of Session Equipment Utilized During Treatment: Gait belt;Rolling walker  OT Visit Diagnosis: Unsteadiness on feet (R26.81);Other abnormalities of gait and mobility (R26.89);Muscle weakness (generalized) (M62.81);Other symptoms and signs involving cognitive function   Activity Tolerance Patient tolerated treatment well   Patient Left in chair;with call bell/phone within reach;with chair alarm set   Nurse Communication Mobility status;Other (comment) (cleaned up pt after incontinent BM)        Time: [id number] OT Time Calculation (min): 31 min  Charges: OT General Charges $OT Visit: 1 Visit OT Treatments $Self Care/Home Management : 23-37 mins  Harley Alto., COTA/L Acute Rehabilitation Services [phone]    Precious Haws 01/31/2020, 1:30 PM

## 2020-01-31 NOTE — Progress Notes (Signed)
Nutrition Follow-up  DOCUMENTATION CODES:   Severe malnutrition in context of chronic illness  INTERVENTION:   -ContinueEnsure Enlive poTID, each supplement provides 350 kcal and 20 grams of protein -ContinueMagic cup TID with meals, each supplement provides 290 kcal and 9 grams of protein -Continue MVI with minerals daily  NUTRITION DIAGNOSIS:   Severe Malnutrition related to chronic illness (CVA) as evidenced by moderate fat depletion,severe fat depletion,moderate muscle depletion,severe muscle depletion.  Ongoing  GOAL:   Patient will meet greater than or equal to 90% of their needs  Progressing   MONITOR:   PO intake,Supplement acceptance,Labs,Weight trends,Skin,I & O's  REASON FOR ASSESSMENT:   Malnutrition Screening Tool    ASSESSMENT:   [name] [name] is a 79 y.o. male with medical history significant for CVA in October 2021 with residual dysarthria, essential hypertension, hyperlipidemia, spinal stenosis who initially presented at [facility] from [facility] SNF due to confusion and right-sided pneumonia.  12/3- s/pPlacement of a rightchest tube. 22F Thal. To pleural evac system. 12/16- chest tube removed  Reviewed I/O's: -860 ml x 24 hours and -9.9 L since 01/17/20  UOP: 1.1 L x 24 hours  Pt receiving nursing care at time of visit.   Pt with improved oral intake. Noted meal completion 50-100%. Pt is also consuming Ensure supplements.  Per TOC notes, pt awaiting medicaid application and long term SNF placement.   Labs reviewed.  Diet Order:   Diet Order            Diet regular Room service appropriate? Yes; Fluid consistency: Thin  Diet effective now                 EDUCATION NEEDS:   Education needs have been addressed  Skin:  Skin Assessment: Skin Integrity Issues: Skin Integrity Issues:: Other (Comment) Other: chest tube removed  Last BM:  01/31/20  Height:   Ht Readings from Last 1 Encounters:   01/03/20 5\' 10"  (1.778 m)    Weight:   Wt Readings from Last 1 Encounters:  01/03/20 66.8 kg    Ideal Body Weight:  74.5 kg  BMI:  Body mass index is 21.13 kg/m.  Estimated Nutritional Needs:   Kcal:  2150-2350  Protein:  120-135 grams  Fluid:  > 2 L    01-08-1990, RD, LDN, CDCES Registered Dietitian II Certified Diabetes Care and Education Specialist Please refer to [facility] for RD and/or RD on-call/weekend/after hours pager

## 2020-01-31 NOTE — Progress Notes (Signed)
[facility] PROGRESS NOTE  [name] [name] [id number] DOB: 01/14/1941 DOA: 01/02/2020 PCP: [name], [name], MD  Status: Remains inpatient appropriate because:Unsafe d/c plan   Dispo:  Patient From:  Private residence  Planned Disposition:  SNF  Expected discharge date: 02/04/2020  Medically stable for discharge:  Yes; barriers to discharge: Needs SNF placement but Medicaid application in process and is pending.  Patient's son [name] will be assisting with this process; patient sister has also been assisting -CSW attempted to contact son on 12/28 with no response.   Code Status: DNR Family Communication: Son [name] 12/28 DVT prophylaxis: Lovenox Vaccination status: Covid vaccination status unknown, has received influenza vaccine as well as pneumococcal vaccine in October 2021  Foley catheter: No   HPI: 79 year old male with history of recent CVA in 11/2019 with residual dysarthria, hypertension, hyperlipidemia and spinal stenosis, initially presented to [facility] from [facility] SNF with confusion and difficulty breathing. Work-up revealed right-sided loculated pleural effusion with concern for empyema. IR at [facility] attempted ultrasound-guided chest tube placement but were unable due to thickness of pleural fluid. He was evaluated by general surgery at [facility] who recommended transfer to [facility] for thoracic surgery evaluation. Patient was evaluated by thoracic surgery who recommended chest tube placement by IR, that was performed on 01/03/2020. ID consulted and recommended transitioning from IV Zosyn to IV ceftriaxone and oral Flagyl while in-house and discharge on p.o. Augmentin for a total of 6 weeks with start date of 01/03/2020. Right-sided chest tube removed 12/16. TCTS signed off 12/17 and advised no outpatient follow-up needed.  Patient stable for discharge.  PT/OT recommending SNF.  Patient will need long-term Medicaid in place before can  discharge.  Subjective: Awake and pleasant.  No specific complaints  Objective: Vitals:   01/30/20 2105 01/31/20 0417  BP: 122/80 133/79  Pulse: 92 79  Resp: 18 16  Temp: 98.4 F (36.9 C) 98.5 F (36.9 C)  SpO2: 97% 97%    Intake/Output Summary (Last 24 hours) at 01/31/2020 1256 Last data filed at 01/31/2020 0900 Gross per 24 hour  Intake 120 ml  Output 1100 ml  Net -980 ml   Filed Weights   01/03/20 1414  Weight: 66.8 kg    Exam: Constitutional: Awake and in no acute distress Respiratory: Bilateral lung sounds clear to auscultation, room air saturation stable at 97 % Cardiovascular: Pulse regular and nontachycardic.  Normal heart sounds S1-S2 Abdomen: Bowel sounds present, abdomen soft nontender nondistended normoactive bowel sounds.  LBM 12/31 Neurologic: CN 2-12 grossly intact. Sensation intact,  Strength 5/5 x on left slightly weaker 4/5 on the right Psychiatric: alert and oriented x name only.  Not to situation either.  Flat affect but will occasionally smile.   Assessment/Plan: Acute problems: Acute hypoxemic respiratory failure  2/2right-sided parapneumonic effusion, empyema  -Hypoxemia (resolved)-TCTS consulted -s/p pigtail chest tube on 01/03/2020 and was removed on 12/16. -Follow-up chest x-ray 12/17 without pneumothorax.   -Thoracic surgery signed off 12/17, prn OP follow-up recommend  -Continue antibiotics (currently on ceftriaxone and metronidazole initially was treated with IV Zosyn beginning on 12/2) for a total of 4 weeks with expected last dose 02/02/2020 (can transition to Augmentin to complete treatment if loses IV site)  Physical deconditioning -PT and OT recommending SNF. -Therapy reports slow progress.  Patient mildly confused throughout session but follow commands with extra time.  He was able to take several small steps to ambulate to recliner with rolling walker, moderate assistance to steady and assistance with left  lower extremity about 25% of  the time to advance.  He was notably weak on the left.  Hypertension -Controlled off antihypertensive medication  Severe protein calorie malnutrition Nutrition Problem: Severe Malnutrition Etiology: chronic illness (CVA) Signs/Symptoms: moderate fat depletion,severe fat depletion,moderate muscle depletion,severe muscle depletion Interventions: Ensure Enlive (each supplement provides 350kcal and 20 grams of protein),Magic cup,MVI -As of 12/23 patient able to feed self and eating meals well (between 50% and 100% of meals over the past 24 hours) Estimated body mass index is 21.13 kg/m as calculated from the following:   Height as of this encounter: 5\' 10"  (1.778 m).   Weight as of this encounter: 66.8 kg.   Wounds: Incision (Closed) 01/03/20 (Active)  Date First Assessed/Time First Assessed: 01/03/20 2000      Assessments 01/03/2020  8:00 PM 01/28/2020  7:56 PM  Dressing Type -- Gauze (Comment)  Dressing Dry;Clean;Intact Clean;Dry;Intact  Site / Wound Assessment Dressing in place / Unable to assess Clean;Dry  Drainage Description Serosanguineous --     No Linked orders to display     Other problems: Acute kidney injury -Resolved -Current creatinine 1.01 with a GFR greater than 60  History of CVA/dementia without behavioral disturbances -Remains normotensive off of BP medication -Continue Plavix and low-dose aspirin -Continue statin/Crestor -Continue Cymbalta  Anemia of chronic disease -Hemoglobin stable around 10   Data Reviewed: Basic Metabolic Panel: Recent Labs  Lab 01/26/20 0403 01/30/20 0636  NA 136  --   K 4.6  --   CL 104  --   CO2 23  --   GLUCOSE 83  --   BUN 18  --   CREATININE 1.00 1.08  CALCIUM 8.4*  --    Liver Function Tests: Recent Labs  Lab 01/26/20 0403  AST 23  ALT 12  ALKPHOS 52  BILITOT 0.6  PROT 6.5  ALBUMIN 2.1*   No results for input(s): LIPASE, AMYLASE in the last 168 hours. No results for input(s): AMMONIA in the last 168  hours. CBC: Recent Labs  Lab 01/26/20 0403  WBC 10.4  HGB 10.9*  HCT 33.6*  MCV 81.4  PLT 273   Cardiac Enzymes: No results for input(s): CKTOTAL, CKMB, CKMBINDEX, TROPONINI in the last 168 hours. BNP (last 3 results) Recent Labs    12/26/19 1510 01/01/20 1018  BNP 88.7 177.1*    ProBNP (last 3 results) No results for input(s): PROBNP in the last 8760 hours.  CBG: No results for input(s): GLUCAP in the last 168 hours.  No results found for this or any previous visit (from the past 240 hour(s)).   Studies: No results found.  Scheduled Meds: . aspirin EC  81 mg Oral Daily  . clopidogrel  75 mg Oral Daily  . DULoxetine  60 mg Oral Daily  . enoxaparin (LOVENOX) injection  40 mg Subcutaneous Daily  . feeding supplement  237 mL Oral TID BM  . metroNIDAZOLE  500 mg Oral Q8H  . multivitamin with minerals  1 tablet Oral Daily  . rosuvastatin  10 mg Oral Daily   Continuous Infusions: . cefTRIAXone (ROCEPHIN)  IV 2 g (01/31/20 0946)    Active Problems:   Empyema of lung (HCC)   Severe sepsis without septic shock (HCC)   History of CVA in adulthood   Physical deconditioning   Consultants:  TCTS  Conventional radiology  Procedures:  Chest tube placement 12/3 by IR  Antibiotics: Anti-infectives (From admission, onward)   Start     Dose/Rate Route Frequency  Ordered Stop   01/12/20 1400  metroNIDAZOLE (FLAGYL) tablet 500 mg        500 mg Oral Every 8 hours 01/12/20 1111     01/12/20 1300  cefTRIAXone (ROCEPHIN) 2 g in sodium chloride 0.9 % 100 mL IVPB        2 g 200 mL/hr over 30 Minutes Intravenous Every 24 hours 01/12/20 1111     01/02/20 2330  piperacillin-tazobactam (ZOSYN) IVPB 3.375 g  Status:  Discontinued        3.375 g 12.5 mL/hr over 240 Minutes Intravenous Every 8 hours 01/02/20 2242 01/12/20 1111       Time spent: 20 minutes    Erin Hearing ANP  [facility] 7 am- 330 pm/M-F 29 days

## 2020-02-01 DIAGNOSIS — J869 Pyothorax without fistula: Secondary | ICD-10-CM | POA: Diagnosis not present

## 2020-02-01 DIAGNOSIS — J90 Pleural effusion, not elsewhere classified: Secondary | ICD-10-CM

## 2020-02-01 NOTE — Progress Notes (Signed)
[name] [name]  [id number] DOB: 1940/09/30 DOA: 01/02/2020 PCP: [name], [name], MD    Brief Narrative:  80 year old with a history of CVA October 2021 with residual dysarthria, HTN, HLD, and spinal stenosis who presented to [facility] with confusion and difficulty breathing.  Work-up revealed a right loculated pleural effusion/empyema.  IR at [facility] attempted US guided chest tube placement but were unsuccessful due to the thickness of the pleural fluid.  General surgery at [facility] evaluated the patient and ultimately he was transferred to [facility] for TCTS evaluation.  Chest tube was placed by IR at [facility] 12/3.  ID was also consulted and directed antibiotic therapy.  On 12/16 the patient's chest tube was able to be removed.  TCTS was able to sign off 12/17 and advised no further follow-up was needed.  Patient has remained stable for discharge for quite some time but reportedly needs long-term Medicaid in place before he can be placed in a SNF  Antimicrobials:  Zosyn 12/2 > Rocephin Flagyl  DVT prophylaxis: Lovenox  Subjective: Resting comfortably in bed.  No new complaints today.  Pleasant and interactive.  Assessment & Plan:  Right parapneumonic effusion/empyema -acute hypoxic respiratory failure Hypoxia resolved -pigtail chest tube placed 12/3 and subsequently removed 12/16 -follow-up CXR 12/17 without pneumothorax  Physical deconditioning PT and OT working with the patient and recommend SNF for safe discharge  HTN  Severe protein calorie malnutrition in context of chronic illness  Acute kidney injury Resolved  History of Lacunar CVA Oct 2021 Continue Plavix and low-dose aspirin -continue Crestor -residual dysarthria  Dementia without behavioral disturbances  Anemia of chronic disease Baseline hemoglobin approximately 10   Code Status: NO CODE BLUE Family Communication: Spoke with sister in room Status is: Inpatient  Remains inpatient appropriate because:Unsafe d/c  plan   Dispo:  Patient From:    Planned Disposition:    Expected discharge date: 02/04/2020  Medically stable for discharge:     Consultants:  none  Objective: Blood pressure (!) 143/82, pulse 70, temperature 97.6 F (36.4 C), temperature source Oral, resp. rate 17, height 5\' 10"  (1.778 m), weight 66.8 kg, SpO2 97 %.  Intake/Output Summary (Last 24 hours) at 02/01/2020 1050 Last data filed at 02/01/2020 1019 Gross per 24 hour  Intake 360 ml  Output 1000 ml  Net -640 ml   Filed Weights   01/03/20 1414  Weight: 66.8 kg    Examination: General: No acute respiratory distress Lungs: Clear to auscultation bilaterally  Cardiovascular: Regular rate and rhythm  Extremities: No significant edema bilateral lower extremities  CBC: Recent Labs  Lab 01/26/20 0403  WBC 10.4  HGB 10.9*  HCT 33.6*  MCV 81.4  PLT [id number]   Basic Metabolic Panel: Recent Labs  Lab 01/26/20 0403 01/30/20 0636  NA 136  --   K 4.6  --   CL 104  --   CO2 23  --   GLUCOSE 83  --   BUN 18  --   CREATININE 1.00 1.08  CALCIUM 8.4*  --    GFR: Estimated Creatinine Clearance: 53.3 mL/min (by C-G formula based on SCr of 1.08 mg/dL).  Liver Function Tests: Recent Labs  Lab 01/26/20 0403  AST 23  ALT 12  ALKPHOS 52  BILITOT 0.6  PROT 6.5  ALBUMIN 2.1*   HbA1C: Hemoglobin A1C  Date/Time Value Ref Range Status  09/26/2017 11:12 AM 5.4 4.0 - 5.6 % Final  05/16/2016 11:41 AM 5.7  Final  01/04/2012 05:44 AM 5.3  4.2 - 6.3 % Final    Comment:    The American Diabetes Association recommends that a primary goal of therapy should be <7% and that physicians should reevaluate the treatment regimen in patients with HbA1c values consistently >8%.    Hgb A1c MFr Bld  Date/Time Value Ref Range Status  11/04/2019 05:00 AM 5.7 (H) 4.8 - 5.6 % Final    Comment:    (NOTE) Pre diabetes:          5.7%-6.4%  Diabetes:              >6.4%  Glycemic control for   <7.0% adults with diabetes     Scheduled  Meds: . aspirin EC  81 mg Oral Daily  . clopidogrel  75 mg Oral Daily  . DULoxetine  60 mg Oral Daily  . enoxaparin (LOVENOX) injection  40 mg Subcutaneous Daily  . feeding supplement  237 mL Oral TID BM  . metroNIDAZOLE  500 mg Oral Q8H  . multivitamin with minerals  1 tablet Oral Daily  . rosuvastatin  10 mg Oral Daily   Continuous Infusions: . cefTRIAXone (ROCEPHIN)  IV 2 g (02/01/20 0910)     LOS: 30 days   Lonia Blood, MD [facility] Office  [phone] Pager - Text Page per Amion  If 7PM-7AM, please contact night-coverage per Amion 02/01/2020, 10:50 AM

## 2020-02-02 DIAGNOSIS — J869 Pyothorax without fistula: Secondary | ICD-10-CM | POA: Diagnosis not present

## 2020-02-02 DIAGNOSIS — J90 Pleural effusion, not elsewhere classified: Secondary | ICD-10-CM | POA: Diagnosis not present

## 2020-02-02 NOTE — Progress Notes (Signed)
[name] [name]  [id number] DOB: 10-04-1940 DOA: 01/02/2020 PCP: [name], [name], MD    Brief Narrative:  80 year old with a history of CVA October 2021 with residual dysarthria, HTN, HLD, and spinal stenosis who presented to [facility] with confusion and difficulty breathing.  Work-up revealed a right loculated pleural effusion/empyema.  IR at [facility] attempted US guided chest tube placement but were unsuccessful due to the thickness of the pleural fluid.  General surgery at [facility] evaluated the patient and ultimately he was transferred to [facility] for TCTS evaluation.  Chest tube was placed by IR at [facility] 12/3.  ID was also consulted and directed antibiotic therapy.  On 12/16 the patient's chest tube was able to be removed.  TCTS was able to sign off 12/17 and advised no further follow-up was needed.  Patient has remained stable for discharge for quite some time but reportedly needs long-term Medicaid in place before he can be placed in a SNF  Antimicrobials:  Zosyn 12/2 > Rocephin Flagyl  DVT prophylaxis: Lovenox  Subjective: Resting comfortably in bed.  Has no new complaints.  Is in good spirits.  We continue to await a SNF bed.  Assessment & Plan:  Right parapneumonic effusion/empyema -acute hypoxic respiratory failure Hypoxia resolved -pigtail chest tube placed 12/3 and subsequently removed 12/16 -follow-up CXR 12/17 without pneumothorax  Physical deconditioning PT and OT working with the patient and recommend SNF for safe discharge  HTN Blood pressure controlled  Severe protein calorie malnutrition in context of chronic illness  Acute kidney injury Resolved  History of Lacunar CVA Oct 2021 Continue Plavix and low-dose aspirin -continue Crestor -residual dysarthria  Dementia without behavioral disturbances  Anemia of chronic disease Baseline hemoglobin approximately 10   Code Status: NO CODE BLUE Family Communication:  Status is: Inpatient  Remains inpatient  appropriate because:Unsafe d/c plan   Dispo:  Patient From:    Planned Disposition:    Expected discharge date: 02/04/2020  Medically stable for discharge:     Consultants:  none  Objective: Blood pressure 132/73, pulse 76, temperature 97.7 F (36.5 C), temperature source Oral, resp. rate 16, height 5\' 10"  (1.778 m), weight 66.8 kg, SpO2 98 %.  Intake/Output Summary (Last 24 hours) at 02/02/2020 1119 Last data filed at 02/02/2020 1015 Gross per 24 hour  Intake 520 ml  Output 1302 ml  Net -782 ml   Filed Weights   01/03/20 1414  Weight: 66.8 kg    Examination: General: No acute respiratory distress Lungs: Clear to auscultation B Cardiovascular: Regular rate and rhythm  Extremities: No significant edema bilateral lower extremities  CBC: No results for input(s): WBC, NEUTROABS, HGB, HCT, MCV, PLT in the last 168 hours. Basic Metabolic Panel: Recent Labs  Lab 01/30/20 0636  CREATININE 1.08   GFR: Estimated Creatinine Clearance: 53.3 mL/min (by C-G formula based on SCr of 1.08 mg/dL).  Liver Function Tests: No results for input(s): AST, ALT, ALKPHOS, BILITOT, PROT, ALBUMIN in the last 168 hours. HbA1C: Hemoglobin A1C  Date/Time Value Ref Range Status  09/26/2017 11:12 AM 5.4 4.0 - 5.6 % Final  05/16/2016 11:41 AM 5.7  Final  01/04/2012 05:44 AM 5.3 4.2 - 6.3 % Final    Comment:    The American Diabetes Association recommends that a primary goal of therapy should be <7% and that physicians should reevaluate the treatment regimen in patients with HbA1c values consistently >8%.    Hgb A1c MFr Bld  Date/Time Value Ref Range Status  11/04/2019 05:00 AM  5.7 (H) 4.8 - 5.6 % Final    Comment:    (NOTE) Pre diabetes:          5.7%-6.4%  Diabetes:              >6.4%  Glycemic control for   <7.0% adults with diabetes     Scheduled Meds: . aspirin EC  81 mg Oral Daily  . clopidogrel  75 mg Oral Daily  . DULoxetine  60 mg Oral Daily  . enoxaparin (LOVENOX)  injection  40 mg Subcutaneous Daily  . feeding supplement  237 mL Oral TID BM  . metroNIDAZOLE  500 mg Oral Q8H  . multivitamin with minerals  1 tablet Oral Daily  . rosuvastatin  10 mg Oral Daily   Continuous Infusions: . cefTRIAXone (ROCEPHIN)  IV 2 g (02/02/20 0848)     LOS: 31 days   Lonia Blood, MD [facility] Office  [phone] Pager - Text Page per Amion  If 7PM-7AM, please contact night-coverage per Amion 02/02/2020, 11:19 AM

## 2020-02-02 NOTE — Plan of Care (Signed)
  Problem: Education: Goal: Knowledge of General Education information will improve Description: Including pain rating scale, medication(s)/side effects and non-pharmacologic comfort measures Outcome: Progressing   Problem: Clinical Measurements: Goal: Ability to maintain clinical measurements within normal limits will improve Outcome: Progressing Goal: Will remain free from infection Outcome: Progressing Goal: Diagnostic test results will improve Outcome: Progressing Goal: Respiratory complications will improve Outcome: Progressing Goal: Cardiovascular complication will be avoided Outcome: Progressing   Problem: Pain Managment: Goal: General experience of comfort will improve Outcome: Progressing   Problem: Skin Integrity: Goal: Risk for impaired skin integrity will decrease Outcome: Progressing   Problem: Fluid Volume: Goal: Hemodynamic stability will improve Outcome: Progressing   Problem: Clinical Measurements: Goal: Diagnostic test results will improve Outcome: Progressing Goal: Signs and symptoms of infection will decrease Outcome: Progressing   Problem: Respiratory: Goal: Ability to maintain adequate ventilation will improve Outcome: Progressing   Problem: Education: Goal: Knowledge of General Education information will improve Description: Including pain rating scale, medication(s)/side effects and non-pharmacologic comfort measures Outcome: Progressing   Problem: Health Behavior/Discharge Planning: Goal: Ability to manage health-related needs will improve Outcome: Progressing   Problem: Clinical Measurements: Goal: Ability to maintain clinical measurements within normal limits will improve Outcome: Progressing Goal: Will remain free from infection Outcome: Progressing Goal: Diagnostic test results will improve Outcome: Progressing Goal: Respiratory complications will improve Outcome: Progressing Goal: Cardiovascular complication will be  avoided Outcome: Progressing   Problem: Activity: Goal: Risk for activity intolerance will decrease Outcome: Progressing   Problem: Nutrition: Goal: Adequate nutrition will be maintained Outcome: Progressing   Problem: Coping: Goal: Level of anxiety will decrease Outcome: Progressing   Problem: Elimination: Goal: Will not experience complications related to bowel motility Outcome: Progressing Goal: Will not experience complications related to urinary retention Outcome: Progressing   Problem: Pain Managment: Goal: General experience of comfort will improve Outcome: Progressing   Problem: Safety: Goal: Ability to remain free from injury will improve Outcome: Progressing   Problem: Skin Integrity: Goal: Risk for impaired skin integrity will decrease Outcome: Progressing   

## 2020-02-03 DIAGNOSIS — Z8673 Personal history of transient ischemic attack (TIA), and cerebral infarction without residual deficits: Secondary | ICD-10-CM | POA: Diagnosis not present

## 2020-02-03 DIAGNOSIS — R5381 Other malaise: Secondary | ICD-10-CM | POA: Diagnosis not present

## 2020-02-03 DIAGNOSIS — F0280 Dementia in other diseases classified elsewhere without behavioral disturbance: Secondary | ICD-10-CM | POA: Diagnosis not present

## 2020-02-03 DIAGNOSIS — J869 Pyothorax without fistula: Secondary | ICD-10-CM | POA: Diagnosis not present

## 2020-02-03 NOTE — Progress Notes (Signed)
[facility] PROGRESS NOTE  [name] [name] [id number] DOB: 1940-02-21 DOA: 01/02/2020 PCP: [name], [name], MD  Status: Remains inpatient appropriate because:Unsafe d/c plan   Dispo:  Patient From: HomePrivate residence  Planned Disposition: Skilled Nursing FacilitySNF  Expected discharge date: 02/05/2020  Medically stable for discharge: [name]; barriers to discharge: Needs SNF placement but Medicaid application in process and is pending.  Patient's son [name] will be assisting with this process; patient sister has also been assisting -CSW attempted to contact son on 1/03 with no response.   Code Status: DNR Family Communication: Son [name] 12/28 DVT prophylaxis: Lovenox Vaccination status: Covid vaccination status unknown, has received influenza vaccine as well as pneumococcal vaccine in October 2021  Foley catheter: No   HPI: 80 year old male with history of recent CVA in 11/2019 with residual dysarthria, hypertension, hyperlipidemia and spinal stenosis, initially presented to [facility] from [facility] with confusion and difficulty breathing. Work-up revealed right-sided loculated pleural effusion with concern for empyema. IR at [facility] attempted ultrasound-guided chest tube placement but were unable due to thickness of pleural fluid. He was evaluated by general surgery at [facility] who recommended transfer to [facility] for thoracic surgery evaluation. Patient was evaluated by thoracic surgery who recommended chest tube placement by IR, that was performed on 01/03/2020. ID consulted and recommended transitioning from IV Zosyn to IV ceftriaxone and oral Flagyl while in-house and discharge on p.o. Augmentin for a total of 6 weeks with start date of 01/03/2020. Right-sided chest tube removed 12/16. TCTS signed off 12/17 and advised no outpatient follow-up needed.  Patient stable for discharge.  PT/OT recommending SNF.  Patient will need long-term Medicaid in place before  can discharge.  Subjective: Awakened from sleep.  No specific complaints verbalized.  Interactive when spoken to.  Objective: Vitals:   02/02/20 1941 02/03/20 0412  BP: 110/67 109/68  Pulse: 89 90  Resp: 17 17  Temp: 98.4 F (36.9 C) 98.5 F (36.9 C)  SpO2: 97% 98%    Intake/Output Summary (Last 24 hours) at 02/03/2020 1343 Last data filed at 02/03/2020 [id number] Gross per 24 hour  Intake 120 ml  Output 850 ml  Net -730 ml   Filed Weights   01/03/20 1414  Weight: 66.8 kg    Exam: Constitutional: Awakened, calm and no acute distress Respiratory: Stable on room air, bilateral lung sounds clear to auscultation on anterior exam Cardiovascular: Heart sounds normal S1-S2, no JVD, no peripheral edema, pulse regular nontachycardic at rest Abdomen: Bowel sounds present, abdomen soft nontender nondistended normoactive bowel sounds.  LBM 1/02 Neurologic: CN 2-12 grossly intact. Sensation intact,  Strength 5/5 x on left slightly weaker 4/5 on the right Psychiatric: alert and oriented x name only.  Not to situation either.  Flat affect but will occasionally smile.   Assessment/Plan: Acute problems: Acute hypoxemic respiratory failure  2/2 right-sided parapneumonic effusion, empyema  -Hypoxemia (resolved)-TCTS consulted -s/p pigtail chest tube on 01/03/2020 and was removed on 12/16. -Follow-up chest x-ray 12/17 without pneumothorax.   -Thoracic surgery signed off 12/17, prn OP follow-up recommend  -Continue antibiotics (currently on ceftriaxone and metronidazole initially was treated with IV Zosyn beginning on 12/2) for a total of 4 weeks with expected last dose 02/02/2020 (can transition to Augmentin to complete treatment if loses IV site)  Physical deconditioning -PT and OT recommending SNF. -Evaluated today with documentation that he is very motivated but did asked to go back to bed after walking.  Encourage more frequent out of bed so he will  build endurance.  Patient finally encouraged to  stay up in chair.  Hypertension -Controlled off antihypertensive medication  Severe protein calorie malnutrition Nutrition Problem: Severe Malnutrition Etiology: chronic illness (CVA) Signs/Symptoms: moderate fat depletion,severe fat depletion,moderate muscle depletion,severe muscle depletion Interventions: Ensure Enlive (each supplement provides 350kcal and 20 grams of protein),Magic cup,MVI -As of 12/23 patient able to feed self and eating meals well (between 50% and 100% of meals over the past 24 hours) Estimated body mass index is 21.13 kg/m as calculated from the following:   Height as of this encounter: 5\' 10"  (1.778 m).   Weight as of this encounter: 66.8 kg.   Wounds: Incision (Closed) 01/03/20 (Active)  Date First Assessed/Time First Assessed: 01/03/20 2000      Assessments 01/03/2020  8:00 PM 02/03/2020  9:00 AM  Dressing Type - Gauze (Comment)  Dressing Dry;Clean;Intact -  Site / Wound Assessment Dressing in place / Unable to assess Clean;Dry  Drainage Amount - None  Drainage Description Serosanguineous -     No Linked orders to display     Other problems: Acute kidney injury -Resolved -Current creatinine 1.01 with a GFR greater than 60  History of CVA/dementia without behavioral disturbances -Remains normotensive off of BP medication -Continue Plavix and low-dose aspirin -Continue statin/Crestor -Continue Cymbalta  Anemia of chronic disease -Hemoglobin stable around 10   Data Reviewed: Basic Metabolic Panel: Recent Labs  Lab 01/30/20 0636  CREATININE 1.08   Liver Function Tests: No results for input(s): AST, ALT, ALKPHOS, BILITOT, PROT, ALBUMIN in the last 168 hours. No results for input(s): LIPASE, AMYLASE in the last 168 hours. No results for input(s): AMMONIA in the last 168 hours. CBC: No results for input(s): WBC, NEUTROABS, HGB, HCT, MCV, PLT in the last 168 hours. Cardiac Enzymes: No results for input(s): CKTOTAL, CKMB, CKMBINDEX, TROPONINI  in the last 168 hours. BNP (last 3 results) Recent Labs    12/26/19 1510 01/01/20 1018  BNP 88.7 177.1*    ProBNP (last 3 results) No results for input(s): PROBNP in the last 8760 hours.  CBG: No results for input(s): GLUCAP in the last 168 hours.  No results found for this or any previous visit (from the past 240 hour(s)).   Studies: No results found.  Scheduled Meds: . aspirin EC  81 mg Oral Daily  . clopidogrel  75 mg Oral Daily  . DULoxetine  60 mg Oral Daily  . enoxaparin (LOVENOX) injection  40 mg Subcutaneous Daily  . feeding supplement  237 mL Oral TID BM  . metroNIDAZOLE  500 mg Oral Q8H  . multivitamin with minerals  1 tablet Oral Daily  . rosuvastatin  10 mg Oral Daily   Continuous Infusions: . cefTRIAXone (ROCEPHIN)  IV 2 g (02/03/20 0801)    Active Problems:   Empyema of lung (HCC)   Severe sepsis without septic shock ([place])   History of CVA in adulthood   Physical deconditioning   Consultants:  TCTS  Conventional radiology  Procedures:  Chest tube placement 12/3 by IR  Antibiotics: Anti-infectives (From admission, onward)   Start     Dose/Rate Route Frequency Ordered Stop   01/12/20 1400  metroNIDAZOLE (FLAGYL) tablet 500 mg        500 mg Oral Every 8 hours 01/12/20 1111     01/12/20 1300  cefTRIAXone (ROCEPHIN) 2 g in sodium chloride 0.9 % 100 mL IVPB        2 g 200 mL/hr over 30 Minutes Intravenous Every 24 hours 01/12/20  1111     01/02/20 2330  piperacillin-tazobactam (ZOSYN) IVPB 3.375 g  Status:  Discontinued        3.375 g 12.5 mL/hr over 240 Minutes Intravenous Every 8 hours 01/02/20 2242 01/12/20 1111       Time spent: 20 minutes    Junious Silk ANP  [facility] 7 am- 330 pm/M-F 32 days

## 2020-02-03 NOTE — Plan of Care (Signed)
  Problem: Education: Goal: Knowledge of General Education information will improve Description: Including pain rating scale, medication(s)/side effects and non-pharmacologic comfort measures Outcome: Progressing   Problem: Clinical Measurements: Goal: Ability to maintain clinical measurements within normal limits will improve Outcome: Progressing Goal: Will remain free from infection Outcome: Progressing Goal: Diagnostic test results will improve Outcome: Progressing Goal: Respiratory complications will improve Outcome: Progressing Goal: Cardiovascular complication will be avoided Outcome: Progressing   Problem: Pain Managment: Goal: General experience of comfort will improve Outcome: Progressing   Problem: Skin Integrity: Goal: Risk for impaired skin integrity will decrease Outcome: Progressing   Problem: Fluid Volume: Goal: Hemodynamic stability will improve Outcome: Progressing   Problem: Clinical Measurements: Goal: Diagnostic test results will improve Outcome: Progressing Goal: Signs and symptoms of infection will decrease Outcome: Progressing   Problem: Respiratory: Goal: Ability to maintain adequate ventilation will improve Outcome: Progressing   Problem: Education: Goal: Knowledge of General Education information will improve Description: Including pain rating scale, medication(s)/side effects and non-pharmacologic comfort measures Outcome: Progressing   Problem: Health Behavior/Discharge Planning: Goal: Ability to manage health-related needs will improve Outcome: Progressing   Problem: Clinical Measurements: Goal: Ability to maintain clinical measurements within normal limits will improve Outcome: Progressing Goal: Will remain free from infection Outcome: Progressing Goal: Diagnostic test results will improve Outcome: Progressing Goal: Respiratory complications will improve Outcome: Progressing Goal: Cardiovascular complication will be  avoided Outcome: Progressing   Problem: Activity: Goal: Risk for activity intolerance will decrease Outcome: Progressing   Problem: Nutrition: Goal: Adequate nutrition will be maintained Outcome: Progressing   Problem: Coping: Goal: Level of anxiety will decrease Outcome: Progressing   Problem: Elimination: Goal: Will not experience complications related to bowel motility Outcome: Progressing Goal: Will not experience complications related to urinary retention Outcome: Progressing   Problem: Pain Managment: Goal: General experience of comfort will improve Outcome: Progressing   Problem: Safety: Goal: Ability to remain free from injury will improve Outcome: Progressing   Problem: Skin Integrity: Goal: Risk for impaired skin integrity will decrease Outcome: Progressing   

## 2020-02-03 NOTE — Evaluation (Signed)
Physical Therapy Evaluation Patient Details Name: [name] [name] MRN: [id number] DOB: 09-05-1940 Today's Date: 02/03/2020   History of Present Illness  Pt is a 80 y.o. male (from SNF) admitted 01/02/20 as transfer from [facility] (admitted there 12/26/19) for CTS evaluation for possible VATS decortication. Workup revealed R-side loculated pleural efffusion with concern for empyema, possible PNA. PMH includes lumbar stenosis, renal disorder.  Clinical Impression  Pt was seen for mobility on RW with gait x 2 and cues for safety of transfers, posture and awareness of his abilities to assist with the tasks.  He is fairly motivated but did ask to go back to bed after walks.  Encouraged OOB so he will build endurance and talked with pt about his plan to try and get home.  Pt was finally able to stay up inchair and chair alarm was active.  Follow up for goals of acute PT.    Follow Up Recommendations SNF    Equipment Recommendations  Rolling walker with 5" wheels;3in1 (PT)    Recommendations for Other Services       Precautions / Restrictions Precautions Precautions: Fall Precaution Comments: bladder and bowel incontinence Restrictions Weight Bearing Restrictions: No      Mobility  Bed Mobility Overal bed mobility: Needs Assistance Bed Mobility: Supine to Sit Rolling: Min assist   Supine to sit: Min assist     General bed mobility comments: up to side of bed but asking to go back once in the chair    Transfers Overall transfer level: Needs assistance Equipment used: Rolling walker (2 wheeled) Transfers: Sit to/from Stand Sit to Stand: Mod assist Stand pivot transfers: Mod assist       General transfer comment: standing from bed and recliner with cues for every transition on hand placement  Ambulation/Gait Ambulation/Gait assistance: Min assist Gait Distance (Feet): 10 Feet (6+4) Assistive device: Rolling walker (2 wheeled) Gait Pattern/deviations: Step-to  pattern;Step-through pattern;Wide base of support;Drifts right/left Gait velocity: reduced   General Gait Details: requires help and cues for avoiding listing to L and to maintain safe sequencing with walker  Stairs            Wheelchair Mobility    Modified Rankin (Stroke Patients Only)       Balance Overall balance assessment: Needs assistance Sitting-balance support: Feet supported Sitting balance-Leahy Scale: Fair Sitting balance - Comments: posterior lean especially without UE support Postural control: Posterior lean Standing balance support: Bilateral upper extremity supported;During functional activity Standing balance-Leahy Scale: Poor                               Pertinent Vitals/Pain Pain Assessment: No/denies pain    Home Living                        Prior Function                 Hand Dominance        Extremity/Trunk Assessment                Communication      Cognition Arousal/Alertness: Awake/alert Behavior During Therapy: Flat affect Overall Cognitive Status: History of cognitive impairments - at baseline Area of Impairment: Problem solving;Safety/judgement;Awareness;Following commands;Attention                   Current Attention Level: Selective Memory: Decreased recall of precautions;Decreased short-term memory Following Commands: Follows one step commands  inconsistently;Follows one step commands with increased time Safety/Judgement: Decreased awareness of safety;Decreased awareness of deficits Awareness: Intellectual Problem Solving: Slow processing;Requires verbal cues;Requires tactile cues General Comments: slow response to cues for hand placeemtn to stand or sit, slow to maneuver walker away from obstacles      General Comments General comments (skin integrity, edema, etc.): pt is up to stand and then to chair, and walked again with encouragement.  Asking to go back to bedimmediately and  again encouraged to stay up.  Repositioned on the chair for better compliance with being up    Exercises     Assessment/Plan    PT Assessment    PT Problem List         PT Treatment Interventions      PT Goals (Current goals can be found in the Care Plan section)  Acute Rehab PT Goals Patient Stated Goal: none stated    Frequency Min 2X/week   Barriers to discharge        Co-evaluation               AM-PAC PT "6 Clicks" Mobility  Outcome Measure Help needed turning from your back to your side while in a flat bed without using bedrails?: A Little Help needed moving from lying on your back to sitting on the side of a flat bed without using bedrails?: A Little Help needed moving to and from a bed to a chair (including a wheelchair)?: A Lot Help needed standing up from a chair using your arms (e.g., wheelchair or bedside chair)?: A Lot Help needed to walk in hospital room?: A Lot Help needed climbing 3-5 steps with a railing? : Total 6 Click Score: 13    End of Session Equipment Utilized During Treatment: Gait belt Activity Tolerance: Patient tolerated treatment well Patient left: in chair;with call bell/phone within reach;with chair alarm set Nurse Communication: Mobility status PT Visit Diagnosis: Difficulty in walking, not elsewhere classified (R26.2);Muscle weakness (generalized) (M62.81)    Time: 1610-9604 PT Time Calculation (min) (ACUTE ONLY): 20 min   Charges:     PT Treatments $Gait Training: 8-22 mins       Ivar Drape 02/03/2020, 1:25 PM  Samul Dada, PT MS Acute Rehab Dept. Number: [facility] [id number] and [facility] [phone]

## 2020-02-03 NOTE — Progress Notes (Signed)
Occupational Therapy Treatment Patient Details Name: [name] [name] MRN: [id number] DOB: [birth year] Today's Date: 02/03/2020    History of present illness Pt is a 80 y.o. male (from SNF) admitted 01/02/20 as transfer from [facility] (admitted there 12/26/19) for CTS evaluation for possible VATS decortication. Workup revealed R-side loculated pleural efffusion with concern for empyema, possible PNA. PMH includes lumbar stenosis, renal disorder.   OT comments  Pt making progress with functional goals. Upon arrival, pt seated in recliner and slumped down chair. Pt stood at RW mod A for SPT with RW to [facility] mod A. Pt required total assist for peri hygiene. Pt transferred back to recliner mod A, donned clean gown min A. OT will continue to follow acutely to maximize level of function and safety  Follow Up Recommendations  SNF;Supervision/Assistance - 24 hour    Equipment Recommendations  None recommended by OT (TBD at next venue of care)    Recommendations for Other Services      Precautions / Restrictions Precautions Precautions: Fall Precaution Comments: bladder and bowel incontinence Restrictions Weight Bearing Restrictions: No       Mobility Bed Mobility Overal bed mobility:           General bed mobility comments: pt in recliner upon arrivlal  Transfers Overall transfer level: Needs assistance Equipment used: Rolling walker (2 wheeled) Transfers: Sit to/from Stand Sit to Stand: Mod assist Stand pivot transfers: Mod assist       General transfer comment: recliner to BSC back to recliner, cues for hand placement    Balance Overall balance assessment: Needs assistance Sitting-balance support: Feet supported Sitting balance-Leahy Scale: Fair Sitting balance - Comments: posterior lean especially without UE support Postural control: Posterior lean Standing balance support: Bilateral upper extremity supported;During functional activity Standing balance-Leahy Scale:  Poor                             ADL either performed or assessed with clinical judgement   ADL   Eating/Feeding: Set up;Sitting Eating/Feeding Details (indicate cue type and reason): seated in recliner Grooming: Wash/dry face;Sitting;Wash/dry hands;Min guard Grooming Details (indicate cue type and reason): in recliner (chair alarm on)         Upper Body Dressing : Minimal assistance;Sitting Upper Body Dressing Details (indicate cue type and reason): donned clean gown seated in recliner     Toilet Transfer: Stand-pivot;RW;Moderate assistance   Toileting- Clothing Manipulation and Hygiene: Total assistance;Sit to/from stand Toileting - Clothing Manipulation Details (indicate cue type and reason): total A for posterior pericare after incontinent BM     Functional mobility during ADLs: Rolling walker;Moderate assistance       Vision Patient Visual Report: No change from baseline     Perception     Praxis      Cognition Arousal/Alertness: Awake/alert Behavior During Therapy: Flat affect Overall Cognitive Status: History of cognitive impairments - at baseline Area of Impairment: Problem solving;Safety/judgement;Awareness;Following commands;Attention                   Current Attention Level: Selective Memory: Decreased recall of precautions;Decreased short-term memory Following Commands: Follows one step commands inconsistently;Follows one step commands with increased time Safety/Judgement: Decreased awareness of safety;Decreased awareness of deficits Awareness: Intellectual Problem Solving: Slow processing;Requires verbal cues;Requires tactile cues General Comments: slow response to cues for hand placement for stand or sit transition        Exercises     Shoulder Instructions  General Comments     Pertinent Vitals/ Pain       Pain Assessment: No/denies pain Pain Score: 0-No pain Pain Intervention(s): Monitored during session  Home  Living                                          Prior Functioning/Environment              Frequency  Min 2X/week        Progress Toward Goals  OT Goals(current goals can now be found in the care plan section)  Progress towards OT goals: Progressing toward goals  Acute Rehab OT Goals Patient Stated Goal: none stated  Plan Discharge plan remains appropriate    Co-evaluation                 AM-PAC OT "6 Clicks" Daily Activity     Outcome Measure   Help from another person eating meals?: None Help from another person taking care of personal grooming?: A Little Help from another person toileting, which includes using toliet, bedpan, or urinal?: A Lot Help from another person bathing (including washing, rinsing, drying)?: A Lot Help from another person to put on and taking off regular upper body clothing?: A Little Help from another person to put on and taking off regular lower body clothing?: Total 6 Click Score: 15    End of Session Equipment Utilized During Treatment: Gait belt;Rolling walker;Other (comment) (BSC)  OT Visit Diagnosis: Unsteadiness on feet (R26.81);Other abnormalities of gait and mobility (R26.89);Muscle weakness (generalized) (M62.81);Other symptoms and signs involving cognitive function   Activity Tolerance Patient tolerated treatment well   Patient Left in chair;with call bell/phone within reach;with chair alarm set   Nurse Communication          Time: [phone] OT Time Calculation (min): 20 min  Charges: OT General Charges $OT Visit: 1 Visit OT Treatments $Self Care/Home Management : 8-22 mins     Britt Bottom 02/03/2020, 3:28 PM

## 2020-02-03 NOTE — Progress Notes (Signed)
CSW attempted to reach patient's son Janyth Pupa to discuss Medicaid application - no answer, a voicemail was left requesting a return call.  Edwin Dada, MSW, LCSW-A Transitions of Care  Clinical Social Worker I [phone]

## 2020-02-04 DIAGNOSIS — Z8673 Personal history of transient ischemic attack (TIA), and cerebral infarction without residual deficits: Secondary | ICD-10-CM | POA: Diagnosis not present

## 2020-02-04 DIAGNOSIS — F0280 Dementia in other diseases classified elsewhere without behavioral disturbance: Secondary | ICD-10-CM | POA: Diagnosis not present

## 2020-02-04 DIAGNOSIS — J869 Pyothorax without fistula: Secondary | ICD-10-CM | POA: Diagnosis not present

## 2020-02-04 DIAGNOSIS — R5381 Other malaise: Secondary | ICD-10-CM | POA: Diagnosis not present

## 2020-02-04 NOTE — Progress Notes (Signed)
[facility] PROGRESS NOTE  Uzoma Vivona EPP:[id number] DOB: 1940-06-29 DOA: 01/02/2020 PCP: [place], [name], MD  Status: Remains inpatient appropriate because:Unsafe d/c plan   Dispo:  Patient From: HomePrivate residence  Planned Disposition: Skilled Nursing FacilitySNF  Expected discharge date: 02/07/2020  Medically stable for discharge: [place]; barriers to discharge: Needs SNF placement but Medicaid application in process and is pending.  Patient's son Janyth Pupa will be assisting with this process; patient sister has also been assisting -CSW attempted to contact son on 1/03 with no response.   Code Status: DNR Family Communication: Son Janyth Pupa 12/28 DVT prophylaxis: Lovenox Vaccination status: Covid vaccination status unknown, has received influenza vaccine as well as pneumococcal vaccine in October 2021  Foley catheter: No   HPI: 80 year old male with history of recent CVA in 11/2019 with residual dysarthria, hypertension, hyperlipidemia and spinal stenosis, initially presented to [facility] from [facility] SNF with confusion and difficulty breathing. Work-up revealed right-sided loculated pleural effusion with concern for empyema. IR at [facility] attempted ultrasound-guided chest tube placement but were unable due to thickness of pleural fluid. He was evaluated by general surgery at [facility] who recommended transfer to [facility] for thoracic surgery evaluation. Patient was evaluated by thoracic surgery who recommended chest tube placement by IR, that was performed on 01/03/2020. ID consulted and recommended transitioning from IV Zosyn to IV ceftriaxone and oral Flagyl while in-house and discharge on p.o. Augmentin for a total of 6 weeks with start date of 01/03/2020. Right-sided chest tube removed 12/16. TCTS signed off 12/17 and advised no outpatient follow-up needed.  Patient stable for discharge.  PT/OT recommending SNF.  Patient will need long-term Medicaid in place before  can discharge.  Subjective: Reports is sleepy today and not ready to get out of bed.  Also wants to wait before breakfast tray set up.  Objective: Vitals:   02/03/20 2051 02/04/20 0508  BP: 122/83 130/79  Pulse: 85 67  Resp: 17 18  Temp: 98.2 F (36.8 C) 98.1 F (36.7 C)  SpO2: 98% 100%    Intake/Output Summary (Last 24 hours) at 02/04/2020 1317 Last data filed at 02/04/2020 0901 Gross per 24 hour  Intake 120 ml  Output 1075 ml  Net -955 ml   Filed Weights   01/03/20 1414  Weight: 66.8 kg    Exam: Constitutional: Awakened, no acute distress Respiratory: Normal lung sounds, stable on room air Cardiovascular: S2, no murmurs, no peripheral edema, pulses regular Abdomen: Bowel sounds present, abdomen soft nontender nondistended normoactive bowel sounds.  LBM 1/04 Neurologic: CN 2-12 grossly intact. Sensation intact,  Strength 5/5 x on left slightly weaker 4/5 on the right Psychiatric: alert and oriented x name only. Flat affect, occasionally smiles.   Assessment/Plan: Acute problems: Acute hypoxemic respiratory failure  2/2 right-sided parapneumonic effusion, empyema  -Hypoxemia (resolved)-TCTS consulted -s/p pigtail chest tube on 01/03/2020 and was removed on 12/16. -Follow-up chest x-ray 12/17 without pneumothorax.   -Thoracic surgery signed off 12/17, prn OP follow-up recommend  -Continue antibiotics (currently on ceftriaxone and metronidazole initially was treated with IV Zosyn beginning on 12/2) for a total of 4 weeks with expected last dose 02/02/2020 (can transition to Augmentin to complete treatment if loses IV site)  Physical deconditioning -PT and OT recommending SNF. -Evaluated today with documentation that he is very motivated but did asked to go back to bed after walking.  Encourage more frequent out of bed so he will build endurance.  Patient finally encouraged to stay up in chair.  Hypertension -  Controlled off antihypertensive medication  Severe protein calorie  malnutrition Nutrition Problem: Severe Malnutrition Etiology: chronic illness (CVA) Signs/Symptoms: moderate fat depletion,severe fat depletion,moderate muscle depletion,severe muscle depletion Interventions: Ensure Enlive (each supplement provides 350kcal and 20 grams of protein),Magic cup,MVI -As of 12/23 patient able to feed self and eating meals well (between 50% and 100% of meals over the past 24 hours) Estimated body mass index is 21.13 kg/m as calculated from the following:   Height as of this encounter: 5\' 10"  (1.778 m).   Weight as of this encounter: 66.8 kg.   Wounds: Incision (Closed) 01/03/20 (Active)  Date First Assessed/Time First Assessed: 01/03/20 2000      Assessments 01/03/2020  8:00 PM 02/04/2020  9:00 AM  Dressing Type - None  Dressing Dry;Clean;Intact -  Site / Wound Assessment Dressing in place / Unable to assess Clean;Dry  Drainage Description Serosanguineous -     No Linked orders to display     Other problems: Acute kidney injury -Resolved -Current creatinine 1.01 with a GFR greater than 60  History of CVA/dementia without behavioral disturbances -Remains normotensive off of BP medication -Continue Plavix and low-dose aspirin -Continue statin/Crestor -Continue Cymbalta  Anemia of chronic disease -Hemoglobin stable around 10   Data Reviewed: Basic Metabolic Panel: Recent Labs  Lab 01/30/20 0636  CREATININE 1.08   Liver Function Tests: No results for input(s): AST, ALT, ALKPHOS, BILITOT, PROT, ALBUMIN in the last 168 hours. No results for input(s): LIPASE, AMYLASE in the last 168 hours. No results for input(s): AMMONIA in the last 168 hours. CBC: No results for input(s): WBC, NEUTROABS, HGB, HCT, MCV, PLT in the last 168 hours. Cardiac Enzymes: No results for input(s): CKTOTAL, CKMB, CKMBINDEX, TROPONINI in the last 168 hours. BNP (last 3 results) Recent Labs    12/26/19 1510 01/01/20 1018  BNP 88.7 177.1*    ProBNP (last 3  results) No results for input(s): PROBNP in the last 8760 hours.  CBG: No results for input(s): GLUCAP in the last 168 hours.  No results found for this or any previous visit (from the past 240 hour(s)).   Studies: No results found.  Scheduled Meds: . aspirin EC  81 mg Oral Daily  . clopidogrel  75 mg Oral Daily  . DULoxetine  60 mg Oral Daily  . enoxaparin (LOVENOX) injection  40 mg Subcutaneous Daily  . feeding supplement  237 mL Oral TID BM  . metroNIDAZOLE  500 mg Oral Q8H  . multivitamin with minerals  1 tablet Oral Daily  . rosuvastatin  10 mg Oral Daily   Continuous Infusions: . cefTRIAXone (ROCEPHIN)  IV 2 g (02/04/20 0936)    Active Problems:   Empyema of lung ([place])   Severe sepsis without septic shock ([place])   History of CVA in adulthood   Physical deconditioning   Consultants:  TCTS  Conventional radiology  Procedures:  Chest tube placement 12/3 by IR  Antibiotics: Anti-infectives (From admission, onward)   Start     Dose/Rate Route Frequency Ordered Stop   01/12/20 1400  metroNIDAZOLE (FLAGYL) tablet 500 mg        500 mg Oral Every 8 hours 01/12/20 1111     01/12/20 1300  cefTRIAXone (ROCEPHIN) 2 g in sodium chloride 0.9 % 100 mL IVPB        2 g 200 mL/hr over 30 Minutes Intravenous Every 24 hours 01/12/20 1111     01/02/20 2330  piperacillin-tazobactam (ZOSYN) IVPB 3.375 g  Status:  Discontinued  3.375 g 12.5 mL/hr over 240 Minutes Intravenous Every 8 hours 01/02/20 2242 01/12/20 1111       Time spent: 20 minutes    Erin Hearing ANP  [facility] 7 am- 330 pm/M-F 33 days

## 2020-02-05 ENCOUNTER — Inpatient Hospital Stay (HOSPITAL_COMMUNITY): Payer: Medicare HMO

## 2020-02-05 DIAGNOSIS — R5381 Other malaise: Secondary | ICD-10-CM | POA: Diagnosis not present

## 2020-02-05 DIAGNOSIS — J869 Pyothorax without fistula: Secondary | ICD-10-CM | POA: Diagnosis not present

## 2020-02-05 DIAGNOSIS — I517 Cardiomegaly: Secondary | ICD-10-CM | POA: Diagnosis not present

## 2020-02-05 DIAGNOSIS — Z8673 Personal history of transient ischemic attack (TIA), and cerebral infarction without residual deficits: Secondary | ICD-10-CM | POA: Diagnosis not present

## 2020-02-05 DIAGNOSIS — F0280 Dementia in other diseases classified elsewhere without behavioral disturbance: Secondary | ICD-10-CM | POA: Diagnosis not present

## 2020-02-05 MED ORDER — AMOXICILLIN-POT CLAVULANATE 875-125 MG PO TABS
1.0000 | ORAL_TABLET | Freq: Two times a day (BID) | ORAL | Status: AC
  Administered 2020-02-05 – 2020-02-16 (×24): 1 via ORAL
  Filled 2020-02-05 (×24): qty 1

## 2020-02-05 NOTE — Progress Notes (Addendum)
[facility] PROGRESS NOTE  Berny Agar [id number] DOB: [birth year] DOA: 01/02/2020 PCP: [name], [name], MD  Status: Remains inpatient appropriate because:Unsafe d/c plan   Dispo:  Patient From: HomePrivate residence  Planned Disposition: Skilled Nursing FacilitySNF  Expected discharge date: 02/07/2020  Medically stable for discharge: [name]; barriers to discharge: Needs SNF placement but Medicaid application in process and is pending.  Patient's son [name] will be assisting with this process; patient sister has also been assisting -CSW attempted to contact son on 1/03 with no response.   Code Status: DNR Family Communication: Son [name] 12/28 DVT prophylaxis: Lovenox Vaccination status: Covid vaccination status unknown, has received influenza vaccine as well as pneumococcal vaccine in October 2021  Foley catheter: No   HPI: 80 year old male with history of recent CVA in 11/2019 with residual dysarthria, hypertension, hyperlipidemia and spinal stenosis, initially presented to [facility] from [facility] SNF with confusion and difficulty breathing. Work-up revealed right-sided loculated pleural effusion with concern for empyema. IR at [facility] attempted ultrasound-guided chest tube placement but were unable due to thickness of pleural fluid. He was evaluated by general surgery at [facility] who recommended transfer to [facility] for thoracic surgery evaluation. Patient was evaluated by thoracic surgery who recommended chest tube placement by IR, that was performed on 01/03/2020. ID consulted and recommended transitioning from IV Zosyn to IV ceftriaxone and oral Flagyl while in-house and discharge on p.o. Augmentin for a total of 6 weeks with start date of 01/03/2020. Right-sided chest tube removed 12/16. TCTS signed off 12/17 and advised no outpatient follow-up needed.  Patient stable for discharge.  PT/OT recommending SNF.  Patient will need long-term Medicaid in place before  can discharge.  Subjective: Reports is sleepy today and not ready to get out of bed.  Also wants to wait before breakfast tray set up.  Objective: Vitals:   02/04/20 2236 02/05/20 0527  BP: 136/79 137/76  Pulse: 72 71  Resp: 16 16  Temp: 97.9 F (36.6 C) 97.9 F (36.6 C)  SpO2: 100% 100%    Intake/Output Summary (Last 24 hours) at 02/05/2020 1304 Last data filed at 02/05/2020 1200 Gross per 24 hour  Intake 340 ml  Output 1250 ml  Net -910 ml   Filed Weights   01/03/20 1414  Weight: 66.8 kg    Exam: Constitutional: Awakened, calm, smiles during conversation Respiratory: Stable on room air with oxygen saturations 100%, anterior lung sounds clear Cardiovascular: Regular pulse without tachycardia.  BP stable.  Heart sounds normal S1-S2 Abdomen: Bowel sounds present, abdomen soft nontender nondistended normoactive bowel sounds.  LBM 1/05 Neurologic: CN 2-12 grossly intact. Sensation intact,  Strength 5/5 x on left slightly weaker 4/5 on the right Psychiatric: alert and oriented x name only. Flat affect   Assessment/Plan: Acute problems: Acute hypoxemic respiratory failure  2/2 right-sided parapneumonic effusion, empyema  -Hypoxemia (resolved)-TCTS consulted -s/p pigtail chest tube on 01/03/2020 and was removed on 12/16. -Follow-up chest x-ray 12/17 without pneumothorax.   -Thoracic surgery signed off 12/17, prn OP follow-up recommend  -Continue antibiotics (currently on ceftriaxone and metronidazole initially was treated with IV Zosyn beginning on 12/3) for a total of 6 weeks with expected last dose 02/17/2020.  1/5: discussed with ID and will transition to Augmentin today  Physical deconditioning -PT and OT recommending SNF. -Evaluated today with documentation that he is very motivated but did asked to go back to bed after walking.  Encourage more frequent out of bed so he will build endurance.  Patient finally  encouraged to stay up in chair.  Hypertension -Controlled off  antihypertensive medication  Severe protein calorie malnutrition Nutrition Problem: Severe Malnutrition Etiology: chronic illness (CVA) Signs/Symptoms: moderate fat depletion,severe fat depletion,moderate muscle depletion,severe muscle depletion Interventions: Ensure Enlive (each supplement provides 350kcal and 20 grams of protein),Magic cup,MVI -As of 12/23 patient able to feed self and eating meals well (between 50% and 100% of meals over the past 24 hours) Estimated body mass index is 21.13 kg/m as calculated from the following:   Height as of this encounter: 5\' 10"  (1.778 m).   Weight as of this encounter: 66.8 kg.   Wounds: Incision (Closed) 01/03/20 (Active)  Date First Assessed/Time First Assessed: 01/03/20 2000      Assessments 01/03/2020  8:00 PM 02/05/2020  8:01 AM  Dressing Type -- None  Dressing Dry;Clean;Intact --  Site / Wound Assessment Dressing in place / Unable to assess --  Drainage Amount -- None  Drainage Description Serosanguineous No odor     No Linked orders to display     Other problems: Acute kidney injury -Resolved -Current creatinine 1.01 with a GFR greater than 60  History of CVA/dementia without behavioral disturbances -Remains normotensive off of BP medication -Continue Plavix and low-dose aspirin -Continue statin/Crestor -Continue Cymbalta  Anemia of chronic disease -Hemoglobin stable around 10   Data Reviewed: Basic Metabolic Panel: Recent Labs  Lab 01/30/20 0636  CREATININE 1.08   Liver Function Tests: No results for input(s): AST, ALT, ALKPHOS, BILITOT, PROT, ALBUMIN in the last 168 hours. No results for input(s): LIPASE, AMYLASE in the last 168 hours. No results for input(s): AMMONIA in the last 168 hours. CBC: No results for input(s): WBC, NEUTROABS, HGB, HCT, MCV, PLT in the last 168 hours. Cardiac Enzymes: No results for input(s): CKTOTAL, CKMB, CKMBINDEX, TROPONINI in the last 168 hours. BNP (last 3 results) Recent Labs     12/26/19 1510 01/01/20 1018  BNP 88.7 177.1*    ProBNP (last 3 results) No results for input(s): PROBNP in the last 8760 hours.  CBG: No results for input(s): GLUCAP in the last 168 hours.  No results found for this or any previous visit (from the past 240 hour(s)).   Studies: No results found.  Scheduled Meds: . aspirin EC  81 mg Oral Daily  . clopidogrel  75 mg Oral Daily  . DULoxetine  60 mg Oral Daily  . enoxaparin (LOVENOX) injection  40 mg Subcutaneous Daily  . feeding supplement  237 mL Oral TID BM  . metroNIDAZOLE  500 mg Oral Q8H  . multivitamin with minerals  1 tablet Oral Daily  . rosuvastatin  10 mg Oral Daily   Continuous Infusions: . cefTRIAXone (ROCEPHIN)  IV 2 g (02/05/20 0905)    Active Problems:   Empyema of lung ([facility])   Severe sepsis without septic shock ([facility])   History of CVA in adulthood   Physical deconditioning   Dementia associated with other underlying disease without behavioral disturbance ([facility])   Consultants:  TCTS  Conventional radiology  Procedures:  Chest tube placement 12/3 by IR  Antibiotics: Anti-infectives (From admission, onward)   Start     Dose/Rate Route Frequency Ordered Stop   01/12/20 1400  metroNIDAZOLE (FLAGYL) tablet 500 mg        500 mg Oral Every 8 hours 01/12/20 1111     01/12/20 1300  cefTRIAXone (ROCEPHIN) 2 g in sodium chloride 0.9 % 100 mL IVPB        2 g 200 mL/hr over  30 Minutes Intravenous Every 24 hours 01/12/20 1111     01/02/20 2330  piperacillin-tazobactam (ZOSYN) IVPB 3.375 g  Status:  Discontinued        3.375 g 12.5 mL/hr over 240 Minutes Intravenous Every 8 hours 01/02/20 2242 01/12/20 1111       Time spent: 20 minutes    Erin Hearing ANP  [facility] 7 am- 330 pm/M-F 34 days

## 2020-02-05 NOTE — Progress Notes (Signed)
No Charge Progress Note  Chart briefly reviewed.  I remember taking care of this patient a few weeks ago.  Subjective: I went by to see him this morning.  Patient sitting up in bed.  He had finished eating a bowl of oatmeal but had not touched much of his bread with jam but was still working on it.  He reported feeling "okay" and denied complaints including dyspnea or pain.  Objective: Today's Vitals   02/04/20 2048 02/04/20 2236 02/05/20 0527 02/05/20 0800  BP:  136/79 137/76   Pulse:  72 71   Resp:  16 16   Temp:  97.9 F (36.6 C) 97.9 F (36.6 C)   TempSrc:  Oral Oral   SpO2:  100% 100%   Weight:      Height:      PainSc: 0-No pain   0-No pain   Body mass index is 21.13 kg/m.  General exam: Pleasant elderly male, moderately built and poorly nourished sitting up comfortably in bed without distress.  Was on room air. Respiratory system: Slightly diminished breath sounds in the bases but otherwise clear to auscultation.  No increased work of breathing. Cardiovascular system: S1 and S2 heard, RRR.  No JVD, murmurs or pedal edema. CNS: Alert and oriented only to self.  Appeared to know that he was in the hospital.  No focal neurological deficits. Extremities: No lower extremity edema.  Labs No recent labs since 12/30.  Creatinine at that time was 1.08.  Assessment and plan:  1. Acute respiratory failure with hypoxia due to right-sided empyema: Hypoxia resolved.  Chest tube has been out for couple of weeks.  Remains on IV ceftriaxone and metronidazole.  As per ID note from 12/11, recommended 6 weeks of antibiotics starting 12/3.  Has been on IV ceftriaxone and oral Flagyl since 12/12 but another IV antibiotics since 11/25.  Given his prolonged hospital stay mostly due to placement issues, worthwhile discussing with ID to review duration of antibiotics and if further antibiotics recommended, could this now be switched to oral Augmentin as per the earlier recommendation.  Discussed  with Ms. Junious Silk, NP who is mainly following this patient on the very long length of stay patient team. 2. Rest of problems as per Ms. Rennis Harding note.  Marcellus Scott, MD, [place], [facility]. [facility]  To contact the attending provider between 7A-7P or the covering provider during after hours 7P-7A, please log into the web site [email or web address] and access using universal [place] password for that web site. If you do not have the password, please call the hospital operator.

## 2020-02-05 NOTE — Progress Notes (Signed)
Nutrition Follow-up  DOCUMENTATION CODES:   Severe malnutrition in context of chronic illness  INTERVENTION:   -ContinueEnsure Enlive poTID, each supplement provides 350 kcal and 20 grams of protein -ContinueMagic cup TID with meals, each supplement provides 290 kcal and 9 grams of protein -Continue MVI with minerals daily  NUTRITION DIAGNOSIS:   Severe Malnutrition related to chronic illness (CVA) as evidenced by moderate fat depletion,severe fat depletion,moderate muscle depletion,severe muscle depletion.  Ongoing  GOAL:   Patient will meet greater than or equal to 90% of their needs  Progressing   MONITOR:   PO intake,Supplement acceptance,Labs,Weight trends,Skin,I & O's  REASON FOR ASSESSMENT:   Malnutrition Screening Tool    ASSESSMENT:   [name] [name] is a 80 y.o. male with medical history significant for CVA in October 2021 with residual dysarthria, essential hypertension, hyperlipidemia, spinal stenosis who initially presented at [facility] from [facility] SNF due to confusion and right-sided pneumonia.  12/3- s/pPlacement of a rightchest tube. 14F Thal. To pleural evac system. 12/16- chest tube removed  Reviewed I/O's: -60 ml x 24 hours and -10 L since 01/22/20  UOP: 600 ml x 24 hours  Pt remains with good oral intake. Noted meal completion 75-100%. Pt is also consuming Ensure supplements.  Per TOC notes, pt awaiting medicaid application and long term SNF placement.   Labs reviewed.   Diet Order:   Diet Order            Diet regular Room service appropriate? Yes; Fluid consistency: Thin  Diet effective now                 EDUCATION NEEDS:   Education needs have been addressed  Skin:  Skin Assessment: Skin Integrity Issues: Skin Integrity Issues:: Other (Comment) Other: chest tube removed on 01/16/20  Last BM:  02/05/20  Height:   Ht Readings from Last 1 Encounters:  01/03/20 5\' 10"  (1.778 m)     Weight:   Wt Readings from Last 1 Encounters:  01/03/20 66.8 kg    Ideal Body Weight:  74.5 kg  BMI:  Body mass index is 21.13 kg/m.  Estimated Nutritional Needs:   Kcal:  2150-2350  Protein:  120-135 grams  Fluid:  > 2 L    01-08-1990, RD, LDN, CDCES Registered Dietitian II Certified Diabetes Care and Education Specialist Please refer to [facility] for RD and/or RD on-call/weekend/after hours pager

## 2020-02-05 NOTE — Progress Notes (Signed)
Physical Therapy Treatment Patient Details Name: [name] [name] MRN: [id number] DOB: 1940-12-05 Today's Date: 02/05/2020    History of Present Illness Pt is a 80 y.o. male (from SNF) admitted 01/02/20 as transfer from [facility] (admitted there 12/26/19) for CTS evaluation for possible VATS decortication. Workup revealed R-side loculated pleural efffusion with concern for empyema, possible PNA. PMH includes lumbar stenosis, renal disorder.    PT Comments    Patient received in bed, agreeable to PT session. "I'll see what I can do." Patient found to be soiled. Assisted RN in cleaning patient, rolling in bed. Patient requires mod assist with all bed mobility and transfers. Limited activity tolerance due to fatigue. Increased time needed for all mobility. He is able to ambulate 5 feet x2 with RW and min assist for safety. Max cues for postioning, direction, safety. He will continue to benefit from skilled PT while here to improve strength and functional independence.          Follow Up Recommendations  SNF;Supervision for mobility/OOB     Equipment Recommendations  Other (comment) (TBD)    Recommendations for Other Services       Precautions / Restrictions Precautions Precautions: Fall Precaution Comments: bladder and bowel incontinence Restrictions Weight Bearing Restrictions: No    Mobility  Bed Mobility Overal bed mobility: Needs Assistance Bed Mobility: Rolling;Sidelying to Sit Rolling: Mod assist Sidelying to sit: Mod assist          Transfers Overall transfer level: Needs assistance Equipment used: Rolling walker (2 wheeled) Transfers: Sit to/from Stand Sit to Stand: Mod assist         General transfer comment: STS from bed and recliner with mod assist, increased time and increased cues for safety/hand placement  Ambulation/Gait Ambulation/Gait assistance: Min assist Gait Distance (Feet): 10 Feet (5+5) Assistive device: Rolling walker (2 wheeled) Gait  Pattern/deviations: Trunk flexed;Step-to pattern;Decreased step length - right;Decreased step length - left;Shuffle Gait velocity: decr   General Gait Details: Requires min assist for balance, cues for upright posture, hand placement, directional cues   Stairs             Wheelchair Mobility    Modified Rankin (Stroke Patients Only)       Balance Overall balance assessment: Needs assistance Sitting-balance support: Feet supported;Bilateral upper extremity supported Sitting balance-Leahy Scale: Fair     Standing balance support: Bilateral upper extremity supported;During functional activity Standing balance-Leahy Scale: Poor Standing balance comment: reliant on BUE support and external assist                            Cognition Arousal/Alertness: Awake/alert Behavior During Therapy: Flat affect Overall Cognitive Status: History of cognitive impairments - at baseline Area of Impairment: Following commands;Safety/judgement;Awareness;Problem solving                 Orientation Level: Disoriented to;Time;Situation Current Attention Level: Focused Memory: Decreased recall of precautions;Decreased short-term memory Following Commands: Follows one step commands inconsistently;Follows one step commands with increased time Safety/Judgement: Decreased awareness of safety;Decreased awareness of deficits Awareness: Intellectual Problem Solving: Slow processing;Requires verbal cues;Requires tactile cues General Comments: slow response to cues for hand placement for stand or sit transition      Exercises Other Exercises Other Exercises: Heel slides, ap, hip abd/add x 10 reps with assist. BLE    General Comments        Pertinent Vitals/Pain Pain Assessment: No/denies pain    Home Living  Prior Function            PT Goals (current goals can now be found in the care plan section) Acute Rehab PT Goals Patient Stated  Goal: none stated Time For Goal Achievement: 02/19/20 Potential to Achieve Goals: Fair Progress towards PT goals: Not progressing toward goals - comment (continues to require mod assist for most mobility. Limited activity tolerance.)    Frequency    Min 2X/week      PT Plan Current plan remains appropriate    Co-evaluation              AM-PAC PT "6 Clicks" Mobility   Outcome Measure  Help needed turning from your back to your side while in a flat bed without using bedrails?: A Lot Help needed moving from lying on your back to sitting on the side of a flat bed without using bedrails?: A Lot Help needed moving to and from a bed to a chair (including a wheelchair)?: A Lot Help needed standing up from a chair using your arms (e.g., wheelchair or bedside chair)?: A Lot Help needed to walk in hospital room?: A Lot Help needed climbing 3-5 steps with a railing? : Total 6 Click Score: 11    End of Session Equipment Utilized During Treatment: Gait belt Activity Tolerance: Patient limited by fatigue Patient left: in chair;with call bell/phone within reach;with chair alarm set Nurse Communication: Mobility status PT Visit Diagnosis: Difficulty in walking, not elsewhere classified (R26.2);Muscle weakness (generalized) (M62.81)     Time: 0037-0488 PT Time Calculation (min) (ACUTE ONLY): 32 min  Charges:  $Gait Training: 8-22 mins $Therapeutic Activity: 8-22 mins                     Kenneth Cuaresma, PT, GCS 02/05/20,3:19 PM

## 2020-02-06 ENCOUNTER — Other Ambulatory Visit: Payer: Self-pay | Admitting: Family Medicine

## 2020-02-06 DIAGNOSIS — Z8673 Personal history of transient ischemic attack (TIA), and cerebral infarction without residual deficits: Secondary | ICD-10-CM | POA: Diagnosis not present

## 2020-02-06 DIAGNOSIS — I10 Essential (primary) hypertension: Secondary | ICD-10-CM

## 2020-02-06 DIAGNOSIS — R5381 Other malaise: Secondary | ICD-10-CM | POA: Diagnosis not present

## 2020-02-06 DIAGNOSIS — F0280 Dementia in other diseases classified elsewhere without behavioral disturbance: Secondary | ICD-10-CM | POA: Diagnosis not present

## 2020-02-06 DIAGNOSIS — J869 Pyothorax without fistula: Secondary | ICD-10-CM | POA: Diagnosis not present

## 2020-02-06 NOTE — Progress Notes (Signed)
Occupational Therapy Treatment Patient Details Name: [name] [name] MRN: [id number] DOB: Aug 14, 1940 Today's Date: 02/06/2020    History of present illness Pt is a 80 y.o. male (from SNF) admitted 01/02/20 as transfer from [facility] (admitted there 12/26/19) for CTS evaluation for possible VATS decortication. Workup revealed R-side loculated pleural efffusion with concern for empyema, possible PNA. PMH includes lumbar stenosis, renal disorder.   OT comments  Pt making steady progress with functional goals. OT will continue to follow acutely to maximize level of function and safety  Follow Up Recommendations  SNF;Supervision/Assistance - 24 hour    Equipment Recommendations  None recommended by OT    Recommendations for Other Services      Precautions / Restrictions Precautions Precautions: Fall Precaution Comments: bladder and bowel incontinence Restrictions Weight Bearing Restrictions: No       Mobility Bed Mobility Overal bed mobility: Needs Assistance       Supine to sit: Mod assist        Transfers Overall transfer level: Needs assistance Equipment used: Rolling walker (2 wheeled) Transfers: Sit to/from Stand Sit to Stand: Mod assist Stand pivot transfers: Mod assist       General transfer comment: increased time and increased cues for safety/hand placement    Balance Overall balance assessment: Needs assistance Sitting-balance support: Feet supported;Bilateral upper extremity supported Sitting balance-Leahy Scale: Fair     Standing balance support: Bilateral upper extremity supported;During functional activity Standing balance-Leahy Scale: Poor                             ADL either performed or assessed with clinical judgement   ADL Overall ADL's : Needs assistance/impaired     Grooming: Wash/dry face;Sitting;Wash/dry hands;Min guard   Upper Body Bathing: Min guard Upper Body Bathing Details (indicate cue type and reason):  simulated seated EOB     Upper Body Dressing : Min guard;Sitting Upper Body Dressing Details (indicate cue type and reason): donned clean gown seated in recliner     Toilet Transfer: Stand-pivot;RW;Moderate assistance;BSC   Toileting- Clothing Manipulation and Hygiene: Total assistance;Sit to/from stand Toileting - Clothing Manipulation Details (indicate cue type and reason): total A for posterior pericare after incontinent BM     Functional mobility during ADLs: Rolling walker;Moderate assistance       Vision Patient Visual Report: No change from baseline     Perception     Praxis      Cognition Arousal/Alertness: Awake/alert Behavior During Therapy: Flat affect Overall Cognitive Status: History of cognitive impairments - at baseline Area of Impairment: Following commands;Safety/judgement;Awareness;Problem solving                 Orientation Level: Disoriented to;Time;Situation   Memory: Decreased recall of precautions;Decreased short-term memory Following Commands: Follows one step commands inconsistently;Follows one step commands with increased time Safety/Judgement: Decreased awareness of safety;Decreased awareness of deficits   Problem Solving: Slow processing;Requires verbal cues;Requires tactile cues          Exercises     Shoulder Instructions       General Comments      Pertinent Vitals/ Pain       Pain Assessment: No/denies pain Pain Score: 0-No pain Faces Pain Scale: No hurt Pain Intervention(s): Monitored during session  Home Living  Prior Functioning/Environment              Frequency  Min 2X/week        Progress Toward Goals  OT Goals(current goals can now be found in the care plan section)  Progress towards OT goals: Progressing toward goals  Acute Rehab OT Goals Patient Stated Goal: none stated ADL Goals Pt Will Perform Grooming: with supervision;sitting   Plan Discharge plan remains appropriate    Co-evaluation                 AM-PAC OT "6 Clicks" Daily Activity     Outcome Measure   Help from another person eating meals?: None Help from another person taking care of personal grooming?: A Little Help from another person toileting, which includes using toliet, bedpan, or urinal?: A Lot Help from another person bathing (including washing, rinsing, drying)?: A Lot Help from another person to put on and taking off regular upper body clothing?: A Little Help from another person to put on and taking off regular lower body clothing?: Total 6 Click Score: 15    End of Session Equipment Utilized During Treatment: Gait belt;Rolling walker;Other (comment) (BSC)  OT Visit Diagnosis: Unsteadiness on feet (R26.81);Other abnormalities of gait and mobility (R26.89);Muscle weakness (generalized) (M62.81);Other symptoms and signs involving cognitive function   Activity Tolerance Patient tolerated treatment well   Patient Left in chair;with call bell/phone within reach;with chair alarm set   Nurse Communication          Time: [id number] OT Time Calculation (min): 19 min  Charges: OT General Charges $OT Visit: 1 Visit OT Treatments $Self Care/Home Management : 8-22 mins     Britt Bottom 02/06/2020, 1:50 PM

## 2020-02-06 NOTE — Progress Notes (Addendum)
[facility] PROGRESS NOTE  [name] [name] [id number] DOB: 1940/06/14 DOA: 01/02/2020 PCP: [place], [name], MD  Status: Remains inpatient appropriate because:Unsafe d/c plan   Dispo:  Patient From: HomePrivate residence  Planned Disposition: Skilled Nursing FacilitySNF  Expected discharge date: 02/10/2020  Medically stable for discharge: [name] [name]; barriers to discharge: Needs SNF placement but Medicaid application in process and is pending.  Patient's son [name] [name] will be assisting with this process; patient sister has also been assisting -CSW attempted to contact son on 1/03 with no response.   Code Status: DNR Family Communication: Son [name] [name] 12/28 DVT prophylaxis: Lovenox Vaccination status: Covid vaccination status unknown, has received influenza vaccine as well as pneumococcal vaccine in October 2021  Foley catheter: No   HPI: 80 year old male with history of recent CVA in 11/2019 with residual dysarthria, hypertension, hyperlipidemia and spinal stenosis, initially presented to [facility] from [facility] SNF with confusion and difficulty breathing. Work-up revealed right-sided loculated pleural effusion with concern for empyema. IR at [facility] attempted ultrasound-guided chest tube placement but were unable due to thickness of pleural fluid. He was evaluated by general surgery at [facility] who recommended transfer to [facility] for thoracic surgery evaluation. Patient was evaluated by thoracic surgery who recommended chest tube placement by IR, that was performed on 01/03/2020. ID consulted and recommended transitioning from IV Zosyn to IV ceftriaxone and oral Flagyl while in-house and discharge on p.o. Augmentin for a total of 6 weeks with start date of 01/03/2020. Right-sided chest tube removed 12/16. TCTS signed off 12/17 and advised no outpatient follow-up needed.  Patient stable for discharge.  PT/OT recommending SNF.  Patient will need long-term Medicaid in place before  can discharge.  Subjective: Awakened, sleepy but appears to be in good spirits.  Requesting that someone help him with his breakfast tray when it arrives.  Objective: Vitals:   02/06/20 0614 02/06/20 1305  BP: (!) 144/87 121/75  Pulse: 70 80  Resp: 16   Temp: 97.9 F (36.6 C) 97.9 F (36.6 C)  SpO2: 99% 99%    Intake/Output Summary (Last 24 hours) at 02/06/2020 1415 Last data filed at 02/06/2020 1027 Gross per 24 hour  Intake 180 ml  Output 600 ml  Net -420 ml   Filed Weights   01/03/20 1414  Weight: 66.8 kg    Exam: Constitutional: Awakened, calm, smiles  Respiratory: Room air with oxygen saturations 100%,Lung sounds clear Cardiovascular: Regular pulse without tachycardia. Heart sounds normal S1-S2 Abdomen: Bowel sounds present, abdomen soft nontender nondistended normoactive bowel sounds.  LBM 1/06 Neurologic: CN 2-12 grossly intact. Sensation intact,  Strength 5/5 x on left slightly weaker 4/5 on the right Psychiatric: alert and oriented x name only. Flat affect   Assessment/Plan: Acute problems: Acute hypoxemic respiratory failure  2/2 right-sided parapneumonic effusion, empyema  **SEPSIS RULED OUT** -Hypoxemia (resolved)-TCTS consulted -s/p pigtail chest tube on 01/03/2020 and was removed on 12/16. -Follow-up chest x-ray 12/17 without pneumothorax.   -Thoracic surgery signed off 12/17, prn OP follow-up recommend  -Continue antibiotics (currently on ceftriaxone and metronidazole initially was treated with IV Zosyn beginning on 12/3) for a total of 6 weeks with expected last dose 02/17/2020.  1/5: discussed with ID transitioned to Augmentin  Physical deconditioning -PT and OT recommending SNF. -Evaluated today with documentation that he is very motivated but did asked to go back to bed after walking.  Encourage more frequent out of bed so he will build endurance.  Patient finally encouraged to stay up in chair.  Hypertension -  Controlled off antihypertensive  medication  Severe protein calorie malnutrition Nutrition Problem: Severe Malnutrition Etiology: chronic illness (CVA) Signs/Symptoms: moderate fat depletion,severe fat depletion,moderate muscle depletion,severe muscle depletion Interventions: Ensure Enlive (each supplement provides 350kcal and 20 grams of protein),Magic cup,MVI -As of 12/23 patient able to feed self and eating meals well (between 50% and 100% of meals over the past 24 hours) Estimated body mass index is 21.13 kg/m as calculated from the following:   Height as of this encounter: 5\' 10"  (1.778 m).   Weight as of this encounter: 66.8 kg.   Wounds: Incision (Closed) 01/03/20 (Active)  Date First Assessed/Time First Assessed: 01/03/20 2000      Assessments 01/03/2020  8:00 PM 02/05/2020  8:01 AM  Dressing Type -- None  Dressing Dry;Clean;Intact --  Site / Wound Assessment Dressing in place / Unable to assess --  Drainage Amount -- None  Drainage Description Serosanguineous No odor     No Linked orders to display     Other problems: Acute kidney injury -Resolved -Current creatinine 1.01 with a GFR greater than 60  History of CVA/dementia without behavioral disturbances -Remains normotensive off of BP medication -Continue Plavix and low-dose aspirin -Continue statin/Crestor -Continue Cymbalta  Anemia of chronic disease -Hemoglobin stable around 10   Data Reviewed: Basic Metabolic Panel: No results for input(s): NA, K, CL, CO2, GLUCOSE, BUN, CREATININE, CALCIUM, MG, PHOS in the last 168 hours. Liver Function Tests: No results for input(s): AST, ALT, ALKPHOS, BILITOT, PROT, ALBUMIN in the last 168 hours. No results for input(s): LIPASE, AMYLASE in the last 168 hours. No results for input(s): AMMONIA in the last 168 hours. CBC: No results for input(s): WBC, NEUTROABS, HGB, HCT, MCV, PLT in the last 168 hours. Cardiac Enzymes: No results for input(s): CKTOTAL, CKMB, CKMBINDEX, TROPONINI in the last 168  hours. BNP (last 3 results) Recent Labs    12/26/19 1510 01/01/20 1018  BNP 88.7 177.1*    ProBNP (last 3 results) No results for input(s): PROBNP in the last 8760 hours.  CBG: No results for input(s): GLUCAP in the last 168 hours.  No results found for this or any previous visit (from the past 240 hour(s)).   Studies: DG Chest 2 View  Result Date: 02/05/2020 CLINICAL DATA:  Empyema EXAM: CHEST - 2 VIEW COMPARISON:  01/17/2020 FINDINGS: No significant interval change in chest radiographs with small, layering bilateral pleural effusions, right greater than left, with associated atelectasis or consolidation. No new or focal airspace opacity. Mild cardiomegaly. Disc degenerative disease of the thoracic spine. IMPRESSION: 1. No significant interval change in chest radiographs with small, layering bilateral pleural effusions, right greater than left, with associated atelectasis or consolidation. No new or focal airspace opacity. 2.  Mild cardiomegaly. Electronically Signed   By: Eddie Candle M.D.   On: 02/05/2020 14:03    Scheduled Meds: . amoxicillin-clavulanate  1 tablet Oral Q12H  . aspirin EC  81 mg Oral Daily  . clopidogrel  75 mg Oral Daily  . DULoxetine  60 mg Oral Daily  . enoxaparin (LOVENOX) injection  40 mg Subcutaneous Daily  . feeding supplement  237 mL Oral TID BM  . multivitamin with minerals  1 tablet Oral Daily  . rosuvastatin  10 mg Oral Daily   Continuous Infusions:   Active Problems:   Empyema lung (HCC)   Severe sepsis without septic shock ([place])   History of CVA in adulthood   Physical deconditioning   Dementia associated with other underlying disease without  behavioral disturbance ([place])   Consultants:  TCTS  Conventional radiology  Procedures:  Chest tube placement 12/3 by IR  Antibiotics: Anti-infectives (From admission, onward)   Start     Dose/Rate Route Frequency Ordered Stop   02/05/20 1430  amoxicillin-clavulanate (AUGMENTIN) 875-125 MG  per tablet 1 tablet        1 tablet Oral Every 12 hours 02/05/20 1338 02/17/20 0959   01/12/20 1400  metroNIDAZOLE (FLAGYL) tablet 500 mg  Status:  Discontinued        500 mg Oral Every 8 hours 01/12/20 1111 02/05/20 1337   01/12/20 1300  cefTRIAXone (ROCEPHIN) 2 g in sodium chloride 0.9 % 100 mL IVPB  Status:  Discontinued        2 g 200 mL/hr over 30 Minutes Intravenous Every 24 hours 01/12/20 1111 02/05/20 1337   01/02/20 2330  piperacillin-tazobactam (ZOSYN) IVPB 3.375 g  Status:  Discontinued        3.375 g 12.5 mL/hr over 240 Minutes Intravenous Every 8 hours 01/02/20 2242 01/12/20 1111       Time spent: 20 minutes    Erin Hearing ANP  [facility] 7 am- 330 pm/M-F 35 days

## 2020-02-07 DIAGNOSIS — J869 Pyothorax without fistula: Secondary | ICD-10-CM | POA: Diagnosis not present

## 2020-02-07 DIAGNOSIS — F0280 Dementia in other diseases classified elsewhere without behavioral disturbance: Secondary | ICD-10-CM | POA: Diagnosis not present

## 2020-02-07 DIAGNOSIS — Z8673 Personal history of transient ischemic attack (TIA), and cerebral infarction without residual deficits: Secondary | ICD-10-CM | POA: Diagnosis not present

## 2020-02-07 DIAGNOSIS — R5381 Other malaise: Secondary | ICD-10-CM | POA: Diagnosis not present

## 2020-02-07 NOTE — Progress Notes (Signed)
[facility] PROGRESS NOTE  [name] [name] [id number] DOB: 08/20/1940 DOA: 01/02/2020 PCP: [name], [name], MD  Status: Remains inpatient appropriate because:Unsafe d/c plan   Dispo:  Patient From: HomePrivate residence  Planned Disposition: Skilled Nursing FacilitySNF  Expected discharge date: 02/10/2020  Medically stable for discharge: [name] [name]; barriers to discharge: Needs SNF placement but Medicaid application in process and is pending.  Patient's son [name] [name] will be assisting with this process; patient sister has also been assisting -CSW attempted to contact son on 1/03 with no response.   Code Status: DNR Family Communication: Son [name] [name] 1/07 DVT prophylaxis: Lovenox Vaccination status: Covid vaccination status unknown, has received influenza vaccine as well as pneumococcal vaccine in October 2021  Foley catheter:  No   HPI: 80 year old male with history of recent CVA in 11/2019 with residual dysarthria, hypertension, hyperlipidemia and spinal stenosis, initially presented to [facility] from [facility] SNF with confusion and difficulty breathing. Work-up revealed right-sided loculated pleural effusion with concern for empyema. IR at [facility] attempted ultrasound-guided chest tube placement but were unable due to thickness of pleural fluid. He was evaluated by general surgery at [facility] who recommended transfer to [facility] for thoracic surgery evaluation. Patient was evaluated by thoracic surgery who recommended chest tube placement by IR, that was performed on 01/03/2020. ID consulted and recommended transitioning from IV Zosyn to IV ceftriaxone and oral Flagyl while in-house and discharge on p.o. Augmentin for a total of 6 weeks with start date of 01/03/2020. Right-sided chest tube removed 12/16. TCTS signed off 12/17 and advised no outpatient follow-up needed.  Patient stable for discharge.  PT/OT recommending SNF.  Patient will need long-term Medicaid in place before  can discharge.  Subjective: Awake.  Just received breakfast tray.  States he wants to get out of bed to chair to eat.  Objective: Vitals:   02/06/20 2142 02/07/20 0455  BP: 125/74 (!) 129/91  Pulse: 71 67  Resp: 16 16  Temp: 98.5 F (36.9 C) (!) 97.4 F (36.3 C)  SpO2: 95% 100%    Intake/Output Summary (Last 24 hours) at 02/07/2020 1405 Last data filed at 02/07/2020 0900 Gross per 24 hour  Intake 120 ml  Output 1501 ml  Net -1381 ml   Filed Weights   01/03/20 1414  Weight: 66.8 kg    Exam: Constitutional: Awake, no acute distress Respiratory: Room air with oxygen saturations 100%,Lung sounds clear Cardiovascular: Regular pulse without tachycardia. Heart sounds normal S1-S2 Abdomen: Bowel sounds present, abdomen soft nontender nondistended normoactive bowel sounds.  LBM 1/06 Neurologic: CN 2-12 grossly intact. Sensation intact,  Strength 5/5 x on left slightly weaker 4/5 on the right Psychiatric: alert and oriented x name only. Flat affect   Assessment/Plan: Acute problems: Acute hypoxemic respiratory failure  2/2 right-sided parapneumonic effusion, empyema  **SEPSIS RULED OUT** -Hypoxemia (resolved)-TCTS consulted -s/p pigtail chest tube on 01/03/2020 and was removed on 12/16. -Follow-up chest x-ray 12/17 without pneumothorax.   -Thoracic surgery signed off 12/17, prn OP follow-up recommend  -Continue antibiotics (currently on ceftriaxone and metronidazole initially was treated with IV Zosyn beginning on 12/3) for a total of 6 weeks with expected last dose 02/17/2020.  1/5: discussed with ID transitioned to Augmentin  Physical deconditioning -PT and OT recommending SNF. -Evaluated today with documentation that he is very motivated but did asked to go back to bed after walking.  Encourage more frequent out of bed so he will build endurance.  Patient finally encouraged to stay up in chair.  Hypertension -Controlled  off antihypertensive medication  Severe protein calorie  malnutrition Nutrition Problem: Severe Malnutrition Etiology: chronic illness (CVA) Signs/Symptoms: moderate fat depletion,severe fat depletion,moderate muscle depletion,severe muscle depletion Interventions: Ensure Enlive (each supplement provides 350kcal and 20 grams of protein),Magic cup,MVI -As of 12/23 patient able to feed self and eating meals well (between 50% and 100% of meals over the past 24 hours) Estimated body mass index is 21.13 kg/m as calculated from the following:   Height as of this encounter: 5\' 10"  (1.778 m).   Weight as of this encounter: 66.8 kg.   Wounds: Incision (Closed) 01/03/20 (Active)  Date First Assessed/Time First Assessed: 01/03/20 2000      Assessments 01/03/2020  8:00 PM 02/06/2020  8:00 PM  Dressing Type - None  Dressing Dry;Clean;Intact Clean;Dry;Intact  Dressing Change Frequency - PRN  Site / Wound Assessment Dressing in place / Unable to assess Dry;Clean  Drainage Description Serosanguineous -     No Linked orders to display     Other problems: Acute kidney injury -Resolved -Current creatinine 1.01 with a GFR greater than 60  History of CVA/dementia without behavioral disturbances -Remains normotensive off of BP medication -Continue Plavix and low-dose aspirin -Continue statin/Crestor -Continue Cymbalta  Anemia of chronic disease -Hemoglobin stable around 10   Data Reviewed: Basic Metabolic Panel: No results for input(s): NA, K, CL, CO2, GLUCOSE, BUN, CREATININE, CALCIUM, MG, PHOS in the last 168 hours. Liver Function Tests: No results for input(s): AST, ALT, ALKPHOS, BILITOT, PROT, ALBUMIN in the last 168 hours. No results for input(s): LIPASE, AMYLASE in the last 168 hours. No results for input(s): AMMONIA in the last 168 hours. CBC: No results for input(s): WBC, NEUTROABS, HGB, HCT, MCV, PLT in the last 168 hours. Cardiac Enzymes: No results for input(s): CKTOTAL, CKMB, CKMBINDEX, TROPONINI in the last 168 hours. BNP (last 3  results) Recent Labs    12/26/19 1510 01/01/20 1018  BNP 88.7 177.1*    ProBNP (last 3 results) No results for input(s): PROBNP in the last 8760 hours.  CBG: No results for input(s): GLUCAP in the last 168 hours.  No results found for this or any previous visit (from the past 240 hour(s)).   Studies: No results found.  Scheduled Meds: . amoxicillin-clavulanate  1 tablet Oral Q12H  . aspirin EC  81 mg Oral Daily  . clopidogrel  75 mg Oral Daily  . DULoxetine  60 mg Oral Daily  . enoxaparin (LOVENOX) injection  40 mg Subcutaneous Daily  . feeding supplement  237 mL Oral TID BM  . multivitamin with minerals  1 tablet Oral Daily  . rosuvastatin  10 mg Oral Daily   Continuous Infusions:   Active Problems:   Empyema lung (HCC)   Severe sepsis without septic shock ([place])   History of CVA in adulthood   Physical deconditioning   Dementia associated with other underlying disease without behavioral disturbance ([place])   Consultants:  TCTS  Conventional radiology  Procedures:  Chest tube placement 12/3 by IR  Antibiotics: Anti-infectives (From admission, onward)   Start     Dose/Rate Route Frequency Ordered Stop   02/05/20 1430  amoxicillin-clavulanate (AUGMENTIN) 875-125 MG per tablet 1 tablet        1 tablet Oral Every 12 hours 02/05/20 1338 02/17/20 0959   01/12/20 1400  metroNIDAZOLE (FLAGYL) tablet 500 mg  Status:  Discontinued        500 mg Oral Every 8 hours 01/12/20 1111 02/05/20 1337   01/12/20 1300  cefTRIAXone (ROCEPHIN)  2 g in sodium chloride 0.9 % 100 mL IVPB  Status:  Discontinued        2 g 200 mL/hr over 30 Minutes Intravenous Every 24 hours 01/12/20 1111 02/05/20 1337   01/02/20 2330  piperacillin-tazobactam (ZOSYN) IVPB 3.375 g  Status:  Discontinued        3.375 g 12.5 mL/hr over 240 Minutes Intravenous Every 8 hours 01/02/20 2242 01/12/20 1111       Time spent: 20 minutes    Erin Hearing ANP  [facility] 7 am- 330 pm/M-F 36  days

## 2020-02-07 NOTE — Progress Notes (Signed)
Physical Therapy Treatment Patient Details Name: [name] [name] MRN: [id number] DOB: Apr 21, 1940 Today's Date: 02/07/2020    History of Present Illness Pt is a 80 y.o. male (from SNF) admitted 01/02/20 as transfer from [facility] (admitted there 12/26/19) for CTS evaluation for possible VATS decortication. Workup revealed R-side loculated pleural efffusion with concern for empyema, possible PNA. PMH includes lumbar stenosis, renal disorder.    PT Comments    Patient received in bed, awake. Reports he is just waking up. Agrees to PT session. Patient has breakfast tray in room. Patient requires min/mod assist for bed mobility. Use of bed rail and more mod assist for scooting to edge of bed. Patient found to be soiled upon standing. Assisted him with peri care in standing. Patient then able to ambulate a few feet to recliner with slow, shuffle steps. Cues for safety. Patient will continue to benefit from skilled PT while here to improve strength and functional mobility.      Follow Up Recommendations  SNF;Supervision for mobility/OOB     Equipment Recommendations  Other (comment) (TBD)    Recommendations for Other Services       Precautions / Restrictions Precautions Precautions: Fall Precaution Comments: bladder and bowel incontinence Restrictions Weight Bearing Restrictions: No    Mobility  Bed Mobility Overal bed mobility: Needs Assistance Bed Mobility: Supine to Sit     Supine to sit: Min assist     General bed mobility comments: patient uses rail to assist with supine to sit. Requires assist to scoot hips to edge of bed.  Transfers Overall transfer level: Needs assistance Equipment used: Rolling walker (2 wheeled) Transfers: Sit to/from Stand Sit to Stand: Mod assist;From elevated surface         General transfer comment: increased time. Patient found to be soiled when stood up, was able to stand while being cleaned up. Mod assist for standing from elevated  bed.  Ambulation/Gait Ambulation/Gait assistance: Min assist Gait Distance (Feet): 6 Feet Assistive device: Rolling walker (2 wheeled) Gait Pattern/deviations: Step-to pattern;Trunk flexed;Decreased stride length;Decreased step length - right;Decreased step length - left;Shuffle Gait velocity: decr   General Gait Details: Requires min assist for balance, cues for upright posture, hand placement, directional cues, fatigues   Stairs             Wheelchair Mobility    Modified Rankin (Stroke Patients Only)       Balance Overall balance assessment: Needs assistance Sitting-balance support: Feet supported Sitting balance-Leahy Scale: Good     Standing balance support: Bilateral upper extremity supported;During functional activity Standing balance-Leahy Scale: Fair Standing balance comment: reliant on BUE support and external assist                            Cognition Arousal/Alertness: Awake/alert Behavior During Therapy: Flat affect Overall Cognitive Status: History of cognitive impairments - at baseline Area of Impairment: Safety/judgement;Problem solving                 Orientation Level: Disoriented to;Situation Current Attention Level: Focused   Following Commands: Follows one step commands with increased time     Problem Solving: Slow processing;Requires verbal cues;Requires tactile cues General Comments: slow response to cues for hand placement for stand or sit transition      Exercises      General Comments        Pertinent Vitals/Pain Pain Assessment: Faces Faces Pain Scale: Hurts a little bit Pain Location: R rib  cage when donning gait belt Pain Descriptors / Indicators: Sore Pain Intervention(s): Monitored during session    Home Living                      Prior Function            PT Goals (current goals can now be found in the care plan section) Acute Rehab PT Goals Patient Stated Goal: none stated PT  Goal Formulation: With patient/family Time For Goal Achievement: 02/19/20 Progress towards PT goals: Progressing toward goals    Frequency    Min 2X/week      PT Plan Current plan remains appropriate    Co-evaluation              AM-PAC PT "6 Clicks" Mobility   Outcome Measure  Help needed turning from your back to your side while in a flat bed without using bedrails?: A Lot Help needed moving from lying on your back to sitting on the side of a flat bed without using bedrails?: A Lot Help needed moving to and from a bed to a chair (including a wheelchair)?: A Lot Help needed standing up from a chair using your arms (e.g., wheelchair or bedside chair)?: A Lot Help needed to walk in hospital room?: A Lot Help needed climbing 3-5 steps with a railing? : Total 6 Click Score: 11    End of Session Equipment Utilized During Treatment: Gait belt Activity Tolerance: Patient limited by fatigue Patient left: in chair;with call bell/phone within reach;with chair alarm set;with nursing/sitter in room Nurse Communication: Mobility status PT Visit Diagnosis: Difficulty in walking, not elsewhere classified (R26.2);Muscle weakness (generalized) (M62.81);Unsteadiness on feet (R26.81)     Time: 1015-1040 PT Time Calculation (min) (ACUTE ONLY): 25 min  Charges:  $Gait Training: 8-22 mins $Therapeutic Activity: 8-22 mins                     Nyzaiah Kai, PT, GCS 02/07/20,10:49 AM

## 2020-02-08 LAB — BASIC METABOLIC PANEL
Anion gap: 10 (ref 5–15)
BUN: 20 mg/dL (ref 8–23)
CO2: 24 mmol/L (ref 22–32)
Calcium: 8.6 mg/dL — ABNORMAL LOW (ref 8.9–10.3)
Chloride: 102 mmol/L (ref 98–111)
Creatinine, Ser: 0.95 mg/dL (ref 0.61–1.24)
GFR, Estimated: >60 mL/min (ref >60)
Glucose, Bld: 107 mg/dL — ABNORMAL HIGH (ref 70–99)
Potassium: 4.6 mmol/L (ref 3.5–5.1)
Sodium: 136 mmol/L (ref 135–145)

## 2020-02-08 LAB — CBC
HCT: 39.9 % (ref 39.0–52.0)
Hemoglobin: 12.7 g/dL — ABNORMAL LOW (ref 13.0–17.0)
MCH: 26.1 pg (ref 26.0–34.0)
MCHC: 31.8 g/dL (ref 30.0–36.0)
MCV: 81.9 fL (ref 80.0–100.0)
Platelets: 250 10*3/uL (ref 150–400)
RBC: 4.87 MIL/uL (ref 4.22–5.81)
RDW: 18.1 % — ABNORMAL HIGH (ref 11.5–15.5)
WBC: 10.4 10*3/uL (ref 4.0–10.5)
nRBC: 0 % (ref 0.0–0.2)

## 2020-02-08 NOTE — Progress Notes (Signed)
PROGRESS NOTE    [name] [name]  QMG:[id number] DOB: 30-Jan-1941 DOA: 01/02/2020 PCP: [name], [name], MD    Brief Narrative:  80 year old gentleman admitted with confusion and difficulty breathing and found to have right-sided loculated pleural effusion with empyema.  Treatment completed.  Remains in the hospital as he is unable to go to SNF.   Assessment & Plan:   Active Problems:   Empyema lung (HCC)   Severe sepsis without septic shock ([place])   History of CVA in adulthood   Physical deconditioning   Dementia associated with other underlying disease without behavioral disturbance ([place])  Empyema with right lung Severe sepsis without septic shock, resolved History of CVA Advanced physical deconditioning  Plan: Currently on oral Augmentin.  Unsafe disposition.  Continue hospital level of care.  Transfer to skilled level of care whenever bed is available.   DVT prophylaxis: enoxaparin (LOVENOX) injection 40 mg Start: 01/03/20 1000   Code Status: DNR Family Communication: None Disposition Plan: Status is: Inpatient  Remains inpatient appropriate because:Unsafe d/c plan   Dispo:  Patient From: Home  Planned Disposition: [place]  Expected discharge date: 02/10/2020  Medically stable for discharge: Yes          Consultants:   None  Procedures:   Multiple, refer to procedure  Antimicrobials:  Antibiotics Given (last 72 hours)    Date/Time Action Medication Dose   02/05/20 1223 Given   metroNIDAZOLE (FLAGYL) tablet 500 mg 500 mg   02/05/20 1449 Given   amoxicillin-clavulanate (AUGMENTIN) 875-125 MG per tablet 1 tablet 1 tablet   02/05/20 2150 Given   amoxicillin-clavulanate (AUGMENTIN) 875-125 MG per tablet 1 tablet 1 tablet   02/06/20 0936 Given   amoxicillin-clavulanate (AUGMENTIN) 875-125 MG per tablet 1 tablet 1 tablet   02/06/20 2005 Given   amoxicillin-clavulanate (AUGMENTIN) 875-125 MG per tablet 1 tablet 1 tablet    02/07/20 1114 Given   amoxicillin-clavulanate (AUGMENTIN) 875-125 MG per tablet 1 tablet 1 tablet   02/07/20 2229 Given   amoxicillin-clavulanate (AUGMENTIN) 875-125 MG per tablet 1 tablet 1 tablet   02/08/20 1059 Given   amoxicillin-clavulanate (AUGMENTIN) 875-125 MG per tablet 1 tablet 1 tablet         Subjective: Patient seen and examined.  No overnight events.  Remains on room air.  Nurses reported no events.  Objective: Vitals:   02/07/20 1457 02/07/20 1534 02/07/20 1947 02/08/20 0427  BP: 125/79 123/75 125/78 101/66  Pulse: 66 67 68 79  Resp: 16 16 17 16   Temp: 98.6 F (37 C) 98.2 F (36.8 C) 98.3 F (36.8 C) 98.2 F (36.8 C)  TempSrc: Oral Oral Oral Oral  SpO2: 100% 99% 99% 96%  Weight:      Height:        Intake/Output Summary (Last 24 hours) at 02/08/2020 1152 Last data filed at 02/08/2020 1040 Gross per 24 hour  Intake 200 ml  Output 1100 ml  Net -900 ml   Filed Weights   01/03/20 1414  Weight: 66.8 kg    Examination:  General exam: Looks comfortable.  Not in any distress. Respiratory system: Clear to auscultation. Respiratory effort normal.  On room air. Cardiovascular system: Regular.  No edema.   Gastrointestinal system: Abdomen is nondistended, soft and nontender. No organomegaly or masses felt. Normal bowel sounds heard.    Data Reviewed: I have personally reviewed following labs and imaging studies  CBC: Recent Labs  Lab 02/08/20 0117  WBC 10.4  HGB 12.7*  HCT 39.9  MCV 81.9  PLT 195   Basic Metabolic Panel: Recent Labs  Lab 02/08/20 0117  NA 136  K 4.6  CL 102  CO2 24  GLUCOSE 107*  BUN 20  CREATININE 0.95  CALCIUM 8.6*   GFR: Estimated Creatinine Clearance: 60.5 mL/min (by C-G formula based on SCr of 0.95 mg/dL). Liver Function Tests: No results for input(s): AST, ALT, ALKPHOS, BILITOT, PROT, ALBUMIN in the last 168 hours. No results for input(s): LIPASE, AMYLASE in the last 168 hours. No results for input(s): AMMONIA in  the last 168 hours. Coagulation Profile: No results for input(s): INR, PROTIME in the last 168 hours. Cardiac Enzymes: No results for input(s): CKTOTAL, CKMB, CKMBINDEX, TROPONINI in the last 168 hours. BNP (last 3 results) No results for input(s): PROBNP in the last 8760 hours. HbA1C: No results for input(s): HGBA1C in the last 72 hours. CBG: No results for input(s): GLUCAP in the last 168 hours. Lipid Profile: No results for input(s): CHOL, HDL, LDLCALC, TRIG, CHOLHDL, LDLDIRECT in the last 72 hours. Thyroid Function Tests: No results for input(s): TSH, T4TOTAL, FREET4, T3FREE, THYROIDAB in the last 72 hours. Anemia Panel: No results for input(s): VITAMINB12, FOLATE, FERRITIN, TIBC, IRON, RETICCTPCT in the last 72 hours. Sepsis Labs: No results for input(s): PROCALCITON, LATICACIDVEN in the last 168 hours.  No results found for this or any previous visit (from the past 240 hour(s)).       Radiology Studies: No results found.      Scheduled Meds: . amoxicillin-clavulanate  1 tablet Oral Q12H  . aspirin EC  81 mg Oral Daily  . clopidogrel  75 mg Oral Daily  . DULoxetine  60 mg Oral Daily  . enoxaparin (LOVENOX) injection  40 mg Subcutaneous Daily  . feeding supplement  237 mL Oral TID BM  . multivitamin with minerals  1 tablet Oral Daily  . rosuvastatin  10 mg Oral Daily   Continuous Infusions:   LOS: 37 days    Time spent: 15 minutes    Barb Merino, MD [facility] Pager [phone]

## 2020-02-08 NOTE — Plan of Care (Signed)
  Problem: Education: Goal: Knowledge of General Education information will improve Description: Including pain rating scale, medication(s)/side effects and non-pharmacologic comfort measures Outcome: Progressing   Problem: Clinical Measurements: Goal: Ability to maintain clinical measurements within normal limits will improve Outcome: Progressing Goal: Will remain free from infection Outcome: Progressing   Problem: Pain Managment: Goal: General experience of comfort will improve Outcome: Progressing   Problem: Skin Integrity: Goal: Risk for impaired skin integrity will decrease Outcome: Progressing   Problem: Education: Goal: Knowledge of General Education information will improve Description: Including pain rating scale, medication(s)/side effects and non-pharmacologic comfort measures Outcome: Progressing

## 2020-02-09 NOTE — Plan of Care (Signed)
  Problem: Pain Managment: Goal: General experience of comfort will improve Outcome: Progressing   Problem: Skin Integrity: Goal: Risk for impaired skin integrity will decrease Outcome: Progressing   Problem: Fluid Volume: Goal: Hemodynamic stability will improve Outcome: Progressing   Problem: Clinical Measurements: Goal: Diagnostic test results will improve Outcome: Progressing Goal: Signs and symptoms of infection will decrease Outcome: Progressing   Problem: Respiratory: Goal: Ability to maintain adequate ventilation will improve Outcome: Progressing   Problem: Clinical Measurements: Goal: Will remain free from infection Outcome: Progressing   Problem: Nutrition: Goal: Adequate nutrition will be maintained Outcome: Progressing   Problem: Coping: Goal: Level of anxiety will decrease Outcome: Progressing   Problem: Pain Managment: Goal: General experience of comfort will improve Outcome: Progressing   Problem: Safety: Goal: Ability to remain free from injury will improve Outcome: Progressing   Problem: Skin Integrity: Goal: Risk for impaired skin integrity will decrease Outcome: Progressing

## 2020-02-09 NOTE — TOC Progression Note (Signed)
Transition of Care [facility]) - Progression Note    Patient Details  Name: [name] [name] MRN: [id number] Date of Birth: 14-Mar-1940  Transition of Care [facility]) CM/SW [place], [place] Phone Number: [phone] 02/09/2020, 11:31 AM  Clinical Narrative:     CSW checked referral hub and [facility] referral still pending.  TOC team will continue to assist with discharge planning needs.    Barriers to Discharge: Continued Medical Work up,SNF Pending bed offer  Expected Discharge Plan and Services         Living arrangements for the past 2 months: [place]                                       Social Determinants of Health (SDOH) Interventions    Readmission Risk Interventions No flowsheet data found.

## 2020-02-09 NOTE — Progress Notes (Signed)
PROGRESS NOTE    [name] [name]  WJX:[id number] DOB: December 12, 1940 DOA: 01/02/2020 PCP: [name], [name], MD    Brief Narrative:  80 year old gentleman admitted with confusion and difficulty breathing and found to have right-sided loculated pleural effusion with empyema.  Treatment completed.  Remains in the hospital as he is unable to go to SNF.   Assessment & Plan:   Active Problems:   Empyema lung (HCC)   Severe sepsis without septic shock ([place])   History of CVA in adulthood   Physical deconditioning   Dementia associated with other underlying disease without behavioral disturbance ([place])  Empyema with right lung Severe sepsis without septic shock, resolved History of CVA and left hemiparesis. Advanced physical deconditioning  Plan: Currently on oral Augmentin.  Unsafe disposition.  Continue hospital level of care.  Transfer to skilled level of care whenever bed is available.   DVT prophylaxis: enoxaparin (LOVENOX) injection 40 mg Start: 01/03/20 1000   Code Status: DNR Family Communication: None Disposition Plan: Status is: Inpatient  Remains inpatient appropriate because:Unsafe d/c plan   Dispo:  Patient From: Home  Planned Disposition: [place]  Expected discharge date: 02/10/2020  Medically stable for discharge: Yes          Consultants:   None  Procedures:   Multiple, refer to procedure  Antimicrobials:  Antibiotics Given (last 72 hours)    Date/Time Action Medication Dose   02/06/20 2005 Given   amoxicillin-clavulanate (AUGMENTIN) 875-125 MG per tablet 1 tablet 1 tablet   02/07/20 1114 Given   amoxicillin-clavulanate (AUGMENTIN) 875-125 MG per tablet 1 tablet 1 tablet   02/07/20 2229 Given   amoxicillin-clavulanate (AUGMENTIN) 875-125 MG per tablet 1 tablet 1 tablet   02/08/20 1059 Given   amoxicillin-clavulanate (AUGMENTIN) 875-125 MG per tablet 1 tablet 1 tablet   02/08/20 2251 Given   amoxicillin-clavulanate  (AUGMENTIN) 875-125 MG per tablet 1 tablet 1 tablet   02/09/20 1003 Given   amoxicillin-clavulanate (AUGMENTIN) 875-125 MG per tablet 1 tablet 1 tablet         Subjective: Patient seen and examined.  No overnight events.  Remains on room air.  Nurses reported no events. Patient tells me that he lives with his stepson but he is not available all the time so not able to go home.  Objective: Vitals:   02/08/20 0427 02/08/20 1456 02/08/20 2205 02/09/20 0500  BP: 101/66 101/71 121/85 125/72  Pulse: 79 83 79 72  Resp: 16 18 16 16   Temp: 98.2 F (36.8 C) 98.2 F (36.8 C) 98.4 F (36.9 C) 98.4 F (36.9 C)  TempSrc: Oral Oral Oral Oral  SpO2: 96% 99% 97% 100%  Weight:      Height:        Intake/Output Summary (Last 24 hours) at 02/09/2020 1100 Last data filed at 02/08/2020 2312 Gross per 24 hour  Intake 120 ml  Output 550 ml  Net -430 ml   Filed Weights   01/03/20 1414  Weight: 66.8 kg    Examination:  General exam: Looks comfortable.  Not in any distress. Respiratory system: Clear to auscultation. Respiratory effort normal.  On room air. Cardiovascular system: Regular.  No edema.   Gastrointestinal system: Abdomen is nondistended, soft and nontender. No organomegaly or masses felt. Normal bowel sounds heard.    Data Reviewed: I have personally reviewed following labs and imaging studies  CBC: Recent Labs  Lab 02/08/20 0117  WBC 10.4  HGB 12.7*  HCT 39.9  MCV 81.9  PLT  409   Basic Metabolic Panel: Recent Labs  Lab 02/08/20 0117  NA 136  K 4.6  CL 102  CO2 24  GLUCOSE 107*  BUN 20  CREATININE 0.95  CALCIUM 8.6*   GFR: Estimated Creatinine Clearance: 60.5 mL/min (by C-G formula based on SCr of 0.95 mg/dL). Liver Function Tests: No results for input(s): AST, ALT, ALKPHOS, BILITOT, PROT, ALBUMIN in the last 168 hours. No results for input(s): LIPASE, AMYLASE in the last 168 hours. No results for input(s): AMMONIA in the last 168 hours. Coagulation  Profile: No results for input(s): INR, PROTIME in the last 168 hours. Cardiac Enzymes: No results for input(s): CKTOTAL, CKMB, CKMBINDEX, TROPONINI in the last 168 hours. BNP (last 3 results) No results for input(s): PROBNP in the last 8760 hours. HbA1C: No results for input(s): HGBA1C in the last 72 hours. CBG: No results for input(s): GLUCAP in the last 168 hours. Lipid Profile: No results for input(s): CHOL, HDL, LDLCALC, TRIG, CHOLHDL, LDLDIRECT in the last 72 hours. Thyroid Function Tests: No results for input(s): TSH, T4TOTAL, FREET4, T3FREE, THYROIDAB in the last 72 hours. Anemia Panel: No results for input(s): VITAMINB12, FOLATE, FERRITIN, TIBC, IRON, RETICCTPCT in the last 72 hours. Sepsis Labs: No results for input(s): PROCALCITON, LATICACIDVEN in the last 168 hours.  No results found for this or any previous visit (from the past 240 hour(s)).       Radiology Studies: No results found.      Scheduled Meds: . amoxicillin-clavulanate  1 tablet Oral Q12H  . aspirin EC  81 mg Oral Daily  . clopidogrel  75 mg Oral Daily  . DULoxetine  60 mg Oral Daily  . enoxaparin (LOVENOX) injection  40 mg Subcutaneous Daily  . feeding supplement  237 mL Oral TID BM  . multivitamin with minerals  1 tablet Oral Daily  . rosuvastatin  10 mg Oral Daily   Continuous Infusions:   LOS: 38 days    Time spent: 15 minutes    Barb Merino, MD [facility] Pager [phone]

## 2020-02-10 DIAGNOSIS — R5381 Other malaise: Secondary | ICD-10-CM | POA: Diagnosis not present

## 2020-02-10 DIAGNOSIS — J869 Pyothorax without fistula: Secondary | ICD-10-CM | POA: Diagnosis not present

## 2020-02-10 DIAGNOSIS — F0280 Dementia in other diseases classified elsewhere without behavioral disturbance: Secondary | ICD-10-CM | POA: Diagnosis not present

## 2020-02-10 DIAGNOSIS — Z8673 Personal history of transient ischemic attack (TIA), and cerebral infarction without residual deficits: Secondary | ICD-10-CM | POA: Diagnosis not present

## 2020-02-10 MED ORDER — COVID-19 MRNA VACCINE (PFIZER) 30 MCG/0.3ML IM SUSP
0.3000 mL | Freq: Once | INTRAMUSCULAR | Status: AC
  Administered 2020-02-10: 0.3 mL via INTRAMUSCULAR
  Filled 2020-02-10: qty 0.3

## 2020-02-10 NOTE — Progress Notes (Signed)
[facility] PROGRESS NOTE  [name] [name] MGQ:[id number] DOB: Aug 24, 1940 DOA: 01/02/2020 PCP: [name], [name], MD  Status: Remains inpatient appropriate because:Unsafe d/c plan   Dispo:  Patient From: HomePrivate residence  Planned Disposition: Skilled Nursing FacilitySNF  Expected discharge date: 02/11/2020  Medically stable for discharge: Yes barriers to discharge: Needs SNF placement but Medicaid application in process and is pending.  Patient's son [name] will be assisting with this process; patient sister has also been assisting -CSW attempted to contact son on 1/03 with no response.  As of 1/10 information has been submitted to [facility] with new [place] 2   Code Status: DNR Family Communication: Son [name] 1/07 DVT prophylaxis: Lovenox Vaccination status: 1/10 patient will be given first dose of Pfizer COVID-vaccine  Foley catheter:  No   HPI: 80 year old male with history of recent CVA in 11/2019 with residual dysarthria, hypertension, hyperlipidemia and spinal stenosis, initially presented to [facility] from [facility] SNF with confusion and difficulty breathing. Work-up revealed right-sided loculated pleural effusion with concern for empyema. IR at [facility] attempted ultrasound-guided chest tube placement but were unable due to thickness of pleural fluid. He was evaluated by general surgery at [facility] who recommended transfer to [facility] for thoracic surgery evaluation. Patient was evaluated by thoracic surgery who recommended chest tube placement by IR, that was performed on 01/03/2020. ID consulted and recommended transitioning from IV Zosyn to IV ceftriaxone and oral Flagyl while in-house and discharge on p.o. Augmentin for a total of 6 weeks with start date of 01/03/2020. Right-sided chest tube removed 12/16. TCTS signed off 12/17 and advised no outpatient follow-up needed.  Patient stable for discharge.  PT/OT recommending SNF.  Patient will need long-term  Medicaid in place before can discharge.  Subjective: Awaken from sleep.  Had eaten significant amount of breakfast tray.  No specific complaints verbalized.  Objective: Vitals:   02/09/20 2048 02/10/20 0547  BP: 133/78 132/79  Pulse: 66 75  Resp: 16 16  Temp: 98.4 F (36.9 C) 97.6 F (36.4 C)  SpO2: 99% 100%    Intake/Output Summary (Last 24 hours) at 02/10/2020 1142 Last data filed at 02/10/2020 1138 Gross per 24 hour  Intake 420 ml  Output 1800 ml  Net -1380 ml   Filed Weights   01/03/20 1414  Weight: 66.8 kg    Exam: Constitutional: Awakened,, persistent flat affect Respiratory: Anterior lung sounds clear to auscultation, stable on room air Cardiovascular: Heart sounds S1-S2 without any murmurs auscultated, pulses regular nontachycardic, no peripheral edema on exam Abdomen: Bowel sounds present, abdomen soft nontender nondistended normoactive bowel sounds.  Tolerating solid diet without difficulty.  LBM 1/09 Neurologic: CN 2-12 grossly intact. Sensation intact,  Strength 5/5 x on left slightly weaker 4/5 on the right Psychiatric: alert and oriented x name only. Flat affect   Assessment/Plan: Acute problems: Acute hypoxemic respiratory failure  2/2 right-sided parapneumonic effusion, empyema  **SEPSIS RULED OUT** -Hypoxemia (resolved)-TCTS consulted -s/p pigtail chest tube on 01/03/2020 and was removed on 12/16. -Follow-up chest x-ray 12/17 without pneumothorax.   -Thoracic surgery signed off 12/17, prn OP follow-up recommend  -Continue antibiotics (currently on ceftriaxone and metronidazole initially was treated with IV Zosyn beginning on 12/3) for a total of 6 weeks with expected last dose 02/17/2020.  1/5: discussed with ID- transitioned to Augmentin  Physical deconditioning/gait disturbance -PT and OT recommending SNF. -Evaluated today with documentation that he is very motivated but did asked to go back to bed after walking.  Encourage more frequent out  of bed so he  will build endurance.  Patient finally encouraged to stay up in chair. -PT is also documented slow shuffling steps while mobilizing  Hypertension -Controlled off antihypertensive medication  Severe protein calorie malnutrition Nutrition Problem: Severe Malnutrition Etiology: chronic illness (CVA) Signs/Symptoms: moderate fat depletion,severe fat depletion,moderate muscle depletion,severe muscle depletion Interventions: Ensure Enlive (each supplement provides 350kcal and 20 grams of protein),Magic cup,MVI -As of 12/23 patient able to feed self and eating meals well (between 50% and 100% of meals over the past 24 hours) Estimated body mass index is 21.13 kg/m as calculated from the following:   Height as of this encounter: 5\' 10"  (1.778 m).   Weight as of this encounter: 66.8 kg.   Wounds: Incision (Closed) 01/03/20 (Active)  Date First Assessed/Time First Assessed: 01/03/20 2000      Assessments 01/03/2020  8:00 PM 02/06/2020  8:00 PM  Dressing Type - None  Dressing Dry;Clean;Intact Clean;Dry;Intact  Dressing Change Frequency - PRN  Site / Wound Assessment Dressing in place / Unable to assess Dry;Clean  Drainage Description Serosanguineous -     No Linked orders to display     Other problems: Acute kidney injury -Resolved -Current creatinine 1.01 with a GFR greater than 60  History of CVA/dementia without behavioral disturbances -Remains normotensive off of BP medication -Continue Plavix and low-dose aspirin -Continue statin/Crestor -Continue Cymbalta  Anemia of chronic disease -Hemoglobin stable around 10   Data Reviewed: Basic Metabolic Panel: Recent Labs  Lab 02/08/20 0117  NA 136  K 4.6  CL 102  CO2 24  GLUCOSE 107*  BUN 20  CREATININE 0.95  CALCIUM 8.6*   Liver Function Tests: No results for input(s): AST, ALT, ALKPHOS, BILITOT, PROT, ALBUMIN in the last 168 hours. No results for input(s): LIPASE, AMYLASE in the last 168 hours. No results for input(s):  AMMONIA in the last 168 hours. CBC: Recent Labs  Lab 02/08/20 0117  WBC 10.4  HGB 12.7*  HCT 39.9  MCV 81.9  PLT 250   Cardiac Enzymes: No results for input(s): CKTOTAL, CKMB, CKMBINDEX, TROPONINI in the last 168 hours. BNP (last 3 results) Recent Labs    12/26/19 1510 01/01/20 1018  BNP 88.7 177.1*    ProBNP (last 3 results) No results for input(s): PROBNP in the last 8760 hours.  CBG: No results for input(s): GLUCAP in the last 168 hours.  No results found for this or any previous visit (from the past 240 hour(s)).   Studies: No results found.  Scheduled Meds: . amoxicillin-clavulanate  1 tablet Oral Q12H  . aspirin EC  81 mg Oral Daily  . clopidogrel  75 mg Oral Daily  . DULoxetine  60 mg Oral Daily  . enoxaparin (LOVENOX) injection  40 mg Subcutaneous Daily  . feeding supplement  237 mL Oral TID BM  . multivitamin with minerals  1 tablet Oral Daily  . rosuvastatin  10 mg Oral Daily   Continuous Infusions:   Active Problems:   Empyema lung (HCC)   Severe sepsis without septic shock ([place])   History of CVA in adulthood   Physical deconditioning   Dementia associated with other underlying disease without behavioral disturbance [facility])   Consultants:  TCTS  Conventional radiology  Procedures:  Chest tube placement 12/3 by IR  Antibiotics: Anti-infectives (From admission, onward)   Start     Dose/Rate Route Frequency Ordered Stop   02/05/20 1430  amoxicillin-clavulanate (AUGMENTIN) 875-125 MG per tablet 1 tablet  1 tablet Oral Every 12 hours 02/05/20 1338 02/17/20 0959   01/12/20 1400  metroNIDAZOLE (FLAGYL) tablet 500 mg  Status:  Discontinued        500 mg Oral Every 8 hours 01/12/20 1111 02/05/20 1337   01/12/20 1300  cefTRIAXone (ROCEPHIN) 2 g in sodium chloride 0.9 % 100 mL IVPB  Status:  Discontinued        2 g 200 mL/hr over 30 Minutes Intravenous Every 24 hours 01/12/20 1111 02/05/20 1337   01/02/20 2330  piperacillin-tazobactam  (ZOSYN) IVPB 3.375 g  Status:  Discontinued        3.375 g 12.5 mL/hr over 240 Minutes Intravenous Every 8 hours 01/02/20 2242 01/12/20 1111       Time spent: 20 minutes    Erin Hearing ANP  [facility] 7 am- 330 pm/M-F for direct patient care and secure chat Please refer to Amion for contact information 39 days

## 2020-02-10 NOTE — TOC Progression Note (Signed)
Transition of Care [facility]) - Progression Note    Patient Details  Name: Helmer Dull MRN: [id number] Date of Birth: [birth year]  Transition [facility] CM/SW [place], RN Phone Number: 02/10/2020, 3:03 PM  Clinical Narrative:    Case management spoke to Winnifred Friar, CM at [facility] and the DON at the nursing facility is currently reviewing patient's clinicals for possible readmission to the facility at this time.  Winnifred Friar was updated with financial information to include that the patient's family had applied for Medicaid for the patient and are currently waiting on application approval.  The patient is currently in co-pay days for [facility] at this point.   The patient received his first COVID vaccine today.  CM and CSW will continue to follow the patient for SNF admission since patient is needing long term care placement.   Expected Discharge Plan: Long Term Nursing Home Barriers to Discharge:  (Waiting on approval for [place] Medicaid)  Expected Discharge Plan and Services Expected Discharge Plan: [place]   Discharge Planning Services: CM Consult Post Acute Care Choice: [place] Living arrangements for the past 2 months: [place]                                       Social Determinants of Health (SDOH) Interventions    Readmission Risk Interventions No flowsheet data found.

## 2020-02-10 NOTE — Plan of Care (Signed)
  Problem: Education: Goal: Knowledge of General Education information will improve Description: Including pain rating scale, medication(s)/side effects and non-pharmacologic comfort measures Outcome: Progressing   Problem: Clinical Measurements: Goal: Ability to maintain clinical measurements within normal limits will improve Outcome: Progressing Goal: Will remain free from infection Outcome: Progressing Goal: Diagnostic test results will improve Outcome: Progressing Goal: Respiratory complications will improve Outcome: Progressing Goal: Cardiovascular complication will be avoided Outcome: Progressing   Problem: Pain Managment: Goal: General experience of comfort will improve Outcome: Progressing   Problem: Skin Integrity: Goal: Risk for impaired skin integrity will decrease Outcome: Progressing   Problem: Fluid Volume: Goal: Hemodynamic stability will improve Outcome: Progressing   Problem: Clinical Measurements: Goal: Diagnostic test results will improve Outcome: Progressing Goal: Signs and symptoms of infection will decrease Outcome: Progressing   Problem: Respiratory: Goal: Ability to maintain adequate ventilation will improve Outcome: Progressing   Problem: Education: Goal: Knowledge of General Education information will improve Description: Including pain rating scale, medication(s)/side effects and non-pharmacologic comfort measures Outcome: Progressing   Problem: Health Behavior/Discharge Planning: Goal: Ability to manage health-related needs will improve Outcome: Progressing   Problem: Clinical Measurements: Goal: Ability to maintain clinical measurements within normal limits will improve Outcome: Progressing Goal: Will remain free from infection Outcome: Progressing Goal: Diagnostic test results will improve Outcome: Progressing Goal: Respiratory complications will improve Outcome: Progressing Goal: Cardiovascular complication will be  avoided Outcome: Progressing   Problem: Activity: Goal: Risk for activity intolerance will decrease Outcome: Progressing   Problem: Nutrition: Goal: Adequate nutrition will be maintained Outcome: Progressing   Problem: Coping: Goal: Level of anxiety will decrease Outcome: Progressing   Problem: Elimination: Goal: Will not experience complications related to bowel motility Outcome: Progressing Goal: Will not experience complications related to urinary retention Outcome: Progressing   Problem: Pain Managment: Goal: General experience of comfort will improve Outcome: Progressing   Problem: Safety: Goal: Ability to remain free from injury will improve Outcome: Progressing   Problem: Skin Integrity: Goal: Risk for impaired skin integrity will decrease Outcome: Progressing

## 2020-02-10 NOTE — NC FL2 (Signed)
[place] LEVEL OF CARE SCREENING TOOL     IDENTIFICATION  Patient Name: [name] [name] Birthdate: October 11, 1940 Sex: male Admission Date (Current Location): 01/02/2020  [facility] [place] Number:  Herbalist and Address:  [facility] [place] [address], [place], [place] [address]      Provider Number: [id number]  Attending Physician Name and Address:  Barb Merino, MD  Relative Name and Phone Number:  Patient's sister, [name] [phone]    Current Level of Care: Hospital Recommended Level of Care: [place] Prior Approval Number:    Date Approved/Denied: 09/20/19 PASRR Number: [id number]  Discharge Plan: SNF    Current Diagnoses: Patient Active Problem List   Diagnosis Date Noted  . Dementia associated with other underlying disease without behavioral disturbance ([place])   . History of CVA in adulthood   . Physical deconditioning   . Severe sepsis without septic shock ([place])   . Empyema lung ([place]) 01/02/2020  . Malnutrition of moderate degree 12/28/2019  . Lobar pneumonia ([place]) 12/26/2019  . Acute hypoxemic respiratory failure ([place]) 12/26/2019  . Dysphagia 12/26/2019  . Acute encephalopathy 12/26/2019  . CVA (cerebral vascular accident) ([place]) 11/03/2019  . AMS (altered mental status) 11/02/2019  . Acute lower UTI 11/02/2019  . Weakness 09/20/2019  . Protein-calorie malnutrition, severe 09/20/2019  . AKI (acute kidney injury) ([place]) 09/19/2019  . Ambulatory dysfunction 09/19/2019  . Dehydration 09/19/2019  . Fall at home, initial encounter 09/19/2019  . Aortic atherosclerosis ([place]) 02/16/2019  . Hypertension, essential, benign 08/13/2018  . Urinary frequency 02/05/2018  . PAD (peripheral artery disease) (HCC)-Mild 12/26/2017  . Chronic pain disorder 11/17/2017  . Unstable gait 11/17/2017  . BPH associated with nocturia 09/26/2017  . Allergic rhinitis 06/09/2017  . Tobacco use disorder  02/14/2017  . Generalized osteoarthritis of multiple sites 02/14/2017  . Bilateral lower extremity edema 08/12/2016  . Chronic right-sided low back pain with right-sided sciatica 03/14/2016  . Chronic pain of right knee 03/14/2016  . DDD (degenerative disc disease), lumbar 07/07/2014  . DDD (degenerative disc disease), cervical 07/07/2014  . DJD (degenerative joint disease) of knee 07/07/2014  . Bilateral occipital neuralgia 07/07/2014  . Spinal stenosis, lumbar region, with neurogenic claudication 07/07/2014    Orientation RESPIRATION BLADDER Height & Weight     Self,Place  O2 ([place] 3L/min) External catheter Weight: 147 lb 4.3 oz (66.8 kg) Height:  5\' 10"  (177.8 cm)  BEHAVIORAL SYMPTOMS/MOOD NEUROLOGICAL BOWEL NUTRITION STATUS      Incontinent Diet (see d/c summary)  AMBULATORY STATUS COMMUNICATION OF NEEDS Skin   Extensive Assist Verbally Normal                       Personal Care Assistance Level of Assistance  Bathing,Feeding,Dressing Bathing Assistance: Maximum assistance Feeding assistance: Maximum assistance Dressing Assistance: Maximum assistance     Functional Limitations Info  Sight,Hearing Sight Info: Adequate Hearing Info: Adequate Speech Info: Impaired    SPECIAL CARE FACTORS FREQUENCY  PT (By licensed PT),OT (By licensed OT)     PT Frequency: 5x/week OT Frequency: 5x/week            Contractures      Additional Factors Info  Code Status,Allergies Code Status Info: Full Code Allergies Info: No Known allergies           Current Medications (02/10/2020):  This is the current hospital active medication list Current Facility-Administered Medications  Medication Dose Route Frequency Provider Last  Rate Last Admin  . acetaminophen (TYLENOL) tablet 650 mg  650 mg Oral Q6H PRN Dessa Phi, DO   650 mg at 02/06/20 2005  . amoxicillin-clavulanate (AUGMENTIN) 875-125 MG per tablet 1 tablet  1 tablet Oral Q12H Samella Parr, NP   1 tablet at  02/10/20 0929  . aspirin EC tablet 81 mg  81 mg Oral Daily Irene Pap N, DO   81 mg at 02/10/20 4098  . clopidogrel (PLAVIX) tablet 75 mg  75 mg Oral Daily Irene Pap N, DO   75 mg at 02/10/20 1191  . COVID-19 mRNA vaccine (Pfizer) injection 0.3 mL  0.3 mL Intramuscular Once Samella Parr, NP      . DULoxetine (CYMBALTA) DR capsule 60 mg  60 mg Oral Daily Irene Pap N, DO   60 mg at 02/10/20 4782  . enoxaparin (LOVENOX) injection 40 mg  40 mg Subcutaneous Daily Irene Pap N, DO   40 mg at 02/10/20 9562  . feeding supplement (ENSURE ENLIVE / ENSURE PLUS) liquid 237 mL  237 mL Oral TID BM Dessa Phi, DO   237 mL at 02/09/20 2049  . multivitamin with minerals tablet 1 tablet  1 tablet Oral Daily Dessa Phi, DO   1 tablet at 02/10/20 0929  . ondansetron (ZOFRAN) injection 4 mg  4 mg Intravenous Q6H PRN Irene Pap N, DO      . rosuvastatin (CRESTOR) tablet 10 mg  10 mg Oral Daily Irene Pap N, DO   10 mg at 02/10/20 1308     Discharge Medications: Please see discharge summary for a list of discharge medications.  Relevant Imaging Results:  Relevant Lab Results:   Additional Information #[id number]  Archie Endo, LCSW

## 2020-02-11 DIAGNOSIS — Z8673 Personal history of transient ischemic attack (TIA), and cerebral infarction without residual deficits: Secondary | ICD-10-CM | POA: Diagnosis not present

## 2020-02-11 DIAGNOSIS — J869 Pyothorax without fistula: Secondary | ICD-10-CM | POA: Diagnosis not present

## 2020-02-11 DIAGNOSIS — F0280 Dementia in other diseases classified elsewhere without behavioral disturbance: Secondary | ICD-10-CM | POA: Diagnosis not present

## 2020-02-11 DIAGNOSIS — R5381 Other malaise: Secondary | ICD-10-CM | POA: Diagnosis not present

## 2020-02-11 NOTE — Progress Notes (Signed)
Physical Therapy Treatment Patient Details Name: [name] [name] MRN: [id number] DOB: [birth year] Today's Date: 02/11/2020    History of Present Illness 80 year old male with history of recent CVA in 11/2019 with residual dysarthria, hypertension, hyperlipidemia and spinal stenosis, initially presented to [facility] from [facility] SNF with confusion and difficulty breathing.  Work-up revealed right-sided loculated pleural effusion with concern for empyema. Transfer to [facility]. Patient was evaluated by thoracic surgery who recommended chest tube placement by IR, that was performed on 01/03/2020.  ID consulted and recommended transitioning from IV Zosyn to IV ceftriaxone and oral Flagyl while in-house and discharge on p.o. Augmentin for a total of 6 weeks with start date of 01/03/2020. Right-sided chest tube removed 12/16.    PT Comments    Pt supine on arrival, agreeable to therapy session and with good participation and tolerance for mobility. Pt noted to be incontinent of bowels once seated EOB, briefs donned for mobility, NT notified pt will likely need bath and sheets removed/bed wiped. Pt performed transfers and bed mobility with modA but needs only minA for gait trial in room 21ft using RW. Pt agreeable to sit up in chair to eat dinner at end of session, NT notified pt may need assistance to eat. Pt will continue to benefit from skilled rehab in a post acute setting to maximize functional gains before returning home.  Follow Up Recommendations  SNF     Equipment Recommendations  Rolling walker with 5" wheels;3in1 (PT)    Recommendations for Other Services       Precautions / Restrictions Precautions Precautions: Fall Precaution Comments: bladder and bowel incontinence Restrictions Weight Bearing Restrictions: No    Mobility  Bed Mobility Overal bed mobility: Needs Assistance Bed Mobility: Rolling;Sidelying to Sit Rolling: Min guard Sidelying to sit: Mod assist       General bed  mobility comments: patient uses rail to assist with supine to sit. Requires assist for trunk rise and to scoot hips to edge of bed.  Transfers Overall transfer level: Needs assistance Equipment used: Rolling walker (2 wheeled) Transfers: Sit to/from Stand Sit to Stand: Mod assist         General transfer comment: increased time and cues needed for hand placement. Patient found to be soiled when stood up, was able to stand while being cleaned up. Mod assist for standing from lowest bed height.  Ambulation/Gait Ambulation/Gait assistance: Min assist Gait Distance (Feet): 20 Feet Assistive device: Rolling walker (2 wheeled) Gait Pattern/deviations: Step-to pattern;Wide base of support;Shuffle;Decreased stride length Gait velocity: grossly <0.2 m/s Gait velocity interpretation: <1.31 ft/sec, indicative of household ambulator General Gait Details: requires extra time to clear and advance LLE, very slow cadence; fair use of RW, needs minA at most for RW placement and dense cues for step sequencing/longer strides; chair follow for safety   Stairs             Wheelchair Mobility    Modified Rankin (Stroke Patients Only)       Balance Overall balance assessment: Needs assistance Sitting-balance support: Feet supported Sitting balance-Leahy Scale: Fair     Standing balance support: Bilateral upper extremity supported;During functional activity Standing balance-Leahy Scale: Poor Standing balance comment: reliant on BUE support and external assist                            Cognition Arousal/Alertness: Awake/alert Behavior During Therapy: Flat affect Overall Cognitive Status: History of cognitive impairments - at baseline Area of  Impairment: Orientation;Attention;Memory;Following commands;Safety/judgement;Awareness;Problem solving                   Current Attention Level: Selective Memory: Decreased recall of precautions;Decreased short-term  memory Following Commands: Follows one step commands with increased time Safety/Judgement: Decreased awareness of safety;Decreased awareness of deficits Awareness: Intellectual Problem Solving: Slow processing;Requires verbal cues;Requires tactile cues;Difficulty sequencing General Comments: slow initiation and slow to process cues, participatory as able      Exercises General Exercises - Lower Extremity Ankle Circles/Pumps: AROM;Strengthening;Both;10 reps;Supine    General Comments General comments (skin integrity, edema, etc.): pt bed soiled, observed once to seated up EOB. pull-up briefs donned at EOB and sheets removed/bed bleach wiped. NT notified pt will need new sheets once bleach dries. Pt up in chair with dinner tray and may need assist to eat, NT notified.      Pertinent Vitals/Pain Pain Assessment: Faces Faces Pain Scale: Hurts a little bit Pain Location: R rib cage when donning gait belt Pain Descriptors / Indicators: Sore Pain Intervention(s): Monitored during session;Repositioned    Home Living                      Prior Function            PT Goals (current goals can now be found in the care plan section) Acute Rehab PT Goals Patient Stated Goal: to get up to recliner to eat dinner PT Goal Formulation: With patient/family Time For Goal Achievement: 02/19/20 Potential to Achieve Goals: Fair Progress towards PT goals: Progressing toward goals    Frequency    Min 2X/week      PT Plan Current plan remains appropriate    Co-evaluation              AM-PAC PT "6 Clicks" Mobility   Outcome Measure  Help needed turning from your back to your side while in a flat bed without using bedrails?: A Little Help needed moving from lying on your back to sitting on the side of a flat bed without using bedrails?: A Lot Help needed moving to and from a bed to a chair (including a wheelchair)?: A Little Help needed standing up from a chair using your  arms (e.g., wheelchair or bedside chair)?: A Lot Help needed to walk in hospital room?: A Little Help needed climbing 3-5 steps with a railing? : Total 6 Click Score: 14    End of Session Equipment Utilized During Treatment: Gait belt Activity Tolerance: Patient tolerated treatment well Patient left: in chair;with call bell/phone within reach;with chair alarm set Nurse Communication: Mobility status PT Visit Diagnosis: Difficulty in walking, not elsewhere classified (R26.2);Muscle weakness (generalized) (M62.81)     Time: 3664-4034 PT Time Calculation (min) (ACUTE ONLY): 24 min  Charges:  $Gait Training: 8-22 mins $Therapeutic Activity: 8-22 mins                     Edith Lord P., PTA Acute Rehabilitation Services Pager: [phone] Office: [place] 02/11/2020, 5:05 PM

## 2020-02-11 NOTE — Progress Notes (Addendum)
Nutrition Follow-up  DOCUMENTATION CODES:   Severe malnutrition in context of chronic illness  INTERVENTION:   -Continue Ensure Enlive po TID, each supplement provides 350 kcal and 20 grams of protein -Continue Magic cup TID with meals, each supplement provides 290 kcal and 9 grams of protein -Continue MVI with minerals daily  NUTRITION DIAGNOSIS:   Severe Malnutrition related to chronic illness (CVA) as evidenced by moderate fat depletion,severe fat depletion,moderate muscle depletion,severe muscle depletion.  Ongoing  GOAL:   Patient will meet greater than or equal to 90% of their needs  Progressing   MONITOR:   PO intake,Supplement acceptance,Labs,Weight trends,Skin,I & O's  REASON FOR ASSESSMENT:   Malnutrition Screening Tool    ASSESSMENT:   [name] [name] is a 80 y.o. male with medical history significant for CVA in October 2021 with residual dysarthria, essential hypertension, hyperlipidemia, spinal stenosis who initially presented at [facility] from [facility] SNF due to confusion and right-sided pneumonia.  12/3- s/pPlacement of a rightchest tube. 49F Thal. To pleural evac system. 12/16- chest tube removed  Reviewed I/O's: -1.2 L x 24 hours and -10 L since 01/28/20  UOP: 1.8 L x 24 hours  Pt continues to have good appetite. He is consuming 75-100% of meals. Per MAR, pt is also drinking Ensure supplements well.   Per TOC notes, pt continues to require long-term SNF placement. Medicare application pending.   Due to pt's medical stability and continues to consume meals and supplements well, RD will sign off. If further nutrition-related concerns are identified, please re-consult RD.  Labs reviewed.   Diet Order:   Diet Order            Diet regular Room service appropriate? Yes; Fluid consistency: Thin  Diet effective now                 EDUCATION NEEDS:   Education needs have been addressed  Skin:  Skin  Assessment: Skin Integrity Issues: Skin Integrity Issues:: Other (Comment) Other: chest tube removed on 01/16/20  Last BM:  02/11/20  Height:   Ht Readings from Last 1 Encounters:  01/03/20 5\' 10"  (1.778 m)    Weight:   Wt Readings from Last 1 Encounters:  01/03/20 66.8 kg    Ideal Body Weight:  74.5 kg  BMI:  Body mass index is 21.13 kg/m.  Estimated Nutritional Needs:   Kcal:  2150-2350  Protein:  120-135 grams  Fluid:  > 2 L    Loistine Chance, RD, LDN, [place] Registered Dietitian II Certified Diabetes Care and Education Specialist Please refer to [facility] for RD and/or RD on-call/weekend/after hours pager

## 2020-02-11 NOTE — Progress Notes (Signed)
[facility] PROGRESS NOTE  [name] [name] [id number] DOB: 11/30/1940 DOA: 01/02/2020 PCP: [name], [name], MD  Status: Remains inpatient appropriate because:Unsafe d/c plan   Dispo:  Patient From: HomePrivate residence  Planned Disposition: Skilled Nursing FacilitySNF  Expected discharge date: 02/11/2020  Medically stable for discharge: Yes barriers to discharge: Needs SNF placement but Medicaid application in process and is pending.  Patient's son [name] will be assisting with this process; patient sister has also been assisting -CSW attempted to contact son on 1/03 with no response.  As of 1/10 information has been submitted to [facility] with new [place] 2   Code Status: DNR Family Communication: Son [name] 1/07 DVT prophylaxis: Lovenox Vaccination status: 1/10 patient will be given first dose of Pfizer COVID-vaccine  Foley catheter:  No   HPI: 80 year old male with history of recent CVA in 11/2019 with residual dysarthria, hypertension, hyperlipidemia and spinal stenosis, initially presented to [facility] from [facility] SNF with confusion and difficulty breathing. Work-up revealed right-sided loculated pleural effusion with concern for empyema. IR at [facility] attempted ultrasound-guided chest tube placement but were unable due to thickness of pleural fluid. He was evaluated by general surgery at [facility] who recommended transfer to [facility] for thoracic surgery evaluation. Patient was evaluated by thoracic surgery who recommended chest tube placement by IR, that was performed on 01/03/2020. ID consulted and recommended transitioning from IV Zosyn to IV ceftriaxone and oral Flagyl while in-house and discharge on p.o. Augmentin for a total of 6 weeks with start date of 01/03/2020. Right-sided chest tube removed 12/16. TCTS signed off 12/17 and advised no outpatient follow-up needed.  Patient stable for discharge.  PT/OT recommending SNF.  Patient will need long-term  Medicaid in place before can discharge.  Subjective: Patient awakened.  Ready to get up and eat breakfast.  States he wants to sit in chair.  No specific complaints.  Objective: Vitals:   02/10/20 2023 02/11/20 0401  BP: (!) 144/89 122/68  Pulse: 71 65  Resp: 16 18  Temp: 98.9 F (37.2 C) 98.1 F (36.7 C)  SpO2: 99% 98%    Intake/Output Summary (Last 24 hours) at 02/11/2020 1245 Last data filed at 02/11/2020 1200 Gross per 24 hour  Intake 430 ml  Output 2600 ml  Net -2170 ml   Filed Weights   01/03/20 1414  Weight: 66.8 kg    Exam: Constitutional: Awakened,, persistent flat affect Respiratory: Anterior lung sounds clear to auscultation, stable on room air Cardiovascular: Heart sounds S1-S2 without any murmurs auscultated, pulses regular nontachycardic, no peripheral edema on exam Abdomen: Bowel sounds present, abdomen soft nontender nondistended normoactive bowel sounds.  Tolerating solid diet without difficulty.  LBM 1/09 Neurologic: CN 2-12 grossly intact. Sensation intact,  Strength 5/5 x on left slightly weaker 4/5 on the right Psychiatric: alert and oriented x name only. Flat affect   Assessment/Plan: Acute problems: Acute hypoxemic respiratory failure  2/2 right-sided parapneumonic effusion, empyema  **SEPSIS RULED OUT** -Hypoxemia (resolved)-TCTS consulted -s/p pigtail chest tube on 01/03/2020 and was removed on 12/16. -Follow-up chest x-ray 12/17 without pneumothorax.   -Thoracic surgery signed off 12/17, prn OP follow-up recommend  -Continue antibiotics (currently on ceftriaxone and metronidazole initially was treated with IV Zosyn beginning on 12/3) for a total of 6 weeks with expected last dose 02/17/2020.  1/5: discussed with ID- transitioned to Augmentin  Physical deconditioning/gait disturbance -PT and OT recommending SNF. -Evaluated today with documentation that he is very motivated but did asked to go back to bed  after walking.  Encourage more frequent out  of bed so he will build endurance.  Patient finally encouraged to stay up in chair. -PT is also documented slow shuffling steps while mobilizing  Hypertension -Controlled off antihypertensive medication  Severe protein calorie malnutrition Nutrition Problem: Severe Malnutrition Etiology: chronic illness (CVA) Signs/Symptoms: moderate fat depletion,severe fat depletion,moderate muscle depletion,severe muscle depletion Interventions: Ensure Enlive (each supplement provides 350kcal and 20 grams of protein),Magic cup,MVI -As of 12/23 patient able to feed self and eating meals well (between 50% and 100% of meals over the past 24 hours) Estimated body mass index is 21.13 kg/m as calculated from the following:   Height as of this encounter: 5\' 10"  (1.778 m).   Weight as of this encounter: 66.8 kg.   Wounds: Incision (Closed) 01/03/20 (Active)  Date First Assessed/Time First Assessed: 01/03/20 2000      Assessments 01/03/2020  8:00 PM 02/06/2020  8:00 PM  Dressing Type - None  Dressing Dry;Clean;Intact Clean;Dry;Intact  Dressing Change Frequency - PRN  Site / Wound Assessment Dressing in place / Unable to assess Dry;Clean  Drainage Description Serosanguineous -     No Linked orders to display     Other problems: Acute kidney injury -Resolved -Current creatinine 1.01 with a GFR greater than 60  History of CVA/dementia without behavioral disturbances -Remains normotensive off of BP medication -Continue Plavix and low-dose aspirin -Continue statin/Crestor -Continue Cymbalta  Anemia of chronic disease -Hemoglobin stable around 10   Data Reviewed: Basic Metabolic Panel: Recent Labs  Lab 02/08/20 0117  NA 136  K 4.6  CL 102  CO2 24  GLUCOSE 107*  BUN 20  CREATININE 0.95  CALCIUM 8.6*   Liver Function Tests: No results for input(s): AST, ALT, ALKPHOS, BILITOT, PROT, ALBUMIN in the last 168 hours. No results for input(s): LIPASE, AMYLASE in the last 168 hours. No results  for input(s): AMMONIA in the last 168 hours. CBC: Recent Labs  Lab 02/08/20 0117  WBC 10.4  HGB 12.7*  HCT 39.9  MCV 81.9  PLT 250   Cardiac Enzymes: No results for input(s): CKTOTAL, CKMB, CKMBINDEX, TROPONINI in the last 168 hours. BNP (last 3 results) Recent Labs    12/26/19 1510 01/01/20 1018  BNP 88.7 177.1*    ProBNP (last 3 results) No results for input(s): PROBNP in the last 8760 hours.  CBG: No results for input(s): GLUCAP in the last 168 hours.  No results found for this or any previous visit (from the past 240 hour(s)).   Studies: No results found.  Scheduled Meds: . amoxicillin-clavulanate  1 tablet Oral Q12H  . aspirin EC  81 mg Oral Daily  . clopidogrel  75 mg Oral Daily  . DULoxetine  60 mg Oral Daily  . enoxaparin (LOVENOX) injection  40 mg Subcutaneous Daily  . feeding supplement  237 mL Oral TID BM  . multivitamin with minerals  1 tablet Oral Daily  . rosuvastatin  10 mg Oral Daily   Continuous Infusions:   Active Problems:   Empyema lung (HCC)   Severe sepsis without septic shock ([place])   History of CVA in adulthood   Physical deconditioning   Dementia associated with other underlying disease without behavioral disturbance [facility])   Consultants:  TCTS  Conventional radiology  Procedures:  Chest tube placement 12/3 by IR  Antibiotics: Anti-infectives (From admission, onward)   Start     Dose/Rate Route Frequency Ordered Stop   02/05/20 1430  amoxicillin-clavulanate (AUGMENTIN) 875-125 MG per tablet 1  tablet        1 tablet Oral Every 12 hours 02/05/20 1338 02/17/20 0959   01/12/20 1400  metroNIDAZOLE (FLAGYL) tablet 500 mg  Status:  Discontinued        500 mg Oral Every 8 hours 01/12/20 1111 02/05/20 1337   01/12/20 1300  cefTRIAXone (ROCEPHIN) 2 g in sodium chloride 0.9 % 100 mL IVPB  Status:  Discontinued        2 g 200 mL/hr over 30 Minutes Intravenous Every 24 hours 01/12/20 1111 02/05/20 1337   01/02/20 2330   piperacillin-tazobactam (ZOSYN) IVPB 3.375 g  Status:  Discontinued        3.375 g 12.5 mL/hr over 240 Minutes Intravenous Every 8 hours 01/02/20 2242 01/12/20 1111       Time spent: 20 minutes    Erin Hearing ANP  [facility] 7 am- 330 pm/M-F for direct patient care and secure chat Please refer to Amion for contact information 40 days

## 2020-02-11 NOTE — Progress Notes (Signed)
Pt in good spirits today. Up to chair twice (breakfast and dinner). Pt drank both Ensures today and ate about 25% of each meal.  Pt has mushy brown bowel movements as soon as he stands up to start walking with his walker.

## 2020-02-12 DIAGNOSIS — F0280 Dementia in other diseases classified elsewhere without behavioral disturbance: Secondary | ICD-10-CM | POA: Diagnosis not present

## 2020-02-12 DIAGNOSIS — R5381 Other malaise: Secondary | ICD-10-CM | POA: Diagnosis not present

## 2020-02-12 DIAGNOSIS — Z8673 Personal history of transient ischemic attack (TIA), and cerebral infarction without residual deficits: Secondary | ICD-10-CM | POA: Diagnosis not present

## 2020-02-12 DIAGNOSIS — J869 Pyothorax without fistula: Secondary | ICD-10-CM | POA: Diagnosis not present

## 2020-02-12 NOTE — Progress Notes (Signed)
[facility] PROGRESS NOTE  [name] [name] VQQ:[id number] DOB: 1940/08/26 DOA: 01/02/2020 PCP: [name], [name], MD  Status: Remains inpatient appropriate because:Unsafe d/c plan   Dispo:  Patient From: HomePrivate residence  Planned Disposition: Skilled Nursing FacilitySNF  Expected discharge date: 02/15/2020  Medically stable for discharge: Yes barriers to discharge: Needs SNF placement but Medicaid application in process and as of 1/12 reportedly has been approved. As of 1/10 information has been submitted to [facility] with new [place] 2   Code Status: DNR Family Communication: Son [name] 1/07 DVT prophylaxis: Lovenox Vaccination status: 1/10 patient will be given first dose of Pfizer COVID-vaccine  Foley catheter:  No   HPI: 80 year old male with history of recent CVA in 11/2019 with residual dysarthria, hypertension, hyperlipidemia and spinal stenosis, initially presented to [facility] from [facility] SNF with confusion and difficulty breathing. Work-up revealed right-sided loculated pleural effusion with concern for empyema. IR at [facility] attempted ultrasound-guided chest tube placement but were unable due to thickness of pleural fluid. He was evaluated by general surgery at [facility] who recommended transfer to [facility] for thoracic surgery evaluation. Patient was evaluated by thoracic surgery who recommended chest tube placement by IR, that was performed on 01/03/2020. ID consulted and recommended transitioning from IV Zosyn to IV ceftriaxone and oral Flagyl while in-house and discharge on p.o. Augmentin for a total of 6 weeks with start date of 01/03/2020. Right-sided chest tube removed 12/16. TCTS signed off 12/17 and advised no outpatient follow-up needed.  Patient stable for discharge.  PT/OT recommending SNF.  Patient will need long-term Medicaid in place before can discharge.  Subjective: Sleeping soundly so did not awaken  Objective: Vitals:   02/12/20 0454  02/12/20 1323  BP: 121/73 111/75  Pulse: 69 78  Resp: 18 18  Temp: 98.2 F (36.8 C) 97.8 F (36.6 C)  SpO2: 99% 100%    Intake/Output Summary (Last 24 hours) at 02/12/2020 1421 Last data filed at 02/12/2020 1322 Gross per 24 hour  Intake 300 ml  Output 1600 ml  Net -1300 ml   Filed Weights   01/03/20 1414  Weight: 66.8 kg    Exam: Constitutional: Sleeping soundly, does not appear to be in any acute distress Respiratory: Anterior lung sounds clear to auscultation, stable on room air Cardiovascular: Heart sounds S1-S2 without any murmurs auscultated, pulses regular nontachycardic, no peripheral edema on exam Abdomen: Bowel sounds present, abdomen soft nontender nondistended normoactive bowel sounds.  Tolerating solid diet without difficulty.  LBM 1/09 Neurologic: Sleeping soundly but at baseline CN 2-12 grossly intact. Sensation intact,  Strength 5/5 x on left slightly weaker 4/5 on the right Psychiatric: Sleeping soundly but at baseline alert and oriented x name only. Flat affect   Assessment/Plan: Acute problems: Acute hypoxemic respiratory failure  2/2 right-sided parapneumonic effusion, empyema  **SEPSIS RULED OUT** -Hypoxemia (resolved)-TCTS consulted -s/p pigtail chest tube on 01/03/2020 and was removed on 12/16. -Follow-up chest x-ray 12/17 without pneumothorax.   -Thoracic surgery signed off 12/17, prn OP follow-up recommend  -Continue antibiotics (currently on ceftriaxone and metronidazole initially was treated with IV Zosyn beginning on 12/3) for a total of 6 weeks with expected last dose 02/17/2020.  1/5: discussed with ID- transitioned to Augmentin  Physical deconditioning/gait disturbance -PT and OT recommending SNF. -Evaluated today with documentation that he is very motivated but did asked to go back to bed after walking.  Encourage more frequent out of bed so he will build endurance.  Patient finally encouraged to stay up in  chair. -PT is also documented slow  shuffling steps while mobilizing  Hypertension -Controlled off antihypertensive medication  Severe protein calorie malnutrition Nutrition Problem: Severe Malnutrition Etiology: chronic illness (CVA) Signs/Symptoms: moderate fat depletion,severe fat depletion,moderate muscle depletion,severe muscle depletion Interventions: Ensure Enlive (each supplement provides 350kcal and 20 grams of protein),Magic cup,MVI -As of 12/23 patient able to feed self and eating meals well (between 50% and 100% of meals over the past 24 hours) Estimated body mass index is 21.13 kg/m as calculated from the following:   Height as of this encounter: 5\' 10"  (1.778 m).   Weight as of this encounter: 66.8 kg.   Wounds: Incision (Closed) 01/03/20 (Active)  Date First Assessed/Time First Assessed: 01/03/20 2000      Assessments 01/03/2020  8:00 PM 02/06/2020  8:00 PM  Dressing Type - None  Dressing Dry;Clean;Intact Clean;Dry;Intact  Dressing Change Frequency - PRN  Site / Wound Assessment Dressing in place / Unable to assess Dry;Clean  Drainage Description Serosanguineous -     No Linked orders to display     Other problems: Acute kidney injury -Resolved -Current creatinine 1.01 with a GFR greater than 60  History of CVA/dementia without behavioral disturbances -Remains normotensive off of BP medication -Continue Plavix and low-dose aspirin -Continue statin/Crestor -Continue Cymbalta  Anemia of chronic disease -Hemoglobin stable around 10   Data Reviewed: Basic Metabolic Panel: Recent Labs  Lab 02/08/20 0117  NA 136  K 4.6  CL 102  CO2 24  GLUCOSE 107*  BUN 20  CREATININE 0.95  CALCIUM 8.6*   Liver Function Tests: No results for input(s): AST, ALT, ALKPHOS, BILITOT, PROT, ALBUMIN in the last 168 hours. No results for input(s): LIPASE, AMYLASE in the last 168 hours. No results for input(s): AMMONIA in the last 168 hours. CBC: Recent Labs  Lab 02/08/20 0117  WBC 10.4  HGB 12.7*  HCT  39.9  MCV 81.9  PLT 250   Cardiac Enzymes: No results for input(s): CKTOTAL, CKMB, CKMBINDEX, TROPONINI in the last 168 hours. BNP (last 3 results) Recent Labs    12/26/19 1510 01/01/20 1018  BNP 88.7 177.1*    ProBNP (last 3 results) No results for input(s): PROBNP in the last 8760 hours.  CBG: No results for input(s): GLUCAP in the last 168 hours.  No results found for this or any previous visit (from the past 240 hour(s)).   Studies: No results found.  Scheduled Meds: . amoxicillin-clavulanate  1 tablet Oral Q12H  . aspirin EC  81 mg Oral Daily  . clopidogrel  75 mg Oral Daily  . DULoxetine  60 mg Oral Daily  . enoxaparin (LOVENOX) injection  40 mg Subcutaneous Daily  . feeding supplement  237 mL Oral TID BM  . multivitamin with minerals  1 tablet Oral Daily  . rosuvastatin  10 mg Oral Daily   Continuous Infusions:   Active Problems:   Empyema lung (HCC)   Severe sepsis without septic shock ([place])   History of CVA in adulthood   Physical deconditioning   Dementia associated with other underlying disease without behavioral disturbance ([place])   Consultants:  TCTS  Conventional radiology  Procedures:  Chest tube placement 12/3 by IR  Antibiotics: Anti-infectives (From admission, onward)   Start     Dose/Rate Route Frequency Ordered Stop   02/05/20 1430  amoxicillin-clavulanate (AUGMENTIN) 875-125 MG per tablet 1 tablet        1 tablet Oral Every 12 hours 02/05/20 1338 02/17/20 0959   01/12/20 1400  metroNIDAZOLE (FLAGYL) tablet 500 mg  Status:  Discontinued        500 mg Oral Every 8 hours 01/12/20 1111 02/05/20 1337   01/12/20 1300  cefTRIAXone (ROCEPHIN) 2 g in sodium chloride 0.9 % 100 mL IVPB  Status:  Discontinued        2 g 200 mL/hr over 30 Minutes Intravenous Every 24 hours 01/12/20 1111 02/05/20 1337   01/02/20 2330  piperacillin-tazobactam (ZOSYN) IVPB 3.375 g  Status:  Discontinued        3.375 g 12.5 mL/hr over 240 Minutes Intravenous  Every 8 hours 01/02/20 2242 01/12/20 1111       Time spent: 20 minutes    Erin Hearing ANP  [facility] 7 am- 330 pm/M-F for direct patient care and secure chat Please refer to Amion for contact information 41 days

## 2020-02-12 NOTE — Progress Notes (Signed)
Occupational Therapy Treatment Patient Details Name: [name] [name] MRN: [id number] DOB: [birth year] Today's Date: 02/12/2020    History of present illness 80 year old male with history of recent CVA in 11/2019 with residual dysarthria, hypertension, hyperlipidemia and spinal stenosis, initially presented to [facility] from [facility] SNF with confusion and difficulty breathing.  Work-up revealed right-sided loculated pleural effusion with concern for empyema. Transfer to [facility]. Patient was evaluated by thoracic surgery who recommended chest tube placement by IR, that was performed on 01/03/2020.  ID consulted and recommended transitioning from IV Zosyn to IV ceftriaxone and oral Flagyl while in-house and discharge on p.o. Augmentin for a total of 6 weeks with start date of 01/03/2020. Right-sided chest tube removed 12/16.   OT comments  Pt assisted continues to have bowel incontinence with standing, seemingly unaware. Assisted with pericare, transferred to chair where pt performed grooming and ate his lunch with set up. Updated goals. Pt remains appropriate for SNF.  Follow Up Recommendations  SNF;Supervision/Assistance - 24 hour    Equipment Recommendations  None recommended by OT    Recommendations for Other Services      Precautions / Restrictions Precautions Precautions: Fall Precaution Comments: bladder and bowel incontinence       Mobility Bed Mobility Overal bed mobility: Needs Assistance Bed Mobility: Supine to Sit     Supine to sit: Min assist     General bed mobility comments: pulled up on therapist's hand and rail, HOB up  Transfers Overall transfer level: Needs assistance Equipment used: Rolling walker (2 wheeled) Transfers: Sit to/from [facility] Sit to Stand: Min assist Stand pivot transfers: Min assist       General transfer comment: cues for hand placement, assist to rise and steady, increased time    Balance Overall balance assessment:  Needs assistance   Sitting balance-Leahy Scale: Fair       Standing balance-Leahy Scale: Poor Standing balance comment: reliant on B UE support of RW and min assist                           ADL either performed or assessed with clinical judgement   ADL Overall ADL's : Needs assistance/impaired Eating/Feeding: Minimal assistance;Sitting Eating/Feeding Details (indicate cue type and reason): assisted to open containers, apply condiments, cut food Grooming: Wash/dry hands;Sitting;Set up                       [place] and Hygiene: Total assistance;Sit to/from stand Toileting - Clothing Manipulation Details (indicate cue type and reason): pt with bowel incontinence upon standing             Vision       Perception     Praxis      Cognition Arousal/Alertness: Awake/alert Behavior During Therapy: Flat affect Overall Cognitive Status: History of cognitive impairments - at baseline                                 General Comments: slow initiation and processing speed        Exercises     Shoulder Instructions       General Comments      Pertinent Vitals/ Pain       Pain Assessment: No/denies pain  Home Living  Prior Functioning/Environment              Frequency  Min 2X/week        Progress Toward Goals  OT Goals(current goals can now be found in the care plan section)  Progress towards OT goals: Progressing toward goals  Acute Rehab OT Goals Patient Stated Goal: to get up to recliner to eat dinner OT Goal Formulation: With patient Time For Goal Achievement: 02/26/20 Potential to Achieve Goals: Fair ADL Goals Pt Will Perform Grooming: with min guard assist;standing Pt Will Perform Upper Body Dressing: with supervision;sitting Pt Will Transfer to Toilet: with min guard assist;ambulating;bedside commode Pt Will Perform Toileting  - Clothing Manipulation and hygiene: with min assist;sit to/from stand Additional ADL Goal #1: Pt will perform bed mobility with supervision in preparation for ADL.  Plan Discharge plan remains appropriate;Frequency remains appropriate    Co-evaluation                 AM-PAC OT "6 Clicks" Daily Activity     Outcome Measure   Help from another person eating meals?: A Little Help from another person taking care of personal grooming?: A Little Help from another person toileting, which includes using toliet, bedpan, or urinal?: Total Help from another person bathing (including washing, rinsing, drying)?: A Lot Help from another person to put on and taking off regular upper body clothing?: A Little Help from another person to put on and taking off regular lower body clothing?: Total 6 Click Score: 13    End of Session Equipment Utilized During Treatment: Gait belt;Rolling walker  OT Visit Diagnosis: Unsteadiness on feet (R26.81);Other abnormalities of gait and mobility (R26.89);Muscle weakness (generalized) (M62.81);Other symptoms and signs involving cognitive function   Activity Tolerance Patient tolerated treatment well   Patient Left in chair;with call bell/phone within reach;with chair alarm set   Nurse Communication          Time: 2130-8657 OT Time Calculation (min): 21 min  Charges: OT General Charges $OT Visit: 1 Visit OT Treatments $Self Care/Home Management : 8-22 mins  Nestor Lewandowsky, OTR/L Acute Rehabilitation Services Pager: [phone] Office: [phone]   Malka So 02/12/2020, 12:06 PM

## 2020-02-12 NOTE — TOC Progression Note (Signed)
Transition of Care [facility]) - Progression Note    Patient Details  Name: [name] [name] MRN: [id number] Date of Birth: 10-30-1940  Transition of Care [facility]) CM/SW [place], RN Phone Number: 02/12/2020, 2:29 PM  Clinical Narrative:    Case management spoke with Jackelyn Poling, CM at [facility] and they are evaluating the patient's Medicaid application and [employer] Medicare days to evaluate the patient for admission.  I sent [facility] additional therapy notes to assist with admission if the facility is able to offer a bed to the patient.  CM will continue to follow the patient for admission.   Expected Discharge Plan: Long Term Nursing Home Barriers to Discharge:  (Waiting on approval for [place] Medicaid)  Expected Discharge Plan and Services Expected Discharge Plan: [place]   Discharge Planning Services: CM Consult Post Acute Care Choice: [place] Living arrangements for the past 2 months: [place]                                       Social Determinants of Health (SDOH) Interventions    Readmission Risk Interventions No flowsheet data found.

## 2020-02-12 NOTE — Progress Notes (Signed)
CSW attempted to reach Herbie Baltimore at [place] regarding this patient's Medicaid - no answer, so a voicemail was left requesting a return call.  Madilyn Fireman, MSW, LCSW-A Transitions of Care  Clinical Social Worker I [phone]

## 2020-02-13 DIAGNOSIS — L8990 Pressure ulcer of unspecified site, unspecified stage: Secondary | ICD-10-CM | POA: Insufficient documentation

## 2020-02-13 LAB — SARS CORONAVIRUS 2 (TAT 6-24 HRS): SARS Coronavirus 2: NEGATIVE

## 2020-02-13 MED ORDER — AMOXICILLIN-POT CLAVULANATE 875-125 MG PO TABS: 1.0000 | ORAL_TABLET | Freq: Two times a day (BID) | ORAL | 0 refills | Status: DC

## 2020-02-13 MED ORDER — ACETAMINOPHEN 325 MG PO TABS: 650.0000 mg | ORAL_TABLET | Freq: Four times a day (QID) | ORAL | Status: DC | PRN
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
## 2020-02-13 NOTE — Progress Notes (Signed)
Pt able to feed self if you set him up, open containers, season and cut food for him.

## 2020-02-13 NOTE — Progress Notes (Signed)
Physical Therapy Treatment Patient Details Name: [name] [name] MRN: [id number] DOB: 1940-09-13 Today's Date: 02/13/2020    History of Present Illness 80 year old male with history of recent CVA in 11/2019 with residual dysarthria, hypertension, hyperlipidemia and spinal stenosis, initially presented to [facility] from [facility]F with confusion and difficulty breathing.  Work-up revealed right-sided loculated pleural effusion with concern for empyema. Transfer to [facility]. Patient was evaluated by thoracic surgery who recommended chest tube placement by IR, that was performed on 01/03/2020.  ID consulted and recommended transitioning from IV Zosyn to IV ceftriaxone and oral Flagyl while in-house and discharge on p.o. Augmentin for a total of 6 weeks with start date of 01/03/2020. Right-sided chest tube removed 12/16.    PT Comments    Pt supine on arrival, agreeable to therapy session and with good participation and tolerance for mobility. Pt remains incontinent, briefs donned prior to OOB mobility and pt rolling with min guard for cleanup prior to OOB. Pt needing increased time and modA for log roll transfer to EOB and modA for sit<>stand from elevated bed height. Once upright, pt min guard for gait trial 95ft (out of room and across hall) with chair follow for safety. Pt performed supine BLE therex with increased time and needing multimodal cues for completion 2/2 cognitive deficit/slow processing. Pt set back up to finish breakfast and agreeable to eat more. Pt continues to benefit from PT services to progress toward functional mobility goals. D/C recs below remain appropriate.   Follow Up Recommendations  SNF     Equipment Recommendations  Rolling walker with 5" wheels;3in1 (PT)    Recommendations for Other Services       Precautions / Restrictions Precautions Precautions: Fall Precaution Comments: Bring briefs -bladder and bowel incontinence Restrictions Weight Bearing Restrictions:  No    Mobility  Bed Mobility Overal bed mobility: Needs Assistance Bed Mobility: Supine to Sit Rolling: Min guard Sidelying to sit: Mod assist       General bed mobility comments: flat HOB, increased time/effort to perform, cues for sequencing, transfer pad assist  Transfers Overall transfer level: Needs assistance Equipment used: Rolling walker (2 wheeled) Transfers: Sit to/from Stand Sit to Stand: Mod assist;From elevated surface         General transfer comment: cues for hand placement, assist to rise and steady, increased time  Ambulation/Gait Ambulation/Gait assistance: Min guard Gait Distance (Feet): 25 Feet Assistive device: Rolling walker (2 wheeled) Gait Pattern/deviations: Step-to pattern;Wide base of support;Shuffle;Decreased stride length Gait velocity: grossly <0.2 m/s   General Gait Details: requires extra time to clear and advance LLE, very slow cadence; fair use of RW and dense cues for step sequencing/longer strides; chair follow for safety   Stairs             Wheelchair Mobility    Modified Rankin (Stroke Patients Only)       Balance Overall balance assessment: Needs assistance Sitting-balance support: Feet supported Sitting balance-Leahy Scale: Fair Sitting balance - Comments: some R lean, needs cues for maintaining upright posture at times, able to don mask seated while unsupported no LOB     Standing balance-Leahy Scale: Poor Standing balance comment: reliant on B UE support of RW and external assist                            Cognition Arousal/Alertness: Awake/alert Behavior During Therapy: Flat affect Overall Cognitive Status: History of cognitive impairments - at baseline Area of Impairment:  Orientation;Attention;Memory;Following commands;Safety/judgement;Awareness;Problem solving                 Orientation Level: Disoriented to;Situation;Place Current Attention Level: Selective Memory: Decreased recall of  precautions;Decreased short-term memory Following Commands: Follows one step commands with increased time Safety/Judgement: Decreased awareness of safety;Decreased awareness of deficits Awareness: Intellectual Problem Solving: Slow processing;Requires verbal cues;Requires tactile cues;Difficulty sequencing General Comments: slow initiation and processing speed      Exercises General Exercises - Lower Extremity Ankle Circles/Pumps: AROM;Strengthening;Both;10 reps;Supine Heel Slides: AAROM;Both;10 reps;Supine Hip ABduction/ADduction: AAROM;Strengthening;Both;10 reps;Supine    General Comments General comments (skin integrity, edema, etc.): pt incontinent of bowel/bladder on arrival to room, rolls well and totalA needed for peri-care, needs totalA for donning briefs prior to mobility      Pertinent Vitals/Pain Pain Assessment: Faces Pain Score: 2  Faces Pain Scale: Hurts a little bit Pain Location: R rib cage when donning gait belt and c/o tailbone soreness Pain Descriptors / Indicators: Sore Pain Intervention(s): Monitored during session;Repositioned    Home Living                      Prior Function            PT Goals (current goals can now be found in the care plan section) Acute Rehab PT Goals Patient Stated Goal: to get OOB PT Goal Formulation: With patient/family Time For Goal Achievement: 02/17/20 Potential to Achieve Goals: Fair Progress towards PT goals: Progressing toward goals    Frequency    Min 2X/week      PT Plan Current plan remains appropriate    Co-evaluation              AM-PAC PT "6 Clicks" Mobility   Outcome Measure  Help needed turning from your back to your side while in a flat bed without using bedrails?: A Little Help needed moving from lying on your back to sitting on the side of a flat bed without using bedrails?: A Lot Help needed moving to and from a bed to a chair (including a wheelchair)?: A Little Help needed  standing up from a chair using your arms (e.g., wheelchair or bedside chair)?: A Lot Help needed to walk in hospital room?: A Little Help needed climbing 3-5 steps with a railing? : Total 6 Click Score: 14    End of Session Equipment Utilized During Treatment: Gait belt Activity Tolerance: Patient tolerated treatment well Patient left: in chair;with call bell/phone within reach;with chair alarm set Nurse Communication: Mobility status PT Visit Diagnosis: Difficulty in walking, not elsewhere classified (R26.2);Muscle weakness (generalized) (M62.81)     Time: 4193-7902 PT Time Calculation (min) (ACUTE ONLY): 32 min  Charges:  $Gait Training: 8-22 mins $Therapeutic Activity: 8-22 mins                     Vincenzina Jagoda P., PTA Acute Rehabilitation Services Pager: [phone] Office: [place] 02/13/2020, 11:33 AM

## 2020-02-13 NOTE — Progress Notes (Signed)
[facility] PROGRESS NOTE  [name] [name] WUJ:[id number] DOB: Jun 23, 1940 DOA: 01/02/2020 PCP: [name], [name], MD  Status: Remains inpatient appropriate because:Unsafe d/c plan   Dispo:  Patient From: HomePrivate residence  Planned Disposition: Skilled Nursing FacilitySNF  Expected discharge date: 02/15/2020  Medically stable for discharge: Yes barriers to discharge: Needs SNF placement but Medicaid application in process and as of 1/12 reportedly has been approved.  [facility] has tentatively accepted patient but has had difficulty in family working with them and may need partial payment until [facility] approved.  Patient sister not willing to sign patient in for fear of being responsible for bill.  TOC has been unable to contact son.  Unconfirmed reports that patient received a sum of money that the son now has access to and may not be spending appropriately including on needs for patient.  [facility] director Mr. [name] made aware in the event we need to pursue guardianship for this patient.  Because of these concerns nurse case manager has contacted [employer] as of 1/13.   Code Status: DNR Family Communication: Son [name] 1/07 DVT prophylaxis: Lovenox Vaccination status: 1/10 patient will be given first dose of Pfizer COVID-vaccine  Foley catheter:  No   HPI: 80 year old male with history of recent CVA in 11/2019 with residual dysarthria, hypertension, hyperlipidemia and spinal stenosis, initially presented to [facility] from [facility] SNF with confusion and difficulty breathing. Work-up revealed right-sided loculated pleural effusion with concern for empyema. IR at [facility] attempted ultrasound-guided chest tube placement but were unable due to thickness of pleural fluid. He was evaluated by general surgery at [facility] who recommended transfer to [facility] for thoracic surgery evaluation. Patient was evaluated by thoracic surgery who recommended chest tube placement by IR, that was  performed on 01/03/2020. ID consulted and recommended transitioning from IV Zosyn to IV ceftriaxone and oral Flagyl while in-house and discharge on p.o. Augmentin for a total of 6 weeks with start date of 01/03/2020. Right-sided chest tube removed 12/16. TCTS signed off 12/17 and advised no outpatient follow-up needed.  Patient stable for discharge.  PT/OT recommending SNF.  Patient will need long-term Medicaid in place before can discharge.  Subjective: Alert, sitting up in bed and slowly eating breakfast.  No complaints.  Objective: Vitals:   02/12/20 2041 02/13/20 0440  BP: 125/78 122/74  Pulse: 78 77  Resp: 18 17  Temp: 98.2 F (36.8 C) 98.1 F (36.7 C)  SpO2: 98% 97%    Intake/Output Summary (Last 24 hours) at 02/13/2020 1321 Last data filed at 02/13/2020 0800 Gross per 24 hour  Intake 537 ml  Output 650 ml  Net -113 ml   Filed Weights   01/03/20 1414  Weight: 66.8 kg    Exam: Constitutional: Alert, no acute distress Respiratory: Stable on room air, bilateral lung sounds clear Cardiovascular: Pulses regular and not bradycardic or tachycardic, heart sounds S1-S2, extremities warm with adequate capillary refill Abdomen: Soft with normoactive bowel sounds.  LBM 1/12 Neurologic: CN 2-12 grossly intact. Sensation intact,  Strength 5/5 x on left slightly weaker 4/5 on the right Psychiatric: Alert and oriented x name only. Flat affect   Assessment/Plan: Acute problems: Acute hypoxemic respiratory failure  2/2 right-sided parapneumonic effusion, empyema  **SEPSIS RULED OUT** -Hypoxemia (resolved)-TCTS consulted -s/p pigtail chest tube on 01/03/2020 and was removed on 12/16. -Follow-up chest x-ray 12/17 without pneumothorax.   -Thoracic surgery signed off 12/17, prn OP follow-up recommend  -Continue antibiotics (currently on ceftriaxone and metronidazole initially was treated with  IV Zosyn beginning on 12/3) for a total of 6 weeks with expected last dose 02/17/2020.  1/5: discussed  with ID- transitioned to Augmentin  Physical deconditioning/gait disturbance -PT and OT recommending SNF. -Evaluated today with documentation that he is very motivated but did asked to go back to bed after walking.  Encourage more frequent out of bed so he will build endurance.  Patient finally encouraged to stay up in chair. -PT is also documented slow shuffling steps while mobilizing  Hypertension -Controlled off antihypertensive medication  Severe protein calorie malnutrition Nutrition Problem: Severe Malnutrition Etiology: chronic illness (CVA) Signs/Symptoms: moderate fat depletion,severe fat depletion,moderate muscle depletion,severe muscle depletion Interventions: Ensure Enlive (each supplement provides 350kcal and 20 grams of protein),Magic cup,MVI -As of 12/23 patient able to feed self and eating meals well (between 50% and 100% of meals over the past 24 hours) Estimated body mass index is 21.13 kg/m as calculated from the following:   Height as of this encounter: 5\' 10"  (1.778 m).   Weight as of this encounter: 66.8 kg.   Wounds: Incision (Closed) 01/03/20 (Active)  Date First Assessed/Time First Assessed: 01/03/20 2000      Assessments 01/03/2020  8:00 PM 02/06/2020  8:00 PM  Dressing Type - None  Dressing Dry;Clean;Intact Clean;Dry;Intact  Dressing Change Frequency - PRN  Site / Wound Assessment Dressing in place / Unable to assess Dry;Clean  Drainage Description Serosanguineous -     No Linked orders to display     Pressure Injury 02/12/20 Sacrum Mid Stage 2 -  Partial thickness loss of dermis presenting as a shallow open injury with a red, pink wound bed without slough. (Active)  Date First Assessed/Time First Assessed: 02/12/20 1930   Location: Sacrum  Location Orientation: Mid  Staging: Stage 2 -  Partial thickness loss of dermis presenting as a shallow open injury with a red, pink wound bed without slough.    Assessments 02/12/2020  9:23 PM  Dressing Type Foam -  Lift dressing to assess site every shift  Dressing Changed  Site / Wound Assessment Clean;Dry  Drainage Amount None     No Linked orders to display     Other problems: Acute kidney injury -Resolved -Current creatinine 1.01 with a GFR greater than 60  History of CVA/dementia without behavioral disturbances -Remains normotensive off of BP medication -Continue Plavix and low-dose aspirin -Continue statin/Crestor -Continue Cymbalta  Anemia of chronic disease -Hemoglobin stable around 10   Data Reviewed: Basic Metabolic Panel: Recent Labs  Lab 02/08/20 0117  NA 136  K 4.6  CL 102  CO2 24  GLUCOSE 107*  BUN 20  CREATININE 0.95  CALCIUM 8.6*   Liver Function Tests: No results for input(s): AST, ALT, ALKPHOS, BILITOT, PROT, ALBUMIN in the last 168 hours. No results for input(s): LIPASE, AMYLASE in the last 168 hours. No results for input(s): AMMONIA in the last 168 hours. CBC: Recent Labs  Lab 02/08/20 0117  WBC 10.4  HGB 12.7*  HCT 39.9  MCV 81.9  PLT 250   Cardiac Enzymes: No results for input(s): CKTOTAL, CKMB, CKMBINDEX, TROPONINI in the last 168 hours. BNP (last 3 results) Recent Labs    12/26/19 1510 01/01/20 1018  BNP 88.7 177.1*    ProBNP (last 3 results) No results for input(s): PROBNP in the last 8760 hours.  CBG: No results for input(s): GLUCAP in the last 168 hours.  No results found for this or any previous visit (from the past 240 hour(s)).   Studies: No  results found.  Scheduled Meds: . amoxicillin-clavulanate  1 tablet Oral Q12H  . aspirin EC  81 mg Oral Daily  . clopidogrel  75 mg Oral Daily  . DULoxetine  60 mg Oral Daily  . enoxaparin (LOVENOX) injection  40 mg Subcutaneous Daily  . feeding supplement  237 mL Oral TID BM  . multivitamin with minerals  1 tablet Oral Daily  . rosuvastatin  10 mg Oral Daily   Continuous Infusions:   Active Problems:   Empyema lung ([place])   Severe sepsis without septic shock ([place])    History of CVA in adulthood   Physical deconditioning   Dementia associated with other underlying disease without behavioral disturbance ([place])   Pressure injury of skin   Consultants:  TCTS  Conventional radiology  Procedures:  Chest tube placement 12/3 by IR  Antibiotics: Anti-infectives (From admission, onward)   Start     Dose/Rate Route Frequency Ordered Stop   02/13/20 0000  amoxicillin-clavulanate (AUGMENTIN) 875-125 MG tablet        1 tablet Oral Every 12 hours 02/13/20 1316 02/14/20 2359   02/05/20 1430  amoxicillin-clavulanate (AUGMENTIN) 875-125 MG per tablet 1 tablet        1 tablet Oral Every 12 hours 02/05/20 1338 02/17/20 0959   01/12/20 1400  metroNIDAZOLE (FLAGYL) tablet 500 mg  Status:  Discontinued        500 mg Oral Every 8 hours 01/12/20 1111 02/05/20 1337   01/12/20 1300  cefTRIAXone (ROCEPHIN) 2 g in sodium chloride 0.9 % 100 mL IVPB  Status:  Discontinued        2 g 200 mL/hr over 30 Minutes Intravenous Every 24 hours 01/12/20 1111 02/05/20 1337   01/02/20 2330  piperacillin-tazobactam (ZOSYN) IVPB 3.375 g  Status:  Discontinued        3.375 g 12.5 mL/hr over 240 Minutes Intravenous Every 8 hours 01/02/20 2242 01/12/20 1111       Time spent: 20 minutes    Erin Hearing ANP  [facility] 7 am- 330 pm/M-F for direct patient care and secure chat Please refer to Amion for contact information 42 days

## 2020-02-14 ENCOUNTER — Inpatient Hospital Stay: Payer: Medicare HMO | Admitting: Internal Medicine

## 2020-02-14 NOTE — Progress Notes (Addendum)
[facility] PROGRESS NOTE  [name] [name] [id number] DOB: [birth year] DOA: 01/02/2020 PCP: [name], [name], MD  Status: Remains inpatient appropriate because:Unsafe d/c plan   Dispo:  Patient From: HomePrivate residence  Planned Disposition: Skilled Nursing FacilitySNF  Expected discharge date: 02/15/2020  Medically stable for discharge: Yes barriers to discharge: Needs SNF placement but Medicaid application in process and as of 1/12 reportedly has been approved.  [facility] had tentatively accepted patient but has had difficulty in family working with them and may need partial payment until [facility] approved.  Patient sister not willing to sign patient in for fear of being responsible for bill.  TOC has been unable to contact son.  Unconfirmed reports that patient received a sum of money that the son now has access to and may not be spending appropriately including on needs for patient.  [facility] director Mr. [name] made aware in the event we need to pursue guardianship for this patient.  Because of these concerns nurse case manager has contacted [employer] as of 1/13.   Code Status: DNR Family Communication: Son [name] 1/07-TOC has made repeated calls to patient's son on 1/12, 1/13 and 1/14 without any response; TOC spoke with patient's sister on 1/13 DVT prophylaxis: Lovenox Vaccination status: 1/10 patient given first dose of Pfizer COVID-vaccine  Foley catheter:  No   HPI: 80 year old male with history of recent CVA in 11/2019 with residual dysarthria, hypertension, hyperlipidemia and spinal stenosis, initially presented to [facility] from [facility] with confusion and difficulty breathing. Work-up revealed right-sided loculated pleural effusion with concern for empyema. IR at [facility] attempted ultrasound-guided chest tube placement but were unable due to thickness of pleural fluid. He was evaluated by general surgery at [facility] who recommended transfer to [facility] for  thoracic surgery evaluation. Patient was evaluated by thoracic surgery who recommended chest tube placement by IR, that was performed on 01/03/2020. ID consulted and recommended transitioning from IV Zosyn to IV ceftriaxone and oral Flagyl while in-house and discharge on p.o. Augmentin for a total of 6 weeks with start date of 01/03/2020. Right-sided chest tube removed 12/16. TCTS signed off 12/17 and advised no outpatient follow-up needed.  Patient stable for discharge.  PT/OT recommending SNF.  Patient will need long-term Medicaid in place before can discharge.  Subjective: Alert, sitting up in bed eating breakfast.  Interactive with conversation today.  Smiling and making jokes.  Objective: Vitals:   02/13/20 1959 02/14/20 0313  BP: 120/76 118/75  Pulse: 94 95  Resp: 18 18  Temp: 98.5 F (36.9 C) 98.3 F (36.8 C)  SpO2: 98% 99%    Intake/Output Summary (Last 24 hours) at 02/14/2020 1321 Last data filed at 02/14/2020 1259 Gross per 24 hour  Intake 480 ml  Output 450 ml  Net 30 ml   Filed Weights   01/03/20 1414  Weight: 66.8 kg    Exam: Constitutional: Alert, no acute distress Respiratory: Lung sounds clear, stable on room air Cardiovascular: Heart sounds S1-S2, pulse regular, appropriate capillary refill l Abdomen: Soft with normoactive bowel sounds.  LBM 1/12 Neurologic: CN 2-12 grossly intact. Sensation intact,  Strength 5/5 x on left slightly weaker 4/5 on the right Psychiatric: Alert and oriented x name only. Flat affect   Assessment/Plan: Acute problems: Acute hypoxemic respiratory failure  2/2 right-sided parapneumonic effusion, empyema  **SEPSIS RULED OUT** -Hypoxemia (resolved)-TCTS consulted -s/p pigtail chest tube on 01/03/2020 and was removed on 12/16. -Follow-up chest x-ray 12/17 without pneumothorax.   -Thoracic surgery signed off  12/17, prn OP follow-up recommend  -Continue antibiotics (currently on ceftriaxone and metronidazole initially was treated with IV  Zosyn beginning on 12/3) for a total of 6 weeks with expected last dose 02/17/2020.  1/5: discussed with ID- transitioned to Augmentin  Physical deconditioning/gait disturbance -PT and OT recommending SNF. -Evaluated today with documentation that he is very motivated but did asked to go back to bed after walking.  Encourage more frequent out of bed so he will build endurance.  Patient finally encouraged to stay up in chair. -PT is also documented slow shuffling steps while mobilizing  Hypertension -Controlled off antihypertensive medication  Severe protein calorie malnutrition Nutrition Problem: Severe Malnutrition Etiology: chronic illness (CVA) Signs/Symptoms: moderate fat depletion,severe fat depletion,moderate muscle depletion,severe muscle depletion Interventions: Ensure Enlive (each supplement provides 350kcal and 20 grams of protein),Magic cup,MVI -As of 12/23 patient able to feed self and eating meals well (between 50% and 100% of meals over the past 24 hours) Estimated body mass index is 21.13 kg/m as calculated from the following:   Height as of this encounter: 5\' 10"  (1.778 m).   Weight as of this encounter: 66.8 kg.   Wounds: Incision (Closed) 01/03/20 (Active)  Date First Assessed/Time First Assessed: 01/03/20 2000      Assessments 01/03/2020  8:00 PM 02/06/2020  8:00 PM  Dressing Type -- None  Dressing Dry;Clean;Intact Clean;Dry;Intact  Dressing Change Frequency -- PRN  Site / Wound Assessment Dressing in place / Unable to assess Dry;Clean  Drainage Description Serosanguineous --     No Linked orders to display     Pressure Injury 02/12/20 Sacrum Mid Stage 2 -  Partial thickness loss of dermis presenting as a shallow open injury with a red, pink wound bed without slough. (Active)  Date First Assessed/Time First Assessed: 02/12/20 1930   Location: Sacrum  Location Orientation: Mid  Staging: Stage 2 -  Partial thickness loss of dermis presenting as a shallow open injury  with a red, pink wound bed without slough.    Assessments 02/12/2020  9:23 PM 02/14/2020  2:51 AM  Dressing Type Foam - Lift dressing to assess site every shift --  Dressing Changed Changed  Site / Wound Assessment Clean;Dry Clean;Dry  Margins -- Attached edges (approximated)  Drainage Amount None None     No Linked orders to display     Other problems: Acute kidney injury -Resolved -Current creatinine 1.01 with a GFR greater than 60  History of CVA/dementia without behavioral disturbances -Remains normotensive off of BP medication -Continue Plavix and low-dose aspirin -Continue statin/Crestor -Continue Cymbalta  Anemia of chronic disease -Hemoglobin stable around 10   Data Reviewed: Basic Metabolic Panel: Recent Labs  Lab 02/08/20 0117  NA 136  K 4.6  CL 102  CO2 24  GLUCOSE 107*  BUN 20  CREATININE 0.95  CALCIUM 8.6*   Liver Function Tests: No results for input(s): AST, ALT, ALKPHOS, BILITOT, PROT, ALBUMIN in the last 168 hours. No results for input(s): LIPASE, AMYLASE in the last 168 hours. No results for input(s): AMMONIA in the last 168 hours. CBC: Recent Labs  Lab 02/08/20 0117  WBC 10.4  HGB 12.7*  HCT 39.9  MCV 81.9  PLT 250   Cardiac Enzymes: No results for input(s): CKTOTAL, CKMB, CKMBINDEX, TROPONINI in the last 168 hours. BNP (last 3 results) Recent Labs    12/26/19 1510 01/01/20 1018  BNP 88.7 177.1*    ProBNP (last 3 results) No results for input(s): PROBNP in the last 8760 hours.  CBG: No results for input(s): GLUCAP in the last 168 hours.  Recent Results (from the past 240 hour(s))  SARS CORONAVIRUS 2 (TAT 6-24 HRS) Nasopharyngeal Nasopharyngeal Swab     Status: None   Collection Time: 02/13/20 11:42 AM   Specimen: Nasopharyngeal Swab  Result Value Ref Range Status   SARS Coronavirus 2 NEGATIVE NEGATIVE Final    Comment: (NOTE) SARS-CoV-2 target nucleic acids are NOT DETECTED.  The SARS-CoV-2 RNA is generally detectable in  upper and lower respiratory specimens during the acute phase of infection. Negative results do not preclude SARS-CoV-2 infection, do not rule out co-infections with other pathogens, and should not be used as the sole basis for treatment or other patient management decisions. Negative results must be combined with clinical observations, patient history, and epidemiological information. The expected result is Negative.  Fact Sheet for Patients: SugarRoll.be  Fact Sheet for Healthcare Providers: https://www.woods-mathews.com/  This test is not yet approved or cleared by the [place] FDA and  has been authorized for detection and/or diagnosis of SARS-CoV-2 by FDA under an Emergency Use Authorization (EUA). This EUA will remain  in effect (meaning this test can be used) for the duration of the COVID-19 declaration under Se ction 564(b)(1) of the Act, 21 U.S.C. section 360bbb-3(b)(1), unless the authorization is terminated or revoked sooner.  Performed at [facility], [place] [address], [place], [place] [address]      Studies: No results found.  Scheduled Meds: . amoxicillin-clavulanate  1 tablet Oral Q12H  . aspirin EC  81 mg Oral Daily  . clopidogrel  75 mg Oral Daily  . DULoxetine  60 mg Oral Daily  . enoxaparin (LOVENOX) injection  40 mg Subcutaneous Daily  . feeding supplement  237 mL Oral TID BM  . multivitamin with minerals  1 tablet Oral Daily  . rosuvastatin  10 mg Oral Daily   Continuous Infusions:   Active Problems:   Empyema lung ([place])   Severe sepsis without septic shock ([place])   History of CVA in adulthood   Physical deconditioning   Dementia associated with other underlying disease without behavioral disturbance ([place])   Pressure injury of skin   Consultants:  TCTS  Conventional radiology  Procedures:  Chest tube placement 12/3 by IR  Antibiotics: Anti-infectives (From admission, onward)   Start      Dose/Rate Route Frequency Ordered Stop   02/13/20 0000  amoxicillin-clavulanate (AUGMENTIN) 875-125 MG tablet        1 tablet Oral Every 12 hours 02/13/20 1316 02/14/20 2359   02/05/20 1430  amoxicillin-clavulanate (AUGMENTIN) 875-125 MG per tablet 1 tablet        1 tablet Oral Every 12 hours 02/05/20 1338 02/17/20 0959   01/12/20 1400  metroNIDAZOLE (FLAGYL) tablet 500 mg  Status:  Discontinued        500 mg Oral Every 8 hours 01/12/20 1111 02/05/20 1337   01/12/20 1300  cefTRIAXone (ROCEPHIN) 2 g in sodium chloride 0.9 % 100 mL IVPB  Status:  Discontinued        2 g 200 mL/hr over 30 Minutes Intravenous Every 24 hours 01/12/20 1111 02/05/20 1337   01/02/20 2330  piperacillin-tazobactam (ZOSYN) IVPB 3.375 g  Status:  Discontinued        3.375 g 12.5 mL/hr over 240 Minutes Intravenous Every 8 hours 01/02/20 2242 01/12/20 1111       Time spent: 20 minutes    Erin Hearing ANP  [facility] 7 am- 330 pm/M-F for  direct patient care and secure chat Please refer to Amion for contact information 43 days

## 2020-02-15 DIAGNOSIS — Z4682 Encounter for fitting and adjustment of non-vascular catheter: Secondary | ICD-10-CM

## 2020-02-15 NOTE — Progress Notes (Addendum)
[facility]  PROGRESS NOTE  [name] [name] BMW:[id number] DOB: May 01, 1940 DOA: 01/02/2020 PCP: [name], [name], MD   Brief HPI:   80 year old male with history of recent CVA in October 2021 with residual dysarthria, hypertension, hyperlipidemia, spinal stenosis who initially presented to [facility] from [facility] skilled nursing facility with confusion and difficulty breathing.  Work-up revealed right-sided loculated pleural effusion with concern for empyema.  Patient underwent chest tube placement by IR on 01/03/2020.  ID was consulted and recommended ceftriaxone and oral Flagyl while in-house and discharged to p.o. Augmentin for total 6 weeks with start date of 01/03/2020.  Right-sided chest tube was removed on 01/16/2020.  Thoracic surgery signed off on 01/17/2020 and advised no outpatient follow-up needed.  Patient awaiting to go to skilled nursing facility.    Subjective   Patient seen and examined, denies any complaints.   Assessment/Plan:     1. Acute hypoxemic respiratory failure-secondary to right-sided parapneumonic effusion, empyema.  Status post chest tube which was removed on 01/15/2021.  Thoracic surgery signed off.  Continue ceftriaxone and Flagyl for total 6 weeks, beginning from 01/03/2020.  Expected last dose 02/17/2020. 2. Physical deconditioning-gait disturbance-PT OT recommend skilled nursing facility.  Awaiting bed at skilled facility. 3. Hypertension-blood pressures controlled.  He is off antihypertensive medications. 4. Severe protein-calorie hypertension-continue Ensure Enlive, Magic cup, MVI.  Patient is able to feed self and eat meals well.     COVID-19 Labs  No results for input(s): DDIMER, FERRITIN, LDH, CRP in the last 72 hours.  Lab Results  Component Value Date   SARSCOV2NAA NEGATIVE 02/13/2020   SARSCOV2NAA NEGATIVE 12/26/2019   [place] NEGATIVE 11/05/2019   [place] NEGATIVE 11/01/2019     Scheduled medications:   .  amoxicillin-clavulanate  1 tablet Oral Q12H  . aspirin EC  81 mg Oral Daily  . clopidogrel  75 mg Oral Daily  . DULoxetine  60 mg Oral Daily  . enoxaparin (LOVENOX) injection  40 mg Subcutaneous Daily  . feeding supplement  237 mL Oral TID BM  . multivitamin with minerals  1 tablet Oral Daily  . rosuvastatin  10 mg Oral Daily           Antibiotics: Anti-infectives (From admission, onward)   Start     Dose/Rate Route Frequency Ordered Stop   02/13/20 0000  amoxicillin-clavulanate (AUGMENTIN) 875-125 MG tablet        1 tablet Oral Every 12 hours 02/13/20 1316 02/14/20 2359   02/05/20 1430  amoxicillin-clavulanate (AUGMENTIN) 875-125 MG per tablet 1 tablet        1 tablet Oral Every 12 hours 02/05/20 1338 02/17/20 0959   01/12/20 1400  metroNIDAZOLE (FLAGYL) tablet 500 mg  Status:  Discontinued        500 mg Oral Every 8 hours 01/12/20 1111 02/05/20 1337   01/12/20 1300  cefTRIAXone (ROCEPHIN) 2 g in sodium chloride 0.9 % 100 mL IVPB  Status:  Discontinued        2 g 200 mL/hr over 30 Minutes Intravenous Every 24 hours 01/12/20 1111 02/05/20 1337   01/02/20 2330  piperacillin-tazobactam (ZOSYN) IVPB 3.375 g  Status:  Discontinued        3.375 g 12.5 mL/hr over 240 Minutes Intravenous Every 8 hours 01/02/20 2242 01/12/20 1111       DVT prophylaxis: Lovenox  Code Status: DNR  Family Communication: No family at bedside   Consultants:  IR  Infectious disease  Procedures:  Chest tube placement    Objective  Vitals:   02/14/20 0313 02/14/20 1332 02/14/20 2105 02/15/20 0400  BP: 118/75 116/74 117/82 115/80  Pulse: 95 91 70 64  Resp: 18 18 19 16   Temp: 98.3 F (36.8 C) 98.2 F (36.8 C) 98.3 F (36.8 C) 98.2 F (36.8 C)  TempSrc: Oral Oral Oral Oral  SpO2: 99% 98% 97% 100%  Weight:      Height:        Intake/Output Summary (Last 24 hours) at 02/15/2020 1432 Last data filed at 02/15/2020 [id number] Gross per 24 hour  Intake 340 ml  Output 600 ml  Net -260 ml     01/13 1901 - 01/15 0700 In: 600 [P.O.:600] Out: 1050 [Urine:1050]  Filed Weights   01/03/20 1414  Weight: 66.8 kg    Physical Examination:   General-appears in no acute distress Heart-S1-S2, regular, no murmur auscultated Lungs-clear to auscultation bilaterally, no wheezing or crackles auscultated Abdomen-soft, nontender, no organomegaly Extremities-no edema in the lower extremities Neuro-alert, oriented x3, no focal deficit noted  Status is: Inpatient  Dispo: The patient is from: Home              Anticipated d/c is to: Skilled nursing facility              Anticipated d/c date is: 02/20/2020              Patient currently not stable for discharge  Barrier to discharge-awaiting bed at skilled nursing facility  Pressure Injury 02/12/20 Sacrum Mid Stage 2 -  Partial thickness loss of dermis presenting as a shallow open injury with a red, pink wound bed without slough. (Active)  02/12/20 1930  Location: Sacrum  Location Orientation: Mid  Staging: Stage 2 -  Partial thickness loss of dermis presenting as a shallow open injury with a red, pink wound bed without slough.  Wound Description (Comments):   Present on Admission:            Data Reviewed:   Recent Results (from the past 240 hour(s))  SARS CORONAVIRUS 2 (TAT 6-24 HRS) Nasopharyngeal Nasopharyngeal Swab     Status: None   Collection Time: 02/13/20 11:42 AM   Specimen: Nasopharyngeal Swab  Result Value Ref Range Status   SARS Coronavirus 2 NEGATIVE NEGATIVE Final    Comment: (NOTE) SARS-CoV-2 target nucleic acids are NOT DETECTED.  The SARS-CoV-2 RNA is generally detectable in upper and lower respiratory specimens during the acute phase of infection. Negative results do not preclude SARS-CoV-2 infection, do not rule out co-infections with other pathogens, and should not be used as the sole basis for treatment or other patient management decisions. Negative results must be combined with clinical  observations, patient history, and epidemiological information. The expected result is Negative.  Fact Sheet for Patients: SugarRoll.be  Fact Sheet for Healthcare Providers: https://www.woods-mathews.com/  This test is not yet approved or cleared by the [place] FDA and  has been authorized for detection and/or diagnosis of SARS-CoV-2 by FDA under an Emergency Use Authorization (EUA). This EUA will remain  in effect (meaning this test can be used) for the duration of the COVID-19 declaration under Se ction 564(b)(1) of the Act, 21 U.S.C. section 360bbb-3(b)(1), unless the authorization is terminated or revoked sooner.  Performed at [facility], [place] [address] [place], [place] [address]     BNP (last 3 results) Recent Labs    12/26/19 1510 01/01/20 1018  BNP 88.7 177.1*        Jacobus  [facility] If 7PM-7AM, please contact night-coverage at [email or web address], Office  [phone]   02/15/2020, 2:32 PM  LOS: 44 days

## 2020-02-16 NOTE — Progress Notes (Signed)
[facility]  PROGRESS NOTE  [name] [name] WCH:[id number] DOB: 1940/10/14 DOA: 01/02/2020 PCP: [name], [name] [name]   Brief HPI:   80 year old male with history of recent CVA in October 2021 with residual dysarthria, hypertension, hyperlipidemia, spinal stenosis who initially presented to [facility] from [facility] skilled nursing facility with confusion and difficulty breathing.  Work-up revealed right-sided loculated pleural effusion with concern for empyema.  Patient underwent chest tube placement by IR on 01/03/2020.  ID was consulted and recommended ceftriaxone and oral Flagyl while in-house and discharged to p.o. Augmentin for total 6 weeks with start date of 01/03/2020.  Right-sided chest tube was removed on 01/16/2020.  Thoracic surgery signed off on 01/17/2020 and advised no outpatient follow-up needed.  Patient awaiting to go to skilled nursing facility.    Subjective   Patient seen and examined, denies new complaints.   Assessment/Plan:     1. Acute hypoxemic respiratory failure-secondary to right-sided parapneumonic effusion, empyema.  Status post chest tube which was removed on 01/15/2021.  Thoracic surgery signed off.  Continue ceftriaxone and Flagyl for total 6 weeks, beginning from 01/03/2020.  Expected last dose 02/17/2020. 2. Physical deconditioning-gait disturbance-PT OT recommend skilled nursing facility.  Awaiting bed at skilled facility. 3. Hypertension-blood pressures controlled.  He is off antihypertensive medications. 4. Severe protein-calorie hypertension-continue Ensure Enlive, Magic cup, MVI.  Patient is able to feed self and eat meals well.     COVID-19 Labs  No results for input(s): DDIMER, FERRITIN, LDH, CRP in the last 72 hours.  Lab Results  Component Value Date   SARSCOV2NAA NEGATIVE 02/13/2020   SARSCOV2NAA NEGATIVE 12/26/2019   [place] NEGATIVE 11/05/2019   [place] NEGATIVE 11/01/2019     Scheduled medications:   .  amoxicillin-clavulanate  1 tablet Oral Q12H  . aspirin EC  81 mg Oral Daily  . clopidogrel  75 mg Oral Daily  . DULoxetine  60 mg Oral Daily  . enoxaparin (LOVENOX) injection  40 mg Subcutaneous Daily  . feeding supplement  237 mL Oral TID BM  . multivitamin with minerals  1 tablet Oral Daily  . rosuvastatin  10 mg Oral Daily           Antibiotics: Anti-infectives (From admission, onward)   Start     Dose/Rate Route Frequency Ordered Stop   02/13/20 0000  amoxicillin-clavulanate (AUGMENTIN) 875-125 MG tablet        1 tablet Oral Every 12 hours 02/13/20 1316 02/14/20 2359   02/05/20 1430  amoxicillin-clavulanate (AUGMENTIN) 875-125 MG per tablet 1 tablet        1 tablet Oral Every 12 hours 02/05/20 1338 02/17/20 0959   01/12/20 1400  metroNIDAZOLE (FLAGYL) tablet 500 mg  Status:  Discontinued        500 mg Oral Every 8 hours 01/12/20 1111 02/05/20 1337   01/12/20 1300  cefTRIAXone (ROCEPHIN) 2 g in sodium chloride 0.9 % 100 mL IVPB  Status:  Discontinued        2 g 200 mL/hr over 30 Minutes Intravenous Every 24 hours 01/12/20 1111 02/05/20 1337   01/02/20 2330  piperacillin-tazobactam (ZOSYN) IVPB 3.375 g  Status:  Discontinued        3.375 g 12.5 mL/hr over 240 Minutes Intravenous Every 8 hours 01/02/20 2242 01/12/20 1111       DVT prophylaxis: Lovenox  Code Status: DNR  Family Communication: No family at bedside   Consultants:  IR  Infectious disease  Procedures:  Chest tube placement    Objective  Vitals:   02/15/20 0400 02/15/20 1452 02/15/20 1952 02/16/20 0356  BP: 115/80 130/67 125/70 139/84  Pulse: 64 69 67 62  Resp: 16 16 16 18   Temp: 98.2 F (36.8 C) 98.4 F (36.9 C) 98.2 F (36.8 C) 97.6 F (36.4 C)  TempSrc: Oral Oral Oral Oral  SpO2: 100% 100% 100% 99%  Weight:      Height:        Intake/Output Summary (Last 24 hours) at 02/16/2020 1318 Last data filed at 02/16/2020 0500 Gross per 24 hour  Intake 220 ml  Output 850 ml  Net -630 ml     01/14 1901 - 01/16 0700 In: 440 [P.O.:440] Out: 1250 [Urine:1250]  Filed Weights   01/03/20 1414  Weight: 66.8 kg    Physical Examination:   General-appears in no acute distress Heart-S1-S2, regular, no murmur auscultated Lungs-clear to auscultation bilaterally, no wheezing or crackles auscultated Abdomen-soft, nontender, no organomegaly Extremities-no edema in the lower extremities Neuro-alert, oriented x3, no focal deficit noted  Status is: Inpatient  Dispo: The patient is from: Home              Anticipated d/c is to: Skilled nursing facility              Anticipated d/c date is: 02/20/2020              Patient currently not stable for discharge  Barrier to discharge-awaiting bed at skilled nursing facility  Pressure Injury 02/12/20 Sacrum Mid Stage 2 -  Partial thickness loss of dermis presenting as a shallow open injury with a red, pink wound bed without slough. (Active)  02/12/20 1930  Location: Sacrum  Location Orientation: Mid  Staging: Stage 2 -  Partial thickness loss of dermis presenting as a shallow open injury with a red, pink wound bed without slough.  Wound Description (Comments):   Present on Admission:            Data Reviewed:   Recent Results (from the past 240 hour(s))  SARS CORONAVIRUS 2 (TAT 6-24 HRS) Nasopharyngeal Nasopharyngeal Swab     Status: None   Collection Time: 02/13/20 11:42 AM   Specimen: Nasopharyngeal Swab  Result Value Ref Range Status   SARS Coronavirus 2 NEGATIVE NEGATIVE Final    Comment: (NOTE) SARS-CoV-2 target nucleic acids are NOT DETECTED.  The SARS-CoV-2 RNA is generally detectable in upper and lower respiratory specimens during the acute phase of infection. Negative results do not preclude SARS-CoV-2 infection, do not rule out co-infections with other pathogens, and should not be used as the sole basis for treatment or other patient management decisions. Negative results must be combined with clinical  observations, patient history, and epidemiological information. The expected result is Negative.  Fact Sheet for Patients: SugarRoll.be  Fact Sheet for Healthcare Providers: https://www.woods-mathews.com/  This test is not yet approved or cleared by the [place] FDA and  has been authorized for detection and/or diagnosis of SARS-CoV-2 by FDA under an Emergency Use Authorization (EUA). This EUA will remain  in effect (meaning this test can be used) for the duration of the COVID-19 declaration under Se ction 564(b)(1) of the Act, 21 U.S.C. section 360bbb-3(b)(1), unless the authorization is terminated or revoked sooner.  Performed at [facility], [place] [address] [place], [place] [address]     BNP (last 3 results) Recent Labs    12/26/19 1510 01/01/20 1018  BNP 88.7 177.1*        [place]  [facility] If 7PM-7AM, please contact night-coverage at [email or web address], Office  [phone]   02/16/2020, 1:18 PM  LOS: 45 days

## 2020-02-17 NOTE — Plan of Care (Signed)
  Problem: Education: Goal: Knowledge of General Education information will improve Description: Including pain rating scale, medication(s)/side effects and non-pharmacologic comfort measures Outcome: Progressing   Problem: Clinical Measurements: Goal: Ability to maintain clinical measurements within normal limits will improve Outcome: Progressing Goal: Will remain free from infection Outcome: Progressing Goal: Diagnostic test results will improve Outcome: Progressing Goal: Respiratory complications will improve Outcome: Progressing Goal: Cardiovascular complication will be avoided Outcome: Progressing   Problem: Pain Managment: Goal: General experience of comfort will improve Outcome: Progressing   Problem: Skin Integrity: Goal: Risk for impaired skin integrity will decrease Outcome: Progressing   Problem: Fluid Volume: Goal: Hemodynamic stability will improve Outcome: Progressing   Problem: Clinical Measurements: Goal: Diagnostic test results will improve Outcome: Progressing Goal: Signs and symptoms of infection will decrease Outcome: Progressing   Problem: Respiratory: Goal: Ability to maintain adequate ventilation will improve Outcome: Progressing   Problem: Education: Goal: Knowledge of General Education information will improve Description: Including pain rating scale, medication(s)/side effects and non-pharmacologic comfort measures Outcome: Progressing   Problem: Health Behavior/Discharge Planning: Goal: Ability to manage health-related needs will improve Outcome: Progressing   Problem: Clinical Measurements: Goal: Ability to maintain clinical measurements within normal limits will improve Outcome: Progressing Goal: Will remain free from infection Outcome: Progressing Goal: Diagnostic test results will improve Outcome: Progressing Goal: Respiratory complications will improve Outcome: Progressing Goal: Cardiovascular complication will be  avoided Outcome: Progressing   Problem: Activity: Goal: Risk for activity intolerance will decrease Outcome: Progressing   Problem: Nutrition: Goal: Adequate nutrition will be maintained Outcome: Progressing   Problem: Coping: Goal: Level of anxiety will decrease Outcome: Progressing   Problem: Elimination: Goal: Will not experience complications related to bowel motility Outcome: Progressing Goal: Will not experience complications related to urinary retention Outcome: Progressing   Problem: Pain Managment: Goal: General experience of comfort will improve Outcome: Progressing   Problem: Safety: Goal: Ability to remain free from injury will improve Outcome: Progressing   Problem: Skin Integrity: Goal: Risk for impaired skin integrity will decrease Outcome: Progressing   

## 2020-02-17 NOTE — Care Management Important Message (Signed)
Important Message  Patient Details  Name: [name] [name] MRN: [id number] Date of Birth: [birth year]   Medicare Important Message Given:  Yes - Important Message mailed due to current National Emergency  Verbal consent obtained due to current National Emergency  Relationship to patient: Self Contact Name: Justice Milliron Call Date: 02/17/20  Time: 1526 Phone: [phone] Outcome: No Answer/Busy Important Message mailed to: Patient address on file    Delorse Lek 02/17/2020, 3:27 PM

## 2020-02-17 NOTE — Progress Notes (Signed)
Called Sister back, no answer.

## 2020-02-17 NOTE — Progress Notes (Addendum)
[facility] PROGRESS NOTE  [name] [name] ZOX:[id number] DOB: May 27, 1940 DOA: 01/02/2020 PCP: [name], [name], MD  Status: Remains inpatient appropriate because:Unsafe d/c plan   Dispo:  Patient From:  Private residence  Planned Disposition:  SNF  Expected discharge date: 02/15/2020  Medically stable for discharge:   barriers to discharge: Needs SNF placement but Medicaid application in process and as of 1/12 reportedly has been approved.  [facility] had tentatively accepted patient but has had difficulty in family working with them and may need partial payment until [facility] approved.  Patient sister not willing to sign patient in for fear of being responsible for bill.  TOC has been unable to contact son.  Unconfirmed reports that patient received a sum of money that the son now has access to and may not be spending appropriately including on needs for patient.  [facility] director Mr. [name] made aware in the event we need to pursue guardianship for this patient.  Because of these concerns nurse case manager has contacted [employer] as of 1/13.   Code Status: DNR Family Communication: Son [name] 1/07-TOC has made repeated calls to patient's son on 1/12, 1/13 and 1/14 without any response; TOC spoke with patient's sister on 1/13 DVT prophylaxis: Lovenox Vaccination status: 1/10 patient given first dose of Pfizer COVID-vaccine  Foley catheter:  No   HPI: 80 year old male with history of recent CVA in 11/2019 with residual dysarthria, hypertension, hyperlipidemia and spinal stenosis, initially presented to [facility] from [facility] SNF with confusion and difficulty breathing. Work-up revealed right-sided loculated pleural effusion with concern for empyema. IR at [facility] attempted ultrasound-guided chest tube placement but were unable due to thickness of pleural fluid. He was evaluated by general surgery at [facility] who recommended transfer to [facility] for thoracic surgery evaluation.  Patient was evaluated by thoracic surgery who recommended chest tube placement by IR, that was performed on 01/03/2020. ID consulted and recommended transitioning from IV Zosyn to IV ceftriaxone and oral Flagyl while in-house and discharge on p.o. Augmentin for a total of 6 weeks with start date of 01/03/2020. Right-sided chest tube removed 12/16. TCTS signed off 12/17 and advised no outpatient follow-up needed.  Patient stable for discharge.  PT/OT recommending SNF.  Patient will need long-term Medicaid in place before can discharge.  Subjective: Sitting up in chair eating breakfast.  No specific complaints verbalized.  No specific requests  Objective: Vitals:   02/16/20 1939 02/17/20 0423  BP: 124/71 137/75  Pulse: 70 67  Resp: 18 17  Temp: 97.6 F (36.4 C) 97.6 F (36.4 C)  SpO2: 99% 99%    Intake/Output Summary (Last 24 hours) at 02/17/2020 0809 Last data filed at 02/17/2020 0540 Gross per 24 hour  Intake 520 ml  Output 1650 ml  Net -1130 ml   Filed Weights   01/03/20 1414  Weight: 66.8 kg    Exam: Constitutional: Alert in no acute distress, calm Respiratory: Anterior lung sounds clear, normal respiratory rate, stable on room air Cardiovascular: Normal heart sounds S1-S2, no tachycardia, extremities warm to touch with adequate capillary refill Abdomen:  LBM 1/17 Neurologic: CN 2-12 grossly intact. Sensation intact,  Strength 5/5 x on left slightly weaker 4/5 on the right Psychiatric: Alert and oriented x name only. Flat affect   Assessment/Plan: Acute problems: Acute hypoxemic respiratory failure  2/2 right-sided parapneumonic effusion, empyema  **SEPSIS RULED OUT** -Hypoxemia (resolved)-TCTS consulted -s/p pigtail chest tube on 01/03/2020 and was removed on 12/16. -Follow-up chest x-ray 12/17 without pneumothorax.   -  Thoracic surgery signed off 12/17, prn OP follow-up recommend  -Continue antibiotics (currently on ceftriaxone and metronidazole initially was treated with  IV Zosyn beginning on 12/3) for a total of 6 weeks -today is last day for antibiotics-LD due 02/17/2020.  1/5: discussed with ID- transitioned to Augmentin  Physical deconditioning/gait disturbance -PT and OT recommending SNF. -Evaluated today with documentation that he is very motivated but did asked to go back to bed after walking.  Encourage more frequent out of bed so he will build endurance.  Patient finally encouraged to stay up in chair. -PT is also documented slow shuffling steps while mobilizing  Hypertension -Controlled off antihypertensive medication  Severe protein calorie malnutrition Nutrition Problem: Severe Malnutrition Etiology: chronic illness (CVA) Signs/Symptoms: moderate fat depletion,severe fat depletion,moderate muscle depletion,severe muscle depletion Interventions: Ensure Enlive (each supplement provides 350kcal and 20 grams of protein),Magic cup,MVI -As of 12/23 patient able to feed self and eating meals well (between 50% and 100% of meals over the past 24 hours) Estimated body mass index is 21.13 kg/m as calculated from the following:   Height as of this encounter: 5\' 10"  (1.778 m).   Weight as of this encounter: 66.8 kg.   Wounds: Incision (Closed) 01/03/20 (Active)  Date First Assessed/Time First Assessed: 01/03/20 2000      Assessments 01/03/2020  8:00 PM 02/06/2020  8:00 PM  Dressing Type - None  Dressing Dry;Clean;Intact Clean;Dry;Intact  Dressing Change Frequency - PRN  Site / Wound Assessment Dressing in place / Unable to assess Dry;Clean  Drainage Description Serosanguineous -     No Linked orders to display     Pressure Injury 02/12/20 Sacrum Mid Stage 2 -  Partial thickness loss of dermis presenting as a shallow open injury with a red, pink wound bed without slough. (Active)  Date First Assessed/Time First Assessed: 02/12/20 1930   Location: Sacrum  Location Orientation: Mid  Staging: Stage 2 -  Partial thickness loss of dermis presenting as a  shallow open injury with a red, pink wound bed without slough.    Assessments 02/12/2020  9:23 PM 02/16/2020  7:45 PM  Dressing Type Foam - Lift dressing to assess site every shift Foam - Lift dressing to assess site every shift  Dressing Changed -  Site / Wound Assessment Clean;Dry -  Drainage Amount None -     No Linked orders to display     Other problems: Acute kidney injury -Resolved -Current creatinine 1.01 with a GFR greater than 60  History of CVA/dementia without behavioral disturbances -Remains normotensive off of BP medication -Continue Plavix and low-dose aspirin -Continue statin/Crestor -Continue Cymbalta  Anemia of chronic disease -Hemoglobin stable around 10   Data Reviewed: Basic Metabolic Panel: No results for input(s): NA, K, CL, CO2, GLUCOSE, BUN, CREATININE, CALCIUM, MG, PHOS in the last 168 hours. Liver Function Tests: No results for input(s): AST, ALT, ALKPHOS, BILITOT, PROT, ALBUMIN in the last 168 hours. No results for input(s): LIPASE, AMYLASE in the last 168 hours. No results for input(s): AMMONIA in the last 168 hours. CBC: No results for input(s): WBC, NEUTROABS, HGB, HCT, MCV, PLT in the last 168 hours. Cardiac Enzymes: No results for input(s): CKTOTAL, CKMB, CKMBINDEX, TROPONINI in the last 168 hours. BNP (last 3 results) Recent Labs    12/26/19 1510 01/01/20 1018  BNP 88.7 177.1*    ProBNP (last 3 results) No results for input(s): PROBNP in the last 8760 hours.  CBG: No results for input(s): GLUCAP in the last 168 hours.  Recent Results (from the past 240 hour(s))  SARS CORONAVIRUS 2 (TAT 6-24 HRS) Nasopharyngeal Nasopharyngeal Swab     Status: None   Collection Time: 02/13/20 11:42 AM   Specimen: Nasopharyngeal Swab  Result Value Ref Range Status   SARS Coronavirus 2 NEGATIVE NEGATIVE Final    Comment: (NOTE) SARS-CoV-2 target nucleic acids are NOT DETECTED.  The SARS-CoV-2 RNA is generally detectable in upper and  lower respiratory specimens during the acute phase of infection. Negative results do not preclude SARS-CoV-2 infection, do not rule out co-infections with other pathogens, and should not be used as the sole basis for treatment or other patient management decisions. Negative results must be combined with clinical observations, patient history, and epidemiological information. The expected result is Negative.  Fact Sheet for Patients: SugarRoll.be  Fact Sheet for Healthcare Providers: https://www.woods-mathews.com/  This test is not yet approved or cleared by the [place] FDA and  has been authorized for detection and/or diagnosis of SARS-CoV-2 by FDA under an Emergency Use Authorization (EUA). This EUA will remain  in effect (meaning this test can be used) for the duration of the COVID-19 declaration under Se ction 564(b)(1) of the Act, 21 U.S.C. section 360bbb-3(b)(1), unless the authorization is terminated or revoked sooner.  Performed at [facility], [place] [address] [place], [place] [address]      Studies: No results found.  Scheduled Meds: . aspirin EC  81 mg Oral Daily  . clopidogrel  75 mg Oral Daily  . DULoxetine  60 mg Oral Daily  . enoxaparin (LOVENOX) injection  40 mg Subcutaneous Daily  . feeding supplement  237 mL Oral TID BM  . multivitamin with minerals  1 tablet Oral Daily  . rosuvastatin  10 mg Oral Daily   Continuous Infusions:   Active Problems:   Empyema lung ([place])   Severe sepsis without septic shock ([place])   History of CVA in adulthood   Physical deconditioning   Dementia associated with other underlying disease without behavioral disturbance ([place])   Pressure injury of skin   Consultants:  TCTS  Conventional radiology  Procedures:  Chest tube placement 12/3 by IR  Antibiotics: Anti-infectives (From admission, onward)   Start     Dose/Rate Route Frequency Ordered Stop   02/13/20 0000   amoxicillin-clavulanate (AUGMENTIN) 875-125 MG tablet        1 tablet Oral Every 12 hours 02/13/20 1316 02/14/20 2359   02/05/20 1430  amoxicillin-clavulanate (AUGMENTIN) 875-125 MG per tablet 1 tablet        1 tablet Oral Every 12 hours 02/05/20 1338 02/16/20 2105   01/12/20 1400  metroNIDAZOLE (FLAGYL) tablet 500 mg  Status:  Discontinued        500 mg Oral Every 8 hours 01/12/20 1111 02/05/20 1337   01/12/20 1300  cefTRIAXone (ROCEPHIN) 2 g in sodium chloride 0.9 % 100 mL IVPB  Status:  Discontinued        2 g 200 mL/hr over 30 Minutes Intravenous Every 24 hours 01/12/20 1111 02/05/20 1337   01/02/20 2330  piperacillin-tazobactam (ZOSYN) IVPB 3.375 g  Status:  Discontinued        3.375 g 12.5 mL/hr over 240 Minutes Intravenous Every 8 hours 01/02/20 2242 01/12/20 1111       Time spent: 20 minutes    Erin Hearing ANP  [facility] 7 am- 330 pm/M-F for direct patient care and secure chat Please refer to Amion for contact information 46 days

## 2020-02-17 NOTE — Progress Notes (Signed)
Physical Therapy Treatment Patient Details Name: [name] [name] MRN: [id number] DOB: 05-25-1940 Today's Date: 02/17/2020    History of Present Illness 80 year old male with history of recent CVA in 11/2019 with residual dysarthria, hypertension, hyperlipidemia and spinal stenosis, initially presented to [facility] from [facility] SNF with confusion and difficulty breathing.  Work-up revealed right-sided loculated pleural effusion with concern for empyema. Transfer to [facility]. Patient was evaluated by thoracic surgery who recommended chest tube placement by IR, that was performed on 01/03/2020.  ID consulted and recommended transitioning from IV Zosyn to IV ceftriaxone and oral Flagyl while in-house and discharge on p.o. Augmentin for a total of 6 weeks with start date of 01/03/2020. Right-sided chest tube removed 12/16.    PT Comments    Pt was seen for mobility on RW with pt requiring mod assist to stand and mod to control standing at times.  Pt is having continual incontinence of bowels during standing and so did not venture far from the chair.  Has tolerated and followed instructions for LE ex's.  Follow acutely for goals of PT and continue to recommend SNF to work on LE strength and standing endurance.   Follow Up Recommendations  SNF     Equipment Recommendations  Rolling walker with 5" wheels;3in1 (PT)    Recommendations for Other Services       Precautions / Restrictions Precautions Precautions: Fall Precaution Comments: incontinent Restrictions Weight Bearing Restrictions: No    Mobility  Bed Mobility Overal bed mobility: Needs Assistance             General bed mobility comments: up in chair at time of PT arrival  Transfers Overall transfer level: Needs assistance Equipment used: Rolling walker (2 wheeled);1 person hand held assist Transfers: Sit to/from Stand Sit to Stand: Mod assist         General transfer comment: mod assist with repetition of cues for hand  placement  Ambulation/Gait             General Gait Details: steps in region of chair   Stairs             Wheelchair Mobility    Modified Rankin (Stroke Patients Only)       Balance Overall balance assessment: Needs assistance Sitting-balance support: Feet supported Sitting balance-Leahy Scale: Fair     Standing balance support: Bilateral upper extremity supported;During functional activity Standing balance-Leahy Scale: Poor                              Cognition Arousal/Alertness: Lethargic Behavior During Therapy: Flat affect Overall Cognitive Status: History of cognitive impairments - at baseline Area of Impairment: Orientation;Attention;Memory;Following commands;Safety/judgement;Awareness;Problem solving                 Orientation Level: Disoriented to;Situation;Place Current Attention Level: Selective Memory: Decreased recall of precautions;Decreased short-term memory Following Commands: Follows one step commands with increased time Safety/Judgement: Decreased awareness of safety;Decreased awareness of deficits Awareness: Intellectual Problem Solving: Slow processing;Requires verbal cues;Requires tactile cues;Difficulty sequencing General Comments: slow initiation and processing speed      Exercises General Exercises - Lower Extremity Ankle Circles/Pumps: AAROM;5 reps Long Arc Quad: Strengthening;10 reps Heel Slides: Strengthening;10 reps Hip ABduction/ADduction: Strengthening;10 reps    General Comments General comments (skin integrity, edema, etc.): pt is continually having a loose bowel movement during session and so could not walk far fromthe chair.  He is requiring hand over hand cues for sequencing STS  and to control descent to chair, and is down on the chair from sliding when PT arrived.      Pertinent Vitals/Pain Pain Assessment: No/denies pain    Home Living                      Prior Function             PT Goals (current goals can now be found in the care plan section) Acute Rehab PT Goals Patient Stated Goal: to get comfortable    Frequency    Min 2X/week      PT Plan Current plan remains appropriate    Co-evaluation              AM-PAC PT "6 Clicks" Mobility   Outcome Measure  Help needed turning from your back to your side while in a flat bed without using bedrails?: A Little Help needed moving from lying on your back to sitting on the side of a flat bed without using bedrails?: A Lot Help needed moving to and from a bed to a chair (including a wheelchair)?: A Lot Help needed standing up from a chair using your arms (e.g., wheelchair or bedside chair)?: A Lot Help needed to walk in hospital room?: A Lot Help needed climbing 3-5 steps with a railing? : A Lot 6 Click Score: 13    End of Session Equipment Utilized During Treatment: Gait belt Activity Tolerance: Patient tolerated treatment well Patient left: in chair;with call bell/phone within reach;with chair alarm set Nurse Communication: Mobility status PT Visit Diagnosis: Difficulty in walking, not elsewhere classified (R26.2);Muscle weakness (generalized) (M62.81)     Time: 6606-3016 PT Time Calculation (min) (ACUTE ONLY): 27 min  Charges:  $Therapeutic Exercise: 8-22 mins $Therapeutic Activity: 8-22 mins                   Ramond Dial 02/17/2020, 12:44 PM  Mee Hives, PT MS Acute Rehab Dept. Number: [place] [facility] [place]

## 2020-02-18 NOTE — Plan of Care (Signed)
  Problem: Education: Goal: Knowledge of General Education information will improve Description: Including pain rating scale, medication(s)/side effects and non-pharmacologic comfort measures Outcome: Progressing   Problem: Clinical Measurements: Goal: Ability to maintain clinical measurements within normal limits will improve Outcome: Progressing Goal: Will remain free from infection Outcome: Progressing Goal: Diagnostic test results will improve Outcome: Progressing Goal: Respiratory complications will improve Outcome: Progressing Goal: Cardiovascular complication will be avoided Outcome: Progressing   Problem: Pain Managment: Goal: General experience of comfort will improve Outcome: Progressing   Problem: Skin Integrity: Goal: Risk for impaired skin integrity will decrease Outcome: Progressing   Problem: Fluid Volume: Goal: Hemodynamic stability will improve Outcome: Progressing   Problem: Clinical Measurements: Goal: Diagnostic test results will improve Outcome: Progressing Goal: Signs and symptoms of infection will decrease Outcome: Progressing   Problem: Respiratory: Goal: Ability to maintain adequate ventilation will improve Outcome: Progressing   Problem: Education: Goal: Knowledge of General Education information will improve Description: Including pain rating scale, medication(s)/side effects and non-pharmacologic comfort measures Outcome: Progressing   Problem: Health Behavior/Discharge Planning: Goal: Ability to manage health-related needs will improve Outcome: Progressing   Problem: Clinical Measurements: Goal: Ability to maintain clinical measurements within normal limits will improve Outcome: Progressing Goal: Will remain free from infection Outcome: Progressing Goal: Diagnostic test results will improve Outcome: Progressing Goal: Respiratory complications will improve Outcome: Progressing Goal: Cardiovascular complication will be  avoided Outcome: Progressing   Problem: Activity: Goal: Risk for activity intolerance will decrease Outcome: Progressing   Problem: Nutrition: Goal: Adequate nutrition will be maintained Outcome: Progressing   Problem: Coping: Goal: Level of anxiety will decrease Outcome: Progressing   Problem: Elimination: Goal: Will not experience complications related to bowel motility Outcome: Progressing Goal: Will not experience complications related to urinary retention Outcome: Progressing   Problem: Pain Managment: Goal: General experience of comfort will improve Outcome: Progressing   Problem: Safety: Goal: Ability to remain free from injury will improve Outcome: Progressing   Problem: Skin Integrity: Goal: Risk for impaired skin integrity will decrease Outcome: Progressing   

## 2020-02-18 NOTE — TOC Progression Note (Signed)
Transition of Care [facility]) - Progression Note    Patient Details  Name: [name] [name] MRN: [id number] Date of Birth: 01-26-1941  Transition of Care [facility]) CM/SW [place], RN Phone Number: 02/18/2020, 1:50 PM  Clinical Narrative:    CM spoke with Julien Girt, MSW with  DSS and she is calling to follow up with family regarding taking responsibility for the patient to agree to signing the patient in to a skilled nursing facility as his guardian.  Rico Junker, CSW will return my call with follow up information regarding this matter so that CM and MSW can find an accepting [facility] facility for the patient for admission.   Expected Discharge Plan: Long Term Nursing Home Barriers to Discharge:  (Waiting on approval for [place] Medicaid)  Expected Discharge Plan and Services Expected Discharge Plan: [place]   Discharge Planning Services: CM Consult Post Acute Care Choice: [place] Living arrangements for the past 2 months: [place]                                       Social Determinants of Health (SDOH) Interventions    Readmission Risk Interventions No flowsheet data found.

## 2020-02-18 NOTE — Progress Notes (Signed)
Occupational Therapy Treatment Patient Details Name: [name] [name] MRN: [id number] DOB: 1940/03/04 Today's Date: 02/18/2020    History of present illness 80 year old male with history of recent CVA in 11/2019 with residual dysarthria, hypertension, hyperlipidemia and spinal stenosis, initially presented to [facility] from [facility] SNF with confusion and difficulty breathing.  Work-up revealed right-sided loculated pleural effusion with concern for empyema. Transfer [facility] Patient was evaluated by thoracic surgery who recommended chest tube placement by IR, that was performed on 01/03/2020.  ID consulted and recommended transitioning from IV Zosyn to IV ceftriaxone and oral Flagyl while in-house and discharge on p.o. Augmentin for a total of 6 weeks with start date of 01/03/2020. Right-sided chest tube removed 12/16.   OT comments  Pt making progress with functional goals. OT will continue to follow acutely to maximize level of function and safety  Follow Up Recommendations  SNF;Supervision/Assistance - 24 hour    Equipment Recommendations  None recommended by OT    Recommendations for Other Services      Precautions / Restrictions Precautions Precautions: Fall Restrictions Weight Bearing Restrictions: No       Mobility Bed Mobility Overal bed mobility: Needs Assistance Bed Mobility: Supine to Sit     Supine to sit: Min assist        Transfers Overall transfer level: Needs assistance Equipment used: Rolling walker (2 wheeled);1 person hand held assist Transfers: Sit to/from Stand Sit to Stand: Mod assist         General transfer comment: mod assist with repetition of cues for hand placement    Balance Overall balance assessment: Needs assistance Sitting-balance support: Feet supported Sitting balance-Leahy Scale: Fair Sitting balance - Comments: some R lean, needs cues for maintaining upright posture at times, able to don mask seated while unsupported no LOB    Standing balance support: Bilateral upper extremity supported;During functional activity Standing balance-Leahy Scale: Poor                             ADL either performed or assessed with clinical judgement   ADL Overall ADL's : Needs assistance/impaired Eating/Feeding: Set up Eating/Feeding Details (indicate cue type and reason): assisted to open containers, apply condiments, cut food Grooming: Wash/dry hands;Wash/dry face;Minimal assistance;Standing           Upper Body Dressing : Min guard;Sitting Upper Body Dressing Details (indicate cue type and reason): donned clean gown seated EOB     Toilet Transfer: RW;Moderate assistance;BSC;Ambulation;Cueing for safety;Cueing for sequencing   Toileting- Clothing Manipulation and Hygiene: Total assistance;Sit to/from stand       Functional mobility during ADLs: Rolling walker;Moderate assistance;Cueing for safety;Cueing for sequencing General ADL Comments: pt continues to present with decreased activity tolerance, generalized weakness and impaired balance     Vision Patient Visual Report: No change from baseline     Perception     Praxis      Cognition Arousal/Alertness: Awake/alert Behavior During Therapy: Flat affect Overall Cognitive Status: History of cognitive impairments - at baseline Area of Impairment: Orientation;Attention;Memory;Following commands;Safety/judgement;Awareness;Problem solving                     Memory: Decreased recall of precautions;Decreased short-term memory Following Commands: Follows one step commands with increased time Safety/Judgement: Decreased awareness of safety;Decreased awareness of deficits   Problem Solving: Slow processing;Requires verbal cues;Requires tactile cues;Difficulty sequencing General Comments: slow initiation and processing speed        Exercises  Shoulder Instructions       General Comments      Pertinent Vitals/ Pain       Pain  Assessment: No/denies pain Faces Pain Scale: Hurts a little bit Pain Intervention(s): Monitored during session;Repositioned  Home Living                                          Prior Functioning/Environment              Frequency  Min 2X/week        Progress Toward Goals  OT Goals(current goals can now be found in the care plan section)  Progress towards OT goals: Progressing toward goals     Plan Discharge plan remains appropriate;Frequency remains appropriate    Co-evaluation                 AM-PAC OT "6 Clicks" Daily Activity     Outcome Measure   Help from another person eating meals?: A Little Help from another person taking care of personal grooming?: A Little Help from another person toileting, which includes using toliet, bedpan, or urinal?: Total Help from another person bathing (including washing, rinsing, drying)?: A Lot Help from another person to put on and taking off regular upper body clothing?: A Little Help from another person to put on and taking off regular lower body clothing?: Total 6 Click Score: 13    End of Session Equipment Utilized During Treatment: Gait belt;Rolling walker  OT Visit Diagnosis: Unsteadiness on feet (R26.81);Other abnormalities of gait and mobility (R26.89);Muscle weakness (generalized) (M62.81);Other symptoms and signs involving cognitive function   Activity Tolerance Patient tolerated treatment well   Patient Left in chair;with call bell/phone within reach;with chair alarm set   Nurse Communication          Time: [phone] OT Time Calculation (min): 26 min  Charges: OT General Charges $OT Visit: 1 Visit OT Treatments $Self Care/Home Management : 8-22 mins $Therapeutic Activity: 8-22 mins     Britt Bottom 02/18/2020, 3:16 PM

## 2020-02-18 NOTE — Progress Notes (Signed)
[facility] PROGRESS NOTE  [name] [name] ZOX:[id number] DOB: [birth year] DOA: 01/02/2020 PCP: [name], [name], MD  Status: Remains inpatient appropriate because:Unsafe d/c plan   Dispo:  Patient From:  Private residence  Planned Disposition:  SNF  Expected discharge date: 02/15/2020  Medically stable for discharge:   barriers to discharge: Needs SNF placement but Medicaid application in process and as of 1/12 reportedly has been approved.  [facility] had tentatively accepted patient but has had difficulty in family working with them and may need partial payment until [facility] approved.  Patient sister not willing to sign patient in for fear of being responsible for bill.  TOC has been unable to contact son.  Unconfirmed reports that patient received a sum of money that the son now has access to and may not be spending appropriately including on needs for patient.  [facility] director Mr. [name] made aware in the event we need to pursue guardianship for this patient.  Because of these concerns nurse case manager has contacted APS as of 1/13.   Code Status: DNR Family Communication: Son [name] 1/07-TOC has made repeated calls to patient's son on 1/12, 1/13 and 1/14 without any response; TOC spoke with patient's sister on 1/13 DVT prophylaxis: Lovenox Vaccination status: 1/10 patient given first dose of Pfizer COVID-vaccine  Foley catheter:  No   HPI: 80 year old male with history of recent CVA in 11/2019 with residual dysarthria, hypertension, hyperlipidemia and spinal stenosis, initially presented to [facility] from [facility] SNF with confusion and difficulty breathing. Work-up revealed right-sided loculated pleural effusion with concern for empyema. IR at [facility] attempted ultrasound-guided chest tube placement but were unable due to thickness of pleural fluid. He was evaluated by general surgery at [facility] who recommended transfer to [facility] for thoracic surgery evaluation.  Patient was evaluated by thoracic surgery who recommended chest tube placement by IR, that was performed on 01/03/2020. ID consulted and recommended transitioning from IV Zosyn to IV ceftriaxone and oral Flagyl while in-house and discharge on p.o. Augmentin for a total of 6 weeks with start date of 01/03/2020. Right-sided chest tube removed 12/16. TCTS signed off 12/17 and advised no outpatient follow-up needed.  Patient stable for discharge.  PT/OT recommending SNF.  Patient will need long-term Medicaid in place before can discharge.  Subjective: Awake, sitting up in bed eating breakfast.  Not as talkative as yesterday.  No complaints or requests when asked  Objective: Vitals:   02/17/20 1948 02/18/20 0354  BP: 127/79 129/74  Pulse: 68 (!) 59  Resp: 17 17  Temp: 98.1 F (36.7 C) (!) 97.5 F (36.4 C)  SpO2: 99% 99%    Intake/Output Summary (Last 24 hours) at 02/18/2020 0734 Last data filed at 02/18/2020 0350 Gross per 24 hour  Intake 600 ml  Output 600 ml  Net 0 ml   Filed Weights   01/03/20 1414  Weight: 66.8 kg    Exam: Constitutional: No acute distress, sitting up in bed eating breakfast Respiratory: Bilateral lung sounds clear to auscultation anteriorly, no increased work of breathing, stable on room air Cardiovascular: Normal heart sounds S1-S2, pulses regular, extremities warm to touch with adequate capillary refill Abdomen: Nontender nondistended.  Eating breakfast slowly.  LBM 1/17 Neurologic: CN 2-12 grossly intact. Sensation intact,  Strength 5/5 x on left slightly weaker 4/5 on the right Psychiatric: Alert and oriented x name only. Flat affect   Assessment/Plan: Acute problems: Acute hypoxemic respiratory failure  2/2 right-sided parapneumonic effusion, empyema  **SEPSIS RULED OUT** -  Hypoxemia (resolved)-TCTS consulted -s/p pigtail chest tube on 01/03/2020 and was removed on 12/16. -Follow-up chest x-ray 12/17 without pneumothorax.   -Thoracic surgery signed off  12/17, prn OP follow-up recommend  -Continue antibiotics (currently on ceftriaxone and metronidazole initially was treated with IV Zosyn beginning on 12/3) and subsequently transitioned to Augmentin for a total of 6 weeks of treatment-completed antibiotics on 02/17/2020.   Physical deconditioning/gait disturbance -PT and OT recommending SNF. -Evaluated today with documentation that he is very motivated but did asked to go back to bed after walking.  Encourage more frequent out of bed so he will build endurance.  Patient finally encouraged to stay up in chair. -PT is also documented slow shuffling steps while mobilizing  Hypertension -Controlled off antihypertensive medication  Severe protein calorie malnutrition Nutrition Problem: Severe Malnutrition Etiology: chronic illness (CVA) Signs/Symptoms: moderate fat depletion,severe fat depletion,moderate muscle depletion,severe muscle depletion Interventions: Ensure Enlive (each supplement provides 350kcal and 20 grams of protein),Magic cup,MVI -As of 12/23 patient able to feed self and eating meals well (between 50% and 100% of meals over the past 24 hours) Estimated body mass index is 21.13 kg/m as calculated from the following:   Height as of this encounter: 5\' 10"  (1.778 m).   Weight as of this encounter: 66.8 kg.   Wounds: Incision (Closed) 01/03/20 (Active)  Date First Assessed/Time First Assessed: 01/03/20 2000      Assessments 01/03/2020  8:00 PM 02/06/2020  8:00 PM  Dressing Type - None  Dressing Dry;Clean;Intact Clean;Dry;Intact  Dressing Change Frequency - PRN  Site / Wound Assessment Dressing in place / Unable to assess Dry;Clean  Drainage Description Serosanguineous -     No Linked orders to display     Pressure Injury 02/12/20 Sacrum Mid Stage 2 -  Partial thickness loss of dermis presenting as a shallow open injury with a red, pink wound bed without slough. (Active)  Date First Assessed/Time First Assessed: 02/12/20 1930    Location: Sacrum  Location Orientation: Mid  Staging: Stage 2 -  Partial thickness loss of dermis presenting as a shallow open injury with a red, pink wound bed without slough.    Assessments 02/12/2020  9:23 PM 02/16/2020  7:45 PM  Dressing Type Foam - Lift dressing to assess site every shift Foam - Lift dressing to assess site every shift  Dressing Changed -  Site / Wound Assessment Clean;Dry -  Drainage Amount None -     No Linked orders to display     Other problems: Acute kidney injury -Resolved -Current creatinine 1.01 with a GFR greater than 60  History of CVA/dementia without behavioral disturbances -Remains normotensive off of BP medication -Continue Plavix and low-dose aspirin -Continue statin/Crestor -Continue Cymbalta  Anemia of chronic disease -Hemoglobin stable around 10   Data Reviewed: Basic Metabolic Panel: No results for input(s): NA, K, CL, CO2, GLUCOSE, BUN, CREATININE, CALCIUM, MG, PHOS in the last 168 hours. Liver Function Tests: No results for input(s): AST, ALT, ALKPHOS, BILITOT, PROT, ALBUMIN in the last 168 hours. No results for input(s): LIPASE, AMYLASE in the last 168 hours. No results for input(s): AMMONIA in the last 168 hours. CBC: No results for input(s): WBC, NEUTROABS, HGB, HCT, MCV, PLT in the last 168 hours. Cardiac Enzymes: No results for input(s): CKTOTAL, CKMB, CKMBINDEX, TROPONINI in the last 168 hours. BNP (last 3 results) Recent Labs    12/26/19 1510 01/01/20 1018  BNP 88.7 177.1*    ProBNP (last 3 results) No results for input(s): PROBNP  in the last 8760 hours.  CBG: No results for input(s): GLUCAP in the last 168 hours.  Recent Results (from the past 240 hour(s))  SARS CORONAVIRUS 2 (TAT 6-24 HRS) Nasopharyngeal Nasopharyngeal Swab     Status: None   Collection Time: 02/13/20 11:42 AM   Specimen: Nasopharyngeal Swab  Result Value Ref Range Status   SARS Coronavirus 2 NEGATIVE NEGATIVE Final    Comment:  (NOTE) SARS-CoV-2 target nucleic acids are NOT DETECTED.  The SARS-CoV-2 RNA is generally detectable in upper and lower respiratory specimens during the acute phase of infection. Negative results do not preclude SARS-CoV-2 infection, do not rule out co-infections with other pathogens, and should not be used as the sole basis for treatment or other patient management decisions. Negative results must be combined with clinical observations, patient history, and epidemiological information. The expected result is Negative.  Fact Sheet for Patients: SugarRoll.be  Fact Sheet for Healthcare Providers: https://www.woods-mathews.com/  This test is not yet approved or cleared by the [place] FDA and  has been authorized for detection and/or diagnosis of SARS-CoV-2 by FDA under an Emergency Use Authorization (EUA). This EUA will remain  in effect (meaning this test can be used) for the duration of the COVID-19 declaration under Se ction 564(b)(1) of the Act, 21 U.S.C. section 360bbb-3(b)(1), unless the authorization is terminated or revoked sooner.  Performed at [facility], [place] [address], [place], [place] [address]      Studies: No results found.  Scheduled Meds: . aspirin EC  81 mg Oral Daily  . clopidogrel  75 mg Oral Daily  . DULoxetine  60 mg Oral Daily  . enoxaparin (LOVENOX) injection  40 mg Subcutaneous Daily  . feeding supplement  237 mL Oral TID BM  . multivitamin with minerals  1 tablet Oral Daily  . rosuvastatin  10 mg Oral Daily   Continuous Infusions:   Active Problems:   Empyema lung ([place])   Severe sepsis without septic shock ([place])   History of CVA in adulthood   Physical deconditioning   Dementia associated with other underlying disease without behavioral disturbance ([place])   Pressure injury of skin   Consultants:  TCTS  Conventional radiology  Procedures:  Chest tube placement 12/3 by  IR  Antibiotics: Anti-infectives (From admission, onward)   Start     Dose/Rate Route Frequency Ordered Stop   02/13/20 0000  amoxicillin-clavulanate (AUGMENTIN) 875-125 MG tablet        1 tablet Oral Every 12 hours 02/13/20 1316 02/14/20 2359   02/05/20 1430  amoxicillin-clavulanate (AUGMENTIN) 875-125 MG per tablet 1 tablet        1 tablet Oral Every 12 hours 02/05/20 1338 02/16/20 2105   01/12/20 1400  metroNIDAZOLE (FLAGYL) tablet 500 mg  Status:  Discontinued        500 mg Oral Every 8 hours 01/12/20 1111 02/05/20 1337   01/12/20 1300  cefTRIAXone (ROCEPHIN) 2 g in sodium chloride 0.9 % 100 mL IVPB  Status:  Discontinued        2 g 200 mL/hr over 30 Minutes Intravenous Every 24 hours 01/12/20 1111 02/05/20 1337   01/02/20 2330  piperacillin-tazobactam (ZOSYN) IVPB 3.375 g  Status:  Discontinued        3.375 g 12.5 mL/hr over 240 Minutes Intravenous Every 8 hours 01/02/20 2242 01/12/20 1111       Time spent: 20 minutes    Erin Hearing ANP  [facility] 7 am- 330 pm/M-F for direct patient  care and secure chat Please refer to Amion for contact information 47 days

## 2020-02-18 NOTE — Plan of Care (Signed)
  Problem: Pain Managment: Goal: General experience of comfort will improve Outcome: Progressing   

## 2020-02-19 NOTE — Progress Notes (Addendum)
CSW reached out to DSS worker in attempt to obtain this patient's Medicaid number - message left for Herbie Baltimore at [place] [phone]).  CSW spoke with admissions at [facility] who agreed to review clinicals for possible admission to the facility.  Madilyn Fireman, MSW, LCSW-A Transitions of Care   Clinical Social Worker I [phone]

## 2020-02-19 NOTE — Progress Notes (Signed)
[facility] PROGRESS NOTE  [name] [name] FAO:[id number] DOB: [birth year] DOA: 01/02/2020 PCP: [name], [name], MD  Status: Remains inpatient appropriate because:Unsafe d/c plan   Dispo:  Patient From:  Private residence  Planned Disposition:  SNF  Expected discharge date: 02/15/2020  Medically stable for discharge:   barriers to discharge: Needs SNF placement but Medicaid application in process and as of 1/12 reportedly has been approved.  [facility] had tentatively accepted patient but has had difficulty in family working with them and may need partial payment until [facility] approved.  Patient sister not willing to sign patient in for fear of being responsible for bill.  TOC has been unable to contact son.  Unconfirmed reports that patient received a sum of money that the son now has access to and may not be spending appropriately including on needs for patient.  [facility] director Mr. [name] made aware in the event we need to pursue guardianship for this patient.  Because of these concerns nurse case manager has contacted APS as of 1/13.   Code Status: DNR Family Communication: Son [name] [name] 1/07-TOC has made repeated calls to patient's son on 1/12, 1/13 and 1/14 without any response; TOC spoke with patient's sister on 1/13 DVT prophylaxis: Lovenox Vaccination status: 1/10 patient given first dose of Pfizer COVID-vaccine  Foley catheter:  No   HPI: 80 year old male with history of recent CVA in 11/2019 with residual dysarthria, hypertension, hyperlipidemia and spinal stenosis, initially presented to [facility] from [facility] SNF with confusion and difficulty breathing. Work-up revealed right-sided loculated pleural effusion with concern for empyema. IR at [facility] attempted ultrasound-guided chest tube placement but were unable due to thickness of pleural fluid. He was evaluated by general surgery at [facility] who recommended transfer to [facility] for thoracic surgery evaluation.  Patient was evaluated by thoracic surgery who recommended chest tube placement by IR, that was performed on 01/03/2020. ID consulted and recommended transitioning from IV Zosyn to IV ceftriaxone and oral Flagyl while in-house and discharge on p.o. Augmentin for a total of 6 weeks with start date of 01/03/2020. Right-sided chest tube removed 12/16. TCTS signed off 12/17 and advised no outpatient follow-up needed.  Patient stable for discharge.  PT/OT recommending SNF.  Patient will need long-term Medicaid in place before can discharge.  Subjective: Awake and sitting up in bed.  Has completed breakfast.  Not as interactive today.  Objective: Vitals:   02/18/20 1957 02/19/20 0520  BP: 128/83 119/71  Pulse: 74 61  Resp: 15 16  Temp: 98.9 F (37.2 C) 98.3 F (36.8 C)  SpO2: 99% 98%    Intake/Output Summary (Last 24 hours) at 02/19/2020 0813 Last data filed at 02/19/2020 6962 Gross per 24 hour  Intake 300 ml  Output 1050 ml  Net -750 ml   Filed Weights   01/03/20 1414  Weight: 66.8 kg    Exam: Constitutional: Alert, no acute distress Respiratory: Lungs are clear bilaterally, stable on room air Cardiovascular: Normal heart sounds, pulse regular, extremities warm Abdomen: Soft nontender nondistended with normoactive bowel sounds.  Variable.  For the most part eats between 50 to 100% of meals.  LBM 1/17 Neurologic: CN 2-12 grossly intact. Sensation intact,  Strength 5/5 x on left slightly weaker 4/5 on the right Psychiatric: Alert and oriented x name only. Flat affect   Assessment/Plan: Acute problems: Acute hypoxemic respiratory failure  2/2 right-sided parapneumonic effusion, empyema  **SEPSIS RULED OUT** -Hypoxemia (resolved)-TCTS consulted -s/p pigtail chest tube on 01/03/2020 and was removed  on 12/16. -Follow-up chest x-ray 12/17 without pneumothorax.   -Thoracic surgery signed off 12/17, prn OP follow-up recommended -Initially was treated with IV Zosyn beginning on 12/3) and  subsequently transitioned to Augmentin for a total of 6 weeks of treatment-completed antibiotics on 02/17/2020.   Physical deconditioning/gait disturbance -PT and OT recommending SNF. -Evaluated today with documentation that he is very motivated but did asked to go back to bed after walking.  Encourage more frequent out of bed so he will build endurance.  Patient finally encouraged to stay up in chair. -PT is also documented slow shuffling steps while mobilizing  Hypertension -Controlled off antihypertensive medication  Severe protein calorie malnutrition Nutrition Problem: Severe Malnutrition Etiology: chronic illness (CVA) Signs/Symptoms: moderate fat depletion,severe fat depletion,moderate muscle depletion,severe muscle depletion Interventions: Ensure Enlive (each supplement provides 350kcal and 20 grams of protein),Magic cup,MVI -As of 12/23 patient able to feed self and eating meals well (between 50% and 100% of meals over the past 24 hours) Estimated body mass index is 21.13 kg/m as calculated from the following:   Height as of this encounter: 5\' 10"  (1.778 m).   Weight as of this encounter: 66.8 kg.   Wounds: Incision (Closed) 01/03/20 (Active)  Date First Assessed/Time First Assessed: 01/03/20 2000      Assessments 01/03/2020  8:00 PM 02/06/2020  8:00 PM  Dressing Type - None  Dressing Dry;Clean;Intact Clean;Dry;Intact  Dressing Change Frequency - PRN  Site / Wound Assessment Dressing in place / Unable to assess Dry;Clean  Drainage Description Serosanguineous -     No Linked orders to display     Pressure Injury 02/12/20 Sacrum Mid Stage 2 -  Partial thickness loss of dermis presenting as a shallow open injury with a red, pink wound bed without slough. (Active)  Date First Assessed/Time First Assessed: 02/12/20 1930   Location: Sacrum  Location Orientation: Mid  Staging: Stage 2 -  Partial thickness loss of dermis presenting as a shallow open injury with a red, pink wound bed  without slough.    Assessments 02/12/2020  9:23 PM 02/16/2020  7:45 PM  Dressing Type Foam - Lift dressing to assess site every shift Foam - Lift dressing to assess site every shift  Dressing Changed -  Site / Wound Assessment Clean;Dry -  Drainage Amount None -     No Linked orders to display     Other problems: Acute kidney injury -Resolved -Current creatinine 1.01 with a GFR greater than 60  History of CVA/dementia without behavioral disturbances -Remains normotensive off of BP medication -Continue Plavix and low-dose aspirin -Continue statin/Crestor -Continue Cymbalta  Anemia of chronic disease -Hemoglobin stable around 10   Data Reviewed: Basic Metabolic Panel: No results for input(s): NA, K, CL, CO2, GLUCOSE, BUN, CREATININE, CALCIUM, MG, PHOS in the last 168 hours. Liver Function Tests: No results for input(s): AST, ALT, ALKPHOS, BILITOT, PROT, ALBUMIN in the last 168 hours. No results for input(s): LIPASE, AMYLASE in the last 168 hours. No results for input(s): AMMONIA in the last 168 hours. CBC: No results for input(s): WBC, NEUTROABS, HGB, HCT, MCV, PLT in the last 168 hours. Cardiac Enzymes: No results for input(s): CKTOTAL, CKMB, CKMBINDEX, TROPONINI in the last 168 hours. BNP (last 3 results) Recent Labs    12/26/19 1510 01/01/20 1018  BNP 88.7 177.1*    ProBNP (last 3 results) No results for input(s): PROBNP in the last 8760 hours.  CBG: No results for input(s): GLUCAP in the last 168 hours.  Recent Results (  from the past 240 hour(s))  SARS CORONAVIRUS 2 (TAT 6-24 HRS) Nasopharyngeal Nasopharyngeal Swab     Status: None   Collection Time: 02/13/20 11:42 AM   Specimen: Nasopharyngeal Swab  Result Value Ref Range Status   SARS Coronavirus 2 NEGATIVE NEGATIVE Final    Comment: (NOTE) SARS-CoV-2 target nucleic acids are NOT DETECTED.  The SARS-CoV-2 RNA is generally detectable in upper and lower respiratory specimens during the acute phase of  infection. Negative results do not preclude SARS-CoV-2 infection, do not rule out co-infections with other pathogens, and should not be used as the sole basis for treatment or other patient management decisions. Negative results must be combined with clinical observations, patient history, and epidemiological information. The expected result is Negative.  Fact Sheet for Patients: SugarRoll.be  Fact Sheet for Healthcare Providers: https://www.woods-mathews.com/  This test is not yet approved or cleared by the [place] FDA and  has been authorized for detection and/or diagnosis of SARS-CoV-2 by FDA under an Emergency Use Authorization (EUA). This EUA will remain  in effect (meaning this test can be used) for the duration of the COVID-19 declaration under Se ction 564(b)(1) of the Act, 21 U.S.C. section 360bbb-3(b)(1), unless the authorization is terminated or revoked sooner.  Performed at [facility], [place] [address], [place], [place] [address]      Studies: No results found.  Scheduled Meds: . aspirin EC  81 mg Oral Daily  . clopidogrel  75 mg Oral Daily  . DULoxetine  60 mg Oral Daily  . enoxaparin (LOVENOX) injection  40 mg Subcutaneous Daily  . feeding supplement  237 mL Oral TID BM  . multivitamin with minerals  1 tablet Oral Daily  . rosuvastatin  10 mg Oral Daily   Continuous Infusions:   Active Problems:   Empyema lung ([place])   Severe sepsis without septic shock ([place])   History of CVA in adulthood   Physical deconditioning   Dementia associated with other underlying disease without behavioral disturbance ([place])   Pressure injury of skin   Consultants:  TCTS  Conventional radiology  Procedures:  Chest tube placement 12/3 by IR  Antibiotics: Anti-infectives (From admission, onward)   Start     Dose/Rate Route Frequency Ordered Stop   02/13/20 0000  amoxicillin-clavulanate (AUGMENTIN) 875-125 MG tablet         1 tablet Oral Every 12 hours 02/13/20 1316 02/14/20 2359   02/05/20 1430  amoxicillin-clavulanate (AUGMENTIN) 875-125 MG per tablet 1 tablet        1 tablet Oral Every 12 hours 02/05/20 1338 02/16/20 2105   01/12/20 1400  metroNIDAZOLE (FLAGYL) tablet 500 mg  Status:  Discontinued        500 mg Oral Every 8 hours 01/12/20 1111 02/05/20 1337   01/12/20 1300  cefTRIAXone (ROCEPHIN) 2 g in sodium chloride 0.9 % 100 mL IVPB  Status:  Discontinued        2 g 200 mL/hr over 30 Minutes Intravenous Every 24 hours 01/12/20 1111 02/05/20 1337   01/02/20 2330  piperacillin-tazobactam (ZOSYN) IVPB 3.375 g  Status:  Discontinued        3.375 g 12.5 mL/hr over 240 Minutes Intravenous Every 8 hours 01/02/20 2242 01/12/20 1111       Time spent: 20 minutes    Erin Hearing ANP  [facility] 7 am- 330 pm/M-F for direct patient care and secure chat Please refer to Amion for contact information 48 days

## 2020-02-20 NOTE — Progress Notes (Signed)
Physical Therapy Treatment Patient Details Name: [name] [name] MRN: [id number] DOB: 05-Aug-1940 Today's Date: 02/20/2020    History of Present Illness 80 year old male with history of recent CVA in 11/2019 with residual dysarthria, hypertension, hyperlipidemia and spinal stenosis, initially presented to [facility] from [facility] with confusion and difficulty breathing.  Work-up revealed right-sided loculated pleural effusion with concern for empyema. Transfer to [facility]. Patient was evaluated by thoracic surgery who recommended chest tube placement by IR, that was performed on 01/03/2020.  ID consulted and recommended transitioning from IV Zosyn to IV ceftriaxone and oral Flagyl while in-house and discharge on p.o. Augmentin for a total of 6 weeks with start date of 01/03/2020. Right-sided chest tube removed 12/16.    PT Comments    Patient continues with slow processing and requires incr time for all activity. Patient able to walk in room (wearing disposable undergarment--although did not have incontinence when up). Continues to be globally weak and not yet safe to return home alone.     Follow Up Recommendations  SNF     Equipment Recommendations  Rolling walker with 5" wheels;3in1 (PT)    Recommendations for Other Services       Precautions / Restrictions Precautions Precautions: Fall Precaution Comments: incontinent    Mobility  Bed Mobility Overal bed mobility: Needs Assistance Bed Mobility: Supine to Sit     Supine to sit: Min assist     General bed mobility comments: pulled up on therapist's hand  Transfers Overall transfer level: Needs assistance Equipment used: Rolling walker (2 wheeled) Transfers: Sit to/from Stand Sit to Stand: Mod assist         General transfer comment: light mod assist to stand from elevated bed, minimal steadying assist for transfer, cues for hand placement with improved control of descent to chair  Ambulation/Gait Ambulation/Gait  assistance: Min guard Gait Distance (Feet): 30 Feet Assistive device: Rolling walker (2 wheeled) Gait Pattern/deviations: Step-to pattern;Wide base of support;Shuffle;Decreased stride length   Gait velocity interpretation: <1.8 ft/sec, indicate of risk for recurrent falls General Gait Details: pt eager to walk "I know I can do it"   Stairs             Wheelchair Mobility    Modified Rankin (Stroke Patients Only)       Balance Overall balance assessment: Needs assistance Sitting-balance support: Feet supported Sitting balance-Leahy Scale: Fair     Standing balance support: Bilateral upper extremity supported;During functional activity Standing balance-Leahy Scale: Poor Standing balance comment: reliant on B UE support of RW and external assist                            Cognition Arousal/Alertness: Awake/alert Behavior During Therapy: Flat affect Overall Cognitive Status: History of cognitive impairments - at baseline Area of Impairment: Attention;Awareness;Problem solving                 Orientation Level:  (NT)     Following Commands: Follows one step commands with increased time   Awareness: Intellectual Problem Solving: Slow processing;Requires verbal cues;Requires tactile cues;Difficulty sequencing;Decreased initiation General Comments: slow initiation and processing speed      Exercises General Exercises - Lower Extremity Ankle Circles/Pumps: 5 reps;AROM Long Arc Quad: Strengthening;Both;5 reps Hip ABduction/ADduction: Strengthening;5 reps    General Comments General comments (skin integrity, edema, etc.): Donned brief for incontinence prior to initiating OOB      Pertinent Vitals/Pain Pain Assessment: Faces Faces Pain Scale: No hurt  Home Living                      Prior Function            PT Goals (current goals can now be found in the care plan section) Acute Rehab PT Goals Patient Stated Goal: get  stronger PT Goal Formulation: With patient Time For Goal Achievement: 03/05/20 Potential to Achieve Goals: Fair Progress towards PT goals:  (goals updated)    Frequency    Min 2X/week      PT Plan Current plan remains appropriate    Co-evaluation              AM-PAC PT "6 Clicks" Mobility   Outcome Measure  Help needed turning from your back to your side while in a flat bed without using bedrails?: A Little Help needed moving from lying on your back to sitting on the side of a flat bed without using bedrails?: A Lot Help needed moving to and from a bed to a chair (including a wheelchair)?: A Lot Help needed standing up from a chair using your arms (e.g., wheelchair or bedside chair)?: A Lot Help needed to walk in hospital room?: A Lot Help needed climbing 3-5 steps with a railing? : A Lot 6 Click Score: 13    End of Session Equipment Utilized During Treatment: Gait belt Activity Tolerance: Patient tolerated treatment well Patient left: in chair;with call bell/phone within reach;with chair alarm set Nurse Communication: Mobility status PT Visit Diagnosis: Difficulty in walking, not elsewhere classified (R26.2);Muscle weakness (generalized) (M62.81)     Time: 6073-7106 PT Time Calculation (min) (ACUTE ONLY): 28 min  Charges:  $Gait Training: 8-22 mins $Therapeutic Exercise: 8-22 mins                      Arby Barrette, PT Pager [phone]    Rexanne Mano 02/20/2020, 1:38 PM

## 2020-02-20 NOTE — Progress Notes (Signed)
Occupational Therapy Treatment Patient Details Name: [name] [name] MRN: [id number] DOB: 09-24-1940 Today's Date: 02/20/2020    History of present illness 80 year old male with history of recent CVA in 11/2019 with residual dysarthria, hypertension, hyperlipidemia and spinal stenosis, initially presented to [facility] from [facility] SNF with confusion and difficulty breathing.  Work-up revealed right-sided loculated pleural effusion with concern for empyema. Transfer to [facility]. Patient was evaluated by thoracic surgery who recommended chest tube placement by IR, that was performed on 01/03/2020.  ID consulted and recommended transitioning from IV Zosyn to IV ceftriaxone and oral Flagyl while in-house and discharge on p.o. Augmentin for a total of 6 weeks with start date of 01/03/2020. Right-sided chest tube removed 12/16.   OT comments  Pt pleasant. Min assist to change gown, perform seated grooming and eating. Transferred to chair with light mod assist to stand from elevated surface and min assist to transfer. Pt without bowel incontinence this visit.   Follow Up Recommendations  SNF;Supervision/Assistance - 24 hour    Equipment Recommendations  None recommended by OT    Recommendations for Other Services      Precautions / Restrictions Precautions Precautions: Fall Precaution Comments: incontinent       Mobility Bed Mobility Overal bed mobility: Needs Assistance Bed Mobility: Supine to Sit     Supine to sit: Min assist     General bed mobility comments: pulled up on therapist's hand  Transfers Overall transfer level: Needs assistance Equipment used: Rolling walker (2 wheeled) Transfers: Sit to/from [facility] Sit to Stand: Mod assist Stand pivot transfers: Min assist       General transfer comment: light mod assist to stand from elevated bed, minimal steadying assist for transfer, cues for hand placement with improved control of descent to chair     Balance Overall balance assessment: Needs assistance   Sitting balance-Leahy Scale: Fair       Standing balance-Leahy Scale: Poor Standing balance comment: reliant on B UE support of RW and external assist                           ADL either performed or assessed with clinical judgement   ADL Overall ADL's : Needs assistance/impaired Eating/Feeding: Minimal assistance;Sitting Eating/Feeding Details (indicate cue type and reason): assisted to set up tray Grooming: Wash/dry hands;Wash/dry face;Oral care;Sitting;Set up           Upper Body Dressing : Minimal assistance;Sitting Upper Body Dressing Details (indicate cue type and reason): changed soiled gown                         Vision       Perception     Praxis      Cognition Arousal/Alertness: Awake/alert Behavior During Therapy: Flat affect Overall Cognitive Status: History of cognitive impairments - at baseline                                          Exercises     Shoulder Instructions       General Comments      Pertinent Vitals/ Pain       Pain Assessment: No/denies pain  Home Living  Prior Functioning/Environment              Frequency  Min 2X/week        Progress Toward Goals  OT Goals(current goals can now be found in the care plan section)  Progress towards OT goals: Progressing toward goals  Acute Rehab OT Goals Patient Stated Goal: eat breakfast OT Goal Formulation: With patient Time For Goal Achievement: 02/26/20 Potential to Achieve Goals: [place] Discharge plan remains appropriate;Frequency remains appropriate    Co-evaluation                 AM-PAC OT "6 Clicks" Daily Activity     Outcome Measure   Help from another person eating meals?: A Little Help from another person taking care of personal grooming?: A Little Help from another person toileting, which  includes using toliet, bedpan, or urinal?: Total Help from another person bathing (including washing, rinsing, drying)?: A Lot Help from another person to put on and taking off regular upper body clothing?: A Little Help from another person to put on and taking off regular lower body clothing?: Total 6 Click Score: 13    End of Session Equipment Utilized During Treatment: Gait belt;Rolling walker  OT Visit Diagnosis: Unsteadiness on feet (R26.81);Other abnormalities of gait and mobility (R26.89);Muscle weakness (generalized) (M62.81);Other symptoms and signs involving cognitive function   Activity Tolerance Patient tolerated treatment well   Patient Left in chair;with call bell/phone within reach;with chair alarm set   Nurse Communication          Time: [phone] OT Time Calculation (min): 17 min  Charges: OT General Charges $OT Visit: 1 Visit OT Treatments $Self Care/Home Management : 8-22 mins  Nestor Lewandowsky, OTR/L Acute Rehabilitation Services Pager: [phone] Office: [phone]   Malka So 02/20/2020, 9:38 AM

## 2020-02-20 NOTE — Progress Notes (Signed)
[facility] PROGRESS NOTE  [name] [name] KZS:[id number] DOB: 06/10/1940 DOA: 01/02/2020 PCP: [name], [name], MD  Status: Remains inpatient appropriate because:Unsafe d/c plan   Dispo:  Patient From:  Private residence  Planned Disposition:  SNF  Expected discharge date: 02/15/2020  Medically stable for discharge:   barriers to discharge: Needs SNF placement but Medicaid application in process and as of 1/12 reportedly has been approved.  [facility] had tentatively accepted patient but has had difficulty in family working with them and may need partial payment until [facility] approved.  Patient sister not willing to sign patient in for fear of being responsible for bill.  TOC has been unable to contact son.  Unconfirmed reports that patient received a sum of money that the son now has access to and may not be spending appropriately including on needs for patient.  [facility] director Mr. [name] made aware in the event we need to pursue guardianship for this patient.  Because of these concerns nurse case manager has contacted APS as of 1/13.   Code Status: DNR Family Communication: Son [name] 1/07-TOC has made repeated calls to patient's son on 1/12, 1/13 and 1/14 without any response; TOC spoke with patient's sister on 1/13 DVT prophylaxis: Lovenox Vaccination status: 1/10 patient given first dose of Pfizer COVID-vaccine  Foley catheter:  No   HPI: 80 year old male with history of recent CVA in 11/2019 with residual dysarthria, hypertension, hyperlipidemia and spinal stenosis, initially presented to [facility] from [facility] SNF with confusion and difficulty breathing. Work-up revealed right-sided loculated pleural effusion with concern for empyema. IR at [facility] attempted ultrasound-guided chest tube placement but were unable due to thickness of pleural fluid. He was evaluated by general surgery at [facility] who recommended transfer to [facility] for thoracic surgery evaluation.  Patient was evaluated by thoracic surgery who recommended chest tube placement by IR, that was performed on 01/03/2020. ID consulted and recommended transitioning from IV Zosyn to IV ceftriaxone and oral Flagyl while in-house and discharge on p.o. Augmentin for a total of 6 weeks with start date of 01/03/2020. Right-sided chest tube removed 12/16. TCTS signed off 12/17 and advised no outpatient follow-up needed.  Patient stable for discharge.  PT/OT recommending SNF.  Patient will need long-term Medicaid in place before can discharge.  Subjective: Patient sitting up in bed eating breakfast.  Again only conversant when engaged  Objective: Vitals:   02/19/20 2012 02/20/20 0427  BP: 125/77 122/71  Pulse: 78 66  Resp: 18 20  Temp: 98.4 F (36.9 C) 98.8 F (37.1 C)  SpO2: 97% 99%    Intake/Output Summary (Last 24 hours) at 02/20/2020 7322 Last data filed at 02/19/2020 2000 Gross per 24 hour  Intake 794 ml  Output 250 ml  Net 544 ml   Filed Weights   01/03/20 1414  Weight: 66.8 kg    Exam: Constitutional: Awake, calm, no acute distress Respiratory: Bilateral lung sounds are clear to auscultation, stable on room air Cardiovascular: Normal heart sounds, normotensive tachycardia, extremities warm to touch Abdomen: Soft, nontender.  Normoactive bowel sounds.  Patient eats most of his meals.  LBM 1/17 Neurologic: CN 2-12 grossly intact. Sensation intact,  Strength 5/5 x on left slightly weaker 4/5 on the right Psychiatric: Alert and oriented x name only.  Continues with flat affect   Assessment/Plan: Acute problems: Acute hypoxemic respiratory failure  2/2 right-sided parapneumonic effusion, empyema  **SEPSIS RULED OUT** -Hypoxemia (resolved)-TCTS consulted -s/p pigtail chest tube on 01/03/2020 and was removed on  12/16. -Follow-up chest x-ray 12/17 without pneumothorax.   -Thoracic surgery signed off 12/17, prn OP follow-up recommended -Initially was treated with IV Zosyn beginning on  12/3) and subsequently transitioned to Augmentin for a total of 6 weeks of treatment-completed antibiotics on 02/17/2020.   Physical deconditioning/gait disturbance -PT and OT recommending SNF. -PT documented slow shuffling steps while mobilizing -OT documenting patient requires minimal assistance to change gown, perform seated grooming and eating.  He is able to transfer to chair with light moderate assist to stand from elevated surface and minimal assist to transfer.  Hypertension -Controlled off antihypertensive medication  Severe protein calorie malnutrition Nutrition Problem: Severe Malnutrition Etiology: chronic illness (CVA) Signs/Symptoms: moderate fat depletion,severe fat depletion,moderate muscle depletion,severe muscle depletion Interventions: Ensure Enlive (each supplement provides 350kcal and 20 grams of protein),Magic cup,MVI -As of 12/23 patient able to feed self and eating meals well (between 50% and 100% of meals over the past 24 hours) Estimated body mass index is 21.13 kg/m as calculated from the following:   Height as of this encounter: 5\' 10"  (1.778 m).   Weight as of this encounter: 66.8 kg.   Wounds: Incision (Closed) 01/03/20 (Active)  Date First Assessed/Time First Assessed: 01/03/20 2000      Assessments 01/03/2020  8:00 PM 02/06/2020  8:00 PM  Dressing Type - None  Dressing Dry;Clean;Intact Clean;Dry;Intact  Dressing Change Frequency - PRN  Site / Wound Assessment Dressing in place / Unable to assess Dry;Clean  Drainage Description Serosanguineous -     No Linked orders to display     Pressure Injury 02/12/20 Sacrum Mid Stage 2 -  Partial thickness loss of dermis presenting as a shallow open injury with a red, pink wound bed without slough. (Active)  Date First Assessed/Time First Assessed: 02/12/20 1930   Location: Sacrum  Location Orientation: Mid  Staging: Stage 2 -  Partial thickness loss of dermis presenting as a shallow open injury with a red, pink  wound bed without slough.    Assessments 02/12/2020  9:23 PM 02/16/2020  7:45 PM  Dressing Type Foam - Lift dressing to assess site every shift Foam - Lift dressing to assess site every shift  Dressing Changed -  Site / Wound Assessment Clean;Dry -  Drainage Amount None -     No Linked orders to display     Other problems: Acute kidney injury -Resolved -Current creatinine 1.01 with a GFR greater than 60  History of CVA/dementia without behavioral disturbances -Remains normotensive off of BP medication -Continue Plavix and low-dose aspirin -Continue statin/Crestor -Continue Cymbalta  Anemia of chronic disease -Hemoglobin stable around 10   Data Reviewed: Basic Metabolic Panel: No results for input(s): NA, K, CL, CO2, GLUCOSE, BUN, CREATININE, CALCIUM, MG, PHOS in the last 168 hours. Liver Function Tests: No results for input(s): AST, ALT, ALKPHOS, BILITOT, PROT, ALBUMIN in the last 168 hours. No results for input(s): LIPASE, AMYLASE in the last 168 hours. No results for input(s): AMMONIA in the last 168 hours. CBC: No results for input(s): WBC, NEUTROABS, HGB, HCT, MCV, PLT in the last 168 hours. Cardiac Enzymes: No results for input(s): CKTOTAL, CKMB, CKMBINDEX, TROPONINI in the last 168 hours. BNP (last 3 results) Recent Labs    12/26/19 1510 01/01/20 1018  BNP 88.7 177.1*    ProBNP (last 3 results) No results for input(s): PROBNP in the last 8760 hours.  CBG: No results for input(s): GLUCAP in the last 168 hours.  Recent Results (from the past 240 hour(s))  SARS  CORONAVIRUS 2 (TAT 6-24 HRS) Nasopharyngeal Nasopharyngeal Swab     Status: None   Collection Time: 02/13/20 11:42 AM   Specimen: Nasopharyngeal Swab  Result Value Ref Range Status   SARS Coronavirus 2 NEGATIVE NEGATIVE Final    Comment: (NOTE) SARS-CoV-2 target nucleic acids are NOT DETECTED.  The SARS-CoV-2 RNA is generally detectable in upper and lower respiratory specimens during the acute  phase of infection. Negative results do not preclude SARS-CoV-2 infection, do not rule out co-infections with other pathogens, and should not be used as the sole basis for treatment or other patient management decisions. Negative results must be combined with clinical observations, patient history, and epidemiological information. The expected result is Negative.  Fact Sheet for Patients: SugarRoll.be  Fact Sheet for Healthcare Providers: https://www.woods-mathews.com/  This test is not yet approved or cleared by the [place] FDA and  has been authorized for detection and/or diagnosis of SARS-CoV-2 by FDA under an Emergency Use Authorization (EUA). This EUA will remain  in effect (meaning this test can be used) for the duration of the COVID-19 declaration under Se ction 564(b)(1) of the Act, 21 U.S.C. section 360bbb-3(b)(1), unless the authorization is terminated or revoked sooner.  Performed at [facility], [place] [address] [place], [place] [address]      Studies: No results found.  Scheduled Meds: . aspirin EC  81 mg Oral Daily  . clopidogrel  75 mg Oral Daily  . DULoxetine  60 mg Oral Daily  . enoxaparin (LOVENOX) injection  40 mg Subcutaneous Daily  . feeding supplement  237 mL Oral TID BM  . multivitamin with minerals  1 tablet Oral Daily  . rosuvastatin  10 mg Oral Daily   Continuous Infusions:   Active Problems:   Empyema lung ([place])   Severe sepsis without septic shock ([place])   History of CVA in adulthood   Physical deconditioning   Dementia associated with other underlying disease without behavioral disturbance ([place])   Pressure injury of skin   Consultants:  TCTS  Conventional radiology  Procedures:  Chest tube placement 12/3 by IR  Antibiotics: Anti-infectives (From admission, onward)   Start     Dose/Rate Route Frequency Ordered Stop   02/13/20 0000  amoxicillin-clavulanate (AUGMENTIN) 875-125  MG tablet        1 tablet Oral Every 12 hours 02/13/20 1316 02/14/20 2359   02/05/20 1430  amoxicillin-clavulanate (AUGMENTIN) 875-125 MG per tablet 1 tablet        1 tablet Oral Every 12 hours 02/05/20 1338 02/16/20 2105   01/12/20 1400  metroNIDAZOLE (FLAGYL) tablet 500 mg  Status:  Discontinued        500 mg Oral Every 8 hours 01/12/20 1111 02/05/20 1337   01/12/20 1300  cefTRIAXone (ROCEPHIN) 2 g in sodium chloride 0.9 % 100 mL IVPB  Status:  Discontinued        2 g 200 mL/hr over 30 Minutes Intravenous Every 24 hours 01/12/20 1111 02/05/20 1337   01/02/20 2330  piperacillin-tazobactam (ZOSYN) IVPB 3.375 g  Status:  Discontinued        3.375 g 12.5 mL/hr over 240 Minutes Intravenous Every 8 hours 01/02/20 2242 01/12/20 1111       Time spent: 20 minutes    Erin Hearing ANP  [facility] 7 am- 330 pm/M-F for direct patient care and secure chat Please refer to Amion for contact information 49 days

## 2020-02-20 NOTE — Plan of Care (Signed)
?  Problem: Clinical Measurements: ?Goal: Will remain free from infection ?Outcome: Progressing ?  ?

## 2020-02-21 NOTE — Plan of Care (Signed)

## 2020-02-21 NOTE — Progress Notes (Signed)
[facility] PROGRESS NOTE  [name] [name] SHF:[id number] DOB: 25-Jul-1940 DOA: 01/02/2020 PCP: [name], [name], MD  Status: Remains inpatient appropriate because:Unsafe d/c plan   Dispo:  Patient From:  Private residence  Planned Disposition:  SNF  Expected discharge date: 02/22/2020  Medically stable for discharge:   barriers to discharge: Needs SNF placement but Medicaid application in process and as of 1/12 reportedly has been approved.  [facility] had tentatively accepted patient but has had difficulty in family working with them and may need partial payment until [facility] approved.  Patient sister not willing to sign patient in for fear of being responsible for bill.  TOC has been unable to contact son.  Unconfirmed reports that patient received a sum of money that the son now has access to and may not be spending appropriately including on needs for patient.  [facility] director [name] made aware in the event we need to pursue guardianship for this patient.  Because of these concerns nurse case manager has contacted APS as of 1/13.   Code Status: DNR Family Communication: Son [name] [name] 1/07-TOC has made repeated calls to patient's son on 1/12, 1/13 and 1/14 without any response; TOC spoke with patient's sister on 1/13 DVT prophylaxis: Lovenox Vaccination status: 1/10 patient given first dose of Pfizer COVID-vaccine  Foley catheter:  No   HPI: 80 year old male with history of recent CVA in 11/2019 with residual dysarthria, hypertension, hyperlipidemia and spinal stenosis, initially presented to [facility] from [facility] SNF with confusion and difficulty breathing. Work-up revealed right-sided loculated pleural effusion with concern for empyema. IR at [facility] attempted ultrasound-guided chest tube placement but were unable due to thickness of pleural fluid. He was evaluated by general surgery at [facility] who recommended transfer to [facility] for thoracic surgery evaluation.  Patient was evaluated by thoracic surgery who recommended chest tube placement by IR, that was performed on 01/03/2020. ID consulted and recommended transitioning from IV Zosyn to IV ceftriaxone and oral Flagyl while in-house and discharge on p.o. Augmentin for a total of 6 weeks with start date of 01/03/2020. Right-sided chest tube removed 12/16. TCTS signed off 12/17 and advised no outpatient follow-up needed.  Patient stable for discharge.  PT/OT recommending SNF.  Patient will need long-term Medicaid in place before can discharge.  Subjective: Awake.  Sitting up in bed.  Has just awakened so has not had a breakfast tray set up for him yet.  No specific complaints verbalized.  Objective: Vitals:   02/20/20 1947 02/21/20 0337  BP: 130/74 (!) 142/70  Pulse: 79 72  Resp: 16 20  Temp: 99.4 F (37.4 C) 98.3 F (36.8 C)  SpO2: 97% 98%    Intake/Output Summary (Last 24 hours) at 02/21/2020 0757 Last data filed at 02/21/2020 0341 Gross per 24 hour  Intake 360 ml  Output 900 ml  Net -540 ml   Filed Weights   01/03/20 1414  Weight: 66.8 kg    Exam: Constitutional: Awake and alert.  Calm.  No acute distress Respiratory: Lung sounds clear to auscultation, stable on room air Cardiovascular:  Abdomen: Abdomen soft, nontender nondistended with normoactive bowel sounds.  Eating between 50 and 100% of meals LBM 1/20 Neurologic: CN 2-12 grossly intact. Sensation intact,  Strength 5/5 x on left slightly weaker 4/5 on the right Psychiatric: Alert and oriented x name only.  Continues with flat affect   Assessment/Plan: Acute problems: Acute hypoxemic respiratory failure  2/2 right-sided parapneumonic effusion, empyema  **SEPSIS RULED OUT** -Hypoxemia (resolved)-TCTS  consulted -s/p pigtail chest tube on 01/03/2020 and was removed on 12/16. -Follow-up chest x-ray 12/17 without pneumothorax.   -Thoracic surgery signed off 12/17, prn OP follow-up recommended -Initially was treated with IV Zosyn  beginning on 12/3) and subsequently transitioned to Augmentin for a total of 6 weeks of treatment-completed antibiotics on 02/17/2020.   Physical deconditioning/gait disturbance -PT and OT recommending SNF. -PT documented slow shuffling steps while mobilizing -OT documenting patient requires minimal assistance to change gown, perform seated grooming and eating.  He is able to transfer to chair with light moderate assist to stand from elevated surface and minimal assist to transfer.  Hypertension -Controlled off antihypertensive medication  Severe protein calorie malnutrition Nutrition Problem: Severe Malnutrition Etiology: chronic illness (CVA) Signs/Symptoms: moderate fat depletion,severe fat depletion,moderate muscle depletion,severe muscle depletion Interventions: Ensure Enlive (each supplement provides 350kcal and 20 grams of protein),Magic cup,MVI -As of 12/23 patient able to feed self and eating meals well (between 50% and 100% of meals over the past 24 hours) Estimated body mass index is 21.13 kg/m as calculated from the following:   Height as of this encounter: 5\' 10"  (1.778 m).   Weight as of this encounter: 66.8 kg.   Wounds: Incision (Closed) 01/03/20 (Active)  Date First Assessed/Time First Assessed: 01/03/20 2000      Assessments 01/03/2020  8:00 PM 02/06/2020  8:00 PM  Dressing Type -- None  Dressing Dry;Clean;Intact Clean;Dry;Intact  Dressing Change Frequency -- PRN  Site / Wound Assessment Dressing in place / Unable to assess Dry;Clean  Drainage Description Serosanguineous --     No Linked orders to display     Pressure Injury 02/12/20 Sacrum Mid Stage 2 -  Partial thickness loss of dermis presenting as a shallow open injury with a red, pink wound bed without slough. (Active)  Date First Assessed/Time First Assessed: 02/12/20 1930   Location: Sacrum  Location Orientation: Mid  Staging: Stage 2 -  Partial thickness loss of dermis presenting as a shallow open injury with  a red, pink wound bed without slough.    Assessments 02/12/2020  9:23 PM 02/20/2020  7:40 PM  Dressing Type Foam - Lift dressing to assess site every shift Foam - Lift dressing to assess site every shift  Dressing Changed --  Site / Wound Assessment Clean;Dry Clean;Dry  Drainage Amount None --     No Linked orders to display     Other problems: Acute kidney injury -Resolved -Current creatinine 1.01 with a GFR greater than 60  History of CVA/dementia without behavioral disturbances -Remains normotensive off of BP medication -Continue Plavix and low-dose aspirin -Continue statin/Crestor -Continue Cymbalta  Anemia of chronic disease -Hemoglobin stable around 10   Data Reviewed: Basic Metabolic Panel: No results for input(s): NA, K, CL, CO2, GLUCOSE, BUN, CREATININE, CALCIUM, MG, PHOS in the last 168 hours. Liver Function Tests: No results for input(s): AST, ALT, ALKPHOS, BILITOT, PROT, ALBUMIN in the last 168 hours. No results for input(s): LIPASE, AMYLASE in the last 168 hours. No results for input(s): AMMONIA in the last 168 hours. CBC: No results for input(s): WBC, NEUTROABS, HGB, HCT, MCV, PLT in the last 168 hours. Cardiac Enzymes: No results for input(s): CKTOTAL, CKMB, CKMBINDEX, TROPONINI in the last 168 hours. BNP (last 3 results) Recent Labs    12/26/19 1510 01/01/20 1018  BNP 88.7 177.1*    ProBNP (last 3 results) No results for input(s): PROBNP in the last 8760 hours.  CBG: No results for input(s): GLUCAP in the last 168  hours.  Recent Results (from the past 240 hour(s))  SARS CORONAVIRUS 2 (TAT 6-24 HRS) Nasopharyngeal Nasopharyngeal Swab     Status: None   Collection Time: 02/13/20 11:42 AM   Specimen: Nasopharyngeal Swab  Result Value Ref Range Status   SARS Coronavirus 2 NEGATIVE NEGATIVE Final    Comment: (NOTE) SARS-CoV-2 target nucleic acids are NOT DETECTED.  The SARS-CoV-2 RNA is generally detectable in upper and lower respiratory  specimens during the acute phase of infection. Negative results do not preclude SARS-CoV-2 infection, do not rule out co-infections with other pathogens, and should not be used as the sole basis for treatment or other patient management decisions. Negative results must be combined with clinical observations, patient history, and epidemiological information. The expected result is Negative.  Fact Sheet for Patients: SugarRoll.be  Fact Sheet for Healthcare Providers: https://www.woods-mathews.com/  This test is not yet approved or cleared by the [place] FDA and  has been authorized for detection and/or diagnosis of SARS-CoV-2 by FDA under an Emergency Use Authorization (EUA). This EUA will remain  in effect (meaning this test can be used) for the duration of the COVID-19 declaration under Se ction 564(b)(1) of the Act, 21 U.S.C. section 360bbb-3(b)(1), unless the authorization is terminated or revoked sooner.  Performed at [facility], [place] [address] [place], [place] [address]      Studies: No results found.  Scheduled Meds:  aspirin EC  81 mg Oral Daily   clopidogrel  75 mg Oral Daily   DULoxetine  60 mg Oral Daily   enoxaparin (LOVENOX) injection  40 mg Subcutaneous Daily   feeding supplement  237 mL Oral TID BM   multivitamin with minerals  1 tablet Oral Daily   rosuvastatin  10 mg Oral Daily   Continuous Infusions:   Active Problems:   Empyema lung ([place])   Severe sepsis without septic shock ([place])   History of CVA in adulthood   Physical deconditioning   Dementia associated with other underlying disease without behavioral disturbance ([place])   Pressure injury of skin   Consultants:  TCTS  Conventional radiology  Procedures:  Chest tube placement 12/3 by IR  Antibiotics: Anti-infectives (From admission, onward)   Start     Dose/Rate Route Frequency Ordered Stop   02/13/20 0000   amoxicillin-clavulanate (AUGMENTIN) 875-125 MG tablet        1 tablet Oral Every 12 hours 02/13/20 1316 02/14/20 2359   02/05/20 1430  amoxicillin-clavulanate (AUGMENTIN) 875-125 MG per tablet 1 tablet        1 tablet Oral Every 12 hours 02/05/20 1338 02/16/20 2105   01/12/20 1400  metroNIDAZOLE (FLAGYL) tablet 500 mg  Status:  Discontinued        500 mg Oral Every 8 hours 01/12/20 1111 02/05/20 1337   01/12/20 1300  cefTRIAXone (ROCEPHIN) 2 [name] in sodium chloride 0.9 % 100 mL IVPB  Status:  Discontinued        2 [name] 200 mL/hr over 30 Minutes Intravenous Every 24 hours 01/12/20 1111 02/05/20 1337   01/02/20 2330  piperacillin-tazobactam (ZOSYN) IVPB 3.375 [name]  Status:  Discontinued        3.375 [name] 12.5 mL/hr over 240 Minutes Intravenous Every 8 hours 01/02/20 2242 01/12/20 1111       Time spent: 20 minutes    Erin Hearing ANP  [facility] 7 am- 330 pm/M-F for direct patient care and secure chat Please refer to Amion for contact information 50 days

## 2020-02-21 NOTE — Progress Notes (Signed)
CSW reached out to DSS worker in attempt to obtain this patient's Medicaid number - message left for Herbie Baltimore at [place] [phone]).  CSW spoke with admissions at [name] who states the patient is currently under review for possible admission to the facility.  Madilyn Fireman, MSW, LCSW-A Transitions of Care   Clinical Social Worker I [phone]

## 2020-02-22 NOTE — Progress Notes (Signed)
PROGRESS NOTE  [name] [name] WUJ:[id number] DOB: [birth year] DOA: 01/02/2020 PCP: [name], [name], MD  HPI/Recap of past 21 hours:  80 year old male with history of recent CVA in 11/2019 with residual dysarthria, hypertension, hyperlipidemia and spinal stenosis, initially presented to [facility] from [facility] SNF with confusion and difficulty breathing. Work-up revealed right-sided loculated pleural effusion with concern for empyema. IR at [facility] attempted ultrasound-guided chest tube placement but were unable due to thickness of pleural fluid. He was evaluated by general surgery at [facility] who recommended transfer to [facility] for thoracic surgery evaluation. Patient was evaluated by thoracic surgery who recommended chest tube placement by IR, that was performed on 01/03/2020. ID consulted and recommended transitioning from IV Zosyn to IV ceftriaxone and oral Flagyl while in-house and discharge on p.o. Augmentin for a total of 6 weeks with start date of 01/03/2020. Right-sided chest tube removed 12/16. TCTS signed off 12/17 and advised no outpatient follow-up needed.  Patient stable for discharge.  PT/OT recommending SNF.  Patient will need long-term Medicaid in place before can discharge.  Subjective February 22, 2020 Patient seen and examined at bedside he denies any pain  Assessment/Plan: Active Problems:   Empyema lung ([place])   Severe sepsis without septic shock ([place])   History of CVA in adulthood   Physical deconditioning   Dementia associated with other underlying disease without behavioral disturbance ([place])   Pressure injury of skin  #1 empyema status post drainage  2.  Acute hypoxemic respiratory failure secondary to right-sided parapneumonic effusion empyema resolved now after drainage  3.  Code deconditioning continue PT and OT PT OT recommend SNF waiting for SNF  4.  Hypertension controlled continue current medicine  5.  Severe protein malnutrition.  Body max    index is estimated to be 21  Code Status: DNR  Severity of Illness: The appropriate patient status for this patient is INPATIENT. Inpatient status is judged to be reasonable and necessary in order to provide the required intensity of service to ensure the patient's safety. The patient's presenting symptoms, physical exam findings, and initial radiographic and laboratory data in the context of their chronic comorbidities is felt to place them at high risk for further clinical deterioration. Furthermore, it is not anticipated that the patient will be medically stable for discharge from the hospital within 2 midnights of admission. The following factors support the patient status of inpatient.   " The patient's presenting symptoms include infection empyema" The worrisome physical exam findings include acute infection needing drainage " The initial radiographic and laboratory data are worrisome because of infection" The chronic co-morbidities include status post drainage  * I certify that at the point of admission it is my clinical judgment that the patient will require inpatient hospital care spanning beyond 2 midnights from the point of admission due to high intensity of service, high risk for further deterioration and high frequency of surveillance required.*    Family Communication: Patient  Disposition Plan: SNF   Consultants:  Infectious disease  Procedures:  I&D chest tube  Antimicrobials:  Status post  DVT prophylaxis: Lovenox   Objective: Vitals:   02/21/20 1611 02/21/20 2015 02/22/20 0357 02/22/20 0610  BP: 136/80 127/73 125/76 124/70  Pulse: 68 72 70 (!) 57  Resp: 18 18 18 20   Temp: 98.4 F (36.9 C) 98.8 F (37.1 C) 98.5 F (36.9 C) 97.7 F (36.5 C)  TempSrc: Oral Oral Oral Oral  SpO2: 99% 98% 99% 98%  Weight:  Height:        Intake/Output Summary (Last 24 hours) at 02/22/2020 0926 Last data filed at 02/21/2020 2319 Gross per 24 hour  Intake --  Output  550 ml  Net -550 ml   Filed Weights   01/03/20 1414  Weight: 66.8 kg   Body mass index is 21.13 kg/m.  Exam:  . General: 80 y.o. year-old male well developed well nourished in no acute distress.  Alert and oriented x3. . Cardiovascular: Regular rate and rhythm with no rubs or gallops.  No thyromegaly or JVD noted.   [name] Respiratory: Clear to auscultation with no wheezes or rales. Good inspiratory effort. . Abdomen: Soft nontender nondistended with normal bowel sounds x4 quadrants. . Musculoskeletal: No lower extremity edema. 2/4 pulses in all 4 extremities. . Skin: No ulcerative lesions noted or rashes, . Psychiatry: Mood is appropriate for condition and setting    Data Reviewed: CBC: No results for input(s): WBC, NEUTROABS, HGB, HCT, MCV, PLT in the last 168 hours. Basic Metabolic Panel: No results for input(s): NA, K, CL, CO2, GLUCOSE, BUN, CREATININE, CALCIUM, MG, PHOS in the last 168 hours. GFR: Estimated Creatinine Clearance: 60.5 mL/min (by C-G formula based on SCr of 0.95 mg/dL). Liver Function Tests: No results for input(s): AST, ALT, ALKPHOS, BILITOT, PROT, ALBUMIN in the last 168 hours. No results for input(s): LIPASE, AMYLASE in the last 168 hours. No results for input(s): AMMONIA in the last 168 hours. Coagulation Profile: No results for input(s): INR, PROTIME in the last 168 hours. Cardiac Enzymes: No results for input(s): CKTOTAL, CKMB, CKMBINDEX, TROPONINI in the last 168 hours. BNP (last 3 results) No results for input(s): PROBNP in the last 8760 hours. HbA1C: No results for input(s): HGBA1C in the last 72 hours. CBG: No results for input(s): GLUCAP in the last 168 hours. Lipid Profile: No results for input(s): CHOL, HDL, LDLCALC, TRIG, CHOLHDL, LDLDIRECT in the last 72 hours. Thyroid Function Tests: No results for input(s): TSH, T4TOTAL, FREET4, T3FREE, THYROIDAB in the last 72 hours. Anemia Panel: No results for input(s): VITAMINB12, FOLATE, FERRITIN,  TIBC, IRON, RETICCTPCT in the last 72 hours. Urine analysis:    Component Value Date/Time   COLORURINE YELLOW (A) 12/29/2019 1127   APPEARANCEUR HAZY (A) 12/29/2019 1127   APPEARANCEUR Cloudy 01/02/2012 1326   LABSPEC 1.019 12/29/2019 1127   LABSPEC 1.017 01/02/2012 1326   PHURINE 5.0 12/29/2019 1127   GLUCOSEU NEGATIVE 12/29/2019 1127   GLUCOSEU NEGATIVE 09/26/2017 1104   HGBUR NEGATIVE 12/29/2019 1127   [place] 12/29/2019 1127   BILIRUBINUR Negative 01/02/2012 1326   KETONESUR NEGATIVE 12/29/2019 1127   PROTEINUR NEGATIVE 12/29/2019 1127   UROBILINOGEN 0.2 09/26/2017 1104   NITRITE NEGATIVE 12/29/2019 1127   LEUKOCYTESUR MODERATE (A) 12/29/2019 1127   LEUKOCYTESUR 3+ 01/02/2012 1326   Sepsis Labs: @LABRCNTIP (procalcitonin:4,lacticidven:4)  ) Recent Results (from the past 240 hour(s))  SARS CORONAVIRUS 2 (TAT 6-24 HRS) Nasopharyngeal Nasopharyngeal Swab     Status: None   Collection Time: 02/13/20 11:42 AM   Specimen: Nasopharyngeal Swab  Result Value Ref Range Status   SARS Coronavirus 2 NEGATIVE NEGATIVE Final    Comment: (NOTE) SARS-CoV-2 target nucleic acids are NOT DETECTED.  The SARS-CoV-2 RNA is generally detectable in upper and lower respiratory specimens during the acute phase of infection. Negative results do not preclude SARS-CoV-2 infection, do not rule out co-infections with other pathogens, and should not be used as the sole basis for treatment or other patient management decisions. Negative results must be  combined with clinical observations, patient history, and epidemiological information. The expected result is Negative.  Fact Sheet for Patients: SugarRoll.be  Fact Sheet for Healthcare Providers: https://www.woods-mathews.com/  This test is not yet approved or cleared by the [place] FDA and  has been authorized for detection and/or diagnosis of SARS-CoV-2 by FDA under an Emergency Use  Authorization (EUA). This EUA will remain  in effect (meaning this test can be used) for the duration of the COVID-19 declaration under Se ction 564(b)(1) of the Act, 21 U.S.C. section 360bbb-3(b)(1), unless the authorization is terminated or revoked sooner.  Performed at [facility], [place] [address] [place], [place] [address]       Studies: No results found.  Scheduled Meds: . aspirin EC  81 mg Oral Daily  . clopidogrel  75 mg Oral Daily  . DULoxetine  60 mg Oral Daily  . enoxaparin (LOVENOX) injection  40 mg Subcutaneous Daily  . feeding supplement  237 mL Oral TID BM  . multivitamin with minerals  1 tablet Oral Daily  . rosuvastatin  10 mg Oral Daily    Continuous Infusions:   LOS: 51 days     Cristal Deer, MD [facility]  To reach me or the doctor on call, go to: [email or web address] Password [facility]  02/22/2020, 9:26 AM

## 2020-02-23 NOTE — Progress Notes (Signed)
PROGRESS NOTE  [name] [name] [id number] DOB: 1940-05-04 DOA: 01/02/2020 PCP: [name], [name], MD  HPI/Recap of past 15 hours:  80 year old male with history of recent CVA in 11/2019 with residual dysarthria, hypertension, hyperlipidemia and spinal stenosis, initially presented to [facility] from [facility] SNF with confusion and difficulty breathing. Work-up revealed right-sided loculated pleural effusion with concern for empyema. IR at [facility] attempted ultrasound-guided chest tube placement but were unable due to thickness of pleural fluid. He was evaluated by general surgery at [facility] who recommended transfer to [facility] for thoracic surgery evaluation. Patient was evaluated by thoracic surgery who recommended chest tube placement by IR, that was performed on 01/03/2020. ID consulted and recommended transitioning from IV Zosyn to IV ceftriaxone and oral Flagyl while in-house and discharge on p.o. Augmentin for a total of 6 weeks with start date of 01/03/2020. Right-sided chest tube removed 12/16. TCTS signed off 12/17 and advised no outpatient follow-up needed.  Patient stable for discharge.  PT/OT recommending SNF.  Patient will need long-term Medicaid in place before can discharge.  Subjective February 22, 2020 Patient seen and examined at bedside he denies any pain  Assessment/Plan: Active Problems:   Empyema lung ([place])   Severe sepsis without septic shock ([place])   History of CVA in adulthood   Physical deconditioning   Dementia associated with other underlying disease without behavioral disturbance ([place])   Pressure injury of skin  #1 empyema status post drainage right-sided chest tube was placed on January 03, 2020 and removed January 16, 2020.  2.  Acute hypoxemic respiratory failure secondary to right-sided parapneumonic effusion empyema resolved now after drainage  3.  Code deconditioning continue PT and OT. PT OT recommend SNF waiting for SNF  4.  Hypertension  controlled continue current medicine  5.  Severe protein malnutrition.  Body max   index is estimated to be 21  6.  Sepsis ruled out patient initially was on IV Zosyn started December 3 and subsequently transitioned to Augmentin for total of 6 weeks of treatment which was completed February 17, 2020  7. Sacral ulcer present on admission  Pressure Injury 02/12/20 Sacrum Mid Stage 2 -  Partial thickness loss of dermis presenting as a shallow open injury with a red, pink wound bed without slough. (Active)  02/12/20 1930  Location: Sacrum  Location Orientation: Mid  Staging: Stage 2 -  Partial thickness loss of dermis presenting as a shallow open injury with a red, pink wound bed without slough.  Wound Description (Comments):   Present on Admission:     Code Status: DNR  Severity of Illness: The appropriate patient status for this patient is INPATIENT. Inpatient status is judged to be reasonable and necessary in order to provide the required intensity of service to ensure the patient's safety. The patient's presenting symptoms, physical exam findings, and initial radiographic and laboratory data in the context of their chronic comorbidities is felt to place them at high risk for further clinical deterioration. Furthermore, it is not anticipated that the patient will be medically stable for discharge from the hospital within 2 midnights of admission. The following factors support the patient status of inpatient.   " The patient's presenting symptoms include infection empyema" The worrisome physical exam findings include acute infection needing drainage " The initial radiographic and laboratory data are worrisome because of infection" The chronic co-morbidities include status post drainage  * I certify that at the point of admission it is my clinical judgment that the  patient will require inpatient hospital care spanning beyond 2 midnights from the point of admission due to high intensity of  service, high risk for further deterioration and high frequency of surveillance required.*    Family Communication: Patient  Disposition Plan: SNF Barrier to discharge-awaiting bed at skilled nursing facility  Consultants:  Infectious disease  Thoracic surgery  Procedures:  I&D chest tube January 03, 2020  Antimicrobials: Status post:  -Initially was treated with IV Zosyn beginning on 12/3) and subsequently transitioned to Augmentin for a total of 6 weeks of treatment-completed antibiotics on 02/17/2020.  DVT prophylaxis: Lovenox   Objective: Vitals:   02/22/20 0610 02/22/20 1352 02/22/20 2100 02/23/20 0501  BP: 124/70 137/80 126/72 138/71  Pulse: (!) 57 66 80 63  Resp: 20 17 18    Temp: 97.7 F (36.5 C) 98.4 F (36.9 C) 98.6 F (37 C) 98.1 F (36.7 C)  TempSrc: Oral Oral Oral Oral  SpO2: 98% (!) 78% 99% 100%  Weight:      Height:        Intake/Output Summary (Last 24 hours) at 02/23/2020 0953 Last data filed at 02/23/2020 0838 Gross per 24 hour  Intake 60 ml  Output 800 ml  Net -740 ml   Filed Weights   01/03/20 1414  Weight: 66.8 kg   Body mass index is 21.13 kg/m.  Exam:  . General: 80 y.o. year-old male well developed well nourished in no acute distress.  Alert and oriented x3. . Cardiovascular: Regular rate and rhythm with no rubs or gallops.  No thyromegaly or JVD noted.   [name] Respiratory: Clear to auscultation with no wheezes or rales. Good inspiratory effort. . Abdomen: Soft nontender nondistended with normal bowel sounds x4 quadrants. . Musculoskeletal: No lower extremity edema. 2/4 pulses in all 4 extremities. . Skin: No ulcerative lesions noted or rashes, . Psychiatry: Mood is appropriate for condition and setting    Data Reviewed: CBC: No results for input(s): WBC, NEUTROABS, HGB, HCT, MCV, PLT in the last 168 hours. Basic Metabolic Panel: No results for input(s): NA, K, CL, CO2, GLUCOSE, BUN, CREATININE, CALCIUM, MG, PHOS in the last  168 hours. GFR: Estimated Creatinine Clearance: 60.5 mL/min (by C-G formula based on SCr of 0.95 mg/dL). Liver Function Tests: No results for input(s): AST, ALT, ALKPHOS, BILITOT, PROT, ALBUMIN in the last 168 hours. No results for input(s): LIPASE, AMYLASE in the last 168 hours. No results for input(s): AMMONIA in the last 168 hours. Coagulation Profile: No results for input(s): INR, PROTIME in the last 168 hours. Cardiac Enzymes: No results for input(s): CKTOTAL, CKMB, CKMBINDEX, TROPONINI in the last 168 hours. BNP (last 3 results) No results for input(s): PROBNP in the last 8760 hours. HbA1C: No results for input(s): HGBA1C in the last 72 hours. CBG: No results for input(s): GLUCAP in the last 168 hours. Lipid Profile: No results for input(s): CHOL, HDL, LDLCALC, TRIG, CHOLHDL, LDLDIRECT in the last 72 hours. Thyroid Function Tests: No results for input(s): TSH, T4TOTAL, FREET4, T3FREE, THYROIDAB in the last 72 hours. Anemia Panel: No results for input(s): VITAMINB12, FOLATE, FERRITIN, TIBC, IRON, RETICCTPCT in the last 72 hours. Urine analysis:    Component Value Date/Time   COLORURINE YELLOW (A) 12/29/2019 1127   APPEARANCEUR HAZY (A) 12/29/2019 1127   APPEARANCEUR Cloudy 01/02/2012 1326   LABSPEC 1.019 12/29/2019 1127   LABSPEC 1.017 01/02/2012 1326   PHURINE 5.0 12/29/2019 1127   GLUCOSEU NEGATIVE 12/29/2019 1127   GLUCOSEU NEGATIVE 09/26/2017 1104   HGBUR NEGATIVE  12/29/2019 [place] 12/29/2019 1127   BILIRUBINUR Negative 01/02/2012 [place] 12/29/2019 1127   PROTEINUR NEGATIVE 12/29/2019 1127   UROBILINOGEN 0.2 09/26/2017 1104   NITRITE NEGATIVE 12/29/2019 1127   LEUKOCYTESUR MODERATE (A) 12/29/2019 1127   LEUKOCYTESUR 3+ 01/02/2012 1326   Sepsis Labs: @LABRCNTIP (procalcitonin:4,lacticidven:4)  ) Recent Results (from the past 240 hour(s))  SARS CORONAVIRUS 2 (TAT 6-24 HRS) Nasopharyngeal Nasopharyngeal Swab     Status:  None   Collection Time: 02/13/20 11:42 AM   Specimen: Nasopharyngeal Swab  Result Value Ref Range Status   SARS Coronavirus 2 NEGATIVE NEGATIVE Final    Comment: (NOTE) SARS-CoV-2 target nucleic acids are NOT DETECTED.  The SARS-CoV-2 RNA is generally detectable in upper and lower respiratory specimens during the acute phase of infection. Negative results do not preclude SARS-CoV-2 infection, do not rule out co-infections with other pathogens, and should not be used as the sole basis for treatment or other patient management decisions. Negative results must be combined with clinical observations, patient history, and epidemiological information. The expected result is Negative.  Fact Sheet for Patients: SugarRoll.be  Fact Sheet for Healthcare Providers: https://www.woods-mathews.com/  This test is not yet approved or cleared by the [place] FDA and  has been authorized for detection and/or diagnosis of SARS-CoV-2 by FDA under an Emergency Use Authorization (EUA). This EUA will remain  in effect (meaning this test can be used) for the duration of the COVID-19 declaration under Se ction 564(b)(1) of the Act, 21 U.S.C. section 360bbb-3(b)(1), unless the authorization is terminated or revoked sooner.  Performed at [facility], [place] [address] [place], [place] [address]       Studies: No results found.  Scheduled Meds: . aspirin EC  81 mg Oral Daily  . clopidogrel  75 mg Oral Daily  . DULoxetine  60 mg Oral Daily  . enoxaparin (LOVENOX) injection  40 mg Subcutaneous Daily  . feeding supplement  237 mL Oral TID BM  . multivitamin with minerals  1 tablet Oral Daily  . rosuvastatin  10 mg Oral Daily     LOS: 63 days     Cristal Deer, MD [facility]  To reach me or the doctor on call, go to: [email or web address] Password [facility]  02/23/2020, 9:53 AM

## 2020-02-23 NOTE — Plan of Care (Signed)
  Problem: Education: Goal: Knowledge of General Education information will improve Description: Including pain rating scale, medication(s)/side effects and non-pharmacologic comfort measures Outcome: Progressing   Problem: Pain Managment: Goal: General experience of comfort will improve Outcome: Progressing   Problem: Skin Integrity: Goal: Risk for impaired skin integrity will decrease Outcome: Progressing   

## 2020-02-23 NOTE — Progress Notes (Signed)
PROGRESS NOTE  [name] [name] ZOX:[id number] DOB: [birth year] DOA: 01/02/2020 PCP: [name], [name], MD  HPI/Recap of past 24 hours:  80 year old male with history of recent CVA in 11/2019 with residual dysarthria, hypertension, hyperlipidemia and spinal stenosis, initially presented to [facility] from [facility] SNF with confusion and difficulty breathing. Work-up revealed right-sided loculated pleural effusion with concern for empyema. IR at [facility] attempted ultrasound-guided chest tube placement but were unable due to thickness of pleural fluid. He was evaluated by general surgery at [facility] who recommended transfer to [facility] for thoracic surgery evaluation. Patient was evaluated by thoracic surgery who recommended chest tube placement by IR, that was performed on 01/03/2020. ID consulted and recommended transitioning from IV Zosyn to IV ceftriaxone and oral Flagyl while in-house and discharge on p.o. Augmentin for a total of 6 weeks with start date of 01/03/2020. Right-sided chest tube removed 12/16. TCTS signed off 12/17 and advised no outpatient follow-up needed.  Patient stable for discharge.  PT/OT recommending SNF.  Patient will need long-term Medicaid in place before can discharge.  Subjective: February 23, 2020: Patient seen and examined at bedside Patient denies any new complain  February 22, 2020 Patient seen and examined at bedside he denies any pain  Assessment/Plan: Active Problems:   Empyema lung ([place])   Severe sepsis without septic shock ([place])   History of CVA in adulthood   Physical deconditioning   Dementia associated with other underlying disease without behavioral disturbance ([place])   Pressure injury of skin  #1 empyema status post drainage right-sided chest tube was placed on January 03, 2020 and removed January 16, 2020.  2.  Acute hypoxemic respiratory failure secondary to right-sided parapneumonic effusion empyema resolved now after drainage  3.    deconditioning continue PT and OT. PT OT recommend SNF waiting for SNF  4.  Hypertension controlled continue current medicine  5.  Severe protein malnutrition.  Body max   index is estimated to be 21 continue nutritional supplements  6.  Sepsis ruled out patient initially was on IV Zosyn started December 3 and subsequently transitioned to Augmentin for total of 6 weeks of treatment which was completed February 17, 2020  7. Sacral ulcer present on admission  Pressure Injury 02/12/20 Sacrum Mid Stage 2 -  Partial thickness loss of dermis presenting as a shallow open injury with a red, pink wound bed without slough. (Active)  02/12/20 1930  Location: Sacrum  Location Orientation: Mid  Staging: Stage 2 -  Partial thickness loss of dermis presenting as a shallow open injury with a red, pink wound bed without slough.  Wound Description (Comments):   Present on Admission:     Code Status: DNR  Severity of Illness: The appropriate patient status for this patient is INPATIENT. Inpatient status is judged to be reasonable and necessary in order to provide the required intensity of service to ensure the patient's safety. The patient's presenting symptoms, physical exam findings, and initial radiographic and laboratory data in the context of their chronic comorbidities is felt to place them at high risk for further clinical deterioration. Furthermore, it is not anticipated that the patient will be medically stable for discharge from the hospital within 2 midnights of admission. The following factors support the patient status of inpatient.   " The patient's presenting symptoms include infection empyema" The worrisome physical exam findings include acute infection needing drainage " The initial radiographic and laboratory data are worrisome because of infection" The chronic co-morbidities include status post  drainage  * I certify that at the point of admission it is my clinical judgment that the  patient will require inpatient hospital care spanning beyond 2 midnights from the point of admission due to high intensity of service, high risk for further deterioration and high frequency of surveillance required.*    Family Communication: Patient  Disposition Plan: SNF Barrier to discharge-awaiting bed at skilled nursing facility  Consultants:  Infectious disease  Thoracic surgery  IR  Procedures:  I&D chest tube January 03, 2020  Antimicrobials: Status post:  -Initially was treated with IV Zosyn beginning on 12/3) and subsequently transitioned to Augmentin for a total of 6 weeks of treatment-completed antibiotics on 02/17/2020.  DVT prophylaxis: Lovenox   Objective: Vitals:   02/22/20 0610 02/22/20 1352 02/22/20 2100 02/23/20 0501  BP: 124/70 137/80 126/72 138/71  Pulse: (!) 57 66 80 63  Resp: 20 17 18    Temp: 97.7 F (36.5 C) 98.4 F (36.9 C) 98.6 F (37 C) 98.1 F (36.7 C)  TempSrc: Oral Oral Oral Oral  SpO2: 98% (!) 78% 99% 100%  Weight:      Height:        Intake/Output Summary (Last 24 hours) at 02/23/2020 1007 Last data filed at 02/23/2020 0838 Gross per 24 hour  Intake 60 ml  Output 800 ml  Net -740 ml   Filed Weights   01/03/20 1414  Weight: 66.8 kg   Body mass index is 21.13 kg/m.  Exam:  . General: 80 y.o. year-old male well developed well nourished in no acute distress.  Alert and oriented x3. . Cardiovascular: Regular rate and rhythm with no rubs or gallops.  No thyromegaly or JVD noted.   [name] Respiratory: Clear to auscultation with no wheezes or rales. Good inspiratory effort. . Abdomen: Soft nontender nondistended with normal bowel sounds x4 quadrants. . Musculoskeletal: No lower extremity edema. 2/4 pulses in all 4 extremities. . Skin: No ulcerative lesions noted or rashes, . Psychiatry: Mood is appropriate for condition and setting    Data Reviewed: CBC: No results for input(s): WBC, NEUTROABS, HGB, HCT, MCV, PLT in the last 168  hours. Basic Metabolic Panel: No results for input(s): NA, K, CL, CO2, GLUCOSE, BUN, CREATININE, CALCIUM, MG, PHOS in the last 168 hours. GFR: Estimated Creatinine Clearance: 60.5 mL/min (by C-G formula based on SCr of 0.95 mg/dL). Liver Function Tests: No results for input(s): AST, ALT, ALKPHOS, BILITOT, PROT, ALBUMIN in the last 168 hours. No results for input(s): LIPASE, AMYLASE in the last 168 hours. No results for input(s): AMMONIA in the last 168 hours. Coagulation Profile: No results for input(s): INR, PROTIME in the last 168 hours. Cardiac Enzymes: No results for input(s): CKTOTAL, CKMB, CKMBINDEX, TROPONINI in the last 168 hours. BNP (last 3 results) No results for input(s): PROBNP in the last 8760 hours. HbA1C: No results for input(s): HGBA1C in the last 72 hours. CBG: No results for input(s): GLUCAP in the last 168 hours. Lipid Profile: No results for input(s): CHOL, HDL, LDLCALC, TRIG, CHOLHDL, LDLDIRECT in the last 72 hours. Thyroid Function Tests: No results for input(s): TSH, T4TOTAL, FREET4, T3FREE, THYROIDAB in the last 72 hours. Anemia Panel: No results for input(s): VITAMINB12, FOLATE, FERRITIN, TIBC, IRON, RETICCTPCT in the last 72 hours. Urine analysis:    Component Value Date/Time   COLORURINE YELLOW (A) 12/29/2019 1127   APPEARANCEUR HAZY (A) 12/29/2019 1127   APPEARANCEUR Cloudy 01/02/2012 1326   LABSPEC 1.019 12/29/2019 1127   LABSPEC 1.017 01/02/2012 1326  PHURINE 5.0 12/29/2019 1127   GLUCOSEU NEGATIVE 12/29/2019 1127   GLUCOSEU NEGATIVE 09/26/2017 1104   HGBUR NEGATIVE 12/29/2019 1127   [place] 12/29/2019 1127   BILIRUBINUR Negative 01/02/2012 [place] 12/29/2019 1127   PROTEINUR NEGATIVE 12/29/2019 1127   UROBILINOGEN 0.2 09/26/2017 1104   NITRITE NEGATIVE 12/29/2019 1127   LEUKOCYTESUR MODERATE (A) 12/29/2019 1127   LEUKOCYTESUR 3+ 01/02/2012 1326   Sepsis  Labs: @LABRCNTIP (procalcitonin:4,lacticidven:4)  ) Recent Results (from the past 240 hour(s))  SARS CORONAVIRUS 2 (TAT 6-24 HRS) Nasopharyngeal Nasopharyngeal Swab     Status: None   Collection Time: 02/13/20 11:42 AM   Specimen: Nasopharyngeal Swab  Result Value Ref Range Status   SARS Coronavirus 2 NEGATIVE NEGATIVE Final    Comment: (NOTE) SARS-CoV-2 target nucleic acids are NOT DETECTED.  The SARS-CoV-2 RNA is generally detectable in upper and lower respiratory specimens during the acute phase of infection. Negative results do not preclude SARS-CoV-2 infection, do not rule out co-infections with other pathogens, and should not be used as the sole basis for treatment or other patient management decisions. Negative results must be combined with clinical observations, patient history, and epidemiological information. The expected result is Negative.  Fact Sheet for Patients: SugarRoll.be  Fact Sheet for Healthcare Providers: https://www.woods-mathews.com/  This test is not yet approved or cleared by the [place] FDA and  has been authorized for detection and/or diagnosis of SARS-CoV-2 by FDA under an Emergency Use Authorization (EUA). This EUA will remain  in effect (meaning this test can be used) for the duration of the COVID-19 declaration under Se ction 564(b)(1) of the Act, 21 U.S.C. section 360bbb-3(b)(1), unless the authorization is terminated or revoked sooner.  Performed at [facility], [place] [address] [place], [place] [address]       Studies: No results found.  Scheduled Meds: . aspirin EC  81 mg Oral Daily  . clopidogrel  75 mg Oral Daily  . DULoxetine  60 mg Oral Daily  . enoxaparin (LOVENOX) injection  40 mg Subcutaneous Daily  . feeding supplement  237 mL Oral TID BM  . multivitamin with minerals  1 tablet Oral Daily  . rosuvastatin  10 mg Oral Daily     LOS: 4 days     Cristal Deer,  MD [facility]  To reach me or the doctor on call, go to: [email or web address] Password [facility]  02/23/2020, 10:07 AM

## 2020-02-24 NOTE — Progress Notes (Signed)
[facility] PROGRESS NOTE  Drew Lips ACZ:[id number] DOB: 06-16-1940 DOA: 01/02/2020 PCP: [name], [name], MD  Status: Remains inpatient appropriate because:Unsafe d/c plan   Dispo:  Patient From:  Private residence  Planned Disposition:  SNF  Expected discharge date: 02/22/2020  Medically stable for discharge:   barriers to discharge: Needs SNF placement but Medicaid application in process and as of 1/12 reportedly has been approved.  [facility] had tentatively accepted patient but has had difficulty in family working with them and may need partial payment until [facility] approved.  Patient sister not willing to sign patient in for fear of being responsible for bill.  TOC has been unable to contact son.  Unconfirmed reports that patient received a sum of money that the son now has access to and may not be spending appropriately including on needs for patient.  [facility] director Mr. [name] made aware in the event we need to pursue guardianship for this patient.  Because of these concerns nurse case manager has contacted [employer] as of 1/13.  As of last week (1/20) family now agreeable to signing patient into skilled nursing facility if bed can be located.   Code Status: DNR Family Communication: Son [name] 1/07-TOC has made repeated calls to patient's son on 1/12, 1/13 and 1/14 without any response; TOC spoke with patient's sister on 1/13 DVT prophylaxis: Lovenox Vaccination status: 1/10 patient given first dose of Pfizer COVID-vaccine  Foley catheter:  No   HPI: 80 year old male with history of recent CVA in 11/2019 with residual dysarthria, hypertension, hyperlipidemia and spinal stenosis, initially presented to [facility] from [facility] SNF with confusion and difficulty breathing. Work-up revealed right-sided loculated pleural effusion with concern for empyema. IR at [facility] attempted ultrasound-guided chest tube placement but were unable due to thickness of pleural fluid. He was  evaluated by general surgery at [facility] who recommended transfer to [facility] for thoracic surgery evaluation. Patient was evaluated by thoracic surgery who recommended chest tube placement by IR, that was performed on 01/03/2020. ID consulted and recommended transitioning from IV Zosyn to IV ceftriaxone and oral Flagyl while in-house and discharge on p.o. Augmentin for a total of 6 weeks with start date of 01/03/2020. Right-sided chest tube removed 12/16. TCTS signed off 12/17 and advised no outpatient follow-up needed.  Patient stable for discharge.  PT/OT recommending SNF.  Patient will need long-term Medicaid in place before can discharge.  Subjective: Awake, minimally verbally conversant as is his baseline.  Has eaten most of his breakfast.  Objective: Vitals:   02/23/20 1947 02/24/20 0300  BP: 140/73 121/83  Pulse: 78 64  Resp: 18 18  Temp: 98.3 F (36.8 C) 97.9 F (36.6 C)  SpO2: 100% 99%    Intake/Output Summary (Last 24 hours) at 02/24/2020 0901 Last data filed at 02/24/2020 1093 Gross per 24 hour  Intake 520 ml  Output 950 ml  Net -430 ml   Filed Weights   01/03/20 1414  Weight: 66.8 kg    Exam: Constitutional: Alert, no acute distress, calm Respiratory: Sounds are clear to auscultation, stable on room air Cardiovascular: Normal heart sounds, pulse regular, extremities warm to touch Abdomen: Soft and nontender, normoactive bowel sounds.  Tolerating solid diet and is able to feed self.  LBM 1/23 Neurologic: CN 2-12 grossly intact. Sensation intact,  Strength 5/5 x on left slightly weaker 4/5 on the right Psychiatric: Alert and oriented x name only.  Continues with flat affect   Assessment/Plan: Acute problems: Acute hypoxemic respiratory  failure  2/2 right-sided parapneumonic effusion, empyema  **SEPSIS RULED OUT** -Hypoxemia (resolved)-TCTS consulted -s/p pigtail chest tube on 01/03/2020 and was removed on 12/16. -Follow-up chest x-ray 12/17 without  pneumothorax.   -Thoracic surgery signed off 12/17, prn OP follow-up recommended -Initially was treated with IV Zosyn beginning on 12/3) and subsequently transitioned to Augmentin for a total of 6 weeks of treatment-completed antibiotics on 02/17/2020.   Physical deconditioning/gait disturbance -PT and OT recommending SNF. -PT documented slow shuffling steps while mobilizing -OT documenting patient requires minimal assistance to change gown, perform seated grooming and eating.  He is able to transfer to chair with light moderate assist to stand from elevated surface and minimal assist to transfer. -As of 1/24 PT documents improved progress in gait distance up to 40 feet using rolling walker-continue to recommend SNF  Hypertension -Controlled off antihypertensive medication  Severe protein calorie malnutrition Nutrition Problem: Severe Malnutrition Etiology: chronic illness (CVA) Signs/Symptoms: moderate fat depletion,severe fat depletion,moderate muscle depletion,severe muscle depletion Interventions: Ensure Enlive (each supplement provides 350kcal and 20 grams of protein),Magic cup,MVI -As of 12/23 patient able to feed self and eating meals well (between 50% and 100% of meals over the past 24 hours) Estimated body mass index is 21.13 kg/m as calculated from the following:   Height as of this encounter: 5\' 10"  (1.778 m).   Weight as of this encounter: 66.8 kg.   Wounds: Incision (Closed) 01/03/20 (Active)  Date First Assessed/Time First Assessed: 01/03/20 2000      Assessments 01/03/2020  8:00 PM 02/23/2020  8:38 AM  Dressing Type - None  Dressing Dry;Clean;Intact Clean;Dry;Intact  Dressing Change Frequency - PRN  Site / Wound Assessment Dressing in place / Unable to assess Dressing in place / Unable to assess  Drainage Description Serosanguineous -     No Linked orders to display     Pressure Injury 02/12/20 Sacrum Mid Stage 2 -  Partial thickness loss of dermis presenting as a  shallow open injury with a red, pink wound bed without slough. (Active)  Date First Assessed/Time First Assessed: 02/12/20 1930   Location: Sacrum  Location Orientation: Mid  Staging: Stage 2 -  Partial thickness loss of dermis presenting as a shallow open injury with a red, pink wound bed without slough.    Assessments 02/12/2020  9:23 PM 02/23/2020  9:00 PM  Dressing Type Foam - Lift dressing to assess site every shift Foam - Lift dressing to assess site every shift  Dressing Changed Clean;Dry  Dressing Change Frequency - PRN  Site / Wound Assessment Clean;Dry -  Drainage Amount None -     No Linked orders to display     Other problems: Acute kidney injury -Resolved -Current creatinine 1.01 with a GFR greater than 60  History of CVA/dementia without behavioral disturbances -Remains normotensive off of BP medication -Continue Plavix and low-dose aspirin -Continue statin/Crestor -Continue Cymbalta  Anemia of chronic disease -Hemoglobin stable around 10   Data Reviewed: Basic Metabolic Panel: No results for input(s): NA, K, CL, CO2, GLUCOSE, BUN, CREATININE, CALCIUM, MG, PHOS in the last 168 hours. Liver Function Tests: No results for input(s): AST, ALT, ALKPHOS, BILITOT, PROT, ALBUMIN in the last 168 hours. No results for input(s): LIPASE, AMYLASE in the last 168 hours. No results for input(s): AMMONIA in the last 168 hours. CBC: No results for input(s): WBC, NEUTROABS, HGB, HCT, MCV, PLT in the last 168 hours. Cardiac Enzymes: No results for input(s): CKTOTAL, CKMB, CKMBINDEX, TROPONINI in the last 168 hours.  BNP (last 3 results) Recent Labs    12/26/19 1510 01/01/20 1018  BNP 88.7 177.1*    ProBNP (last 3 results) No results for input(s): PROBNP in the last 8760 hours.  CBG: No results for input(s): GLUCAP in the last 168 hours.  No results found for this or any previous visit (from the past 240 hour(s)).   Studies: No results found.  Scheduled Meds: .  aspirin EC  81 mg Oral Daily  . clopidogrel  75 mg Oral Daily  . DULoxetine  60 mg Oral Daily  . enoxaparin (LOVENOX) injection  40 mg Subcutaneous Daily  . feeding supplement  237 mL Oral TID BM  . multivitamin with minerals  1 tablet Oral Daily  . rosuvastatin  10 mg Oral Daily   Continuous Infusions:   Active Problems:   Empyema lung ([place])   Severe sepsis without septic shock ([place])   History of CVA in adulthood   Physical deconditioning   Dementia associated with other underlying disease without behavioral disturbance ([place])   Pressure injury of skin   Consultants:  TCTS  Conventional radiology  Procedures:  Chest tube placement 12/3 by IR  Antibiotics: Anti-infectives (From admission, onward)   Start     Dose/Rate Route Frequency Ordered Stop   02/13/20 0000  amoxicillin-clavulanate (AUGMENTIN) 875-125 MG tablet        1 tablet Oral Every 12 hours 02/13/20 1316 02/14/20 2359   02/05/20 1430  amoxicillin-clavulanate (AUGMENTIN) 875-125 MG per tablet 1 tablet        1 tablet Oral Every 12 hours 02/05/20 1338 02/16/20 2105   01/12/20 1400  metroNIDAZOLE (FLAGYL) tablet 500 mg  Status:  Discontinued        500 mg Oral Every 8 hours 01/12/20 1111 02/05/20 1337   01/12/20 1300  cefTRIAXone (ROCEPHIN) 2 g in sodium chloride 0.9 % 100 mL IVPB  Status:  Discontinued        2 g 200 mL/hr over 30 Minutes Intravenous Every 24 hours 01/12/20 1111 02/05/20 1337   01/02/20 2330  piperacillin-tazobactam (ZOSYN) IVPB 3.375 g  Status:  Discontinued        3.375 g 12.5 mL/hr over 240 Minutes Intravenous Every 8 hours 01/02/20 2242 01/12/20 1111       Time spent: 20 minutes    Erin Hearing ANP  [facility] 7 am- 330 pm/M-F for direct patient care and secure chat Please refer to Amion for contact information 53 days

## 2020-02-24 NOTE — Progress Notes (Signed)
Physical Therapy Treatment Patient Details Name: [name] [name] MRN: [id number] DOB: 05-14-1940 Today's Date: 02/24/2020    History of Present Illness 80 year old male with history of recent CVA in 11/2019 with residual dysarthria, hypertension, hyperlipidemia and spinal stenosis, initially presented to [facility] from [facility] SNF with confusion and difficulty breathing.  Work-up revealed right-sided loculated pleural effusion with concern for empyema. Transfer to [facility]. Patient was evaluated by thoracic surgery who recommended chest tube placement by IR, that was performed on 01/03/2020.  ID consulted and recommended transitioning from IV Zosyn to IV ceftriaxone and oral Flagyl while in-house and discharge on p.o. Augmentin for a total of 6 weeks with start date of 01/03/2020. Right-sided chest tube removed 12/16.    PT Comments    Pt up in chair on arrival, agreeable to therapy session and with good participation. Pt making good progress toward functional mobility goals, able to progress gait distance to 49ft using RW and minA with chair follow for safety, of note pt with high fall risk due to gait speed <0.4 m/s (grossly <0.1 m/s). Pt performed seated/reclined BLE AROM therapeutic exercises with increased time needed to process/perform and dense cues. Pt will continue to benefit from skilled rehab in a post acute setting to maximize functional gains before returning home.   Follow Up Recommendations  SNF     Equipment Recommendations  Rolling walker with 5" wheels;3in1 (PT)    Recommendations for Other Services       Precautions / Restrictions Precautions Precautions: Fall Precaution Comments: incontinent Restrictions Weight Bearing Restrictions: No    Mobility  Bed Mobility               General bed mobility comments: pt up in chair pre/post  Transfers Overall transfer level: Needs assistance Equipment used: Rolling walker (2 wheeled) Transfers: Sit to/from Stand Sit  to Stand: Min assist         General transfer comment: from chair height to RW x2 and from toilet height to RW x1, using wall rail  Ambulation/Gait Ambulation/Gait assistance: Min assist Gait Distance (Feet): 40 Feet (36ft to toilet, then 78ft in room) Assistive device: Rolling walker (2 wheeled) Gait Pattern/deviations: Step-to pattern;Shuffle;Decreased stride length;Trunk flexed;Decreased step length - left Gait velocity: grossly <0.1 m/s Gait velocity interpretation: <1.31 ft/sec, indicative of household ambulator General Gait Details: pt at times needs minA for RW management and mod cues for improved step length on LLE; very slow cadence   Stairs             Wheelchair Mobility    Modified Rankin (Stroke Patients Only)       Balance Overall balance assessment: Needs assistance Sitting-balance support: Feet supported Sitting balance-Leahy Scale: Fair     Standing balance support: Bilateral upper extremity supported;During functional activity Standing balance-Leahy Scale: Poor Standing balance comment: reliant on B UE support of RW and external assist                            Cognition Arousal/Alertness: Awake/alert Behavior During Therapy: Flat affect Overall Cognitive Status: History of cognitive impairments - at baseline Area of Impairment: Attention;Awareness;Problem solving                 Orientation Level:  (NT)     Following Commands: Follows one step commands with increased time Safety/Judgement: Decreased awareness of safety;Decreased awareness of deficits Awareness: Intellectual Problem Solving: Slow processing;Requires verbal cues;Requires tactile cues;Difficulty sequencing;Decreased initiation General Comments: slow  initiation and processing speed      Exercises General Exercises - Lower Extremity Ankle Circles/Pumps: AROM;10 reps;Seated Long Arc Quad: Strengthening;Both;10 reps;Seated Heel Slides:  AROM;Strengthening;Both;10 reps;Other (comment) (long sit in chair) Hip ABduction/ADduction: Strengthening;Both;10 reps;Other (comment);AROM (long sit in chair)    General Comments General comments (skin integrity, edema, etc.): VSS per chart review and pt denies dizziness with standing tasks and no acute s/sx distress; vitals not otherwise assessed      Pertinent Vitals/Pain Pain Assessment: No/denies pain Faces Pain Scale: No hurt Pain Intervention(s): Monitored during session;Repositioned    Home Living                      Prior Function            PT Goals (current goals can now be found in the care plan section) Acute Rehab PT Goals Patient Stated Goal: get stronger PT Goal Formulation: With patient Time For Goal Achievement: 03/05/20 Potential to Achieve Goals: Fair Progress towards PT goals: Progressing toward goals    Frequency    Min 2X/week      PT Plan Current plan remains appropriate    Co-evaluation              AM-PAC PT "6 Clicks" Mobility   Outcome Measure  Help needed turning from your back to your side while in a flat bed without using bedrails?: A Little Help needed moving from lying on your back to sitting on the side of a flat bed without using bedrails?: A Lot Help needed moving to and from a bed to a chair (including a wheelchair)?: A Lot Help needed standing up from a chair using your arms (e.g., wheelchair or bedside chair)?: A Little Help needed to walk in hospital room?: A Little Help needed climbing 3-5 steps with a railing? : A Lot 6 Click Score: 15    End of Session Equipment Utilized During Treatment: Gait belt Activity Tolerance: Patient tolerated treatment well Patient left: in chair;with call bell/phone within reach;with chair alarm set (pt lunch set up in front for him) Nurse Communication: Mobility status PT Visit Diagnosis: Difficulty in walking, not elsewhere classified (R26.2);Muscle weakness (generalized)  (M62.81)     Time: 7867-5449 PT Time Calculation (min) (ACUTE ONLY): 35 min  Charges:  $Gait Training: 8-22 mins $Therapeutic Exercise: 8-22 mins                     [name] [name] P., PTA Acute Rehabilitation Services Pager: [phone] Office: [place] 02/24/2020, 12:31 PM

## 2020-02-25 NOTE — Progress Notes (Signed)
Occupational Therapy Treatment Patient Details Name: [name] [name] MRN: [id number] DOB: [birth year] Today's Date: 02/25/2020    History of present illness 80 year old male with history of recent CVA in 11/2019 with residual dysarthria, hypertension, hyperlipidemia and spinal stenosis, initially presented to [facility] from [facility] SNF with confusion and difficulty breathing.  Work-up revealed right-sided loculated pleural effusion with concern for empyema. Transfer to [facility]. Patient was evaluated by thoracic surgery who recommended chest tube placement by IR, that was performed on 01/03/2020.  ID consulted and recommended transitioning from IV Zosyn to IV ceftriaxone and oral Flagyl while in-house and discharge on p.o. Augmentin for a total of 6 weeks with start date of 01/03/2020. Right-sided chest tube removed 12/16.   OT comments  Pt making steady progress towards OT goals this session. Pt received seated in recliner agreeable to OT intervention. Pt able to complete household distance functional mobility with RW and MIN A however once pt at doorway pt reports needing to void bowels. Pt able to ambulate to bathroom in similar fashion, total A for pericare in standing. Pt completed multiple sit<>stands from toilet with [place] d/t urgency. Pt left up in recliner with chair alarm activated and all needs within reach. Pt continues to present with slow initiation and processing speed but following all command with increased time. Pt would continue to benefit from skilled occupational therapy while admitted and after d/c to address the below listed limitations in order to improve overall functional mobility and facilitate independence with BADL participation. DC plan remains appropriate, will follow acutely per POC.     Follow Up Recommendations  SNF;Supervision/Assistance - 24 hour    Equipment Recommendations  None recommended by OT    Recommendations for Other Services      Precautions /  Restrictions Precautions Precautions: Fall Precaution Comments: incontinent Restrictions Weight Bearing Restrictions: No       Mobility Bed Mobility               General bed mobility comments: pt OOB in recliner upon arrival and returned to recliner at end of session  Transfers Overall transfer level: Needs assistance Equipment used: Rolling walker (2 wheeled) Transfers: Sit to/from Stand Sit to Stand: Min assist         General transfer comment: pt completed multiple sit<>stands from recliner with MIN A to power into standing and assist to control descent into chair or onto toilet, cues for hand placement    Balance Overall balance assessment: Needs assistance         Standing balance support: Single extremity supported;During functional activity Standing balance-Leahy Scale: Poor Standing balance comment: at least one BUE supported during functional tasks                           ADL either performed or assessed with clinical judgement   ADL Overall ADL's : Needs assistance/impaired             Lower Body Bathing: Total assistance;Sit to/from stand Lower Body Bathing Details (indicate cue type and reason): simulated via toileting hygiene in standing after incontinent BM Upper Body Dressing : Minimal assistance;Sitting Upper Body Dressing Details (indicate cue type and reason): to don gown as back side cover     Toilet Transfer: Minimal assistance;RW;Ambulation;Comfort height toilet;Grab bars Toilet Transfer Details (indicate cue type and reason): pt able to ambulate into BR, cues to use grab bars and MIN A to power up from toilet and control descent onto  toilet Toileting- Clothing Manipulation and Hygiene: Total assistance;Sit to/from stand Toileting - Clothing Manipulation Details (indicate cue type and reason): for posterior pericare in standing     Functional mobility during ADLs: Minimal assistance;Rolling walker General ADL Comments:  pt continues to present with decreased activity tolerance, generalized weakness and impaired balance. mobility limited by incontinence this session     Vision       Perception     Praxis      Cognition Arousal/Alertness: Awake/alert Behavior During Therapy: Flat affect Overall Cognitive Status: History of cognitive impairments - at baseline                                 General Comments: slow initiation and processing speed        Exercises     Shoulder Instructions       General Comments      Pertinent Vitals/ Pain       Pain Assessment: No/denies pain  Home Living                                          Prior Functioning/Environment              Frequency  Min 2X/week        Progress Toward Goals  OT Goals(current goals can now be found in the care plan section)  Progress towards OT goals: Progressing toward goals  Acute Rehab OT Goals Patient Stated Goal: get stronger OT Goal Formulation: With patient Time For Goal Achievement: 02/26/20 Potential to Achieve Goals: [place] Discharge plan remains appropriate;Frequency remains appropriate    Co-evaluation                 AM-PAC OT "6 Clicks" Daily Activity     Outcome Measure   Help from another person eating meals?: A Little Help from another person taking care of personal grooming?: A Little Help from another person toileting, which includes using toliet, bedpan, or urinal?: A Lot Help from another person bathing (including washing, rinsing, drying)?: A Lot Help from another person to put on and taking off regular upper body clothing?: A Little Help from another person to put on and taking off regular lower body clothing?: A Little 6 Click Score: 16    End of Session Equipment Utilized During Treatment: Gait belt;Rolling walker  OT Visit Diagnosis: Unsteadiness on feet (R26.81);Other abnormalities of gait and mobility (R26.89);Muscle weakness  (generalized) (M62.81);Other symptoms and signs involving cognitive function   Activity Tolerance Patient tolerated treatment well;Other (comment) (incontinence)   Patient Left in chair;with call bell/phone within reach;with chair alarm set   Nurse Communication Mobility status        Time: 6314-9702 OT Time Calculation (min): 26 min  Charges: OT General Charges $OT Visit: 1 Visit OT Treatments $Self Care/Home Management : 23-37 mins  Harley Alto., COTA/L Acute Rehabilitation Services [phone]    Precious Haws 02/25/2020, 4:41 PM

## 2020-02-25 NOTE — Progress Notes (Signed)
PROGRESS NOTE  [name] [name] RSW:[id number] DOB: 18-Feb-1940 DOA: 01/02/2020 PCP: [name], [name] G, MD  HPI/Recap of past 35 hours:  80 year old male with history of recent CVA in 11/2019 with residual dysarthria, hypertension, hyperlipidemia and spinal stenosis, initially presented to [facility] from [facility] SNF with confusion and difficulty breathing. Work-up revealed right-sided loculated pleural effusion with concern for empyema. IR at [facility] attempted ultrasound-guided chest tube placement but were unable due to thickness of pleural fluid. He was evaluated by general surgery at [facility] who recommended transfer to [facility] for thoracic surgery evaluation. Patient was evaluated by thoracic surgery who recommended chest tube placement by IR, that was performed on 01/03/2020. ID consulted and recommended transitioning from IV Zosyn to IV ceftriaxone and oral Flagyl while in-house and discharge on p.o. Augmentin for a total of 6 weeks with start date of 01/03/2020. Right-sided chest tube removed 12/16. TCTS signed off 12/17 and advised no outpatient follow-up needed.  Patient stable for discharge.  PT/OT recommending SNF.  Patient will need long-term Medicaid in place before can discharge.  Subjective: February 25, 2020 Patient seen and examined at bedside denies any complaints February 23, 2020: Patient seen and examined at bedside Patient denies any new complain  February 22, 2020 Patient seen and examined at bedside he denies any pain  Assessment/Plan: Active Problems:   Empyema lung ([place])   Severe sepsis without septic shock ([place])   History of CVA in adulthood   Physical deconditioning   Dementia associated with other underlying disease without behavioral disturbance ([place])   Pressure injury of skin  #1 empyema status post drainage right-sided chest tube was placed on January 03, 2020 and removed January 16, 2020.  2.  Acute hypoxemic respiratory failure secondary to  right-sided parapneumonic effusion empyema resolved now after drainage  3.   deconditioning continue PT and OT. PT OT recommend SNF waiting for SNF  4.  Hypertension controlled continue current medicine  5.  Severe protein malnutrition.  Body max   index is estimated to be 21 continue nutritional supplements  6.  Sepsis ruled out patient initially was on IV Zosyn started December 3 and subsequently transitioned to Augmentin for total of 6 weeks of treatment which was completed February 17, 2020  7. Sacral ulcer present on admission  Pressure Injury 02/12/20 Sacrum Mid Stage 2 -  Partial thickness loss of dermis presenting as a shallow open injury with a red, pink wound bed without slough. (Active)  02/12/20 1930  Location: Sacrum  Location Orientation: Mid  Staging: Stage 2 -  Partial thickness loss of dermis presenting as a shallow open injury with a red, pink wound bed without slough.  Wound Description (Comments):   Present on Admission:     Code Status: DNR  Severity of Illness: The appropriate patient status for this patient is INPATIENT. Inpatient status is judged to be reasonable and necessary in order to provide the required intensity of service to ensure the patient's safety. The patient's presenting symptoms, physical exam findings, and initial radiographic and laboratory data in the context of their chronic comorbidities is felt to place them at high risk for further clinical deterioration. Furthermore, it is not anticipated that the patient will be medically stable for discharge from the hospital within 2 midnights of admission. The following factors support the patient status of inpatient.   " The patient's presenting symptoms include infection empyema" The worrisome physical exam findings include acute infection needing drainage " The initial radiographic and laboratory  data are worrisome because of infection" The chronic co-morbidities include status post drainage  * I  certify that at the point of admission it is my clinical judgment that the patient will require inpatient hospital care spanning beyond 2 midnights from the point of admission due to high intensity of service, high risk for further deterioration and high frequency of surveillance required.*    Family Communication: Patient  Disposition Plan: SNF Barrier to discharge-awaiting bed at skilled nursing facility  Consultants:  Infectious disease  Thoracic surgery  IR  Procedures:  I&D chest tube January 03, 2020  Antimicrobials: Status post:  -Initially was treated with IV Zosyn beginning on 12/3) and subsequently transitioned to Augmentin for a total of 6 weeks of treatment-completed antibiotics on 02/17/2020.  DVT prophylaxis: Lovenox   Objective: Vitals:   02/24/20 0300 02/24/20 1422 02/24/20 2022 02/25/20 0408  BP: 121/83 128/77 125/75 116/79  Pulse: 64 64 68 61  Resp: 18  16 16   Temp: 97.9 F (36.6 C) 97.7 F (36.5 C) 97.6 F (36.4 C) (!) 97.4 F (36.3 C)  TempSrc: Oral Oral Oral Oral  SpO2: 99% 97% 98% 100%  Weight:      Height:        Intake/Output Summary (Last 24 hours) at 02/25/2020 1135 Last data filed at 02/25/2020 0428 Gross per 24 hour  Intake 365 ml  Output 500 ml  Net -135 ml   Filed Weights   01/03/20 1414  Weight: 66.8 kg   Body mass index is 21.13 kg/m.  Exam:  . General: 81 y.o. year-old male well developed well nourished in no acute distress.  Alert and oriented x3. . Cardiovascular: Regular rate and rhythm with no rubs or gallops.  No thyromegaly or JVD noted.   [name] Respiratory: Clear to auscultation with no wheezes or rales. Good inspiratory effort. . Abdomen: Soft nontender nondistended with normal bowel sounds x4 quadrants. . Musculoskeletal: No lower extremity edema. 2/4 pulses in all 4 extremities. . Skin: No ulcerative lesions noted or rashes, . Psychiatry: Mood is appropriate for condition and setting    Data  Reviewed: CBC: No results for input(s): WBC, NEUTROABS, HGB, HCT, MCV, PLT in the last 168 hours. Basic Metabolic Panel: No results for input(s): NA, K, CL, CO2, GLUCOSE, BUN, CREATININE, CALCIUM, MG, PHOS in the last 168 hours. GFR: Estimated Creatinine Clearance: 60.5 mL/min (by C-G formula based on SCr of 0.95 mg/dL). Liver Function Tests: No results for input(s): AST, ALT, ALKPHOS, BILITOT, PROT, ALBUMIN in the last 168 hours. No results for input(s): LIPASE, AMYLASE in the last 168 hours. No results for input(s): AMMONIA in the last 168 hours. Coagulation Profile: No results for input(s): INR, PROTIME in the last 168 hours. Cardiac Enzymes: No results for input(s): CKTOTAL, CKMB, CKMBINDEX, TROPONINI in the last 168 hours. BNP (last 3 results) No results for input(s): PROBNP in the last 8760 hours. HbA1C: No results for input(s): HGBA1C in the last 72 hours. CBG: No results for input(s): GLUCAP in the last 168 hours. Lipid Profile: No results for input(s): CHOL, HDL, LDLCALC, TRIG, CHOLHDL, LDLDIRECT in the last 72 hours. Thyroid Function Tests: No results for input(s): TSH, T4TOTAL, FREET4, T3FREE, THYROIDAB in the last 72 hours. Anemia Panel: No results for input(s): VITAMINB12, FOLATE, FERRITIN, TIBC, IRON, RETICCTPCT in the last 72 hours. Urine analysis:    Component Value Date/Time   COLORURINE YELLOW (A) 12/29/2019 1127   APPEARANCEUR HAZY (A) 12/29/2019 1127   APPEARANCEUR Cloudy 01/02/2012 1326   LABSPEC  1.019 12/29/2019 1127   LABSPEC 1.017 01/02/2012 1326   PHURINE 5.0 12/29/2019 1127   GLUCOSEU NEGATIVE 12/29/2019 1127   GLUCOSEU NEGATIVE 09/26/2017 1104   HGBUR NEGATIVE 12/29/2019 1127   [place] 12/29/2019 1127   BILIRUBINUR Negative 01/02/2012 [place] 12/29/2019 1127   PROTEINUR NEGATIVE 12/29/2019 1127   UROBILINOGEN 0.2 09/26/2017 1104   NITRITE NEGATIVE 12/29/2019 1127   LEUKOCYTESUR MODERATE (A) 12/29/2019 1127    LEUKOCYTESUR 3+ 01/02/2012 1326   Sepsis Labs: @LABRCNTIP (procalcitonin:4,lacticidven:4)  ) No results found for this or any previous visit (from the past 240 hour(s)).    Studies: No results found.  Scheduled Meds: . aspirin EC  81 mg Oral Daily  . clopidogrel  75 mg Oral Daily  . DULoxetine  60 mg Oral Daily  . enoxaparin (LOVENOX) injection  40 mg Subcutaneous Daily  . feeding supplement  237 mL Oral TID BM  . multivitamin with minerals  1 tablet Oral Daily  . rosuvastatin  10 mg Oral Daily     LOS: 40 days     Cristal Deer, MD [facility]  To reach me or the doctor on call, go to: [email or web address] Password [facility]  02/25/2020, 11:35 AM

## 2020-02-26 NOTE — TOC Progression Note (Signed)
Transition of Care [facility]) - Progression Note    Patient Details  Name: [name] [name] MRN: [id number] Date of Birth: 1940/07/28  Transition of Care [facility]) CM/SW [place], RN Phone Number: 02/26/2020, 1:37 PM  Clinical Narrative:    Case management called and left a message with Gayla Medicus, RNCM with [facility] to have the facility view the patient's clinicals for possible admission at their facility.  CM will continue to follow the patient for [facility] admission.   Expected Discharge Plan: Long Term Nursing Home Barriers to Discharge:  (Waiting on approval for [place] Medicaid)  Expected Discharge Plan and Services Expected Discharge Plan: [place]   Discharge Planning Services: CM Consult Post Acute Care Choice: [facility] Living arrangements for the past 2 months: [place]                                       Social Determinants of Health (SDOH) Interventions    Readmission Risk Interventions No flowsheet data found.

## 2020-02-26 NOTE — Progress Notes (Signed)
PROGRESS NOTE  [name] [name] AVW:[id number] DOB: 1940-07-30 DOA: 01/02/2020 PCP: [name], [name], MD  HPI/Recap of past 28 hours:  80 year old male with history of recent CVA in 11/2019 with residual dysarthria, hypertension, hyperlipidemia and spinal stenosis, initially presented to [facility] from [facility] SNF with confusion and difficulty breathing. Work-up revealed right-sided loculated pleural effusion with concern for empyema. IR at [facility] attempted ultrasound-guided chest tube placement but were unable due to thickness of pleural fluid. He was evaluated by general surgery at [facility] who recommended transfer to [facility] for thoracic surgery evaluation. Patient was evaluated by thoracic surgery who recommended chest tube placement by IR, that was performed on 01/03/2020. ID consulted and recommended transitioning from IV Zosyn to IV ceftriaxone and oral Flagyl while in-house and discharge on p.o. Augmentin for a total of 6 weeks with start date of 01/03/2020. Right-sided chest tube removed 12/16. TCTS signed off 12/17 and advised no outpatient follow-up needed.  Patient stable for discharge.  PT/OT recommending SNF.  Patient will need long-term Medicaid in place before can discharge.  Subjective: February 26, 2020 Patient seen and examined at bedside he denies any complaints he is aware that they are waiting for nursing home placement  February 25, 2020 Patient seen and examined at bedside denies any complaints February 23, 2020: Patient seen and examined at bedside Patient denies any new complain  February 22, 2020 Patient seen and examined at bedside he denies any pain  Assessment/Plan: Active Problems:   Empyema lung ([place])   Severe sepsis without septic shock ([place])   History of CVA in adulthood   Physical deconditioning   Dementia associated with other underlying disease without behavioral disturbance ([place])   Pressure injury of skin  #1 empyema status post drainage  right-sided chest tube was placed on January 03, 2020 and removed January 16, 2020.  2.  Acute hypoxemic respiratory failure secondary to right-sided parapneumonic effusion empyema resolved now after drainage  3.   deconditioning continue PT and OT. PT OT recommend SNF waiting for SNF  4.  Hypertension controlled continue current medicine  5.  Severe protein malnutrition.  Body max   index is estimated to be 21 continue nutritional supplements  6.  Sepsis ruled out patient initially was on IV Zosyn started December 3 and subsequently transitioned to Augmentin for total of 6 weeks of treatment which was completed February 17, 2020  7. Sacral ulcer present on admission  Pressure Injury 02/12/20 Sacrum Mid Stage 2 -  Partial thickness loss of dermis presenting as a shallow open injury with a red, pink wound bed without slough. (Active)  02/12/20 1930  Location: Sacrum  Location Orientation: Mid  Staging: Stage 2 -  Partial thickness loss of dermis presenting as a shallow open injury with a red, pink wound bed without slough.  Wound Description (Comments):   Present on Admission:     Code Status: DNR  Severity of Illness: The appropriate patient status for this patient is INPATIENT. Inpatient status is judged to be reasonable and necessary in order to provide the required intensity of service to ensure the patient's safety. The patient's presenting symptoms, physical exam findings, and initial radiographic and laboratory data in the context of their chronic comorbidities is felt to place them at high risk for further clinical deterioration. Furthermore, it is not anticipated that the patient will be medically stable for discharge from the hospital within 2 midnights of admission. The following factors support the patient status of inpatient.   "  The patient's presenting symptoms include infection empyema" The worrisome physical exam findings include acute infection needing drainage " The  initial radiographic and laboratory data are worrisome because of infection" The chronic co-morbidities include status post drainage  * I certify that at the point of admission it is my clinical judgment that the patient will require inpatient hospital care spanning beyond 2 midnights from the point of admission due to high intensity of service, high risk for further deterioration and high frequency of surveillance required.*    Family Communication: Patient  Disposition Plan: SNF Barrier to discharge-awaiting bed at skilled nursing facility  Consultants:  Infectious disease  Thoracic surgery  IR  Procedures:  I&D chest tube January 03, 2020  Antimicrobials: Status post:  -Initially was treated with IV Zosyn beginning on 12/3) and subsequently transitioned to Augmentin for a total of 6 weeks of treatment-completed antibiotics on 02/17/2020.  DVT prophylaxis: Lovenox   Objective: Vitals:   02/25/20 1431 02/25/20 1940 02/26/20 0440 02/26/20 0440  BP: 118/74 127/70 106/69 106/69  Pulse: 74 65 65 65  Resp: 17 18 18 18   Temp: (!) 97.5 F (36.4 C) 98 F (36.7 C) 98.2 F (36.8 C) 98.2 F (36.8 C)  TempSrc: Oral Oral Oral Oral  SpO2: 100% 100% 98% 98%  Weight:      Height:        Intake/Output Summary (Last 24 hours) at 02/26/2020 0947 Last data filed at 02/26/2020 0442 Gross per 24 hour  Intake 1114 ml  Output 1400 ml  Net -286 ml   Filed Weights   01/03/20 1414  Weight: 66.8 kg   Body mass index is 21.13 kg/m.  Exam:  . General: 80 y.o. year-old male well developed well nourished in no acute distress.  Alert and oriented x3. . Cardiovascular: Regular rate and rhythm with no rubs or gallops.  No thyromegaly or JVD noted.   [name] Respiratory: Clear to auscultation with no wheezes or rales. Good inspiratory effort. . Abdomen: Soft nontender nondistended with normal bowel sounds x4 quadrants. . Musculoskeletal: No lower extremity edema. 2/4 pulses in all 4  extremities. . Skin: No ulcerative lesions noted or rashes, . Psychiatry: Mood is appropriate for condition and setting    Data Reviewed: CBC: No results for input(s): WBC, NEUTROABS, HGB, HCT, MCV, PLT in the last 168 hours. Basic Metabolic Panel: No results for input(s): NA, K, CL, CO2, GLUCOSE, BUN, CREATININE, CALCIUM, MG, PHOS in the last 168 hours. GFR: Estimated Creatinine Clearance: 60.5 mL/min (by C-G formula based on SCr of 0.95 mg/dL). Liver Function Tests: No results for input(s): AST, ALT, ALKPHOS, BILITOT, PROT, ALBUMIN in the last 168 hours. No results for input(s): LIPASE, AMYLASE in the last 168 hours. No results for input(s): AMMONIA in the last 168 hours. Coagulation Profile: No results for input(s): INR, PROTIME in the last 168 hours. Cardiac Enzymes: No results for input(s): CKTOTAL, CKMB, CKMBINDEX, TROPONINI in the last 168 hours. BNP (last 3 results) No results for input(s): PROBNP in the last 8760 hours. HbA1C: No results for input(s): HGBA1C in the last 72 hours. CBG: No results for input(s): GLUCAP in the last 168 hours. Lipid Profile: No results for input(s): CHOL, HDL, LDLCALC, TRIG, CHOLHDL, LDLDIRECT in the last 72 hours. Thyroid Function Tests: No results for input(s): TSH, T4TOTAL, FREET4, T3FREE, THYROIDAB in the last 72 hours. Anemia Panel: No results for input(s): VITAMINB12, FOLATE, FERRITIN, TIBC, IRON, RETICCTPCT in the last 72 hours. Urine analysis:    Component Value Date/Time  COLORURINE YELLOW (A) 12/29/2019 1127   APPEARANCEUR HAZY (A) 12/29/2019 1127   APPEARANCEUR Cloudy 01/02/2012 1326   LABSPEC 1.019 12/29/2019 1127   LABSPEC 1.017 01/02/2012 1326   PHURINE 5.0 12/29/2019 1127   GLUCOSEU NEGATIVE 12/29/2019 1127   GLUCOSEU NEGATIVE 09/26/2017 1104   HGBUR NEGATIVE 12/29/2019 1127   BILIRUBINUR NEGATIVE 12/29/2019 1127   BILIRUBINUR Negative 01/02/2012 [place] 12/29/2019 1127   PROTEINUR NEGATIVE  12/29/2019 1127   UROBILINOGEN 0.2 09/26/2017 1104   NITRITE NEGATIVE 12/29/2019 1127   LEUKOCYTESUR MODERATE (A) 12/29/2019 1127   LEUKOCYTESUR 3+ 01/02/2012 1326   Sepsis Labs: @LABRCNTIP (procalcitonin:4,lacticidven:4)  ) No results found for this or any previous visit (from the past 240 hour(s)).    Studies: No results found.  Scheduled Meds: . aspirin EC  81 mg Oral Daily  . clopidogrel  75 mg Oral Daily  . DULoxetine  60 mg Oral Daily  . enoxaparin (LOVENOX) injection  40 mg Subcutaneous Daily  . feeding supplement  237 mL Oral TID BM  . multivitamin with minerals  1 tablet Oral Daily  . rosuvastatin  10 mg Oral Daily     LOS: 21 days     Cristal Deer, MD [facility]  To reach me or the doctor on call, go to: [email or web address] Password [facility]  02/26/2020, 9:47 AM

## 2020-02-27 NOTE — Progress Notes (Addendum)
1pm: CSW spoke with Lollie Sails at [place] to inquire about this patient's MQB Medicaid. Minna Merritts states this type of Medicaid does not cover placement and the patient's case needs a program change. Minna Merritts to have adult Medicaid program manager return call to [place] with further clarity.  8:30am: CSW reached out to DSS worker in attempt to obtain this patient's Medicaid number- message left for Herbie Baltimore at [place] [phone]).  CSW sent e-mail to inquire about this patient's Medicaid to Shanon Rosser of [facility] financial counseling.  Madilyn Fireman, MSW, LCSW-A Transitions of Care  Clinical Social Worker I [phone]

## 2020-02-27 NOTE — Progress Notes (Signed)
Occupational Therapy Treatment Patient Details Name: [name] [name] MRN: [id number] DOB: 1940-11-15 Today's Date: 02/27/2020    History of present illness 80 year old male with history of recent CVA in 11/2019 with residual dysarthria, hypertension, hyperlipidemia and spinal stenosis, initially presented to [facility] from [facility] SNF with confusion and difficulty breathing.  Work-up revealed right-sided loculated pleural effusion with concern for empyema. Transfer to [facility]. Patient was evaluated by thoracic surgery who recommended chest tube placement by IR, that was performed on 01/03/2020.  ID consulted and recommended transitioning from IV Zosyn to IV ceftriaxone and oral Flagyl while in-house and discharge on p.o. Augmentin for a total of 6 weeks with start date of 01/03/2020. Right-sided chest tube removed 12/16.   OT comments  Pt assisted OOB to chair for self feeding. Mobility requiring min assist, set up for self feeding. Pt continues to demonstrate slow processing and movement. Updated goals frequency to weekly.  Follow Up Recommendations  SNF;Supervision/Assistance - 24 hour    Equipment Recommendations  None recommended by OT    Recommendations for Other Services      Precautions / Restrictions Precautions Precautions: Fall Precaution Comments: incontinent       Mobility Bed Mobility Overal bed mobility: Needs Assistance Bed Mobility: Supine to Sit     Supine to sit: Min assist     General bed mobility comments: pulled up on therapist's hand  Transfers Overall transfer level: Needs assistance Equipment used: Rolling walker (2 wheeled) Transfers: Sit to/from [facility] Sit to Stand: Min assist Stand pivot transfers: Min assist       General transfer comment: cues for technique assist to rise and control descent    Balance Overall balance assessment: Needs assistance Sitting-balance support: Feet supported Sitting balance-Leahy Scale:  Fair     Standing balance support: Bilateral upper extremity supported Standing balance-Leahy Scale: Poor                             ADL either performed or assessed with clinical judgement   ADL Overall ADL's : Needs assistance/impaired Eating/Feeding: Minimal assistance;Sitting Eating/Feeding Details (indicate cue type and reason): assisted to set up tray                                         Vision       Perception     Praxis      Cognition Arousal/Alertness: Awake/alert Behavior During Therapy: Flat affect Overall Cognitive Status: History of cognitive impairments - at baseline                                 General Comments: slow initiation and processing speed        Exercises     Shoulder Instructions       General Comments      Pertinent Vitals/ Pain       Pain Assessment: Faces Faces Pain Scale: No hurt  Home Living                                          Prior Functioning/Environment              Frequency  Min 1X/week  Progress Toward Goals  OT Goals(current goals can now be found in the care plan section)  Progress towards OT goals: Progressing toward goals  Acute Rehab OT Goals Patient Stated Goal: get stronger OT Goal Formulation: With patient Time For Goal Achievement: 03/11/20 Potential to Achieve Goals: Fair ADL Goals Pt Will Perform Grooming: with supervision;standing Pt Will Perform Upper Body Dressing: with supervision;sitting Pt Will Transfer to Toilet: with supervision;ambulating Pt Will Perform Toileting - Clothing Manipulation and hygiene: with min assist;sit to/from stand  Plan Discharge plan remains appropriate;Frequency needs to be updated    Co-evaluation                 AM-PAC OT "6 Clicks" Daily Activity     Outcome Measure   Help from another person eating meals?: A Little Help from another person taking care of personal  grooming?: A Little Help from another person toileting, which includes using toliet, bedpan, or urinal?: A Lot Help from another person bathing (including washing, rinsing, drying)?: A Lot Help from another person to put on and taking off regular upper body clothing?: A Little Help from another person to put on and taking off regular lower body clothing?: A Little 6 Click Score: 16    End of Session Equipment Utilized During Treatment: Gait belt;Rolling walker  OT Visit Diagnosis: Unsteadiness on feet (R26.81);Other abnormalities of gait and mobility (R26.89);Muscle weakness (generalized) (M62.81);Other symptoms and signs involving cognitive function   Activity Tolerance Patient tolerated treatment well   Patient Left in chair;with call bell/phone within reach;with chair alarm set   Nurse Communication          Time: [phone] OT Time Calculation (min): 19 min  Charges: OT General Charges $OT Visit: 1 Visit OT Treatments $Self Care/Home Management : 8-22 mins  Nestor Lewandowsky, OTR/L Acute Rehabilitation Services Pager: [phone] Office: [phone]   Malka So 02/27/2020, 10:16 AM

## 2020-02-27 NOTE — TOC Progression Note (Signed)
Transition of Care [facility]) - Progression Note    Patient Details  Name: [name] [name] MRN: [id number] Date of Birth: 05-Jun-1940  Transition of Care [facility]) CM/SW [place], RN Phone Number: 02/27/2020, 1:24 PM  Clinical Narrative:    Case management spoke with Gayla Medicus, CM at [place] and she has referred the patient to [facility] in [place], [place] for possible admission next week once an available bed is ready.  I spoke with Gwenyth Bouillon, CM at [facility] healthcare and clinicals, H&P, medications, facesheet and PT/OT notes were emailed securely to [email or web address].  CM and MSW will continue to follow the patient for [facility] needs and disposition to SNF.   Expected Discharge Plan: Long Term Nursing Home Barriers to Discharge:  (Waiting on approval for [place] Medicaid)  Expected Discharge Plan and Services Expected Discharge Plan: [place]   Discharge Planning Services: CM Consult Post Acute Care Choice: [place] Living arrangements for the past 2 months: [place]                                       Social Determinants of Health (SDOH) Interventions    Readmission Risk Interventions No flowsheet data found.

## 2020-02-27 NOTE — Progress Notes (Signed)
[facility] PROGRESS NOTE  [name] [name] [id number] DOB: 09-29-1940 DOA: 01/02/2020 PCP: [name], [name], MD  Status: Remains inpatient appropriate because:Unsafe d/c plan   Dispo:  Patient From:  Private residence  Planned Disposition:  SNF  Expected discharge date: 02/29/2020  Medically stable for discharge:   barriers to discharge: Needs SNF placement but Medicaid application in process and as of 1/12 reportedly has been approved.  [facility] had tentatively accepted patient but has had difficulty in family working with them and may need partial payment until [facility] approved.  Patient sister not willing to sign patient in for fear of being responsible for bill.  TOC has been unable to contact son.  Unconfirmed reports that patient received a sum of money that the son now has access to and may not be spending appropriately including on needs for patient.  [facility] director Mr. [name] made aware in the event we need to pursue guardianship for this patient.  Because of these concerns nurse case manager has contacted APS as of 1/13.  Also prior to this admission APS/DSS had been contacted due to concerns over signs of misuse of funds.   Code Status: DNR Family Communication: Son [name] [name] 1/07-TOC has made repeated calls to patient's son on 1/12, 1/13 and 1/14 without any response; TOC spoke with patient's sister on 1/13 DVT prophylaxis: Lovenox Vaccination status: 1/10 patient given first dose of Pfizer COVID-vaccine  Foley catheter:  No   HPI: 80 year old male with history of recent CVA in 11/2019 with residual dysarthria, hypertension, hyperlipidemia and spinal stenosis, initially presented to [facility] from [facility] with confusion and difficulty breathing. Work-up revealed right-sided loculated pleural effusion with concern for empyema. IR at [facility] attempted ultrasound-guided chest tube placement but were unable due to thickness of pleural fluid. He was evaluated by general  surgery at [facility] who recommended transfer to [facility] for thoracic surgery evaluation. Patient was evaluated by thoracic surgery who recommended chest tube placement by IR, that was performed on 01/03/2020. ID consulted and recommended transitioning from IV Zosyn to IV ceftriaxone and oral Flagyl while in-house and discharge on p.o. Augmentin for a total of 6 weeks with start date of 01/03/2020. Right-sided chest tube removed 12/16. TCTS signed off 12/17 and advised no outpatient follow-up needed.  Patient stable for discharge.  PT/OT recommending SNF.  Patient will need long-term Medicaid in place before can discharge.  Subjective: Sitting up in bed eating breakfast.  Had clean plate but had not touched oatmeal.  Note has untouched banana and patient typically likes banana and his oatmeal.  Banana sliced and placed into oatmeal and patient began eating.  No complaints verbalized.  Objective: Vitals:   02/26/20 2017 02/27/20 0536  BP: 137/71 132/76  Pulse: (!) 101 63  Resp: 16 16  Temp: 97.7 F (36.5 C) 97.7 F (36.5 C)  SpO2: 100% 100%    Intake/Output Summary (Last 24 hours) at 02/27/2020 0846 Last data filed at 02/27/2020 0210 Gross per 24 hour  Intake 240 ml  Output 1000 ml  Net -760 ml   Filed Weights   01/03/20 1414  Weight: 66.8 kg    Exam: Constitutional: Calm and in no acute distress Respiratory: Normal lung sounds that are clear, stable on room air Cardiovascular: Heart sounds S1-S2, nontachycardic pulse, extremities warm to touch Abdomen: Abdomen soft with normoactive bowel sounds.  Eating 75 to 100% of meals.  LBM 1/24 Neurologic: CN 2-12 grossly intact. Sensation intact,  Strength 5/5 x on left  slightly weaker 4/5 on the right Psychiatric: Alert and oriented x name with persistent flat affect   Assessment/Plan: Acute problems: Acute hypoxemic respiratory failure  2/2 right-sided parapneumonic effusion, empyema  **SEPSIS RULED OUT** -Hypoxemia  (resolved)-TCTS consulted -s/p pigtail chest tube on 01/03/2020 and was removed on 12/16. -Follow-up chest x-ray 12/17 without pneumothorax.   -Thoracic surgery signed off 12/17, prn OP follow-up recommended -Initially was treated with IV Zosyn beginning on 12/3) and subsequently transitioned to Augmentin for a total of 6 weeks of treatment-completed antibiotics on 02/17/2020.   Physical deconditioning/gait disturbance -PT and OT recommending SNF. -PT documented slow shuffling steps while mobilizing -OT documenting patient requires minimal assistance to change gown, perform seated grooming and eating.  He is able to transfer to chair with light moderate assist to stand from elevated surface and minimal assist to transfer. -As of 1/24 PT documents improved progress in gait distance up to 40 feet using rolling walker-continue to recommend SNF  Hypertension -Controlled off antihypertensive medication  Severe protein calorie malnutrition Nutrition Problem: Severe Malnutrition Etiology: chronic illness (CVA) Signs/Symptoms: moderate fat depletion,severe fat depletion,moderate muscle depletion,severe muscle depletion Interventions: Ensure Enlive (each supplement provides 350kcal and 20 grams of protein),Magic cup,MVI -As of 12/23 patient able to feed self and eating meals well (between 50% and 100% of meals over the past 24 hours) Estimated body mass index is 21.13 kg/m as calculated from the following:   Height as of this encounter: 5\' 10"  (1.778 m).   Weight as of this encounter: 66.8 kg.   Wounds: Incision (Closed) 01/03/20 (Active)  Date First Assessed/Time First Assessed: 01/03/20 2000      Assessments 01/03/2020  8:00 PM 02/23/2020  8:38 AM  Dressing Type - None  Dressing Dry;Clean;Intact Clean;Dry;Intact  Dressing Change Frequency - PRN  Site / Wound Assessment Dressing in place / Unable to assess Dressing in place / Unable to assess  Drainage Description Serosanguineous -     No  Linked orders to display     Pressure Injury 02/12/20 Sacrum Mid Stage 2 -  Partial thickness loss of dermis presenting as a shallow open injury with a red, pink wound bed without slough. (Active)  Date First Assessed/Time First Assessed: 02/12/20 1930   Location: Sacrum  Location Orientation: Mid  Staging: Stage 2 -  Partial thickness loss of dermis presenting as a shallow open injury with a red, pink wound bed without slough.    Assessments 02/12/2020  9:23 PM 02/26/2020  8:36 AM  Dressing Type Foam - Lift dressing to assess site every shift Foam - Lift dressing to assess site every shift  Dressing Changed -  Site / Wound Assessment Clean;Dry -  Drainage Amount None -     No Linked orders to display     Other problems: Acute kidney injury -Resolved -Current creatinine 1.01 with a GFR greater than 60  History of CVA/dementia without behavioral disturbances -Remains normotensive off of BP medication -Continue Plavix and low-dose aspirin -Continue statin/Crestor -Continue Cymbalta  Anemia of chronic disease -Hemoglobin stable around 10   Data Reviewed: Basic Metabolic Panel: No results for input(s): NA, K, CL, CO2, GLUCOSE, BUN, CREATININE, CALCIUM, MG, PHOS in the last 168 hours. Liver Function Tests: No results for input(s): AST, ALT, ALKPHOS, BILITOT, PROT, ALBUMIN in the last 168 hours. No results for input(s): LIPASE, AMYLASE in the last 168 hours. No results for input(s): AMMONIA in the last 168 hours. CBC: No results for input(s): WBC, NEUTROABS, HGB, HCT, MCV, PLT in the  last 168 hours. Cardiac Enzymes: No results for input(s): CKTOTAL, CKMB, CKMBINDEX, TROPONINI in the last 168 hours. BNP (last 3 results) Recent Labs    12/26/19 1510 01/01/20 1018  BNP 88.7 177.1*    ProBNP (last 3 results) No results for input(s): PROBNP in the last 8760 hours.  CBG: No results for input(s): GLUCAP in the last 168 hours.  No results found for this or any previous visit  (from the past 240 hour(s)).   Studies: No results found.  Scheduled Meds: . aspirin EC  81 mg Oral Daily  . clopidogrel  75 mg Oral Daily  . DULoxetine  60 mg Oral Daily  . enoxaparin (LOVENOX) injection  40 mg Subcutaneous Daily  . feeding supplement  237 mL Oral TID BM  . multivitamin with minerals  1 tablet Oral Daily  . rosuvastatin  10 mg Oral Daily   Continuous Infusions:   Active Problems:   Empyema lung ([place])   Severe sepsis without septic shock ([place])   History of CVA in adulthood   Physical deconditioning   Dementia associated with other underlying disease without behavioral disturbance ([place])   Pressure injury of skin   Consultants:  TCTS  Conventional radiology  Procedures:  Chest tube placement 12/3 by IR  Antibiotics: Anti-infectives (From admission, onward)   Start     Dose/Rate Route Frequency Ordered Stop   02/13/20 0000  amoxicillin-clavulanate (AUGMENTIN) 875-125 MG tablet        1 tablet Oral Every 12 hours 02/13/20 1316 02/14/20 2359   02/05/20 1430  amoxicillin-clavulanate (AUGMENTIN) 875-125 MG per tablet 1 tablet        1 tablet Oral Every 12 hours 02/05/20 1338 02/16/20 2105   01/12/20 1400  metroNIDAZOLE (FLAGYL) tablet 500 mg  Status:  Discontinued        500 mg Oral Every 8 hours 01/12/20 1111 02/05/20 1337   01/12/20 1300  cefTRIAXone (ROCEPHIN) 2 g in sodium chloride 0.9 % 100 mL IVPB  Status:  Discontinued        2 g 200 mL/hr over 30 Minutes Intravenous Every 24 hours 01/12/20 1111 02/05/20 1337   01/02/20 2330  piperacillin-tazobactam (ZOSYN) IVPB 3.375 g  Status:  Discontinued        3.375 g 12.5 mL/hr over 240 Minutes Intravenous Every 8 hours 01/02/20 2242 01/12/20 1111       Time spent: 20 minutes    Erin Hearing ANP  [facility] 7 am- 330 pm/M-F for direct patient care and secure chat Please refer to Amion for contact information 56 days

## 2020-02-27 NOTE — Progress Notes (Signed)
Physical Therapy Treatment Patient Details Name: [name] [name] MRN: [id number] DOB: 08/10/1940 Today's Date: 02/27/2020    History of Present Illness 80 year old male with history of recent CVA in 11/2019 with residual dysarthria, hypertension, hyperlipidemia and spinal stenosis, initially presented to [facility] from [facility] SNF with confusion and difficulty breathing.  Work-up revealed right-sided loculated pleural effusion with concern for empyema. Transfer to [facility]. Patient was evaluated by thoracic surgery who recommended chest tube placement by IR, that was performed on 01/03/2020.  ID consulted and recommended transitioning from IV Zosyn to IV ceftriaxone and oral Flagyl while in-house and discharge on p.o. Augmentin for a total of 6 weeks with start date of 01/03/2020. Right-sided chest tube removed 12/16.    PT Comments    Pt up in chair, agreeable to therapy session and with good participation and tolerance for mobility. Pt min guard to minA for gait trial to/from bathroom and able to indicate when he needs to have BM and was dependent for peri-care after toileting. Pt able to static stand during cleanup with RW support and Supervision and needed minA for all transfers. Pt continues to need increased time to initiate and perform mobility tasks but has improving safety awareness and fair carryover of instructions given during session. Pt continues to benefit from PT services to progress toward functional mobility goals. Continue to recommend SNF.   Follow Up Recommendations  SNF     Equipment Recommendations  Rolling walker with 5" wheels;3in1 (PT)    Recommendations for Other Services       Precautions / Restrictions Precautions Precautions: Fall Precaution Comments: incontinent, but improving 1/27 Restrictions Weight Bearing Restrictions: No    Mobility  Bed Mobility Overal bed mobility: Needs Assistance Bed Mobility: Supine to Sit     Supine to sit: Min assist      General bed mobility comments: pt up in chair pre/post  Transfers Overall transfer level: Needs assistance Equipment used: Rolling walker (2 wheeled) Transfers: Sit to/from Stand Sit to Stand: Min assist Stand pivot transfers: Min assist       General transfer comment: from chair height to RW and from toilet height to RW x2 reps, using wall rail  Ambulation/Gait Ambulation/Gait assistance: Min assist Gait Distance (Feet): 24 Feet (37ft x2 with seated break) Assistive device: Rolling walker (2 wheeled) Gait Pattern/deviations: Step-to pattern;Shuffle;Decreased stride length;Trunk flexed;Decreased step length - left Gait velocity: grossly <0.1 m/s   General Gait Details: pt at times needs minA for RW management and mod cues for improved step length on LLE; very slow cadence   Stairs             Wheelchair Mobility    Modified Rankin (Stroke Patients Only)       Balance Overall balance assessment: Needs assistance Sitting-balance support: Feet supported Sitting balance-Leahy Scale: Fair     Standing balance support: Bilateral upper extremity supported;During functional activity Standing balance-Leahy Scale: Poor Standing balance comment: reliant on B UE support of RW and external assist                            Cognition Arousal/Alertness: Awake/alert Behavior During Therapy: Flat affect Overall Cognitive Status: History of cognitive impairments - at baseline Area of Impairment: Attention;Awareness;Problem solving                 Orientation Level:  (NT)     Following Commands: Follows one step commands with increased time Safety/Judgement: Decreased awareness of  safety;Decreased awareness of deficits Awareness: Intellectual Problem Solving: Slow processing;Requires verbal cues;Requires tactile cues;Difficulty sequencing;Decreased initiation General Comments: slow initiation and processing speed      Exercises General Exercises -  Lower Extremity Ankle Circles/Pumps: AROM;10 reps;Seated Long Arc Quad: Strengthening;Both;10 reps;Seated Heel Slides: AROM;Strengthening;Both;10 reps;Other (comment) (long sit in chair) Hip ABduction/ADduction: Strengthening;Both;10 reps;Other (comment);AROM (long sit in chair)    General Comments General comments (skin integrity, edema, etc.): pt able to indicate need for bathroom/BM and not incontinent this date, although male purewick catheter did leak while toileting and NT entering room at end of session to assist with bathing pt.      Pertinent Vitals/Pain Pain Assessment: No/denies pain Faces Pain Scale: No hurt Pain Intervention(s): Monitored during session    Home Living                      Prior Function            PT Goals (current goals can now be found in the care plan section) Acute Rehab PT Goals Patient Stated Goal: get stronger PT Goal Formulation: With patient Time For Goal Achievement: 03/05/20 Potential to Achieve Goals: Fair Progress towards PT goals: Progressing toward goals    Frequency    Min 2X/week      PT Plan Current plan remains appropriate    Co-evaluation              AM-PAC PT "6 Clicks" Mobility   Outcome Measure  Help needed turning from your back to your side while in a flat bed without using bedrails?: A Little Help needed moving from lying on your back to sitting on the side of a flat bed without using bedrails?: A Lot Help needed moving to and from a bed to a chair (including a wheelchair)?: A Lot Help needed standing up from a chair using your arms (e.g., wheelchair or bedside chair)?: A Little Help needed to walk in hospital room?: A Little Help needed climbing 3-5 steps with a railing? : A Lot 6 Click Score: 15    End of Session Equipment Utilized During Treatment: Gait belt Activity Tolerance: Patient tolerated treatment well Patient left: in chair;with call bell/phone within reach;with chair alarm set  (pt lunch set up in front for him) Nurse Communication: Mobility status PT Visit Diagnosis: Difficulty in walking, not elsewhere classified (R26.2);Muscle weakness (generalized) (M62.81)     Time: 1000-1024 PT Time Calculation (min) (ACUTE ONLY): 24 min  Charges:  $Gait Training: 8-22 mins $Therapeutic Activity: 8-22 mins                     Haylie Mccutcheon P., PTA Acute Rehabilitation Services Pager: [phone] Office: [place] 02/27/2020, 10:36 AM

## 2020-02-28 NOTE — TOC Progression Note (Addendum)
Transition of Care [facility]) - Progression Note    Patient Details  Name: [name] [name] MRN: [id number] Date of Birth: Jan 11, 1941  [facility] CM/SW [place], RN Phone Number: 02/28/2020, 11:04 AM  Clinical Narrative:    Case management called and spoke with Pennie Banter, CM at [facility] on the phone and the facility is willing to reaccept the patient for admission, since the family is agreeable to sign him in for admission.  [facility] has recently followed up with the family and they were agreeable to assume the responsibility of signing the patient into the SNF - considering the patient has dementia.  [facility] SN[facility] will have a long term SNF bed available on Monday, 03/02/2020 and they plan to call the patient's sister on the phone today to follow up.  CM and MSW will continue to follow for LTC SNF admission.  02/28/2020 1341-  Pennie Banter, CM with [facility] called back and states that the patient's sister was able to sign the patient in for admission at [facility] on Wednesday and the patient was able to transfer to the facility later Wednesday afternoon.   Expected Discharge Plan: Long Term Nursing Home Barriers to Discharge:  (Waiting on approval for [place] Medicaid)  Expected Discharge Plan and Services Expected Discharge Plan: [place]   Discharge Planning Services: CM Consult Post Acute Care Choice: [place] Living arrangements for the past 2 months: [place]                                       Social Determinants of Health (SDOH) Interventions    Readmission Risk Interventions No flowsheet data found.

## 2020-02-28 NOTE — Progress Notes (Signed)
[facility] PROGRESS NOTE  [name] [name] DXA:[id number] DOB: 05-31-1940 DOA: 01/02/2020 PCP: [name], [name], MD  Status: Remains inpatient appropriate because:Unsafe d/c plan   Dispo:  Patient From:  Private residence  Planned Disposition:  SNF  Expected discharge date: 02/29/2020  Medically stable for discharge:   barriers to discharge: Needs SNF placement but Medicaid application in process and as of 1/12 reportedly has been approved.  After multiple discussions with family and [facility] and is to discharge to that SNF next Wednesday when bed available and family has agreed to sign patient in.   Code Status: DNR Family Communication: Son [name] 1/07-TOC has made repeated calls to patient's son on 1/12, 1/13 and 1/14 without any response; TOC spoke with patient's sister on 1/13 DVT prophylaxis: Lovenox Vaccination status: 1/10 patient given first dose of Pfizer COVID-vaccine  Foley catheter:  No   HPI: 80 year old male with history of recent CVA in 11/2019 with residual dysarthria, hypertension, hyperlipidemia and spinal stenosis, initially presented to [facility] from [facility] SNF with confusion and difficulty breathing. Work-up revealed right-sided loculated pleural effusion with concern for empyema. IR at [facility] attempted ultrasound-guided chest tube placement but were unable due to thickness of pleural fluid. He was evaluated by general surgery at [facility] who recommended transfer to [facility] for thoracic surgery evaluation. Patient was evaluated by thoracic surgery who recommended chest tube placement by IR, that was performed on 01/03/2020. ID consulted and recommended transitioning from IV Zosyn to IV ceftriaxone and oral Flagyl while in-house and discharge on p.o. Augmentin for a total of 6 weeks with start date of 01/03/2020. Right-sided chest tube removed 12/16. TCTS signed off 12/17 and advised no outpatient follow-up needed.  Patient stable for discharge.   PT/OT recommending SNF.  Patient will need long-term Medicaid in place before can discharge.  Subjective: Awake, baseline mentation, eating breakfast in bed.  Objective: Vitals:   02/27/20 2121 02/28/20 0620  BP: 122/77 126/71  Pulse: 81 62  Resp: 16 16  Temp: 98.6 F (37 C) 97.7 F (36.5 C)  SpO2: 99% 99%    Intake/Output Summary (Last 24 hours) at 02/28/2020 0830 Last data filed at 02/27/2020 2030 Gross per 24 hour  Intake 480 ml  Output 300 ml  Net 180 ml   Filed Weights   01/03/20 1414  Weight: 66.8 kg    Exam: Constitutional: Calm, awake, no acute distress Respiratory: Anterior lung sounds clear, stable on room air Cardiovascular: Normal heart sounds, no tachycardia, extremities warm to touch Abdomen: Soft, nontender nondistended with normal active bowel sounds.  Feeds self although does take about 1 hour for patient to complete a meal.  LBM 1/27 Neurologic: CN 2-12 grossly intact. Sensation intact,  Strength 5/5 x on left slightly weaker 4/5 on the right Psychiatric: Awake with flat affect.  Oriented to name only   Assessment/Plan: Acute problems: Acute hypoxemic respiratory failure  2/2 right-sided parapneumonic effusion, empyema  **SEPSIS RULED OUT** -Hypoxemia (resolved)-TCTS consulted -s/p pigtail chest tube on 01/03/2020 and was removed on 12/16. -Follow-up chest x-ray 12/17 without pneumothorax.   -Thoracic surgery signed off 12/17, prn OP follow-up recommended -Initially was treated with IV Zosyn beginning on 12/3) and subsequently transitioned to Augmentin for a total of 6 weeks of treatment-completed antibiotics on 02/17/2020.   Physical deconditioning/gait disturbance -PT and OT recommending SNF. -PT documented slow shuffling steps while mobilizing -OT documenting patient requires minimal assistance to change gown, perform seated grooming and eating.  He is able to  transfer to chair with light moderate assist to stand from elevated surface and minimal  assist to transfer. -As of 1/24 PT documents improved progress in gait distance up to 40 feet using rolling walker-continue to recommend SNF  Hypertension -Controlled off antihypertensive medication  Severe protein calorie malnutrition Nutrition Problem: Severe Malnutrition Etiology: chronic illness (CVA) Signs/Symptoms: moderate fat depletion,severe fat depletion,moderate muscle depletion,severe muscle depletion Interventions: Ensure Enlive (each supplement provides 350kcal and 20 grams of protein),Magic cup,MVI -As of 12/23 patient able to feed self and eating meals well (between 50% and 100% of meals over the past 24 hours) Estimated body mass index is 21.13 kg/m as calculated from the following:   Height as of this encounter: 5\' 10"  (1.778 m).   Weight as of this encounter: 66.8 kg.   Wounds: Incision (Closed) 01/03/20 (Active)  Date First Assessed/Time First Assessed: 01/03/20 2000      Assessments 01/03/2020  8:00 PM 02/27/2020 10:00 AM  Dressing Type - None  Dressing Dry;Clean;Intact -  Site / Wound Assessment Dressing in place / Unable to assess -  Drainage Description Serosanguineous -     No Linked orders to display     Pressure Injury 02/12/20 Sacrum Mid Stage 2 -  Partial thickness loss of dermis presenting as a shallow open injury with a red, pink wound bed without slough. (Active)  Date First Assessed/Time First Assessed: 02/12/20 1930   Location: Sacrum  Location Orientation: Mid  Staging: Stage 2 -  Partial thickness loss of dermis presenting as a shallow open injury with a red, pink wound bed without slough.    Assessments 02/12/2020  9:23 PM 02/27/2020 10:00 AM  Dressing Type Foam - Lift dressing to assess site every shift Foam - Lift dressing to assess site every shift  Dressing Changed Dry;Clean  Dressing Change Frequency - PRN  Site / Wound Assessment Clean;Dry -  Margins - Attached edges (approximated)  Drainage Amount None None     No Linked orders to  display     Other problems: Acute kidney injury -Resolved -Current creatinine 1.01 with a GFR greater than 60  History of CVA/dementia without behavioral disturbances -Remains normotensive off of BP medication -Continue Plavix and low-dose aspirin -Continue statin/Crestor -Continue Cymbalta  Anemia of chronic disease -Hemoglobin stable around 10   Data Reviewed: Basic Metabolic Panel: No results for input(s): NA, K, CL, CO2, GLUCOSE, BUN, CREATININE, CALCIUM, MG, PHOS in the last 168 hours. Liver Function Tests: No results for input(s): AST, ALT, ALKPHOS, BILITOT, PROT, ALBUMIN in the last 168 hours. No results for input(s): LIPASE, AMYLASE in the last 168 hours. No results for input(s): AMMONIA in the last 168 hours. CBC: No results for input(s): WBC, NEUTROABS, HGB, HCT, MCV, PLT in the last 168 hours. Cardiac Enzymes: No results for input(s): CKTOTAL, CKMB, CKMBINDEX, TROPONINI in the last 168 hours. BNP (last 3 results) Recent Labs    12/26/19 1510 01/01/20 1018  BNP 88.7 177.1*    ProBNP (last 3 results) No results for input(s): PROBNP in the last 8760 hours.  CBG: No results for input(s): GLUCAP in the last 168 hours.  No results found for this or any previous visit (from the past 240 hour(s)).   Studies: No results found.  Scheduled Meds: . aspirin EC  81 mg Oral Daily  . clopidogrel  75 mg Oral Daily  . DULoxetine  60 mg Oral Daily  . enoxaparin (LOVENOX) injection  40 mg Subcutaneous Daily  . feeding supplement  237 mL  Oral TID BM  . multivitamin with minerals  1 tablet Oral Daily  . rosuvastatin  10 mg Oral Daily   Continuous Infusions:   Active Problems:   Empyema lung ([place])   Severe sepsis without septic shock ([place])   History of CVA in adulthood   Physical deconditioning   Dementia associated with other underlying disease without behavioral disturbance ([place])   Pressure injury of skin   Consultants:  TCTS  Conventional  radiology  Procedures:  Chest tube placement 12/3 by IR  Antibiotics: Anti-infectives (From admission, onward)   Start     Dose/Rate Route Frequency Ordered Stop   02/13/20 0000  amoxicillin-clavulanate (AUGMENTIN) 875-125 MG tablet        1 tablet Oral Every 12 hours 02/13/20 1316 02/14/20 2359   02/05/20 1430  amoxicillin-clavulanate (AUGMENTIN) 875-125 MG per tablet 1 tablet        1 tablet Oral Every 12 hours 02/05/20 1338 02/16/20 2105   01/12/20 1400  metroNIDAZOLE (FLAGYL) tablet 500 mg  Status:  Discontinued        500 mg Oral Every 8 hours 01/12/20 1111 02/05/20 1337   01/12/20 1300  cefTRIAXone (ROCEPHIN) 2 g in sodium chloride 0.9 % 100 mL IVPB  Status:  Discontinued        2 g 200 mL/hr over 30 Minutes Intravenous Every 24 hours 01/12/20 1111 02/05/20 1337   01/02/20 2330  piperacillin-tazobactam (ZOSYN) IVPB 3.375 g  Status:  Discontinued        3.375 g 12.5 mL/hr over 240 Minutes Intravenous Every 8 hours 01/02/20 2242 01/12/20 1111       Time spent: 20 minutes    Erin Hearing ANP  [facility] 7 am- 330 pm/M-F for direct patient care and secure chat Please refer to Amion for contact information 57 days

## 2020-02-29 NOTE — Progress Notes (Signed)
PROGRESS NOTE  [name] [name] ZOX:[id number] DOB: 1940-05-19 DOA: 01/02/2020 PCP: [name], [name], MD  HPI/Recap of past 19 hours:  80 year old male with history of recent CVA in 11/2019 with residual dysarthria, hypertension, hyperlipidemia and spinal stenosis, initially presented to [facility] from [facility] SNF with confusion and difficulty breathing. Work-up revealed right-sided loculated pleural effusion with concern for empyema. IR at [facility] attempted ultrasound-guided chest tube placement but were unable due to thickness of pleural fluid. He was evaluated by general surgery at [facility] who recommended transfer to [facility] for thoracic surgery evaluation. Patient was evaluated by thoracic surgery who recommended chest tube placement by IR, that was performed on 01/03/2020. ID consulted and recommended transitioning from IV Zosyn to IV ceftriaxone and oral Flagyl while in-house and discharge on p.o. Augmentin for a total of 6 weeks with start date of 01/03/2020. Right-sided chest tube removed 12/16. TCTS signed off 12/17 and advised no outpatient follow-up needed.  Patient stable for discharge.  PT/OT recommending SNF.  Patient will need long-term Medicaid in place before can discharge.  Subjective: February 29, 2020: Patient seen and examined at bedside he denies any complaints he is sitting in the recliner February 26, 2020 Patient seen and examined at bedside he denies any complaints he is aware that they are waiting for nursing home placement  February 25, 2020 Patient seen and examined at bedside denies any complaints February 23, 2020: Patient seen and examined at bedside Patient denies any new complain  February 22, 2020 Patient seen and examined at bedside he denies any pain  Assessment/Plan: Active Problems:   Empyema lung ([place])   Severe sepsis without septic shock ([place])   History of CVA in adulthood   Physical deconditioning   Dementia associated with other  underlying disease without behavioral disturbance ([place])   Pressure injury of skin  #1 empyema status post drainage right-sided chest tube was placed on January 03, 2020 and removed January 16, 2020.  2.  Acute hypoxemic respiratory failure secondary to right-sided parapneumonic effusion empyema resolved now after drainage  3.   deconditioning continue PT and OT. PT OT recommend SNF waiting for SNF  4.  Hypertension controlled continue current medicine  5.  Severe protein malnutrition.  Body max   index is estimated to be 21 continue nutritional supplements  6.  Sepsis ruled out patient initially was on IV Zosyn started December 3 and subsequently transitioned to Augmentin for total of 6 weeks of treatment which was completed February 17, 2020  7. Sacral ulcer present on admission  Pressure Injury 02/12/20 Sacrum Mid Stage 2 -  Partial thickness loss of dermis presenting as a shallow open injury with a red, pink wound bed without slough. (Active)  02/12/20 1930  Location: Sacrum  Location Orientation: Mid  Staging: Stage 2 -  Partial thickness loss of dermis presenting as a shallow open injury with a red, pink wound bed without slough.  Wound Description (Comments):   Present on Admission:     Code Status: DNR  Severity of Illness: The appropriate patient status for this patient is INPATIENT. Inpatient status is judged to be reasonable and necessary in order to provide the required intensity of service to ensure the patient's safety. The patient's presenting symptoms, physical exam findings, and initial radiographic and laboratory data in the context of their chronic comorbidities is felt to place them at high risk for further clinical deterioration. Furthermore, it is not anticipated that the patient will be medically stable for  discharge from the hospital within 2 midnights of admission. The following factors support the patient status of inpatient.   " The patient's presenting  symptoms include infection empyema" The worrisome physical exam findings include acute infection needing drainage " The initial radiographic and laboratory data are worrisome because of infection" The chronic co-morbidities include status post drainage  * I certify that at the point of admission it is my clinical judgment that the patient will require inpatient hospital care spanning beyond 2 midnights from the point of admission due to high intensity of service, high risk for further deterioration and high frequency of surveillance required.*    Family Communication: Patient  Disposition Plan: SNF Barrier to discharge-awaiting bed at skilled nursing facility  Consultants:  Infectious disease  Thoracic surgery  IR  Procedures:  I&D chest tube January 03, 2020  Antimicrobials: Status post:  -Initially was treated with IV Zosyn beginning on 12/3) and subsequently transitioned to Augmentin for a total of 6 weeks of treatment-completed antibiotics on 02/17/2020.  DVT prophylaxis: Lovenox   Objective: Vitals:   02/28/20 1410 02/28/20 2030 02/29/20 0453 02/29/20 1347  BP: 138/76 (!) 149/86 116/72 114/70  Pulse: 67 92 64 81  Resp: 16 16 16 18   Temp: 97.7 F (36.5 C) 98 F (36.7 C) (!) 97.3 F (36.3 C) 98.5 F (36.9 C)  TempSrc: Oral Oral Oral Oral  SpO2: 98% 100% 99% 99%  Weight:      Height:        Intake/Output Summary (Last 24 hours) at 02/29/2020 1528 Last data filed at 02/29/2020 1345 Gross per 24 hour  Intake 120 ml  Output 250 ml  Net -130 ml   Filed Weights   01/03/20 1414  Weight: 66.8 kg   Body mass index is 21.13 kg/m.  Exam:  . General: 80 y.o. year-old male well developed well nourished in no acute distress.  Alert and oriented x3. . Cardiovascular: Regular rate and rhythm with no rubs or gallops.  No thyromegaly or JVD noted.   [name] Respiratory: Clear to auscultation with no wheezes or rales. Good inspiratory effort. . Abdomen: Soft nontender  nondistended with normal bowel sounds x4 quadrants. . Musculoskeletal: No lower extremity edema. 2/4 pulses in all 4 extremities. . Skin: No ulcerative lesions noted or rashes, . Psychiatry: Mood is appropriate for condition and setting    Data Reviewed: CBC: No results for input(s): WBC, NEUTROABS, HGB, HCT, MCV, PLT in the last 168 hours. Basic Metabolic Panel: No results for input(s): NA, K, CL, CO2, GLUCOSE, BUN, CREATININE, CALCIUM, MG, PHOS in the last 168 hours. GFR: CrCl cannot be calculated (Patient's most recent lab result is older than the maximum 21 days allowed.). Liver Function Tests: No results for input(s): AST, ALT, ALKPHOS, BILITOT, PROT, ALBUMIN in the last 168 hours. No results for input(s): LIPASE, AMYLASE in the last 168 hours. No results for input(s): AMMONIA in the last 168 hours. Coagulation Profile: No results for input(s): INR, PROTIME in the last 168 hours. Cardiac Enzymes: No results for input(s): CKTOTAL, CKMB, CKMBINDEX, TROPONINI in the last 168 hours. BNP (last 3 results) No results for input(s): PROBNP in the last 8760 hours. HbA1C: No results for input(s): HGBA1C in the last 72 hours. CBG: No results for input(s): GLUCAP in the last 168 hours. Lipid Profile: No results for input(s): CHOL, HDL, LDLCALC, TRIG, CHOLHDL, LDLDIRECT in the last 72 hours. Thyroid Function Tests: No results for input(s): TSH, T4TOTAL, FREET4, T3FREE, THYROIDAB in the last 72 hours.  Anemia Panel: No results for input(s): VITAMINB12, FOLATE, FERRITIN, TIBC, IRON, RETICCTPCT in the last 72 hours. Urine analysis:    Component Value Date/Time   COLORURINE YELLOW (A) 12/29/2019 1127   APPEARANCEUR HAZY (A) 12/29/2019 1127   APPEARANCEUR Cloudy 01/02/2012 1326   LABSPEC 1.019 12/29/2019 1127   LABSPEC 1.017 01/02/2012 1326   PHURINE 5.0 12/29/2019 1127   GLUCOSEU NEGATIVE 12/29/2019 1127   GLUCOSEU NEGATIVE 09/26/2017 1104   HGBUR NEGATIVE 12/29/2019 1127    [place] 12/29/2019 1127   BILIRUBINUR Negative 01/02/2012 [place] 12/29/2019 1127   PROTEINUR NEGATIVE 12/29/2019 1127   UROBILINOGEN 0.2 09/26/2017 1104   NITRITE NEGATIVE 12/29/2019 1127   LEUKOCYTESUR MODERATE (A) 12/29/2019 1127   LEUKOCYTESUR 3+ 01/02/2012 1326   Sepsis Labs: @LABRCNTIP (procalcitonin:4,lacticidven:4)  ) No results found for this or any previous visit (from the past 240 hour(s)).    Studies: No results found.  Scheduled Meds: . aspirin EC  81 mg Oral Daily  . clopidogrel  75 mg Oral Daily  . DULoxetine  60 mg Oral Daily  . enoxaparin (LOVENOX) injection  40 mg Subcutaneous Daily  . feeding supplement  237 mL Oral TID BM  . multivitamin with minerals  1 tablet Oral Daily  . rosuvastatin  10 mg Oral Daily     LOS: 32 days     Cristal Deer, MD [facility]  To reach me or the doctor on call, go to: [email or web address] Password [facility]  02/29/2020, 3:28 PM

## 2020-03-01 NOTE — Progress Notes (Signed)
PROGRESS NOTE  [name] [name] [id number] DOB: [birth year] DOA: 01/02/2020 PCP: [name], [name], MD  HPI/Recap of past 81 hours:  80 year old male with history of recent CVA in 11/2019 with residual dysarthria, hypertension, hyperlipidemia and spinal stenosis, initially presented to [facility] from [facility] SNF with confusion and difficulty breathing. Work-up revealed right-sided loculated pleural effusion with concern for empyema. IR at [facility] attempted ultrasound-guided chest tube placement but were unable due to thickness of pleural fluid. He was evaluated by general surgery at [facility] who recommended transfer to [facility] for thoracic surgery evaluation. Patient was evaluated by thoracic surgery who recommended chest tube placement by IR, that was performed on 01/03/2020. ID consulted and recommended transitioning from IV Zosyn to IV ceftriaxone and oral Flagyl while in-house and discharge on p.o. Augmentin for a total of 6 weeks with start date of 01/03/2020. Right-sided chest tube removed 12/16. TCTS signed off 12/17 and advised no outpatient follow-up needed.  Patient stable for discharge.  PT/OT recommending SNF.  Patient will need long-term Medicaid in place before can discharge.  Subjective: March 01, 2020. Patient seen and examined at bedside denies any new complaint patient looks well.  Still waiting for nursing home placement.  We will continue with physical therapy February 29, 2020: Patient seen and examined at bedside he denies any complaints he is sitting in the recliner February 26, 2020 Patient seen and examined at bedside he denies any complaints he is aware that they are waiting for nursing home placement  February 25, 2020 Patient seen and examined at bedside denies any complaints February 23, 2020: Patient seen and examined at bedside Patient denies any new complain  February 22, 2020 Patient seen and examined at bedside he denies any  pain  Assessment/Plan: Active Problems:   Empyema lung ([place])   Severe sepsis without septic shock ([place])   History of CVA in adulthood   Physical deconditioning   Dementia associated with other underlying disease without behavioral disturbance ([place])   Pressure injury of skin  #1 empyema status post drainage right-sided chest tube was placed on January 03, 2020 and removed January 16, 2020.  2.  Acute hypoxemic respiratory failure secondary to right-sided parapneumonic effusion empyema resolved now after drainage  3.   deconditioning continue PT and OT. PT OT recommend SNF waiting for SNF  4.  Hypertension controlled continue current medicine  5.  Severe protein malnutrition.  Body max   index is estimated to be 21 continue nutritional supplements  6.  Sepsis ruled out patient initially was on IV Zosyn started December 3 and subsequently transitioned to Augmentin for total of 6 weeks of treatment which was completed February 17, 2020  7. Sacral ulcer present on admission  Pressure Injury 02/12/20 Sacrum Mid Stage 2 -  Partial thickness loss of dermis presenting as a shallow open injury with a red, pink wound bed without slough. (Active)  02/12/20 1930  Location: Sacrum  Location Orientation: Mid  Staging: Stage 2 -  Partial thickness loss of dermis presenting as a shallow open injury with a red, pink wound bed without slough.  Wound Description (Comments):   Present on Admission:     Code Status: DNR  Severity of Illness: The appropriate patient status for this patient is INPATIENT. Inpatient status is judged to be reasonable and necessary in order to provide the required intensity of service to ensure the patient's safety. The patient's presenting symptoms, physical exam findings, and initial radiographic and laboratory data in the  context of their chronic comorbidities is felt to place them at high risk for further clinical deterioration. Furthermore, it is not anticipated that  the patient will be medically stable for discharge from the hospital within 2 midnights of admission. The following factors support the patient status of inpatient.   " The patient's presenting symptoms include infection empyema" The worrisome physical exam findings include acute infection needing drainage " The initial radiographic and laboratory data are worrisome because of infection" The chronic co-morbidities include status post drainage  * I certify that at the point of admission it is my clinical judgment that the patient will require inpatient hospital care spanning beyond 2 midnights from the point of admission due to high intensity of service, high risk for further deterioration and high frequency of surveillance required.*    Family Communication: Patient  Disposition Plan: SNF Barrier to discharge-awaiting bed at skilled nursing facility  Consultants:  Infectious disease  Thoracic surgery  IR  Procedures:  I&D chest tube January 03, 2020  Antimicrobials: Status post:  -Initially was treated with IV Zosyn beginning on 12/3) and subsequently transitioned to Augmentin for a total of 6 weeks of treatment-completed antibiotics on 02/17/2020.  DVT prophylaxis: Lovenox   Objective: Vitals:   02/29/20 2050 03/01/20 0415 03/01/20 1100 03/01/20 1501  BP: 118/75 (!) 155/81 117/72 127/80  Pulse: 83 (!) 59 (!) 58 60  Resp: 18 17 16 14   Temp: (!) 97.4 F (36.3 C) 98.2 F (36.8 C) 98 F (36.7 C) 97.8 F (36.6 C)  TempSrc:   Oral Oral  SpO2: 99% 99%  98%  Weight:      Height:        Intake/Output Summary (Last 24 hours) at 03/01/2020 1505 Last data filed at 03/01/2020 0900 Gross per 24 hour  Intake -  Output 1000 ml  Net -1000 ml   Filed Weights   01/03/20 1414  Weight: 66.8 kg   Body mass index is 21.13 kg/m.  Exam:  . General: 80 y.o. year-old male well developed well nourished in no acute distress.  Alert and oriented x3. . Cardiovascular: Regular rate  and rhythm with no rubs or gallops.  No thyromegaly or JVD noted.   [name] Respiratory: Clear to auscultation with no wheezes or rales. Good inspiratory effort. . Abdomen: Soft nontender nondistended with normal bowel sounds x4 quadrants. . Musculoskeletal: No lower extremity edema. 2/4 pulses in all 4 extremities. . Skin: No ulcerative lesions noted or rashes, . Psychiatry: Mood is appropriate for condition and setting    Data Reviewed: CBC: No results for input(s): WBC, NEUTROABS, HGB, HCT, MCV, PLT in the last 168 hours. Basic Metabolic Panel: No results for input(s): NA, K, CL, CO2, GLUCOSE, BUN, CREATININE, CALCIUM, MG, PHOS in the last 168 hours. GFR: CrCl cannot be calculated (Patient's most recent lab result is older than the maximum 21 days allowed.). Liver Function Tests: No results for input(s): AST, ALT, ALKPHOS, BILITOT, PROT, ALBUMIN in the last 168 hours. No results for input(s): LIPASE, AMYLASE in the last 168 hours. No results for input(s): AMMONIA in the last 168 hours. Coagulation Profile: No results for input(s): INR, PROTIME in the last 168 hours. Cardiac Enzymes: No results for input(s): CKTOTAL, CKMB, CKMBINDEX, TROPONINI in the last 168 hours. BNP (last 3 results) No results for input(s): PROBNP in the last 8760 hours. HbA1C: No results for input(s): HGBA1C in the last 72 hours. CBG: No results for input(s): GLUCAP in the last 168 hours. Lipid Profile: No  results for input(s): CHOL, HDL, LDLCALC, TRIG, CHOLHDL, LDLDIRECT in the last 72 hours. Thyroid Function Tests: No results for input(s): TSH, T4TOTAL, FREET4, T3FREE, THYROIDAB in the last 72 hours. Anemia Panel: No results for input(s): VITAMINB12, FOLATE, FERRITIN, TIBC, IRON, RETICCTPCT in the last 72 hours. Urine analysis:    Component Value Date/Time   COLORURINE YELLOW (A) 12/29/2019 1127   APPEARANCEUR HAZY (A) 12/29/2019 1127   APPEARANCEUR Cloudy 01/02/2012 1326   LABSPEC 1.019 12/29/2019 1127    LABSPEC 1.017 01/02/2012 1326   PHURINE 5.0 12/29/2019 1127   GLUCOSEU NEGATIVE 12/29/2019 1127   GLUCOSEU NEGATIVE 09/26/2017 1104   HGBUR NEGATIVE 12/29/2019 1127   [place] 12/29/2019 1127   BILIRUBINUR Negative 01/02/2012 [place] 12/29/2019 1127   PROTEINUR NEGATIVE 12/29/2019 1127   UROBILINOGEN 0.2 09/26/2017 1104   NITRITE NEGATIVE 12/29/2019 1127   LEUKOCYTESUR MODERATE (A) 12/29/2019 1127   LEUKOCYTESUR 3+ 01/02/2012 1326   Sepsis Labs: @LABRCNTIP (procalcitonin:4,lacticidven:4)  ) No results found for this or any previous visit (from the past 240 hour(s)).    Studies: No results found.  Scheduled Meds: . aspirin EC  81 mg Oral Daily  . clopidogrel  75 mg Oral Daily  . DULoxetine  60 mg Oral Daily  . enoxaparin (LOVENOX) injection  40 mg Subcutaneous Daily  . feeding supplement  237 mL Oral TID BM  . multivitamin with minerals  1 tablet Oral Daily  . rosuvastatin  10 mg Oral Daily     LOS: 38 days     Cristal Deer, MD [facility]  To reach me or the doctor on call, go to: [email or web address] Password [email or web address]  03/01/2020, 3:05 PM

## 2020-03-02 ENCOUNTER — Inpatient Hospital Stay (HOSPITAL_COMMUNITY): Payer: Medicare HMO

## 2020-03-02 MED ORDER — ADULT MULTIVITAMIN W/MINERALS CH: 1.0000 | ORAL_TABLET | Freq: Every day | ORAL | Status: DC

## 2020-03-02 NOTE — Progress Notes (Signed)
Physical Therapy Treatment Patient Details Name: [name] [name] MRN: [id number] DOB: [birth year] Today's Date: 03/02/2020    History of Present Illness 80 year old male with history of recent CVA in 11/2019 with residual dysarthria, hypertension, hyperlipidemia and spinal stenosis, initially presented to [facility] from [facility] SNF with confusion and difficulty breathing.  Work-up revealed right-sided loculated pleural effusion with concern for empyema. Transfer to [facility]. Patient was evaluated by thoracic surgery who recommended chest tube placement by IR, that was performed on 01/03/2020.  ID consulted and recommended transitioning from IV Zosyn to IV ceftriaxone and oral Flagyl while in-house and discharge on p.o. Augmentin for a total of 6 weeks with start date of 01/03/2020. Right-sided chest tube removed 12/16.    PT Comments    Pt pleasant and agreeable to OOB mobility upon PT arrival. Pt ambulatory to and from bathroom today, limited by stool and urinary incontinence requiring PT assist to clean up. Pt requiring min assist for all mobility this day, and requires step-by-step cuing with increased processing time allotted. Of note, pt with poor saliva management today, with multiple episodes of "drooling". Pt states "no" he cannot manage/swalllow his secretions today and pt's sister at bedside states this is new. Pt's RN and internal medicine NP notified. PT to continue to follow acutely .    Follow Up Recommendations  SNF     Equipment Recommendations  Rolling walker with 5" wheels;3in1 (PT)    Recommendations for Other Services       Precautions / Restrictions Precautions Precautions: Fall Precaution Comments: incontinent of urine and stool Restrictions Weight Bearing Restrictions: No    Mobility  Bed Mobility Overal bed mobility: Needs Assistance Bed Mobility: Supine to Sit     Supine to sit: Min assist     General bed mobility comments: min assist for trunk  elevation, pt utilizing HHA and bedrail to pull to sitting position. Increased time/effort.  Transfers Overall transfer level: Needs assistance Equipment used: Rolling walker (2 wheeled) Transfers: Sit to/from Stand Sit to Stand: Min assist         General transfer comment: Min assist for power up, rise, and steady. STS x2, from EOB and toilet.  Ambulation/Gait Ambulation/Gait assistance: Min assist Gait Distance (Feet): 10 Feet (2x10 ft, to and from toilet) Assistive device: Rolling walker (2 wheeled) Gait Pattern/deviations: Shuffle;Decreased stride length;Trunk flexed;Step-through pattern Gait velocity: very decr   General Gait Details: Min assist to steady, maneuver RW, and guide pt to destination surface (toilet, recliner). Very increased time to perform   Stairs             Wheelchair Mobility    Modified Rankin (Stroke Patients Only)       Balance Overall balance assessment: Needs assistance Sitting-balance support: Feet supported Sitting balance-Leahy Scale: Fair     Standing balance support: Bilateral upper extremity supported;During functional activity Standing balance-Leahy Scale: Poor Standing balance comment: reliant on external assist                            Cognition Arousal/Alertness: Awake/alert Behavior During Therapy: Flat affect Overall Cognitive Status: History of cognitive impairments - at baseline Area of Impairment: Attention;Awareness;Problem solving;Following commands;Safety/judgement                 Orientation Level:  (NT) Current Attention Level: Sustained   Following Commands: Follows one step commands with increased time Safety/Judgement: Decreased awareness of safety;Decreased awareness of deficits Awareness: Intellectual Problem Solving: Slow processing;Requires  verbal cues;Requires tactile cues;Difficulty sequencing;Decreased initiation General Comments: very increased time to follow command and  respond to questions, incontinent of both stool and urine during session      Exercises General Exercises - Lower Extremity Long Arc Quad: AROM;Both;10 reps;Seated    General Comments General comments (skin integrity, edema, etc.): PT assisted with pericare and cleanup, as pt having BM and urinating while walking to the bathroom      Pertinent Vitals/Pain Pain Assessment: Faces Faces Pain Scale: Hurts little more Pain Location: chest wall (from previous chest tube per pt report) Pain Descriptors / Indicators: Sore Pain Intervention(s): Limited activity within patient's tolerance;Monitored during session;Repositioned    Home Living                      Prior Function            PT Goals (current goals can now be found in the care plan section) Acute Rehab PT Goals Patient Stated Goal: get stronger PT Goal Formulation: With patient Time For Goal Achievement: 03/16/20 Potential to Achieve Goals: Fair Progress towards PT goals: Progressing toward goals    Frequency    Min 2X/week      PT Plan Current plan remains appropriate    Co-evaluation              AM-PAC PT "6 Clicks" Mobility   Outcome Measure  Help needed turning from your back to your side while in a flat bed without using bedrails?: A Little Help needed moving from lying on your back to sitting on the side of a flat bed without using bedrails?: A Little Help needed moving to and from a bed to a chair (including a wheelchair)?: A Little Help needed standing up from a chair using your arms (e.g., wheelchair or bedside chair)?: A Little Help needed to walk in hospital room?: A Little Help needed climbing 3-5 steps with a railing? : A Lot 6 Click Score: 17    End of Session Equipment Utilized During Treatment: Gait belt Activity Tolerance: Patient tolerated treatment well Patient left: in chair;with call bell/phone within reach;with chair alarm set;with family/visitor present (pt lunch set  up in front for him) Nurse Communication: Mobility status PT Visit Diagnosis: Difficulty in walking, not elsewhere classified (R26.2);Muscle weakness (generalized) (M62.81)     Time: 8527-7824 PT Time Calculation (min) (ACUTE ONLY): 29 min  Charges:  $Gait Training: 8-22 mins $Therapeutic Activity: 8-22 mins                    Stacie Glaze, PT Acute Rehabilitation Services Pager [phone]  Office [phone]  Gillett E Ruffin Pyo 03/02/2020, 1:01 PM

## 2020-03-02 NOTE — Progress Notes (Signed)
CSW received call from patient's sister [name] regarding his return to [facility], tentatively scheduled for Wednesday February 2nd. [name] had several questions regarding the financial requirement for admission back at [facility] provided her with Jackelyn Poling at [facility] number.   Madilyn Fireman, MSW, LCSW-A Transitions of Care  Clinical Social Worker I [phone]

## 2020-03-02 NOTE — Progress Notes (Addendum)
[facility] PROGRESS NOTE  [name] [name] WNU:[id number] DOB: 09/03/1940 DOA: 01/02/2020 PCP: [name], [name], MD  Status: Remains inpatient appropriate because:Unsafe d/c plan   Dispo:  Patient From:  Private residence  Planned Disposition:  SNF  Expected discharge date: March 04, 2020  Medically stable for discharge:   Barriers to discharge: Needs SNF placement but Medicaid application in process and as of 1/12 reportedly has been approved.  Plan is to discharge to [place] on Wednesday, February 2   Code Status: DNR Family Communication: Son [name] 1/07-TOC has made repeated calls to patient's son on 1/12, 1/13 and 1/14 without any response; TOC spoke with patient's sister on 1/13 DVT prophylaxis: Lovenox Vaccination status: 1/10 patient given first dose of Pfizer COVID-vaccine  Foley catheter:  No   HPI: 80 year old male with history of recent CVA in 11/2019 with residual dysarthria, hypertension, hyperlipidemia and spinal stenosis, initially presented to [facility] from [facility] SNF with confusion and difficulty breathing. Work-up revealed right-sided loculated pleural effusion with concern for empyema. IR at [facility] attempted ultrasound-guided chest tube placement but were unable due to thickness of pleural fluid. He was evaluated by general surgery at [facility] who recommended transfer to [facility] for thoracic surgery evaluation. Patient was evaluated by thoracic surgery who recommended chest tube placement by IR, that was performed on 01/03/2020. ID consulted and recommended transitioning from IV Zosyn to IV ceftriaxone and oral Flagyl while in-house and discharge on p.o. Augmentin for a total of 6 weeks with start date of 01/03/2020. Right-sided chest tube removed 12/16. TCTS signed off 12/17 and advised no outpatient follow-up needed.  Patient stable for discharge.  PT/OT recommending SNF.  Patient will need long-term Medicaid in place  before can discharge.  Subjective: Awake and sitting up in bed eating breakfast.  Very excited and happy that he will be returning to [facility] on Wednesday.  Objective: Vitals:   03/01/20 1928 03/02/20 0400  BP: 125/79 126/81  Pulse: 61 61  Resp: 15 16  Temp: 98 F (36.7 C) 98.1 F (36.7 C)  SpO2: 99% 99%    Intake/Output Summary (Last 24 hours) at 03/02/2020 0820 Last data filed at 03/01/2020 1700 Gross per 24 hour  Intake 357 ml  Output 950 ml  Net -593 ml   Filed Weights   01/03/20 1414  Weight: 66.8 kg    Exam: Constitutional: Awake, calm, no acute distress Respiratory: Bilateral lung sounds clear, stable on room air Cardiovascular: Normal heart sounds, no resting tachycardia, extremities warm to touch with adequate capillary refill Abdomen: Feed self independently, normoactive bowel sounds, LBM 1/30 Neurologic: CN 2-12 grossly intact. Sensation intact,  Strength 5/5 x on left slightly weaker 4/5 on the right Psychiatric: Awake with flat affect.  Oriented to name only   Assessment/Plan: Acute problems: Acute hypoxemic respiratory failure  2/2 right-sided parapneumonic effusion, empyema  **SEPSIS RULED OUT** -Hypoxemia (resolved)-TCTS consulted -s/p pigtail chest tube on 01/03/2020 and was removed on 12/16. -Follow-up chest x-ray 12/17 without pneumothorax.   -Thoracic surgery signed off 12/17, prn OP follow-up recommended -Initially was treated with IV Zosyn beginning on 12/3) and subsequently transitioned to Augmentin for a total of 6 weeks of treatment-completed antibiotics on 02/17/2020.   ? New drooling -h/o CVA -family reorts new drooling-ck CT Head to ensure no new CVA-exam normal this am- if CT neg may need MRI -D/W his nurse-pt had transient drooling related to substance in mouth. Pt at known baseline-given history plan is  to pursue CT as above **1517: CT head unremarkable  Physical deconditioning/gait disturbance -PT and OT recommending SNF. -PT  documented slow shuffling steps while mobilizing -OT documenting patient requires minimal assistance to change gown, perform seated grooming and eating.  He is able to transfer to chair with light moderate assist to stand from elevated surface and minimal assist to transfer. -As of 1/24 PT documents improved progress in gait distance up to 40 feet using rolling walker-continue to recommend SNF  Hypertension -Controlled off antihypertensive medication  Severe protein calorie malnutrition Nutrition Problem: Severe Malnutrition Etiology: chronic illness (CVA) Signs/Symptoms: moderate fat depletion,severe fat depletion,moderate muscle depletion,severe muscle depletion Interventions: Ensure Enlive (each supplement provides 350kcal and 20 grams of protein),Magic cup,MVI -As of 12/23 patient able to feed self and eating meals well (between 50% and 100% of meals over the past 24 hours) Estimated body mass index is 21.13 kg/m as calculated from the following:   Height as of this encounter: 5\' 10"  (1.778 m).   Weight as of this encounter: 66.8 kg.   Wounds: Incision (Closed) 01/03/20 (Active)  Date First Assessed/Time First Assessed: 01/03/20 2000      Assessments 01/03/2020  8:00 PM 02/29/2020  9:30 AM  Dressing Dry;Clean;Intact Clean;Dry;Intact  Site / Wound Assessment Dressing in place / Unable to assess Clean;Dry  Drainage Description Serosanguineous --     No Linked orders to display     Pressure Injury 02/12/20 Sacrum Mid Stage 2 -  Partial thickness loss of dermis presenting as a shallow open injury with a red, pink wound bed without slough. (Active)  Date First Assessed/Time First Assessed: 02/12/20 1930   Location: Sacrum  Location Orientation: Mid  Staging: Stage 2 -  Partial thickness loss of dermis presenting as a shallow open injury with a red, pink wound bed without slough.    Assessments 02/12/2020  9:23 PM 02/29/2020  8:25 PM  Dressing Type Foam - Lift dressing to assess site every  shift Foam - Lift dressing to assess site every shift  Dressing Changed Intact;Dry;Clean  Dressing Change Frequency -- PRN  Site / Wound Assessment Clean;Dry Clean;Dry  Peri-wound Assessment -- Intact  Drainage Amount None None     No Linked orders to display     Other problems: Acute kidney injury -Resolved -Current creatinine 1.01 with a GFR greater than 60  History of CVA/dementia without behavioral disturbances -Remains normotensive off of BP medication -Continue Plavix and low-dose aspirin -Continue statin/Crestor -Continue Cymbalta  Anemia of chronic disease -Hemoglobin stable around 10   Data Reviewed: Basic Metabolic Panel: No results for input(s): NA, K, CL, CO2, GLUCOSE, BUN, CREATININE, CALCIUM, MG, PHOS in the last 168 hours. Liver Function Tests: No results for input(s): AST, ALT, ALKPHOS, BILITOT, PROT, ALBUMIN in the last 168 hours. No results for input(s): LIPASE, AMYLASE in the last 168 hours. No results for input(s): AMMONIA in the last 168 hours. CBC: No results for input(s): WBC, NEUTROABS, HGB, HCT, MCV, PLT in the last 168 hours. Cardiac Enzymes: No results for input(s): CKTOTAL, CKMB, CKMBINDEX, TROPONINI in the last 168 hours. BNP (last 3 results) Recent Labs    12/26/19 1510 01/01/20 1018  BNP 88.7 177.1*    ProBNP (last 3 results) No results for input(s): PROBNP in the last 8760 hours.  CBG: No results for input(s): GLUCAP in the last 168 hours.  No results found for this or any previous visit (from the past 240 hour(s)).   Studies: No results found.  Scheduled Meds: .  aspirin EC  81 mg Oral Daily  . clopidogrel  75 mg Oral Daily  . DULoxetine  60 mg Oral Daily  . enoxaparin (LOVENOX) injection  40 mg Subcutaneous Daily  . feeding supplement  237 mL Oral TID BM  . multivitamin with minerals  1 tablet Oral Daily  . rosuvastatin  10 mg Oral Daily   Continuous Infusions:   Active Problems:   Empyema lung ([place])   Severe  sepsis without septic shock ([place])   History of CVA in adulthood   Physical deconditioning   Dementia associated with other underlying disease without behavioral disturbance ([place])   Pressure injury of skin   Consultants:  TCTS  Conventional radiology  Procedures:  Chest tube placement 12/3 by IR  Antibiotics: Anti-infectives (From admission, onward)   Start     Dose/Rate Route Frequency Ordered Stop   02/13/20 0000  amoxicillin-clavulanate (AUGMENTIN) 875-125 MG tablet        1 tablet Oral Every 12 hours 02/13/20 1316 02/14/20 2359   02/05/20 1430  amoxicillin-clavulanate (AUGMENTIN) 875-125 MG per tablet 1 tablet        1 tablet Oral Every 12 hours 02/05/20 1338 02/16/20 2105   01/12/20 1400  metroNIDAZOLE (FLAGYL) tablet 500 mg  Status:  Discontinued        500 mg Oral Every 8 hours 01/12/20 1111 02/05/20 1337   01/12/20 1300  cefTRIAXone (ROCEPHIN) 2 g in sodium chloride 0.9 % 100 mL IVPB  Status:  Discontinued        2 g 200 mL/hr over 30 Minutes Intravenous Every 24 hours 01/12/20 1111 02/05/20 1337   01/02/20 2330  piperacillin-tazobactam (ZOSYN) IVPB 3.375 g  Status:  Discontinued        3.375 g 12.5 mL/hr over 240 Minutes Intravenous Every 8 hours 01/02/20 2242 01/12/20 1111       Time spent: 20 minutes    Erin Hearing ANP  [facility] 7 am- 330 pm/M-F for direct patient care and secure chat Please refer to Amion for contact information 60 days

## 2020-03-03 DIAGNOSIS — I69313 Psychomotor deficit following cerebral infarction: Secondary | ICD-10-CM

## 2020-03-03 LAB — SARS CORONAVIRUS 2 (TAT 6-24 HRS): SARS Coronavirus 2: NEGATIVE

## 2020-03-03 NOTE — Progress Notes (Signed)
PROGRESS NOTE  Brief Narrative: [name] [name] is a 80 y.o. male with a history of CVA Oct 2021 with residual deficits including dysarthria, HTN, HLD, spinal stenosis who presented to [facility] from [facility] 01/02/2020 due to dyspnea, found to have right-sided empyema. IR was unable to place chest tube, so pt transferred to [facility] for CT surgery consult and ultimately have chest tube placed by IR 01/02/2020. ID was consulted, guided 6 weeks of antibiotic therapy which the patient has completed with eventual removal of chest tube on 12/16. Discharge to facility has been complicated by the patient's lack of long term medicaid, though he is stable for discharge. It appears this medicaid has been approved and plan is for discharge 03/04/2020.   Subjective: Pt without complaints. Specifically denies fever, cough, dyspnea, chest pain. Reports his drooling yesterday was because he was holding liquid in his mouth?? Hasn't happened since.  Objective: BP 128/68 (BP Location: Left Arm)   Pulse 62   Temp 98 F (36.7 C) (Oral)   Resp 16   Ht 5\' 10"  (1.778 m)   Wt 66.8 kg   SpO2 99%   BMI 21.13 kg/m   Gen: Elderly, chronically ill-appearing male in no distress Pulm: Clear and nonlabored on room air  CV: RRR, no murmur, no JVD, no edema GI: Soft, NT, ND, +BS  Neuro: Alert and oriented. +dysarthria without other significant focal deficits. Skin: No rashes, lesions or ulcers on visualized skin.  Assessment & Plan: This 80yo gentleman with recent stroke, discharged to SNF from which he was admitted for empyema and has completed successful treatment with chest tube (now removed) and 6 weeks of antibiotics is stable for discharge. Debility requires SNF for safe disposition which has required medicaid approval. The plan is to discharge to SNF tomorrow.   In regards to drooling episode 1/31, this doesn't seem to be due to persistent neurological deficit and CT head showed no acute changes. We will continue  routine neurological monitoring.  Time spent: 15 minutes.  Patrecia Pour, MD Pager on amion 03/03/2020, 11:28 AM

## 2020-03-04 NOTE — Progress Notes (Signed)
Occupational Therapy Treatment Patient Details Name: [name] [name] MRN: [id number] DOB: Dec 26, 1940 Today's Date: 03/04/2020    History of present illness 80 year old male with history of recent CVA in 11/2019 with residual dysarthria, hypertension, hyperlipidemia and spinal stenosis, initially presented to [facility] from [facility] SNF with confusion and difficulty breathing.  Work-up revealed right-sided loculated pleural effusion with concern for empyema. Transfer to [facility]. Patient was evaluated by thoracic surgery who recommended chest tube placement by IR, that was performed on 01/03/2020.  ID consulted and recommended transitioning from IV Zosyn to IV ceftriaxone and oral Flagyl while in-house and discharge on p.o. Augmentin for a total of 6 weeks with start date of 01/03/2020. Right-sided chest tube removed 12/16.   OT comments  Pt assisted OOB, to walk around bed and to sit up in chair where he performed grooming with set up and remained up in chair drinking an Ensure at end of session, chair alarm set. Pt is eager to go to SNF today.   Follow Up Recommendations  SNF;Supervision/Assistance - 24 hour    Equipment Recommendations       Recommendations for Other Services      Precautions / Restrictions Precautions Precautions: Fall Precaution Comments: incontinent of urine and stool       Mobility Bed Mobility Overal bed mobility: Needs Assistance Bed Mobility: Supine to Sit     Supine to sit: Min assist     General bed mobility comments: pulled up on therapist's hand  Transfers Overall transfer level: Needs assistance Equipment used: Rolling walker (2 wheeled) Transfers: Sit to/from Stand Sit to Stand: Min guard         General transfer comment: min assist to rise and steady from bed    Balance Overall balance assessment: Needs assistance Sitting-balance support: Feet supported Sitting balance-Leahy Scale: Fair       Standing balance-Leahy Scale:  Poor Standing balance comment: reliant on RW                           ADL either performed or assessed with clinical judgement   ADL Overall ADL's : Needs assistance/impaired Eating/Feeding: Minimal assistance;Sitting Eating/Feeding Details (indicate cue type and reason): opened Ensure bottle Grooming: Oral care;Sitting;Set up;Wash/dry hands;Wash/dry face Grooming Details (indicate cue type and reason): in chair                             Functional mobility during ADLs: Min guard;Rolling walker (ambulated around bed to chair)       Vision       Perception     Praxis      Cognition Arousal/Alertness: Awake/alert Behavior During Therapy: Flat affect Overall Cognitive Status: History of cognitive impairments - at baseline                                 General Comments: pt voicing confusion about finances related to going to SNF        Exercises     Shoulder Instructions       General Comments      Pertinent Vitals/ Pain       Pain Assessment: No/denies pain  Home Living  Prior Functioning/Environment              Frequency  Min 1X/week        Progress Toward Goals  OT Goals(current goals can now be found in the care plan section)  Progress towards OT goals: Progressing toward goals  Acute Rehab OT Goals Patient Stated Goal: get stronger OT Goal Formulation: With patient Time For Goal Achievement: 03/11/20 Potential to Achieve Goals: [place] Discharge plan remains appropriate;Frequency needs to be updated    Co-evaluation                 AM-PAC OT "6 Clicks" Daily Activity     Outcome Measure   Help from another person eating meals?: A Little Help from another person taking care of personal grooming?: A Little Help from another person toileting, which includes using toliet, bedpan, or urinal?: A Little Help from another person  bathing (including washing, rinsing, drying)?: A Little Help from another person to put on and taking off regular upper body clothing?: A Little Help from another person to put on and taking off regular lower body clothing?: A Little 6 Click Score: 18    End of Session Equipment Utilized During Treatment: Gait belt;Rolling walker  OT Visit Diagnosis: Unsteadiness on feet (R26.81);Other abnormalities of gait and mobility (R26.89);Muscle weakness (generalized) (M62.81);Other symptoms and signs involving cognitive function   Activity Tolerance Patient tolerated treatment well   Patient Left in chair;with call bell/phone within reach;with chair alarm set   Nurse Communication          Time: [phone] OT Time Calculation (min): 29 min  Charges: OT General Charges $OT Visit: 1 Visit OT Treatments $Self Care/Home Management : 23-37 mins  Nestor Lewandowsky, OTR/L Acute Rehabilitation Services Pager: [phone] Office: [phone]   Malka So 03/04/2020, 10:21 AM

## 2020-03-04 NOTE — Progress Notes (Addendum)
12:45pm: CSW spoke with patient's sister [name] and Lorin Glass [facility] Administrator) regarding the current barrier for this patient's discharge to the facility. Per Jenny Reichmann, patient verbalized that he is unwilling to sale his home which would qualify him for Medicaid - which would require the patient to pay privately for placement (~$15,748 monthly).   CSW will notify  APS of information.  8am: CSW sent patient's FL2 to Roselyn Reef, the assigned Medicaid worker at [place] to support the program change.  Madilyn Fireman, MSW, LCSW-A Transitions of Care  Clinical Social Worker I [phone]

## 2020-03-04 NOTE — Plan of Care (Signed)
  Problem: Education: Goal: Knowledge of General Education information will improve Description: Including pain rating scale, medication(s)/side effects and non-pharmacologic comfort measures Outcome: Progressing   Problem: Pain Managment: Goal: General experience of comfort will improve Outcome: Not Applicable   Problem: Clinical Measurements: Goal: Ability to maintain clinical measurements within normal limits will improve Outcome: Progressing   Problem: Activity: Goal: Risk for activity intolerance will decrease Outcome: Progressing

## 2020-03-04 NOTE — Progress Notes (Signed)
Physical Therapy Treatment Patient Details Name: [name] [name] MRN: [id number] DOB: [birth year] Today's Date: 03/04/2020    History of Present Illness 80 year old male with history of recent CVA in 11/2019 with residual dysarthria, hypertension, hyperlipidemia and spinal stenosis, initially presented to [facility] from [facility] SNF with confusion and difficulty breathing.  Work-up revealed right-sided loculated pleural effusion with concern for empyema. Transfer to [facility]. Patient was evaluated by thoracic surgery who recommended chest tube placement by IR, that was performed on 01/03/2020.  ID consulted and recommended transitioning from IV Zosyn to IV ceftriaxone and oral Flagyl while in-house and discharge on p.o. Augmentin for a total of 6 weeks with start date of 01/03/2020. Right-sided chest tube removed 12/16.    PT Comments    Patient progressing slowly towards PT goals. Upon arrival, reporting he is supposed to be leaving today but stating he has "a lot on his mind." Requires Min A to power to standing from all surfaces with cues for hand placement/technique. Ambulation distance limited due to needing to go to the bathroom. Pt requiring Min A and use of RW for support with slow gait speed. Will continue to follow and progress as tolerated.   Follow Up Recommendations  SNF     Equipment Recommendations  Rolling walker with 5" wheels;3in1 (PT)    Recommendations for Other Services       Precautions / Restrictions Precautions Precautions: Fall Precaution Comments: incontinent of urine and stool Restrictions Weight Bearing Restrictions: No    Mobility  Bed Mobility Overal bed mobility: Needs Assistance Bed Mobility: Supine to Sit     Supine to sit: Min assist     General bed mobility comments: Up in chair upon PT arrival.  Transfers Overall transfer level: Needs assistance Equipment used: Rolling walker (2 wheeled) Transfers: Sit to/from Stand Sit to Stand: Min  assist         General transfer comment: ASsist to power to standing with cues for hand placement/technique. Stood from chair x1, from [facility] in bathroom x1.  Ambulation/Gait Ambulation/Gait assistance: Min assist Gait Distance (Feet): 15 Feet (x2 bouts) Assistive device: Rolling walker (2 wheeled) Gait Pattern/deviations: Shuffle;Decreased stride length;Trunk flexed;Step-through pattern Gait velocity: decreased Gait velocity interpretation: <1.31 ft/sec, indicative of household ambulator General Gait Details: Slow, mildly unsteady gait with RW for support; Min A for balance/safety. Limited due to need to use the bathroom.   Stairs             Wheelchair Mobility    Modified Rankin (Stroke Patients Only)       Balance Overall balance assessment: Needs assistance Sitting-balance support: Feet supported;No upper extremity supported Sitting balance-Leahy Scale: Fair     Standing balance support: During functional activity Standing balance-Leahy Scale: Poor Standing balance comment: reliant on RW and min A, total A for pericare                            Cognition Arousal/Alertness: Awake/alert Behavior During Therapy: Flat affect Overall Cognitive Status: History of cognitive impairments - at baseline                                 General Comments: pt voicing confusion about finances related to going to SNF, "I have a lot on my mind."      Exercises      General Comments        Pertinent  Vitals/Pain Pain Assessment: No/denies pain    Home Living                      Prior Function            PT Goals (current goals can now be found in the care plan section) Acute Rehab PT Goals Patient Stated Goal: get stronger Progress towards PT goals: Progressing toward goals    Frequency    Min 2X/week      PT Plan Current plan remains appropriate    Co-evaluation              AM-PAC PT "6 Clicks" Mobility    Outcome Measure  Help needed turning from your back to your side while in a flat bed without using bedrails?: A Little Help needed moving from lying on your back to sitting on the side of a flat bed without using bedrails?: A Little Help needed moving to and from a bed to a chair (including a wheelchair)?: A Little Help needed standing up from a chair using your arms (e.g., wheelchair or bedside chair)?: A Little Help needed to walk in hospital room?: A Little Help needed climbing 3-5 steps with a railing? : A Lot 6 Click Score: 17    End of Session Equipment Utilized During Treatment: Gait belt Activity Tolerance: Patient tolerated treatment well Patient left: in chair;with call bell/phone within reach;with nursing/sitter in room;with chair alarm set Nurse Communication: Mobility status;Other (comment) (large BM) PT Visit Diagnosis: Difficulty in walking, not elsewhere classified (R26.2);Muscle weakness (generalized) (M62.81)     Time: [id number] PT Time Calculation (min) (ACUTE ONLY): 24 min  Charges:  $Therapeutic Activity: 23-37 mins                     Marisa Severin, PT, DPT Acute Rehabilitation Services Pager [phone] Office [place] A Sabra Heck 03/04/2020, 12:20 PM

## 2020-03-04 NOTE — TOC Progression Note (Signed)
Transition of Care [facility]) - Progression Note    Patient Details  Name: [name] [name] MRN: [id number] Date of Birth: May 15, 1940  Transition of Care [facility]) CM/SW Contact  Curlene Labrum, RN Phone Number: 03/04/2020, 1:02 PM  Clinical Narrative:    Case management met with the patient at the bedside and patient asked to have me call the patient's sister, [name] [name], on the phone to speak with her regarding his need to go to the rehabilitation facility with [facility] today.  The patient spoke with the patient's sister, [name] [name], on the phone and asked her "if you are helping me sign in to the facility at 1030 am, what are you doing still in the bed at home'?  The patient was alert and answers questions appropriately and understand that [facility] was willing to offer him a bed at the facility today.  The sister, [name] [name], called back and spoke with [name] [name], CSW on the phone regarding her inability to sign the patient in to the facility since the patient refused to sell his house, liquidate his assets to be able to afford to stay at [facility] until he was able to qualify for long term medicaid.  See note from [name] [name], [place] regarding her communication with [facility] and the sister, [name] [name], on the phone regarding SNF placement.  Patient will not be able to transfer to [facility] at this time since family was unwilling to sign the patient in to the facility.  APS was notified in [facility] by [name] [name], [place].  CM and MSW will continue to follow the patient for transitions of care - most likely transfer to home with the step son with home health support once communication is reached with APS regarding the matter. Expected Discharge Plan: Long Term Nursing Home Barriers to Discharge:  (Waiting on approval for [place] Medicaid)  Expected Discharge Plan and Services Expected Discharge Plan: [place]   Discharge Planning Services: CM Consult Post Acute Care Choice:  [place] Living arrangements for the past 2 months: [place]                                       Social Determinants of Health (SDOH) Interventions    Readmission Risk Interventions No flowsheet data found.

## 2020-03-04 NOTE — Progress Notes (Signed)
[facility] PROGRESS NOTE  [name] [name] NUU:[id number] DOB: [birth year] DOA: 01/02/2020 PCP: [name], [name], MD  Status: Remains inpatient appropriate because:Unsafe d/c plan   Dispo:  Patient From:  Private residence  Planned Disposition:  SNF  Expected discharge date: March 04, 2020  Medically stable for discharge:   Barriers to discharge: Inability to obtain appropriate funding for SNF.  Bed available.  Out-of-pocket expenses $16,000 per month.  Patient owns home and is in eligible for long-term Medicaid.  Since stepson lives in home and patient is unwilling to sell this home.  TOC attempting to contact stepson to confirm he will be able to pick patient up from the hospital and take him back home with home health services.  Code Status: DNR Family Communication: Son [name] 1/07-TOC has made repeated calls to patient's son on 1/12, 1/13 and 1/14 without any response; TOC spoke with patient's sister on 1/13-C attempted to contact son again on 2/2 with no success.  Voicemail and text sent. DVT prophylaxis: Lovenox Vaccination status: 1/10 patient given first dose of Pfizer COVID-vaccine  Foley catheter:  No   HPI: 80 year old male with history of recent CVA in 11/2019 with residual dysarthria, hypertension, hyperlipidemia and spinal stenosis, initially presented to [facility] from [facility] SNF with confusion and difficulty breathing. Work-up revealed right-sided loculated pleural effusion with concern for empyema. IR at [facility] attempted ultrasound-guided chest tube placement but were unable due to thickness of pleural fluid. He was evaluated by general surgery at [facility] who recommended transfer to [facility] for thoracic surgery evaluation. Patient was evaluated by thoracic surgery who recommended chest tube placement by IR, that was performed on 01/03/2020. ID consulted and recommended transitioning from IV Zosyn to IV ceftriaxone and oral Flagyl while in-house and  discharge on p.o. Augmentin for a total of 6 weeks with start date of 01/03/2020. Right-sided chest tube removed 12/16. TCTS signed off 12/17 and advised no outpatient follow-up needed.  Patient stable for discharge.  PT/OT recommending SNF.  Patient will need long-term Medicaid in place before can discharge.  Subjective: Sitting in bed trying to call his sister.  States he is eager to discharge to skilled nursing facility.  No complaints verbalized.  Subsequently since original encounter we have learned that unless patient pays out-of-pocket for SNF services he will be unable to come to nursing home.  Objective: Vitals:   03/03/20 2224 03/04/20 0452  BP: 120/67 118/68  Pulse: 79 82  Resp: 20 19  Temp: 98.4 F (36.9 C) 98.2 F (36.8 C)  SpO2: 98% 99%    Intake/Output Summary (Last 24 hours) at 03/04/2020 1407 Last data filed at 03/04/2020 1024 Gross per 24 hour  Intake 897 ml  Output 750 ml  Net 147 ml   Filed Weights   01/03/20 1414  Weight: 66.8 kg    Exam: Constitutional: Awake, calm, no acute distress Respiratory: Stable on room air, lung sounds clear Cardiovascular: S1-S2, regular pulse, extremities warm with adequate capillary refill Abdomen: Feed self independently, normoactive bowel sounds, tolerating diet.  LBM 1/31 Neurologic: CN 2-12 grossly intact. Sensation intact,  Strength 5/5 x on left slightly weaker 4/5 on the right Psychiatric: Awake with flat affect.  Oriented to name and place, not to year.  Appears oriented to situation at times.   Assessment/Plan: Acute problems: Acute hypoxemic respiratory failure  2/2 right-sided parapneumonic effusion, empyema  **SEPSIS RULED OUT** -Hypoxemia (resolved)-TCTS consulted -s/p pigtail chest tube on 01/03/2020 and was removed on 12/16. -Follow-up chest x-ray  12/17 without pneumothorax.   -Thoracic surgery signed off 12/17, prn OP follow-up recommended -Initially was treated with IV Zosyn beginning on 12/3) and subsequently  transitioned to Augmentin for a total of 6 weeks of treatment-completed antibiotics on 02/17/2020.   Physical deconditioning/gait disturbance -PT and OT recommending SNF patient unable to obtain funding to transition to SNF.  It is likely will need to pursue discharge to home with son with home health PT/OT/RN/social worker  Hypertension -Controlled off antihypertensive medication  Severe protein calorie malnutrition Nutrition Problem: Severe Malnutrition Etiology: chronic illness (CVA) Signs/Symptoms: moderate fat depletion,severe fat depletion,moderate muscle depletion,severe muscle depletion Interventions: Ensure Enlive (each supplement provides 350kcal and 20 grams of protein),Magic cup,MVI -As of 12/23 patient able to feed self and eating meals well (between 50% and 100% of meals over the past 24 hours) Estimated body mass index is 21.13 kg/m as calculated from the following:   Height as of this encounter: 5\' 10"  (1.778 m).   Weight as of this encounter: 66.8 kg.   Wounds: Incision (Closed) 01/03/20 (Active)  Date First Assessed/Time First Assessed: 01/03/20 2000      Assessments 01/03/2020  8:00 PM 02/29/2020  9:30 AM  Dressing Dry;Clean;Intact Clean;Dry;Intact  Site / Wound Assessment Dressing in place / Unable to assess Clean;Dry  Drainage Description Serosanguineous -     No Linked orders to display     Pressure Injury 02/12/20 Sacrum Mid Stage 2 -  Partial thickness loss of dermis presenting as a shallow open injury with a red, pink wound bed without slough. (Active)  Date First Assessed/Time First Assessed: 02/12/20 1930   Location: Sacrum  Location Orientation: Mid  Staging: Stage 2 -  Partial thickness loss of dermis presenting as a shallow open injury with a red, pink wound bed without slough.    Assessments 02/12/2020  9:23 PM 03/03/2020  8:15 PM  Dressing Type Foam - Lift dressing to assess site every shift Foam - Lift dressing to assess site every shift  Dressing Changed  Clean;Intact;Dry  Site / Wound Assessment Clean;Dry Clean;Dry  Drainage Amount None -     No Linked orders to display     Other problems: Acute kidney injury -Resolved -Current creatinine 1.01 with a GFR greater than 60  History of CVA/dementia without behavioral disturbances -Remains normotensive off of BP medication -Continue Plavix and low-dose aspirin -Continue statin/Crestor -Continue Cymbalta  Anemia of chronic disease -Hemoglobin stable around 10   Data Reviewed: Basic Metabolic Panel: No results for input(s): NA, K, CL, CO2, GLUCOSE, BUN, CREATININE, CALCIUM, MG, PHOS in the last 168 hours. Liver Function Tests: No results for input(s): AST, ALT, ALKPHOS, BILITOT, PROT, ALBUMIN in the last 168 hours. No results for input(s): LIPASE, AMYLASE in the last 168 hours. No results for input(s): AMMONIA in the last 168 hours. CBC: No results for input(s): WBC, NEUTROABS, HGB, HCT, MCV, PLT in the last 168 hours. Cardiac Enzymes: No results for input(s): CKTOTAL, CKMB, CKMBINDEX, TROPONINI in the last 168 hours. BNP (last 3 results) Recent Labs    12/26/19 1510 01/01/20 1018  BNP 88.7 177.1*    ProBNP (last 3 results) No results for input(s): PROBNP in the last 8760 hours.  CBG: No results for input(s): GLUCAP in the last 168 hours.  Recent Results (from the past 240 hour(s))  SARS CORONAVIRUS 2 (TAT 6-24 HRS) Nasopharyngeal Nasopharyngeal Swab     Status: None   Collection Time: 03/03/20 11:41 AM   Specimen: Nasopharyngeal Swab  Result Value Ref Range  Status   SARS Coronavirus 2 NEGATIVE NEGATIVE Final    Comment: (NOTE) SARS-CoV-2 target nucleic acids are NOT DETECTED.  The SARS-CoV-2 RNA is generally detectable in upper and lower respiratory specimens during the acute phase of infection. Negative results do not preclude SARS-CoV-2 infection, do not rule out co-infections with other pathogens, and should not be used as the sole basis for treatment or  other patient management decisions. Negative results must be combined with clinical observations, patient history, and epidemiological information. The expected result is Negative.  Fact Sheet for Patients: SugarRoll.be  Fact Sheet for Healthcare Providers: https://www.woods-mathews.com/  This test is not yet approved or cleared by the [place] FDA and  has been authorized for detection and/or diagnosis of SARS-CoV-2 by FDA under an Emergency Use Authorization (EUA). This EUA will remain  in effect (meaning this test can be used) for the duration of the COVID-19 declaration under Se ction 564(b)(1) of the Act, 21 U.S.C. section 360bbb-3(b)(1), unless the authorization is terminated or revoked sooner.  Performed at [facility], [place] [address], [place], [place] [address]      Studies: CT HEAD WO CONTRAST  Result Date: 03/02/2020 CLINICAL DATA:  Stroke, follow up new drooling reported by family EXAM: CT HEAD WITHOUT CONTRAST TECHNIQUE: Contiguous axial images were obtained from the base of the skull through the vertex without intravenous contrast. COMPARISON:  Head CT and brain MRI 11/01/2019 FINDINGS: Brain: No hemorrhage or evidence of acute ischemia. Stable degree of atrophy and chronic small vessel ischemia. Expected evolution of right corona radiata lacunar infarct from prior exam with small focal hypodensity. Remote lacunar infarct in the right thalamus. No hydrocephalus, no midline shift or mass effect. No subdural or extra-axial collection. Basilar cisterns are patent. Vascular: Atherosclerosis of skullbase vasculature without hyperdense vessel or abnormal calcification. Skull: No fracture or focal lesion. Sinuses/Orbits: No acute findings. Remote medial left orbital fracture unchanged. Mastoid air cells are clear. Bilateral cataract resection. Other: None. IMPRESSION: 1. No acute intracranial abnormality. 2. Expected evolution of  right corona radiata lacunar infarct from prior exam. Remote lacunar infarct in the right thalamus. 3. Stable atrophy and chronic small vessel ischemia. Electronically Signed   By: Keith Rake M.D.   On: 03/02/2020 15:13    Scheduled Meds: . aspirin EC  81 mg Oral Daily  . clopidogrel  75 mg Oral Daily  . DULoxetine  60 mg Oral Daily  . enoxaparin (LOVENOX) injection  40 mg Subcutaneous Daily  . feeding supplement  237 mL Oral TID BM  . multivitamin with minerals  1 tablet Oral Daily  . rosuvastatin  10 mg Oral Daily   Continuous Infusions:   Active Problems:   Empyema lung (HCC)   Severe sepsis without septic shock ([place])   History of CVA in adulthood   Physical deconditioning   Dementia associated with other underlying disease without behavioral disturbance ([place])   Pressure injury of skin   Consultants:  TCTS  Conventional radiology  Procedures:  Chest tube placement 12/3 by IR  Antibiotics: Anti-infectives (From admission, onward)   Start     Dose/Rate Route Frequency Ordered Stop   02/13/20 0000  amoxicillin-clavulanate (AUGMENTIN) 875-125 MG tablet        1 tablet Oral Every 12 hours 02/13/20 1316 02/14/20 2359   02/05/20 1430  amoxicillin-clavulanate (AUGMENTIN) 875-125 MG per tablet 1 tablet        1 tablet Oral Every 12 hours 02/05/20 1338 02/16/20 2105   01/12/20 1400  metroNIDAZOLE (FLAGYL) tablet 500 mg  Status:  Discontinued        500 mg Oral Every 8 hours 01/12/20 1111 02/05/20 1337   01/12/20 1300  cefTRIAXone (ROCEPHIN) 2 g in sodium chloride 0.9 % 100 mL IVPB  Status:  Discontinued        2 g 200 mL/hr over 30 Minutes Intravenous Every 24 hours 01/12/20 1111 02/05/20 1337   01/02/20 2330  piperacillin-tazobactam (ZOSYN) IVPB 3.375 g  Status:  Discontinued        3.375 g 12.5 mL/hr over 240 Minutes Intravenous Every 8 hours 01/02/20 2242 01/12/20 1111       Time spent: 20 minutes    Erin Hearing ANP  [facility] 7 am- 330 pm/M-F  for direct patient care and secure chat Please refer to Amion for contact information 62 days

## 2020-03-05 MED ORDER — COVID-19 MRNA VAC-TRIS(PFIZER) 30 MCG/0.3ML IM SUSP
0.3000 mL | Freq: Once | INTRAMUSCULAR | Status: AC
  Administered 2020-03-05: 0.3 mL via INTRAMUSCULAR
  Filled 2020-03-05: qty 0.3

## 2020-03-05 NOTE — Progress Notes (Addendum)
DTP CSW and RN CM met with patient and sister [name] [name] at bedside on the afternoon of 03/04/2020 to discuss the meeting that occurred with [facility], [name] [name]. [name] [name] reported she was unwilling to sign any documentation for [name] [name] due to his financial situation regarding the ownership of his home and his reluctance to sell the property to become Medicaid eligible. Patient reports his step son [name] [name] resides in the home, contributing to his reluctance to sell the property. Patient reports he has not seen or spoken to [name] since he has been hospitalized. [name] [name] is unable to access the patient's bank account to monitor any activity due to her not being the legal POA. [name] [name] initially stated agreement to meeting with [facility] staff to complete the necessary paperwork and the patient was in agreement.   [name] [name] reports she lives in [place] in an apartment and receives [address] 8 so the patient is not able to move in with her. [name] [name] reports she is not able to offer the patient daily supervision as his home is in [place] and this would create a financial strain on her.   DTP staff spent an hour with the patient and his sister answering questions, explaining barriers, and allowing the patient to express his desires for his discharge plan. Patient desires to go home instead of going to a facility. Patient fully oriented to person, place, time, and situation.   CSW and RN CM discussed with DTP NP who states although patient is fully oriented, he is unsafe to go home with 24/7 supervision due to his inability to care for himself independently. Early in the patient's hospitalization, NP spoke with [name] [name] who reported he cannot care for the patient at home.  CSW attempted to reach [name] via phone and text message without success on 03/04/2020. CSW received three calls from [name] in the evening hours stating her concerns regarding the meeting that was held - specifically stating she is not  signing any paperwork to assist the patient in his discharge planning.  CSW spoke with [name] [name] at [name] to update the file for patient.   CSW spoke with [name] [name] [phone]) at [name] regarding new information. [name] [name] to attempt to contact [name] [name] for a discussion.  TOC Director and [facility] Supervisor aware of information.  [name] [name], MSW, LCSW-A Transitions of Care  Clinical Social Worker I [phone]

## 2020-03-05 NOTE — Progress Notes (Addendum)
Pt given second dose Covid vaccine in right arm.Pt tolerated well

## 2020-03-05 NOTE — Progress Notes (Signed)
[facility] PROGRESS NOTE  [name] [name] [id number] DOB: May 01, 1940 DOA: 01/02/2020 PCP: [name], [name], MD  Status: Remains inpatient appropriate because:Unsafe d/c plan   Dispo:  Patient From:  Private residence  Planned Disposition:  SNF  Expected discharge date: March 04, 2020  Medically stable for discharge:   Barriers to discharge: Inability to obtain appropriate funding for SNF.  Bed available.  Out-of-pocket expenses for private pay $16,000 per month.  Medicaid co-pay days $184 per day.  Patient owns home and is ineligible for long-term Medicaid since stepson lives in home and patient is unwilling to sell this home.  TOC attempting to contact stepson to confirm he will be able to pick patient up from the hospital and take him back home with home health services.  DSS contacted on 2/3 by [facility] team.  Sister has stated she is not willing to become patient's guardian or POA nor is she willing to accept care of this patient after discharge.  Although patient has episodes of being alert and oriented x3 he continues to demonstrate evidence of mild dementia and just by that alone would not be safe at home independently.  Also based on PT evaluations they are recommending 24/7 supervision with SNF placement.  Also not been able to get in touch with patient's son [name] to confirm if he would be available in the home environment to provide 24/7 care.  Code Status: DNR Family Communication: Son [name] 1/07-TOC has made repeated calls to patient's son on 1/12, 1/13 and 1/14 without any response; TOC spoke with patient's sister on 1/13-TOC attempted to contact son again on 2/2 with no success.  Voicemail and text sent. DVT prophylaxis: Lovenox Vaccination status: 1/10 patient given first dose of Pfizer COVID-vaccine; today on 2/3 I will order second dose of Pfizer COVID vaccine  Foley catheter:  No   HPI: 80 year old male with history of recent CVA in 11/2019 with residual  dysarthria, hypertension, hyperlipidemia and spinal stenosis, initially presented to [facility] from [facility] SNF with confusion and difficulty breathing. Work-up revealed right-sided loculated pleural effusion with concern for empyema. IR at [facility] attempted ultrasound-guided chest tube placement but were unable due to thickness of pleural fluid. He was evaluated by general surgery at [facility] who recommended transfer to [facility] for thoracic surgery evaluation. Patient was evaluated by thoracic surgery who recommended chest tube placement by IR, that was performed on 01/03/2020. ID consulted and recommended transitioning from IV Zosyn to IV ceftriaxone and oral Flagyl while in-house and discharge on p.o. Augmentin for a total of 6 weeks with start date of 01/03/2020. Right-sided chest tube removed 12/16. TCTS signed off 12/17 and advised no outpatient follow-up needed.  Patient stable for discharge.  PT/OT recommending SNF.  Patient will need long-term Medicaid in place before can discharge.  Subjective: Awake sitting up in bed without any specific complaints.  He is aware of the difficulties regarding discharge planning and subsequent discharge disposition.  Objective: Vitals:   03/04/20 2057 03/05/20 0437  BP: 119/75 116/72  Pulse: 77 62  Resp: 16 15  Temp: 98 F (36.7 C) 98 F (36.7 C)  SpO2: 100% 96%    Intake/Output Summary (Last 24 hours) at 03/05/2020 0824 Last data filed at 03/04/2020 2057 Gross per 24 hour  Intake 1080 ml  Output 500 ml  Net 580 ml   Filed Weights   01/03/20 1414  Weight: 66.8 kg    Exam: Constitutional: Awake, calm, no acute distress Respiratory: Anterior lung sounds  clear, stable on room air Cardiovascular: S1-S2, regular pulse, normotensive, extremities warm to touch Abdomen: Soft nontender nondistended bowel sounds present.  Tolerating percent of meals.  LBM 2/2 Neurologic: CN 2-12 grossly intact. Sensation intact,  Strength 5/5 x on left slightly  weaker 4/5 on the right Psychiatric: Awake with flat affect.  Oriented to name and place, not to year.  Appears oriented to situation at times.   Assessment/Plan: Acute problems: Acute hypoxemic respiratory failure  2/2 right-sided parapneumonic effusion, empyema  **SEPSIS RULED OUT** -Hypoxemia (resolved)-TCTS consulted -s/p pigtail chest tube on 01/03/2020 and was removed on 12/16. -Follow-up chest x-ray 12/17 without pneumothorax.   -Thoracic surgery signed off 12/17, prn OP follow-up recommended -Initially was treated with IV Zosyn beginning on 12/3) and subsequently transitioned to Augmentin for a total of 6 weeks of treatment-completed antibiotics on 02/17/2020.   Physical deconditioning/gait disturbance -PT and OT recommending SNF patient unable to obtain funding to transition to SNF.  It is likely will need to pursue discharge to home with son with home health PT/OT/RN/social worker  Hypertension -Controlled off antihypertensive medication  Severe protein calorie malnutrition Nutrition Problem: Severe Malnutrition Etiology: chronic illness (CVA) Signs/Symptoms: moderate fat depletion,severe fat depletion,moderate muscle depletion,severe muscle depletion Interventions: Ensure Enlive (each supplement provides 350kcal and 20 grams of protein),Magic cup,MVI -As of 12/23 patient able to feed self and eating meals well (between 50% and 100% of meals over the past 24 hours) Estimated body mass index is 21.13 kg/m as calculated from the following:   Height as of this encounter: 5\' 10"  (1.778 m).   Weight as of this encounter: 66.8 kg.   Wounds: Incision (Closed) 01/03/20 (Active)  Date First Assessed/Time First Assessed: 01/03/20 2000      Assessments 01/03/2020  8:00 PM 02/29/2020  9:30 AM  Dressing Dry;Clean;Intact Clean;Dry;Intact  Site / Wound Assessment Dressing in place / Unable to assess Clean;Dry  Drainage Description Serosanguineous -     No Linked orders to display      Pressure Injury 02/12/20 Sacrum Mid Stage 2 -  Partial thickness loss of dermis presenting as a shallow open injury with a red, pink wound bed without slough. (Active)  Date First Assessed/Time First Assessed: 02/12/20 1930   Location: Sacrum  Location Orientation: Mid  Staging: Stage 2 -  Partial thickness loss of dermis presenting as a shallow open injury with a red, pink wound bed without slough.    Assessments 02/12/2020  9:23 PM 03/03/2020  8:15 PM  Dressing Type Foam - Lift dressing to assess site every shift Foam - Lift dressing to assess site every shift  Dressing Changed Clean;Intact;Dry  Site / Wound Assessment Clean;Dry Clean;Dry  Drainage Amount None -     No Linked orders to display     Other problems: Acute kidney injury -Resolved -Current creatinine 1.01 with a GFR greater than 60  History of CVA/dementia without behavioral disturbances -Remains normotensive off of BP medication -Continue Plavix and low-dose aspirin -Continue statin/Crestor -Continue Cymbalta  Anemia of chronic disease -Hemoglobin stable around 10   Data Reviewed: Basic Metabolic Panel: No results for input(s): NA, K, CL, CO2, GLUCOSE, BUN, CREATININE, CALCIUM, MG, PHOS in the last 168 hours. Liver Function Tests: No results for input(s): AST, ALT, ALKPHOS, BILITOT, PROT, ALBUMIN in the last 168 hours. No results for input(s): LIPASE, AMYLASE in the last 168 hours. No results for input(s): AMMONIA in the last 168 hours. CBC: No results for input(s): WBC, NEUTROABS, HGB, HCT, MCV, PLT in the  last 168 hours. Cardiac Enzymes: No results for input(s): CKTOTAL, CKMB, CKMBINDEX, TROPONINI in the last 168 hours. BNP (last 3 results) Recent Labs    12/26/19 1510 01/01/20 1018  BNP 88.7 177.1*    ProBNP (last 3 results) No results for input(s): PROBNP in the last 8760 hours.  CBG: No results for input(s): GLUCAP in the last 168 hours.  Recent Results (from the past 240 hour(s))  SARS  CORONAVIRUS 2 (TAT 6-24 HRS) Nasopharyngeal Nasopharyngeal Swab     Status: None   Collection Time: 03/03/20 11:41 AM   Specimen: Nasopharyngeal Swab  Result Value Ref Range Status   SARS Coronavirus 2 NEGATIVE NEGATIVE Final    Comment: (NOTE) SARS-CoV-2 target nucleic acids are NOT DETECTED.  The SARS-CoV-2 RNA is generally detectable in upper and lower respiratory specimens during the acute phase of infection. Negative results do not preclude SARS-CoV-2 infection, do not rule out co-infections with other pathogens, and should not be used as the sole basis for treatment or other patient management decisions. Negative results must be combined with clinical observations, patient history, and epidemiological information. The expected result is Negative.  Fact Sheet for Patients: SugarRoll.be  Fact Sheet for Healthcare Providers: https://www.woods-mathews.com/  This test is not yet approved or cleared by the [place] FDA and  has been authorized for detection and/or diagnosis of SARS-CoV-2 by FDA under an Emergency Use Authorization (EUA). This EUA will remain  in effect (meaning this test can be used) for the duration of the COVID-19 declaration under Se ction 564(b)(1) of the Act, 21 U.S.C. section 360bbb-3(b)(1), unless the authorization is terminated or revoked sooner.  Performed at [facility], [place] [address] [place], [place] [address]      Studies: No results found.  Scheduled Meds: . aspirin EC  81 mg Oral Daily  . clopidogrel  75 mg Oral Daily  . DULoxetine  60 mg Oral Daily  . enoxaparin (LOVENOX) injection  40 mg Subcutaneous Daily  . feeding supplement  237 mL Oral TID BM  . multivitamin with minerals  1 tablet Oral Daily  . rosuvastatin  10 mg Oral Daily   Continuous Infusions:   Active Problems:   Empyema lung (HCC)   Severe sepsis without septic shock ([place])   History of CVA in adulthood   Physical  deconditioning   Dementia associated with other underlying disease without behavioral disturbance ([place])   Pressure injury of skin   Consultants:  TCTS  Conventional radiology  Procedures:  Chest tube placement 12/3 by IR  Antibiotics: Anti-infectives (From admission, onward)   Start     Dose/Rate Route Frequency Ordered Stop   02/13/20 0000  amoxicillin-clavulanate (AUGMENTIN) 875-125 MG tablet        1 tablet Oral Every 12 hours 02/13/20 1316 02/14/20 2359   02/05/20 1430  amoxicillin-clavulanate (AUGMENTIN) 875-125 MG per tablet 1 tablet        1 tablet Oral Every 12 hours 02/05/20 1338 02/16/20 2105   01/12/20 1400  metroNIDAZOLE (FLAGYL) tablet 500 mg  Status:  Discontinued        500 mg Oral Every 8 hours 01/12/20 1111 02/05/20 1337   01/12/20 1300  cefTRIAXone (ROCEPHIN) 2 g in sodium chloride 0.9 % 100 mL IVPB  Status:  Discontinued        2 g 200 mL/hr over 30 Minutes Intravenous Every 24 hours 01/12/20 1111 02/05/20 1337   01/02/20 2330  piperacillin-tazobactam (ZOSYN) IVPB 3.375 g  Status:  Discontinued  3.375 g 12.5 mL/hr over 240 Minutes Intravenous Every 8 hours 01/02/20 2242 01/12/20 1111       Time spent: 20 minutes    Erin Hearing ANP  [facility] 7 am- 330 pm/M-F for direct patient care and secure chat Please refer to Amion for contact information 63 days

## 2020-03-05 NOTE — Progress Notes (Signed)
Patient on consult list for AD. Chaplain took a packet to his room and he was asleep. Chaplain spoke to patient's nurse and she would speak to him when he awakens to find out of he wants a medical POA because he has a living will and is DNR. Chaplain will follow up.   03/05/20 1000  Clinical Encounter Type  Visited With Health care provider  Visit Type Initial  Referral From Nurse  Consult/Referral To Chaplain  Spiritual Encounters  Spiritual Needs Literature

## 2020-03-06 NOTE — Progress Notes (Signed)
[facility] PROGRESS NOTE  [name] [name] ALP:[id number] DOB: 09/20/1940 DOA: 01/02/2020 PCP: [name], [name], MD  Status: Remains inpatient appropriate because:Unsafe d/c plan   Dispo:  Patient From:  Private residence  Planned Disposition:  SNF  Expected discharge date: March 04, 2020  Medically stable for discharge:   Barriers to discharge: Inability to obtain appropriate funding for SNF.  Bed available.  Out-of-pocket expenses for private pay $16,000 per month.  Medicaid co-pay days $184 per day.  Patient owns home and is ineligible for long-term Medicaid since stepson lives in home and patient is unwilling to sell this home.  TOC attempting to contact stepson to confirm he will be able to pick patient up from the hospital and take him back home with home health services.  DSS contacted on 2/3 by [facility] team.  Sister has stated she is not willing to become patient's guardian or POA nor is she willing to accept care of this patient after discharge.  Although patient has episodes of being alert and oriented x3 he continues to demonstrate evidence of mild dementia and just by that alone would not be safe at home independently.  Also based on PT evaluations they are recommending 24/7 supervision with SNF placement.  Also not been able to get in touch with patient's son [name] [name] to confirm if he would be available in the home environment to provide 24/7 care.  Code Status: DNR Family Communication: Son [name] [name] 1/07-TOC has made repeated calls to patient's son on 1/12, 1/13 and 1/14 without any response; TOC spoke with patient's sister on 1/13-TOC attempted to contact son again on 2/2 with no success.  Voicemail and text sent. DVT prophylaxis: Lovenox Vaccination status: 1/10 patient given first dose of Pfizer COVID-vaccine; today on given second dose of COVID vaccine on 2/3  Foley catheter:  No   HPI: 80 year old male with history of recent CVA in 11/2019 with residual dysarthria,  hypertension, hyperlipidemia and spinal stenosis, initially presented to [facility] from [facility] SNF with confusion and difficulty breathing. Work-up revealed right-sided loculated pleural effusion with concern for empyema. IR at [facility] attempted ultrasound-guided chest tube placement but were unable due to thickness of pleural fluid. He was evaluated by general surgery at [facility] who recommended transfer to [facility] for thoracic surgery evaluation. Patient was evaluated by thoracic surgery who recommended chest tube placement by IR, that was performed on 01/03/2020. ID consulted and recommended transitioning from IV Zosyn to IV ceftriaxone and oral Flagyl while in-house and discharge on p.o. Augmentin for a total of 6 weeks with start date of 01/03/2020. Right-sided chest tube removed 12/16. TCTS signed off 12/17 and advised no outpatient follow-up needed.  Patient stable for discharge.  PT/OT recommending SNF.  Patient will need long-term Medicaid in place before can discharge.  Subjective: Alert, sitting up in bed eating breakfast.  Interactive.  No complaints.  Objective: Vitals:   03/05/20 2019 03/06/20 0407  BP: 130/80 134/84  Pulse: 73 (!) 58  Resp: 17 16  Temp: 98.4 F (36.9 C) 97.6 F (36.4 C)  SpO2: 97% 100%    Intake/Output Summary (Last 24 hours) at 03/06/2020 0857 Last data filed at 03/06/2020 0501 Gross per 24 hour  Intake 700 ml  Output 1550 ml  Net -850 ml   Filed Weights   01/03/20 1414  Weight: 66.8 kg    Exam: Constitutional: Calm, alert, no acute distress Respiratory: Clear to auscultation, stable on room air Cardiovascular: S1-S2, pulse regular, extremities warm to touch  with adequate capillary refill, no peripheral edema Abdomen: Soft and nontender.  Normoactive bowel sounds.  LBM 2/2 Neurologic: CN 2-12 grossly intact. Sensation intact,  Strength 5/5 x on left slightly weaker 4/5 on the right Psychiatric: Awake with flat affect.  Oriented to name and place,  not to year.  Appears oriented to situation at times.   Assessment/Plan: Acute problems: Acute hypoxemic respiratory failure  2/2 right-sided parapneumonic effusion, empyema  **SEPSIS RULED OUT** -Hypoxemia (resolved)-TCTS consulted -s/p pigtail chest tube on 01/03/2020 and was removed on 12/16. -Follow-up chest x-ray 12/17 without pneumothorax.   -Thoracic surgery signed off 12/17, prn OP follow-up recommended -Initially was treated with IV Zosyn beginning on 12/3) and subsequently transitioned to Augmentin for a total of 6 weeks of treatment-completed antibiotics on 02/17/2020.   Physical deconditioning/gait disturbance -PT and OT recommending SNF patient unable to obtain funding to transition to SNF.  It is likely will need to pursue discharge to home with son with home health PT/OT/RN/social worker  Hypertension -Controlled off antihypertensive medication  Severe protein calorie malnutrition Nutrition Problem: Severe Malnutrition Etiology: chronic illness (CVA) Signs/Symptoms: moderate fat depletion,severe fat depletion,moderate muscle depletion,severe muscle depletion Interventions: Ensure Enlive (each supplement provides 350kcal and 20 grams of protein),Magic cup,MVI -As of 12/23 patient able to feed self and eating meals well (between 50% and 100% of meals over the past 24 hours) Estimated body mass index is 21.13 kg/m as calculated from the following:   Height as of this encounter: 5\' 10"  (1.778 m).   Weight as of this encounter: 66.8 kg.   Wounds: Incision (Closed) 01/03/20 (Active)  Date First Assessed/Time First Assessed: 01/03/20 2000      Assessments 01/03/2020  8:00 PM 02/29/2020  9:30 AM  Dressing Dry;Clean;Intact Clean;Dry;Intact  Site / Wound Assessment Dressing in place / Unable to assess Clean;Dry  Drainage Description Serosanguineous --     No Linked orders to display     Pressure Injury 02/12/20 Sacrum Mid Stage 2 -  Partial thickness loss of dermis presenting  as a shallow open injury with a red, pink wound bed without slough. (Active)  Date First Assessed/Time First Assessed: 02/12/20 1930   Location: Sacrum  Location Orientation: Mid  Staging: Stage 2 -  Partial thickness loss of dermis presenting as a shallow open injury with a red, pink wound bed without slough.    Assessments 02/12/2020  9:23 PM 03/03/2020  8:15 PM  Dressing Type Foam - Lift dressing to assess site every shift Foam - Lift dressing to assess site every shift  Dressing Changed Clean;Intact;Dry  Site / Wound Assessment Clean;Dry Clean;Dry  Drainage Amount None --     No Linked orders to display     Other problems: Acute kidney injury -Resolved -Current creatinine 1.01 with a GFR greater than 60  History of CVA/dementia without behavioral disturbances -Remains normotensive off of BP medication -Continue Plavix and low-dose aspirin -Continue statin/Crestor -Continue Cymbalta  Anemia of chronic disease -Hemoglobin stable around 10   Data Reviewed: Basic Metabolic Panel: No results for input(s): NA, K, CL, CO2, GLUCOSE, BUN, CREATININE, CALCIUM, MG, PHOS in the last 168 hours. Liver Function Tests: No results for input(s): AST, ALT, ALKPHOS, BILITOT, PROT, ALBUMIN in the last 168 hours. No results for input(s): LIPASE, AMYLASE in the last 168 hours. No results for input(s): AMMONIA in the last 168 hours. CBC: No results for input(s): WBC, NEUTROABS, HGB, HCT, MCV, PLT in the last 168 hours. Cardiac Enzymes: No results for input(s): CKTOTAL, CKMB,  CKMBINDEX, TROPONINI in the last 168 hours. BNP (last 3 results) Recent Labs    12/26/19 1510 01/01/20 1018  BNP 88.7 177.1*    ProBNP (last 3 results) No results for input(s): PROBNP in the last 8760 hours.  CBG: No results for input(s): GLUCAP in the last 168 hours.  Recent Results (from the past 240 hour(s))  SARS CORONAVIRUS 2 (TAT 6-24 HRS) Nasopharyngeal Nasopharyngeal Swab     Status: None   Collection  Time: 03/03/20 11:41 AM   Specimen: Nasopharyngeal Swab  Result Value Ref Range Status   SARS Coronavirus 2 NEGATIVE NEGATIVE Final    Comment: (NOTE) SARS-CoV-2 target nucleic acids are NOT DETECTED.  The SARS-CoV-2 RNA is generally detectable in upper and lower respiratory specimens during the acute phase of infection. Negative results do not preclude SARS-CoV-2 infection, do not rule out co-infections with other pathogens, and should not be used as the sole basis for treatment or other patient management decisions. Negative results must be combined with clinical observations, patient history, and epidemiological information. The expected result is Negative.  Fact Sheet for Patients: SugarRoll.be  Fact Sheet for Healthcare Providers: https://www.woods-mathews.com/  This test is not yet approved or cleared by the [place] FDA and  has been authorized for detection and/or diagnosis of SARS-CoV-2 by FDA under an Emergency Use Authorization (EUA). This EUA will remain  in effect (meaning this test can be used) for the duration of the COVID-19 declaration under Se ction 564(b)(1) of the Act, 21 U.S.C. section 360bbb-3(b)(1), unless the authorization is terminated or revoked sooner.  Performed at [facility], [place] [address] [place], [place] [address]      Studies: No results found.  Scheduled Meds: . aspirin EC  81 mg Oral Daily  . clopidogrel  75 mg Oral Daily  . DULoxetine  60 mg Oral Daily  . enoxaparin (LOVENOX) injection  40 mg Subcutaneous Daily  . feeding supplement  237 mL Oral TID BM  . multivitamin with minerals  1 tablet Oral Daily  . rosuvastatin  10 mg Oral Daily   Continuous Infusions:   Active Problems:   Empyema lung (HCC)   Severe sepsis without septic shock ([place])   History of CVA in adulthood   Physical deconditioning   Dementia associated with other underlying disease without behavioral  disturbance ([place])   Pressure injury of skin   Consultants:  TCTS  Conventional radiology  Procedures:  Chest tube placement 12/3 by IR  Antibiotics: Anti-infectives (From admission, onward)   Start     Dose/Rate Route Frequency Ordered Stop   02/13/20 0000  amoxicillin-clavulanate (AUGMENTIN) 875-125 MG tablet        1 tablet Oral Every 12 hours 02/13/20 1316 02/14/20 2359   02/05/20 1430  amoxicillin-clavulanate (AUGMENTIN) 875-125 MG per tablet 1 tablet        1 tablet Oral Every 12 hours 02/05/20 1338 02/16/20 2105   01/12/20 1400  metroNIDAZOLE (FLAGYL) tablet 500 mg  Status:  Discontinued        500 mg Oral Every 8 hours 01/12/20 1111 02/05/20 1337   01/12/20 1300  cefTRIAXone (ROCEPHIN) 2 g in sodium chloride 0.9 % 100 mL IVPB  Status:  Discontinued        2 g 200 mL/hr over 30 Minutes Intravenous Every 24 hours 01/12/20 1111 02/05/20 1337   01/02/20 2330  piperacillin-tazobactam (ZOSYN) IVPB 3.375 g  Status:  Discontinued        3.375 g 12.5 mL/hr over  240 Minutes Intravenous Every 8 hours 01/02/20 2242 01/12/20 1111       Time spent: 20 minutes    Erin Hearing ANP  [facility] 7 am- 330 pm/M-F for direct patient care and secure chat Please refer to Amion for contact information 64 days

## 2020-03-07 NOTE — Progress Notes (Signed)
PROGRESS NOTE  Brief Narrative: [name] [name] is a 80 y.o. male with a history of CVA Oct 2021 with residual deficits including dysarthria, HTN, HLD, spinal stenosis who presented to [facility] from [facility] 01/02/2020 due to dyspnea, found to have right-sided empyema. IR was unable to place chest tube, so pt transferred to [facility] for CT surgery consult and ultimately have chest tube placed by IR 01/02/2020. ID was consulted, guided 6 weeks of antibiotic therapy which the patient has completed with eventual removal of chest tube on 12/16. Discharge to facility has been complicated by the patient's lack of long term medicaid, though he is stable for discharge. It appears this medicaid has been approved and plan is for discharge 03/04/2020.   Subjective: Pt without complaints today. No further drooling episodes.  Objective: BP 122/67 (BP Location: Left Arm)   Pulse 64   Temp 98.1 F (36.7 C) (Oral)   Resp 18   Ht 5\' 10"  (1.778 m)   Wt 66.8 kg   SpO2 100%   BMI 21.13 kg/m   Gen: Elderly, chronically ill-appearing male eating lunch independently Pulm: Nonlabored, clear CV: RRR, no edema GI: Soft, NT, ND Neuro: Alert, essentially oriented, no new focal deficits on limited exam. Skin: No rashes, lesions or ulcers on visualized skin.  Assessment & Plan: This 80yo gentleman with recent stroke, discharged to SNF from which he was admitted for empyema and has completed successful treatment with chest tube (now removed) and 6 weeks of antibiotics (completed). Placement complicated and is being addressed diligently by our [facility] team. He remains medically stable for discharge.   Got 2nd dose of covid vaccine on 2/3. Will recommend booster in 5 months.  Time spent: 15 minutes.  Patrecia Pour, MD Pager on [facility] 03/07/2020, 4:03 PM

## 2020-03-08 NOTE — Progress Notes (Signed)
PROGRESS NOTE  Brief Narrative: [name] [name] is a 80 y.o. male with a history of CVA Oct 2021 with residual deficits including dysarthria, HTN, HLD, spinal stenosis who presented to [facility] from [facility] 01/02/2020 due to dyspnea, found to have right-sided empyema. IR was unable to place chest tube, so pt transferred to [facility] for CT surgery consult and ultimately have chest tube placed by IR 01/02/2020. ID was consulted, guided 6 weeks of antibiotic therapy which the patient has completed with eventual removal of chest tube on 12/16. Discharge to facility has been complicated by the patient's lack of long term medicaid, though he is stable for discharge. It appears this medicaid has been approved and plan is for discharge 03/04/2020.   Subjective: No overnight events. No complaints.  Objective: BP 121/75 (BP Location: Left Arm)   Pulse 65   Temp 97.9 F (36.6 C) (Oral)   Resp 16   Ht 5\' 10"  (1.778 m)   Wt 66.8 kg   SpO2 100%   BMI 21.13 kg/m   Gen: Elderly, chronically ill-appearing Pulm: Nonlabored, clear  Assessment & Plan: This 80yo gentleman with recent stroke, discharged to SNF from which he was admitted for empyema and has completed successful treatment with chest tube (now removed) and 6 weeks of antibiotics (completed). Placement complicated and is being addressed diligently by our [facility] team. He remains medically stable for discharge.  Got 2nd dose of covid vaccine on 2/3. Will recommend booster in 5 months.  Time spent: 15 minutes.  Patrecia Pour, MD Pager on amion 03/08/2020, 12:07 PM

## 2020-03-09 NOTE — Progress Notes (Signed)
[facility] PROGRESS NOTE  [name] [name] [id number] DOB: 07-04-1940 DOA: 01/02/2020 PCP: [name], [name], MD  Status: Remains inpatient appropriate because:Unsafe d/c plan   Dispo:  Patient From:  Private residence  Planned Disposition:  SNF  Expected discharge date: March 04, 2020  Medically stable for discharge:   Barriers to discharge: Inability to obtain appropriate funding for SNF.  Bed available.  Out-of-pocket expenses for private pay $16,000 per month.  Medicaid co-pay days $184 per day.  Patient owns home and is ineligible for long-term Medicaid since stepson lives in home and patient is unwilling to sell this home.  TOC attempting to contact stepson to confirm he will be able to pick patient up from the hospital and take him back home with home health services.  DSS contacted on 2/3 by [facility] team.  Sister has stated she is not willing to become patient's guardian or POA nor is she willing to accept care of this patient after discharge.  Although patient has episodes of being alert and oriented x3 he continues to demonstrate evidence of mild dementia and just by that alone would not be safe at home independently.  Also based on PT evaluations they are recommending 24/7 supervision with SNF placement.  Also not been able to get in touch with patient's son [name] to confirm if he would be available in the home environment to provide 24/7 care.  Likely will need guardianship through DSS  Code Status: DNR Family Communication: Son [name] 1/07-TOC has made repeated calls to patient's son on 1/12, 1/13 and 1/14 without any response; TOC spoke with patient's sister on 1/13-TOC attempted to contact son again on 2/2 with no success.  Voicemail and text sent. DVT prophylaxis: Lovenox Vaccination status: 1/10 patient given first dose of Pfizer COVID-vaccine; today on given second dose of COVID vaccine on 2/3  Foley catheter:  No   HPI: 80 year old male with history of  recent CVA in 11/2019 with residual dysarthria, hypertension, hyperlipidemia and spinal stenosis, initially presented to [facility] from [facility] SNF with confusion and difficulty breathing. Work-up revealed right-sided loculated pleural effusion with concern for empyema. IR at [facility] attempted ultrasound-guided chest tube placement but were unable due to thickness of pleural fluid. He was evaluated by general surgery at [facility] who recommended transfer to [facility] for thoracic surgery evaluation. Patient was evaluated by thoracic surgery who recommended chest tube placement by IR, that was performed on 01/03/2020. ID consulted and recommended transitioning from IV Zosyn to IV ceftriaxone and oral Flagyl while in-house and discharge on p.o. Augmentin for a total of 6 weeks with start date of 01/03/2020. Right-sided chest tube removed 12/16. TCTS signed off 12/17 and advised no outpatient follow-up needed.  Patient stable for discharge.  PT/OT recommending SNF.  Patient will need long-term Medicaid in place before can discharge.  Subjective: Awake.  Has completed breakfast.  Sitting in bed.  No complaints.  Aware we are trying to secure a safe discharge disposition.  Objective: Vitals:   03/08/20 2022 03/09/20 0332  BP: 99/72 115/66  Pulse: 95 70  Resp: 18 18  Temp: 98 F (36.7 C) 98 F (36.7 C)  SpO2: 100% 97%    Intake/Output Summary (Last 24 hours) at 03/09/2020 0827 Last data filed at 03/09/2020 0334 Gross per 24 hour  Intake 457 ml  Output 1450 ml  Net -993 ml   Filed Weights   01/03/20 1414  Weight: 66.8 kg    Exam: Constitutional: Awake and in no  acute distress, calm. Respiratory: Lungs clear to auscultation anteriorly, stable on room air Cardiovascular: Normal heart sounds, no peripheral edema, pulse regular, skin warm and dry Abdomen: Abdomen soft nontender nondistended.  Patient eating well.  Bowel sounds present.  LBM 2/5 Neurologic: CN 2-12 grossly intact. Sensation  intact,  Strength 5/5 x on left slightly weaker 4/5 on the right Psychiatric: Awake with flat affect.  Oriented to name and place, not to year.  Appears oriented to situation at times.   Assessment/Plan: Acute problems: Acute hypoxemic respiratory failure  2/2 right-sided parapneumonic effusion, empyema  **SEPSIS RULED OUT** -Hypoxemia (resolved)-TCTS consulted -s/p pigtail chest tube on 01/03/2020 and was removed on 12/16. -Follow-up chest x-ray 12/17 without pneumothorax.   -Thoracic surgery signed off 12/17, prn OP follow-up recommended -Initially was treated with IV Zosyn/ Augmentin -completed antibiotics on 02/17/2020.   Physical deconditioning/gait disturbance -PT and OT recommending SNF patient unable to obtain funding to transition to SNF.  It is likely will need to pursue discharge to home with son with home health PT/OT/RN/social worker  Hypertension -Controlled off antihypertensive medication  Severe protein calorie malnutrition Nutrition Problem: Severe Malnutrition Etiology: chronic illness (CVA) Signs/Symptoms: moderate fat depletion,severe fat depletion,moderate muscle depletion,severe muscle depletion Interventions: Ensure Enlive (each supplement provides 350kcal and 20 grams of protein),Magic cup,MVI -As of 12/23 patient able to feed self and eating meals well (between 50% and 100% of meals over the past 24 hours) Estimated body mass index is 21.13 kg/m as calculated from the following:   Height as of this encounter: 5\' 10"  (1.778 m).   Weight as of this encounter: 66.8 kg.   Wounds: Incision (Closed) 01/03/20 (Active)  Date First Assessed/Time First Assessed: 01/03/20 2000      Assessments 01/03/2020  8:00 PM 02/29/2020  9:30 AM  Dressing Dry;Clean;Intact Clean;Dry;Intact  Site / Wound Assessment Dressing in place / Unable to assess Clean;Dry  Drainage Description Serosanguineous --     No Linked orders to display     Pressure Injury 02/12/20 Sacrum Mid Stage 2 -   Partial thickness loss of dermis presenting as a shallow open injury with a red, pink wound bed without slough. (Active)  Date First Assessed/Time First Assessed: 02/12/20 1930   Location: Sacrum  Location Orientation: Mid  Staging: Stage 2 -  Partial thickness loss of dermis presenting as a shallow open injury with a red, pink wound bed without slough.    Assessments 02/12/2020  9:23 PM 03/03/2020  8:15 PM  Dressing Type Foam - Lift dressing to assess site every shift Foam - Lift dressing to assess site every shift  Dressing Changed Clean;Intact;Dry  Site / Wound Assessment Clean;Dry Clean;Dry  Drainage Amount None --     No Linked orders to display     Other problems: Acute kidney injury -Resolved -Current creatinine 1.01 with a GFR greater than 60  History of CVA/dementia without behavioral disturbances -Remains normotensive off of BP medication -Continue Plavix and low-dose aspirin -Continue statin/Crestor -Continue Cymbalta  Anemia of chronic disease -Hemoglobin stable around 10   Data Reviewed: Basic Metabolic Panel: No results for input(s): NA, K, CL, CO2, GLUCOSE, BUN, CREATININE, CALCIUM, MG, PHOS in the last 168 hours. Liver Function Tests: No results for input(s): AST, ALT, ALKPHOS, BILITOT, PROT, ALBUMIN in the last 168 hours. No results for input(s): LIPASE, AMYLASE in the last 168 hours. No results for input(s): AMMONIA in the last 168 hours. CBC: No results for input(s): WBC, NEUTROABS, HGB, HCT, MCV, PLT in the last  168 hours. Cardiac Enzymes: No results for input(s): CKTOTAL, CKMB, CKMBINDEX, TROPONINI in the last 168 hours. BNP (last 3 results) Recent Labs    12/26/19 1510 01/01/20 1018  BNP 88.7 177.1*    ProBNP (last 3 results) No results for input(s): PROBNP in the last 8760 hours.  CBG: No results for input(s): GLUCAP in the last 168 hours.  Recent Results (from the past 240 hour(s))  SARS CORONAVIRUS 2 (TAT 6-24 HRS) Nasopharyngeal  Nasopharyngeal Swab     Status: None   Collection Time: 03/03/20 11:41 AM   Specimen: Nasopharyngeal Swab  Result Value Ref Range Status   SARS Coronavirus 2 NEGATIVE NEGATIVE Final    Comment: (NOTE) SARS-CoV-2 target nucleic acids are NOT DETECTED.  The SARS-CoV-2 RNA is generally detectable in upper and lower respiratory specimens during the acute phase of infection. Negative results do not preclude SARS-CoV-2 infection, do not rule out co-infections with other pathogens, and should not be used as the sole basis for treatment or other patient management decisions. Negative results must be combined with clinical observations, patient history, and epidemiological information. The expected result is Negative.  Fact Sheet for Patients: SugarRoll.be  Fact Sheet for Healthcare Providers: https://www.woods-mathews.com/  This test is not yet approved or cleared by the [place] FDA and  has been authorized for detection and/or diagnosis of SARS-CoV-2 by FDA under an Emergency Use Authorization (EUA). This EUA will remain  in effect (meaning this test can be used) for the duration of the COVID-19 declaration under Se ction 564(b)(1) of the Act, 21 U.S.C. section 360bbb-3(b)(1), unless the authorization is terminated or revoked sooner.  Performed at [facility], [place] [address], [place], [place] [address]      Studies: No results found.  Scheduled Meds: . aspirin EC  81 mg Oral Daily  . clopidogrel  75 mg Oral Daily  . DULoxetine  60 mg Oral Daily  . enoxaparin (LOVENOX) injection  40 mg Subcutaneous Daily  . feeding supplement  237 mL Oral TID BM  . multivitamin with minerals  1 tablet Oral Daily  . rosuvastatin  10 mg Oral Daily   Continuous Infusions:   Active Problems:   Empyema lung (HCC)   Severe sepsis without septic shock ([place])   History of CVA in adulthood   Physical deconditioning   Dementia associated with  other underlying disease without behavioral disturbance ([place])   Pressure injury of skin   Consultants:  TCTS  Conventional radiology  Procedures:  Chest tube placement 12/3 by IR  Antibiotics: Anti-infectives (From admission, onward)   Start     Dose/Rate Route Frequency Ordered Stop   02/13/20 0000  amoxicillin-clavulanate (AUGMENTIN) 875-125 MG tablet        1 tablet Oral Every 12 hours 02/13/20 1316 02/14/20 2359   02/05/20 1430  amoxicillin-clavulanate (AUGMENTIN) 875-125 MG per tablet 1 tablet        1 tablet Oral Every 12 hours 02/05/20 1338 02/16/20 2105   01/12/20 1400  metroNIDAZOLE (FLAGYL) tablet 500 mg  Status:  Discontinued        500 mg Oral Every 8 hours 01/12/20 1111 02/05/20 1337   01/12/20 1300  cefTRIAXone (ROCEPHIN) 2 g in sodium chloride 0.9 % 100 mL IVPB  Status:  Discontinued        2 g 200 mL/hr over 30 Minutes Intravenous Every 24 hours 01/12/20 1111 02/05/20 1337   01/02/20 2330  piperacillin-tazobactam (ZOSYN) IVPB 3.375 g  Status:  Discontinued  3.375 g 12.5 mL/hr over 240 Minutes Intravenous Every 8 hours 01/02/20 2242 01/12/20 1111       Time spent: 20 minutes    Erin Hearing ANP  [facility] 7 am- 330 pm/M-F for direct patient care and secure chat Please refer to Amion for contact information 67 days

## 2020-03-09 NOTE — Progress Notes (Signed)
Physical Therapy Treatment Patient Details Name: [name] [name] MRN: [id number] DOB: Nov 19, 1940 Today's Date: 03/09/2020    History of Present Illness 80 year old male with history of recent CVA in 11/2019 with residual dysarthria, hypertension, hyperlipidemia and spinal stenosis, initially presented to [facility] from [facility] SNF with confusion and difficulty breathing.  Work-up revealed right-sided loculated pleural effusion with concern for empyema. Transfer to [facility]. Patient was evaluated by thoracic surgery who recommended chest tube placement by IR, that was performed on 01/03/2020.  ID consulted and recommended transitioning from IV Zosyn to IV ceftriaxone and oral Flagyl while in-house and discharge on p.o. Augmentin for a total of 6 weeks with start date of 01/03/2020. Right-sided chest tube removed 12/16.    PT Comments    Pt remains flat with poor safety awareness and poor ability to make safe decisions with mobility.  Educated pre gt training that no chair follow was present.  He reports," I can make it.  If you are tired let me know."  Pt pushed him self to ambulate further but ultimately required nurse to bring recliner chair for safe transport back to room.  Pt with poor stride length and cadence decreased and distance progressed.  Continue to recommend SNF.   appears to be approaching baseline at this time.    Follow Up Recommendations  SNF     Equipment Recommendations  Rolling walker with 5" wheels;3in1 (PT)    Recommendations for Other Services       Precautions / Restrictions Precautions Precautions: Fall Precaution Comments: hx of incontinence, bring a brief Restrictions Weight Bearing Restrictions: No    Mobility  Bed Mobility               General bed mobility comments: Up in chair upon PT arrival.  Transfers Overall transfer level: Needs assistance Equipment used: Rolling walker (2 wheeled) Transfers: Sit to/from Stand Sit to Stand: Min  assist Stand pivot transfers: Min assist       General transfer comment: Min assistance to boost into standing.  Poor hand placement despite cueing.  Ambulation/Gait Ambulation/Gait assistance: Min guard Gait Distance (Feet): 225 Feet Assistive device: Rolling walker (2 wheeled) Gait Pattern/deviations: Shuffle;Decreased stride length;Trunk flexed;Step-through pattern;Decreased step length - left     General Gait Details: Slow, mildly unsteady gait with RW for support; Min guard for balance/safety.   Stairs             Wheelchair Mobility    Modified Rankin (Stroke Patients Only)       Balance Overall balance assessment: Needs assistance Sitting-balance support: Feet supported;No upper extremity supported Sitting balance-Leahy Scale: Fair       Standing balance-Leahy Scale: Poor                              Cognition Arousal/Alertness: Awake/alert Behavior During Therapy: Flat affect Overall Cognitive Status: History of cognitive impairments - at baseline Area of Impairment: Problem solving;Following commands;Safety/judgement                       Following Commands: Follows one step commands with increased time Safety/Judgement: Decreased awareness of safety;Decreased awareness of deficits   Problem Solving: Slow processing;Requires verbal cues;Requires tactile cues;Difficulty sequencing;Decreased initiation General Comments: Pt with poor safety awarenss.  [name] [name] educated to think ahead of how much energy he has a find a turn around point for gt training.  He became tired in hall and required chair  for transfer back to room.  Unable to understand need for pacing.      Exercises      General Comments        Pertinent Vitals/Pain Pain Assessment: No/denies pain    Home Living                      Prior Function            PT Goals (current goals can now be found in the care plan section) Acute Rehab PT Goals Patient  Stated Goal: get stronger Potential to Achieve Goals: Fair Progress towards PT goals: Progressing toward goals    Frequency    Min 2X/week      PT Plan Current plan remains appropriate    Co-evaluation              AM-PAC PT "6 Clicks" Mobility   Outcome Measure  Help needed turning from your back to your side while in a flat bed without using bedrails?: A Little Help needed moving from lying on your back to sitting on the side of a flat bed without using bedrails?: A Little Help needed moving to and from a bed to a chair (including a wheelchair)?: A Little Help needed standing up from a chair using your arms (e.g., wheelchair or bedside chair)?: A Little Help needed to walk in hospital room?: A Little Help needed climbing 3-5 steps with a railing? : A Little 6 Click Score: 18    End of Session Equipment Utilized During Treatment: Gait belt Activity Tolerance: Patient tolerated treatment well Patient left: in chair;with call bell/phone within reach;with nursing/sitter in room;with chair alarm set Nurse Communication: Mobility status;Other (comment) (Pt had BM and needs primo fit replaced.) PT Visit Diagnosis: Difficulty in walking, not elsewhere classified (R26.2);Muscle weakness (generalized) (M62.81)     Time: 7026-3785 PT Time Calculation (min) (ACUTE ONLY): 46 min  Charges:  $Gait Training: 23-37 mins $Therapeutic Activity: 8-22 mins                     [name] Brink R. , [name] [name] Acute Rehabilitation Services Pager [phone] Office [phone]     [name] [name] 03/09/2020, 2:08 PM

## 2020-03-10 NOTE — Progress Notes (Signed)
[facility] PROGRESS NOTE  [name] [name] BZJ:[id number] DOB: February 14, 1940 DOA: 01/02/2020 PCP: [name], [name], MD  Status: Remains inpatient appropriate because:Unsafe d/c plan   Dispo:  Patient From:  Private residence  Planned Disposition:  SNF vs private residence if 24/7 care confirm  Expected discharge date: March 04, 2020  Medically stable for discharge:   Barriers to discharge: Inability to obtain appropriate funding for SNF.  Bed available.  Out-of-pocket expenses for private pay $16,000 per month.  Medicaid co-pay days $184 per day.  Patient owns home and is ineligible for long-term Medicaid since stepson lives in home and patient is unwilling to sell this home.  TOC attempting to contact stepson to confirm he will be able to pick patient up from the hospital and take him back home with home health services.  DSS contacted on 2/3 by [facility] team.  Sister has stated she is not willing to become patient's guardian or POA nor is she willing to accept care of this patient after discharge.  Although patient has episodes of being alert and oriented x3 he continues to demonstrate evidence of mild dementia and just by that alone would not be safe at home independently.  Also based on PT evaluations they are recommending 24/7 supervision with SNF placement.  Also not been able to get in touch with patient's son [name] [name] to confirm if he would be available in the home environment to provide 24/7 care.  Likely will need guardianship through DSS  Code Status: DNR Family Communication: Son [name] [name] 1/07-TOC has made repeated calls to patient's son on 1/12, 1/13 and 1/14 without any response; TOC spoke with patient's sister on 1/13-TOC attempted to contact son again on 2/2 with no success.  Voicemail and text sent. DVT prophylaxis: Lovenox Vaccination status: 1/10 patient given first dose of Pfizer COVID-vaccine; today on given second dose of COVID vaccine on 2/3  Foley catheter:  No    HPI: 80 year old male with history of recent CVA in 11/2019 with residual dysarthria, hypertension, hyperlipidemia and spinal stenosis, initially presented to [facility] from [facility] SNF with confusion and difficulty breathing. Work-up revealed right-sided loculated pleural effusion with concern for empyema. IR at [facility] attempted ultrasound-guided chest tube placement but were unable due to thickness of pleural fluid. He was evaluated by general surgery at [facility] who recommended transfer to [facility] for thoracic surgery evaluation. Patient was evaluated by thoracic surgery who recommended chest tube placement by IR, that was performed on 01/03/2020. ID consulted and recommended transitioning from IV Zosyn to IV ceftriaxone and oral Flagyl while in-house and discharge on p.o. Augmentin for a total of 6 weeks with start date of 01/03/2020. Right-sided chest tube removed 12/16. TCTS signed off 12/17 and advised no outpatient follow-up needed.  Patient stable for discharge.  PT/OT recommending SNF.  Patient will need long-term Medicaid in place before can discharge.  Subjective: Awake and quite talkative today.  States once again he has not heard from either of his sons.  States that his Dandridge bought a truck for about $7000 and subsequently has spent around $15,000 or more upgrading it with large tires and other amenities.  Patient reports that prior to coming into the hospital he placed his old truck in the shop to have the motor repaired.  He paid $1600 and still owed $130 and he suspects his truck is still in the garage.  He asked [place] if we could contact his niece but he could not remember her telephone number  and we do not have her number on record.  Case management to contact patient's sister to see if she has this information.  Objective: Vitals:   03/09/20 2107 03/10/20 0502  BP: 130/81 139/85  Pulse: 75 62  Resp: 16 15  Temp: 98 F (36.7 C) 98 F (36.7 C)  SpO2: 97% 99%     Intake/Output Summary (Last 24 hours) at 03/10/2020 0810 Last data filed at 03/10/2020 0516 Gross per 24 hour  Intake 780 ml  Output 900 ml  Net -120 ml   Filed Weights   01/03/20 1414  Weight: 66.8 kg    Exam: Constitutional: Awake alert and in no acute distress Respiratory: Anterior lung sounds clear, stable on room air Cardiovascular: Heart sounds are normal S1-S2, pulses regular and nontachycardic, extremities warm to touch without any peripheral edema Abdomen: Soft nontender.  Tolerating meals eating 100% of breakfast trays.  LBM 2/5 Neurologic: CN 2-12 grossly intact. Sensation intact,  Strength 5/5 x on left slightly weaker 4/5 on the right Psychiatric: Awake with flat affect.  Oriented to name and place, not to year.  Appears oriented to situation at times.   Assessment/Plan: Acute problems: Acute hypoxemic respiratory failure  2/2 right-sided parapneumonic effusion, empyema  **SEPSIS RULED OUT** -Hypoxemia (resolved)-TCTS consulted -s/p pigtail chest tube on 01/03/2020 and was removed on 12/16. -Follow-up chest x-ray 12/17 without pneumothorax.   -Thoracic surgery signed off 12/17, prn OP follow-up recommended -Initially was treated with IV Zosyn/ Augmentin -completed antibiotics on 02/17/2020.   Physical deconditioning/gait disturbance -PT and OT recommending SNF patient unable to obtain funding to transition to SNF.  It is likely will need to pursue discharge to home with son with home health PT/OT/RN/social worker **PT NOTE FROM 2/7** Pt remains flat with poor safety awareness and poor ability to make safe decisions with mobility.  Educated pre gt training that no chair follow was present.  He reports," I can make it.  If you are tired let me know."  Pt pushed him self to ambulate further but ultimately required nurse to bring recliner chair for safe transport back to room.  Pt with poor stride length and cadence decreased and distance progressed.  Continue to recommend  SNF.   appears to be approaching baseline at this time.    Hypertension -Controlled off antihypertensive medication  Severe protein calorie malnutrition Nutrition Problem: Severe Malnutrition Etiology: chronic illness (CVA) Signs/Symptoms: moderate fat depletion,severe fat depletion,moderate muscle depletion,severe muscle depletion Interventions: Ensure Enlive (each supplement provides 350kcal and 20 grams of protein),Magic cup,MVI -As of 12/23 patient able to feed self and eating meals well (between 50% and 100% of meals over the past 24 hours) Estimated body mass index is 21.13 kg/m as calculated from the following:   Height as of this encounter: 5\' 10"  (1.778 m).   Weight as of this encounter: 66.8 kg.   Wounds: Incision (Closed) 01/03/20 (Active)  Date First Assessed/Time First Assessed: 01/03/20 2000      Assessments 01/03/2020  8:00 PM 02/29/2020  9:30 AM  Dressing Dry;Clean;Intact Clean;Dry;Intact  Site / Wound Assessment Dressing in place / Unable to assess Clean;Dry  Drainage Description Serosanguineous --     No Linked orders to display     Pressure Injury 02/12/20 Sacrum Mid Stage 2 -  Partial thickness loss of dermis presenting as a shallow open injury with a red, pink wound bed without slough. (Active)  Date First Assessed/Time First Assessed: 02/12/20 1930   Location: Sacrum  Location Orientation: Mid  Staging: Stage 2 -  Partial thickness loss of dermis presenting as a shallow open injury with a red, pink wound bed without slough.    Assessments 02/12/2020  9:23 PM 03/03/2020  8:15 PM  Dressing Type Foam - Lift dressing to assess site every shift Foam - Lift dressing to assess site every shift  Dressing Changed Clean;Intact;Dry  Site / Wound Assessment Clean;Dry Clean;Dry  Drainage Amount None --     No Linked orders to display     Other problems: Acute kidney injury -Resolved -Current creatinine 1.01 with a GFR greater than 60  History of CVA/dementia without  behavioral disturbances -Remains normotensive off of BP medication -Continue Plavix and low-dose aspirin -Continue statin/Crestor -Continue Cymbalta  Anemia of chronic disease -Hemoglobin stable around 10   Data Reviewed: Basic Metabolic Panel: No results for input(s): NA, K, CL, CO2, GLUCOSE, BUN, CREATININE, CALCIUM, MG, PHOS in the last 168 hours. Liver Function Tests: No results for input(s): AST, ALT, ALKPHOS, BILITOT, PROT, ALBUMIN in the last 168 hours. No results for input(s): LIPASE, AMYLASE in the last 168 hours. No results for input(s): AMMONIA in the last 168 hours. CBC: No results for input(s): WBC, NEUTROABS, HGB, HCT, MCV, PLT in the last 168 hours. Cardiac Enzymes: No results for input(s): CKTOTAL, CKMB, CKMBINDEX, TROPONINI in the last 168 hours. BNP (last 3 results) Recent Labs    12/26/19 1510 01/01/20 1018  BNP 88.7 177.1*    ProBNP (last 3 results) No results for input(s): PROBNP in the last 8760 hours.  CBG: No results for input(s): GLUCAP in the last 168 hours.  Recent Results (from the past 240 hour(s))  SARS CORONAVIRUS 2 (TAT 6-24 HRS) Nasopharyngeal Nasopharyngeal Swab     Status: None   Collection Time: 03/03/20 11:41 AM   Specimen: Nasopharyngeal Swab  Result Value Ref Range Status   SARS Coronavirus 2 NEGATIVE NEGATIVE Final    Comment: (NOTE) SARS-CoV-2 target nucleic acids are NOT DETECTED.  The SARS-CoV-2 RNA is generally detectable in upper and lower respiratory specimens during the acute phase of infection. Negative results do not preclude SARS-CoV-2 infection, do not rule out co-infections with other pathogens, and should not be used as the sole basis for treatment or other patient management decisions. Negative results must be combined with clinical observations, patient history, and epidemiological information. The expected result is Negative.  Fact Sheet for Patients: SugarRoll.be  Fact Sheet  for Healthcare Providers: https://www.woods-mathews.com/  This test is not yet approved or cleared by the [place] FDA and  has been authorized for detection and/or diagnosis of SARS-CoV-2 by FDA under an Emergency Use Authorization (EUA). This EUA will remain  in effect (meaning this test can be used) for the duration of the COVID-19 declaration under Se ction 564(b)(1) of the Act, 21 U.S.C. section 360bbb-3(b)(1), unless the authorization is terminated or revoked sooner.  Performed at [facility], [place] [address] [place], [place] [address]      Studies: No results found.  Scheduled Meds: . aspirin EC  81 mg Oral Daily  . clopidogrel  75 mg Oral Daily  . DULoxetine  60 mg Oral Daily  . enoxaparin (LOVENOX) injection  40 mg Subcutaneous Daily  . feeding supplement  237 mL Oral TID BM  . multivitamin with minerals  1 tablet Oral Daily  . rosuvastatin  10 mg Oral Daily   Continuous Infusions:   Active Problems:   Empyema lung (HCC)   Severe sepsis without septic shock ([place])  History of CVA in adulthood   Physical deconditioning   Dementia associated with other underlying disease without behavioral disturbance ([place])   Pressure injury of skin   Consultants:  TCTS  Conventional radiology  Procedures:  Chest tube placement 12/3 by IR  Antibiotics: Anti-infectives (From admission, onward)   Start     Dose/Rate Route Frequency Ordered Stop   02/13/20 0000  amoxicillin-clavulanate (AUGMENTIN) 875-125 MG tablet        1 tablet Oral Every 12 hours 02/13/20 1316 02/14/20 2359   02/05/20 1430  amoxicillin-clavulanate (AUGMENTIN) 875-125 MG per tablet 1 tablet        1 tablet Oral Every 12 hours 02/05/20 1338 02/16/20 2105   01/12/20 1400  metroNIDAZOLE (FLAGYL) tablet 500 mg  Status:  Discontinued        500 mg Oral Every 8 hours 01/12/20 1111 02/05/20 1337   01/12/20 1300  cefTRIAXone (ROCEPHIN) 2 g in sodium chloride 0.9 % 100 mL IVPB  Status:   Discontinued        2 g 200 mL/hr over 30 Minutes Intravenous Every 24 hours 01/12/20 1111 02/05/20 1337   01/02/20 2330  piperacillin-tazobactam (ZOSYN) IVPB 3.375 g  Status:  Discontinued        3.375 g 12.5 mL/hr over 240 Minutes Intravenous Every 8 hours 01/02/20 2242 01/12/20 1111       Time spent: 20 minutes    Erin Hearing ANP  [facility] 7 am- 330 pm/M-F for direct patient care and secure chat Please refer to Amion for contact information 68 days

## 2020-03-11 NOTE — Progress Notes (Signed)
Occupational Therapy Treatment Patient Details Name: [name] [name] MRN: [id number] DOB: 09-26-1940 Today's Date: 03/11/2020    History of present illness 80 year old male with history of recent CVA in 11/2019 with residual dysarthria, hypertension, hyperlipidemia and spinal stenosis, initially presented to [facility] from [facility] SNF with confusion and difficulty breathing.  Work-up revealed right-sided loculated pleural effusion with concern for empyema. Transfer to [facility]. Patient was evaluated by thoracic surgery who recommended chest tube placement by IR, that was performed on 01/03/2020.  ID consulted and recommended transitioning from IV Zosyn to IV ceftriaxone and oral Flagyl while in-house and discharge on p.o. Augmentin for a total of 6 weeks with start date of 01/03/2020. Right-sided chest tube removed 12/16.   OT comments  Pt in bed upon arrival statng that he wanted to get up. Pt sat EOB min guard A. Stood with RW min A. Pt with soiled pads once he stood. Pt ambulated to bathroom for toileting tasks and stood at sink for hygiene and grooming. Pt required verbal cues for safe hand placement using RW. Pt would continue to benefit from acute OT services to address impairments to maximize level of function and safety.  Follow Up Recommendations  SNF;Supervision/Assistance - 24 hour    Equipment Recommendations  Other (comment) (TBD at next venue of care)    Recommendations for Other Services      Precautions / Restrictions Precautions Precautions: Fall Precaution Comments: hx of bowel incontinence Restrictions Weight Bearing Restrictions: No       Mobility Bed Mobility Overal bed mobility: Needs Assistance Bed Mobility: Supine to Sit     Supine to sit: Min guard     General bed mobility comments: increased time and effort, HOB elevated, used rail  Transfers Overall transfer level: Needs assistance Equipment used: Rolling walker (2 wheeled) Transfers: Sit to/from  Stand Sit to Stand: Min assist Stand pivot transfers: Min assist       General transfer comment: verbal cues for correct hand placement    Balance Overall balance assessment: Needs assistance Sitting-balance support: Feet supported;No upper extremity supported Sitting balance-Leahy Scale: Fair     Standing balance support: During functional activity;Bilateral upper extremity supported Standing balance-Leahy Scale: Poor                             ADL either performed or assessed with clinical judgement   ADL Overall ADL's : Needs assistance/impaired Eating/Feeding: Set up;Sitting   Grooming: Oral care;Wash/dry hands;Wash/dry face;Min guard;Standing   Upper Body Bathing: Set up;Supervision/ safety;Sitting Upper Body Bathing Details (indicate cue type and reason): simulated seated EOB     Upper Body Dressing : Set up;Supervision/safety;Sitting Upper Body Dressing Details (indicate cue type and reason): donning clean gown seated EOB Lower Body Dressing: Total assistance;Sit to/from stand Lower Body Dressing Details (indicate cue type and reason): total A to doff dirty socks and don clean socks Toilet Transfer: Minimal assistance;RW;Ambulation;Comfort height toilet;Grab bars;Cueing for safety   Toileting- Clothing Manipulation and Hygiene: Maximal assistance Toileting - Clothing Manipulation Details (indicate cue type and reason): for posterior pericare in standing     Functional mobility during ADLs: Minimal assistance;Min guard;Rolling walker;Cueing for safety General ADL Comments: pt continues to present with decreased activity tolerance, generalized weakness and impaired balance. Bowel incontinence once pt stood from EOB to RW     Vision Patient Visual Report: No change from baseline     Perception     Praxis  Cognition Arousal/Alertness: Awake/alert Behavior During Therapy: Flat affect Overall Cognitive Status: History of cognitive impairments -  at baseline                       Memory: Decreased short-term memory   Safety/Judgement: Decreased awareness of safety;Decreased awareness of deficits   Problem Solving: Slow processing;Requires verbal cues;Requires tactile cues;Difficulty sequencing;Decreased initiation          Exercises     Shoulder Instructions       General Comments      Pertinent Vitals/ Pain       Pain Assessment: No/denies pain Pain Score: 0-No pain Pain Intervention(s): Monitored during session  Home Living                                          Prior Functioning/Environment              Frequency  Min 1X/week        Progress Toward Goals  OT Goals(current goals can now be found in the care plan section)  Progress towards OT goals: OT to reassess next treatment  ADL Goals Pt Will Perform Grooming: with supervision;with set-up;standing Pt Will Perform Upper Body Dressing: with supervision;with set-up;sitting Pt Will Transfer to Toilet: with min guard assist;with supervision;ambulating Pt Will Perform Toileting - Clothing Manipulation and hygiene: with min guard assist;sit to/from stand Additional ADL Goal #1: Pt will perform bed mobility Sup/mod I to sit EOB in prep for ADLs/ADL transfers  Plan Discharge plan remains appropriate    Co-evaluation                 AM-PAC OT "6 Clicks" Daily Activity     Outcome Measure   Help from another person eating meals?: None Help from another person taking care of personal grooming?: A Little Help from another person toileting, which includes using toliet, bedpan, or urinal?: A Lot Help from another person bathing (including washing, rinsing, drying)?: A Lot Help from another person to put on and taking off regular upper body clothing?: None Help from another person to put on and taking off regular lower body clothing?: Total 6 Click Score: 16    End of Session Equipment Utilized During Treatment:  Gait belt;Rolling walker  OT Visit Diagnosis: Unsteadiness on feet (R26.81);Other abnormalities of gait and mobility (R26.89);Muscle weakness (generalized) (M62.81);Other symptoms and signs involving cognitive function   Activity Tolerance Patient tolerated treatment well   Patient Left in chair;with call bell/phone within reach;with chair alarm set   Nurse Communication Mobility status        Time: 4742-5956 OT Time Calculation (min): 23 min  Charges: OT General Charges $OT Visit: 1 Visit OT Treatments $Self Care/Home Management : 8-22 mins $Therapeutic Activity: 8-22 mins     Britt Bottom 03/11/2020, 2:28 PM

## 2020-03-11 NOTE — Progress Notes (Addendum)
[facility] PROGRESS NOTE  [name] [name] MVH:[id number] DOB: 1940/12/08 DOA: 01/02/2020 PCP: [name], [name], MD  Status: Remains inpatient appropriate because:Unsafe d/c plan   Dispo:  Patient From:  Private residence  Planned Disposition:  SNF vs private residence if 24/7 care confirm  Expected discharge date: March 04, 2020  Medically stable for discharge:   Barriers to discharge: Inability to obtain appropriate funding for SNF.  Bed available.  Out-of-pocket expenses for private pay $16,000 per month.  Medicaid co-pay days $184 per day.  Patient owns home and is ineligible for long-term Medicaid since stepson lives in home and patient is unwilling to sell this home.  TOC attempting to contact stepson to confirm he will be able to pick patient up from the hospital and take him back home with home health services.  DSS contacted on 2/3 by [facility] team.  Sister has stated she is not willing to become patient's guardian or POA nor is she willing to accept care of this patient after discharge.  Although patient has episodes of being alert and oriented x3 he continues to demonstrate evidence of mild dementia and just by that alone would not be safe at home independently.  Also based on PT evaluations they are recommending 24/7 supervision with SNF placement.  Also not been able to get in touch with patient's son [name] [name] to confirm if he would be available in the home environment to provide 24/7 care.  Likely will need guardianship through DSS-as of 2/9 we will be touching base with DSS to determine if/when a court date has been established regarding guardianship process  Code Status: DNR Family Communication: Son [name] [name] 1/07-TOC has made repeated calls to patient's son on 1/12, 1/13 and 1/14 without any response; TOC spoke with patient's sister on 1/13-TOC attempted to contact son again on 2/2 with no success.  Voicemail and text sent.  Attempted to contact son again on 2/4 with no  success. DVT prophylaxis: Lovenox Vaccination status: 1/10 patient given first dose of Pfizer COVID-vaccine; today on given second dose of COVID vaccine on 2/3  Foley catheter:  No   HPI: 80 year old male with history of recent CVA in 11/2019 with residual dysarthria, hypertension, hyperlipidemia and spinal stenosis, initially presented to [facility] from [facility] SNF with confusion and difficulty breathing. Work-up revealed right-sided loculated pleural effusion with concern for empyema. IR at [facility] attempted ultrasound-guided chest tube placement but were unable due to thickness of pleural fluid. He was evaluated by general surgery at [facility] who recommended transfer to [facility] for thoracic surgery evaluation. Patient was evaluated by thoracic surgery who recommended chest tube placement by IR, that was performed on 01/03/2020. ID consulted and recommended transitioning from IV Zosyn to IV ceftriaxone and oral Flagyl while in-house and discharge on p.o. Augmentin for a total of 6 weeks with start date of 01/03/2020. Right-sided chest tube removed 12/16. TCTS signed off 12/17 and advised no outpatient follow-up needed.  Patient stable for discharge.  PT/OT recommending SNF.  Patient will need long-term Medicaid in place before can discharge.  Subjective: Awake and quite interactive again today.  Specific questions or complaints.  Sitting up in bed  Objective: Vitals:   03/10/20 2037 03/11/20 0342  BP: 118/66 133/70  Pulse: 72 63  Resp: 18 19  Temp: 97.8 F (36.6 C) 97.6 F (36.4 C)  SpO2: 99% 100%    Intake/Output Summary (Last 24 hours) at 03/11/2020 0814 Last data filed at 03/11/2020 0340 Gross per 24 hour  Intake 360 ml  Output 1150 ml  Net -790 ml   Filed Weights   01/03/20 1414  Weight: 66.8 kg    Exam: Constitutional: Alert, calm, no acute distress Respiratory: Able on room air, lung sounds clear Cardiovascular: No edema, no tachycardia, normal heart sounds Abdomen:  Soft nontender with normoactive bowel sounds.  Eating well.  LBM 2/5 Neurologic: CN 2-12 grossly intact. Sensation intact,  Strength 5/5 x on left slightly weaker 4/5 on the right Psychiatric: Awake with flat affect but is oriented to name and place.   Assessment/Plan: Acute problems: Acute hypoxemic respiratory failure  2/2 right-sided parapneumonic effusion, empyema  **SEPSIS RULED OUT** -Hypoxemia (resolved)-TCTS consulted -s/p pigtail chest tube on 01/03/2020 and was removed on 12/16. -Follow-up chest x-ray 12/17 without pneumothorax.   -Thoracic surgery signed off 12/17, prn OP follow-up recommended -Initially was treated with IV Zosyn/ Augmentin -completed antibiotics on 02/17/2020.   Physical deconditioning/gait disturbance -PT and OT recommending SNF patient unable to obtain funding to transition to SNF.  It is likely will need to pursue discharge to home with son with home health PT/OT/RN/social worker  **PT NOTE FROM 2/7** Pt remains flat with poor safety awareness and poor ability to make safe decisions with mobility.  Educated pre gt training that no chair follow was present.  He reports," I can make it.  If you are tired let me know."  Pt pushed him self to ambulate further but ultimately required nurse to bring recliner chair for safe transport back to room.  Pt with poor stride length and cadence decreased and distance progressed.  Continue to recommend SNF.   appears to be approaching baseline at this time.    Hypertension -Controlled off antihypertensive medication  Severe protein calorie malnutrition Nutrition Problem: Severe Malnutrition Etiology: chronic illness (CVA) Signs/Symptoms: moderate fat depletion,severe fat depletion,moderate muscle depletion,severe muscle depletion Interventions: Ensure Enlive (each supplement provides 350kcal and 20 grams of protein),Magic cup,MVI -As of 12/23 patient able to feed self and eating meals well (between 50% and 100% of meals over  the past 24 hours) Estimated body mass index is 21.13 kg/m as calculated from the following:   Height as of this encounter: 5\' 10"  (1.778 m).   Weight as of this encounter: 66.8 kg.   Wounds: Incision (Closed) 01/03/20 (Active)  Date First Assessed/Time First Assessed: 01/03/20 2000      Assessments 01/03/2020  8:00 PM 02/29/2020  9:30 AM  Dressing Dry;Clean;Intact Clean;Dry;Intact  Site / Wound Assessment Dressing in place / Unable to assess Clean;Dry  Drainage Description Serosanguineous --     No Linked orders to display     Pressure Injury 02/12/20 Sacrum Mid Stage 2 -  Partial thickness loss of dermis presenting as a shallow open injury with a red, pink wound bed without slough. (Active)  Date First Assessed/Time First Assessed: 02/12/20 1930   Location: Sacrum  Location Orientation: Mid  Staging: Stage 2 -  Partial thickness loss of dermis presenting as a shallow open injury with a red, pink wound bed without slough.    Assessments 02/12/2020  9:23 PM 03/03/2020  8:15 PM  Dressing Type Foam - Lift dressing to assess site every shift Foam - Lift dressing to assess site every shift  Dressing Changed Clean;Intact;Dry  Site / Wound Assessment Clean;Dry Clean;Dry  Drainage Amount None --     No Linked orders to display     Other problems: Acute kidney injury -Resolved -Current creatinine 1.01 with a GFR greater than 60  History of CVA/dementia without behavioral disturbances -Remains normotensive off of BP medication -Continue Plavix and low-dose aspirin -Continue statin/Crestor -Continue Cymbalta  Anemia of chronic disease -Hemoglobin stable around 10   Data Reviewed: Basic Metabolic Panel: No results for input(s): NA, K, CL, CO2, GLUCOSE, BUN, CREATININE, CALCIUM, MG, PHOS in the last 168 hours. Liver Function Tests: No results for input(s): AST, ALT, ALKPHOS, BILITOT, PROT, ALBUMIN in the last 168 hours. No results for input(s): LIPASE, AMYLASE in the last 168  hours. No results for input(s): AMMONIA in the last 168 hours. CBC: No results for input(s): WBC, NEUTROABS, HGB, HCT, MCV, PLT in the last 168 hours. Cardiac Enzymes: No results for input(s): CKTOTAL, CKMB, CKMBINDEX, TROPONINI in the last 168 hours. BNP (last 3 results) Recent Labs    12/26/19 1510 01/01/20 1018  BNP 88.7 177.1*    ProBNP (last 3 results) No results for input(s): PROBNP in the last 8760 hours.  CBG: No results for input(s): GLUCAP in the last 168 hours.  Recent Results (from the past 240 hour(s))  SARS CORONAVIRUS 2 (TAT 6-24 HRS) Nasopharyngeal Nasopharyngeal Swab     Status: None   Collection Time: 03/03/20 11:41 AM   Specimen: Nasopharyngeal Swab  Result Value Ref Range Status   SARS Coronavirus 2 NEGATIVE NEGATIVE Final    Comment: (NOTE) SARS-CoV-2 target nucleic acids are NOT DETECTED.  The SARS-CoV-2 RNA is generally detectable in upper and lower respiratory specimens during the acute phase of infection. Negative results do not preclude SARS-CoV-2 infection, do not rule out co-infections with other pathogens, and should not be used as the sole basis for treatment or other patient management decisions. Negative results must be combined with clinical observations, patient history, and epidemiological information. The expected result is Negative.  Fact Sheet for Patients: SugarRoll.be  Fact Sheet for Healthcare Providers: https://www.woods-mathews.com/  This test is not yet approved or cleared by the [place] FDA and  has been authorized for detection and/or diagnosis of SARS-CoV-2 by FDA under an Emergency Use Authorization (EUA). This EUA will remain  in effect (meaning this test can be used) for the duration of the COVID-19 declaration under Se ction 564(b)(1) of the Act, 21 U.S.C. section 360bbb-3(b)(1), unless the authorization is terminated or revoked sooner.  Performed at [facility], [place] [address], [place], [place] [address]      Studies: No results found.  Scheduled Meds: . aspirin EC  81 mg Oral Daily  . clopidogrel  75 mg Oral Daily  . DULoxetine  60 mg Oral Daily  . enoxaparin (LOVENOX) injection  40 mg Subcutaneous Daily  . feeding supplement  237 mL Oral TID BM  . multivitamin with minerals  1 tablet Oral Daily  . rosuvastatin  10 mg Oral Daily   Continuous Infusions:   Active Problems:   Empyema lung (HCC)   Severe sepsis without septic shock ([place])   History of CVA in adulthood   Physical deconditioning   Dementia associated with other underlying disease without behavioral disturbance ([place])   Pressure injury of skin   Consultants:  TCTS  Conventional radiology  Procedures:  Chest tube placement 12/3 by IR  Antibiotics: Anti-infectives (From admission, onward)   Start     Dose/Rate Route Frequency Ordered Stop   02/13/20 0000  amoxicillin-clavulanate (AUGMENTIN) 875-125 MG tablet        1 tablet Oral Every 12 hours 02/13/20 1316 02/14/20 2359   02/05/20 1430  amoxicillin-clavulanate (AUGMENTIN) 875-125 MG per tablet  1 tablet        1 tablet Oral Every 12 hours 02/05/20 1338 02/16/20 2105   01/12/20 1400  metroNIDAZOLE (FLAGYL) tablet 500 mg  Status:  Discontinued        500 mg Oral Every 8 hours 01/12/20 1111 02/05/20 1337   01/12/20 1300  cefTRIAXone (ROCEPHIN) 2 g in sodium chloride 0.9 % 100 mL IVPB  Status:  Discontinued        2 g 200 mL/hr over 30 Minutes Intravenous Every 24 hours 01/12/20 1111 02/05/20 1337   01/02/20 2330  piperacillin-tazobactam (ZOSYN) IVPB 3.375 g  Status:  Discontinued        3.375 g 12.5 mL/hr over 240 Minutes Intravenous Every 8 hours 01/02/20 2242 01/12/20 1111       Time spent: 20 minutes    Erin Hearing ANP  [facility] 7 am- 330 pm/M-F for direct patient care and secure chat Please refer to Amion for contact information 69 days  I have evaluated the patient and reviewed the  chart. I agree with the above note.   Debbe Odea, MD

## 2020-03-12 NOTE — Progress Notes (Signed)
Physical Therapy Treatment Patient Details Name: [name] [name] MRN: [id number] DOB: 06-Nov-1940 Today's Date: 03/12/2020    History of Present Illness 80 year old male with history of recent CVA in 11/2019 with residual dysarthria, hypertension, hyperlipidemia and spinal stenosis, initially presented to [facility] from [facility] SNF with confusion and difficulty breathing.  Work-up revealed right-sided loculated pleural effusion with concern for empyema. Transfer to [facility]. Patient was evaluated by thoracic surgery who recommended chest tube placement by IR, that was performed on 01/03/2020.  ID consulted and recommended transitioning from IV Zosyn to IV ceftriaxone and oral Flagyl while in-house and discharge on p.o. Augmentin for a total of 6 weeks with start date of 01/03/2020. Right-sided chest tube removed 12/16.    PT Comments    Pt resting upon PT arrival to room, appears upset that he is being encouraged to mobilize but is agreeable. Pt fecal and urinary incontinent x2 this session, requiring clean up assist from PT. Pt overall requiring min guard to min assist during mobility today for steadying and safety, ambulating hallway distance with use of RW. PT cued pt for improving gait parameters, but pt does not apply to mobility. Will continue to follow .   Follow Up Recommendations  SNF     Equipment Recommendations  Rolling walker with 5" wheels;3in1 (PT)    Recommendations for Other Services       Precautions / Restrictions Precautions Precautions: Fall Precaution Comments: urinary and fecal incontinence Restrictions Weight Bearing Restrictions: No    Mobility  Bed Mobility Overal bed mobility: Needs Assistance Bed Mobility: Supine to Sit     Supine to sit: Min assist     General bed mobility comments: min assist for trunk elevation, increased time and effort.    Transfers Overall transfer level: Needs assistance Equipment used: Rolling walker (2  wheeled) Transfers: Sit to/from Stand Sit to Stand: Min assist         General transfer comment: Min assist for power up, rise, and steady. Verbal cuing for hand placement. STS x3, from EOB, toilet, and chair in hallway.  Ambulation/Gait Ambulation/Gait assistance: Min guard Gait Distance (Feet): 65 Feet Assistive device: Rolling walker (2 wheeled) Gait Pattern/deviations: Step-through pattern;Decreased stride length;Shuffle;Narrow base of support;Trunk flexed Gait velocity: decr   General Gait Details: Min assist to steady, physically maneuver RW. Cues for upright posture, placement in RW, increasing step length, and widening BOS. Limited by fecal and urinary incontinence in hallway.   Stairs             Wheelchair Mobility    Modified Rankin (Stroke Patients Only)       Balance Overall balance assessment: Needs assistance Sitting-balance support: Feet supported;No upper extremity supported Sitting balance-Leahy Scale: Fair Sitting balance - Comments: able to sit EOB without PT support   Standing balance support: During functional activity;Bilateral upper extremity supported Standing balance-Leahy Scale: Poor Standing balance comment: reliant on external assist                            Cognition Arousal/Alertness: Awake/alert Behavior During Therapy: Flat affect Overall Cognitive Status: History of cognitive impairments - at baseline                       Memory: Decreased short-term memory Following Commands: Follows one step commands with increased time Safety/Judgement: Decreased awareness of safety;Decreased awareness of deficits Awareness: Intellectual Problem Solving: Slow processing;Requires verbal cues;Requires tactile cues;Difficulty sequencing;Decreased initiation General  Comments: Pt appears disgruntled at PT, states "you've been working this job too long, it's making you wacky". Pt lacks insight into deficits, thinks he is  more able than he his mobility-wise      Exercises      General Comments General comments (skin integrity, edema, etc.): PT assisting with pericare x2, secondary ot fecal and urinary incontinence      Pertinent Vitals/Pain Pain Assessment: Faces Faces Pain Scale: Hurts a little bit Pain Location: buttocks, during pericare Pain Descriptors / Indicators: Discomfort Pain Intervention(s): Repositioned;Monitored during session;Limited activity within patient's tolerance    Home Living                      Prior Function            PT Goals (current goals can now be found in the care plan section) Acute Rehab PT Goals Patient Stated Goal: get stronger Time For Goal Achievement: 03/16/20 Potential to Achieve Goals: Fair Progress towards PT goals: Progressing toward goals    Frequency    Min 1X/week      PT Plan Current plan remains appropriate    Co-evaluation              AM-PAC PT "6 Clicks" Mobility   Outcome Measure  Help needed turning from your back to your side while in a flat bed without using bedrails?: A Little Help needed moving from lying on your back to sitting on the side of a flat bed without using bedrails?: A Little Help needed moving to and from a bed to a chair (including a wheelchair)?: A Little Help needed standing up from a chair using your arms (e.g., wheelchair or bedside chair)?: A Little Help needed to walk in hospital room?: A Little Help needed climbing 3-5 steps with a railing? : A Lot 6 Click Score: 17    End of Session   Activity Tolerance: Patient limited by fatigue Patient left: with call bell/phone within reach;in bed;with bed alarm set Nurse Communication: Mobility status;Other (comment) (needs a new primo fit) PT Visit Diagnosis: Difficulty in walking, not elsewhere classified (R26.2);Muscle weakness (generalized) (M62.81)     Time: 9417-4081 PT Time Calculation (min) (ACUTE ONLY): 47 min  Charges:  $Gait  Training: 8-22 mins $Therapeutic Activity: 23-37 mins                    Stacie Glaze, PT Acute Rehabilitation Services Pager [phone]  Office [phone]    [name] [name] 03/12/2020, 4:38 PM

## 2020-03-12 NOTE — Progress Notes (Signed)
[facility] PROGRESS NOTE  [name] [name] PNT:[id number] DOB: [birth year] DOA: 01/02/2020 PCP: [name], [name], MD  Status: Remains inpatient appropriate because:Unsafe d/c plan   Dispo:  Patient From:  Private residence  Planned Disposition:  SNF vs private residence if 24/7 care confirm  Expected discharge date: March 04, 2020  Medically stable for discharge:   Barriers to discharge: Inability to obtain appropriate funding for SNF.  Bed available.  Out-of-pocket expenses for private pay $16,000 per month.  Medicaid co-pay days $184 per day.  Patient owns home and is ineligible for long-term Medicaid since stepson lives in home and patient is unwilling to sell this home.  TOC attempting to contact stepson to confirm he will be able to pick patient up from the hospital and take him back home with home health services.  DSS contacted on 2/3 by [facility] team.  Sister has stated she is not willing to become patient's guardian or POA nor is she willing to accept care of this patient after discharge.  Although patient has episodes of being alert and oriented x3 he continues to demonstrate evidence of mild dementia and just by that alone would not be safe at home independently.  Also based on PT evaluations they are recommending 24/7 supervision with SNF placement.  Also not been able to get in touch with patient's son [name] [name] to confirm if he would be available in the home environment to provide 24/7 care.  Likely will need guardianship through DSS-process initiated on our end but we have yet to hear back from APS or DSS regarding progress and potential hearing aid  Code Status: DNR Family Communication: Son [name] [name] 1/07-TOC has made repeated calls to patient's son on 1/12, 1/13 and 1/14 without any response; TOC spoke with patient's sister on 1/13-TOC attempted to contact son again on 2/2 with no success.  Voicemail and text sent. DVT prophylaxis: Lovenox Vaccination status: 1/10 patient  given first dose of Pfizer COVID-vaccine; today on given second dose of COVID vaccine on 2/3  Foley catheter:  No   HPI: 80 year old male with history of recent CVA in 11/2019 with residual dysarthria, hypertension, hyperlipidemia and spinal stenosis, initially presented to [facility] from [facility] SNF with confusion and difficulty breathing. Work-up revealed right-sided loculated pleural effusion with concern for empyema. IR at [facility] attempted ultrasound-guided chest tube placement but were unable due to thickness of pleural fluid. He was evaluated by general surgery at [facility] who recommended transfer to [facility] for thoracic surgery evaluation. Patient was evaluated by thoracic surgery who recommended chest tube placement by IR, that was performed on 01/03/2020. ID consulted and recommended transitioning from IV Zosyn to IV ceftriaxone and oral Flagyl while in-house and discharge on p.o. Augmentin for a total of 6 weeks with start date of 01/03/2020. Right-sided chest tube removed 12/16. TCTS signed off 12/17 and advised no outpatient follow-up needed.  Patient stable for discharge.  PT/OT recommending SNF.  Patient will need long-term Medicaid in place before can discharge.  Subjective: Alert, no acute distress.  Expresses boredom.  Making jokes  Objective: Vitals:   03/11/20 2021 03/12/20 0432  BP: 122/80 (!) 144/80  Pulse: 78 67  Resp: 18 16  Temp: 98.3 F (36.8 C) 98 F (36.7 C)  SpO2: 99% 100%    Intake/Output Summary (Last 24 hours) at 03/12/2020 0867 Last data filed at 03/11/2020 1355 Gross per 24 hour  Intake --  Output 500 ml  Net -500 ml   [employer]  01/03/20 1414  Weight: 66.8 kg    Exam: Constitutional: Awake with no acute distress-appropriately board Respiratory: Lungs clear to auscultation and he is stable on room air Cardiovascular: Heart sounds are normal, pulses regular without resting tachycardia, extremities warm to touch Abdomen: With normoactive  bowel sounds.  Eating well.  LBM 2/9 Neurologic: CN 2-12 grossly intact. Sensation intact,  Strength 5/5 x on left slightly weaker 4/5 on the right Psychiatric: Awake with flat affect.  Oriented to name and place, not to year.  Appears oriented to situation at times.   Assessment/Plan: Acute problems: Acute hypoxemic respiratory failure  2/2 right-sided parapneumonic effusion, empyema  **SEPSIS RULED OUT** -Hypoxemia (resolved)-TCTS consulted -s/p pigtail chest tube on 01/03/2020 and was removed on 12/16. -Follow-up chest x-ray 12/17 without pneumothorax.   -Thoracic surgery signed off 12/17, prn OP follow-up recommended -Initially was treated with IV Zosyn/ Augmentin -completed antibiotics on 02/17/2020.   Physical deconditioning/gait disturbance -PT and OT recommending SNF patient unable to obtain funding to transition to SNF.  -Have been unsuccessful for several weeks and attempting to contact [facility] (who resides in the patient's home) to determine if he will be able to provide 24/7 care therefore be able to accept the patient back into the home  Hypertension -Controlled off antihypertensive medication  Severe protein calorie malnutrition Nutrition Problem: Severe Malnutrition Etiology: chronic illness (CVA) Signs/Symptoms: moderate fat depletion,severe fat depletion,moderate muscle depletion,severe muscle depletion Interventions: Ensure Enlive (each supplement provides 350kcal and 20 grams of protein),Magic cup,MVI -As of 12/23 patient able to feed self and eating meals well (between 50% and 100% of meals over the past 24 hours) Estimated body mass index is 21.13 kg/m as calculated from the following:   Height as of this encounter: 5\' 10"  (1.778 m).   Weight as of this encounter: 66.8 kg.   Wounds: Incision (Closed) 01/03/20 (Active)  Date First Assessed/Time First Assessed: 01/03/20 2000      Assessments 01/03/2020  8:00 PM 02/29/2020  9:30 AM  Dressing Dry;Clean;Intact  Clean;Dry;Intact  Site / Wound Assessment Dressing in place / Unable to assess Clean;Dry  Drainage Description Serosanguineous --     No Linked orders to display     Pressure Injury 02/12/20 Sacrum Mid Stage 2 -  Partial thickness loss of dermis presenting as a shallow open injury with a red, pink wound bed without slough. (Active)  Date First Assessed/Time First Assessed: 02/12/20 1930   Location: Sacrum  Location Orientation: Mid  Staging: Stage 2 -  Partial thickness loss of dermis presenting as a shallow open injury with a red, pink wound bed without slough.    Assessments 02/12/2020  9:23 PM 03/12/2020  7:59 AM  Dressing Type Foam - Lift dressing to assess site every shift Foam - Lift dressing to assess site every shift  Dressing Changed --  Site / Wound Assessment Clean;Dry Clean;Dry  Peri-wound Assessment -- Intact  Drainage Amount None --     No Linked orders to display     Other problems: Acute kidney injury -Resolved -Current creatinine 1.01 with a GFR greater than 60  History of CVA/dementia without behavioral disturbances -Remains normotensive off of BP medication -Continue Plavix and low-dose aspirin -Continue statin/Crestor -Continue Cymbalta  Anemia of chronic disease -Hemoglobin stable around 10   Data Reviewed: Basic Metabolic Panel: No results for input(s): NA, K, CL, CO2, GLUCOSE, BUN, CREATININE, CALCIUM, MG, PHOS in the last 168 hours. Liver Function Tests: No results for input(s): AST, ALT, ALKPHOS, BILITOT, PROT, ALBUMIN  in the last 168 hours. No results for input(s): LIPASE, AMYLASE in the last 168 hours. No results for input(s): AMMONIA in the last 168 hours. CBC: No results for input(s): WBC, NEUTROABS, HGB, HCT, MCV, PLT in the last 168 hours. Cardiac Enzymes: No results for input(s): CKTOTAL, CKMB, CKMBINDEX, TROPONINI in the last 168 hours. BNP (last 3 results) Recent Labs    12/26/19 1510 01/01/20 1018  BNP 88.7 177.1*    ProBNP (last  3 results) No results for input(s): PROBNP in the last 8760 hours.  CBG: No results for input(s): GLUCAP in the last 168 hours.  Recent Results (from the past 240 hour(s))  SARS CORONAVIRUS 2 (TAT 6-24 HRS) Nasopharyngeal Nasopharyngeal Swab     Status: None   Collection Time: 03/03/20 11:41 AM   Specimen: Nasopharyngeal Swab  Result Value Ref Range Status   SARS Coronavirus 2 NEGATIVE NEGATIVE Final    Comment: (NOTE) SARS-CoV-2 target nucleic acids are NOT DETECTED.  The SARS-CoV-2 RNA is generally detectable in upper and lower respiratory specimens during the acute phase of infection. Negative results do not preclude SARS-CoV-2 infection, do not rule out co-infections with other pathogens, and should not be used as the sole basis for treatment or other patient management decisions. Negative results must be combined with clinical observations, patient history, and epidemiological information. The expected result is Negative.  Fact Sheet for Patients: SugarRoll.be  Fact Sheet for Healthcare Providers: https://www.woods-mathews.com/  This test is not yet approved or cleared by the [place] FDA and  has been authorized for detection and/or diagnosis of SARS-CoV-2 by FDA under an Emergency Use Authorization (EUA). This EUA will remain  in effect (meaning this test can be used) for the duration of the COVID-19 declaration under Se ction 564(b)(1) of the Act, 21 U.S.C. section 360bbb-3(b)(1), unless the authorization is terminated or revoked sooner.  Performed at [facility], [place] [address], [place], [place] [address]      Studies: No results found.  Scheduled Meds: . aspirin EC  81 mg Oral Daily  . clopidogrel  75 mg Oral Daily  . DULoxetine  60 mg Oral Daily  . enoxaparin (LOVENOX) injection  40 mg Subcutaneous Daily  . feeding supplement  237 mL Oral TID BM  . multivitamin with minerals  1 tablet Oral Daily  .  rosuvastatin  10 mg Oral Daily   Continuous Infusions:   Active Problems:   Empyema lung (HCC)   Severe sepsis without septic shock ([place])   History of CVA in adulthood   Physical deconditioning   Dementia associated with other underlying disease without behavioral disturbance ([place])   Pressure injury of skin   Consultants:  TCTS  Conventional radiology  Procedures:  Chest tube placement 12/3 by IR  Antibiotics: Anti-infectives (From admission, onward)   Start     Dose/Rate Route Frequency Ordered Stop   02/13/20 0000  amoxicillin-clavulanate (AUGMENTIN) 875-125 MG tablet        1 tablet Oral Every 12 hours 02/13/20 1316 02/14/20 2359   02/05/20 1430  amoxicillin-clavulanate (AUGMENTIN) 875-125 MG per tablet 1 tablet        1 tablet Oral Every 12 hours 02/05/20 1338 02/16/20 2105   01/12/20 1400  metroNIDAZOLE (FLAGYL) tablet 500 mg  Status:  Discontinued        500 mg Oral Every 8 hours 01/12/20 1111 02/05/20 1337   01/12/20 1300  cefTRIAXone (ROCEPHIN) 2 g in sodium chloride 0.9 % 100 mL IVPB  Status:  Discontinued        2 g 200 mL/hr over 30 Minutes Intravenous Every 24 hours 01/12/20 1111 02/05/20 1337   01/02/20 2330  piperacillin-tazobactam (ZOSYN) IVPB 3.375 g  Status:  Discontinued        3.375 g 12.5 mL/hr over 240 Minutes Intravenous Every 8 hours 01/02/20 2242 01/12/20 1111       Time spent: 20 minutes    Erin Hearing ANP  [facility] 7 am- 330 pm/M-F for direct patient care and secure chat Please refer to Amion for contact information 70 days

## 2020-03-12 NOTE — Progress Notes (Addendum)
1:50pm: CSW spoke with Chanel at [facility] who states [place] [facility] is planning to pursue guardianship of this patient - Chanel to update CSW as new information becomes available.  11am: CSW spoke with [facility] supervisor regarding inadequate support from APS regarding this patient - CSW will send correspondce to [facility] Director and APS Director to request additional help with discharge planning.  9am: CSW received response from Chanel at [place] who states she has been unsuccessful in contact the patient's step son as well.   8am: CSW attempted to reach patient's stepson [name] - no answer, voicemail reveals the number does not belong to [place], but a business instead.  CSW will contact [facility] [place] to discuss this patient further.  Madilyn Fireman, MSW, LCSW Transitions of Care  Clinical Social Worker II [phone]

## 2020-03-13 NOTE — Progress Notes (Addendum)
CSW sent patient's information to [place] for review.  CSW was notified of guardianship court date for March 8th, 2022 in [place]. CSW sent FL2 and incompetency letter to [place] at [place].  Madilyn Fireman, MSW, LCSW Transitions of Care  Clinical Social Worker II [phone]

## 2020-03-13 NOTE — Progress Notes (Addendum)
[facility] PROGRESS NOTE  Stacie Knutzen KXF:[id number] DOB: 1940-06-16 DOA: 01/02/2020 PCP: [name], [name], MD  Status: Remains inpatient appropriate because:Unsafe d/c plan   Dispo:  Patient From:  Private residence  Planned Disposition:  SNF vs private residence if 24/7 care confirm  Expected discharge date: March 04, 2020  Medically stable for discharge:   Barriers to discharge: Inability to obtain appropriate funding for SNF.  Bed available.  Out-of-pocket expenses for private pay $16,000 per month.  Medicaid co-pay days $184 per day.  Patient owns home and is ineligible for long-term Medicaid since stepson lives in home and patient is unwilling to sell this home.  TOC attempting to contact stepson to confirm he will be able to pick patient up from the hospital and take him back home with home health services.  DSS contacted on 2/3 by [facility] team.  Sister has stated she is not willing to become patient's guardian or POA nor is she willing to accept care of this patient after discharge.  Although patient has episodes of being alert and oriented x3 he continues to demonstrate evidence of mild dementia and just by that alone would not be safe at home independently.  Also based on PT evaluations they are recommending 24/7 supervision with SNF placement.  Also not been able to get in touch with patient's son [name] to confirm if he would be available in the home environment to provide 24/7 care.  DSS pursuing guardianship and case posted to the court docket and [facility]  Code Status: DNR Family Communication: Son [name] 1/07-TOC has made repeated calls to patient's son on 1/12, 1/13 and 1/14 without any response; TOC spoke with patient's sister on 1/13-TOC attempted to contact son again on 2/2 with no success.  Voicemail and text sent. DVT prophylaxis: Lovenox Vaccination status: 1/10 patient given first dose of Pfizer COVID-vaccine; today on given second dose of COVID  vaccine on 2/3  Foley catheter:  No   HPI: 80 year old male with history of recent CVA in 11/2019 with residual dysarthria, hypertension, hyperlipidemia and spinal stenosis, initially presented to [facility] from [facility] SNF with confusion and difficulty breathing. Work-up revealed right-sided loculated pleural effusion with concern for empyema. IR at [facility] attempted ultrasound-guided chest tube placement but were unable due to thickness of pleural fluid. He was evaluated by general surgery at [facility] who recommended transfer to [facility] for thoracic surgery evaluation. Patient was evaluated by thoracic surgery who recommended chest tube placement by IR, that was performed on 01/03/2020. ID consulted and recommended transitioning from IV Zosyn to IV ceftriaxone and oral Flagyl while in-house and discharge on p.o. Augmentin for a total of 6 weeks with start date of 01/03/2020. Right-sided chest tube removed 12/16. TCTS signed off 12/17 and advised no outpatient follow-up needed.  Patient stable for discharge.  PT/OT recommending SNF.  Patient will need long-term Medicaid in place before can discharge.  Subjective: Alert, sitting in chair.  Appears slightly more depressed today.  No specific complaints  Objective: Vitals:   03/12/20 1306 03/13/20 0646  BP: 124/79 122/77  Pulse: 85 80  Resp: 16 17  Temp: 97.9 F (36.6 C) 98 F (36.7 C)  SpO2: 98% 99%    Intake/Output Summary (Last 24 hours) at 03/13/2020 0836 Last data filed at 03/13/2020 0647 Gross per 24 hour  Intake 577 ml  Output 1750 ml  Net -1173 ml   Filed Weights   01/03/20 1414  Weight: 66.8 kg    Exam:  Constitutional: Awake, pleasant, no acute distress Respiratory: Clear, stable on room air Cardiovascular: Normal heart sounds, pulse regular nontachycardic, extremities warm without peripheral edema Abdomen: Abdomen soft, bowel sounds present.  Tolerating diet and able to feed self.  LBM 2/9 Neurologic: CN 2-12  grossly intact. Sensation intact,  Strength 4/5 x on left slightly weaker 4/5 on the right Psychiatric: Awake with flat affect.  Oriented to name and place, not to year.  Appears oriented to situation at times.   Assessment/Plan: Acute problems: Acute hypoxemic respiratory failure  2/2 right-sided parapneumonic effusion, empyema  **SEPSIS RULED OUT** -Hypoxemia (resolved)-TCTS consulted -s/p pigtail chest tube on 01/03/2020 and was removed on 12/16. -Follow-up chest x-ray 12/17 without pneumothorax.   -Thoracic surgery signed off 12/17, prn OP follow-up recommended -Initially was treated with IV Zosyn/ Augmentin -completed antibiotics on 02/17/2020.   Physical deconditioning/gait disturbance -PT and OT recommending SNF patient unable to obtain funding to transition to SNF.  -Have been unsuccessful for several weeks and attempting to contact [place] (who resides in the patient's home) to determine if he will be able to provide 24/7 care therefore be able to accept the patient back into the home  History of CVA/dementia without behavioral disturbances -Remains normotensive off of BP medication -Continue Plavix and low-dose aspirin -Continue statin/Crestor -Continue Cymbalta -On 2/10 PT came to room and noted patient was incontinent of bowel and bladder and had no awareness of this.  Hypertension -Controlled off antihypertensive medication  Severe protein calorie malnutrition Nutrition Problem: Severe Malnutrition Etiology: chronic illness (CVA) Signs/Symptoms: moderate fat depletion,severe fat depletion,moderate muscle depletion,severe muscle depletion Interventions: Ensure Enlive (each supplement provides 350kcal and 20 grams of protein),Magic cup,MVI -As of 12/23 patient able to feed self and eating meals well (between 50% and 100% of meals over the past 24 hours) Estimated body mass index is 21.13 kg/m as calculated from the following:   Height as of this encounter: 5\' 10"   (1.778 m).   Weight as of this encounter: 66.8 kg.   Wounds: Incision (Closed) 01/03/20 (Active)  Date First Assessed/Time First Assessed: 01/03/20 2000      Assessments 01/03/2020  8:00 PM 02/29/2020  9:30 AM  Dressing Dry;Clean;Intact Clean;Dry;Intact  Site / Wound Assessment Dressing in place / Unable to assess Clean;Dry  Drainage Description Serosanguineous --     No Linked orders to display     Pressure Injury 02/12/20 Sacrum Mid Stage 2 -  Partial thickness loss of dermis presenting as a shallow open injury with a red, pink wound bed without slough. (Active)  Date First Assessed/Time First Assessed: 02/12/20 1930   Location: Sacrum  Location Orientation: Mid  Staging: Stage 2 -  Partial thickness loss of dermis presenting as a shallow open injury with a red, pink wound bed without slough.    Assessments 02/12/2020  9:23 PM 03/12/2020  7:59 AM  Dressing Type Foam - Lift dressing to assess site every shift Foam - Lift dressing to assess site every shift  Dressing Changed --  Site / Wound Assessment Clean;Dry Clean;Dry  Peri-wound Assessment -- Intact  Drainage Amount None --     No Linked orders to display     Other problems: Acute kidney injury -Resolved -Current creatinine 1.01 with a GFR greater than 60  History of CVA/dementia without behavioral disturbances -Remains normotensive off of BP medication -Continue Plavix and low-dose aspirin -Continue statin/Crestor -Continue Cymbalta  Anemia of chronic disease -Hemoglobin stable around 10   Data Reviewed: Basic Metabolic Panel: No results  for input(s): NA, K, CL, CO2, GLUCOSE, BUN, CREATININE, CALCIUM, MG, PHOS in the last 168 hours. Liver Function Tests: No results for input(s): AST, ALT, ALKPHOS, BILITOT, PROT, ALBUMIN in the last 168 hours. No results for input(s): LIPASE, AMYLASE in the last 168 hours. No results for input(s): AMMONIA in the last 168 hours. CBC: No results for input(s): WBC, NEUTROABS, HGB,  HCT, MCV, PLT in the last 168 hours. Cardiac Enzymes: No results for input(s): CKTOTAL, CKMB, CKMBINDEX, TROPONINI in the last 168 hours. BNP (last 3 results) Recent Labs    12/26/19 1510 01/01/20 1018  BNP 88.7 177.1*    ProBNP (last 3 results) No results for input(s): PROBNP in the last 8760 hours.  CBG: No results for input(s): GLUCAP in the last 168 hours.  Recent Results (from the past 240 hour(s))  SARS CORONAVIRUS 2 (TAT 6-24 HRS) Nasopharyngeal Nasopharyngeal Swab     Status: None   Collection Time: 03/03/20 11:41 AM   Specimen: Nasopharyngeal Swab  Result Value Ref Range Status   SARS Coronavirus 2 NEGATIVE NEGATIVE Final    Comment: (NOTE) SARS-CoV-2 target nucleic acids are NOT DETECTED.  The SARS-CoV-2 RNA is generally detectable in upper and lower respiratory specimens during the acute phase of infection. Negative results do not preclude SARS-CoV-2 infection, do not rule out co-infections with other pathogens, and should not be used as the sole basis for treatment or other patient management decisions. Negative results must be combined with clinical observations, patient history, and epidemiological information. The expected result is Negative.  Fact Sheet for Patients: SugarRoll.be  Fact Sheet for Healthcare Providers: https://www.woods-mathews.com/  This test is not yet approved or cleared by the [place] FDA and  has been authorized for detection and/or diagnosis of SARS-CoV-2 by FDA under an Emergency Use Authorization (EUA). This EUA will remain  in effect (meaning this test can be used) for the duration of the COVID-19 declaration under Se ction 564(b)(1) of the Act, 21 U.S.C. section 360bbb-3(b)(1), unless the authorization is terminated or revoked sooner.  Performed at [facility], [place] [address] [place], [place] [address]      Studies: No results found.  Scheduled Meds: . aspirin EC   81 mg Oral Daily  . clopidogrel  75 mg Oral Daily  . DULoxetine  60 mg Oral Daily  . enoxaparin (LOVENOX) injection  40 mg Subcutaneous Daily  . feeding supplement  237 mL Oral TID BM  . multivitamin with minerals  1 tablet Oral Daily  . rosuvastatin  10 mg Oral Daily   Continuous Infusions:   Active Problems:   Empyema lung (HCC)   Severe sepsis without septic shock ([place])   History of CVA in adulthood   Physical deconditioning   Dementia associated with other underlying disease without behavioral disturbance ([place])   Pressure injury of skin   Consultants:  TCTS  Conventional radiology  Procedures:  Chest tube placement 12/3 by IR  Antibiotics: Anti-infectives (From admission, onward)   Start     Dose/Rate Route Frequency Ordered Stop   02/13/20 0000  amoxicillin-clavulanate (AUGMENTIN) 875-125 MG tablet        1 tablet Oral Every 12 hours 02/13/20 1316 02/14/20 2359   02/05/20 1430  amoxicillin-clavulanate (AUGMENTIN) 875-125 MG per tablet 1 tablet        1 tablet Oral Every 12 hours 02/05/20 1338 02/16/20 2105   01/12/20 1400  metroNIDAZOLE (FLAGYL) tablet 500 mg  Status:  Discontinued  500 mg Oral Every 8 hours 01/12/20 1111 02/05/20 1337   01/12/20 1300  cefTRIAXone (ROCEPHIN) 2 g in sodium chloride 0.9 % 100 mL IVPB  Status:  Discontinued        2 g 200 mL/hr over 30 Minutes Intravenous Every 24 hours 01/12/20 1111 02/05/20 1337   01/02/20 2330  piperacillin-tazobactam (ZOSYN) IVPB 3.375 g  Status:  Discontinued        3.375 g 12.5 mL/hr over 240 Minutes Intravenous Every 8 hours 01/02/20 2242 01/12/20 1111       Time spent: 20 minutes    Erin Hearing ANP  [facility] 7 am- 330 pm/M-F for direct patient care and secure chat Please refer to Amion for contact information 71 days

## 2020-03-14 NOTE — Progress Notes (Signed)
[facility] PROGRESS NOTE  [name] [name] YSA:[id number] DOB: 10-03-1940 DOA: 01/02/2020 PCP: [name], [name], MD  Status: Remains inpatient appropriate because:Unsafe d/c plan   Dispo:  Patient From:  Private residence  Planned Disposition:  SNF vs private residence if 24/7 care confirm  Expected discharge date: March 04, 2020  Medically stable for discharge:   Barriers to discharge: Inability to obtain appropriate funding for SNF.  Bed available.  Out-of-pocket expenses for private pay $16,000 per month.  Medicaid co-pay days $184 per day.  Patient owns home and is ineligible for long-term Medicaid since stepson lives in home and patient is unwilling to sell this home.  TOC attempting to contact stepson to confirm he will be able to pick patient up from the hospital and take him back home with home health services.  DSS contacted on 2/3 by [facility] team.  Sister has stated she is not willing to become patient's guardian or POA nor is she willing to accept care of this patient after discharge.  Although patient has episodes of being alert and oriented x3 he continues to demonstrate evidence of mild dementia and just by that alone would not be safe at home independently.  Also based on PT evaluations they are recommending 24/7 supervision with SNF placement.  Also not been able to get in touch with patient's son [name] [name] to confirm if he would be available in the home environment to provide 24/7 care.  DSS pursuing guardianship and case posted to the court docket and [facility]   Code Status: DNR Family Communication: Son [name] [name] 1/07-TOC has made repeated calls to patient's son on 1/12, 1/13 and 1/14 without any response; TOC spoke with patient's sister on 1/13-TOC attempted to contact son again on 2/2 with no success.  Voicemail and text sent. DVT prophylaxis: Lovenox Vaccination status: 1/10 patient given first dose of Pfizer COVID-vaccine; today on given second dose of COVID  vaccine on 2/3  Foley catheter:  No   HPI: 80 year old male with history of recent CVA in 11/2019 with residual dysarthria, hypertension, hyperlipidemia and spinal stenosis, initially presented to [facility] from [facility] SNF with confusion and difficulty breathing. Work-up revealed right-sided loculated pleural effusion with concern for empyema. IR at [facility] attempted ultrasound-guided chest tube placement but were unable due to thickness of pleural fluid. He was evaluated by general surgery at [facility] who recommended transfer to [facility] for thoracic surgery evaluation. Patient was evaluated by thoracic surgery who recommended chest tube placement by IR, that was performed on 01/03/2020. ID consulted and recommended transitioning from IV Zosyn to IV ceftriaxone and oral Flagyl while in-house and discharge on p.o. Augmentin for a total of 6 weeks with start date of 01/03/2020. Right-sided chest tube removed 12/16. TCTS signed off 12/17 and advised no outpatient follow-up needed.  Patient stable for discharge.  PT/OT recommending SNF.  Patient will need long-term Medicaid in place before can discharge.  Subjective: The patient was seen and examined, remained stable no issues overnight  Objective: Vitals:   03/13/20 2048 03/14/20 0338  BP: 127/72 (!) 152/86  Pulse: 74 62  Resp: 16 16  Temp: 98.7 F (37.1 C) 97.6 F (36.4 C)  SpO2: 98% 100%    Intake/Output Summary (Last 24 hours) at 03/14/2020 0826 Last data filed at 03/14/2020 0759 Gross per 24 hour  Intake 780 ml  Output 2750 ml  Net -1970 ml   Filed Weights   01/03/20 1414  Weight: 66.8 kg  Physical Exam:   General:  Alert, oriented X2 ....  cooperative, no distress; flat affect  HEENT:  Normocephalic, PERRL, otherwise with in Normal limits   Neuro:  CNII-XII intact. , normal motor and sensation, reflexes intact   Lungs:   Clear to auscultation BL, Respirations unlabored, no wheezes / crackles  Cardio:    S1/S2,  RRR, No murmure, No Rubs or Gallops   Abdomen:   Soft, non-tender, bowel sounds active all four quadrants,  no guarding or peritoneal signs.  Muscular skeletal:   Severe generalized weaknesses, Limited exam - in bed, able to move all 4 extremities, Normal strength,  2+ pulses,  symmetric, No pitting edema  Skin:  Dry, warm to touch, negative for any Rashes,  Wounds: Please see nursing documentation Pressure Injury 02/12/20 Sacrum Mid Stage 2 -  Partial thickness loss of dermis presenting as a shallow open injury with a red, pink wound bed without slough. (Active)  02/12/20 1930  Location: Sacrum  Location Orientation: Mid  Staging: Stage 2 -  Partial thickness loss of dermis presenting as a shallow open injury with a red, pink wound bed without slough.  Wound Description (Comments):   Present on Admission:           Assessment/Plan: Acute problems: Acute hypoxemic respiratory failure  2/2 right-sided parapneumonic effusion, empyema  **SEPSIS RULED OUT** -Hypoxemia (resolved)-TCTS consulted -s/p pigtail chest tube on 01/03/2020 and was removed on 12/16. -Follow-up chest x-ray 12/17 without pneumothorax.   -Thoracic surgery signed off 12/17, prn OP follow-up recommended -Initially was treated with IV Zosyn/ Augmentin -completed antibiotics on 02/17/2020.  -Remained stable   Physical deconditioning/gait disturbance -PT and OT recommending SNF patient unable to obtain funding to transition to SNF.  -Have been unsuccessful for several weeks and attempting to contact [name] [name] (who resides in the patient's home) to determine if he will be able to provide 24/7 care therefore be able to accept the patient back into the home -No changes .[name] Advised the patient and the nursing staff to ambulate with assist  History of CVA/dementia without behavioral disturbances -Remains normotensive off of BP medication -Continue Plavix and low-dose aspirin -Continue statin/Crestor -Continue  Cymbalta -On 2/10 PT came to room and noted patient was incontinent of bowel and bladder and had no awareness of this. -Medications reviewed, remained stable, no changes today  Hypertension -Controlled off antihypertensive medication -Mildly elevated blood pressure 152/86 today otherwise stable  Severe protein calorie malnutrition Nutrition Problem: Severe Malnutrition Etiology: chronic illness (CVA) Signs/Symptoms: moderate fat depletion,severe fat depletion,moderate muscle depletion,severe muscle depletion Interventions: Ensure Enlive (each supplement provides 350kcal and 20 grams of protein),Magic cup,MVI -As of 12/23 patient able to feed self and eating meals well (between 50% and 100% of meals over the past 24 hours) Estimated body mass index is 21.13 kg/m as calculated from the following:   Height as of this encounter: 5\' 10"  (1.778 m).   Weight as of this encounter: 66.8 kg.   Wounds: Incision (Closed) 01/03/20 (Active)  Date First Assessed/Time First Assessed: 01/03/20 2000      Assessments 01/03/2020  8:00 PM 02/29/2020  9:30 AM  Dressing Dry;Clean;Intact Clean;Dry;Intact  Site / Wound Assessment Dressing in place / Unable to assess Clean;Dry  Drainage Description Serosanguineous --     No Linked orders to display     Pressure Injury 02/12/20 Sacrum Mid Stage 2 -  Partial thickness loss of dermis presenting as a shallow open injury with a red, pink wound bed without slough. (  Active)  Date First Assessed/Time First Assessed: 02/12/20 1930   Location: Sacrum  Location Orientation: Mid  Staging: Stage 2 -  Partial thickness loss of dermis presenting as a shallow open injury with a red, pink wound bed without slough.    Assessments 02/12/2020  9:23 PM 03/12/2020  7:59 AM  Dressing Type Foam - Lift dressing to assess site every shift Foam - Lift dressing to assess site every shift  Dressing Changed --  Site / Wound Assessment Clean;Dry Clean;Dry  Peri-wound Assessment -- Intact   Drainage Amount None --     No Linked orders to display     Other problems: Acute kidney injury -Resolved -Current creatinine 1.01 with a GFR greater than 60  History of CVA/dementia without behavioral disturbances -Remains normotensive off of BP medication -Continue Plavix and low-dose aspirin -Continue statin/Crestor -Continue Cymbalta  Anemia of chronic disease -Hemoglobin stable around 10   Data Reviewed: Basic Metabolic Panel: No results for input(s): NA, K, CL, CO2, GLUCOSE, BUN, CREATININE, CALCIUM, MG, PHOS in the last 168 hours. Liver Function Tests: No results for input(s): AST, ALT, ALKPHOS, BILITOT, PROT, ALBUMIN in the last 168 hours. No results for input(s): LIPASE, AMYLASE in the last 168 hours. No results for input(s): AMMONIA in the last 168 hours. CBC: No results for input(s): WBC, NEUTROABS, HGB, HCT, MCV, PLT in the last 168 hours. Cardiac Enzymes: No results for input(s): CKTOTAL, CKMB, CKMBINDEX, TROPONINI in the last 168 hours. BNP (last 3 results) Recent Labs    12/26/19 1510 01/01/20 1018  BNP 88.7 177.1*    ProBNP (last 3 results) No results for input(s): PROBNP in the last 8760 hours.  CBG: No results for input(s): GLUCAP in the last 168 hours.  No results found for this or any previous visit (from the past 240 hour(s)).   Studies: No results found.  Scheduled Meds: . aspirin EC  81 mg Oral Daily  . clopidogrel  75 mg Oral Daily  . DULoxetine  60 mg Oral Daily  . enoxaparin (LOVENOX) injection  40 mg Subcutaneous Daily  . feeding supplement  237 mL Oral TID BM  . multivitamin with minerals  1 tablet Oral Daily  . rosuvastatin  10 mg Oral Daily   Continuous Infusions:   Active Problems:   Empyema lung (HCC)   Severe sepsis without septic shock ([place])   History of CVA in adulthood   Physical deconditioning   Dementia associated with other underlying disease without behavioral disturbance ([place])   Pressure injury of  skin   Consultants:  TCTS  Conventional radiology  Procedures:  Chest tube placement 12/3 by IR  Antibiotics: Anti-infectives (From admission, onward)   Start     Dose/Rate Route Frequency Ordered Stop   02/13/20 0000  amoxicillin-clavulanate (AUGMENTIN) 875-125 MG tablet        1 tablet Oral Every 12 hours 02/13/20 1316 02/14/20 2359   02/05/20 1430  amoxicillin-clavulanate (AUGMENTIN) 875-125 MG per tablet 1 tablet        1 tablet Oral Every 12 hours 02/05/20 1338 02/16/20 2105   01/12/20 1400  metroNIDAZOLE (FLAGYL) tablet 500 mg  Status:  Discontinued        500 mg Oral Every 8 hours 01/12/20 1111 02/05/20 1337   01/12/20 1300  cefTRIAXone (ROCEPHIN) 2 g in sodium chloride 0.9 % 100 mL IVPB  Status:  Discontinued        2 g 200 mL/hr over 30 Minutes Intravenous Every 24 hours 01/12/20 1111 02/05/20  1337   01/02/20 2330  piperacillin-tazobactam (ZOSYN) IVPB 3.375 g  Status:  Discontinued        3.375 g 12.5 mL/hr over 240 Minutes Intravenous Every 8 hours 01/02/20 2242 01/12/20 1111       Time spent: 20 minutes    Aalyiah Camberos A Elonzo Sopp ANP  [facility] 7 am- 330 pm/M-F for direct patient care and secure chat Please refer to Amion for contact information 72 days

## 2020-03-14 NOTE — Plan of Care (Signed)
  Problem: Education: Goal: Knowledge of General Education information will improve Description: Including pain rating scale, medication(s)/side effects and non-pharmacologic comfort measures Outcome: Progressing   Problem: Clinical Measurements: Goal: Ability to maintain clinical measurements within normal limits will improve Outcome: Progressing Goal: Will remain free from infection Outcome: Progressing   Problem: Clinical Measurements: Goal: Ability to maintain clinical measurements within normal limits will improve Outcome: Progressing   Problem: Nutrition: Goal: Adequate nutrition will be maintained Outcome: Progressing   Problem: Elimination: Goal: Will not experience complications related to bowel motility Outcome: Progressing Goal: Will not experience complications related to urinary retention Outcome: Progressing   Problem: Pain Managment: Goal: General experience of comfort will improve Outcome: Progressing   Problem: Safety: Goal: Ability to remain free from injury will improve Outcome: Progressing   Problem: Skin Integrity: Goal: Risk for impaired skin integrity will decrease Outcome: Progressing

## 2020-03-15 NOTE — Progress Notes (Signed)
[facility] PROGRESS NOTE  [name] WUX:[id number] DOB: 12-01-1940 DOA: 01/02/2020 PCP: [name], [name], MD  Status: Remains inpatient appropriate because:Unsafe d/c plan   Dispo:  Patient From:  Private residence  Planned Disposition:  SNF vs private residence if 24/7 care confirm  Expected discharge date: March 04, 2020  Medically stable for discharge:   Barriers to discharge: Inability to obtain appropriate funding for SNF.  Bed available.  Out-of-pocket expenses for private pay $16,000 per month.  Medicaid co-pay days $184 per day.  Patient owns home and is ineligible for long-term Medicaid since stepson lives in home and patient is unwilling to sell this home.  TOC attempting to contact stepson to confirm he will be able to pick patient up from the hospital and take him back home with home health services.  DSS contacted on 2/3 by [facility] team.  Sister has stated she is not willing to become patient's guardian or POA nor is she willing to accept care of this patient after discharge.  Although patient has episodes of being alert and oriented x3 he continues to demonstrate evidence of mild dementia and just by that alone would not be safe at home independently.  Also based on PT evaluations they are recommending 24/7 supervision with SNF placement.  Also not been able to get in touch with patient's son [name] to confirm if he would be available in the home environment to provide 24/7 care.  DSS pursuing guardianship and case posted to the court docket and [facility]   Code Status: DNR Family Communication: Son [name] 1/07-TOC has made repeated calls to patient's son on 1/12, 1/13 and 1/14 without any response; TOC spoke with patient's sister on 1/13-TOC attempted to contact son again on 2/2 with no success.  Voicemail and text sent. DVT prophylaxis: Lovenox Vaccination status: 1/10 patient given first dose of Pfizer COVID-vaccine; today on given second dose of COVID  vaccine on 2/3  Foley catheter:  No   HPI: 80 year old male with history of recent CVA in 11/2019 with residual dysarthria, hypertension, hyperlipidemia and spinal stenosis, initially presented to [facility] from [facility] SNF with confusion and difficulty breathing. Work-up revealed right-sided loculated pleural effusion with concern for empyema. IR at [facility] attempted ultrasound-guided chest tube placement but were unable due to thickness of pleural fluid. He was evaluated by general surgery at [facility] who recommended transfer to [facility] for thoracic surgery evaluation. Patient was evaluated by thoracic surgery who recommended chest tube placement by IR, that was performed on 01/03/2020. ID consulted and recommended transitioning from IV Zosyn to IV ceftriaxone and oral Flagyl while in-house and discharge on p.o. Augmentin for a total of 6 weeks with start date of 01/03/2020. Right-sided chest tube removed 12/16. TCTS signed off 12/17 and advised no outpatient follow-up needed.  Patient stable for discharge.  PT/OT recommending SNF.  Patient will need long-term Medicaid in place before can discharge.  Subjective:  The patient was seen and examined this morning, stable no acute distress No issues overnight    Objective: Vitals:   03/14/20 2107 03/15/20 0403  BP: (!) 143/79 139/74  Pulse: 65 60  Resp: 16 16  Temp: 97.8 F (36.6 C) (!) 97.5 F (36.4 C)  SpO2: 100% 97%    Intake/Output Summary (Last 24 hours) at 03/15/2020 1036 Last data filed at 03/15/2020 0439 Gross per 24 hour  Intake 340 ml  Output 600 ml  Net -260 ml   Filed Weights   01/03/20 1414  Weight: 66.8 kg       Physical Exam:   General:  Alert, oriented, cooperative, no distress;   HEENT:  Normocephalic, PERRL, otherwise with in Normal limits   Neuro:  CNII-XII intact. , normal motor and sensation, reflexes intact   Lungs:   Clear to auscultation BL, Respirations unlabored, no wheezes / crackles  Cardio:     S1/S2, RRR, No murmure, No Rubs or Gallops   Abdomen:   Soft, non-tender, bowel sounds active all four quadrants,  no guarding or peritoneal signs.  Muscular skeletal:   Severe generalized weaknesses, Limited exam - in bed, able to move all 4 extremities, Normal strength,  2+ pulses,  symmetric, No pitting edema  Skin:  Dry, warm to touch, negative for any Rashes,  Wounds: Please see nursing documentation Pressure Injury 02/12/20 Sacrum Mid Stage 2 -  Partial thickness loss of dermis presenting as a shallow open injury with a red, pink wound bed without slough. (Active)  02/12/20 1930  Location: Sacrum  Location Orientation: Mid  Staging: Stage 2 -  Partial thickness loss of dermis presenting as a shallow open injury with a red, pink wound bed without slough.  Wound Description (Comments):   Present on Admission:           Assessment/Plan: Acute problems: Acute hypoxemic respiratory failure  2/2 right-sided parapneumonic effusion, empyema  **SEPSIS RULED OUT** -Hypoxemia (resolved)-TCTS consulted -s/p pigtail chest tube on 01/03/2020 and was removed on 12/16. -Follow-up chest x-ray 12/17 without pneumothorax.   -Thoracic surgery signed off 12/17, prn OP follow-up recommended -Initially was treated with IV Zosyn/ Augmentin -completed antibiotics on 02/17/2020.  -Hemodynamically stable, no changes  Physical deconditioning/gait disturbance -PT and OT recommending SNF patient unable to obtain funding to transition to SNF.  -Have been unsuccessful for several weeks and attempting to contact [employer] (who resides in the patient's home) to determine if he will be able to provide 24/7 care therefore be able to accept the patient back into the home -Remained stable encouraging ambulation and PT today t  History of CVA/dementia without behavioral disturbances -Remains normotensive off of BP medication -Continue Plavix and low-dose aspirin -Continue statin/Crestor -Continue  Cymbalta -On 2/10 PT came to room and noted patient was incontinent of bowel and bladder and had no awareness of this. -Medications reviewed, no changes stable  Hypertension -Controlled off antihypertensive medication -Improved blood pressure, remained stable  Severe protein calorie malnutrition Nutrition Problem: Severe Malnutrition Etiology: chronic illness (CVA) Signs/Symptoms: moderate fat depletion,severe fat depletion,moderate muscle depletion,severe muscle depletion Interventions: Ensure Enlive (each supplement provides 350kcal and 20 grams of protein),Magic cup,MVI -As of 12/23 patient able to feed self and eating meals well (between 50% and 100% of meals over the past 24 hours) Estimated body mass index is 21.13 kg/m as calculated from the following:   Height as of this encounter: 5\' 10"  (1.778 m).   Weight as of this encounter: 66.8 kg.   -Per nursing staff tolerating p.o.--- improving  Wounds: Incision (Closed) 01/03/20 (Active)  Date First Assessed/Time First Assessed: 01/03/20 2000      Assessments 01/03/2020  8:00 PM 02/29/2020  9:30 AM  Dressing Dry;Clean;Intact Clean;Dry;Intact  Site / Wound Assessment Dressing in place / Unable to assess Clean;Dry  Drainage Description Serosanguineous -     No Linked orders to display     Pressure Injury 02/12/20 Sacrum Mid Stage 2 -  Partial thickness loss of dermis presenting as a shallow open injury with a red, pink wound bed  without slough. (Active)  Date First Assessed/Time First Assessed: 02/12/20 1930   Location: Sacrum  Location Orientation: Mid  Staging: Stage 2 -  Partial thickness loss of dermis presenting as a shallow open injury with a red, pink wound bed without slough.    Assessments 02/12/2020  9:23 PM 03/14/2020  8:10 PM  Dressing Type Foam - Lift dressing to assess site every shift None  Dressing Changed -  Site / Wound Assessment Clean;Dry Clean;Dry  Drainage Amount None -     No Linked orders to display      Other problems: Acute kidney injury -Resolved -Current creatinine 1.01 with a GFR greater than 60  History of CVA/dementia without behavioral disturbances -Continue Plavix and low-dose aspirin -Continue statin/Crestor -Continue Cymbalta -Remained stable  Anemia of chronic disease -Hemoglobin stable around 10   Recent Labs    12/26/19 1510 01/01/20 1018  BNP 88.7 177.1*   Studies: No results found.  Scheduled Meds: . aspirin EC  81 mg Oral Daily  . clopidogrel  75 mg Oral Daily  . DULoxetine  60 mg Oral Daily  . enoxaparin (LOVENOX) injection  40 mg Subcutaneous Daily  . feeding supplement  237 mL Oral TID BM  . multivitamin with minerals  1 tablet Oral Daily  . rosuvastatin  10 mg Oral Daily   Continuous Infusions:   Active Problems:   Empyema lung (HCC)   Severe sepsis without septic shock ([place])   History of CVA in adulthood   Physical deconditioning   Dementia associated with other underlying disease without behavioral disturbance ([place])   Pressure injury of skin   Consultants:  TCTS  Conventional radiology  Procedures:  Chest tube placement 12/3 by IR  Antibiotics: Reviewed completed course of IV antibiotics Time spent: 20 minutes    Seyed A Shahmehdi ANP  [facility] 7 am- 330 pm/M-F for direct patient care and secure chat Please refer to Amion for contact information 73 days

## 2020-03-15 NOTE — Progress Notes (Signed)
Pt doing well this am, ate a great bfast and bed in lowest position and bed alarm on.Pt reports no pain at present. He talked with his sister, [name] this am.

## 2020-03-16 LAB — MRSA PCR SCREENING: MRSA by PCR: NEGATIVE

## 2020-03-16 NOTE — Progress Notes (Addendum)
Patient will discharge to [facility] on Thursday, 03/19/20.   Nathaniel Man, [facility] Director will sign the patient into the facility.  A new COVID test has been ordered for placement.  Madilyn Fireman, MSW, LCSW Transitions of Care  Clinical Social Worker II [phone]

## 2020-03-16 NOTE — Progress Notes (Signed)
[name] PROGRESS NOTE  [name] [name] [id number]:[id number] DOB: [birth year] DOA: 01/02/2020 PCP: [name], [name], MD  Status: Remains inpatient appropriate because:Unsafe d/c plan   Dispo:  Patient From:  Private residence  Planned Disposition:  SNF vs private residence if 24/7 care confirm  Expected discharge date: March 04, 2020  Medically stable for discharge:   Barriers to discharge: Inability to obtain appropriate funding for SNF.  Bed available.  Out-of-pocket expenses for private pay $16,000 per month.  Medicaid co-pay days $184 per day.  Patient owns home and is ineligible for long-term Medicaid since stepson lives in home and patient is unwilling to sell this home.  TOC attempting to contact stepson to confirm he will be able to pick patient up from [name] and take him back home with home health services.  [name] [name] contacted on 2/3 by [name] team.  Sister has stated she is not willing to become patient's guardian or POA nor is she willing to accept care of this patient after discharge.  Although patient has episodes of being alert and oriented x3 he continues to demonstrate evidence of mild dementia and just by that alone would not be safe at home independently.  Also based on PT evaluations they are recommending 24/7 supervision with SNF placement.  Also not been able to get in touch with patient's son [name] [name] to confirm if he would be available in [name] home environment to provide 24/7 care.  [name] pursuing guardianship and case posted to [name] court docket and [name]  Code Status: DNR Family Communication: Son [name] [name] 1/07-TOC has made repeated calls to patient's son on 1/12, 1/13 and 1/14 without any response; TOC spoke with patient's sister on 1/13-TOC attempted to contact son again on 2/2 with no success.  Voicemail and text sent. DVT prophylaxis: Lovenox Vaccination status: 1/10 patient given first dose of Pfizer COVID-vaccine; today on given second dose of COVID  vaccine on 2/3  Foley catheter:  No   HPI: 80 year old male with history of recent CVA in 11/2019 with residual dysarthria, hypertension, hyperlipidemia and spinal stenosis, [name] with confusion and difficulty breathing. Work-up revealed right-sided loculated pleural effusion with concern for empyema. IR at [name] attempted ultrasound-guided chest tube placement but were unable due to thickness of pleural fluid. He was evaluated by general surgery at [name] who recommended transfer to [name] for thoracic surgery evaluation. Patient was evaluated by thoracic surgery who recommended chest tube placement by IR, that was performed on 01/03/2020. ID consulted and recommended transitioning from IV Zosyn to IV ceftriaxone and oral Flagyl while in-house and discharge on p.o. Augmentin for a total of 6 weeks with start date of 01/03/2020. Right-sided chest tube removed 12/16. TCTS signed off 12/17 and advised no outpatient follow-up needed.  Patient stable for discharge.  PT/OT recommending SNF.  Patient will need long-term Medicaid in place before can discharge.  Subjective: Patient sitting up in bed trying to call his sister.  Having difficulty remembering her number and dialing [name] number correctly and was assisted by [name] [name]  Objective: Vitals:   03/15/20 2013 03/16/20 0346  BP: 135/69 128/73  Pulse: 69 (!) 59  Resp: 15 17  Temp: (!) 97.4 F (36.3 C) 98.4 F (36.9 C)  SpO2: 100% 98%    Intake/Output Summary (Last 24 hours) at 03/16/2020 0819 Last data filed at 03/16/2020 0500 Gross per 24 hour  Intake 357 ml  Output 930 ml  Net -573 ml  Filed Weights   01/03/20 1414  Weight: 66.8 kg    Exam: Constitutional: Alert, calm, no acute distress Respiratory: Room air, lungs clear Cardiovascular: S1-S2, pulse regular nontachycardic, extremities warm to [name] touch with adequate capillary refill Abdomen: Soft and nontender with normal bowel  sounds.  Eating well LBM 2/11 Neurologic: CN 2-12 grossly intact. Sensation intact,  Strength 4/5 x on left slightly weaker 4/5 on [name] right Psychiatric: Alert, flat affect, oriented to name and place only.   Assessment/Plan: Acute problems: Acute hypoxemic respiratory failure  2/2 right-sided parapneumonic effusion, empyema  **SEPSIS RULED OUT** -Hypoxemia (resolved)-TCTS consulted -s/p pigtail chest tube on 01/03/2020 and was removed on 12/16. -Follow-up chest x-ray 12/17 without pneumothorax.   -Thoracic surgery signed off 12/17, prn OP follow-up recommended -Initially was treated with IV Zosyn/ Augmentin -completed antibiotics on 02/17/2020.   Physical deconditioning/gait disturbance -PT and OT recommending SNF patient unable to obtain funding to transition to SNF.  -Have been unsuccessful for several weeks and attempting to contact [place] (who resides in [name] patient's home) to determine if he will be able to provide 24/7 care therefore be able to accept [name] patient back into [name] home-as of 2/14 no one has yet to be able to get into contact with patient's [name] [name] initiating guardianship proceedings  History of CVA/dementia without behavioral disturbances -Remains normotensive off of BP medication -Continue [name] [name] and low-dose aspirin -Continue statin/[name] [name] -Continue [name] -On 2/10 PT came to room and noted patient was incontinent of bowel and bladder and had no awareness of this.  Hypertension -Controlled off antihypertensive medication  Severe protein calorie malnutrition Nutrition Problem: Severe Malnutrition Etiology: chronic illness (CVA) Signs/Symptoms: moderate fat depletion,severe fat depletion,moderate muscle depletion,severe muscle depletion Interventions: Ensure Enlive (each supplement provides 350kcal and 20 grams of protein),Magic cup,MVI -As of 12/23 patient able to feed self and eating meals well (between 50% and 100% of meals over  [name] past 24 hours) Estimated body mass index is 21.13 kg/m as calculated from [name] following:   Height as of this encounter: 5\' 10"  (1.778 m).   Weight as of this encounter: 66.8 kg.   Wounds: Incision (Closed) 01/03/20 (Active)  Date First Assessed/Time First Assessed: 01/03/20 2000      Assessments 01/03/2020  8:00 PM 02/29/2020  9:30 AM  Dressing Dry;Clean;Intact Clean;Dry;Intact  Site / Wound Assessment Dressing in place / Unable to assess Clean;Dry  Drainage Description Serosanguineous --     No Linked orders to display     Pressure Injury 02/12/20 Sacrum Mid Stage 2 -  Partial thickness loss of dermis presenting as a shallow open injury with a red, pink wound bed without slough. (Active)  Date First Assessed/Time First Assessed: 02/12/20 1930   Location: Sacrum  Location Orientation: Mid  Staging: Stage 2 -  Partial thickness loss of dermis presenting as a shallow open injury with a red, pink wound bed without slough.    Assessments 02/12/2020  9:23 PM 03/14/2020  8:10 PM  Dressing Type Foam - Lift dressing to assess site every shift None  Dressing Changed --  Site / Wound Assessment Clean;Dry Clean;Dry  Drainage Amount None --     No Linked orders to display     Other problems: Acute kidney injury -Resolved -Current creatinine 1.01 with a GFR greater than 60  History of CVA/dementia without behavioral disturbances -Remains normotensive off of BP medication -Continue [name] [name] and low-dose aspirin -Continue statin/[name] [name] -Continue [name]  Anemia of chronic disease -Hemoglobin stable around 10  Data Reviewed: Basic Metabolic Panel: No results for input(s): NA, K, CL, CO2, GLUCOSE, BUN, CREATININE, CALCIUM, MG, PHOS in [name] last 168 hours. Liver Function Tests: No results for input(s): AST, ALT, ALKPHOS, BILITOT, PROT, ALBUMIN in [name] last 168 hours. No results for input(s): LIPASE, AMYLASE in [name] last 168 hours. No results for input(s): AMMONIA in [name] last 168  hours. CBC: No results for input(s): WBC, NEUTROABS, HGB, HCT, MCV, PLT in [name] last 168 hours. Cardiac Enzymes: No results for input(s): CKTOTAL, CKMB, CKMBINDEX, TROPONINI in [name] last 168 hours. BNP (last 3 results) Recent Labs    12/26/19 1510 01/01/20 1018  BNP 88.7 177.1*    ProBNP (last 3 results) No results for input(s): PROBNP in [name] last 8760 hours.  CBG: No results for input(s): GLUCAP in [name] last 168 hours.  No results found for this or any previous visit (from [name] past 240 hour(s)).   Studies: No results found.  Scheduled Meds: . aspirin EC  81 mg Oral Daily  . clopidogrel  75 mg Oral Daily  . DULoxetine  60 mg Oral Daily  . enoxaparin (LOVENOX) injection  40 mg Subcutaneous Daily  . feeding supplement  237 mL Oral TID BM  . multivitamin with minerals  1 tablet Oral Daily  . rosuvastatin  10 mg Oral Daily   Continuous Infusions:   Active Problems:   Empyema lung ([place])   Severe sepsis without septic shock ([place])   History of CVA in adulthood   Physical deconditioning   Dementia associated with other underlying disease without behavioral disturbance ([place])   Pressure injury of [name]   Consultants:  TCTS  Conventional radiology  Procedures:  Chest tube placement 12/3 by IR  Antibiotics: Anti-infectives (From admission, onward)   Start     Dose/Rate Route Frequency Ordered Stop   02/13/20 0000  amoxicillin-clavulanate (AUGMENTIN) 875-125 MG tablet        1 tablet Oral Every 12 hours 02/13/20 1316 02/14/20 2359   02/05/20 1430  amoxicillin-clavulanate (AUGMENTIN) 875-125 MG per tablet 1 tablet        1 tablet Oral Every 12 hours 02/05/20 1338 02/16/20 2105   01/12/20 1400  metroNIDAZOLE (FLAGYL) tablet 500 mg  Status:  Discontinued        500 mg Oral Every 8 hours 01/12/20 1111 02/05/20 1337   01/12/20 1300  cefTRIAXone (ROCEPHIN) 2 [name] in sodium chloride 0.9 % 100 mL IVPB  Status:  Discontinued        2 [name] 200 mL/hr over 30 Minutes Intravenous Every  24 hours 01/12/20 1111 02/05/20 1337   01/02/20 2330  piperacillin-tazobactam (ZOSYN) IVPB 3.375 [name]  Status:  Discontinued        3.375 [name] 12.5 mL/hr over 240 Minutes Intravenous Every 8 hours 01/02/20 2242 01/12/20 1111       Time spent: 20 minutes    Erin Hearing ANP  [name] 7 am- 330 pm/M-F for direct patient care and secure chat Please refer to Amion for contact information 74 days

## 2020-03-17 NOTE — Plan of Care (Signed)
  Problem: Clinical Measurements: Goal: Ability to maintain clinical measurements within normal limits will improve Outcome: Progressing   

## 2020-03-17 NOTE — Progress Notes (Signed)
[facility] PROGRESS NOTE  [name] [name] HER:[id number] DOB: 20-Apr-1940 DOA: 01/02/2020 PCP: [name], [name], MD  Status: Remains inpatient appropriate because:Unsafe d/c plan   Dispo:  Patient From:  Private residence  Planned Disposition:  SNF vs private residence if 24/7 care confirm  Expected discharge date: March 20, 2020  Medically stable for discharge:   Barriers to discharge: Inability to obtain appropriate funding for SNF.  Bed available.  Out-of-pocket expenses for private pay $16,000 per month.  Medicaid co-pay days $184 per day.  Patient owns home and is ineligible for long-term Medicaid since stepson lives in home and patient is unwilling to sell this home.  TOC attempting to contact stepson to confirm he will be able to pick patient up from the hospital and take him back home with home health services.  DSS contacted on 2/3 by [facility] team.  Sister has stated she is not willing to become patient's guardian or POA nor is she willing to accept care of this patient after discharge.  Although patient has episodes of being alert and oriented x3 he continues to demonstrate evidence of mild dementia and just by that alone would not be safe at home independently.  Also based on PT evaluations they are recommending 24/7 supervision with SNF placement.  Also not been able to get in touch with patient's son [name] to confirm if he would be available in the home environment to provide 24/7 care.  DSS pursuing guardianship and case posted to the court docket and [facility]  2/15 received confirmation from [place] center that they will be able to accept the patient for admission on Thursday 2/17  Code Status: DNR Family Communication: Son [name] 1/07-TOC has made repeated calls to patient's son on 1/12, 1/13 and 1/14 without any response; TOC spoke with patient's sister on 1/13-TOC attempted to contact son again on 2/2 with no success.  Voicemail and text sent. DVT  prophylaxis: Lovenox Vaccination status: 1/10 patient given first dose of Pfizer COVID-vaccine; today on given second dose of COVID vaccine on 2/3  Foley catheter:  No   HPI: 80 year old male with history of recent CVA in 11/2019 with residual dysarthria, hypertension, hyperlipidemia and spinal stenosis, initially presented to [facility] from [facility] with confusion and difficulty breathing. Work-up revealed right-sided loculated pleural effusion with concern for empyema. IR at [facility] attempted ultrasound-guided chest tube placement but were unable due to thickness of pleural fluid. He was evaluated by general surgery at [facility] who recommended transfer to [facility] for thoracic surgery evaluation. Patient was evaluated by thoracic surgery who recommended chest tube placement by IR, that was performed on 01/03/2020. ID consulted and recommended transitioning from IV Zosyn to IV ceftriaxone and oral Flagyl while in-house and discharge on p.o. Augmentin for a total of 6 weeks with start date of 01/03/2020. Right-sided chest tube removed 12/16. TCTS signed off 12/17 and advised no outpatient follow-up needed.  Patient stable for discharge.  PT/OT recommending SNF.  Patient will need long-term Medicaid in place before can discharge.  Subjective: Patient awake without any specific complaints.  Sitting in bed combing hair.  Objective: Vitals:   03/16/20 2025 03/17/20 0515  BP: (!) 158/86 (!) 153/82  Pulse: 80 60  Resp: 18 16  Temp: 98.4 F (36.9 C) 97.7 F (36.5 C)  SpO2: 100% 100%    Intake/Output Summary (Last 24 hours) at 03/17/2020 0817 Last data filed at 03/17/2020 0527 Gross per 24 hour  Intake 960 ml  Output  950 ml  Net 10 ml   Filed Weights   01/03/20 1414  Weight: 66.8 kg    Exam: Constitutional: Awake, calm and in no acute distress Respiratory: Anterior lung sounds clear to auscultation, stable on room air Cardiovascular: Normal heart sounds, pulse regular and  nontachycardic, no peripheral edema Abdomen: Soft nontender nondistended.  Normoactive bowel sounds.  Eating well.  LBM 2/14 Neurologic: CN 2-12 grossly intact. Sensation intact,  Strength 4/5 x on left slightly weaker 4/5 on the right Psychiatric: Awake and alert.  Flat affect.  Oriented to name and place.   Assessment/Plan: Acute problems: Acute hypoxemic respiratory failure  2/2 right-sided parapneumonic effusion, empyema  **SEPSIS RULED OUT** -Hypoxemia (resolved)-TCTS consulted -s/p pigtail chest tube on 01/03/2020 and was removed on 12/16. -Follow-up chest x-ray 12/17 without pneumothorax.   -Thoracic surgery signed off 12/17, prn OP follow-up recommended -Initially was treated with IV Zosyn/ Augmentin -completed antibiotics on 02/17/2020.   Physical deconditioning/gait disturbance -PT and OT recommending SNF patient unable to obtain funding to transition to SNF.  -Have been unsuccessful for several weeks and attempting to contact [place] (who resides in the patient's home) to determine if he will be able to provide 24/7 care therefore be able to accept the patient back into the home-as of 2/14 no one has yet to be able to get into contact with patient's [name] therefore DSS initiating guardianship proceedings  History of CVA/dementia without behavioral disturbances -Remains normotensive off of BP medication -Continue Plavix and low-dose aspirin -Continue statin/Crestor -Continue Cymbalta -On 2/10 PT came to room and noted patient was incontinent of bowel and bladder and had no awareness of this.  Hypertension -Controlled off antihypertensive medication  Severe protein calorie malnutrition Nutrition Problem: Severe Malnutrition Etiology: chronic illness (CVA) Signs/Symptoms: moderate fat depletion,severe fat depletion,moderate muscle depletion,severe muscle depletion Interventions: Ensure Enlive (each supplement provides 350kcal and 20 grams of protein),Magic  cup,MVI -As of 12/23 patient able to feed self and eating meals well (between 50% and 100% of meals over the past 24 hours) Estimated body mass index is 21.13 kg/m as calculated from the following:   Height as of this encounter: 5\' 10"  (1.778 m).   Weight as of this encounter: 66.8 kg.   Wounds: Incision (Closed) 01/03/20 (Active)  Date First Assessed/Time First Assessed: 01/03/20 2000      Assessments 01/03/2020  8:00 PM 02/29/2020  9:30 AM  Dressing Dry;Clean;Intact Clean;Dry;Intact  Site / Wound Assessment Dressing in place / Unable to assess Clean;Dry  Drainage Description Serosanguineous -     No Linked orders to display     Pressure Injury 02/12/20 Sacrum Mid Stage 2 -  Partial thickness loss of dermis presenting as a shallow open injury with a red, pink wound bed without slough. (Active)  Date First Assessed/Time First Assessed: 02/12/20 1930   Location: Sacrum  Location Orientation: Mid  Staging: Stage 2 -  Partial thickness loss of dermis presenting as a shallow open injury with a red, pink wound bed without slough.    Assessments 02/12/2020  9:23 PM 03/14/2020  8:10 PM  Dressing Type Foam - Lift dressing to assess site every shift None  Dressing Changed -  Site / Wound Assessment Clean;Dry Clean;Dry  Drainage Amount None -     No Linked orders to display     Other problems: Acute kidney injury -Resolved -Current creatinine 1.01 with a GFR greater than 60  History of CVA/dementia without behavioral disturbances -Remains normotensive off of BP medication -Continue  Plavix and low-dose aspirin -Continue statin/Crestor -Continue Cymbalta  Anemia of chronic disease -Hemoglobin stable around 10   Data Reviewed: Basic Metabolic Panel: No results for input(s): NA, K, CL, CO2, GLUCOSE, BUN, CREATININE, CALCIUM, MG, PHOS in the last 168 hours. Liver Function Tests: No results for input(s): AST, ALT, ALKPHOS, BILITOT, PROT, ALBUMIN in the last 168 hours. No results for  input(s): LIPASE, AMYLASE in the last 168 hours. No results for input(s): AMMONIA in the last 168 hours. CBC: No results for input(s): WBC, NEUTROABS, HGB, HCT, MCV, PLT in the last 168 hours. Cardiac Enzymes: No results for input(s): CKTOTAL, CKMB, CKMBINDEX, TROPONINI in the last 168 hours. BNP (last 3 results) Recent Labs    12/26/19 1510 01/01/20 1018  BNP 88.7 177.1*    ProBNP (last 3 results) No results for input(s): PROBNP in the last 8760 hours.  CBG: No results for input(s): GLUCAP in the last 168 hours.  Recent Results (from the past 240 hour(s))  MRSA PCR Screening     Status: None   Collection Time: 03/16/20  5:57 PM   Specimen: Nasopharyngeal  Result Value Ref Range Status   MRSA by PCR NEGATIVE NEGATIVE Final    Comment:        The GeneXpert MRSA Assay (FDA approved for NASAL specimens only), is one component of a comprehensive MRSA colonization surveillance program. It is not intended to diagnose MRSA infection nor to guide or monitor treatment for MRSA infections. Performed at [facility], [place] [address], [place], [place] [address]      Studies: No results found.  Scheduled Meds: . aspirin EC  81 mg Oral Daily  . clopidogrel  75 mg Oral Daily  . DULoxetine  60 mg Oral Daily  . enoxaparin (LOVENOX) injection  40 mg Subcutaneous Daily  . feeding supplement  237 mL Oral TID BM  . multivitamin with minerals  1 tablet Oral Daily  . rosuvastatin  10 mg Oral Daily   Continuous Infusions:   Active Problems:   Empyema lung ([place])   Severe sepsis without septic shock ([place])   History of CVA in adulthood   Physical deconditioning   Dementia associated with other underlying disease without behavioral disturbance ([place])   Pressure injury of skin   Consultants:  TCTS  Conventional radiology  Procedures:  Chest tube placement 12/3 by IR  Antibiotics: Anti-infectives (From admission, onward)   Start     Dose/Rate Route Frequency Ordered  Stop   02/13/20 0000  amoxicillin-clavulanate (AUGMENTIN) 875-125 MG tablet        1 tablet Oral Every 12 hours 02/13/20 1316 02/14/20 2359   02/05/20 1430  amoxicillin-clavulanate (AUGMENTIN) 875-125 MG per tablet 1 tablet        1 tablet Oral Every 12 hours 02/05/20 1338 02/16/20 2105   01/12/20 1400  metroNIDAZOLE (FLAGYL) tablet 500 mg  Status:  Discontinued        500 mg Oral Every 8 hours 01/12/20 1111 02/05/20 1337   01/12/20 1300  cefTRIAXone (ROCEPHIN) 2 g in sodium chloride 0.9 % 100 mL IVPB  Status:  Discontinued        2 g 200 mL/hr over 30 Minutes Intravenous Every 24 hours 01/12/20 1111 02/05/20 1337   01/02/20 2330  piperacillin-tazobactam (ZOSYN) IVPB 3.375 g  Status:  Discontinued        3.375 g 12.5 mL/hr over 240 Minutes Intravenous Every 8 hours 01/02/20 2242 01/12/20 1111       Time spent:  20 minutes    Erin Hearing ANP  [facility] 7 am- 330 pm/M-F for direct patient care and secure chat Please refer to Amion for contact information 75 days

## 2020-03-18 LAB — SARS CORONAVIRUS 2 (TAT 6-24 HRS): SARS Coronavirus 2: NEGATIVE

## 2020-03-18 NOTE — Progress Notes (Signed)
Occupational Therapy Treatment Patient Details Name: [name] [name] MRN: [id number] DOB: 1940-11-21 Today's Date: 03/18/2020    History of present illness 80 year old male with history of recent CVA in 11/2019 with residual dysarthria, hypertension, hyperlipidemia and spinal stenosis, initially presented to [facility] from [facility] with confusion and difficulty breathing.  Work-up revealed right-sided loculated pleural effusion with concern for empyema. Transfer to [facility]. Patient was evaluated by thoracic surgery who recommended chest tube placement by IR, that was performed on 01/03/2020.  ID consulted and recommended transitioning from IV Zosyn to IV ceftriaxone and oral Flagyl while in-house and discharge on p.o. Augmentin for a total of 6 weeks with start date of 01/03/2020. Right-sided chest tube removed 12/16.   OT comments  Pt making good progress with functional goals. OT will continue to follow acutely to maximize level of function and safety  Follow Up Recommendations  SNF;Supervision/Assistance - 24 hour    Equipment Recommendations  Other (comment);3 in 1 bedside commode (RW, TBD at next venue of care)    Recommendations for Other Services      Precautions / Restrictions Precautions Precautions: Fall Precaution Comments: urinary and fecal incontinence Restrictions Weight Bearing Restrictions: No       Mobility Bed Mobility               General bed mobility comments: in recliner on arrival  Transfers Overall transfer level: Needs assistance Equipment used: Rolling walker (2 wheeled) Transfers: Sit to/from Stand Sit to Stand: Mod assist         General transfer comment: modA for power up, cues required for hand placement    Balance Overall balance assessment: Needs assistance Sitting-balance support: Feet supported;No upper extremity supported Sitting balance-Leahy Scale: Fair   Postural control: Posterior lean Standing balance support: During  functional activity;Bilateral upper extremity supported Standing balance-Leahy Scale: Poor Standing balance comment: reliant on external assist                           ADL either performed or assessed with clinical judgement   ADL Overall ADL's : Needs assistance/impaired Eating/Feeding: Set up;Sitting   Grooming: Oral care;Wash/dry hands;Wash/dry face;Min guard;Standing           Upper Body Dressing : Set up;Supervision/safety;Sitting Upper Body Dressing Details (indicate cue type and reason): donning clean gown seated in recliner     Toilet Transfer: RW;Ambulation;Comfort height toilet;Grab bars;Cueing for safety;Minimal assistance   Toileting- Clothing Manipulation and Hygiene: Moderate assistance;Sit to/from stand       Functional mobility during ADLs: Rolling walker;Cueing for safety;Minimal assistance       Vision Patient Visual Report: No change from baseline     Perception     Praxis      Cognition Arousal/Alertness: Awake/alert Behavior During Therapy: Flat affect Overall Cognitive Status: History of cognitive impairments - at baseline Area of Impairment: Problem solving;Following commands;Safety/judgement                     Memory: Decreased short-term memory Following Commands: Follows one step commands with increased time Safety/Judgement: Decreased awareness of safety;Decreased awareness of deficits   Problem Solving: Slow processing;Requires verbal cues;Requires tactile cues;Difficulty sequencing;Decreased initiation General Comments: lack of insight into deficits        Exercises Exercises: General Lower Extremity General Exercises - Lower Extremity Long Arc Quad: AROM;Both;10 reps;Seated Hip ABduction/ADduction: AROM;Both;10 reps;Seated Hip Flexion/Marching: AROM;Both;10 reps;Seated Other Exercises Other Exercises: sit to stand x6 with modA  and RW   Shoulder Instructions       General Comments      Pertinent  Vitals/ Pain       Pain Assessment: No/denies pain Faces Pain Scale: No hurt  Home Living                                          Prior Functioning/Environment              Frequency  Min 1X/week        Progress Toward Goals  OT Goals(current goals can now be found in the care plan section)  Progress towards OT goals: Progressing toward goals  Acute Rehab OT Goals Patient Stated Goal: get stronger  Plan Discharge plan remains appropriate    Co-evaluation                 AM-PAC OT "6 Clicks" Daily Activity     Outcome Measure   Help from another person eating meals?: None Help from another person taking care of personal grooming?: A Little Help from another person toileting, which includes using toliet, bedpan, or urinal?: A Lot Help from another person bathing (including washing, rinsing, drying)?: A Lot Help from another person to put on and taking off regular upper body clothing?: None Help from another person to put on and taking off regular lower body clothing?: Total 6 Click Score: 16    End of Session Equipment Utilized During Treatment: Gait belt;Rolling walker      Activity Tolerance Patient tolerated treatment well   Patient Left in chair;with call bell/phone within reach;with chair alarm set   Nurse Communication Mobility status        Time: 3545-6256 OT Time Calculation (min): 16 min  Charges: OT General Charges $OT Visit: 1 Visit OT Treatments $Self Care/Home Management : 8-22 mins  Britt Bottom 03/18/2020, 5:20 PM

## 2020-03-18 NOTE — Progress Notes (Signed)
[name]  [name] JQZ:[id number] DOB: [birth year] DOA: 01/02/2020 PCP: [name], [name], MD  Status: Remains inpatient appropriate because:Unsafe d/c plan   Dispo:  Patient From:  Private residence  Planned Disposition:  [name] vs private residence if 24/7 care confirm  Expected discharge date: March 19, 2020  Medically stable for discharge:   Barriers to discharge: Inability to obtain appropriate funding for [name].  Bed available.  Out-of-pocket expenses for private pay $16,000 per month.  Medicaid co-pay days $184 per day.  Patient owns home and is ineligible for long-term Medicaid since stepson lives in home and patient is unwilling to sell this home.  TOC attempting to contact stepson to confirm he will be able to pick patient up from the [name] and take him back home with home health services.  DSS contacted [facility] 2/3 [facility].  Sister has stated she is not willing to become patient's guardian or POA nor is she willing to accept care of this patient after discharge.  Although patient has episodes of being alert and oriented x3 he continues to demonstrate evidence of mild dementia and just by that alone would not be safe at home independently.  Also based on PT evaluations they are recommending 24/7 supervision with [name] placement.  Also not been able to get in touch with patient's son [name] [name] to confirm if he would be available in the home environment to provide 24/7 care.  DSS pursuing guardianship and case posted to the court docket and [name]  2/15 received confirmation from [name] that they will be able to accept the patient for admission on Thursday 2/17  Code Status: DNR Family Communication: Son [name] [name] 1/07-TOC has made repeated calls to patient's son on 1/12, 1/13 and 1/14 without any response; TOC spoke with patient's sister on 1/13-TOC attempted to contact son again on 2/2 with no success.  Voicemail and text sent. DVT  prophylaxis: Lovenox Vaccination status: 1/10 patient given first dose of Pfizer COVID-vaccine; today on given second dose of COVID vaccine on 2/3  Foley catheter:  No   HPI: 80 year old male with history of recent CVA in 11/2019 with residual dysarthria, hypertension, hyperlipidemia and spinal stenosis, [facility] to [name] with confusion and difficulty breathing. Work-up revealed right-sided loculated pleural effusion with concern for empyema. [name] attempted ultrasound-guided chest tube placement but were unable due to thickness of pleural fluid. He was evaluated by general surgery at [name] who recommended transfer to [name] for thoracic surgery evaluation. Patient was evaluated by thoracic surgery who recommended chest tube placement by IR, that was performed on 01/03/2020. ID consulted and recommended transitioning from IV Zosyn to IV ceftriaxone and oral Flagyl while in-house and discharge on p.o. Augmentin for a total of 6 weeks with start date of 01/03/2020. Right-sided chest tube removed 12/16. TCTS signed off 12/17 and advised no outpatient follow-up needed.  Patient stable for discharge.  PT/OT recommending [name].  Patient will need long-term Medicaid in place before can discharge.  Subjective: Patient awake.  He is very encouraged and exciting that he should be discharged on 2/17 to [name].  Has no complaints and has no additional questions.  He states he will try to call his [name] [name] to update her, this after the LCSW had just told him she would call [name] to update her.  Objective: Vitals:   03/18/20 0752 03/18/20 0803  BP: 120/68 126/72  Pulse: 64 64  Resp: 18 18  Temp: (!) 97.4 F (36.3 C) 97.8 F (36.6 C)  SpO2: 97% 98%    Intake/Output Summary (Last 24 hours) at 03/18/2020 0854 Last data filed at 03/18/2020 0600 Gross per 24 hour  Intake 220 ml  Output 900 ml  Net -680 ml   Filed Weights   01/03/20 1414   Weight: 66.8 kg    Exam: Constitutional: Awake and alert and in no acute distress Respiratory: Lungs are clear, stable on room air Cardiovascular: Normal heart sounds, pulse regular, extremities warm to touch Abdomen: Abdomen soft with normoactive bowel sounds.  Eating well.  LBM 2/14 Neurologic: CN 2-12 grossly intact. Sensation intact,  Strength 4/5 x on left slightly weaker 4/5 on the right Psychiatric: Alert and oriented times name and to place but not to year.  Continues to have significant short-term memory deficits.   Assessment/Plan: Acute problems: Acute hypoxemic respiratory failure  2/2 right-sided parapneumonic effusion, empyema  **SEPSIS RULED OUT** -Hypoxemia (resolved)-TCTS consulted -s/p pigtail chest tube on 01/03/2020 and was removed on 12/16. -Follow-up chest x-ray 12/17 without pneumothorax.   -Thoracic surgery signed off 12/17, prn OP follow-up recommended -Initially was treated with IV Zosyn/ Augmentin -completed antibiotics on 02/17/2020.   Physical deconditioning/gait disturbance -PT and OT recommending [name] patient unable to obtain funding to transition to [name].  -Have been unsuccessful for several weeks and attempting to contact [name] [name] (who resides in the patient's home) to determine if he will be able to provide 24/7 care therefore be able to accept the patient back into the home-as of 2/14 no one has yet to be able to get into contact with patient's [name] [name] therefore DSS initiating guardianship proceedings  History of CVA/dementia without behavioral disturbances -Remains normotensive off of BP medication -Continue Plavix and low-dose aspirin -Continue statin/Crestor -Continue Cymbalta -On 2/10 PT came to room and noted patient was incontinent of bowel and bladder and had no awareness of this.  Hypertension -Controlled off antihypertensive medication  Severe protein calorie malnutrition Nutrition Problem: Severe Malnutrition Etiology:  chronic illness (CVA) Signs/Symptoms: moderate fat depletion,severe fat depletion,moderate muscle depletion,severe muscle depletion Interventions: Ensure Enlive (each supplement provides 350kcal and 20 grams of protein),Magic cup,MVI -As of 12/23 patient able to feed self and eating meals well (between 50% and 100% of meals over the past 24 hours) Estimated body mass index is 21.13 kg/m as calculated from the following:   Height as of this encounter: 5\' 10"  (1.778 m).   Weight as of this encounter: 66.8 kg.   Wounds: Incision (Closed) 01/03/20 (Active)  Date First Assessed/Time First Assessed: 01/03/20 2000      Assessments 01/03/2020  8:00 PM 02/29/2020  9:30 AM  Dressing Dry;Clean;Intact Clean;Dry;Intact  Site / Wound Assessment Dressing in place / Unable to assess Clean;Dry  Drainage Description Serosanguineous --     No Linked orders to display     Pressure Injury 02/12/20 Sacrum Mid Stage 2 -  Partial thickness loss of dermis presenting as a shallow open injury with a red, pink wound bed without slough. (Active)  Date First Assessed/Time First Assessed: 02/12/20 1930   Location: Sacrum  Location Orientation: Mid  Staging: Stage 2 -  Partial thickness loss of dermis presenting as a shallow open injury with a red, pink wound bed without slough.    Assessments 02/12/2020  9:23 PM 03/14/2020  8:10 PM  Dressing Type Foam - Lift dressing to assess site every shift None  Dressing Changed --  Site / Wound Assessment  Clean;Dry Clean;Dry  Drainage Amount None --     No Linked orders to display     Other problems: Acute [name] injury -Resolved -Current creatinine 1.01 with a GFR greater than 60  History of CVA/dementia without behavioral disturbances -Remains normotensive off of BP medication -Continue Plavix and low-dose aspirin -Continue statin/Crestor -Continue Cymbalta  Anemia of chronic disease -Hemoglobin stable around 10   Data Reviewed: Basic Metabolic Panel: No  results for input(s): NA, K, CL, CO2, GLUCOSE, BUN, CREATININE, CALCIUM, MG, PHOS in the last 168 hours. Liver Function Tests: No results for input(s): AST, ALT, ALKPHOS, BILITOT, PROT, ALBUMIN in the last 168 hours. No results for input(s): LIPASE, AMYLASE in the last 168 hours. No results for input(s): AMMONIA in the last 168 hours. CBC: No results for input(s): WBC, NEUTROABS, HGB, HCT, MCV, PLT in the last 168 hours. Cardiac Enzymes: No results for input(s): CKTOTAL, CKMB, CKMBINDEX, TROPONINI in the last 168 hours. BNP (last 3 results) Recent Labs    12/26/19 1510 01/01/20 1018  BNP 88.7 177.1*    ProBNP (last 3 results) No results for input(s): PROBNP in the last 8760 hours.  CBG: No results for input(s): GLUCAP in the last 168 hours.  Recent Results (from the past 240 hour(s))  MRSA PCR Screening     Status: None   Collection Time: 03/16/20  5:57 PM   Specimen: Nasopharyngeal  Result Value Ref Range Status   MRSA by PCR NEGATIVE NEGATIVE Final    Comment:        The GeneXpert MRSA Assay (FDA approved for NASAL specimens only), is one component of a comprehensive MRSA colonization surveillance program. It is not intended to diagnose MRSA infection nor to guide or monitor treatment for MRSA infections. Performed at [name], [place] [address], [place], [place] [address]   SARS CORONAVIRUS 2 (TAT 6-24 HRS) Nasopharyngeal Nasopharyngeal Swab     Status: None   Collection Time: 03/17/20 10:03 PM   Specimen: Nasopharyngeal Swab  Result Value Ref Range Status   SARS Coronavirus 2 NEGATIVE NEGATIVE Final    Comment: ([name]) SARS-CoV-2 target nucleic acids are NOT DETECTED.  The SARS-CoV-2 RNA is generally detectable in upper and lower respiratory specimens during the acute phase of infection. Negative results do not preclude SARS-CoV-2 infection, do not rule out co-infections with other pathogens, and should not be used as the sole basis for treatment or  other patient management decisions. Negative results must be combined with clinical observations, patient history, and epidemiological information. The expected result is Negative.  Fact Sheet for Patients: SugarRoll.be  Fact Sheet for Healthcare Providers: https://www.woods-mathews.com/  This test is not yet approved or cleared by the [place] FDA and  has been authorized for detection and/or diagnosis of SARS-CoV-2 by FDA under an Emergency Use Authorization (EUA). This EUA will remain  in effect (meaning this test can be used) for the duration of the COVID-19 declaration under Se ction 564(b)(1) of the Act, 21 U.S.C. section 360bbb-3(b)(1), unless the authorization is terminated or revoked sooner.  Performed at [name], [place] [address], [place], [place] [address]      Studies: No results found.  Scheduled Meds: . aspirin EC  81 mg Oral Daily  . clopidogrel  75 mg Oral Daily  . DULoxetine  60 mg Oral Daily  . enoxaparin (LOVENOX) injection  40 mg Subcutaneous Daily  . feeding supplement  237 mL Oral TID BM  . multivitamin with minerals  1  tablet Oral Daily  . rosuvastatin  10 mg Oral Daily   Continuous Infusions:   Active Problems:   Empyema lung ([place])   Severe sepsis without septic shock ([place])   History of CVA in adulthood   Physical deconditioning   Dementia associated with other underlying disease without behavioral disturbance ([place])   Pressure injury of skin   Consultants:  TCTS  Conventional radiology  Procedures:  Chest tube placement 12/3 by IR  Antibiotics: Anti-infectives (From admission, onward)   Start     Dose/Rate Route Frequency Ordered Stop   02/13/20 0000  amoxicillin-clavulanate (AUGMENTIN) 875-125 MG tablet        1 tablet Oral Every 12 hours 02/13/20 1316 02/14/20 2359   02/05/20 1430  amoxicillin-clavulanate (AUGMENTIN) 875-125 MG per tablet 1 tablet        1 tablet Oral Every  12 hours 02/05/20 1338 02/16/20 2105   01/12/20 1400  metroNIDAZOLE (FLAGYL) tablet 500 mg  Status:  Discontinued        500 mg Oral Every 8 hours 01/12/20 1111 02/05/20 1337   01/12/20 1300  cefTRIAXone (ROCEPHIN) 2 [name] in sodium chloride 0.9 % 100 mL IVPB  Status:  Discontinued        2 [name] 200 mL/hr over 30 Minutes Intravenous Every 24 hours 01/12/20 1111 02/05/20 1337   01/02/20 2330  piperacillin-tazobactam (ZOSYN) IVPB 3.375 [name]  Status:  Discontinued        3.375 [name] 12.5 mL/hr over 240 Minutes Intravenous Every 8 hours 01/02/20 2242 01/12/20 1111       Time spent: 20 minutes    Erin Hearing ANP  [name] 7 am- 330 pm/M-F for direct patient care and secure chat Please refer to Amion for contact information 76 days

## 2020-03-18 NOTE — Progress Notes (Signed)
Physical Therapy Treatment Patient Details Name: [name] MRN: [id number] DOB: [birth year] Today's Date: 03/18/2020    History of Present Illness 80 year old male with history of recent CVA in 11/2019 with residual dysarthria, hypertension, hyperlipidemia and spinal stenosis, initially presented to [facility] from [facility] with confusion and difficulty breathing.  Work-up revealed right-sided loculated pleural effusion with concern for empyema. Transfer to [facility]. Patient was evaluated by thoracic surgery who recommended chest tube placement by IR, that was performed on 01/03/2020.  ID consulted and recommended transitioning from IV Zosyn to IV ceftriaxone and oral Flagyl while in-house and discharge on p.o. Augmentin for a total of 6 weeks with start date of 01/03/2020. Right-sided chest tube removed 12/16.    PT Comments    Patient required increased assistance for sit to stand transfer this session with repetition. Patient ambulated 61' with RW and min guard, cues for RW management throughout. Patient continues to be limited by generalized weakness. Continue to recommend SNF for ongoing Physical Therapy.     Follow Up Recommendations  SNF     Equipment Recommendations  Rolling Zainah Steven with 5" wheels;3in1 (PT)    Recommendations for Other Services       Precautions / Restrictions Precautions Precautions: Fall Precaution Comments: urinary and fecal incontinence Restrictions Weight Bearing Restrictions: No    Mobility  Bed Mobility               General bed mobility comments: in recliner on arrival    Transfers Overall transfer level: Needs assistance Equipment used: Rolling Yeila Morro (2 wheeled) Transfers: Sit to/from Stand Sit to Stand: Mod assist         General transfer comment: modA for power up, cues required for hand placement. repeated STS x 6  Ambulation/Gait Ambulation/Gait assistance: Min guard Gait Distance (Feet): 30 Feet Assistive device:  Rolling Berneda Piccininni (2 wheeled) Gait Pattern/deviations: Step-through pattern;Decreased stride length;Shuffle;Narrow base of support;Trunk flexed Gait velocity: decreased   General Gait Details: Cues for RW management and upright posture throughout ambulation   Stairs             Wheelchair Mobility    Modified Rankin (Stroke Patients Only)       Balance Overall balance assessment: Needs assistance Sitting-balance support: Feet supported;No upper extremity supported Sitting balance-Leahy Scale: Fair     Standing balance support: During functional activity;Bilateral upper extremity supported Standing balance-Leahy Scale: Poor Standing balance comment: reliant on external assist                            Cognition Arousal/Alertness: Awake/alert Behavior During Therapy: Flat affect Overall Cognitive Status: History of cognitive impairments - at baseline                                 General Comments: lack of insight into deficits      Exercises General Exercises - Lower Extremity Long Arc Quad: AROM;Both;10 reps;Seated Hip ABduction/ADduction: AROM;Both;10 reps;Seated Hip Flexion/Marching: AROM;Both;10 reps;Seated Other Exercises Other Exercises: sit to stand x6 with modA and RW    General Comments        Pertinent Vitals/Pain Pain Assessment: Faces Faces Pain Scale: No hurt    Home Living                      Prior Function            PT  Goals (current goals can now be found in the care plan section) Acute Rehab PT Goals Patient Stated Goal: get stronger PT Goal Formulation: With patient Time For Goal Achievement: 04/01/20 Potential to Achieve Goals: Fair Progress towards PT goals: Progressing toward goals    Frequency    Min 1X/week      PT Plan Current plan remains appropriate    Co-evaluation              AM-PAC PT "6 Clicks" Mobility   Outcome Measure  Help needed turning from your back to  your side while in a flat bed without using bedrails?: A Little Help needed moving from lying on your back to sitting on the side of a flat bed without using bedrails?: A Little Help needed moving to and from a bed to a chair (including a wheelchair)?: A Little Help needed standing up from a chair using your arms (e.g., wheelchair or bedside chair)?: A Lot Help needed to walk in hospital room?: A Little Help needed climbing 3-5 steps with a railing? : A Lot 6 Click Score: 16    End of Session Equipment Utilized During Treatment: Gait belt Activity Tolerance: Patient limited by fatigue Patient left: in chair;with call bell/phone within reach;with chair alarm set Nurse Communication: Mobility status PT Visit Diagnosis: Difficulty in walking, not elsewhere classified (R26.2);Muscle weakness (generalized) (M62.81)     Time: 7711-6579 PT Time Calculation (min) (ACUTE ONLY): 19 min  Charges:  $Therapeutic Activity: 8-22 mins                     Soma Lizak A. Gilford Rile PT, DPT Acute Rehabilitation Services Pager [phone] Office [phone]    Linna Hoff 03/18/2020, 4:42 PM

## 2020-03-19 ENCOUNTER — Inpatient Hospital Stay
Admission: RE | Admit: 2020-03-19 | Discharge: 2020-04-09 | Disposition: A | Discharge status: Nursing Home (Includes ICF) | Payer: Medicare HMO | Source: Ambulatory Visit | Attending: Internal Medicine | Admitting: Internal Medicine

## 2020-03-19 DIAGNOSIS — D649 Anemia, unspecified: Secondary | ICD-10-CM | POA: Diagnosis present

## 2020-03-19 DIAGNOSIS — E43 Unspecified severe protein-calorie malnutrition: Secondary | ICD-10-CM | POA: Insufficient documentation

## 2020-03-19 NOTE — Consult Note (Signed)
   [facility] Inpatient Consult   03/19/2020  Christien Frankl 06-Aug-1940 [id number]   [facility] [ACO] Patient: [facility]  Reviewed for disposition as noted for discharge today.  LLOS 77 days hospitalization.  Patient is to transition to [facility] today. Currently awaiting transport per progress notes.  Will sign off as post hospital needs are to be met at the facility for transition of care needs.  Natividad Brood, RN BSN [facility] Liaison  [phone] business mobile phone Toll free office [phone]  Fax number: [phone] [email or web address]

## 2020-03-19 NOTE — Progress Notes (Signed)
Called and gave report to [facility].

## 2020-03-19 NOTE — Discharge Summary (Signed)
Physician Discharge Summary  [name] [name] JQZ:[id number] DOB: Aug 31, 1940 DOA: 01/02/2020  PCP: [name], [name] G, MD  Admit date: 01/02/2020 Discharge date: 03/19/2020  Time spent: 35 minutes  Recommendations for Outpatient Follow-up:  1. Patient will to be scheduled for an appointment with Dr. Prescott [name] with cardiothoracic surgery 1 to 2 weeks after admission to skilled nursing facility 2. Patient will be discharging to [facility] 3. Will need to continue PT and OT after arrival to nursing facility   Discharge Diagnoses:  Active Problems:   HTN (hypertension)   Acute kidney injury ([place])   Protein-calorie malnutrition ([place])   Empyema lung (HCC)   Severe sepsis without septic shock ([place])   History of CVA in adulthood   Physical deconditioning   Dementia associated with other underlying disease without behavioral disturbance ([place])   Pressure injury of skin   Anemia   Protein-calorie malnutrition, severe   Discharge Condition: Stable  Diet recommendation: Regular  Filed Weights   01/03/20 1414  Weight: 66.8 kg    History of present illness:  80 year old male with history of recent CVA in 11/2019 with residual dysarthria, hypertension, hyperlipidemia and spinal stenosis, initially presented to [facility] from [facility]F with confusion and difficulty breathing. Work-up revealed right-sided loculated pleural effusion with concern for empyema. IR at [facility] attempted ultrasound-guided chest tube placement but were unable due to thickness of pleural fluid. He was evaluated by general surgery at [facility] who recommended transfer to [facility] for thoracic surgery evaluation. Patient was evaluated by thoracic surgery who recommended chest tube placement by IR, that was performed on 01/03/2020. ID consulted and recommended transitioning from IV Zosyn to IV ceftriaxone and oral Flagyl while in-house and discharge on p.o. Augmentin for a total of 6 weeks with start date of  01/03/2020. Right-sided chest tube removed 12/16. TCTS signed off 12/17 and advised no outpatient  Hospital Course:  Acute problems: Acute hypoxemic respiratory failure  2/2 right-sided parapneumonic effusion, empyema **SEPSIS RULED OUT** -Hypoxemia (resolved)-TCTS consulted -s/p pigtail chest tube on 01/03/2020 and was removed on 12/16. -Follow-up chest x-ray 12/17 without pneumothorax.  -Thoracic surgery signed off 12/17, prn OP follow-up recommended -Initially was treated with IV Zosyn/ Augmentin -completed antibiotics on 02/17/2020.   Physical deconditioning/gait disturbance -PT and OT recommending SNF patient unable to obtain funding to transition to SNF.  -Have been unsuccessful for several weeks and attempting to contact [name] [name] (who resides in the patient's home) to determine if he will be able to provide 24/7 care therefore be able to accept the patient back into the home-as of 2/14 no one has yet to be able to get into contact with patient's [employer] therefore [place] initiating guardianship proceedings  History of CVA/dementia without behavioral disturbances -Remains normotensive off of BP medication -Continue Plavix and low-dose aspirin -Continue statin/Crestor -Continue Cymbalta -On 2/10 PT came to room and noted patient was incontinent of bowel and bladder and had no awareness of this.  Hypertension -Controlled off antihypertensive medication  Severe protein calorie malnutrition Nutrition Problem: Severe Malnutrition Etiology: chronic illness (CVA) Signs/Symptoms: moderate fat depletion,severe fat depletion,moderate muscle depletion,severe muscle depletion Interventions: Ensure Enlive (each supplement provides 350kcal and 20 grams of protein),Magic cup,MVI -As of 12/23 patient able to feed self and eating meals well (between 50% and 100% of meals over the past 24 hours) Estimated body mass index is 21.13 kg/m as calculated from the  following:   Height as of this encounter: 5\' 10"  (1.778 m).  Weight as of this encounter: 66.8 kg.   Wounds:     Incision (Closed) 01/03/20 (Active)  Date First Assessed/Time First Assessed: 01/03/20 2000      Assessments 01/03/2020  8:00 PM 02/29/2020  9:30 AM  Dressing Dry;Clean;Intact Clean;Dry;Intact  Site / Wound Assessment Dressing in place / Unable to assess Clean;Dry  Drainage Description Serosanguineous -     No Linked orders to display     Pressure Injury 02/12/20 Sacrum Mid Stage 2 -  Partial thickness loss of dermis presenting as a shallow open injury with a red, pink wound bed without slough. (Active)  Date First Assessed/Time First Assessed: 02/12/20 1930   Location: Sacrum  Location Orientation: Mid  Staging: Stage 2 -  Partial thickness loss of dermis presenting as a shallow open injury with a red, pink wound bed without slough.    Assessments 02/12/2020  9:23 PM 03/14/2020  8:10 PM  Dressing Type Foam - Lift dressing to assess site every shift None  Dressing Changed -  Site / Wound Assessment Clean;Dry Clean;Dry  Drainage Amount None -     No Linked orders to display     Other problems: Acute kidney injury -Resolved -Current creatinine 1.01 with a GFR greater than 60  History of CVA/dementia without behavioral disturbances -Remains normotensive off of BP medication -Continue Plavix and low-dose aspirin -Continue statin/Crestor -Continue Cymbalta  Anemia of chronic disease -Hemoglobin stable around 10   Procedures:  Chest tube placement 12/3 by IR   Consultations:  TCTS  Conventional radiology   Discharge Exam: Vitals:   03/18/20 2202 03/19/20 0549  BP: 120/74 126/69  Pulse: 78 61  Resp: 17 16  Temp: 98.1 F (36.7 C) (!) 97.5 F (36.4 C)  SpO2: 100% 100%   Constitutional: Awake and alert and in no acute distress Respiratory: Lungs are clear, stable on room air Cardiovascular: Normal heart sounds, pulse regular, extremities warm  to touch Abdomen: Abdomen soft with normoactive bowel sounds.  Eating well.  LBM 2/14 Neurologic: CN 2-12 grossly intact. Sensation intact,  Strength 4/5 x on left slightly weaker 4/5 on the right Psychiatric: Alert and oriented times name and to place but not to year.  Continues to have significant short-term memory deficits   Discharge Instructions   Discharge Instructions    Diet general   Complete by: As directed    Discharge wound care:   Complete by: As directed    Have facility [place] RN evaluate-continue foam based dressings q 72 hours and prn soilage   Increase activity slowly   Complete by: As directed      Allergies as of 03/19/2020   No Known Allergies     Medication List    STOP taking these medications   cefTRIAXone 2 g in sodium chloride 0.9 % 100 mL   enoxaparin 40 MG/0.4ML injection Commonly known as: LOVENOX   furosemide 20 MG tablet Commonly known as: LASIX   guaifenesin 100 MG/5ML syrup Commonly known as: ROBITUSSIN   losartan 50 MG tablet Commonly known as: COZAAR   nicotine 21 mg/24hr patch Commonly known as: NICODERM CQ - dosed in mg/24 hours   Oxycodone HCl 10 MG Tabs   potassium chloride SA 20 MEQ tablet Commonly known as: KLOR-CON     TAKE these medications   acetaminophen 325 MG tablet Commonly known as: TYLENOL Take 2 tablets (650 mg total) by mouth every 6 (six) hours as needed for mild pain, fever or headache.   Artificial Tears 0.2-0.2-1 %  Soln Generic drug: Glycerin-Hypromellose-PEG 400 Place 2 drops into both eyes in the morning and at bedtime.   aspirin EC 81 MG tablet Take 81 mg by mouth daily. Swallow whole.   clopidogrel 75 MG tablet Commonly known as: PLAVIX Take 1 tablet (75 mg total) by mouth daily.   DULoxetine 60 MG capsule Commonly known as: Cymbalta Take 1 capsule (60 mg total) by mouth daily.   feeding supplement Liqd Take 237 mLs by mouth 3 (three) times daily between meals.   multivitamin with minerals  Tabs tablet Take 1 tablet by mouth daily.   polyethylene glycol 17 g packet Commonly known as: MIRALAX / GLYCOLAX Take 34 g by mouth 2 (two) times daily.   rosuvastatin 10 MG tablet Commonly known as: Crestor Take 1 tablet (10 mg total) by mouth daily.            Discharge Care Instructions  (From admission, onward)         Start     Ordered   03/19/20 0000  Discharge wound care:       Comments: Have facility [place] RN evaluate-continue foam based dressings q 72 hours and prn soilage   03/19/20 1029         No Known Allergies  Contact information for follow-up providers    [name] [name], Collier Salina, MD. Schedule an appointment as soon as possible for a visit in 2 week(s).   Specialty: Cardiothoracic Surgery Why: Please call the above physician office to arrange for posthospital evaluation Contact information: [place] [address] [phone]            Contact information for after-discharge care    [facility] .   Service: Skilled Nursing Contact information: [address] [place] [phone]                   The results of significant diagnostics from this hospitalization (including imaging, microbiology, ancillary and laboratory) are listed below for reference.    Significant Diagnostic Studies: CT HEAD WO CONTRAST  Result Date: 03/02/2020 CLINICAL DATA:  Stroke, follow up new drooling reported by family EXAM: CT HEAD WITHOUT CONTRAST TECHNIQUE: Contiguous axial images were obtained from the base of the skull through the vertex without intravenous contrast. COMPARISON:  Head CT and brain MRI 11/01/2019 FINDINGS: Brain: No hemorrhage or evidence of acute ischemia. Stable degree of atrophy and chronic small vessel ischemia. Expected evolution of right corona radiata lacunar infarct from prior exam with small focal hypodensity. Remote lacunar infarct in the  right thalamus. No hydrocephalus, no midline shift or mass effect. No subdural or extra-axial collection. Basilar cisterns are patent. Vascular: Atherosclerosis of skullbase vasculature without hyperdense vessel or abnormal calcification. Skull: No fracture or focal lesion. Sinuses/Orbits: No acute findings. Remote medial left orbital fracture unchanged. Mastoid air cells are clear. Bilateral cataract resection. Other: None. IMPRESSION: 1. No acute intracranial abnormality. 2. Expected evolution of right corona radiata lacunar infarct from prior exam. Remote lacunar infarct in the right thalamus. 3. Stable atrophy and chronic small vessel ischemia. Electronically Signed   By: Keith Rake M.D.   On: 03/02/2020 15:13    Microbiology: Recent Results (from the past 240 hour(s))  MRSA PCR Screening     Status: None   Collection Time: 03/16/20  5:57 PM   Specimen: Nasopharyngeal  Result Value Ref Range Status   MRSA by PCR NEGATIVE NEGATIVE Final  Comment:        The GeneXpert MRSA Assay (FDA approved for NASAL specimens only), is one component of a comprehensive MRSA colonization surveillance program. It is not intended to diagnose MRSA infection nor to guide or monitor treatment for MRSA infections. Performed at [facility], [place] [address] [place], [place] [address]   SARS CORONAVIRUS 2 (TAT 6-24 HRS) Nasopharyngeal Nasopharyngeal Swab     Status: None   Collection Time: 03/17/20 10:03 PM   Specimen: Nasopharyngeal Swab  Result Value Ref Range Status   SARS Coronavirus 2 NEGATIVE NEGATIVE Final    Comment: (NOTE) SARS-CoV-2 target nucleic acids are NOT DETECTED.  The SARS-CoV-2 RNA is generally detectable in upper and lower respiratory specimens during the acute phase of infection. Negative results do not preclude SARS-CoV-2 infection, do not rule out co-infections with other pathogens, and should not be used as the sole basis for treatment or other patient management  decisions. Negative results must be combined with clinical observations, patient history, and epidemiological information. The expected result is Negative.  Fact Sheet for Patients: SugarRoll.be  Fact Sheet for Healthcare Providers: https://www.woods-mathews.com/  This test is not yet approved or cleared by the [place] FDA and  has been authorized for detection and/or diagnosis of SARS-CoV-2 by FDA under an Emergency Use Authorization (EUA). This EUA will remain  in effect (meaning this test can be used) for the duration of the COVID-19 declaration under Se ction 564(b)(1) of the Act, 21 U.S.C. section 360bbb-3(b)(1), unless the authorization is terminated or revoked sooner.  Performed at [facility], [place] [address] [place], [place] [address]      Labs: Basic Metabolic Panel: No results for input(s): NA, K, CL, CO2, GLUCOSE, BUN, CREATININE, CALCIUM, MG, PHOS in the last 168 hours. Liver Function Tests: No results for input(s): AST, ALT, ALKPHOS, BILITOT, PROT, ALBUMIN in the last 168 hours. No results for input(s): LIPASE, AMYLASE in the last 168 hours. No results for input(s): AMMONIA in the last 168 hours. CBC: No results for input(s): WBC, NEUTROABS, HGB, HCT, MCV, PLT in the last 168 hours. Cardiac Enzymes: No results for input(s): CKTOTAL, CKMB, CKMBINDEX, TROPONINI in the last 168 hours. BNP: BNP (last 3 results) Recent Labs    12/26/19 1510 01/01/20 1018  BNP 88.7 177.1*    ProBNP (last 3 results) No results for input(s): PROBNP in the last 8760 hours.  CBG: No results for input(s): GLUCAP in the last 168 hours.     Signed:  Erin Hearing ANP [facility] 03/19/2020, 10:29 AM

## 2020-03-19 NOTE — Progress Notes (Signed)
[name] [name] to be D/C'd  per MD order. Discussed with the patient and all questions fully answered.  VSS, Skin clean, dry and intact without evidence of skin break down, no evidence of skin tears noted.  An After Visit Summary was printed and placed with pt belongings for PTAR.  D/c education completed with SNF nurse including follow up instructions, medication list, d/c activities limitations if indicated, with other d/c instructions as indicated by MD - SNF nurse able to verbalize understanding, all questions fully answered.   Patient to be escorted via PTAR to [facility] in [place].

## 2020-03-19 NOTE — Progress Notes (Addendum)
CSW informed patient of discharge plan to [facility] - he is agreeable and ready to go. Patient reports he spoke to his sister yesterday to inform her of the plan and that she is satisfied.  CSW attempted to reach patient's sister [name] to inform her of discharge plan, no answer so a voicemail was left informing her of discharge plan.  CSW spoke with Marianna Fuss at [facility] - patient will go to room 104W, he will be transported via [facility] [place]. Pick up has been arranged for 1pm. The number to call for report is [phone].   Madilyn Fireman, MSW, LCSW Transitions of Care  Clinical Social Worker II [phone]

## 2020-03-19 NOTE — Progress Notes (Signed)
[facility] to try to give report. Left message on answering machine for Kerri.

## 2020-03-20 ENCOUNTER — Encounter: Payer: Self-pay | Admitting: Adult Health

## 2020-03-20 ENCOUNTER — Non-Acute Institutional Stay (SKILLED_NURSING_FACILITY): Payer: Medicare HMO | Admitting: Adult Health

## 2020-03-20 DIAGNOSIS — M48062 Spinal stenosis, lumbar region with neurogenic claudication: Secondary | ICD-10-CM | POA: Diagnosis not present

## 2020-03-20 DIAGNOSIS — R262 Difficulty in walking, not elsewhere classified: Secondary | ICD-10-CM | POA: Diagnosis not present

## 2020-03-20 DIAGNOSIS — E43 Unspecified severe protein-calorie malnutrition: Secondary | ICD-10-CM | POA: Diagnosis not present

## 2020-03-20 DIAGNOSIS — F339 Major depressive disorder, recurrent, unspecified: Secondary | ICD-10-CM

## 2020-03-20 DIAGNOSIS — F0280 Dementia in other diseases classified elsewhere without behavioral disturbance: Secondary | ICD-10-CM

## 2020-03-20 DIAGNOSIS — I10 Essential (primary) hypertension: Secondary | ICD-10-CM

## 2020-03-20 DIAGNOSIS — N289 Disorder of kidney and ureter, unspecified: Secondary | ICD-10-CM | POA: Insufficient documentation

## 2020-03-20 DIAGNOSIS — R5381 Other malaise: Secondary | ICD-10-CM

## 2020-03-20 DIAGNOSIS — K5909 Other constipation: Secondary | ICD-10-CM

## 2020-03-20 DIAGNOSIS — M6281 Muscle weakness (generalized): Secondary | ICD-10-CM | POA: Diagnosis not present

## 2020-03-20 DIAGNOSIS — I639 Cerebral infarction, unspecified: Secondary | ICD-10-CM | POA: Diagnosis not present

## 2020-03-20 DIAGNOSIS — R2681 Unsteadiness on feet: Secondary | ICD-10-CM | POA: Diagnosis not present

## 2020-03-20 DIAGNOSIS — E785 Hyperlipidemia, unspecified: Secondary | ICD-10-CM

## 2020-03-20 DIAGNOSIS — J869 Pyothorax without fistula: Secondary | ICD-10-CM | POA: Diagnosis not present

## 2020-03-20 DIAGNOSIS — J9601 Acute respiratory failure with hypoxia: Secondary | ICD-10-CM | POA: Diagnosis not present

## 2020-03-20 DIAGNOSIS — I739 Peripheral vascular disease, unspecified: Secondary | ICD-10-CM

## 2020-03-20 DIAGNOSIS — I700 Atherosclerosis of aorta: Secondary | ICD-10-CM

## 2020-03-20 NOTE — Progress Notes (Signed)
Location:  [facility] Room Number: 104-W Place of Service:  SNF (31)   CODE STATUS: DNR  No Known Allergies  Chief Complaint  Patient presents with  . Hospitalization Follow-up        HPI:  He is a 80 year old man with a prolonged hospitalization from 01-02-20 through 03-19-20. He has a history of CVA in 10/21 with dysarthria; hypertension; spinal stenosis. He [facility] with confusion and difficulty breathing. He was found to have right sided loculate pleural effusion with concern for empyema. IR at [facility] attempted an ultrasound guided chest tube placement; were unable; due to thickness of pleural effusion. He was transferred to [facility]. He had a right chest tube placed on 120-3-21 and was removed on 01-16-20.  He was treated for acute hypoxemic renal failure secondary to right sided parapneumonic effusion, empyema. He was treated with IV zosyn and transitioned to augementin which was completed on 02-17-20.  He has physical debilitation. The hospital was unable to contact his family for discharge; social services was contacted and has began guardianship process for him. He is here for short term rehab. This does represent a long term placement for him. He denies any pain; no cough or shortness of breath; no changes in appetite. He will continue to be followed for his chronic illnesses including:  Dementia associated with other underlying disease without behavioral disturbance:   Spinal stenosis lumbar region with neurogenic claudication:  Severe protein calorie malnutrition     Past Medical History:  Diagnosis Date  . Back pain   . Renal disorder    kidney stones  . Spinal stenosis, lumbar region, with neurogenic claudication 07/07/2014    Past Surgical History:  Procedure Laterality Date  . CATARACT EXTRACTION W/PHACO Left 08/22/2016   Procedure: CATARACT EXTRACTION PHACO AND INTRAOCULAR LENS PLACEMENT ([place]) Left;  Surgeon: [name],  MD;  Location: [facility];  Service: Ophthalmology;  Laterality: Left;  . CATARACT EXTRACTION W/PHACO Right 10/25/2016   Procedure: CATARACT EXTRACTION PHACO AND INTRAOCULAR LENS PLACEMENT ([place]) WITH ISTENT RIGHT  ;  Surgeon: [name], MD;  Location: [facility];  Service: Ophthalmology;  Laterality: Right;  TOPICAL RIGHT  ISTENT   trabecular bypass stent was implanted/explanted.  . INGUINAL HERNIA REPAIR    . IR PERC PLEURAL DRAIN W/INDWELL CATH W/IMG GUIDE  01/03/2020  . KNEE SURGERY Right   . NECK SURGERY      Social History   Socioeconomic History  . Marital status: Widowed    Spouse name: Not on file  . Number of children: Not on file  . Years of education: Not on file  . Highest education level: Not on file  Occupational History  . Not on file  Tobacco Use  . Smoking status: Current Every Day Smoker    Packs/day: 0.75    Types: Cigarettes  . Smokeless tobacco: Never Used  Vaping Use  . Vaping Use: Never used  Substance and Sexual Activity  . Alcohol use: No  . Drug use: Never  . Sexual activity: Not on file  Other Topics Concern  . Not on file  Social History Narrative  . Not on file   Social Determinants of Health   Financial Resource Strain: Not on file  Food Insecurity: Not on file  Transportation Needs: Not on file  Physical Activity: Not on file  Stress: Not on file  Social Connections: Not on file  Intimate Partner Violence: Not  on file   Family History  Problem Relation Age of Onset  . Diabetes Mother   . Diabetes Brother   . Diabetes Sister       VITAL SIGNS BP 129/68   Pulse 70   Temp 98 F (36.7 C)   Ht 5\' 10"  (1.778 m)   Wt 147 lb (66.7 kg)   SpO2 95%   BMI 21.09 kg/m   Outpatient Encounter Medications as of 03/20/2020  Medication Sig  . acetaminophen (TYLENOL) 325 MG tablet Take 2 tablets (650 mg total) by mouth every 6 (six) hours as needed for mild pain, fever or headache.  . ARTIFICIAL TEARS 0.2-0.2-1  % SOLN Place 2 drops into both eyes in the morning and at bedtime.   [name] aspirin EC 81 MG tablet Take 81 mg by mouth daily. Swallow whole.  Roseanne Kaufman [place]-Castor Oil (VENELEX) OINT Apply 1 application topically as directed. Apply to sacrum, coccyx and bilateral buttocks qshift  . clopidogrel (PLAVIX) 75 MG tablet Take 1 tablet (75 mg total) by mouth daily.  . DULoxetine (CYMBALTA) 60 MG capsule Take 1 capsule (60 mg total) by mouth daily.  . feeding supplement, ENSURE ENLIVE, (ENSURE ENLIVE) LIQD Take 237 mLs by mouth 3 (three) times daily between meals.  . Multiple Vitamin (MULTIVITAMIN WITH MINERALS) TABS tablet Take 1 tablet by mouth daily.  . polyethylene glycol (MIRALAX / GLYCOLAX) 17 g packet Take 34 g by mouth 2 (two) times daily.  . rosuvastatin (CRESTOR) 10 MG tablet Take 1 tablet (10 mg total) by mouth daily.   No facility-administered encounter medications on file as of 03/20/2020.     SIGNIFICANT DIAGNOSTIC EXAMS  TODAY;   01-01-20; ct of chest:  1. Moderate-size loculated right pleural effusion, which may reflect an empyema. Most of the right lower lobe is opacified consistent with pneumonia, atelectasis or a combination. 2. Additional mild dependent linear/reticular atelectasis in the right upper and middle lobes and left lower lobe. Trace left pleural effusion. 3. No visualized centrally obstructing mass. 4. Emphysema and mild aortic atherosclerosis. Aortic Atherosclerosis and Emphysema   03-02-20: ct of head;  1. No acute intracranial abnormality. 2. Expected evolution of right corona radiata lacunar infarct from prior exam. Remote lacunar infarct in the right thalamus. 3. Stable atrophy and chronic small vessel ischemia.   LABS REVIEWED TODAY;   01-01-20: wbc 15.9; hgb 11.5; hct 37.3; mcv 88.3 plt 319; glucose 119; bun 19; creat 0.87; k+ 4.2; na++ 136; ca 8.3 GFR>60 01-26-20: wbc 10.4; hgb 10.9; hct 33.6; mcv 81.4 plt 273; glucose 83; bun 18; creat 1.00; k+ 4.6; na++ 136;  ca 8.6 GFR>60    Review of Systems  Constitutional: Negative for malaise/fatigue.  Respiratory: Negative for cough and shortness of breath.   Cardiovascular: Negative for chest pain, palpitations and leg swelling.  Gastrointestinal: Negative for abdominal pain, constipation and heartburn.  Musculoskeletal: Negative for back pain, joint pain and myalgias.  Skin: Negative.   Neurological: Negative for dizziness.  Psychiatric/Behavioral: The patient is not nervous/anxious.     Physical Exam Constitutional:      General: He is not in acute distress.    Appearance: He is underweight and well-nourished. He is not diaphoretic.  Neck:     Thyroid: No thyromegaly.  Cardiovascular:     Rate and Rhythm: Normal rate and regular rhythm.     Pulses: Normal pulses and intact distal pulses.     Heart sounds: Normal heart sounds.  Pulmonary:     Effort:  Pulmonary effort is normal. No respiratory distress.     Breath sounds: Normal breath sounds.  Abdominal:     General: Bowel sounds are normal. There is no distension.     Palpations: Abdomen is soft.     Tenderness: There is no abdominal tenderness.  Musculoskeletal:        General: No edema.     Cervical back: Neck supple.     Right lower leg: No edema.     Left lower leg: No edema.     Comments: Is able to move all extremities  Right hand contracted   Lymphadenopathy:     Cervical: No cervical adenopathy.  Skin:    General: Skin is warm and dry.  Neurological:     Mental Status: He is alert and oriented to person, place, and time.     Comments: Has hesitant speech   Psychiatric:        Mood and Affect: Mood and affect and mood normal.        ASSESSMENT/ PLAN:  TODAY  1. Cerebrovascular accident (CVA) unspecified mechanism: is stable will continue asa 81 mg daily and plavix 75 mg daily  2. Empyema / acute hypoxemic respiratory failure: is stable has completed ABT; is on room air. Will monitor her status.   3. Dementia  associated with other underlying disease without behavioral disturbance: is without change weight is 147 pounds will continue to monitor his status.   4. Spinal stenosis lumbar region with neurogenic claudication: is stable will continue prn tylenol and cymbalta 60 mg daily   5. Severe protein calorie malnutrition: is stable weight is 147 pounds albumin 2.1 will continue ensure three times daily   6. Chronic constipation: is stable will continue miralax 34 gm twice daily   7. Hyperlipidemia LDL goal <100: is stable will continue crestor 10 mg daily   8. Major depression recurrent chronic: is stable will continue cymbalta 60 mg daily   9. Physical deconditioning: will continue pt/ot as indicated to improve upon his independence with his adls.   10. Primary hypertension: is stable wb/p 129/68 will monitor   11. PAD (peripheral artery disease) is stable will continue plavix 75 mg daily   12. Aortic atherosclerosis (ct 01-01-20) will monitor        MD is aware of resident's narcotic use and is in agreement with current plan of care. We will attempt to wean resident as appropriate.  Ok Edwards NP [facility]  Contact [phone] Monday through Friday 8am- 5pm  After hours call [phone]

## 2020-03-23 DIAGNOSIS — R2681 Unsteadiness on feet: Secondary | ICD-10-CM | POA: Diagnosis not present

## 2020-03-23 DIAGNOSIS — I639 Cerebral infarction, unspecified: Secondary | ICD-10-CM | POA: Diagnosis not present

## 2020-03-23 DIAGNOSIS — M6281 Muscle weakness (generalized): Secondary | ICD-10-CM | POA: Diagnosis not present

## 2020-03-23 DIAGNOSIS — J9601 Acute respiratory failure with hypoxia: Secondary | ICD-10-CM | POA: Diagnosis not present

## 2020-03-23 DIAGNOSIS — R262 Difficulty in walking, not elsewhere classified: Secondary | ICD-10-CM | POA: Diagnosis not present

## 2020-03-24 DIAGNOSIS — J9601 Acute respiratory failure with hypoxia: Secondary | ICD-10-CM | POA: Diagnosis not present

## 2020-03-24 DIAGNOSIS — M6281 Muscle weakness (generalized): Secondary | ICD-10-CM | POA: Diagnosis not present

## 2020-03-24 DIAGNOSIS — R262 Difficulty in walking, not elsewhere classified: Secondary | ICD-10-CM | POA: Diagnosis not present

## 2020-03-24 DIAGNOSIS — I639 Cerebral infarction, unspecified: Secondary | ICD-10-CM | POA: Diagnosis not present

## 2020-03-24 DIAGNOSIS — R2681 Unsteadiness on feet: Secondary | ICD-10-CM | POA: Diagnosis not present

## 2020-03-25 DIAGNOSIS — M6281 Muscle weakness (generalized): Secondary | ICD-10-CM | POA: Diagnosis not present

## 2020-03-25 DIAGNOSIS — R2681 Unsteadiness on feet: Secondary | ICD-10-CM | POA: Diagnosis not present

## 2020-03-25 DIAGNOSIS — R262 Difficulty in walking, not elsewhere classified: Secondary | ICD-10-CM | POA: Diagnosis not present

## 2020-03-25 DIAGNOSIS — I639 Cerebral infarction, unspecified: Secondary | ICD-10-CM | POA: Diagnosis not present

## 2020-03-25 DIAGNOSIS — J9601 Acute respiratory failure with hypoxia: Secondary | ICD-10-CM | POA: Diagnosis not present

## 2020-03-26 ENCOUNTER — Non-Acute Institutional Stay (SKILLED_NURSING_FACILITY): Payer: Medicare HMO | Admitting: Internal Medicine

## 2020-03-26 ENCOUNTER — Encounter: Payer: Self-pay | Admitting: Internal Medicine

## 2020-03-26 DIAGNOSIS — D649 Anemia, unspecified: Secondary | ICD-10-CM

## 2020-03-26 DIAGNOSIS — J869 Pyothorax without fistula: Secondary | ICD-10-CM | POA: Diagnosis not present

## 2020-03-26 DIAGNOSIS — I10 Essential (primary) hypertension: Secondary | ICD-10-CM

## 2020-03-26 DIAGNOSIS — E43 Unspecified severe protein-calorie malnutrition: Secondary | ICD-10-CM | POA: Diagnosis not present

## 2020-03-26 DIAGNOSIS — L89159 Pressure ulcer of sacral region, unspecified stage: Secondary | ICD-10-CM | POA: Diagnosis not present

## 2020-03-26 DIAGNOSIS — M6281 Muscle weakness (generalized): Secondary | ICD-10-CM | POA: Diagnosis not present

## 2020-03-26 DIAGNOSIS — J9601 Acute respiratory failure with hypoxia: Secondary | ICD-10-CM | POA: Diagnosis not present

## 2020-03-26 DIAGNOSIS — I639 Cerebral infarction, unspecified: Secondary | ICD-10-CM | POA: Diagnosis not present

## 2020-03-26 DIAGNOSIS — R262 Difficulty in walking, not elsewhere classified: Secondary | ICD-10-CM | POA: Diagnosis not present

## 2020-03-26 DIAGNOSIS — R2681 Unsteadiness on feet: Secondary | ICD-10-CM | POA: Diagnosis not present

## 2020-03-26 NOTE — Assessment & Plan Note (Addendum)
Wound Care Nurse at SNF will monitor

## 2020-03-26 NOTE — Assessment & Plan Note (Signed)
Baseline hemoglobin was felt to be approximately 10.  On 02/08/2020 hemoglobin 12.7/hematocrit 39.9.

## 2020-03-26 NOTE — Assessment & Plan Note (Signed)
BP controlled; no change in antihypertensive medications  

## 2020-03-26 NOTE — Patient Instructions (Signed)
See assessment and plan under each diagnosis in the problem list and acutely for this visit 

## 2020-03-26 NOTE — Progress Notes (Signed)
NURSING HOME LOCATION: [place] ROOM NUMBER: 104/W   CODE STATUS:DNR    CNO:[id number], Malka So, MD    This is a comprehensive admission note to [facility] performed on this date less than 30 days from date of admission. Included are preadmission medical/surgical history; reconciled medication list; family history; social history and comprehensive review of systems.  Corrections and additions to the records were documented. Comprehensive physical exam was also performed. Additionally a clinical summary was entered for each active diagnosis pertinent to this admission in the Problem List to enhance continuity of care.  HPI: Patient was hospitalized 01/02/2020 -03/19/2020 presenting to [facility] from [facility] SNF with confusion and difficulty breathing.  Work-up revealed right-sided loculated pleural effusion with concern for empyema.  IR at [facility] attempted but could not complete  ultrasound-guided chest tube placement. fluid.  General Surgery consultant at [facility] recommended transfer to [facility] for thoracic surgery evaluation. At [facility] Cardiothoracic Surgery consulted IR and pigtail chest tube placement was completed 01/03/2020.   ID recommended transition from IV Zosyn to IV ceftriaxone and oral  Augmentin for a total of 6 weeks with a start date of 01/03/2020.  The chest tube was removed 12/16. TCTS signed off 12/17. Hospital course was complicated by acute hypoxic respiratory failure and AKI.  Chronic anemia was present with hemoglobin of approximately 10. Sacral pressure injury was documented 02/12/2020 with partial thickness loss of dermis with shallow open injury. Because of physical deconditioning and severe protein/caloric malnutrition SNF placement was recommended.  The protein caloric malnutrition was associated with moderate-severe fat depletion and severe muscle depletion.  Transfer was delayed due to lack of insurance coverage and ability to obtain funding.  Social  services were unable to contact his [name] who resides in the patient's home.  They were seeking to determine whether he would be able to provide 24/7 care for the patient.  As they were unable to contact him; [place] was to pursue guardianship proceedings. The patient has a history of stroke with dementia without behavioral disturbances.  Incontinence of bowel and bladder was documented while hospitalized; the patient was unaware of such. Cardiothoracic surgery follow-up with Dr. Prescott Gum was to be 1-2 weeks after admission to the SNF.  Past medical and surgical history: Includes essential hypertension, dyslipidemia, history of nephrolithiasis, and history of spinal stenosis with neurogenic claudication. Surgeries and procedures include inguinal hernia repair and neck and knee surgery.  Social history: Nondrinker, former smoker up to 3/4 pack/day.  Family history: Strong family history of diabetes.   Review of systems: Despite he stating date as " March 25, 2020"; history is unreliable. He states he was hospitalized because he "was falling because my house is up on a hill and my son wouldn't park at bottom". He had no knowledge of the empyema & other issues. He denies any active symptoms.  Constitutional: No fever, significant weight change, fatigue  Eyes: No redness, discharge, pain, vision change ENT/mouth: No nasal congestion, purulent discharge, earache, change in hearing, sore throat  Cardiovascular: No chest pain, palpitations, paroxysmal nocturnal dyspnea, claudication, edema  Respiratory: No cough, sputum production, hemoptysis, DOE, significant snoring, apnea Gastrointestinal: No heartburn, dysphagia, abdominal pain, nausea /vomiting, rectal bleeding, melena, change in bowels Genitourinary: No dysuria, hematuria, pyuria, incontinence, nocturia Musculoskeletal: No joint stiffness, joint swelling, pain Dermatologic: No rash, pruritus, change in appearance of  skin Neurologic: No dizziness, headache, syncope, seizures, numbness, tingling Psychiatric: No significant anxiety, depression, insomnia, anorexia Endocrine: No  change in hair/skin/nails, excessive thirst, excessive hunger, excessive urination  Hematologic/lymphatic: No significant bruising, lymphadenopathy, abnormal bleeding Allergy/immunology: No itchy/watery eyes, significant sneezing, urticaria, angioedema  Physical exam:  Pertinent or positive findings: He appears chronically. Speech is rambling and unfocused.Sclerae are muddy.Arcus senilis present.OD exotropia present. Extremely poor dentition with decay and erosions noted.Decreased breath sounds. Heart sounds distant , heard best in epigastrium. Rhythm slightly irregular.1/2+ edema present.Decreased pedal pulses.Limbs atrophic; interosseous wasting.Diffusely weak; UE stronger than LE. Slight tremor of RUE while feeding self.  General appearance:  no acute distress, increased work of breathing is present.   Lymphatic: No lymphadenopathy about the head, neck, axilla. Eyes: No conjunctival inflammation or lid edema is present. There is no scleral icterus. Ears:  External ear exam shows no significant lesions or deformities.   Nose:  External nasal examination shows no deformity or inflammation. Nasal mucosa are pink and moist without lesions, exudates Neck:  No thyromegaly, masses, tenderness noted.    Heart:  No gallop, murmur, click, rub.  Lungs:  without wheezes, rhonchi, rales, rubs. Abdomen: Bowel sounds are normal.  Abdomen is soft and nontender with no organomegaly, hernias, masses. GU: Deferred  Extremities:  No cyanosis, clubbing. Neurologic exam: Balance, Rhomberg, finger to nose testing could not be completed due to clinical state Skin: Warm & dry w/o tenting. No significant lesions or rash.  See clinical summary under each active problem in the Problem List with associated updated therapeutic plan

## 2020-03-26 NOTE — Assessment & Plan Note (Addendum)
01/26/2020 albumin 2.1/total protein 6.5.  Nutrition consult at SNF.

## 2020-03-27 ENCOUNTER — Non-Acute Institutional Stay (SKILLED_NURSING_FACILITY): Payer: Medicare HMO | Admitting: Adult Health

## 2020-03-27 DIAGNOSIS — J9601 Acute respiratory failure with hypoxia: Secondary | ICD-10-CM

## 2020-03-27 DIAGNOSIS — F0280 Dementia in other diseases classified elsewhere without behavioral disturbance: Secondary | ICD-10-CM

## 2020-03-27 DIAGNOSIS — M6281 Muscle weakness (generalized): Secondary | ICD-10-CM | POA: Diagnosis not present

## 2020-03-27 DIAGNOSIS — R262 Difficulty in walking, not elsewhere classified: Secondary | ICD-10-CM | POA: Diagnosis not present

## 2020-03-27 DIAGNOSIS — I639 Cerebral infarction, unspecified: Secondary | ICD-10-CM

## 2020-03-27 DIAGNOSIS — R2681 Unsteadiness on feet: Secondary | ICD-10-CM | POA: Diagnosis not present

## 2020-03-27 DIAGNOSIS — J869 Pyothorax without fistula: Secondary | ICD-10-CM

## 2020-03-28 DIAGNOSIS — I639 Cerebral infarction, unspecified: Secondary | ICD-10-CM | POA: Diagnosis not present

## 2020-03-28 DIAGNOSIS — M6281 Muscle weakness (generalized): Secondary | ICD-10-CM | POA: Diagnosis not present

## 2020-03-28 DIAGNOSIS — R262 Difficulty in walking, not elsewhere classified: Secondary | ICD-10-CM | POA: Diagnosis not present

## 2020-03-28 DIAGNOSIS — J9601 Acute respiratory failure with hypoxia: Secondary | ICD-10-CM | POA: Diagnosis not present

## 2020-03-28 DIAGNOSIS — R2681 Unsteadiness on feet: Secondary | ICD-10-CM | POA: Diagnosis not present

## 2020-03-29 DIAGNOSIS — I639 Cerebral infarction, unspecified: Secondary | ICD-10-CM | POA: Diagnosis not present

## 2020-03-29 DIAGNOSIS — R262 Difficulty in walking, not elsewhere classified: Secondary | ICD-10-CM | POA: Diagnosis not present

## 2020-03-29 DIAGNOSIS — R2681 Unsteadiness on feet: Secondary | ICD-10-CM | POA: Diagnosis not present

## 2020-03-29 DIAGNOSIS — J9601 Acute respiratory failure with hypoxia: Secondary | ICD-10-CM | POA: Diagnosis not present

## 2020-03-29 DIAGNOSIS — M6281 Muscle weakness (generalized): Secondary | ICD-10-CM | POA: Diagnosis not present

## 2020-03-29 NOTE — Assessment & Plan Note (Signed)
Excellent O2 sats ; no increased WOB.

## 2020-03-30 ENCOUNTER — Encounter: Payer: Self-pay | Admitting: Adult Health

## 2020-03-30 DIAGNOSIS — I639 Cerebral infarction, unspecified: Secondary | ICD-10-CM | POA: Diagnosis not present

## 2020-03-30 DIAGNOSIS — R2681 Unsteadiness on feet: Secondary | ICD-10-CM | POA: Diagnosis not present

## 2020-03-30 DIAGNOSIS — R262 Difficulty in walking, not elsewhere classified: Secondary | ICD-10-CM | POA: Diagnosis not present

## 2020-03-30 DIAGNOSIS — J9601 Acute respiratory failure with hypoxia: Secondary | ICD-10-CM | POA: Diagnosis not present

## 2020-03-30 DIAGNOSIS — M6281 Muscle weakness (generalized): Secondary | ICD-10-CM | POA: Diagnosis not present

## 2020-03-30 NOTE — Progress Notes (Signed)
Location:  [place] Room Number: 161 Place of Service:  SNF (31)   CODE STATUS: DNR  No Known Allergies  Chief Complaint  Patient presents with  . Medical Management of Chronic Issues         Cerebrovascular accident (CVA) unspecified mechanism:   Empyema / acute hypoxemic respiratory failure:  Dementia associated with other underlying disease without behavioral disturbance weekly follow up for the first 30 days post hospitalization.     HPI:  He is a 80 year old short term rehab patient being seen for the management of his chronic illnesses: Cerebrovascular accident (CVA) unspecified mechanism:   Empyema / acute hypoxemic respiratory failure:  Dementia associated with other underlying disease without behavioral disturbance:  He continues to participate in therapy. There are no reports of uncontrolled pain; his appetite is without change. He does socialize with others. There are no reports of insomnia present.   Past Medical History:  Diagnosis Date  . Back pain   . Renal disorder    kidney stones  . Spinal stenosis, lumbar region, with neurogenic claudication 07/07/2014    Past Surgical History:  Procedure Laterality Date  . CATARACT EXTRACTION W/PHACO Left 08/22/2016   Procedure: CATARACT EXTRACTION PHACO AND INTRAOCULAR LENS PLACEMENT ([place]) Left;  Surgeon: [name], MD;  Location: [place];  Service: Ophthalmology;  Laterality: Left;  . CATARACT EXTRACTION W/PHACO Right 10/25/2016   Procedure: CATARACT EXTRACTION PHACO AND INTRAOCULAR LENS PLACEMENT ([place]) WITH ISTENT RIGHT  ;  Surgeon: [name], MD;  Location: [place];  Service: Ophthalmology;  Laterality: Right;  TOPICAL RIGHT  ISTENT   trabecular bypass stent was implanted/explanted.  . INGUINAL HERNIA REPAIR    . IR PERC PLEURAL DRAIN W/INDWELL CATH W/IMG GUIDE  01/03/2020  . KNEE SURGERY Right   . NECK SURGERY      Social History   Socioeconomic  History  . Marital status: Widowed    Spouse name: Not on file  . Number of children: Not on file  . Years of education: Not on file  . Highest education level: Not on file  Occupational History  . Not on file  Tobacco Use  . Smoking status: Current Every Day Smoker    Packs/day: 0.75    Types: Cigarettes  . Smokeless tobacco: Never Used  Vaping Use  . Vaping Use: Never used  Substance and Sexual Activity  . Alcohol use: No  . Drug use: Never  . Sexual activity: Not on file  Other Topics Concern  . Not on file  Social History Narrative  . Not on file   Social Determinants of Health   Financial Resource Strain: Not on file  Food Insecurity: Not on file  Transportation Needs: Not on file  Physical Activity: Not on file  Stress: Not on file  Social Connections: Not on file  Intimate Partner Violence: Not on file   Family History  Problem Relation Age of Onset  . Diabetes Mother   . Diabetes Brother   . Diabetes Sister       VITAL SIGNS BP 132/84   Pulse 78   Temp 98.2 F (36.8 C)   Ht 5\' 10"  (1.778 m)   Wt 134 lb (60.8 kg)   BMI 19.23 kg/m   Outpatient Encounter Medications as of 03/27/2020  Medication Sig  . acetaminophen (TYLENOL) 325 MG tablet Take 2 tablets (650 mg total) by mouth every 6 (six) hours as needed for mild  pain, fever or headache.  . ARTIFICIAL TEARS 0.2-0.2-1 % SOLN Place 2 drops into both eyes in the morning and at bedtime.   [name] aspirin EC 81 MG tablet Take 81 mg by mouth daily. Swallow whole.  Roseanne Kaufman Peru-Castor Oil (VENELEX) OINT Apply 1 application topically as directed. Apply to sacrum, coccyx and bilateral buttocks qshift  . clopidogrel (PLAVIX) 75 MG tablet Take 1 tablet (75 mg total) by mouth daily.  . DULoxetine (CYMBALTA) 60 MG capsule Take 1 capsule (60 mg total) by mouth daily.  . feeding supplement, ENSURE ENLIVE, (ENSURE ENLIVE) LIQD Take 237 mLs by mouth 3 (three) times daily between meals.  . Multiple Vitamin (MULTIVITAMIN  WITH MINERALS) TABS tablet Take 1 tablet by mouth daily.  . NON FORMULARY Diet NAS  . polyethylene glycol (MIRALAX / GLYCOLAX) 17 g packet Take 34 g by mouth 2 (two) times daily.  . rosuvastatin (CRESTOR) 10 MG tablet Take 1 tablet (10 mg total) by mouth daily.   No facility-administered encounter medications on file as of 03/27/2020.     SIGNIFICANT DIAGNOSTIC EXAMS  PREVIOUS    01-01-20; ct of chest:  1. Moderate-size loculated right pleural effusion, which may reflect an empyema. Most of the right lower lobe is opacified consistent with pneumonia, atelectasis or a combination. 2. Additional mild dependent linear/reticular atelectasis in the right upper and middle lobes and left lower lobe. Trace left pleural effusion. 3. No visualized centrally obstructing mass. 4. Emphysema and mild aortic atherosclerosis. Aortic Atherosclerosis and Emphysema   03-02-20: ct of head;  1. No acute intracranial abnormality. 2. Expected evolution of right corona radiata lacunar infarct from prior exam. Remote lacunar infarct in the right thalamus. 3. Stable atrophy and chronic small vessel ischemia.  NO NEW EXAMS.    LABS REVIEWED PREVIOUS   01-01-20: wbc 15.9; hgb 11.5; hct 37.3; mcv 88.3 plt 319; glucose 119; bun 19; creat 0.87; k+ 4.2; na++ 136; ca 8.3 GFR>60 01-26-20: wbc 10.4; hgb 10.9; hct 33.6; mcv 81.4 plt 273; glucose 83; bun 18; creat 1.00; k+ 4.6; na++ 136; ca 8.6 GFR>60   TODAY  02-08-20: wbc 10.4; hgb 12.7; hct 39.9; mcv 81.9 plt 250; glucose 107; bun 20; creat 0.95; k+ 4.6 na++ 136; ca 8.6; GFR>60   Review of Systems  Constitutional: Negative for malaise/fatigue.  Respiratory: Negative for cough and shortness of breath.   Cardiovascular: Negative for chest pain, palpitations and leg swelling.  Gastrointestinal: Negative for abdominal pain, constipation and heartburn.  Musculoskeletal: Negative for back pain, joint pain and myalgias.  Skin: Negative.   Neurological: Negative for  dizziness.  Psychiatric/Behavioral: The patient is not nervous/anxious.     Physical Exam Constitutional:      General: He is not in acute distress.    Appearance: He is underweight and well-nourished. He is not diaphoretic.  Neck:     Thyroid: No thyromegaly.  Cardiovascular:     Rate and Rhythm: Normal rate and regular rhythm.     Pulses: Normal pulses and intact distal pulses.     Heart sounds: Normal heart sounds.  Pulmonary:     Effort: Pulmonary effort is normal. No respiratory distress.     Breath sounds: Normal breath sounds.  Abdominal:     General: Bowel sounds are normal. There is no distension.     Palpations: Abdomen is soft.     Tenderness: There is no abdominal tenderness.  Musculoskeletal:        General: No edema.  Cervical back: Neck supple.     Right lower leg: No edema.     Left lower leg: No edema.     Comments:  Is able to move all extremities  Right hand contracted    Lymphadenopathy:     Cervical: No cervical adenopathy.  Skin:    General: Skin is warm and dry.  Neurological:     Mental Status: He is alert. Mental status is at baseline.  Psychiatric:        Mood and Affect: Mood and affect and mood normal.      ASSESSMENT/ PLAN:  TODAY  1. Cerebrovascular accident (CVA) unspecified mechanism: is stable will continue asa 81 mg daily and plavix 75 mg daily   2. Empyema / acute hypoxemic respiratory failure: is stable has complete abt and is on room air will monitor his status.   3. Dementia associated with other underlying disease without behavioral disturbance: is stable weight is 134 pounds will continue to monitor his status.   PREVIOUS   4. Spinal stenosis lumbar region with neurogenic claudication: is stable will continue prn tylenol and cymbalta 60 mg daily   5. Severe protein calorie malnutrition: is stable weight is 134 pounds albumin 2.1 will continue ensure three times daily   6. Chronic constipation: is stable will continue  miralax 34 gm twice daily   7. Hyperlipidemia LDL goal <100: is stable will continue crestor 10 mg daily   8. Major depression recurrent chronic: is stable will continue cymbalta 60 mg daily   9. Physical deconditioning: will continue pt/ot as indicated to improve upon his independence with his adls.   10. Primary hypertension: is stable wb/p 132/84 will monitor   11. PAD (peripheral artery disease) is stable will continue plavix 75 mg daily   12. Aortic atherosclerosis (ct 01-01-20) will monitor          MD is aware of resident's narcotic use and is in agreement with current plan of care. We will attempt to wean resident as appropriate.  Ok Edwards NP [facility]  Contact [phone] Monday through Friday 8am- 5pm  After hours call [phone]

## 2020-03-31 DIAGNOSIS — I639 Cerebral infarction, unspecified: Secondary | ICD-10-CM | POA: Diagnosis not present

## 2020-03-31 DIAGNOSIS — R262 Difficulty in walking, not elsewhere classified: Secondary | ICD-10-CM | POA: Diagnosis not present

## 2020-03-31 DIAGNOSIS — R2681 Unsteadiness on feet: Secondary | ICD-10-CM | POA: Diagnosis not present

## 2020-03-31 DIAGNOSIS — M6281 Muscle weakness (generalized): Secondary | ICD-10-CM | POA: Diagnosis not present

## 2020-03-31 DIAGNOSIS — J9601 Acute respiratory failure with hypoxia: Secondary | ICD-10-CM | POA: Diagnosis not present

## 2020-03-31 DIAGNOSIS — R41841 Cognitive communication deficit: Secondary | ICD-10-CM | POA: Diagnosis not present

## 2020-04-01 DIAGNOSIS — M25552 Pain in left hip: Secondary | ICD-10-CM | POA: Diagnosis not present

## 2020-04-01 DIAGNOSIS — R2681 Unsteadiness on feet: Secondary | ICD-10-CM | POA: Diagnosis not present

## 2020-04-01 DIAGNOSIS — M1612 Unilateral primary osteoarthritis, left hip: Secondary | ICD-10-CM | POA: Diagnosis not present

## 2020-04-01 DIAGNOSIS — W19XXXA Unspecified fall, initial encounter: Secondary | ICD-10-CM | POA: Diagnosis not present

## 2020-04-01 DIAGNOSIS — R262 Difficulty in walking, not elsewhere classified: Secondary | ICD-10-CM | POA: Diagnosis not present

## 2020-04-01 DIAGNOSIS — J9601 Acute respiratory failure with hypoxia: Secondary | ICD-10-CM | POA: Diagnosis not present

## 2020-04-01 DIAGNOSIS — M7989 Other specified soft tissue disorders: Secondary | ICD-10-CM | POA: Diagnosis not present

## 2020-04-01 DIAGNOSIS — M6281 Muscle weakness (generalized): Secondary | ICD-10-CM | POA: Diagnosis not present

## 2020-04-01 DIAGNOSIS — R41841 Cognitive communication deficit: Secondary | ICD-10-CM | POA: Diagnosis not present

## 2020-04-01 DIAGNOSIS — I639 Cerebral infarction, unspecified: Secondary | ICD-10-CM | POA: Diagnosis not present

## 2020-04-02 ENCOUNTER — Encounter: Payer: Self-pay | Admitting: Adult Health

## 2020-04-02 ENCOUNTER — Non-Acute Institutional Stay (SKILLED_NURSING_FACILITY): Payer: Medicare HMO | Admitting: Adult Health

## 2020-04-02 ENCOUNTER — Other Ambulatory Visit: Payer: Self-pay | Admitting: Thoracic Surgery (Cardiothoracic Vascular Surgery)

## 2020-04-02 DIAGNOSIS — F0280 Dementia in other diseases classified elsewhere without behavioral disturbance: Secondary | ICD-10-CM | POA: Diagnosis not present

## 2020-04-02 DIAGNOSIS — J9601 Acute respiratory failure with hypoxia: Secondary | ICD-10-CM | POA: Diagnosis not present

## 2020-04-02 DIAGNOSIS — E43 Unspecified severe protein-calorie malnutrition: Secondary | ICD-10-CM

## 2020-04-02 DIAGNOSIS — M6281 Muscle weakness (generalized): Secondary | ICD-10-CM | POA: Diagnosis not present

## 2020-04-02 DIAGNOSIS — R2681 Unsteadiness on feet: Secondary | ICD-10-CM | POA: Diagnosis not present

## 2020-04-02 DIAGNOSIS — I639 Cerebral infarction, unspecified: Secondary | ICD-10-CM | POA: Diagnosis not present

## 2020-04-02 DIAGNOSIS — J869 Pyothorax without fistula: Secondary | ICD-10-CM

## 2020-04-02 DIAGNOSIS — R41841 Cognitive communication deficit: Secondary | ICD-10-CM | POA: Diagnosis not present

## 2020-04-02 DIAGNOSIS — R262 Difficulty in walking, not elsewhere classified: Secondary | ICD-10-CM | POA: Diagnosis not present

## 2020-04-02 NOTE — Progress Notes (Signed)
Location:  [place] Room Number: 104/W Place of Service:  SNF (31)   CODE STATUS: DNR  No Known Allergies  Chief Complaint  Patient presents with  . Acute Visit    Care Plan Meeting     HPI:  We have come together for his care plan meeting  [facility] 7/15 mood 3/30. He requires limited to extensive assist with his adls. He is able to feed himself. He is frequently incontinent of bladder and bowel. He has had 2 falls #1 2/23 no injury; #2 2/28 with hip pain no injury. Weight:141.2 pounds; has a good appetite.  therapy ambulating 50 feet with walker; requires assist with mobility.   He will continue to be followed for his chronic illnesses including: Cerebrovascular accident (CVA) unspecified mechanism   Dementia associated with underlying disease without behavioral disturbance  Severe protein calorie malnutrition  Past Medical History:  Diagnosis Date  . Back pain   . Renal disorder    kidney stones  . Spinal stenosis, lumbar region, with neurogenic claudication 07/07/2014    Past Surgical History:  Procedure Laterality Date  . CATARACT EXTRACTION W/PHACO Left 08/22/2016   Procedure: CATARACT EXTRACTION PHACO AND INTRAOCULAR LENS PLACEMENT ([place]) Left;  Surgeon: [name], MD;  Location: [place];  Service: Ophthalmology;  Laterality: Left;  . CATARACT EXTRACTION W/PHACO Right 10/25/2016   Procedure: CATARACT EXTRACTION PHACO AND INTRAOCULAR LENS PLACEMENT ([place]) WITH ISTENT RIGHT  ;  Surgeon: [name], MD;  Location: [place];  Service: Ophthalmology;  Laterality: Right;  TOPICAL RIGHT  ISTENT   trabecular bypass stent was implanted/explanted.  . INGUINAL HERNIA REPAIR    . IR PERC PLEURAL DRAIN W/INDWELL CATH W/IMG GUIDE  01/03/2020  . KNEE SURGERY Right   . NECK SURGERY      Social History   Socioeconomic History  . Marital status: Widowed    Spouse name: Not on file  . Number of children: Not on file  . Years  of education: Not on file  . Highest education level: Not on file  Occupational History  . Not on file  Tobacco Use  . Smoking status: Current Every Day Smoker    Packs/day: 0.75    Types: Cigarettes  . Smokeless tobacco: Never Used  Vaping Use  . Vaping Use: Never used  Substance and Sexual Activity  . Alcohol use: No  . Drug use: Never  . Sexual activity: Not on file  Other Topics Concern  . Not on file  Social History Narrative  . Not on file   Social Determinants of Health   Financial Resource Strain: Not on file  Food Insecurity: Not on file  Transportation Needs: Not on file  Physical Activity: Not on file  Stress: Not on file  Social Connections: Not on file  Intimate Partner Violence: Not on file   Family History  Problem Relation Age of Onset  . Diabetes Mother   . Diabetes Brother   . Diabetes Sister       VITAL SIGNS BP 136/82   Pulse 76   Temp 98.2 F (36.8 C)   Resp 20   Ht 5\' 5"  (1.651 m)   Wt 141 lb 3.2 oz (64 kg)   SpO2 97%   BMI 23.50 kg/m   Outpatient Encounter Medications as of 04/02/2020  Medication Sig  . acetaminophen (TYLENOL) 325 MG tablet Take 2 tablets (650 mg total) by mouth every 6 (six) hours as needed for mild  pain, fever or headache.  . ARTIFICIAL TEARS 0.2-0.2-1 % SOLN Place 2 drops into both eyes in the morning and at bedtime.   [name] aspirin EC 81 MG tablet Take 81 mg by mouth daily. Swallow whole.  Roseanne Kaufman [place]-Castor Oil (VENELEX) OINT Apply 1 application topically as directed. Apply to sacrum, coccyx and bilateral buttocks qshift  . clopidogrel (PLAVIX) 75 MG tablet Take 1 tablet (75 mg total) by mouth daily.  . DULoxetine (CYMBALTA) 60 MG capsule Take 1 capsule (60 mg total) by mouth daily.  . feeding supplement, ENSURE ENLIVE, (ENSURE ENLIVE) LIQD Take 237 mLs by mouth 3 (three) times daily between meals.  . Multiple Vitamin (MULTIVITAMIN WITH MINERALS) TABS tablet Take 1 tablet by mouth daily.  . NON FORMULARY Diet NAS   . polyethylene glycol (MIRALAX / GLYCOLAX) 17 g packet Take 34 g by mouth 2 (two) times daily.  . rosuvastatin (CRESTOR) 10 MG tablet Take 1 tablet (10 mg total) by mouth daily.   No facility-administered encounter medications on file as of 04/02/2020.     SIGNIFICANT DIAGNOSTIC EXAMS   PREVIOUS    01-01-20; ct of chest:  1. Moderate-size loculated right pleural effusion, which may reflect an empyema. Most of the right lower lobe is opacified consistent with pneumonia, atelectasis or a combination. 2. Additional mild dependent linear/reticular atelectasis in the right upper and middle lobes and left lower lobe. Trace left pleural effusion. 3. No visualized centrally obstructing mass. 4. Emphysema and mild aortic atherosclerosis. Aortic Atherosclerosis and Emphysema   03-02-20: ct of head;  1. No acute intracranial abnormality. 2. Expected evolution of right corona radiata lacunar infarct from prior exam. Remote lacunar infarct in the right thalamus. 3. Stable atrophy and chronic small vessel ischemia.  NO NEW EXAMS.    LABS REVIEWED PREVIOUS   01-01-20: wbc 15.9; hgb 11.5; hct 37.3; mcv 88.3 plt 319; glucose 119; bun 19; creat 0.87; k+ 4.2; na++ 136; ca 8.3 GFR>60 01-26-20: wbc 10.4; hgb 10.9; hct 33.6; mcv 81.4 plt 273; glucose 83; bun 18; creat 1.00; k+ 4.6; na++ 136; ca 8.6 GFR>60  02-08-20: wbc 10.4; hgb 12.7; hct 39.9; mcv 81.9 plt 250; glucose 107; bun 20; creat 0.95; k+ 4.6 na++ 136; ca 8.6; GFR>60  NO NEW LABS.   Review of Systems  Constitutional: Negative for malaise/fatigue.  Respiratory: Negative for cough and shortness of breath.   Cardiovascular: Negative for chest pain, palpitations and leg swelling.  Gastrointestinal: Negative for abdominal pain, constipation and heartburn.  Musculoskeletal: Negative for back pain, joint pain and myalgias.  Skin: Negative.   Neurological: Negative for dizziness.  Psychiatric/Behavioral: The patient is not nervous/anxious.      Physical Exam Constitutional:      General: He is not in acute distress.    Appearance: He is well-developed and well-nourished. He is not diaphoretic.  Neck:     Thyroid: No thyromegaly.  Cardiovascular:     Rate and Rhythm: Normal rate and regular rhythm.     Pulses: Normal pulses and intact distal pulses.     Heart sounds: Normal heart sounds.  Pulmonary:     Effort: Pulmonary effort is normal. No respiratory distress.     Breath sounds: Normal breath sounds.  Abdominal:     General: Bowel sounds are normal. There is no distension.     Palpations: Abdomen is soft.     Tenderness: There is no abdominal tenderness.  Musculoskeletal:        General: No edema.  Cervical back: Neck supple.     Right lower leg: No edema.     Left lower leg: No edema.     Comments: Is able to move all extremities  Right hand contracted     Lymphadenopathy:     Cervical: No cervical adenopathy.  Skin:    General: Skin is warm and dry.  Neurological:     Mental Status: He is alert. Mental status is at baseline.  Psychiatric:        Mood and Affect: Mood and affect and mood normal.      ASSESSMENT/ PLAN:  TODAY  1. Cerebrovascular accident (CVA) unspecified mechanism 2. Dementia associated with underlying disease without behavioral disturbance  3. Severe protein calorie malnutrition  Will continue current medications Will continue current plan of care Will continue therapy as directed At this time his end goal is yet to be determined    Ok Edwards NP [facility]  Contact [phone] Monday through Friday 8am- 5pm  After hours call [phone]

## 2020-04-03 ENCOUNTER — Ambulatory Visit (INDEPENDENT_AMBULATORY_CARE_PROVIDER_SITE_OTHER): Payer: Medicare HMO | Admitting: Thoracic Surgery (Cardiothoracic Vascular Surgery)

## 2020-04-03 ENCOUNTER — Inpatient Hospital Stay
Admit: 2020-04-03 | Discharge: 2020-04-03 | Disposition: A | Discharge status: Home / Self Care | Payer: Medicare HMO | Attending: Thoracic Surgery (Cardiothoracic Vascular Surgery) | Admitting: Thoracic Surgery (Cardiothoracic Vascular Surgery)

## 2020-04-03 VITALS — BP 159/90 | HR 73 | Temp 97.6°F | Resp 20 | Ht 65.0 in | Wt 141.0 lb

## 2020-04-03 DIAGNOSIS — J9601 Acute respiratory failure with hypoxia: Secondary | ICD-10-CM | POA: Diagnosis not present

## 2020-04-03 DIAGNOSIS — J90 Pleural effusion, not elsewhere classified: Secondary | ICD-10-CM | POA: Diagnosis not present

## 2020-04-03 DIAGNOSIS — J869 Pyothorax without fistula: Secondary | ICD-10-CM

## 2020-04-03 DIAGNOSIS — M6281 Muscle weakness (generalized): Secondary | ICD-10-CM | POA: Diagnosis not present

## 2020-04-03 DIAGNOSIS — R262 Difficulty in walking, not elsewhere classified: Secondary | ICD-10-CM | POA: Diagnosis not present

## 2020-04-03 DIAGNOSIS — J439 Emphysema, unspecified: Secondary | ICD-10-CM | POA: Diagnosis not present

## 2020-04-03 DIAGNOSIS — R2681 Unsteadiness on feet: Secondary | ICD-10-CM | POA: Diagnosis not present

## 2020-04-03 DIAGNOSIS — I639 Cerebral infarction, unspecified: Secondary | ICD-10-CM | POA: Diagnosis not present

## 2020-04-03 DIAGNOSIS — I700 Atherosclerosis of aorta: Secondary | ICD-10-CM | POA: Diagnosis not present

## 2020-04-03 DIAGNOSIS — J984 Other disorders of lung: Secondary | ICD-10-CM | POA: Diagnosis not present

## 2020-04-03 DIAGNOSIS — R41841 Cognitive communication deficit: Secondary | ICD-10-CM | POA: Diagnosis not present

## 2020-04-03 NOTE — Progress Notes (Signed)
      [address]       [place] [address]             [phone]        [name] [name] [place] Medical Record #[id number] Date of Birth: 1940/11/14  Referring: [name], [name], MD Primary Care: [name], [name], MD Primary Cardiologist:No primary care provider on file.  Reason for visit:   follow-up  History of Present Illness:     Patient comes in for hospital follow-up.  He was originally evaluated by Dr. Darcey Nora with right-sided parapneumonic effusion.  This was treated with an image guided chest tube and subsequently drained in December of last year.  He is currently staying at his assisted living facility.  This is his first follow-up.  He has no complaints and denies any shortness of breath.  Physical Exam: BP (!) 159/90   Pulse 73   Temp 97.6 F (36.4 C) (Skin)   Resp 20   Ht 5\' 5"  (1.651 m)   Wt 141 lb (64 kg)   SpO2 94% Comment: RA  BMI 23.46 kg/m   Alert NAD Abdomen soft, ND No peripheral edema   Diagnostic Studies & Laboratory data: CXR: Resolution of right-sided effusion.     Assessment / Plan:   80 year old male status post chest tube placement for loculated parapneumonic effusion.  Currently doing well.  Follow-up as needed.   [name] [name] 04/03/2020 4:15 PM

## 2020-04-04 DIAGNOSIS — R2681 Unsteadiness on feet: Secondary | ICD-10-CM | POA: Diagnosis not present

## 2020-04-04 DIAGNOSIS — M6281 Muscle weakness (generalized): Secondary | ICD-10-CM | POA: Diagnosis not present

## 2020-04-04 DIAGNOSIS — R41841 Cognitive communication deficit: Secondary | ICD-10-CM | POA: Diagnosis not present

## 2020-04-04 DIAGNOSIS — R262 Difficulty in walking, not elsewhere classified: Secondary | ICD-10-CM | POA: Diagnosis not present

## 2020-04-04 DIAGNOSIS — J9601 Acute respiratory failure with hypoxia: Secondary | ICD-10-CM | POA: Diagnosis not present

## 2020-04-04 DIAGNOSIS — I639 Cerebral infarction, unspecified: Secondary | ICD-10-CM | POA: Diagnosis not present

## 2020-04-06 DIAGNOSIS — R262 Difficulty in walking, not elsewhere classified: Secondary | ICD-10-CM | POA: Diagnosis not present

## 2020-04-06 DIAGNOSIS — J9601 Acute respiratory failure with hypoxia: Secondary | ICD-10-CM | POA: Diagnosis not present

## 2020-04-06 DIAGNOSIS — M6281 Muscle weakness (generalized): Secondary | ICD-10-CM | POA: Diagnosis not present

## 2020-04-06 DIAGNOSIS — R2681 Unsteadiness on feet: Secondary | ICD-10-CM | POA: Diagnosis not present

## 2020-04-06 DIAGNOSIS — R41841 Cognitive communication deficit: Secondary | ICD-10-CM | POA: Diagnosis not present

## 2020-04-06 DIAGNOSIS — I639 Cerebral infarction, unspecified: Secondary | ICD-10-CM | POA: Diagnosis not present

## 2020-04-07 DIAGNOSIS — R262 Difficulty in walking, not elsewhere classified: Secondary | ICD-10-CM | POA: Diagnosis not present

## 2020-04-07 DIAGNOSIS — J9601 Acute respiratory failure with hypoxia: Secondary | ICD-10-CM | POA: Diagnosis not present

## 2020-04-07 DIAGNOSIS — R41841 Cognitive communication deficit: Secondary | ICD-10-CM | POA: Diagnosis not present

## 2020-04-07 DIAGNOSIS — M6281 Muscle weakness (generalized): Secondary | ICD-10-CM | POA: Diagnosis not present

## 2020-04-07 DIAGNOSIS — I639 Cerebral infarction, unspecified: Secondary | ICD-10-CM | POA: Diagnosis not present

## 2020-04-07 DIAGNOSIS — R2681 Unsteadiness on feet: Secondary | ICD-10-CM | POA: Diagnosis not present

## 2020-04-08 DIAGNOSIS — I639 Cerebral infarction, unspecified: Secondary | ICD-10-CM | POA: Diagnosis not present

## 2020-04-08 DIAGNOSIS — J9601 Acute respiratory failure with hypoxia: Secondary | ICD-10-CM | POA: Diagnosis not present

## 2020-04-08 DIAGNOSIS — R41841 Cognitive communication deficit: Secondary | ICD-10-CM | POA: Diagnosis not present

## 2020-04-08 DIAGNOSIS — R2681 Unsteadiness on feet: Secondary | ICD-10-CM | POA: Diagnosis not present

## 2020-04-08 DIAGNOSIS — R262 Difficulty in walking, not elsewhere classified: Secondary | ICD-10-CM | POA: Diagnosis not present

## 2020-04-08 DIAGNOSIS — M6281 Muscle weakness (generalized): Secondary | ICD-10-CM | POA: Diagnosis not present

## 2020-04-09 ENCOUNTER — Other Ambulatory Visit: Payer: Self-pay

## 2020-04-09 ENCOUNTER — Inpatient Hospital Stay (HOSPITAL_COMMUNITY)
Admission: EM | Admit: 2020-04-09 | Discharge: 2020-04-14 | DRG: 374 | Disposition: A | Discharge status: Skilled Nursing Facility | Payer: Medicare HMO | Source: Skilled Nursing Facility | Attending: Internal Medicine | Admitting: Internal Medicine

## 2020-04-09 ENCOUNTER — Encounter (HOSPITAL_COMMUNITY): Payer: Self-pay | Admitting: *Deleted

## 2020-04-09 ENCOUNTER — Emergency Department (HOSPITAL_COMMUNITY): Payer: Medicare HMO

## 2020-04-09 DIAGNOSIS — Z20822 Contact with and (suspected) exposure to covid-19: Secondary | ICD-10-CM | POA: Diagnosis present

## 2020-04-09 DIAGNOSIS — C182 Malignant neoplasm of ascending colon: Principal | ICD-10-CM | POA: Diagnosis present

## 2020-04-09 DIAGNOSIS — F339 Major depressive disorder, recurrent, unspecified: Secondary | ICD-10-CM | POA: Diagnosis present

## 2020-04-09 DIAGNOSIS — Z7902 Long term (current) use of antithrombotics/antiplatelets: Secondary | ICD-10-CM | POA: Diagnosis not present

## 2020-04-09 DIAGNOSIS — J189 Pneumonia, unspecified organism: Secondary | ICD-10-CM

## 2020-04-09 DIAGNOSIS — Z66 Do not resuscitate: Secondary | ICD-10-CM | POA: Diagnosis present

## 2020-04-09 DIAGNOSIS — E785 Hyperlipidemia, unspecified: Secondary | ICD-10-CM | POA: Diagnosis present

## 2020-04-09 DIAGNOSIS — Z833 Family history of diabetes mellitus: Secondary | ICD-10-CM

## 2020-04-09 DIAGNOSIS — D490 Neoplasm of unspecified behavior of digestive system: Secondary | ICD-10-CM | POA: Diagnosis not present

## 2020-04-09 DIAGNOSIS — K5731 Diverticulosis of large intestine without perforation or abscess with bleeding: Secondary | ICD-10-CM | POA: Diagnosis present

## 2020-04-09 DIAGNOSIS — K6289 Other specified diseases of anus and rectum: Secondary | ICD-10-CM | POA: Diagnosis not present

## 2020-04-09 DIAGNOSIS — F0280 Dementia in other diseases classified elsewhere without behavioral disturbance: Secondary | ICD-10-CM | POA: Diagnosis present

## 2020-04-09 DIAGNOSIS — Z7982 Long term (current) use of aspirin: Secondary | ICD-10-CM | POA: Diagnosis not present

## 2020-04-09 DIAGNOSIS — F17210 Nicotine dependence, cigarettes, uncomplicated: Secondary | ICD-10-CM | POA: Diagnosis present

## 2020-04-09 DIAGNOSIS — K8020 Calculus of gallbladder without cholecystitis without obstruction: Secondary | ICD-10-CM | POA: Diagnosis not present

## 2020-04-09 DIAGNOSIS — D649 Anemia, unspecified: Secondary | ICD-10-CM | POA: Diagnosis present

## 2020-04-09 DIAGNOSIS — Z9119 Patient's noncompliance with other medical treatment and regimen: Secondary | ICD-10-CM | POA: Diagnosis not present

## 2020-04-09 DIAGNOSIS — I4510 Unspecified right bundle-branch block: Secondary | ICD-10-CM | POA: Diagnosis present

## 2020-04-09 DIAGNOSIS — K6389 Other specified diseases of intestine: Secondary | ICD-10-CM

## 2020-04-09 DIAGNOSIS — N323 Diverticulum of bladder: Secondary | ICD-10-CM | POA: Diagnosis not present

## 2020-04-09 DIAGNOSIS — Z79899 Other long term (current) drug therapy: Secondary | ICD-10-CM

## 2020-04-09 DIAGNOSIS — N400 Enlarged prostate without lower urinary tract symptoms: Secondary | ICD-10-CM | POA: Diagnosis not present

## 2020-04-09 DIAGNOSIS — I472 Ventricular tachycardia: Secondary | ICD-10-CM | POA: Diagnosis not present

## 2020-04-09 DIAGNOSIS — I700 Atherosclerosis of aorta: Secondary | ICD-10-CM | POA: Diagnosis not present

## 2020-04-09 DIAGNOSIS — D62 Acute posthemorrhagic anemia: Secondary | ICD-10-CM | POA: Diagnosis not present

## 2020-04-09 DIAGNOSIS — F0390 Unspecified dementia without behavioral disturbance: Secondary | ICD-10-CM | POA: Diagnosis not present

## 2020-04-09 DIAGNOSIS — I10 Essential (primary) hypertension: Secondary | ICD-10-CM | POA: Diagnosis present

## 2020-04-09 DIAGNOSIS — N3289 Other specified disorders of bladder: Secondary | ICD-10-CM | POA: Diagnosis not present

## 2020-04-09 DIAGNOSIS — K922 Gastrointestinal hemorrhage, unspecified: Secondary | ICD-10-CM | POA: Diagnosis not present

## 2020-04-09 DIAGNOSIS — I69391 Dysphagia following cerebral infarction: Secondary | ICD-10-CM | POA: Diagnosis not present

## 2020-04-09 DIAGNOSIS — K625 Hemorrhage of anus and rectum: Secondary | ICD-10-CM | POA: Diagnosis present

## 2020-04-09 DIAGNOSIS — K921 Melena: Secondary | ICD-10-CM | POA: Diagnosis not present

## 2020-04-09 HISTORY — DX: Dysphagia following cerebral infarction: I69.391

## 2020-04-09 HISTORY — DX: Dysarthria following cerebral infarction: I69.322

## 2020-04-09 HISTORY — DX: Anemia, unspecified: D64.9

## 2020-04-09 HISTORY — DX: Hyperlipidemia, unspecified: E78.5

## 2020-04-09 HISTORY — DX: Cerebral infarction, unspecified: I63.9

## 2020-04-09 HISTORY — DX: Unspecified dementia, unspecified severity, without behavioral disturbance, psychotic disturbance, mood disturbance, and anxiety: F03.90

## 2020-04-09 HISTORY — DX: Pyothorax without fistula: J86.9

## 2020-04-09 HISTORY — DX: Major depressive disorder, single episode, unspecified: F32.9

## 2020-04-09 LAB — COMPREHENSIVE METABOLIC PANEL
ALT: 11 U/L (ref 0–44)
AST: 16 U/L (ref 15–41)
Albumin: 3.2 g/dL — ABNORMAL LOW (ref 3.5–5.0)
Alkaline Phosphatase: 63 U/L (ref 38–126)
Anion gap: 8 (ref 5–15)
BUN: 32 mg/dL — ABNORMAL HIGH (ref 8–23)
CO2: 25 mmol/L (ref 22–32)
Calcium: 8.8 mg/dL — ABNORMAL LOW (ref 8.9–10.3)
Chloride: 108 mmol/L (ref 98–111)
Creatinine, Ser: 1.03 mg/dL (ref 0.61–1.24)
GFR, Estimated: >60 mL/min (ref >60)
Glucose, Bld: 99 mg/dL (ref 70–99)
Potassium: 4.8 mmol/L (ref 3.5–5.1)
Sodium: 141 mmol/L (ref 135–145)
Total Bilirubin: 0.7 mg/dL (ref 0.3–1.2)
Total Protein: 6.5 g/dL (ref 6.5–8.1)

## 2020-04-09 LAB — URINALYSIS, ROUTINE W REFLEX MICROSCOPIC
Bilirubin Urine: NEGATIVE
Glucose, UA: NEGATIVE mg/dL
Hgb urine dipstick: NEGATIVE
Ketones, ur: NEGATIVE mg/dL
Nitrite: NEGATIVE
Protein, ur: NEGATIVE mg/dL
Specific Gravity, Urine: 1.044 — ABNORMAL HIGH (ref 1.005–1.030)
pH: 5 (ref 5.0–8.0)

## 2020-04-09 LAB — RESP PANEL BY RT-PCR (FLU A&B, COVID) ARPGX2
Influenza A by PCR: NEGATIVE
Influenza B by PCR: NEGATIVE
SARS Coronavirus 2 by RT PCR: NEGATIVE

## 2020-04-09 LAB — CBC WITH DIFFERENTIAL/PLATELET
Abs Immature Granulocytes: 0.04 10*3/uL (ref 0.00–0.07)
Basophils Absolute: 0 10*3/uL (ref 0.0–0.1)
Basophils Relative: 0 %
Eosinophils Absolute: 0 10*3/uL (ref 0.0–0.5)
Eosinophils Relative: 0 %
HCT: 40 % (ref 39.0–52.0)
Hemoglobin: 11.8 g/dL — ABNORMAL LOW (ref 13.0–17.0)
Immature Granulocytes: 0 %
Lymphocytes Relative: 14 %
Lymphs Abs: 1.5 10*3/uL (ref 0.7–4.0)
MCH: 25.7 pg — ABNORMAL LOW (ref 26.0–34.0)
MCHC: 29.5 g/dL — ABNORMAL LOW (ref 30.0–36.0)
MCV: 87 fL (ref 80.0–100.0)
Monocytes Absolute: 1 10*3/uL (ref 0.1–1.0)
Monocytes Relative: 9 %
Neutro Abs: 8.3 10*3/uL — ABNORMAL HIGH (ref 1.7–7.7)
Neutrophils Relative %: 77 %
Platelets: 194 10*3/uL (ref 150–400)
RBC: 4.6 MIL/uL (ref 4.22–5.81)
RDW: 15.7 % — ABNORMAL HIGH (ref 11.5–15.5)
WBC: 10.9 10*3/uL — ABNORMAL HIGH (ref 4.0–10.5)
nRBC: 0 % (ref 0.0–0.2)

## 2020-04-09 LAB — HEMOGLOBIN AND HEMATOCRIT, BLOOD
HCT: 36 % — ABNORMAL LOW (ref 39.0–52.0)
Hemoglobin: 10.9 g/dL — ABNORMAL LOW (ref 13.0–17.0)

## 2020-04-09 LAB — TYPE AND SCREEN
ABO/RH(D): A POS
Antibody Screen: NEGATIVE

## 2020-04-09 LAB — POC OCCULT BLOOD, ED: Fecal Occult Bld: POSITIVE — AB

## 2020-04-09 LAB — LIPASE, BLOOD: Lipase: 23 U/L (ref 11–51)

## 2020-04-09 MED ORDER — SODIUM CHLORIDE 0.9 % IV SOLN
3.0000 g | Freq: Once | INTRAVENOUS | Status: AC
  Administered 2020-04-09: 3 g via INTRAVENOUS
  Filled 2020-04-09: qty 8

## 2020-04-09 MED ORDER — PANTOPRAZOLE SODIUM 40 MG IV SOLR
40.0000 mg | Freq: Two times a day (BID) | INTRAVENOUS | Status: DC
  Administered 2020-04-10 – 2020-04-14 (×9): 40 mg via INTRAVENOUS
  Filled 2020-04-09 (×9): qty 40

## 2020-04-09 MED ORDER — ACETAMINOPHEN 650 MG RE SUPP: 650.0000 mg | Freq: Four times a day (QID) | RECTAL | Status: DC | PRN

## 2020-04-09 MED ORDER — POLYETHYLENE GLYCOL 3350 17 G PO PACK: 17.0000 g | PACK | Freq: Every day | ORAL | Status: DC | PRN

## 2020-04-09 MED ORDER — ACETAMINOPHEN 325 MG PO TABS
650.0000 mg | ORAL_TABLET | Freq: Four times a day (QID) | ORAL | Status: DC | PRN
  Filled 2020-04-09: qty 2

## 2020-04-09 MED ORDER — ONDANSETRON HCL 4 MG PO TABS: 4.0000 mg | ORAL_TABLET | Freq: Four times a day (QID) | ORAL | Status: DC | PRN

## 2020-04-09 MED ORDER — ROSUVASTATIN CALCIUM 10 MG PO TABS
10.0000 mg | ORAL_TABLET | Freq: Every day | ORAL | Status: DC
  Administered 2020-04-09 – 2020-04-14 (×4): 10 mg via ORAL
  Filled 2020-04-09 (×6): qty 1

## 2020-04-09 MED ORDER — SODIUM CHLORIDE 0.9 % IV SOLN
3.0000 g | Freq: Four times a day (QID) | INTRAVENOUS | Status: DC
  Administered 2020-04-09 – 2020-04-11 (×6): 3 g via INTRAVENOUS
  Filled 2020-04-09 (×15): qty 8

## 2020-04-09 MED ORDER — PANTOPRAZOLE SODIUM 40 MG IV SOLR
40.0000 mg | Freq: Once | INTRAVENOUS | Status: AC
  Administered 2020-04-09: 40 mg via INTRAVENOUS
  Filled 2020-04-09: qty 40

## 2020-04-09 MED ORDER — DEXTROSE IN LACTATED RINGERS 5 % IV SOLN: INTRAVENOUS | Status: AC

## 2020-04-09 MED ORDER — SODIUM CHLORIDE 0.9 % IV BOLUS
500.0000 mL | Freq: Once | INTRAVENOUS | Status: AC
  Administered 2020-04-09: 500 mL via INTRAVENOUS

## 2020-04-09 MED ORDER — IOHEXOL 300 MG/ML  SOLN
80.0000 mL | Freq: Once | INTRAMUSCULAR | Status: AC | PRN
  Administered 2020-04-09: 80 mL via INTRAVENOUS

## 2020-04-09 MED ORDER — ONDANSETRON HCL 4 MG/2ML IJ SOLN: 4.0000 mg | Freq: Four times a day (QID) | INTRAMUSCULAR | Status: DC | PRN

## 2020-04-09 MED ORDER — DULOXETINE HCL 60 MG PO CPEP
60.0000 mg | ORAL_CAPSULE | Freq: Every day | ORAL | Status: DC
  Administered 2020-04-10 – 2020-04-14 (×3): 60 mg via ORAL
  Filled 2020-04-09 (×5): qty 1

## 2020-04-09 NOTE — ED Provider Notes (Signed)
[facility] EMERGENCY DEPARTMENT Provider Note   CSN: [id number] Arrival date & time: 04/09/20  1240     History Chief Complaint  Patient presents with  . Rectal Bleeding    [name] [name] is a 80 y.o. male history of dysarthria and dysphagia following CVA, anemia, pyothorax, hyperlipidemia, kidney stones, dementia, spinal stenosis, PAD, hypertension on Plavix.  Patient brought in today from nursing facility for bright red blood per rectum x2 days per triage note.  On evaluation patient is resting comfortably in bed no acute distress vital signs are stable.  Patient reports that he has had bleeding from his bottom but does not recall how long this has been occurring.  He reports that he is feeling well he has no complaints.  Level 5 caveat dementia.  HPI     Past Medical History:  Diagnosis Date  . Anemia   . Back pain   . Cerebral infarction ([place])   . Dysarthria following cerebral infarction   . Dysphagia following cerebral infarction   . Hyperlipemia   . Major depressive disorder   . Pyothorax ([place])   . Renal disorder    kidney stones  . Spinal stenosis, lumbar region, with neurogenic claudication 07/07/2014  . Unspecified dementia without behavioral disturbance [facility])     Patient Active Problem List   Diagnosis Date Noted  . Rectal bleeding 04/09/2020  . Chronic constipation   . Hyperlipidemia LDL goal <100   . Major depression, recurrent, chronic ([place])   . Anemia 03/19/2020  . Pressure injury of skin 02/13/2020  . Dementia associated with other underlying disease without behavioral disturbance ([place])   . History of CVA in adulthood   . Physical deconditioning   . Severe sepsis without septic shock ([place])   . Empyema lung ([place]) 01/02/2020  . Lobar pneumonia ([place]) 12/26/2019  . Acute hypoxemic respiratory failure ([place]) 12/26/2019  . Dysphagia 12/26/2019  . Acute encephalopathy 12/26/2019  . CVA (cerebral vascular accident) ([place]) 11/03/2019  . AMS (altered  mental status) 11/02/2019  . Acute lower UTI 11/02/2019  . Weakness 09/20/2019  . Protein-calorie malnutrition ([place]) 09/20/2019  . Acute kidney injury ([place]) 09/19/2019  . Ambulatory dysfunction 09/19/2019  . Dehydration 09/19/2019  . Fall at home, initial encounter 09/19/2019  . Aortic atherosclerosis ([place]) 02/16/2019  . HTN (hypertension) 08/13/2018  . Urinary frequency 02/05/2018  . PAD (peripheral artery disease) (HCC)-Mild 12/26/2017  . Chronic pain disorder 11/17/2017  . Unstable gait 11/17/2017  . BPH associated with nocturia 09/26/2017  . Allergic rhinitis 06/09/2017  . Tobacco use disorder 02/14/2017  . Generalized osteoarthritis of multiple sites 02/14/2017  . Bilateral lower extremity edema 08/12/2016  . Chronic right-sided low back pain with right-sided sciatica 03/14/2016  . Chronic pain of right knee 03/14/2016  . DDD (degenerative disc disease), lumbar 07/07/2014  . DDD (degenerative disc disease), cervical 07/07/2014  . DJD (degenerative joint disease) of knee 07/07/2014  . Bilateral occipital neuralgia 07/07/2014  . Spinal stenosis, lumbar region, with neurogenic claudication 07/07/2014    Past Surgical History:  Procedure Laterality Date  . CATARACT EXTRACTION W/PHACO Left 08/22/2016   Procedure: CATARACT EXTRACTION PHACO AND INTRAOCULAR LENS PLACEMENT ([place]) Left;  Surgeon: [name], MD;  Location: [facility];  Service: Ophthalmology;  Laterality: Left;  . CATARACT EXTRACTION W/PHACO Right 10/25/2016   Procedure: CATARACT EXTRACTION PHACO AND INTRAOCULAR LENS PLACEMENT ([place]) WITH ISTENT RIGHT  ;  Surgeon: [name], MD;  Location: [facility];  Service: Ophthalmology;  Laterality:  Right;  TOPICAL RIGHT  ISTENT   trabecular bypass stent was implanted/explanted.  . INGUINAL HERNIA REPAIR    . IR PERC PLEURAL DRAIN W/INDWELL CATH W/IMG GUIDE  01/03/2020  . KNEE SURGERY Right   . NECK SURGERY         Family History  Problem  Relation Age of Onset  . Diabetes Mother   . Diabetes Brother   . Diabetes Sister     Social History   Tobacco Use  . Smoking status: Current Every Day Smoker    Packs/day: 0.75    Types: Cigarettes  . Smokeless tobacco: Never Used  Vaping Use  . Vaping Use: Never used  Substance Use Topics  . Alcohol use: No  . Drug use: Never    Home Medications Prior to Admission medications   Medication Sig Start Date End Date Taking? Authorizing Provider  acetaminophen (TYLENOL) 325 MG tablet Take 2 tablets (650 mg total) by mouth every 6 (six) hours as needed for mild pain, fever or headache. 02/13/20  Yes Samella Parr, NP  ARTIFICIAL TEARS 0.2-0.2-1 % SOLN Place 2 drops into both eyes in the morning and at bedtime.  11/15/19  Yes [provider]  aspirin EC 81 MG tablet Take 81 mg by mouth daily. Swallow whole.   Yes [provider]  Janne Lab Oil [facility]) OINT Apply 1 application topically as directed. Apply to sacrum, coccyx and bilateral buttocks qshift   Yes [provider]  clopidogrel (PLAVIX) 75 MG tablet Take 1 tablet (75 mg total) by mouth daily. 11/08/19  Yes [name], MD  DULoxetine (CYMBALTA) 60 MG capsule Take 1 capsule (60 mg total) by mouth daily. 10/15/19  Yes [name], [name], MD  polyethylene glycol (MIRALAX / GLYCOLAX) 17 g packet Take 34 g by mouth 2 (two) times daily. 09/22/19  Yes [name], MD  rosuvastatin (CRESTOR) 10 MG tablet Take 1 tablet (10 mg total) by mouth daily. 10/16/19  Yes [name], [name], MD  feeding supplement, ENSURE ENLIVE, (ENSURE ENLIVE) LIQD Take 237 mLs by mouth 3 (three) times daily between meals. Patient not taking: No sig reported 11/07/19   [name], MD  Multiple Vitamin (MULTIVITAMIN WITH MINERALS) TABS tablet Take 1 tablet by mouth daily. Patient not taking: No sig reported 03/03/20   Samella Parr, NP  NON FORMULARY Diet NAS Patient not taking: No sig reported 03/20/20   [provider]     Allergies    Patient has no known allergies.  Review of Systems   Review of Systems  Unable to perform ROS: Dementia    Physical Exam Updated Vital Signs BP 128/79   Pulse 74   Temp 97.9 F (36.6 C) (Oral)   Resp 15   Ht 5\' 5"  (1.651 m)   Wt 64 kg   SpO2 100%   BMI 23.48 kg/m   Physical Exam Constitutional:      General: He is not in acute distress.    Appearance: Normal appearance. He is well-developed. He is not ill-appearing or diaphoretic.  HENT:     Head: Normocephalic and atraumatic.  Eyes:     General: Vision grossly intact. Gaze aligned appropriately.     Pupils: Pupils are equal, round, and reactive to light.  Neck:     Trachea: Trachea and phonation normal.  Cardiovascular:     Rate and Rhythm: Normal rate and regular rhythm.  Pulmonary:     Effort: Pulmonary effort is normal. No respiratory distress.  Abdominal:     General: There is no distension.     Palpations: Abdomen is soft.     Tenderness: There is no abdominal tenderness. There is no guarding or rebound.  Genitourinary:    Comments: Rectal examination chaperoned by [facility] nurse tech.  No visible or palpable hemorrhoids or other abnormalities.  Small amount bright red blood present. Musculoskeletal:        General: Normal range of motion.     Cervical back: Normal range of motion.  Skin:    General: Skin is warm and dry.  Neurological:     Mental Status: He is alert.     GCS: GCS eye subscore is 4. GCS verbal subscore is 5. GCS motor subscore is 6.     Comments: Speech is clear and goal oriented, follows commands Major Cranial nerves without deficit, no facial droop Moves extremities without ataxia, coordination intact  Psychiatric:        Behavior: Behavior normal.     ED Results / Procedures / Treatments   Labs (all labs ordered are listed, but only abnormal results are displayed) Labs Reviewed  CBC WITH DIFFERENTIAL/PLATELET - Abnormal; Notable for the following components:       Result Value   WBC 10.9 (*)    Hemoglobin 11.8 (*)    MCH 25.7 (*)    MCHC 29.5 (*)    RDW 15.7 (*)    Neutro Abs 8.3 (*)    All other components within normal limits  COMPREHENSIVE METABOLIC PANEL - Abnormal; Notable for the following components:   BUN 32 (*)    Calcium 8.8 (*)    Albumin 3.2 (*)    All other components within normal limits  POC OCCULT BLOOD, ED - Abnormal; Notable for the following components:   Fecal Occult Bld POSITIVE (*)    All other components within normal limits  RESP PANEL BY RT-PCR (FLU A&B, COVID) ARPGX2  LIPASE, BLOOD  URINALYSIS, ROUTINE W REFLEX MICROSCOPIC  TYPE AND SCREEN    EKG None  Radiology CT ABDOMEN PELVIS W CONTRAST  Result Date: 04/09/2020 CLINICAL DATA:  Bright red blood per rectum today. Diverticulitis suspected. EXAM: CT ABDOMEN AND PELVIS WITH CONTRAST TECHNIQUE: Multidetector CT imaging of the abdomen and pelvis was performed using the standard protocol following bolus administration of intravenous contrast. CONTRAST:  71mL OMNIPAQUE IOHEXOL 300 MG/ML  SOLN COMPARISON:  CT 11/22/2017 FINDINGS: Lower chest: Consolidative opacity in the dependent right lower lobe. Minimal patchy opacity in the dependent left lower lobe. Small right pleural effusion. Heart is normal in size. Hepatobiliary: Stable 13 mm enhancing lesion in the left lobe of the liver, previously characterized as focal nodular hyperplasia. No new liver lesion. Multiple layering gallstones. Difficult to evaluate for wall thickening due to motion through the gallbladder. There is no biliary dilatation. Pancreas: No ductal dilatation or inflammation. Spleen: Normal in size. Tiny low-density lesions in the spleen are similar to prior, typically benign. Adrenals/Urinary Tract: Left greater than right adrenal thickening without dominant adrenal nodule. There is multifocal right renal scarring and thinning of the renal parenchyma. Minimal scarring in the mid left kidney. Bilateral renal  cortical cysts. No evidence of solid lesion. There is symmetric excretion on delayed phase imaging. Urinary bladder is partially distended. Wall thickening is noted at the bladder base. Small basilar bladder diverticulum. Stomach/Bowel: Bowel evaluation is limited in the absence of enteric contrast, paucity of intra-abdominal fat, and patient motion. Decompressed stomach. Equivocal pre pyloric gastric wall thickening. There is  no small bowel obstruction, evidence of inflammation or wall thickening. Portions of normal appendix are visualized. No appendicitis. Moderate volume of stool throughout the colon. There is mild wall thickening of the rectum. No other colonic wall thickening. No significant diverticular disease. Examination performed in the late arterial phase, and no accumulation of contrast within the GI tract to localize site of GI bleed. Vascular/Lymphatic: Moderate aortic atherosclerosis. No aneurysm. Patent portal vein. No abdominopelvic adenopathy. Reproductive: Enlarged prostate gland spanning 5.5 cm transverse. Other: Minimal presacral edema. No ascites or free air. Minimal fat in the right greater than left inguinal canal. Musculoskeletal: Large anterior spurs in the thoracic and lumbar spine. Enlarged left transverse process of L5 and pseudoarticulation with the sacrum. Bilateral femoral head avascular necrosis without subchondral collapse. Remote right rib fractures. No acute osseous abnormalities are seen. IMPRESSION: 1. Mild wall thickening of the rectum, may represent proctitis. No other explanation for GI bleed. No significant diverticular disease. 2. Consolidative opacity in the dependent right lower lobe with small right pleural effusion. Findings may represent pneumonia or aspiration. Minimal patchy left basilar opacity. 3. Equivocal pre pyloric gastric wall thickening, can be seen with gastritis. 4. Cholelithiasis. Limited assessment for gallbladder inflammation given motion. 5. Bilateral  renal scarring, right greater than left. 6. Enlarged prostate gland. Wall thickening at the bladder base may be related to chronic bladder outlet obstruction. Small bladder diverticula. 7. Bilateral femoral head avascular necrosis without subchondral collapse. Aortic Atherosclerosis (ICD10-I70.0). Electronically Signed   By: Keith Rake M.D.   On: 04/09/2020 16:41    Procedures Procedures   Medications Ordered in ED Medications  sodium chloride 0.9 % bolus 500 mL (has no administration in time range)  pantoprazole (PROTONIX) injection 40 mg (has no administration in time range)  Ampicillin-Sulbactam (UNASYN) 3 g in sodium chloride 0.9 % 100 mL IVPB (has no administration in time range)  iohexol (OMNIPAQUE) 300 MG/ML solution 80 mL (80 mLs Intravenous Contrast Given 04/09/20 1551)    ED Course  I have reviewed the triage vital signs and the nursing notes.  Pertinent labs & imaging results that were available during my care of the patient were reviewed by me and considered in my medical decision making (see chart for details).  Clinical Course as of 04/09/20 1727  Thu Apr 09, 2020  1406 Inez Catalina RN [BM]    Clinical Course User Index [BM] Gari Crown   MDM Rules/Calculators/A&P                         Additional history obtained from: 1. Nursing notes from this visit. [facility] staff.  Inez Catalina RN patient's nurse reports of bright red blood onset this morning large amount in the commode.  Also noted to be tachycardic rate 115 at the facility.  Patient without any complaints over the last few days has been eating and drinking normally.  He just arrived at their facility 1 week ago.  Patient has been taking his Plavix. 3. Review of electronic medical records ------------------------------- I ordered, reviewed and interpreted labs which include: Type and screen A+, antibody negative. Lipase within normal limits, doubt pancreatitis. CMP shows elevated BUN of 32,  no emergent lecture light derangement, LFT elevations or gap.  Creatinine within normal limits. CBC shows mild leukocytosis of 10.9 and mild anemia of 11.8, hemoglobin less than one-point decreased from 2 months ago. Hemoccult positive.  CT AP:  IMPRESSION:  1. Mild wall thickening of the  rectum, may represent proctitis. No  other explanation for GI bleed. No significant diverticular disease.  2. Consolidative opacity in the dependent right lower lobe with  small right pleural effusion. Findings may represent pneumonia or  aspiration. Minimal patchy left basilar opacity.  3. Equivocal pre pyloric gastric wall thickening, can be seen with  gastritis.  4. Cholelithiasis. Limited assessment for gallbladder inflammation  given motion.  5. Bilateral renal scarring, right greater than left.  6. Enlarged prostate gland. Wall thickening at the bladder base may  be related to chronic bladder outlet obstruction. Small bladder  diverticula.  7. Bilateral femoral head avascular necrosis without subchondral  collapse.   4:55 PM: Consult with gastroenterologist Dr. Gala Romney.  Advises holding patient's Plavix and aspirin.  Placing patient on PPI, a clear liquid diet and trending labs.  GI team to see patient tomorrow morning. ------------------ Patient started on Protonix for possible gastritis.  Unasyn for treatment of possible aspiration pneumonia.  Covid test pending.  Patient hemodynamically stable no oxygen requirement no tachycardia or hypotension.  On reassessment patient is resting comfortably in bed no acute distress.  I attempted to reach patient's daughter [name] however there was no answer.  Consult with Dr. Denton Brick, patient accepted to hospitalist service.  Case was discussed with attending physician Dr. Roderic Palau during this visit who agrees with plan of care.  Note: Portions of this report may have been transcribed using voice recognition software. Every effort was made to ensure accuracy;  however, inadvertent computerized transcription errors may still be present. Final Clinical Impression(s) / ED Diagnoses Final diagnoses:  Rectal bleeding  Community acquired pneumonia, unspecified laterality    Rx / DC Orders ED Discharge Orders    None       Gari Crown 04/09/20 1727    Milton Ferguson, MD 04/12/20 1130

## 2020-04-09 NOTE — ED Notes (Signed)
Patient transported to CT 

## 2020-04-09 NOTE — Progress Notes (Signed)
RN informed by Central Monitoring that patient had widened QRS, tele reviewed. Informed MD. Vitals rechecked. MD made aware. Patient + dementia unable to assess symptoms. EKG ordere per MD, done, MD reviewed. Recommended to monitor patient overnight and add magnesium on AM labs.

## 2020-04-09 NOTE — H&P (Addendum)
History and Physical    Burnie Therien OMV:[id number] DOB: October 07, 1940 DOA: 04/09/2020  PCP: [name], [name], MD   Patient coming from: [facility]  I have personally briefly reviewed patient's old medical records in [place]  Chief Complaint: Rectal Bleed  HPI: Yousef Huge is a 80 y.o. male with medical history significant for hypertension, CVA, depression and dementia, empyema. At the time of my evaluation patient is awake and alert, but history is limited due to dementia, he is able to answer a few questions but cannot give detailed history.  History is obtained from chart review.  Most of the history is obtained from chart review.  Patient was brought to the ED from [facility] reports of 2 days of bright red blood per rectum..  Nursing home staff, patient had a large amounts of blood in stool today.  Patient denies vomiting, he denies difficulty breathing.  He tells me he has been coughing for the past 3 days.  He denies difficulty swallowing.  Recent prolonged hospitalization 01/02/2020 to 03/19/2018-managed for acute hypoxic respiratory failure secondary to right-sided parapneumonic effusion/empyema.  Patient required chest tube placed by IR 12/3 - 12/16.  CT surgery was consulted also. Prolonged stay was due to issues with disposition, patient requiring SNF, and needing long-term Medicaid in place before discharge.  ED Course: Systolic [id number].  Heart rate 70s.  Hemoglobin 11.8.  CT abdomen and pelvis with contrast-mild wall thickening may represent proctitis, no significant diverticular disease.  Consolidation dependent right lower lobe with small right pleural effusion may represent pneumonia or aspiration. Patient started on Unasyn ED provider consulted gastroenterologist, Dr. Aleda Grana, recommended clear liquid diet, start Protonix, hold aspirin, Plavix.  Review of Systems: As per HPI all other systems reviewed and negative.  Past Medical History:  Diagnosis  Date  . Anemia   . Back pain   . Cerebral infarction ([place])   . Dysarthria following cerebral infarction   . Dysphagia following cerebral infarction   . Hyperlipemia   . Major depressive disorder   . Pyothorax ([place])   . Renal disorder    kidney stones  . Spinal stenosis, lumbar region, with neurogenic claudication 07/07/2014  . Unspecified dementia without behavioral disturbance [facility])     Past Surgical History:  Procedure Laterality Date  . CATARACT EXTRACTION W/PHACO Left 08/22/2016   Procedure: CATARACT EXTRACTION PHACO AND INTRAOCULAR LENS PLACEMENT ([place]) Left;  Surgeon: [name], MD;  Location: [place];  Service: Ophthalmology;  Laterality: Left;  . CATARACT EXTRACTION W/PHACO Right 10/25/2016   Procedure: CATARACT EXTRACTION PHACO AND INTRAOCULAR LENS PLACEMENT ([place]) WITH ISTENT RIGHT  ;  Surgeon: [name], MD;  Location: [place];  Service: Ophthalmology;  Laterality: Right;  TOPICAL RIGHT  ISTENT   trabecular bypass stent was implanted/explanted.  . INGUINAL HERNIA REPAIR    . IR PERC PLEURAL DRAIN W/INDWELL CATH W/IMG GUIDE  01/03/2020  . KNEE SURGERY Right   . NECK SURGERY       reports that he has been smoking cigarettes. He has been smoking about 0.75 packs per day. He has never used smokeless tobacco. He reports that he does not drink alcohol and does not use drugs.  No Known Allergies  Family History  Problem Relation Age of Onset  . Diabetes Mother   . Diabetes Brother   . Diabetes Sister     Prior to Admission medications   Medication Sig Start Date End Date Taking? Authorizing Provider  acetaminophen (TYLENOL) 325 MG tablet Take 2 tablets (650 mg total) by mouth every 6 (six) hours as needed for mild pain, fever or headache. 02/13/20  Yes Samella Parr, NP  ARTIFICIAL TEARS 0.2-0.2-1 % SOLN Place 2 drops into both eyes in the morning and at bedtime.  11/15/19  Yes [provider]  aspirin EC 81 MG tablet  Take 81 mg by mouth daily. Swallow whole.   Yes [provider]  Janne Lab Oil [facility]) OINT Apply 1 application topically as directed. Apply to sacrum, coccyx and bilateral buttocks qshift   Yes [provider]  clopidogrel (PLAVIX) 75 MG tablet Take 1 tablet (75 mg total) by mouth daily. 11/08/19  Yes [name], MD  DULoxetine (CYMBALTA) 60 MG capsule Take 1 capsule (60 mg total) by mouth daily. 10/15/19  Yes [name], [name], MD  polyethylene glycol (MIRALAX / GLYCOLAX) 17 g packet Take 34 g by mouth 2 (two) times daily. 09/22/19  Yes [name], MD  rosuvastatin (CRESTOR) 10 MG tablet Take 1 tablet (10 mg total) by mouth daily. 10/16/19  Yes [name], [name], MD  feeding supplement, ENSURE ENLIVE, (ENSURE ENLIVE) LIQD Take 237 mLs by mouth 3 (three) times daily between meals. Patient not taking: No sig reported 11/07/19   [name], MD  Multiple Vitamin (MULTIVITAMIN WITH MINERALS) TABS tablet Take 1 tablet by mouth daily. Patient not taking: No sig reported 03/03/20   Samella Parr, NP  NON FORMULARY Diet NAS Patient not taking: No sig reported 03/20/20   [provider]    Physical Exam: Vitals:   04/09/20 1300 04/09/20 1330 04/09/20 1400 04/09/20 1430  BP: 104/78 134/81 130/82 128/79  Pulse: 78 75 77 74  Resp: 11 16 15 15   Temp:      TempSrc:      SpO2: 100% 100% 100% 100%  Weight:      Height:        Constitutional: NAD, calm, comfortable Vitals:   04/09/20 1300 04/09/20 1330 04/09/20 1400 04/09/20 1430  BP: 104/78 134/81 130/82 128/79  Pulse: 78 75 77 74  Resp: 11 16 15 15   Temp:      TempSrc:      SpO2: 100% 100% 100% 100%  Weight:      Height:       Eyes: PERRL, lids and conjunctivae normal ENMT: Mucous membranes are moist.  Neck: normal, supple, no masses, no thyromegaly Respiratory: clear to auscultation bilaterally, no wheezing, no crackles. Normal respiratory effort. No accessory muscle use.  Cardiovascular: Regular rate  and rhythm, no murmurs / rubs / gallops. No extremity edema. 2+ pedal pulses. No carotid bruits.  Abdomen: no tenderness, no masses palpated. No hepatosplenomegaly. Bowel sounds positive.  Musculoskeletal: no clubbing / cyanosis. No joint deformity upper and lower extremities. Good ROM, no contractures. Normal muscle tone.  Skin: no rashes, lesions, ulcers. No induration Neurologic: No apparent cranial abnormality, moving extremities spontaneously. Psychiatric: Normal judgment and insight. Alert and oriented x 3. Normal mood.   Labs on Admission: I have personally reviewed following labs and imaging studies  CBC: Recent Labs  Lab 04/09/20 1353  WBC 10.9*  NEUTROABS 8.3*  HGB 11.8*  HCT 40.0  MCV 87.0  PLT 161   Basic Metabolic Panel: Recent Labs  Lab 04/09/20 1353  NA 141  K 4.8  CL 108  CO2 25  GLUCOSE 99  BUN 32*  CREATININE 1.03  CALCIUM 8.8*   GFR: Estimated Creatinine Clearance: 51.4 mL/min (  by C-G formula based on SCr of 1.03 mg/dL). Liver Function Tests: Recent Labs  Lab 04/09/20 1353  AST 16  ALT 11  ALKPHOS 63  BILITOT 0.7  PROT 6.5  ALBUMIN 3.2*   Recent Labs  Lab 04/09/20 1353  LIPASE 23     Radiological Exams on Admission: CT ABDOMEN PELVIS W CONTRAST  Result Date: 04/09/2020 CLINICAL DATA:  Bright red blood per rectum today. Diverticulitis suspected. EXAM: CT ABDOMEN AND PELVIS WITH CONTRAST TECHNIQUE: Multidetector CT imaging of the abdomen and pelvis was performed using the standard protocol following bolus administration of intravenous contrast. CONTRAST:  63mL OMNIPAQUE IOHEXOL 300 MG/ML  SOLN COMPARISON:  CT 11/22/2017 FINDINGS: Lower chest: Consolidative opacity in the dependent right lower lobe. Minimal patchy opacity in the dependent left lower lobe. Small right pleural effusion. Heart is normal in size. Hepatobiliary: Stable 13 mm enhancing lesion in the left lobe of the liver, previously characterized as focal nodular hyperplasia. No new  liver lesion. Multiple layering gallstones. Difficult to evaluate for wall thickening due to motion through the gallbladder. There is no biliary dilatation. Pancreas: No ductal dilatation or inflammation. Spleen: Normal in size. Tiny low-density lesions in the spleen are similar to prior, typically benign. Adrenals/Urinary Tract: Left greater than right adrenal thickening without dominant adrenal nodule. There is multifocal right renal scarring and thinning of the renal parenchyma. Minimal scarring in the mid left kidney. Bilateral renal cortical cysts. No evidence of solid lesion. There is symmetric excretion on delayed phase imaging. Urinary bladder is partially distended. Wall thickening is noted at the bladder base. Small basilar bladder diverticulum. Stomach/Bowel: Bowel evaluation is limited in the absence of enteric contrast, paucity of intra-abdominal fat, and patient motion. Decompressed stomach. Equivocal pre pyloric gastric wall thickening. There is no small bowel obstruction, evidence of inflammation or wall thickening. Portions of normal appendix are visualized. No appendicitis. Moderate volume of stool throughout the colon. There is mild wall thickening of the rectum. No other colonic wall thickening. No significant diverticular disease. Examination performed in the late arterial phase, and no accumulation of contrast within the GI tract to localize site of GI bleed. Vascular/Lymphatic: Moderate aortic atherosclerosis. No aneurysm. Patent portal vein. No abdominopelvic adenopathy. Reproductive: Enlarged prostate gland spanning 5.5 cm transverse. Other: Minimal presacral edema. No ascites or free air. Minimal fat in the right greater than left inguinal canal. Musculoskeletal: Large anterior spurs in the thoracic and lumbar spine. Enlarged left transverse process of L5 and pseudoarticulation with the sacrum. Bilateral femoral head avascular necrosis without subchondral collapse. Remote right rib  fractures. No acute osseous abnormalities are seen. IMPRESSION: 1. Mild wall thickening of the rectum, may represent proctitis. No other explanation for GI bleed. No significant diverticular disease. 2. Consolidative opacity in the dependent right lower lobe with small right pleural effusion. Findings may represent pneumonia or aspiration. Minimal patchy left basilar opacity. 3. Equivocal pre pyloric gastric wall thickening, can be seen with gastritis. 4. Cholelithiasis. Limited assessment for gallbladder inflammation given motion. 5. Bilateral renal scarring, right greater than left. 6. Enlarged prostate gland. Wall thickening at the bladder base may be related to chronic bladder outlet obstruction. Small bladder diverticula. 7. Bilateral femoral head avascular necrosis without subchondral collapse. Aortic Atherosclerosis (ICD10-I70.0). Electronically Signed   By: Keith Rake M.D.   On: 04/09/2020 16:41    EKG: None.   Assessment/Plan Principal Problem:   Rectal bleeding Active Problems:   HTN (hypertension)   Dementia associated with other underlying disease without behavioral disturbance (  [place])   Major depression, recurrent, chronic (HCC)    Rectal bleed-hemoglobin stable 11.8.  Vitals stable.  No prior colonoscopy or EGD on file.  No abdominal pain.  On aspirin and Plavix.  Abdominal CT without significant diverticular disease, suggest proctitis.  No colonoscopy or EGD on file. - EDP consulted GI -Hold aspirin and Plavix -IV Protonix 40 twice daily -Clear liquid diet, n.p.o. midnight - CBC q6h x 3 - D5 LR 75 cc/hr x 15hrs - Addendum- reported 6 beat run of V tach, unable to tell if symptomatic 2/2 dementia.  Vitals stable.  Potassium 4.8.  Will check magnesium.  EKG shows sinus rhythm, old right bundle branch block.  But QTC is prolonged at 506.  Possible pneumonia/aspiration-cough present, no dyspnea.  O2 sats 100% on room air.  Afebrile.  WBC 10.9.  Rules out for sepsis.  CT shows  consolidative opacity left lower lobe with small right pleural effusion suggestive of pneumonia or aspiration.  History of empyema 01/2020 involving same side, requiring chest tube.  Cultures at that time no growth. -Continue IV Unasyn -Speech therapy evaluation  Hypertension -stable, not on antihypertensives.  Dementia, depression-nursing home resident. -Resume Cymbalta.  Dyslipidemia -Resume Crestor  DVT prophylaxis: SCDs Code Status: DNR, universal DNR form at bedside. Family Communication: None at bedside. Disposition Plan:  2 days Consults called: GI Admission status:  Obs, Tele  Bethena Roys MD [facility]  04/09/2020, 6:47 PM

## 2020-04-09 NOTE — ED Notes (Signed)
ED TO INPATIENT HANDOFF REPORT  ED Nurse Name and Phone #:   S Name/Age/Gender [name] [name] 80 y.o. male Room/Bed: APA07/APA07  Code Status   Code Status: Prior  Home/SNF/Other Nursing Home Patient oriented to: self Is this baseline? Yes   Triage Complete: Triage complete  Chief Complaint Rectal bleeding [K62.5]  Triage Note Pt brought in by [facility] with c/o bright red blood per rectum x 2 today. Pt denies pain. [place] reported HR 115 at their facility, but vitals otherwise normal. They also report pt is on Plavix.    Allergies No Known Allergies  Level of Care/Admitting Diagnosis ED Disposition    ED Disposition Condition Comment   Admit  Hospital Area: [facility] [[id number]]  Level of Care: Telemetry [5]  Covid Evaluation: Asymptomatic Screening Protocol (No Symptoms)  Diagnosis: Rectal bleeding [878676]  Admitting Physician: Bethena Roys [[id number]]  Attending Physician: Bethena Roys Nessa.Cuff       B Medical/Surgery History Past Medical History:  Diagnosis Date  . Anemia   . Back pain   . Cerebral infarction ([place])   . Dysarthria following cerebral infarction   . Dysphagia following cerebral infarction   . Hyperlipemia   . Major depressive disorder   . Pyothorax ([place])   . Renal disorder    kidney stones  . Spinal stenosis, lumbar region, with neurogenic claudication 07/07/2014  . Unspecified dementia without behavioral disturbance [facility])    Past Surgical History:  Procedure Laterality Date  . CATARACT EXTRACTION W/PHACO Left 08/22/2016   Procedure: CATARACT EXTRACTION PHACO AND INTRAOCULAR LENS PLACEMENT ([place]) Left;  Surgeon: [name], MD;  Location: [place];  Service: Ophthalmology;  Laterality: Left;  . CATARACT EXTRACTION W/PHACO Right 10/25/2016   Procedure: CATARACT EXTRACTION PHACO AND INTRAOCULAR LENS PLACEMENT ([place]) WITH ISTENT RIGHT  ;  Surgeon: [name], MD;  Location: [place];  Service: Ophthalmology;  Laterality: Right;  TOPICAL RIGHT  ISTENT   trabecular bypass stent was implanted/explanted.  . INGUINAL HERNIA REPAIR    . IR PERC PLEURAL DRAIN W/INDWELL CATH W/IMG GUIDE  01/03/2020  . KNEE SURGERY Right   . NECK SURGERY       A IV Location/Drains/Wounds Patient Lines/Drains/Airways Status    Active Line/Drains/Airways    Name Placement date Placement time Site Days   Peripheral IV 04/09/20 Right Forearm 04/09/20  1509  Forearm  less than 1   External Urinary Catheter 04/09/20  1746  --  less than 1   Incision (Closed) 01/03/20 01/03/20  2000  -- 97   Pressure Injury 02/12/20 Sacrum Mid Stage 2 -  Partial thickness loss of dermis presenting as a shallow open injury with a red, pink wound bed without slough. 02/12/20  1930  -- 57          Intake/Output Last 24 hours No intake or output data in the 24 hours ending 04/09/20 1812  Labs/Imaging Results for orders placed or performed during the hospital encounter of 04/09/20 (from the past 48 hour(s))  POC occult blood, ED     Status: Abnormal   Collection Time: 04/09/20  1:26 PM  Result Value Ref Range   Fecal Occult Bld POSITIVE (A) NEGATIVE  CBC with Differential     Status: Abnormal   Collection Time: 04/09/20  1:53 PM  Result Value Ref Range   WBC 10.9 (H) 4.0 - 10.5 K/uL   RBC 4.60 4.22 - 5.81 MIL/uL   Hemoglobin 11.8 (L)  13.0 - 17.0 g/dL   HCT 40.0 39.0 - 52.0 %   MCV 87.0 80.0 - 100.0 fL   MCH 25.7 (L) 26.0 - 34.0 pg   MCHC 29.5 (L) 30.0 - 36.0 g/dL   RDW 15.7 (H) 11.5 - 15.5 %   Platelets 194 150 - 400 K/uL   nRBC 0.0 0.0 - 0.2 %   Neutrophils Relative % 77 %   Neutro Abs 8.3 (H) 1.7 - 7.7 K/uL   Lymphocytes Relative 14 %   Lymphs Abs 1.5 0.7 - 4.0 K/uL   Monocytes Relative 9 %   Monocytes Absolute 1.0 0.1 - 1.0 K/uL   Eosinophils Relative 0 %   Eosinophils Absolute 0.0 0.0 - 0.5 K/uL   Basophils Relative 0 %   Basophils Absolute 0.0 0.0 - 0.1 K/uL   Immature  Granulocytes 0 %   Abs Immature Granulocytes 0.04 0.00 - 0.07 K/uL    Comment: Performed at [facility], [address] [place], [place] [address]  Comprehensive metabolic panel     Status: Abnormal   Collection Time: 04/09/20  1:53 PM  Result Value Ref Range   Sodium 141 135 - 145 mmol/L   Potassium 4.8 3.5 - 5.1 mmol/L   Chloride 108 98 - 111 mmol/L   CO2 25 22 - 32 mmol/L   Glucose, Bld 99 70 - 99 mg/dL    Comment: Glucose reference range applies only to samples taken after fasting for at least 8 hours.   BUN 32 (H) 8 - 23 mg/dL   Creatinine, Ser 1.03 0.61 - 1.24 mg/dL   Calcium 8.8 (L) 8.9 - 10.3 mg/dL   Total Protein 6.5 6.5 - 8.1 g/dL   Albumin 3.2 (L) 3.5 - 5.0 g/dL   AST 16 15 - 41 U/L   ALT 11 0 - 44 U/L   Alkaline Phosphatase 63 38 - 126 U/L   Total Bilirubin 0.7 0.3 - 1.2 mg/dL   GFR, Estimated >60 >60 mL/min    Comment: (NOTE) Calculated using the CKD-EPI Creatinine Equation (2021)    Anion gap 8 5 - 15    Comment: Performed at [facility], [address] [place], [place] [address]  Lipase, blood     Status: None   Collection Time: 04/09/20  1:53 PM  Result Value Ref Range   Lipase 23 11 - 51 U/L    Comment: Performed at [facility], [address], [place], [place] [address]  Type and screen [facility]     Status: None   Collection Time: 04/09/20  1:54 PM  Result Value Ref Range   ABO/RH(D) A POS    Antibody Screen NEG    Sample Expiration      04/12/2020,2359 Performed at [facility], [address] [place], [place] [address]   Urinalysis, Routine w reflex microscopic Urine, Clean Catch     Status: Abnormal   Collection Time: 04/09/20  5:40 PM  Result Value Ref Range   Color, Urine YELLOW YELLOW   APPearance CLEAR CLEAR   Specific Gravity, Urine 1.044 (H) 1.005 - 1.030   pH 5.0 5.0 - 8.0   Glucose, UA NEGATIVE NEGATIVE mg/dL   Hgb urine dipstick NEGATIVE NEGATIVE   Bilirubin Urine NEGATIVE NEGATIVE   Ketones, ur NEGATIVE NEGATIVE mg/dL    Protein, ur NEGATIVE NEGATIVE mg/dL   Nitrite NEGATIVE NEGATIVE   Leukocytes,Ua TRACE (A) NEGATIVE   RBC / HPF 0-5 0 - 5 RBC/hpf   WBC, UA 0-5 0 -  5 WBC/hpf   Bacteria, UA RARE (A) NONE SEEN   Squamous Epithelial / LPF 0-5 0 - 5   Mucus PRESENT     Comment: Performed at [facility], [address], [place], [place] [address]   CT ABDOMEN PELVIS W CONTRAST  Result Date: 04/09/2020 CLINICAL DATA:  Bright red blood per rectum today. Diverticulitis suspected. EXAM: CT ABDOMEN AND PELVIS WITH CONTRAST TECHNIQUE: Multidetector CT imaging of the abdomen and pelvis was performed using the standard protocol following bolus administration of intravenous contrast. CONTRAST:  3mL OMNIPAQUE IOHEXOL 300 MG/ML  SOLN COMPARISON:  CT 11/22/2017 FINDINGS: Lower chest: Consolidative opacity in the dependent right lower lobe. Minimal patchy opacity in the dependent left lower lobe. Small right pleural effusion. Heart is normal in size. Hepatobiliary: Stable 13 mm enhancing lesion in the left lobe of the liver, previously characterized as focal nodular hyperplasia. No new liver lesion. Multiple layering gallstones. Difficult to evaluate for wall thickening due to motion through the gallbladder. There is no biliary dilatation. Pancreas: No ductal dilatation or inflammation. Spleen: Normal in size. Tiny low-density lesions in the spleen are similar to prior, typically benign. Adrenals/Urinary Tract: Left greater than right adrenal thickening without dominant adrenal nodule. There is multifocal right renal scarring and thinning of the renal parenchyma. Minimal scarring in the mid left kidney. Bilateral renal cortical cysts. No evidence of solid lesion. There is symmetric excretion on delayed phase imaging. Urinary bladder is partially distended. Wall thickening is noted at the bladder base. Small basilar bladder diverticulum. Stomach/Bowel: Bowel evaluation is limited in the absence of enteric contrast, paucity of  intra-abdominal fat, and patient motion. Decompressed stomach. Equivocal pre pyloric gastric wall thickening. There is no small bowel obstruction, evidence of inflammation or wall thickening. Portions of normal appendix are visualized. No appendicitis. Moderate volume of stool throughout the colon. There is mild wall thickening of the rectum. No other colonic wall thickening. No significant diverticular disease. Examination performed in the late arterial phase, and no accumulation of contrast within the GI tract to localize site of GI bleed. Vascular/Lymphatic: Moderate aortic atherosclerosis. No aneurysm. Patent portal vein. No abdominopelvic adenopathy. Reproductive: Enlarged prostate gland spanning 5.5 cm transverse. Other: Minimal presacral edema. No ascites or free air. Minimal fat in the right greater than left inguinal canal. Musculoskeletal: Large anterior spurs in the thoracic and lumbar spine. Enlarged left transverse process of L5 and pseudoarticulation with the sacrum. Bilateral femoral head avascular necrosis without subchondral collapse. Remote right rib fractures. No acute osseous abnormalities are seen. IMPRESSION: 1. Mild wall thickening of the rectum, may represent proctitis. No other explanation for GI bleed. No significant diverticular disease. 2. Consolidative opacity in the dependent right lower lobe with small right pleural effusion. Findings may represent pneumonia or aspiration. Minimal patchy left basilar opacity. 3. Equivocal pre pyloric gastric wall thickening, can be seen with gastritis. 4. Cholelithiasis. Limited assessment for gallbladder inflammation given motion. 5. Bilateral renal scarring, right greater than left. 6. Enlarged prostate gland. Wall thickening at the bladder base may be related to chronic bladder outlet obstruction. Small bladder diverticula. 7. Bilateral femoral head avascular necrosis without subchondral collapse. Aortic Atherosclerosis (ICD10-I70.0). Electronically  Signed   By: Keith Rake M.D.   On: 04/09/2020 16:41    Pending Labs Unresulted Labs (From admission, onward)          Start     Ordered   04/09/20 1649  Resp Panel by RT-PCR (Flu A&B, Covid) Nasopharyngeal Swab  (Tier 2 - Symptomatic/asymptomatic  with Precautions )  Once,   STAT       Question Answer Comment  Is this test for diagnosis or screening Screening   Symptomatic for COVID-19 as defined by CDC No   Hospitalized for COVID-19 No   Admitted to ICU for COVID-19 No   Previously tested for COVID-19 Yes   Resident in a congregate (group) care setting Unknown   Employed in healthcare setting Unknown   Has patient completed COVID vaccination(s) (2 doses of Pfizer/Moderna 1 dose of [employer]) Unknown      04/09/20 1648          Vitals/Pain Today's Vitals   04/09/20 1330 04/09/20 1400 04/09/20 1430 04/09/20 1730  BP: 134/81 130/82 128/79 140/86  Pulse: 75 77 74 85  Resp: 16 15 15 18   Temp:      TempSrc:      SpO2: 100% 100% 100% 100%  Weight:      Height:      PainSc:        Isolation Precautions Airborne and Contact precautions  Medications Medications  Ampicillin-Sulbactam (UNASYN) 3 g in sodium chloride 0.9 % 100 mL IVPB (3 g Intravenous New Bag/Given 04/09/20 1809)  iohexol (OMNIPAQUE) 300 MG/ML solution 80 mL (80 mLs Intravenous Contrast Given 04/09/20 1551)  sodium chloride 0.9 % bolus 500 mL (500 mLs Intravenous New Bag/Given 04/09/20 1806)  pantoprazole (PROTONIX) injection 40 mg (40 mg Intravenous Given 04/09/20 1757)    Mobility manual wheelchair High fall risk   Focused Assessments    R Recommendations: See Admitting Provider Note  Report given to:   Additional Notes:

## 2020-04-09 NOTE — Progress Notes (Signed)
Pharmacy Antibiotic Note  [name] [name] is a 80 y.o. male admitted on 04/09/2020 with CT showing consolidative opacity left lower lobe with small right pleural effusion suggestive of pneumonia or aspiration.  History of empyema 01/2020 involving same side, requiring chest tube. Pt received one dose of Unasyn 3 gms IV in ED and pharmacy is being consulted for further dosing for possible aspiration pneumonia.  Plan:  Unasyn 3 gms IV q 6 hr.  Will follow renal function, clinical status, LOT.  Height: 5\' 5"  (165.1 cm) Weight: 64 kg (141 lb 1.5 oz) IBW/kg (Calculated) : 61.5  Temp (24hrs), Avg:97.9 F (36.6 C), Min:97.9 F (36.6 C), Max:97.9 F (36.6 C)  Recent Labs  Lab 04/09/20 1353  WBC 10.9*  CREATININE 1.03    Estimated Creatinine Clearance: 51.4 mL/min (by C-G formula based on SCr of 1.03 mg/dL).    No Known Allergies  Antimicrobials this admission: Ampicillin/sulbactam 3/10 >>   Thank you for allowing pharmacy to be a part of this patient's care.  Blenda Nicely, RPh Clinical Pharmacist Pharmacist phone directory can now be found on [place] under [place]. 04/09/2020 7:06 PM

## 2020-04-09 NOTE — ED Triage Notes (Signed)
Pt brought in by [facility] with c/o bright red blood per rectum x 2 today. Pt denies pain. [place] reported HR 115 at their facility, but vitals otherwise normal. They also report pt is on Plavix.

## 2020-04-10 DIAGNOSIS — F0280 Dementia in other diseases classified elsewhere without behavioral disturbance: Secondary | ICD-10-CM | POA: Diagnosis present

## 2020-04-10 DIAGNOSIS — F17210 Nicotine dependence, cigarettes, uncomplicated: Secondary | ICD-10-CM | POA: Diagnosis not present

## 2020-04-10 DIAGNOSIS — C182 Malignant neoplasm of ascending colon: Secondary | ICD-10-CM | POA: Diagnosis not present

## 2020-04-10 DIAGNOSIS — K6289 Other specified diseases of anus and rectum: Secondary | ICD-10-CM | POA: Diagnosis not present

## 2020-04-10 DIAGNOSIS — D62 Acute posthemorrhagic anemia: Secondary | ICD-10-CM | POA: Diagnosis not present

## 2020-04-10 DIAGNOSIS — I10 Essential (primary) hypertension: Secondary | ICD-10-CM | POA: Diagnosis not present

## 2020-04-10 DIAGNOSIS — F339 Major depressive disorder, recurrent, unspecified: Secondary | ICD-10-CM

## 2020-04-10 DIAGNOSIS — D490 Neoplasm of unspecified behavior of digestive system: Secondary | ICD-10-CM | POA: Diagnosis not present

## 2020-04-10 DIAGNOSIS — Z20822 Contact with and (suspected) exposure to covid-19: Secondary | ICD-10-CM | POA: Diagnosis not present

## 2020-04-10 DIAGNOSIS — I69391 Dysphagia following cerebral infarction: Secondary | ICD-10-CM | POA: Diagnosis not present

## 2020-04-10 DIAGNOSIS — I4510 Unspecified right bundle-branch block: Secondary | ICD-10-CM | POA: Diagnosis present

## 2020-04-10 DIAGNOSIS — K5731 Diverticulosis of large intestine without perforation or abscess with bleeding: Secondary | ICD-10-CM | POA: Diagnosis not present

## 2020-04-10 DIAGNOSIS — E785 Hyperlipidemia, unspecified: Secondary | ICD-10-CM | POA: Diagnosis not present

## 2020-04-10 DIAGNOSIS — Z833 Family history of diabetes mellitus: Secondary | ICD-10-CM | POA: Diagnosis not present

## 2020-04-10 DIAGNOSIS — Z7902 Long term (current) use of antithrombotics/antiplatelets: Secondary | ICD-10-CM | POA: Diagnosis not present

## 2020-04-10 DIAGNOSIS — Z79899 Other long term (current) drug therapy: Secondary | ICD-10-CM | POA: Diagnosis not present

## 2020-04-10 DIAGNOSIS — Z7982 Long term (current) use of aspirin: Secondary | ICD-10-CM | POA: Diagnosis not present

## 2020-04-10 DIAGNOSIS — K921 Melena: Secondary | ICD-10-CM | POA: Diagnosis not present

## 2020-04-10 DIAGNOSIS — I472 Ventricular tachycardia: Secondary | ICD-10-CM | POA: Diagnosis not present

## 2020-04-10 DIAGNOSIS — K625 Hemorrhage of anus and rectum: Secondary | ICD-10-CM | POA: Diagnosis not present

## 2020-04-10 DIAGNOSIS — K922 Gastrointestinal hemorrhage, unspecified: Secondary | ICD-10-CM | POA: Diagnosis not present

## 2020-04-10 DIAGNOSIS — D649 Anemia, unspecified: Secondary | ICD-10-CM | POA: Diagnosis present

## 2020-04-10 DIAGNOSIS — Z66 Do not resuscitate: Secondary | ICD-10-CM | POA: Diagnosis not present

## 2020-04-10 DIAGNOSIS — Z9119 Patient's noncompliance with other medical treatment and regimen: Secondary | ICD-10-CM | POA: Diagnosis not present

## 2020-04-10 LAB — CBC
HCT: 29.2 % — ABNORMAL LOW (ref 39.0–52.0)
HCT: 32.4 % — ABNORMAL LOW (ref 39.0–52.0)
HCT: 33.1 % — ABNORMAL LOW (ref 39.0–52.0)
Hemoglobin: 10.1 g/dL — ABNORMAL LOW (ref 13.0–17.0)
Hemoglobin: 8.6 g/dL — ABNORMAL LOW (ref 13.0–17.0)
Hemoglobin: 9.9 g/dL — ABNORMAL LOW (ref 13.0–17.0)
MCH: 26.1 pg (ref 26.0–34.0)
MCH: 26.3 pg (ref 26.0–34.0)
MCH: 26.4 pg (ref 26.0–34.0)
MCHC: 29.5 g/dL — ABNORMAL LOW (ref 30.0–36.0)
MCHC: 30.5 g/dL (ref 30.0–36.0)
MCHC: 30.6 g/dL (ref 30.0–36.0)
MCV: 85.9 fL (ref 80.0–100.0)
MCV: 86.6 fL (ref 80.0–100.0)
MCV: 88.5 fL (ref 80.0–100.0)
Platelets: 156 10*3/uL (ref 150–400)
Platelets: 164 10*3/uL (ref 150–400)
Platelets: 191 10*3/uL (ref 150–400)
RBC: 3.3 MIL/uL — ABNORMAL LOW (ref 4.22–5.81)
RBC: 3.77 MIL/uL — ABNORMAL LOW (ref 4.22–5.81)
RBC: 3.82 MIL/uL — ABNORMAL LOW (ref 4.22–5.81)
RDW: 15.3 % (ref 11.5–15.5)
RDW: 15.4 % (ref 11.5–15.5)
RDW: 15.7 % — ABNORMAL HIGH (ref 11.5–15.5)
WBC: 11 10*3/uL — ABNORMAL HIGH (ref 4.0–10.5)
WBC: 7.8 10*3/uL (ref 4.0–10.5)
WBC: 8.4 10*3/uL (ref 4.0–10.5)
nRBC: 0 % (ref 0.0–0.2)
nRBC: 0 % (ref 0.0–0.2)
nRBC: 0 % (ref 0.0–0.2)

## 2020-04-10 LAB — PROCALCITONIN: Procalcitonin: <0.1 ng/mL

## 2020-04-10 LAB — MAGNESIUM: Magnesium: 1.6 mg/dL — ABNORMAL LOW (ref 1.7–2.4)

## 2020-04-10 MED ORDER — MAGNESIUM SULFATE 2 GM/50ML IV SOLN
2.0000 g | Freq: Once | INTRAVENOUS | Status: AC
  Administered 2020-04-10: 2 g via INTRAVENOUS
  Filled 2020-04-10: qty 50

## 2020-04-10 MED ORDER — SODIUM CHLORIDE 0.9 % IV SOLN
INTRAVENOUS | Status: DC | PRN
  Administered 2020-04-10: 500 mL via INTRAVENOUS

## 2020-04-10 NOTE — TOC Initial Note (Signed)
Transition of Care [facility]) - Initial/Assessment Note    Patient Details  Name: [name] [name] MRN: [id number] Date of Birth: 1940-04-03  [facility] CM/SW Contact:    Natasha Bence, LCSW Phone Number: 04/10/2020, 2:28 PM  Clinical Narrative:                 Patient is a 80 year old admitted for Rectal Bleeding. Patient is from [facility]. CSW started FL2. CSW spoke with patient's social worker with [facility]. DSS worker reported that DSS is currently perusing custody, but the sister listed in the contacts is okay to contact for patient's needs. DSS worker reported that she will follow up with CSW on Monday at CSW's provided number. TOC to follow.   Expected Discharge Plan: Skilled Nursing Facility Barriers to Discharge: Continued Medical Work up   Patient Goals and CMS Choice Patient states their goals for this hospitalization and ongoing recovery are:: Return to [email or web address] Compare Post Acute Care list provided to:: Patient Choice offered to / list presented to : Patient  Expected Discharge Plan and Services Expected Discharge Plan: [place] In-house Referral: NA Discharge Planning Services: NA Post Acute Care Choice: NA Living arrangements for the past 2 months: [place]                 DME Arranged: N/A DME Agency: NA       HH Arranged: NA [place] Agency: NA        Prior Living Arrangements/Services Living arrangements for the past 2 months: [place] Lives with:: Self Patient language and need for interpreter reviewed:: Yes Do you feel safe going back to the place where you live?: Yes      Need for Family Participation in Patient Care: Yes (Comment) Care giver support system in place?: Yes (comment)   Criminal Activity/Legal Involvement Pertinent to Current Situation/Hospitalization: No - Comment as needed  Activities of Daily Living      Permission Sought/Granted Permission  sought to share information with : Case Manager,Guardian,Family Supports Permission granted to share information with : Yes, Verbal Permission Granted  Share Information with NAME: [name], [name]  Permission granted to share info w AGENCY: [place] granted to share info w Relationship: Sister  Permission granted to share info w Contact Information: [phone]  Emotional Assessment       Orientation: : Oriented to Self,Oriented to Situation,Oriented to Place Alcohol / Substance Use: Not Applicable Psych Involvement: No (comment)  Admission diagnosis:  Rectal bleeding [K62.5] Community acquired pneumonia, unspecified laterality [J18.9] Patient Active Problem List   Diagnosis Date Noted  . Proctitis   . Rectal bleeding 04/09/2020  . Chronic constipation   . Hyperlipidemia LDL goal <100   . Major depression, recurrent, chronic ([place])   . Anemia 03/19/2020  . Pressure injury of skin 02/13/2020  . Dementia associated with other underlying disease without behavioral disturbance ([place])   . History of CVA in adulthood   . Physical deconditioning   . Severe sepsis without septic shock ([place])   . Empyema lung ([place]) 01/02/2020  . Lobar pneumonia ([place]) 12/26/2019  . Acute hypoxemic respiratory failure ([place]) 12/26/2019  . Dysphagia 12/26/2019  . Acute encephalopathy 12/26/2019  . CVA (cerebral vascular accident) ([place]) 11/03/2019  . AMS (altered mental status) 11/02/2019  . Acute lower UTI 11/02/2019  . Weakness 09/20/2019  . Protein-calorie malnutrition ([place]) 09/20/2019  . Acute kidney injury ([place]) 09/19/2019  . Ambulatory dysfunction 09/19/2019  .  Dehydration 09/19/2019  . Fall at home, initial encounter 09/19/2019  . Aortic atherosclerosis ([place]) 02/16/2019  . HTN (hypertension) 08/13/2018  . Urinary frequency 02/05/2018  . PAD (peripheral artery disease) (HCC)-Mild 12/26/2017  . Chronic pain disorder 11/17/2017  . Unstable gait 11/17/2017  . BPH associated with  nocturia 09/26/2017  . Allergic rhinitis 06/09/2017  . Tobacco use disorder 02/14/2017  . Generalized osteoarthritis of multiple sites 02/14/2017  . Bilateral lower extremity edema 08/12/2016  . Chronic right-sided low back pain with right-sided sciatica 03/14/2016  . Chronic pain of right knee 03/14/2016  . DDD (degenerative disc disease), lumbar 07/07/2014  . DDD (degenerative disc disease), cervical 07/07/2014  . DJD (degenerative joint disease) of knee 07/07/2014  . Bilateral occipital neuralgia 07/07/2014  . Spinal stenosis, lumbar region, with neurogenic claudication 07/07/2014   PCP:  [name], [name], MD Pharmacy:   [facility], [place] - [place] AT [facility] [address] [place] [address] Phone: [phone] Fax: [phone]     Social Determinants of Health (SDOH) Interventions    Readmission Risk Interventions No flowsheet data found.

## 2020-04-10 NOTE — Consult Note (Signed)
Referring Provider: [facility] Primary Care Physician:  [name], [name], MD Primary Gastroenterologist:  Dr. Jenetta Downer (previously unassigned)  Date of Admission: 04/09/20 Date of Consultation: 04/10/20  Reason for Consultation:  GI Bleed  HPI:  [name] [name] is a 80 y.o. male with a past medical history of anemia, cerebral infarction with resultant dysarthria, hyperlipidemia, depression, significant dementia, empyema.  Significantly limited history due to dementia only able to answer few questions but no detailed history.  Patient presented to the ED from the [facility] with 2 days of bright red blood per rectum which the nursing staff noted large amount.  No vomiting.  Recent prolonged hospitalization 01/02/2020 through 03/20/2019 for acute hypoxic respiratory failure secondary to right-sided parapneumonic effusion/empyema.  He did require chest tube by IR.  In the emergency room his vital signs were stable.  CT of the abdomen and pelvis as outlined below with possible proctitis, no significant diverticulitis.  Noted consolidation dependent right lower lobe with small pleural effusion possible pneumonia versus aspiration for which she was started on Unasyn.  GI was consulted yesterday evening and recommended clear liquid diet, Protonix, hold aspirin and Plavix.  Hemoglobin yesterday on presentation was 11.8.  Since then this is declined to 10.9, then 9.9, and this morning 8.6.  Also of note, yesterday evening the patient had a 6 beat run of V. tach unknown if symptomatic secondary to dementia with stable vitals, normal potassium.  Magnesium labs were added and showed mildly low at 1.6.  EKG with sinus rhythm, old bundle branch block, QTC prolonged at 506.  No history of previous colonoscopy or endoscopy.  Patient contact of record is his sister [name] [name] [phone].  Today he states he is doing okay.  He has not looked to see if he had bleeding.  He states they told him  he has had bleeding.  Denies abdominal pain, nausea, vomiting.  When in the room speech therapy was at bedside doing a swallow evaluation.  She states he has only had about 1/4 of one graham cracker and 1-2 bites of applesauce.  States he is never had a problem with this before.  History is limited due to dementia.  He is oriented to person, thinks he is at the [facility], thinks it is 44.  No other overt GI complaints.  Past Medical History:  Diagnosis Date  . Anemia   . Back pain   . Cerebral infarction ([place])   . Dysarthria following cerebral infarction   . Dysphagia following cerebral infarction   . Hyperlipemia   . Major depressive disorder   . Pyothorax ([place])   . Renal disorder    [name] stones  . Spinal stenosis, lumbar region, with neurogenic claudication 07/07/2014  . Unspecified dementia without behavioral disturbance [facility])     Past Surgical History:  Procedure Laterality Date  . CATARACT EXTRACTION W/PHACO Left 08/22/2016   Procedure: CATARACT EXTRACTION PHACO AND INTRAOCULAR LENS PLACEMENT ([place]) Left;  Surgeon: [name], MD;  Location: [facility];  Service: Ophthalmology;  Laterality: Left;  . CATARACT EXTRACTION W/PHACO Right 10/25/2016   Procedure: CATARACT EXTRACTION PHACO AND INTRAOCULAR LENS PLACEMENT ([place]) WITH ISTENT RIGHT  ;  Surgeon: [name], MD;  Location: [place] [facility];  Service: Ophthalmology;  Laterality: Right;  TOPICAL RIGHT  ISTENT   trabecular bypass stent was implanted/explanted.  . INGUINAL HERNIA REPAIR    . IR PERC PLEURAL DRAIN W/INDWELL CATH W/IMG GUIDE  01/03/2020  . KNEE SURGERY Right   .  NECK SURGERY      Prior to Admission medications   Medication Sig Start Date End Date Taking? Authorizing Provider  acetaminophen (TYLENOL) 325 MG tablet Take 2 tablets (650 mg total) by mouth every 6 (six) hours as needed for mild pain, fever or headache. 02/13/20  Yes Samella Parr, NP  ARTIFICIAL TEARS 0.2-0.2-1 %  SOLN Place 2 drops into both eyes in the morning and at bedtime.  11/15/19  Yes [provider]  aspirin EC 81 MG tablet Take 81 mg by mouth daily. Swallow whole.   Yes [provider]  Janne Lab Oil [facility]) OINT Apply 1 application topically as directed. Apply to sacrum, coccyx and bilateral buttocks qshift   Yes [provider]  clopidogrel (PLAVIX) 75 MG tablet Take 1 tablet (75 mg total) by mouth daily. 11/08/19  Yes [name], MD  DULoxetine (CYMBALTA) 60 MG capsule Take 1 capsule (60 mg total) by mouth daily. 10/15/19  Yes [name], [name], MD  polyethylene glycol (MIRALAX / GLYCOLAX) 17 g packet Take 34 g by mouth 2 (two) times daily. 09/22/19  Yes [name], MD  rosuvastatin (CRESTOR) 10 MG tablet Take 1 tablet (10 mg total) by mouth daily. 10/16/19  Yes [name], [name], MD  feeding supplement, ENSURE ENLIVE, (ENSURE ENLIVE) LIQD Take 237 mLs by mouth 3 (three) times daily between meals. Patient not taking: No sig reported 11/07/19   [name], MD  Multiple Vitamin (MULTIVITAMIN WITH MINERALS) TABS tablet Take 1 tablet by mouth daily. Patient not taking: No sig reported 03/03/20   Samella Parr, NP  NON FORMULARY Diet NAS Patient not taking: No sig reported 03/20/20   [provider]    Current Facility-Administered Medications  Medication Dose Route Frequency Provider Last Rate Last Admin  . acetaminophen (TYLENOL) tablet 650 mg  650 mg Oral Q6H PRN Emokpae, Ejiroghene E, MD       Or  . acetaminophen (TYLENOL) suppository 650 mg  650 mg Rectal Q6H PRN Emokpae, Ejiroghene E, MD      . Ampicillin-Sulbactam (UNASYN) 3 g in sodium chloride 0.9 % 100 mL IVPB  3 g Intravenous Q6H Emokpae, Ejiroghene E, MD 200 mL/hr at 04/10/20 0557 3 g at 04/10/20 0557  . dextrose 5 % in lactated ringers infusion   Intravenous Continuous Emokpae, Ejiroghene E, MD 75 mL/hr at 04/09/20 2103 New Bag at 04/09/20 2103  . DULoxetine (CYMBALTA) DR capsule 60 mg  60  mg Oral Daily Emokpae, Ejiroghene E, MD      . ondansetron (ZOFRAN) tablet 4 mg  4 mg Oral Q6H PRN Emokpae, Ejiroghene E, MD       Or  . ondansetron (ZOFRAN) injection 4 mg  4 mg Intravenous Q6H PRN Emokpae, Ejiroghene E, MD      . pantoprazole (PROTONIX) injection 40 mg  40 mg Intravenous Q12H Emokpae, Ejiroghene E, MD      . polyethylene glycol (MIRALAX / GLYCOLAX) packet 17 g  17 g Oral Daily PRN Emokpae, Ejiroghene E, MD      . rosuvastatin (CRESTOR) tablet 10 mg  10 mg Oral Daily Emokpae, Ejiroghene E, MD   10 mg at 04/09/20 2106    Allergies as of 04/09/2020  . (No Known Allergies)    Family History  Problem Relation Age of Onset  . Diabetes Mother   . Diabetes Brother   . Diabetes Sister     Social History   Socioeconomic History  . Marital status: Widowed  Spouse name: Not on file  . Number of children: Not on file  . Years of education: Not on file  . Highest education level: Not on file  Occupational History  . Not on file  Tobacco Use  . Smoking status: Current Every Day Smoker    Packs/day: 0.75    Types: Cigarettes  . Smokeless tobacco: Never Used  Vaping Use  . Vaping Use: Never used  Substance and Sexual Activity  . Alcohol use: No  . Drug use: Never  . Sexual activity: Not on file  Other Topics Concern  . Not on file  Social History Narrative  . Not on file   Social Determinants of Health   Financial Resource Strain: Not on file  Food Insecurity: Not on file  Transportation Needs: Not on file  Physical Activity: Not on file  Stress: Not on file  Social Connections: Not on file  Intimate Partner Violence: Not on file    Review of Systems:  LIMITED DUE TO DEMENTIA; SEE HPI  Physical Exam: Vital signs in last 24 hours: Temp:  [97.3 F (36.3 C)-97.9 F (36.6 C)] 97.3 F (36.3 C) (03/11 0538) Pulse Rate:  [66-85] 66 (03/11 0538) Resp:  [11-20] 18 (03/11 0538) BP: (104-165)/(70-86) 120/70 (03/11 0538) SpO2:  [99 %-100 %] 99 % (03/11  0538) FiO2 (%):  [28 %] 28 % (03/10 1933) Weight:  [64 kg] 64 kg (03/10 1241)   General:   Alert,  Well-developed, well-nourished, pleasant and cooperative in NAD Head:  Normocephalic and atraumatic. Eyes:  Sclera clear, no icterus. Conjunctiva pink. Ears:  Normal auditory acuity. Neck:  Supple; no masses or thyromegaly. Lungs:  Clear throughout to auscultation.   No wheezes, crackles, or rhonchi. No acute distress. Heart:  Regular rate and rhythm; no murmurs, clicks, rubs,  or gallops. Abdomen:  Soft, nontender and nondistended. No masses, hepatosplenomegaly or hernias noted. Normal bowel sounds, without guarding, and without rebound.   Rectal:  Deferred.   Msk:  Symmetrical without gross deformities. Pulses:  Normal bilateral DP pulses noted. Extremities:  Without clubbing or edema. Neurologic:  Alert and  oriented x4;  grossly normal neurologically. Psych:  Alert and cooperative. Normal mood and affect.  Intake/Output from previous day: 03/10 0701 - 03/11 0700 In: 100 [IV Piggyback:100] Out: 550 [Urine:550] Intake/Output this shift: No intake/output data recorded.  Lab Results: Recent Labs    04/09/20 1353 04/09/20 1956 04/09/20 2355 04/10/20 0542  WBC 10.9*  --  11.0* 7.8  HGB 11.8* 10.9* 9.9* 8.6*  HCT 40.0 36.0* 32.4* 29.2*  PLT 194  --  164 156   BMET Recent Labs    04/09/20 1353  NA 141  K 4.8  CL 108  CO2 25  GLUCOSE 99  BUN 32*  CREATININE 1.03  CALCIUM 8.8*   LFT Recent Labs    04/09/20 1353  PROT 6.5  ALBUMIN 3.2*  AST 16  ALT 11  ALKPHOS 63  BILITOT 0.7   PT/INR No results for input(s): LABPROT, INR in the last 72 hours. Hepatitis Panel No results for input(s): HEPBSAG, HCVAB, HEPAIGM, HEPBIGM in the last 72 hours. C-Diff No results for input(s): CDIFFTOX in the last 72 hours.  Studies/Results: CT ABDOMEN PELVIS W CONTRAST  Result Date: 04/09/2020 CLINICAL DATA:  Bright red blood per rectum today. Diverticulitis suspected. EXAM: CT  ABDOMEN AND PELVIS WITH CONTRAST TECHNIQUE: Multidetector CT imaging of the abdomen and pelvis was performed using the standard protocol following bolus administration of intravenous contrast.  CONTRAST:  8mL OMNIPAQUE IOHEXOL 300 MG/ML  SOLN COMPARISON:  CT 11/22/2017 FINDINGS: Lower chest: Consolidative opacity in the dependent right lower lobe. Minimal patchy opacity in the dependent left lower lobe. Small right pleural effusion. Heart is normal in size. Hepatobiliary: Stable 13 mm enhancing lesion in the left lobe of the liver, previously characterized as focal nodular hyperplasia. No new liver lesion. Multiple layering gallstones. Difficult to evaluate for wall thickening due to motion through the gallbladder. There is no biliary dilatation. Pancreas: No ductal dilatation or inflammation. Spleen: Normal in size. Tiny low-density lesions in the spleen are similar to prior, typically benign. Adrenals/Urinary Tract: Left greater than right adrenal thickening without dominant adrenal nodule. There is multifocal right renal scarring and thinning of the renal parenchyma. Minimal scarring in the mid left [name]. Bilateral renal cortical cysts. No evidence of solid lesion. There is symmetric excretion on delayed phase imaging. Urinary bladder is partially distended. Wall thickening is noted at the bladder base. Small basilar bladder diverticulum. Stomach/Bowel: Bowel evaluation is limited in the absence of enteric contrast, paucity of intra-abdominal fat, and patient motion. Decompressed stomach. Equivocal pre pyloric gastric wall thickening. There is no small bowel obstruction, evidence of inflammation or wall thickening. Portions of normal appendix are visualized. No appendicitis. Moderate volume of stool throughout the colon. There is mild wall thickening of the rectum. No other colonic wall thickening. No significant diverticular disease. Examination performed in the late arterial phase, and no accumulation of  contrast within the GI tract to localize site of GI bleed. Vascular/Lymphatic: Moderate aortic atherosclerosis. No aneurysm. Patent portal vein. No abdominopelvic adenopathy. Reproductive: Enlarged prostate gland spanning 5.5 cm transverse. Other: Minimal presacral edema. No ascites or free air. Minimal fat in the right greater than left inguinal canal. Musculoskeletal: Large anterior spurs in the thoracic and lumbar spine. Enlarged left transverse process of L5 and pseudoarticulation with the sacrum. Bilateral femoral head avascular necrosis without subchondral collapse. Remote right rib fractures. No acute osseous abnormalities are seen. IMPRESSION: 1. Mild wall thickening of the rectum, may represent proctitis. No other explanation for GI bleed. No significant diverticular disease. 2. Consolidative opacity in the dependent right lower lobe with small right pleural effusion. Findings may represent pneumonia or aspiration. Minimal patchy left basilar opacity. 3. Equivocal pre pyloric gastric wall thickening, can be seen with gastritis. 4. Cholelithiasis. Limited assessment for gallbladder inflammation given motion. 5. Bilateral renal scarring, right greater than left. 6. Enlarged prostate gland. Wall thickening at the bladder base may be related to chronic bladder outlet obstruction. Small bladder diverticula. 7. Bilateral femoral head avascular necrosis without subchondral collapse. Aortic Atherosclerosis (ICD10-I70.0). Electronically Signed   By: Keith Rake M.D.   On: 04/09/2020 16:41    Impression: 80 year old male with significant dementia presents from the nursing home with significant rectal bleeding for 2 to 3 days and now with decline in hemoglobin from 11 to 8.6.  CT with mild wall thickening of the rectum that may represent proctitis, no other explanation for rectal bleeding.  No previous colonoscopy or endoscopy. States he was told he has had bleeding but hasn't looked. No previous problems of  this nature. States his sister helps with him and is ok if we contact her.  Differentials include rectal cancer, benign anorectal source for bleeding/proctitis (sterocorral colitis?), less likely infectious etiology (no WBC count, fever).  Discussed brief overview of possible flex sig and he is willing to proceed.  Plan: 1. Consider flex sig for tissue diagnosis of colitis 2.  Will need to discuss with sister to what extent they want to evaluate 3. Remain NPO for now 4. Further recommendations to follow    ADDENDUM:  I spoke with the patient's sister [name] [name] (phone number [phone]) and explained the situation and possible need for flex sig. She has just had surgery and is not sure if she'll be able to be there tomorrow morning. However, she is agreeable to the procedure to find out why he's having problems. Informed her a nurse will be calling at some point to get phone consent given patient's dementia. She verbalized understanding.   Thank you for allowing [place] to participate in the care of [facility]  Walden Field, DNP, AGNP-C Adult & Gerontological Nurse Practitioner [facility]   LOS: 0 days     04/10/2020, 8:45 AM

## 2020-04-10 NOTE — Evaluation (Signed)
Clinical/Bedside Swallow Evaluation Patient Details  Name: [name] [name] MRN: [id number] Date of Birth: 11-06-1940  Today's Date: 04/10/2020 Time: SLP Start Time (ACUTE ONLY): 4315 SLP Stop Time (ACUTE ONLY): 1013 SLP Time Calculation (min) (ACUTE ONLY): 31 min  Past Medical History:  Past Medical History:  Diagnosis Date  . Anemia   . Back pain   . Cerebral infarction ([place])   . Dysarthria following cerebral infarction   . Dysphagia following cerebral infarction   . Hyperlipemia   . Major depressive disorder   . Pyothorax ([place])   . Renal disorder    kidney stones  . Spinal stenosis, lumbar region, with neurogenic claudication 07/07/2014  . Unspecified dementia without behavioral disturbance [facility]    Past Surgical History:  Past Surgical History:  Procedure Laterality Date  . CATARACT EXTRACTION W/PHACO Left 08/22/2016   Procedure: CATARACT EXTRACTION PHACO AND INTRAOCULAR LENS PLACEMENT ([place]) Left;  Surgeon: [name], MD;  Location: [facility];  Service: Ophthalmology;  Laterality: Left;  . CATARACT EXTRACTION W/PHACO Right 10/25/2016   Procedure: CATARACT EXTRACTION PHACO AND INTRAOCULAR LENS PLACEMENT ([place]) WITH ISTENT RIGHT  ;  Surgeon: [name], MD;  Location: [facility];  Service: Ophthalmology;  Laterality: Right;  TOPICAL RIGHT  ISTENT   trabecular bypass stent was implanted/explanted.  . INGUINAL HERNIA REPAIR    . IR PERC PLEURAL DRAIN W/INDWELL CATH W/IMG GUIDE  01/03/2020  . KNEE SURGERY Right   . NECK SURGERY     HPI:  80 year old man PMH lacunar infarct October 2021 with residual dysarthria, hypertension, hyperlipidemia, spinal stenosis, depression, & significant dementia. Pt presented to the ED from the [facility] with 2 days of bright red blood per rectum which the nursing staff noted large amount. ED work up and CXR revealed: consolidation dependent right lower lobe with small pleural effusion possible pneumonia  versus aspiration for which she was started on Unasyn.  GI was consulted and recommended clear liquid diet. Recent prolonged hospitalization 01/02/2020 through 03/20/2019. Bedside Swallowing Evaluation evaluation. This Pt was evaluated by speech therapy in October and November of 2021 with recommendation for D2/ thin liquids and meds to be administered whole in applesauce.   Assessment / Plan / Recommendation Clinical Impression  Clinical Swallowing evaluation completed while Pt was sitting upright in bed; granted permission from attending to assess solids, requested secondary to Pt being on a clear liquid diet. Pt's presentation today is similar to BSE completed in November of 2021. Pt continue to have general oral motor weakness from previous stroke and note missing dentition; those remaining are in very poor condition. Pt was without overt s/sx of dysphagia or aspiration with trials. GI NP entered during ST evaluation and reported that Pt may be having a procedure later today therefore trials were terminated and solid food removed from the Patient. Recommend D2/fine chop diet and thin liquid diet with meds administered whole with applesauce, however, defer to GI to initiate diet when medically appropriate. ST will follow acutely to ensure diet tolerance after it has been initiated. Thank you, SLP Visit Diagnosis: Dysphagia, unspecified (R13.10)    Aspiration Risk  Mild aspiration risk    Diet Recommendation Dysphagia 2 (Fine chop);Thin liquid   Liquid Administration via: Straw Medication Administration: Whole meds with puree Compensations: Minimize environmental distractions;Slow rate;Small sips/bites Postural Changes: Seated upright at 90 degrees    Other  Recommendations Oral Care Recommendations: Oral care BID   Follow up Recommendations Skilled Nursing facility  Frequency and Duration min 1 x/week  1 week       Prognosis Prognosis for Safe Diet Advancement: Good Barriers to Reach  Goals: Cognitive deficits      Swallow Study   General Date of Onset: 04/09/20 HPI: 80 year old man PMH lacunar infarct October 2021 with residual dysarthria, hypertension, hyperlipidemia, spinal stenosis, depression, & significant dementia. Pt presented to the ED from the [facility] with 2 days of bright red blood per rectum which the nursing staff noted large amount. ED work up and CXR revealed: consolidation dependent right lower lobe with small pleural effusion possible pneumonia versus aspiration for which she was started on Unasyn.  GI was consulted and recommended clear liquid diet. Recent prolonged hospitalization 01/02/2020 through 03/20/2019. Bedside Swallowing Evaluation evaluation; SLP granted permission from attending to assess solid textures- requested secondary to current recommendation for clear liquid diet. This Pt was evaluated by speech therapy in October and November of 2021 with recommendation for D2/ thin liquids and meds to be administered whole in applesauce. Previous Swallow Assessment: BSE 10/21 & 11/21 recommending D2/thin Diet Prior to this Study: Thin liquids Temperature Spikes Noted: No Respiratory Status: Nasal cannula History of Recent Intubation: No Behavior/Cognition: Alert;Cooperative;Confused Oral Cavity - Dentition: Poor condition;Missing dentition Vision: Functional for self-feeding Self-Feeding Abilities: Able to feed self;Needs set up Patient Positioning: Upright in bed Baseline Vocal Quality: Normal Volitional Cough: Cognitively unable to elicit Volitional Swallow: Unable to elicit    Oral/Motor/Sensory Function     Ice Chips Ice chips: Within functional limits   Thin Liquid Thin Liquid: Within functional limits    Nectar Thick Nectar Thick Liquid: Not tested   Honey Thick Honey Thick Liquid: Not tested   Puree Puree: Within functional limits   Solid     Solid: Impaired Presentation: Self Fed Oral Phase Impairments: Poor awareness of  bolus;Reduced lingual movement/coordination;Impaired mastication Oral Phase Functional Implications: Prolonged oral transit;Oral residue     Cayci Mcnabb H. Roddie Mc, CCC-SLP Speech Language Pathologist  Wende Bushy 04/10/2020,10:44 AM

## 2020-04-10 NOTE — Progress Notes (Signed)
Spoke to patient sister Karder Goodin who gave phone consent for colonoscopy tomorrow.   Patient has had one occurrence of dark red and black stool smear.

## 2020-04-10 NOTE — Progress Notes (Addendum)
PROGRESS NOTE    [name] [name]  NOB:[id number] DOB: Oct 20, 1940 DOA: 04/09/2020 PCP: [name], [name], MD   Brief Narrative:  [name] [name] is a 80 y.o. male with medical history significant for hypertension, CVA, depression and dementia, empyema.  He was noted to have recent discharge in 03/2020 for acute hypoxemic respiratory failure secondary to right-sided parapneumonic effusion/empyema status post chest tube placement.  He has returned to the hospital with 2 days worth of bright red blood per rectum from [facility].  Hemoglobin levels appear stable.  GI evaluation pending with plans for possible flex sigmoidoscopy noted.  Assessment & Plan:   Principal Problem:   Rectal bleeding Active Problems:   HTN (hypertension)   Dementia associated with other underlying disease without behavioral disturbance (HCC)   Major depression, recurrent, chronic (HCC)   Rectal bleed -No significant anemia noted on repeat labs -Appreciate GI evaluation with consideration for flex sigmoidoscopy -Clear liquid diet n.p.o. for now -IV PPI twice daily  Mild hypomagnesemia -Replete and reevaluate  Possible aspiration pneumonia -Started on IV Unasyn -Leukocytosis has resolved -Plan to check procalcitonin  Hypertension-stable -Not currently on antihypertensives  Depression/dementia -Continue Cymbalta  Dyslipidemia -Continue Crestor   DVT prophylaxis: SCDs Code Status: DNR Family Communication: Discussed with sister on phone 3/11 Disposition Plan:  Status is: Observation  The patient will require care spanning > 2 midnights and should be moved to inpatient because: IV treatments appropriate due to intensity of illness or inability to take PO and Inpatient level of care appropriate due to severity of illness  Dispo: The patient is from: SNF              Anticipated d/c is to: SNF              Patient currently is not medically stable to d/c.   Difficult to place patient  No   Consultants:   GI  Procedures:   See below  Antimicrobials:  Anti-infectives (From admission, onward)   Start     Dose/Rate Route Frequency Ordered Stop   04/10/20 0000  Ampicillin-Sulbactam (UNASYN) 3 g in sodium chloride 0.9 % 100 mL IVPB        3 g 200 mL/hr over 30 Minutes Intravenous Every 6 hours 04/09/20 1916     04/09/20 1700  Ampicillin-Sulbactam (UNASYN) 3 g in sodium chloride 0.9 % 100 mL IVPB        3 g 200 mL/hr over 30 Minutes Intravenous  Once 04/09/20 1657 04/09/20 1844       Subjective: Patient seen and evaluated today with no new acute complaints or concerns. No acute concerns or events noted overnight.  He is a difficult historian on account of his dementia.  Objective: Vitals:   04/09/20 2105 04/09/20 2144 04/10/20 0137 04/10/20 0538  BP: (!) 146/82 (!) 155/73 (!) 154/78 120/70  Pulse: 73 73 74 66  Resp: 16 20 18 18   Temp: 97.9 F (36.6 C)  (!) 97.3 F (36.3 C) (!) 97.3 F (36.3 C)  TempSrc: Oral     SpO2: 100% 99% 100% 99%  Weight:      Height:        Intake/Output Summary (Last 24 hours) at 04/10/2020 1228 Last data filed at 04/10/2020 0535 Gross per 24 hour  Intake 100 ml  Output 550 ml  Net -450 ml   Filed Weights   04/09/20 1241  Weight: 64 kg    Examination:  General exam: Appears calm and comfortable, confused Respiratory  system: Clear to auscultation. Respiratory effort normal.  Currently on 1 L nasal cannula Cardiovascular system: S1 & S2 heard, RRR.  Gastrointestinal system: Abdomen is soft Central nervous system: Alert and awake Extremities: No edema Skin: No significant lesions noted Psychiatry: Flat affect.    Data Reviewed: I have personally reviewed following labs and imaging studies  CBC: Recent Labs  Lab 04/09/20 1353 04/09/20 1956 04/09/20 2355 04/10/20 0542 04/10/20 1151  WBC 10.9*  --  11.0* 7.8 8.4  NEUTROABS 8.3*  --   --   --   --   HGB 11.8* 10.9* 9.9* 8.6* 10.1*  HCT 40.0 36.0* 32.4* 29.2*  33.1*  MCV 87.0  --  85.9 88.5 86.6  PLT 194  --  164 156 283   Basic Metabolic Panel: Recent Labs  Lab 04/09/20 1353 04/09/20 2355  NA 141  --   K 4.8  --   CL 108  --   CO2 25  --   GLUCOSE 99  --   BUN 32*  --   CREATININE 1.03  --   CALCIUM 8.8*  --   MG  --  1.6*   GFR: Estimated Creatinine Clearance: 51.4 mL/min (by C-G formula based on SCr of 1.03 mg/dL). Liver Function Tests: Recent Labs  Lab 04/09/20 1353  AST 16  ALT 11  ALKPHOS 63  BILITOT 0.7  PROT 6.5  ALBUMIN 3.2*   Recent Labs  Lab 04/09/20 1353  LIPASE 23   No results for input(s): AMMONIA in the last 168 hours. Coagulation Profile: No results for input(s): INR, PROTIME in the last 168 hours. Cardiac Enzymes: No results for input(s): CKTOTAL, CKMB, CKMBINDEX, TROPONINI in the last 168 hours. BNP (last 3 results) No results for input(s): PROBNP in the last 8760 hours. HbA1C: No results for input(s): HGBA1C in the last 72 hours. CBG: No results for input(s): GLUCAP in the last 168 hours. Lipid Profile: No results for input(s): CHOL, HDL, LDLCALC, TRIG, CHOLHDL, LDLDIRECT in the last 72 hours. Thyroid Function Tests: No results for input(s): TSH, T4TOTAL, FREET4, T3FREE, THYROIDAB in the last 72 hours. Anemia Panel: No results for input(s): VITAMINB12, FOLATE, FERRITIN, TIBC, IRON, RETICCTPCT in the last 72 hours. Sepsis Labs: No results for input(s): PROCALCITON, LATICACIDVEN in the last 168 hours.  Recent Results (from the past 240 hour(s))  Resp Panel by RT-PCR (Flu A&B, Covid) Nasopharyngeal Swab     Status: None   Collection Time: 04/09/20  4:49 PM   Specimen: Nasopharyngeal Swab; Nasopharyngeal(NP) swabs in vial transport medium  Result Value Ref Range Status   SARS Coronavirus 2 by RT PCR NEGATIVE NEGATIVE Final    Comment: (NOTE) SARS-CoV-2 target nucleic acids are NOT DETECTED.  The SARS-CoV-2 RNA is generally detectable in upper respiratory specimens during the acute phase of  infection. The lowest concentration of SARS-CoV-2 viral copies this assay can detect is 138 copies/mL. A negative result does not preclude SARS-Cov-2 infection and should not be used as the sole basis for treatment or other patient management decisions. A negative result may occur with  improper specimen collection/handling, submission of specimen other than nasopharyngeal swab, presence of viral mutation(s) within the areas targeted by this assay, and inadequate number of viral copies(<138 copies/mL). A negative result must be combined with clinical observations, patient history, and epidemiological information. The expected result is Negative.  Fact Sheet for Patients:  EntrepreneurPulse.com.au  Fact Sheet for Healthcare Providers:  IncredibleEmployment.be  This test is no t yet approved or cleared  by the [place] and  has been authorized for detection and/or diagnosis of SARS-CoV-2 by FDA under an Emergency Use Authorization (EUA). This EUA will remain  in effect (meaning this test can be used) for the duration of the COVID-19 declaration under Section 564(b)(1) of the Act, 21 U.S.C.section 360bbb-3(b)(1), unless the authorization is terminated  or revoked sooner.       Influenza A by PCR NEGATIVE NEGATIVE Final   Influenza B by PCR NEGATIVE NEGATIVE Final    Comment: (NOTE) The Xpert Xpress SARS-CoV-2/FLU/RSV plus assay is intended as an aid in the diagnosis of influenza from Nasopharyngeal swab specimens and should not be used as a sole basis for treatment. Nasal washings and aspirates are unacceptable for Xpert Xpress SARS-CoV-2/FLU/RSV testing.  Fact Sheet for Patients: EntrepreneurPulse.com.au  Fact Sheet for Healthcare Providers: IncredibleEmployment.be  This test is not yet approved or cleared by the [place] FDA and has been authorized for detection and/or diagnosis of SARS-CoV-2  by FDA under an Emergency Use Authorization (EUA). This EUA will remain in effect (meaning this test can be used) for the duration of the COVID-19 declaration under Section 564(b)(1) of the Act, 21 U.S.C. section 360bbb-3(b)(1), unless the authorization is terminated or revoked.  Performed at [facility], [address], [place],  [address]          Radiology Studies: CT ABDOMEN PELVIS W CONTRAST  Result Date: 04/09/2020 CLINICAL DATA:  Bright red blood per rectum today. Diverticulitis suspected. EXAM: CT ABDOMEN AND PELVIS WITH CONTRAST TECHNIQUE: Multidetector CT imaging of the abdomen and pelvis was performed using the standard protocol following bolus administration of intravenous contrast. CONTRAST:  97mL OMNIPAQUE IOHEXOL 300 MG/ML  SOLN COMPARISON:  CT 11/22/2017 FINDINGS: Lower chest: Consolidative opacity in the dependent right lower lobe. Minimal patchy opacity in the dependent left lower lobe. Small right pleural effusion. Heart is normal in size. Hepatobiliary: Stable 13 mm enhancing lesion in the left lobe of the liver, previously characterized as focal nodular hyperplasia. No new liver lesion. Multiple layering gallstones. Difficult to evaluate for wall thickening due to motion through the gallbladder. There is no biliary dilatation. Pancreas: No ductal dilatation or inflammation. Spleen: Normal in size. Tiny low-density lesions in the spleen are similar to prior, typically benign. Adrenals/Urinary Tract: Left greater than right adrenal thickening without dominant adrenal nodule. There is multifocal right renal scarring and thinning of the renal parenchyma. Minimal scarring in the mid left kidney. Bilateral renal cortical cysts. No evidence of solid lesion. There is symmetric excretion on delayed phase imaging. Urinary bladder is partially distended. Wall thickening is noted at the bladder base. Small basilar bladder diverticulum. Stomach/Bowel: Bowel evaluation is limited in  the absence of enteric contrast, paucity of intra-abdominal fat, and patient motion. Decompressed stomach. Equivocal pre pyloric gastric wall thickening. There is no small bowel obstruction, evidence of inflammation or wall thickening. Portions of normal appendix are visualized. No appendicitis. Moderate volume of stool throughout the colon. There is mild wall thickening of the rectum. No other colonic wall thickening. No significant diverticular disease. Examination performed in the late arterial phase, and no accumulation of contrast within the GI tract to localize site of GI bleed. Vascular/Lymphatic: Moderate aortic atherosclerosis. No aneurysm. Patent portal vein. No abdominopelvic adenopathy. Reproductive: Enlarged prostate gland spanning 5.5 cm transverse. Other: Minimal presacral edema. No ascites or free air. Minimal fat in the right greater than left inguinal canal. Musculoskeletal: Large anterior spurs in the thoracic and lumbar spine. Enlarged left  transverse process of L5 and pseudoarticulation with the sacrum. Bilateral femoral head avascular necrosis without subchondral collapse. Remote right rib fractures. No acute osseous abnormalities are seen. IMPRESSION: 1. Mild wall thickening of the rectum, may represent proctitis. No other explanation for GI bleed. No significant diverticular disease. 2. Consolidative opacity in the dependent right lower lobe with small right pleural effusion. Findings may represent pneumonia or aspiration. Minimal patchy left basilar opacity. 3. Equivocal pre pyloric gastric wall thickening, can be seen with gastritis. 4. Cholelithiasis. Limited assessment for gallbladder inflammation given motion. 5. Bilateral renal scarring, right greater than left. 6. Enlarged prostate gland. Wall thickening at the bladder base may be related to chronic bladder outlet obstruction. Small bladder diverticula. 7. Bilateral femoral head avascular necrosis without subchondral collapse. Aortic  Atherosclerosis (ICD10-I70.0). Electronically Signed   By: Keith Rake M.D.   On: 04/09/2020 16:41        Scheduled Meds: . DULoxetine  60 mg Oral Daily  . pantoprazole (PROTONIX) IV  40 mg Intravenous Q12H  . rosuvastatin  10 mg Oral Daily   Continuous Infusions: . ampicillin-sulbactam (UNASYN) IV 3 g (04/10/20 0557)  . magnesium sulfate bolus IVPB       LOS: 0 days    Time spent: 35 minutes    Pratik Darleen Crocker, DO [facility]  If 7PM-7AM, please contact night-coverage [email or web address] 04/10/2020, 12:28 PM

## 2020-04-10 NOTE — Progress Notes (Signed)
Sheriff delivered papers for upcoming hearing to establish legal guardianship. Hearing is on Feb 17 by webex.  Called and spoke with staff at [facility]  let them know and they will let their social workers know.  Official paperwork is in patient belonging bag in his room here.Marland KitchenMarland KitchenAltus

## 2020-04-11 ENCOUNTER — Encounter (HOSPITAL_COMMUNITY)
Admission: EM | Disposition: A | Discharge status: Skilled Nursing Facility | Payer: Self-pay | Source: Skilled Nursing Facility | Attending: Internal Medicine

## 2020-04-11 ENCOUNTER — Inpatient Hospital Stay (HOSPITAL_COMMUNITY): Payer: Medicare HMO | Admitting: Anesthesiology

## 2020-04-11 ENCOUNTER — Other Ambulatory Visit: Payer: Self-pay

## 2020-04-11 DIAGNOSIS — K625 Hemorrhage of anus and rectum: Secondary | ICD-10-CM | POA: Diagnosis not present

## 2020-04-11 HISTORY — PX: FLEXIBLE SIGMOIDOSCOPY: SHX5431

## 2020-04-11 LAB — BASIC METABOLIC PANEL
Anion gap: 7 (ref 5–15)
BUN: 15 mg/dL (ref 8–23)
CO2: 26 mmol/L (ref 22–32)
Calcium: 8.4 mg/dL — ABNORMAL LOW (ref 8.9–10.3)
Chloride: 106 mmol/L (ref 98–111)
Creatinine, Ser: 1.05 mg/dL (ref 0.61–1.24)
GFR, Estimated: >60 mL/min (ref >60)
Glucose, Bld: 82 mg/dL (ref 70–99)
Potassium: 4 mmol/L (ref 3.5–5.1)
Sodium: 139 mmol/L (ref 135–145)

## 2020-04-11 LAB — CBC
HCT: 31.9 % — ABNORMAL LOW (ref 39.0–52.0)
Hemoglobin: 9.5 g/dL — ABNORMAL LOW (ref 13.0–17.0)
MCH: 25.7 pg — ABNORMAL LOW (ref 26.0–34.0)
MCHC: 29.8 g/dL — ABNORMAL LOW (ref 30.0–36.0)
MCV: 86.2 fL (ref 80.0–100.0)
Platelets: 178 10*3/uL (ref 150–400)
RBC: 3.7 MIL/uL — ABNORMAL LOW (ref 4.22–5.81)
RDW: 15.1 % (ref 11.5–15.5)
WBC: 7.3 10*3/uL (ref 4.0–10.5)
nRBC: 0 % (ref 0.0–0.2)

## 2020-04-11 LAB — MAGNESIUM: Magnesium: 1.8 mg/dL (ref 1.7–2.4)

## 2020-04-11 SURGERY — SIGMOIDOSCOPY, FLEXIBLE
Anesthesia: General

## 2020-04-11 MED ORDER — PROPOFOL 10 MG/ML IV BOLUS
INTRAVENOUS | Status: AC
  Filled 2020-04-11: qty 20

## 2020-04-11 MED ORDER — PEG 3350-KCL-NABCB-NACL-NASULF 236 G PO SOLR
4000.0000 mL | Freq: Once | ORAL | Status: AC
  Administered 2020-04-11: 4000 mL via ORAL
  Filled 2020-04-11: qty 4000

## 2020-04-11 MED ORDER — LIDOCAINE HCL (CARDIAC) PF 100 MG/5ML IV SOSY
PREFILLED_SYRINGE | INTRAVENOUS | Status: DC | PRN
Start: 2020-04-11 — End: 2020-04-11
  Administered 2020-04-11 (×2): 20 mg via INTRAVENOUS

## 2020-04-11 MED ORDER — SODIUM CHLORIDE 0.9 % IV SOLN: INTRAVENOUS | Status: DC

## 2020-04-11 MED ORDER — LACTATED RINGERS IV SOLN: INTRAVENOUS | Status: DC | PRN

## 2020-04-11 MED ORDER — PROPOFOL 500 MG/50ML IV EMUL: INTRAVENOUS | Status: DC | PRN

## 2020-04-11 MED ORDER — PROPOFOL 10 MG/ML IV BOLUS
INTRAVENOUS | Status: DC | PRN
  Administered 2020-04-11: 20 mg via INTRAVENOUS
  Administered 2020-04-11: 50 mg via INTRAVENOUS

## 2020-04-11 MED ORDER — STERILE WATER FOR IRRIGATION IR SOLN
Status: DC | PRN
  Administered 2020-04-11: 200 mL

## 2020-04-11 MED ORDER — LIDOCAINE HCL (PF) 2 % IJ SOLN
INTRAMUSCULAR | Status: AC
  Filled 2020-04-11: qty 5

## 2020-04-11 NOTE — Op Note (Signed)
[facility] Patient Name: [name] [name] Procedure Date: 04/11/2020 9:19 AM MRN: [id number] Date of Birth: 06/20/1940 Attending MD: Maylon Peppers ,  CSN: [id number] Age: 80 Admit Type: Outpatient Procedure:                Flexible Sigmoidoscopy Indications:              Hematochezia Providers:                Maylon Peppers, Lurline Del, RN, Casimer Bilis, Technician Referring MD:              Medicines:                Monitored Anesthesia Care Complications:            No immediate complications. Estimated Blood Loss:     Estimated blood loss: none. Procedure:                Pre-Anesthesia Assessment:                           - Prior to the procedure, a History and Physical                            was performed, and patient medications, allergies                            and sensitivities were reviewed. The patient's                            tolerance of previous anesthesia was reviewed.                           - The risks and benefits of the procedure and the                            sedation options and risks were discussed with the                            patient. All questions were answered and informed                            consent was obtained.                           - ASA Grade Assessment: III - A patient with severe                            systemic disease.                           After obtaining informed consent, the scope was                            passed under direct vision. The [id number]-[id number]                            (  [id number]) scope was introduced through the anus and                            advanced to the the descending colon. After                            obtaining informed consent, the scope was passed                            under direct vision.The colonoscopy was performed                            with difficulty due to poor bowel prep. The patient                             tolerated the procedure well. The quality of the                            bowel preparation was poor. Scope withdrawal time                            was 4 minutes. Scope In: 9:45:17 AM Scope Out: 9:49:58 AM Total Procedure Duration: 0 hours 4 minutes 41 seconds  Findings:      The perianal and digital rectal examinations were normal.      Clotted blood and large amount of stool was found in the rectum, in the       recto-sigmoid colon, in the sigmoid colon and in the distal descending       colon. There was no presence of inflammation that could be seen in the       rectum, but this was limited by large amount of stool. Impression:               - Preparation of the colon was poor.                           - Blood and stool in the rectum, in the                            recto-sigmoid colon, in the sigmoid colon and in                            the distal descending colon.                           - No specimens collected. Moderate Sedation:      Per Anesthesia Care Recommendation:           - Return patient to hospital ward for ongoing care.                           - Clear liquid diet.                           - Proceed with colonoscopy tomorrow.                           -  Check H/H daily.                           - Obtain CT abdomen pelvis with IV contrast STAT if                            overt bleeding with hemodynamic instability. Procedure Code(s):        --- Professional ---                           [phone], Sigmoidoscopy, flexible; diagnostic,                            including collection of specimen(s) by brushing or                            washing, when performed (separate procedure) Diagnosis Code(s):        --- Professional ---                           K62.5, Hemorrhage of anus and rectum                           K92.2, Gastrointestinal hemorrhage, unspecified                           K92.1, Melena (includes Hematochezia) CPT copyright 2019 American  Medical Association. All rights reserved. The codes documented in this report are preliminary and upon coder review may  be revised to meet current compliance requirements. Maylon Peppers, MD Maylon Peppers,  04/11/2020 10:05:26 AM This report has been signed electronically. Number of Addenda: 0

## 2020-04-11 NOTE — Progress Notes (Signed)
PROGRESS NOTE    [name] [name]  RXV:[id number] DOB: 1940/09/02 DOA: 04/09/2020 PCP: [name], [name], MD   Brief Narrative:  [name] [name] a 80 y.o.malewith medical history significant forhypertension, CVA, depression and dementia, empyema.  He was noted to have recent discharge in 03/2020 for acute hypoxemic respiratory failure secondary to right-sided parapneumonic effusion/empyema status post chest tube placement.  He has returned to the hospital with 2 days worth of bright red blood per rectum from [facility].  Hemoglobin levels appear stable.  GI evaluation with flex sigmoidoscopy performed 3/12, but study was limited.  Plans for colonoscopy on 3/13 per GI.   Assessment & Plan:   Principal Problem:   Rectal bleeding Active Problems:   HTN (hypertension)   Dementia associated with other underlying disease without behavioral disturbance (HCC)   Major depression, recurrent, chronic (HCC)   Proctitis   Acute anemia   Rectal bleed-stable -No significant anemia noted on repeat labs -GI with flex sigmoidoscopy on 3/12, but this was limited.  Plans for colonoscopy on 3/13 -Clear liquid diet -IV PPI twice daily -Repeat H&H in a.m.  Possible aspiration pneumonia -Procalcitonin noted to be low and Unasyn discontinued on 3/12  Hypertension-stable -Not currently on antihypertensives  Depression/dementia -Continue Cymbalta  Dyslipidemia -Continue Crestor   DVT prophylaxis: SCDs Code Status: DNR Family Communication: Discussed with sister on phone 3/12 Disposition Plan:  Status is: Inpatient  Remains inpatient appropriate because:Ongoing diagnostic testing needed not appropriate for outpatient work up and IV treatments appropriate due to intensity of illness or inability to take PO   Dispo: The patient is from: SNF              Anticipated d/c is to: SNF              Patient currently is not medically stable to d/c.   Difficult to place  patient No  Consultants:   GI  Procedures:   See below  Antimicrobials:  Anti-infectives (From admission, onward)   Start     Dose/Rate Route Frequency Ordered Stop   04/10/20 0000  Ampicillin-Sulbactam (UNASYN) 3 g in sodium chloride 0.9 % 100 mL IVPB  Status:  Discontinued        3 g 200 mL/hr over 30 Minutes Intravenous Every 6 hours 04/09/20 1916 04/11/20 1031   04/09/20 1700  Ampicillin-Sulbactam (UNASYN) 3 g in sodium chloride 0.9 % 100 mL IVPB        3 g 200 mL/hr over 30 Minutes Intravenous  Once 04/09/20 1657 04/09/20 1844       Subjective: Patient seen and evaluated today with no new acute complaints or concerns. No acute concerns or events noted overnight.  Objective: Vitals:   04/10/20 2020 04/11/20 0900 04/11/20 1004 04/11/20 1015  BP: (!) 136/54 (!) 148/89 126/75 132/77  Pulse: 72 61 67 (!) 59  Resp: 18 16 15 13   Temp: 98.2 F (36.8 C) (!) 97 F (36.1 C) 97.6 F (36.4 C)   TempSrc: Oral Oral    SpO2: 98%  100% 95%  Weight:      Height:        Intake/Output Summary (Last 24 hours) at 04/11/2020 1032 Last data filed at 04/11/2020 0956 Gross per 24 hour  Intake 540 ml  Output 500 ml  Net 40 ml   Filed Weights   04/09/20 1241  Weight: 64 kg    Examination:  General exam: Appears calm and comfortable  Respiratory system: Clear to auscultation. Respiratory effort  normal.  On nasal cannula oxygen Cardiovascular system: S1 & S2 heard, RRR.  Gastrointestinal system: Abdomen is soft Central nervous system: Alert and awake Extremities: No edema Skin: No significant lesions noted Psychiatry: Flat affect.    Data Reviewed: I have personally reviewed following labs and imaging studies  CBC: Recent Labs  Lab 04/09/20 1353 04/09/20 1956 04/09/20 2355 04/10/20 0542 04/10/20 1151 04/11/20 0506  WBC 10.9*  --  11.0* 7.8 8.4 7.3  NEUTROABS 8.3*  --   --   --   --   --   HGB 11.8* 10.9* 9.9* 8.6* 10.1* 9.5*  HCT 40.0 36.0* 32.4* 29.2* 33.1*  31.9*  MCV 87.0  --  85.9 88.5 86.6 86.2  PLT 194  --  164 156 191 254   Basic Metabolic Panel: Recent Labs  Lab 04/09/20 1353 04/09/20 2355 04/11/20 0506  NA 141  --  139  K 4.8  --  4.0  CL 108  --  106  CO2 25  --  26  GLUCOSE 99  --  82  BUN 32*  --  15  CREATININE 1.03  --  1.05  CALCIUM 8.8*  --  8.4*  MG  --  1.6* 1.8   GFR: Estimated Creatinine Clearance: 50.4 mL/min (by C-G formula based on SCr of 1.05 mg/dL). Liver Function Tests: Recent Labs  Lab 04/09/20 1353  AST 16  ALT 11  ALKPHOS 63  BILITOT 0.7  PROT 6.5  ALBUMIN 3.2*   Recent Labs  Lab 04/09/20 1353  LIPASE 23   No results for input(s): AMMONIA in the last 168 hours. Coagulation Profile: No results for input(s): INR, PROTIME in the last 168 hours. Cardiac Enzymes: No results for input(s): CKTOTAL, CKMB, CKMBINDEX, TROPONINI in the last 168 hours. BNP (last 3 results) No results for input(s): PROBNP in the last 8760 hours. HbA1C: No results for input(s): HGBA1C in the last 72 hours. CBG: No results for input(s): GLUCAP in the last 168 hours. Lipid Profile: No results for input(s): CHOL, HDL, LDLCALC, TRIG, CHOLHDL, LDLDIRECT in the last 72 hours. Thyroid Function Tests: No results for input(s): TSH, T4TOTAL, FREET4, T3FREE, THYROIDAB in the last 72 hours. Anemia Panel: No results for input(s): VITAMINB12, FOLATE, FERRITIN, TIBC, IRON, RETICCTPCT in the last 72 hours. Sepsis Labs: Recent Labs  Lab 04/10/20 1249  PROCALCITON <0.10    Recent Results (from the past 240 hour(s))  Resp Panel by RT-PCR (Flu A&B, Covid) Nasopharyngeal Swab     Status: None   Collection Time: 04/09/20  4:49 PM   Specimen: Nasopharyngeal Swab; Nasopharyngeal(NP) swabs in vial transport medium  Result Value Ref Range Status   SARS Coronavirus 2 by RT PCR NEGATIVE NEGATIVE Final    Comment: (NOTE) SARS-CoV-2 target nucleic acids are NOT DETECTED.  The SARS-CoV-2 RNA is generally detectable in upper  respiratory specimens during the acute phase of infection. The lowest concentration of SARS-CoV-2 viral copies this assay can detect is 138 copies/mL. A negative result does not preclude SARS-Cov-2 infection and should not be used as the sole basis for treatment or other patient management decisions. A negative result may occur with  improper specimen collection/handling, submission of specimen other than nasopharyngeal swab, presence of viral mutation(s) within the areas targeted by this assay, and inadequate number of viral copies(<138 copies/mL). A negative result must be combined with clinical observations, patient history, and epidemiological information. The expected result is Negative.  Fact Sheet for Patients:  EntrepreneurPulse.com.au  Fact Sheet for Healthcare  Providers:  IncredibleEmployment.be  This test is no t yet approved or cleared by the [place] and  has been authorized for detection and/or diagnosis of SARS-CoV-2 by FDA under an Emergency Use Authorization (EUA). This EUA will remain  in effect (meaning this test can be used) for the duration of the COVID-19 declaration under Section 564(b)(1) of the Act, 21 U.S.C.section 360bbb-3(b)(1), unless the authorization is terminated  or revoked sooner.       Influenza A by PCR NEGATIVE NEGATIVE Final   Influenza B by PCR NEGATIVE NEGATIVE Final    Comment: (NOTE) The Xpert Xpress SARS-CoV-2/FLU/RSV plus assay is intended as an aid in the diagnosis of influenza from Nasopharyngeal swab specimens and should not be used as a sole basis for treatment. Nasal washings and aspirates are unacceptable for Xpert Xpress SARS-CoV-2/FLU/RSV testing.  Fact Sheet for Patients: EntrepreneurPulse.com.au  Fact Sheet for Healthcare Providers: IncredibleEmployment.be  This test is not yet approved or cleared by the [place] FDA and has been  authorized for detection and/or diagnosis of SARS-CoV-2 by FDA under an Emergency Use Authorization (EUA). This EUA will remain in effect (meaning this test can be used) for the duration of the COVID-19 declaration under Section 564(b)(1) of the Act, 21 U.S.C. section 360bbb-3(b)(1), unless the authorization is terminated or revoked.  Performed at [facility], [address], [place], [place] [address]          Radiology Studies: CT ABDOMEN PELVIS W CONTRAST  Result Date: 04/09/2020 CLINICAL DATA:  Bright red blood per rectum today. Diverticulitis suspected. EXAM: CT ABDOMEN AND PELVIS WITH CONTRAST TECHNIQUE: Multidetector CT imaging of the abdomen and pelvis was performed using the standard protocol following bolus administration of intravenous contrast. CONTRAST:  64mL OMNIPAQUE IOHEXOL 300 MG/ML  SOLN COMPARISON:  CT 11/22/2017 FINDINGS: Lower chest: Consolidative opacity in the dependent right lower lobe. Minimal patchy opacity in the dependent left lower lobe. Small right pleural effusion. Heart is normal in size. Hepatobiliary: Stable 13 mm enhancing lesion in the left lobe of the liver, previously characterized as focal nodular hyperplasia. No new liver lesion. Multiple layering gallstones. Difficult to evaluate for wall thickening due to motion through the gallbladder. There is no biliary dilatation. Pancreas: No ductal dilatation or inflammation. Spleen: Normal in size. Tiny low-density lesions in the spleen are similar to prior, typically benign. Adrenals/Urinary Tract: Left greater than right adrenal thickening without dominant adrenal nodule. There is multifocal right renal scarring and thinning of the renal parenchyma. Minimal scarring in the mid left kidney. Bilateral renal cortical cysts. No evidence of solid lesion. There is symmetric excretion on delayed phase imaging. Urinary bladder is partially distended. Wall thickening is noted at the bladder base. Small basilar bladder  diverticulum. Stomach/Bowel: Bowel evaluation is limited in the absence of enteric contrast, paucity of intra-abdominal fat, and patient motion. Decompressed stomach. Equivocal pre pyloric gastric wall thickening. There is no small bowel obstruction, evidence of inflammation or wall thickening. Portions of normal appendix are visualized. No appendicitis. Moderate volume of stool throughout the colon. There is mild wall thickening of the rectum. No other colonic wall thickening. No significant diverticular disease. Examination performed in the late arterial phase, and no accumulation of contrast within the GI tract to localize site of GI bleed. Vascular/Lymphatic: Moderate aortic atherosclerosis. No aneurysm. Patent portal vein. No abdominopelvic adenopathy. Reproductive: Enlarged prostate gland spanning 5.5 cm transverse. Other: Minimal presacral edema. No ascites or free air. Minimal fat in the right greater than left inguinal  canal. Musculoskeletal: Large anterior spurs in the thoracic and lumbar spine. Enlarged left transverse process of L5 and pseudoarticulation with the sacrum. Bilateral femoral head avascular necrosis without subchondral collapse. Remote right rib fractures. No acute osseous abnormalities are seen. IMPRESSION: 1. Mild wall thickening of the rectum, may represent proctitis. No other explanation for GI bleed. No significant diverticular disease. 2. Consolidative opacity in the dependent right lower lobe with small right pleural effusion. Findings may represent pneumonia or aspiration. Minimal patchy left basilar opacity. 3. Equivocal pre pyloric gastric wall thickening, can be seen with gastritis. 4. Cholelithiasis. Limited assessment for gallbladder inflammation given motion. 5. Bilateral renal scarring, right greater than left. 6. Enlarged prostate gland. Wall thickening at the bladder base may be related to chronic bladder outlet obstruction. Small bladder diverticula. 7. Bilateral femoral  head avascular necrosis without subchondral collapse. Aortic Atherosclerosis (ICD10-I70.0). Electronically Signed   By: Keith Rake M.D.   On: 04/09/2020 16:41        Scheduled Meds: . [MAR Hold] DULoxetine  60 mg Oral Daily  . [MAR Hold] pantoprazole (PROTONIX) IV  40 mg Intravenous Q12H  . polyethylene glycol  4,000 mL Oral Once  . [MAR Hold] rosuvastatin  10 mg Oral Daily   Continuous Infusions: . [MAR Hold] sodium chloride 500 mL (04/10/20 1259)     LOS: 1 day    Time spent: 35 minutes    Tobin Cadiente D Manuella Ghazi, DO [facility]  If 7PM-7AM, please contact night-coverage [email or web address] 04/11/2020, 10:32 AM

## 2020-04-11 NOTE — Transfer of Care (Signed)
Immediate Anesthesia Transfer of Care Note  Patient: [name] [name]  Procedure(s) Performed: FLEXIBLE SIGMOIDOSCOPY  Patient Location: PACU  Anesthesia Type:General  Level of Consciousness: awake, alert  and sedated  Airway & Oxygen Therapy: Patient Spontanous Breathing and Patient connected to nasal cannula oxygen  Post-op Assessment: Report given to RN and Post -op Vital signs reviewed and stable  Post vital signs: Reviewed and stable  Last Vitals:  Vitals Value Taken Time  BP 126/75 04/11/20 1006  Temp 97.6 04/11/20 1010  Pulse 65 04/11/20 1006  Resp 13 04/11/20 1006  SpO2 100 % 04/11/20 1006  Vitals shown include unvalidated device data.  Last Pain:  Vitals:   04/11/20 0900  TempSrc: Oral  PainSc:          Complications: No complications documented.

## 2020-04-11 NOTE — Brief Op Note (Signed)
04/09/2020 - 04/11/2020  9:57 AM  PATIENT:  [name] [name]  80 y.o. male  PRE-OPERATIVE DIAGNOSIS:  rectal bleeding  POST-OPERATIVE DIAGNOSIS:  * No post-op diagnosis entered *  PROCEDURE:  Procedure(s): FLEXIBLE SIGMOIDOSCOPY  SURGEON:  Surgeon(s) and Role:    * Harvel Quale, MD - Primary  Performed flexible sigmoidoscopy under propofol sedation.  Rectal exam was within normal limits but there was presence of large amount of clots in his anal orifice.  Upon inspection up to 50 cm from the anal verge, there was presence of large amount of clots and stool.  This may difficult thorough evaluation but there was no presence of inflammation that could be seen in the rectum.  Recommendations: - Return patient to hospital ward for ongoing care.  - Clear liquid diet.  - Proceed with colonoscopy tomorrow. - Check H/H daily. - Obtain CT abdomen pelvis with IV contrast STAT if overt bleeding with hemodynamic instability.  Maylon Peppers, MD Gastroenterology and Hepatology [facility]

## 2020-04-11 NOTE — Anesthesia Preprocedure Evaluation (Addendum)
Anesthesia Evaluation  Patient identified by MRN, date of birth, ID band Patient awake    Reviewed: Allergy & Precautions, NPO status , Patient's Chart, lab work & pertinent test results  History of Anesthesia Complications Negative for: history of anesthetic complications  Airway Mallampati: III  TM Distance: >3 FB Neck ROM: Full    Dental  (+) Poor Dentition, Chipped, Missing, Dental Advisory Given   Pulmonary shortness of breath (on oxygen), pneumonia, resolved, Current Smoker and Patient abstained from smoking.,    Pulmonary exam normal breath sounds clear to auscultation       Cardiovascular Exercise Tolerance: Good hypertension, + Peripheral Vascular Disease  Normal cardiovascular exam Rhythm:Regular Rate:Normal  09-Apr-2020 21:56:30 Broussard System-AP-300 ROUTINE RECORD Normal sinus rhythm Left axis deviation Right bundle branch block Inferior infarct , age undetermined Abnormal ECG Since last tracing RBBB is new Confirmed by Ena Dawley [phone]) on 04/10/2020 12:55:46 PM   Neuro/Psych PSYCHIATRIC DISORDERS Depression Dementia  Neuromuscular disease CVA, Residual Symptoms    GI/Hepatic negative GI ROS, Neg liver ROS,   Endo/Other  negative endocrine ROS  Renal/GU Renal InsufficiencyRenal disease     Musculoskeletal  (+) Arthritis  (spinal stenosis in lumbar region),   Abdominal   Peds  Hematology  (+) anemia ,   Anesthesia Other Findings   Reproductive/Obstetrics                           Anesthesia Physical Anesthesia Plan  ASA: IV  Anesthesia Plan: General   Post-op Pain Management:    Induction: Intravenous  PONV Risk Score and Plan: Propofol infusion  Airway Management Planned: Nasal Cannula and Natural Airway  Additional Equipment:   Intra-op Plan:   Post-operative Plan:   Informed Consent: I have reviewed the patients History and Physical, chart,  labs and discussed the procedure including the risks, benefits and alternatives for the proposed anesthesia with the patient or authorized representative who has indicated his/her understanding and acceptance.    Discussed DNR with patient and Suspend DNR.     Plan Discussed with: CRNA and Surgeon  Anesthesia Plan Comments:       Anesthesia Quick Evaluation

## 2020-04-11 NOTE — Progress Notes (Signed)
Non sustain Vtach on monitor. Patient denies any chestpain. VS rechecked. MD made aware. Continue to monitor per MD.

## 2020-04-11 NOTE — Progress Notes (Signed)
We will proceed with colonoscopy as scheduled.  I thoroughly discussed with the patient's sister his procedure, including the risks involved. Patient understands what the procedure involves including the benefits and any risks. Patient understands alternatives to the proposed procedure. Risks including (but not limited to) bleeding, tearing of the lining (perforation), rupture of adjacent organs, problems with heart and lung function, infection, and medication reactions. A small percentage of complications may require surgery, hospitalization, repeat endoscopic procedure, and/or transfusion.  Patient understood and agreed.  Maylon Peppers, MD Gastroenterology and Hepatology [facility]

## 2020-04-11 NOTE — Anesthesia Postprocedure Evaluation (Signed)
Anesthesia Post Note  Patient: [name] [name]  Procedure(s) Performed: [place]  Patient location during evaluation: Nursing Unit Anesthesia Type: General Level of consciousness: awake and alert and oriented Pain management: pain level controlled Vital Signs Assessment: post-procedure vital signs reviewed and stable Respiratory status: spontaneous breathing and respiratory function stable Cardiovascular status: blood pressure returned to baseline and stable Postop Assessment: no apparent nausea or vomiting Anesthetic complications: no   No complications documented.   Last Vitals:  Vitals:   04/11/20 1004 04/11/20 1015  BP: 126/75 132/77  Pulse: 67 (!) 59  Resp: 15 13  Temp: 36.4 C   SpO2: 100% 95%    Last Pain:  Vitals:   04/11/20 1028  TempSrc:   PainSc: 0-No pain                 Franshesca Chipman C Jazlin Tapscott

## 2020-04-12 ENCOUNTER — Inpatient Hospital Stay (HOSPITAL_COMMUNITY): Payer: Medicare HMO | Admitting: Anesthesiology

## 2020-04-12 ENCOUNTER — Encounter (HOSPITAL_COMMUNITY)
Admission: EM | Disposition: A | Discharge status: Skilled Nursing Facility | Payer: Self-pay | Source: Skilled Nursing Facility | Attending: Internal Medicine

## 2020-04-12 DIAGNOSIS — K625 Hemorrhage of anus and rectum: Secondary | ICD-10-CM | POA: Diagnosis not present

## 2020-04-12 DIAGNOSIS — Z9119 Patient's noncompliance with other medical treatment and regimen: Secondary | ICD-10-CM

## 2020-04-12 HISTORY — PX: FLEXIBLE SIGMOIDOSCOPY: SHX5431

## 2020-04-12 LAB — HEMOGLOBIN AND HEMATOCRIT, BLOOD
HCT: 31.3 % — ABNORMAL LOW (ref 39.0–52.0)
HCT: 32.4 % — ABNORMAL LOW (ref 39.0–52.0)
Hemoglobin: 9.7 g/dL — ABNORMAL LOW (ref 13.0–17.0)
Hemoglobin: 9.8 g/dL — ABNORMAL LOW (ref 13.0–17.0)

## 2020-04-12 SURGERY — SIGMOIDOSCOPY, FLEXIBLE
Anesthesia: General

## 2020-04-12 MED ORDER — PROPOFOL 10 MG/ML IV BOLUS
INTRAVENOUS | Status: DC | PRN
  Administered 2020-04-12 (×2): 50 mg via INTRAVENOUS
  Administered 2020-04-12: 20 mg via INTRAVENOUS

## 2020-04-12 MED ORDER — PEG 3350-KCL-NA BICARB-NACL 420 G PO SOLR
4000.0000 mL | Freq: Once | ORAL | Status: AC
  Administered 2020-04-12: 4000 mL via ORAL

## 2020-04-12 MED ORDER — BISACODYL 5 MG PO TBEC
10.0000 mg | DELAYED_RELEASE_TABLET | Freq: Once | ORAL | Status: AC
  Administered 2020-04-12: 10 mg via ORAL
  Filled 2020-04-12: qty 2

## 2020-04-12 MED ORDER — PHENYLEPHRINE 40 MCG/ML (10ML) SYRINGE FOR IV PUSH (FOR BLOOD PRESSURE SUPPORT)
PREFILLED_SYRINGE | INTRAVENOUS | Status: DC | PRN
  Administered 2020-04-12 (×2): 40 ug via INTRAVENOUS
  Administered 2020-04-12 (×2): 160 ug via INTRAVENOUS
  Administered 2020-04-12: 80 ug via INTRAVENOUS

## 2020-04-12 MED ORDER — STERILE WATER FOR IRRIGATION IR SOLN
Status: DC | PRN
  Administered 2020-04-12: 200 mL

## 2020-04-12 MED ORDER — LIDOCAINE HCL (CARDIAC) PF 100 MG/5ML IV SOSY
PREFILLED_SYRINGE | INTRAVENOUS | Status: DC | PRN
  Administered 2020-04-12: 60 mg via INTRAVENOUS
  Administered 2020-04-12: 40 mg via INTRAVENOUS

## 2020-04-12 MED ORDER — PEG 3350-KCL-NA BICARB-NACL 420 G PO SOLR
ORAL | Status: AC
  Filled 2020-04-12: qty 4000

## 2020-04-12 MED ORDER — LACTATED RINGERS IV SOLN: INTRAVENOUS | Status: DC

## 2020-04-12 MED ORDER — LACTATED RINGERS IV SOLN: INTRAVENOUS | Status: DC | PRN

## 2020-04-12 MED ORDER — PROPOFOL 500 MG/50ML IV EMUL
INTRAVENOUS | Status: DC | PRN
  Administered 2020-04-12: 150 ug/kg/min via INTRAVENOUS

## 2020-04-12 MED ORDER — PROPOFOL 10 MG/ML IV BOLUS
INTRAVENOUS | Status: AC
  Filled 2020-04-12: qty 40

## 2020-04-12 MED ORDER — LIDOCAINE HCL (PF) 2 % IJ SOLN
INTRAMUSCULAR | Status: AC
  Filled 2020-04-12: qty 5

## 2020-04-12 NOTE — Anesthesia Postprocedure Evaluation (Signed)
Anesthesia Post Note  Patient: [name] [name]  Procedure(s) Performed: [place]  Patient location during evaluation: Nursing Unit Anesthesia Type: General Level of consciousness: awake and alert and oriented Pain management: pain level controlled Vital Signs Assessment: post-procedure vital signs reviewed and stable Respiratory status: spontaneous breathing and respiratory function stable Cardiovascular status: blood pressure returned to baseline and stable Postop Assessment: no apparent nausea or vomiting Anesthetic complications: no   No complications documented.   Last Vitals:  Vitals:   04/12/20 1012 04/12/20 1034  BP: 123/77 129/65  Pulse:  (!) 57  Resp: 12 18  Temp:  (!) 36.4 C  SpO2: 95%     Last Pain:  Vitals:   04/12/20 1034  TempSrc: Oral  PainSc:                  Rajamani C Battula

## 2020-04-12 NOTE — Progress Notes (Signed)
We will proceed with colonoscopy as scheduled.  I thoroughly discussed with the patient his procedure, including the risks involved. Patient understands what the procedure involves including the benefits and any risks. Patient understands alternatives to the proposed procedure. Risks including (but not limited to) bleeding, tearing of the lining (perforation), rupture of adjacent organs, problems with heart and lung function, infection, and medication reactions. A small percentage of complications may require surgery, hospitalization, repeat endoscopic procedure, and/or transfusion.  Patient understood and agreed.  Daniel Castaneda, MD Gastroenterology and Hepatology [facility]  

## 2020-04-12 NOTE — Progress Notes (Signed)
PROGRESS NOTE    Draylen Lobue  PNT:[id number] DOB: 07-06-1940 DOA: 04/09/2020 PCP: [name], [name], MD   Brief Narrative:  [name] Bessinger McCauleyis a 80 y.o.malewith medical history significant forhypertension, CVA, depression and dementia, empyema.He was noted to have recent discharge in 03/2020 for acute hypoxemic respiratory failure secondary to right-sided parapneumonic effusion/empyema status post chest tube placement. He has returned to the hospital with 2 days worth of bright red blood per rectum from [facility]. Hemoglobin levels appear stable. GI evaluation with flex sigmoidoscopy performed 3/12 and 3/13 but study was limited.  Plans for repeat colonoscopy on 3/14 per GI.  Assessment & Plan:   Principal Problem:   Rectal bleeding Active Problems:   HTN (hypertension)   Dementia associated with other underlying disease without behavioral disturbance (HCC)   Major depression, recurrent, chronic (HCC)   Proctitis   Acute anemia   Rectal bleed-stable -No significant anemia noted on repeat labs -GI with flex sigmoidoscopy on 3/12 and 3/13, but this was limited.  Plans for repeat colonoscopy on 3/14 -Clear liquid diet -IV PPI twice daily -Repeat H&H in a.m.  Possible aspiration pneumonia -Procalcitonin noted to be low and Unasyn discontinued on 3/12  Hypertension-stable -Not currently on antihypertensives  Depression/dementia -Continue Cymbalta  Dyslipidemia -Continue Crestor   DVT prophylaxis:SCDs Code Status:DNR Family Communication:Discussed with sister on phone 3/12, tried calling 3/13 no response Disposition Plan: Status is: Inpatient  Remains inpatient appropriate because:Ongoing diagnostic testing needed not appropriate for outpatient work up and IV treatments appropriate due to intensity of illness or inability to take PO   Dispo: The patient is from: SNF  Anticipated d/c is to: SNF  Patient  currently is not medically stable to d/c.              Difficult to place patient No  Consultants:  GI  Procedures:  See below  Antimicrobials:  Anti-infectives (From admission, onward)   Start     Dose/Rate Route Frequency Ordered Stop   04/10/20 0000  Ampicillin-Sulbactam (UNASYN) 3 g in sodium chloride 0.9 % 100 mL IVPB  Status:  Discontinued        3 g 200 mL/hr over 30 Minutes Intravenous Every 6 hours 04/09/20 1916 04/11/20 1031   04/09/20 1700  Ampicillin-Sulbactam (UNASYN) 3 g in sodium chloride 0.9 % 100 mL IVPB        3 g 200 mL/hr over 30 Minutes Intravenous  Once 04/09/20 1657 04/09/20 1844       Subjective: Patient seen and evaluated today with no new acute complaints or concerns. No acute concerns or events noted overnight.  Objective: Vitals:   04/12/20 0910 04/12/20 0959 04/12/20 1012 04/12/20 1034  BP: 131/84 117/67 123/77 129/65  Pulse: 67 (!) 58  (!) 57  Resp:  12 12 18   Temp:  (!) 97.1 F (36.2 C)  (!) 97.5 F (36.4 C)  TempSrc:    Oral  SpO2: 99% 95% 95%   Weight:      Height:        Intake/Output Summary (Last 24 hours) at 04/12/2020 1051 Last data filed at 04/12/2020 0954 Gross per 24 hour  Intake 4200 ml  Output 1800 ml  Net 2400 ml   Filed Weights   04/09/20 1241  Weight: 64 kg    Examination:  General exam: Appears calm and comfortable  Respiratory system: Clear to auscultation. Respiratory effort normal. On [place] O2. Cardiovascular system: S1 & S2 heard, RRR.  Gastrointestinal system: Abdomen is soft  Central nervous system: Alert and awake Extremities: No edema Skin: No significant lesions noted Psychiatry: Flat affect.    Data Reviewed: I have personally reviewed following labs and imaging studies  CBC: Recent Labs  Lab 04/09/20 1353 04/09/20 1956 04/09/20 2355 04/10/20 0542 04/10/20 1151 04/11/20 0506 04/12/20 0526  WBC 10.9*  --  11.0* 7.8 8.4 7.3  --   NEUTROABS 8.3*  --   --   --   --   --   --   HGB 11.8*    < > 9.9* 8.6* 10.1* 9.5* 9.7*  HCT 40.0   < > 32.4* 29.2* 33.1* 31.9* 31.3*  MCV 87.0  --  85.9 88.5 86.6 86.2  --   PLT 194  --  164 156 191 178  --    < > = values in this interval not displayed.   Basic Metabolic Panel: Recent Labs  Lab 04/09/20 1353 04/09/20 2355 04/11/20 0506  NA 141  --  139  K 4.8  --  4.0  CL 108  --  106  CO2 25  --  26  GLUCOSE 99  --  82  BUN 32*  --  15  CREATININE 1.03  --  1.05  CALCIUM 8.8*  --  8.4*  MG  --  1.6* 1.8   GFR: Estimated Creatinine Clearance: 50.4 mL/min (by C-G formula based on SCr of 1.05 mg/dL). Liver Function Tests: Recent Labs  Lab 04/09/20 1353  AST 16  ALT 11  ALKPHOS 63  BILITOT 0.7  PROT 6.5  ALBUMIN 3.2*   Recent Labs  Lab 04/09/20 1353  LIPASE 23   No results for input(s): AMMONIA in the last 168 hours. Coagulation Profile: No results for input(s): INR, PROTIME in the last 168 hours. Cardiac Enzymes: No results for input(s): CKTOTAL, CKMB, CKMBINDEX, TROPONINI in the last 168 hours. BNP (last 3 results) No results for input(s): PROBNP in the last 8760 hours. HbA1C: No results for input(s): HGBA1C in the last 72 hours. CBG: No results for input(s): GLUCAP in the last 168 hours. Lipid Profile: No results for input(s): CHOL, HDL, LDLCALC, TRIG, CHOLHDL, LDLDIRECT in the last 72 hours. Thyroid Function Tests: No results for input(s): TSH, T4TOTAL, FREET4, T3FREE, THYROIDAB in the last 72 hours. Anemia Panel: No results for input(s): VITAMINB12, FOLATE, FERRITIN, TIBC, IRON, RETICCTPCT in the last 72 hours. Sepsis Labs: Recent Labs  Lab 04/10/20 1249  PROCALCITON <0.10    Recent Results (from the past 240 hour(s))  Resp Panel by RT-PCR (Flu A&B, Covid) Nasopharyngeal Swab     Status: None   Collection Time: 04/09/20  4:49 PM   Specimen: Nasopharyngeal Swab; Nasopharyngeal(NP) swabs in vial transport medium  Result Value Ref Range Status   SARS Coronavirus 2 by RT PCR NEGATIVE NEGATIVE Final     Comment: (NOTE) SARS-CoV-2 target nucleic acids are NOT DETECTED.  The SARS-CoV-2 RNA is generally detectable in upper respiratory specimens during the acute phase of infection. The lowest concentration of SARS-CoV-2 viral copies this assay can detect is 138 copies/mL. A negative result does not preclude SARS-Cov-2 infection and should not be used as the sole basis for treatment or other patient management decisions. A negative result may occur with  improper specimen collection/handling, submission of specimen other than nasopharyngeal swab, presence of viral mutation(s) within the areas targeted by this assay, and inadequate number of viral copies(<138 copies/mL). A negative result must be combined with clinical observations, patient history, and epidemiological information. The expected result  is Negative.  Fact Sheet for Patients:  EntrepreneurPulse.com.au  Fact Sheet for Healthcare Providers:  IncredibleEmployment.be  This test is no t yet approved or cleared by the [place] FDA and  has been authorized for detection and/or diagnosis of SARS-CoV-2 by FDA under an Emergency Use Authorization (EUA). This EUA will remain  in effect (meaning this test can be used) for the duration of the COVID-19 declaration under Section 564(b)(1) of the Act, 21 U.S.C.section 360bbb-3(b)(1), unless the authorization is terminated  or revoked sooner.       Influenza A by PCR NEGATIVE NEGATIVE Final   Influenza B by PCR NEGATIVE NEGATIVE Final    Comment: (NOTE) The Xpert Xpress SARS-CoV-2/FLU/RSV plus assay is intended as an aid in the diagnosis of influenza from Nasopharyngeal swab specimens and should not be used as a sole basis for treatment. Nasal washings and aspirates are unacceptable for Xpert Xpress SARS-CoV-2/FLU/RSV testing.  Fact Sheet for Patients: EntrepreneurPulse.com.au  Fact Sheet for Healthcare  Providers: IncredibleEmployment.be  This test is not yet approved or cleared by the [place] FDA and has been authorized for detection and/or diagnosis of SARS-CoV-2 by FDA under an Emergency Use Authorization (EUA). This EUA will remain in effect (meaning this test can be used) for the duration of the COVID-19 declaration under Section 564(b)(1) of the Act, 21 U.S.C. section 360bbb-3(b)(1), unless the authorization is terminated or revoked.  Performed at [facility], [address], [place], [place] [address]          Radiology Studies: No results found.      Scheduled Meds: . DULoxetine  60 mg Oral Daily  . pantoprazole (PROTONIX) IV  40 mg Intravenous Q12H  . polyethylene glycol-electrolytes  4,000 mL Oral Once  . rosuvastatin  10 mg Oral Daily   Continuous Infusions: . sodium chloride 500 mL (04/10/20 1259)  . lactated ringers 50 mL/hr at 04/12/20 1043     LOS: 2 days    Time spent: 35 minutes    Saleen Peden Darleen Crocker, DO [facility]  If 7PM-7AM, please contact night-coverage [email or web address] 04/12/2020, 10:51 AM

## 2020-04-12 NOTE — Transfer of Care (Signed)
Immediate Anesthesia Transfer of Care Note  Patient: [name] [name]  Procedure(s) Performed: FLEXIBLE SIGMOIDOSCOPY  Patient Location: PACU  Anesthesia Type:General  Level of Consciousness: sedated  Airway & Oxygen Therapy: Patient Spontanous Breathing and Patient connected to nasal cannula oxygen  Post-op Assessment: Report given to RN and Post -op Vital signs reviewed and stable  Post vital signs: Reviewed and stable  Last Vitals:  Vitals Value Taken Time  BP 117/67 04/11/20 1002  Temp 97.1 1002  Pulse 59 1002  Resp 13 04/12/20 1002  SpO2 95   Vitals shown include unvalidated device data.  Last Pain:  Vitals:   04/12/20 0910  TempSrc:   PainSc: 0-No pain         Complications: No complications documented.

## 2020-04-12 NOTE — Anesthesia Preprocedure Evaluation (Addendum)
Anesthesia Evaluation  Patient identified by MRN, date of birth, ID band Patient awake    Reviewed: Allergy & Precautions, NPO status , Patient's Chart, lab work & pertinent test results  History of Anesthesia Complications Negative for: history of anesthetic complications  Airway Mallampati: III  TM Distance: >3 FB Neck ROM: Full    Dental  (+) Poor Dentition, Chipped, Missing, Dental Advisory Given   Pulmonary shortness of breath (on oxygen), pneumonia, resolved, Current Smoker and Patient abstained from smoking.,    Pulmonary exam normal breath sounds clear to auscultation       Cardiovascular Exercise Tolerance: Good hypertension, + Peripheral Vascular Disease  Normal cardiovascular exam Rhythm:Regular Rate:Normal  09-Apr-2020 21:56:30 [place] System-AP-300 ROUTINE RECORD Normal sinus rhythm Left axis deviation Right bundle branch block Inferior infarct , age undetermined Abnormal ECG Since last tracing RBBB is new Confirmed by Ena Dawley [phone]) on 04/10/2020 12:55:46 PM   Neuro/Psych PSYCHIATRIC DISORDERS Depression Dementia  Neuromuscular disease CVA, Residual Symptoms    GI/Hepatic negative GI ROS, Neg liver ROS,   Endo/Other  negative endocrine ROS  Renal/GU Renal InsufficiencyRenal disease     Musculoskeletal  (+) Arthritis  (spinal stenosis in lumbar region),   Abdominal   Peds  Hematology  (+) anemia ,   Anesthesia Other Findings   Reproductive/Obstetrics                             Anesthesia Physical  Anesthesia Plan  ASA: IV  Anesthesia Plan: General   Post-op Pain Management:    Induction: Intravenous  PONV Risk Score and Plan: Propofol infusion  Airway Management Planned: Nasal Cannula and Natural Airway  Additional Equipment:   Intra-op Plan:   Post-operative Plan:   Informed Consent: I have reviewed the patients History and Physical,  chart, labs and discussed the procedure including the risks, benefits and alternatives for the proposed anesthesia with the patient or authorized representative who has indicated his/her understanding and acceptance.    Discussed DNR with patient and Suspend DNR.   Dental advisory given and Consent reviewed with POA  Plan Discussed with: CRNA and Surgeon  Anesthesia Plan Comments: (Discussed DNR with POA and told her that patient wants to cancel the DNR during the procedure, POA agreed to cancel the DNR during the procedure.)       Anesthesia Quick Evaluation

## 2020-04-12 NOTE — Op Note (Signed)
[facility] Patient Name: [name] [name] Procedure Date: 04/12/2020 8:33 AM MRN: [id number] Date of Birth: 09-08-1940 Attending MD: Maylon Peppers ,  CSN: [id number] Age: 80 Admit Type: Inpatient Procedure:                Flexible Sigmoidoscopy Indications:              Hematochezia Providers:                Maylon Peppers, Lurline Del, RN, Casimer Bilis, Technician Referring MD:              Medicines:                Monitored Anesthesia Care Complications:            No immediate complications. Estimated Blood Loss:     Estimated blood loss: none. Procedure:                Pre-Anesthesia Assessment:                           - Prior to the procedure, a History and Physical                            was performed, and patient medications, allergies                            and sensitivities were reviewed. The patient's                            tolerance of previous anesthesia was reviewed.                           - The risks and benefits of the procedure and the                            sedation options and risks were discussed with the                            patient. All questions were answered and informed                            consent was obtained.                           - ASA Grade Assessment: III - A patient with severe                            systemic disease.                           After obtaining informed consent, the scope was                            passed under direct vision. The PCF-[id number]                            (  [id number]) scope was introduced through the anus and                            advanced to the 50 cm from the anal verge. After                            obtaining informed consent, the scope was passed                            under direct vision.The flexible sigmoidoscopy was                            performed with difficulty due to inadequate bowel                             prep. The quality of the bowel preparation was poor. Scope In: 9:31:37 AM Scope Out: 9:46:31 AM Total Procedure Duration: 0 hours 14 minutes 54 seconds  Findings:      The perianal and digital rectal examinations were normal.      Clotted blood mixed with stool was found in the rectum, in the       recto-sigmoid colon, in the sigmoid colon and in the descending colon. Impression:               - Preparation of the colon was poor.                           - Blood mixed with stool in the rectum, in the                            recto-sigmoid colon, in the sigmoid colon and in                            the descending colon.                           - No specimens collected. Moderate Sedation:      Per Anesthesia Care Recommendation:           - Return patient to hospital ward for ongoing care.                           - Clear liquid diet.                           - Proceed with repeat bowel prep today and possible                            colonoscopy tomorrow.                           - Check H/H daily.                           - Obtain CT abdomen pelvis with IV contrast STAT if  overt bleeding with hemodynamic instability. Procedure Code(s):        --- Professional ---                           [phone], Sigmoidoscopy, flexible; diagnostic,                            including collection of specimen(s) by brushing or                            washing, when performed (separate procedure) Diagnosis Code(s):        --- Professional ---                           K62.5, Hemorrhage of anus and rectum                           K92.2, Gastrointestinal hemorrhage, unspecified                           K92.1, Melena (includes Hematochezia) CPT copyright 2019 American Medical Association. All rights reserved. The codes documented in this report are preliminary and upon coder review may  be revised to meet current compliance requirements. Maylon Peppers,  MD Maylon Peppers,  04/12/2020 9:57:32 AM This report has been signed electronically. Number of Addenda: 0

## 2020-04-12 NOTE — Brief Op Note (Signed)
04/09/2020 - 04/12/2020  9:57 AM  PATIENT:  [name] [name]  80 y.o. male  PRE-OPERATIVE DIAGNOSIS:  rectal bleeding  POST-OPERATIVE DIAGNOSIS:  Lower gastrointestinal bleeding.  PROCEDURE:  Procedure(s): FLEXIBLE SIGMOIDOSCOPY  SURGEON:  Surgeon(s) and Role:    * Harvel Quale, MD - Primary  Patient underwent repeat colonoscopy today.  However as he only drank 2 L of the prep, he was found to have extensive amount of clotted blood and hematochezia in his lumen making it difficult and adequate visualization of the gastrointestinal lining.  No hematin was observed in the expected area up to 50 cm from the anal verge.  RECOMMENDATIONS: - Return patient to hospital ward for ongoing care.  - Clear liquid diet.  - Proceed with repeat bowel prep today and possible colonoscopy tomorrow.  - Check H/H daily.  - Obtain CT abdomen pelvis with IV contrast STAT if overt bleeding with hemodynamic instability.  Maylon Peppers, MD Gastroenterology and Hepatology [facility]

## 2020-04-13 ENCOUNTER — Encounter (HOSPITAL_COMMUNITY)
Admission: EM | Disposition: A | Discharge status: Skilled Nursing Facility | Payer: Self-pay | Source: Skilled Nursing Facility | Attending: Internal Medicine

## 2020-04-13 ENCOUNTER — Inpatient Hospital Stay (HOSPITAL_COMMUNITY): Payer: Medicare HMO | Admitting: Anesthesiology

## 2020-04-13 ENCOUNTER — Encounter (HOSPITAL_COMMUNITY): Payer: Self-pay | Admitting: Gastroenterology

## 2020-04-13 DIAGNOSIS — D490 Neoplasm of unspecified behavior of digestive system: Secondary | ICD-10-CM

## 2020-04-13 DIAGNOSIS — K625 Hemorrhage of anus and rectum: Secondary | ICD-10-CM | POA: Diagnosis not present

## 2020-04-13 DIAGNOSIS — K921 Melena: Secondary | ICD-10-CM

## 2020-04-13 DIAGNOSIS — K922 Gastrointestinal hemorrhage, unspecified: Secondary | ICD-10-CM

## 2020-04-13 HISTORY — PX: COLONOSCOPY WITH PROPOFOL: SHX5780

## 2020-04-13 HISTORY — PX: BIOPSY: SHX5522

## 2020-04-13 LAB — HEMOGLOBIN AND HEMATOCRIT, BLOOD
HCT: 30.3 % — ABNORMAL LOW (ref 39.0–52.0)
Hemoglobin: 9.3 g/dL — ABNORMAL LOW (ref 13.0–17.0)

## 2020-04-13 SURGERY — COLONOSCOPY WITH PROPOFOL
Anesthesia: General

## 2020-04-13 MED ORDER — PROPOFOL 10 MG/ML IV BOLUS
INTRAVENOUS | Status: DC | PRN
  Administered 2020-04-13: 60 mg via INTRAVENOUS

## 2020-04-13 MED ORDER — PROPOFOL 500 MG/50ML IV EMUL
INTRAVENOUS | Status: DC | PRN
  Administered 2020-04-13: 100 ug/kg/min via INTRAVENOUS

## 2020-04-13 NOTE — Anesthesia Postprocedure Evaluation (Signed)
Anesthesia Post Note  Patient: [name] [name]  Procedure(s) Performed: COLONOSCOPY WITH PROPOFOL (N/A ) BIOPSY  Patient location during evaluation: Phase II Anesthesia Type: General Level of consciousness: awake Pain management: pain level controlled Vital Signs Assessment: post-procedure vital signs reviewed and stable Respiratory status: spontaneous breathing, respiratory function stable and patient connected to nasal cannula oxygen (2L O2 pre anesthesia requirement) Cardiovascular status: blood pressure returned to baseline and stable Postop Assessment: no apparent nausea or vomiting Anesthetic complications: no   No complications documented.   Last Vitals:  Vitals:   04/13/20 1320 04/13/20 1423  BP: 139/82 125/74  Pulse: 64 74  Resp: 16 16  Temp: 36.6 C 36.4 C  SpO2: 100% 100%    Last Pain:  Vitals:   04/13/20 1423  TempSrc: Oral  PainSc: 0-No pain                 Karna Dupes

## 2020-04-13 NOTE — Anesthesia Preprocedure Evaluation (Signed)
Anesthesia Evaluation  Patient identified by MRN, date of birth, ID band Patient awake    Reviewed: Allergy & Precautions, NPO status , Patient's Chart, lab work & pertinent test results  History of Anesthesia Complications Negative for: history of anesthetic complications  Airway Mallampati: III  TM Distance: >3 FB Neck ROM: Full    Dental  (+) Poor Dentition, Chipped, Missing, Dental Advisory Given   Pulmonary shortness of breath (on oxygen), pneumonia, resolved, Current Smoker and Patient abstained from smoking.,    Pulmonary exam normal breath sounds clear to auscultation       Cardiovascular Exercise Tolerance: Good hypertension, + Peripheral Vascular Disease  Normal cardiovascular exam Rhythm:Regular Rate:Normal  09-Apr-2020 21:56:30 [place] System-AP-300 ROUTINE RECORD Normal sinus rhythm Left axis deviation Right bundle branch block Inferior infarct , age undetermined Abnormal ECG Since last tracing RBBB is new Confirmed by Ena Dawley [phone]) on 04/10/2020 12:55:46 PM   Neuro/Psych PSYCHIATRIC DISORDERS Depression Dementia  Neuromuscular disease CVA, Residual Symptoms    GI/Hepatic negative GI ROS, Neg liver ROS,   Endo/Other  negative endocrine ROS  Renal/GU Renal InsufficiencyRenal disease     Musculoskeletal  (+) Arthritis  (spinal stenosis in lumbar region),   Abdominal   Peds  Hematology  (+) anemia ,   Anesthesia Other Findings   Reproductive/Obstetrics                             Anesthesia Physical  Anesthesia Plan  ASA: IV  Anesthesia Plan: General   Post-op Pain Management:    Induction: Intravenous  PONV Risk Score and Plan: Propofol infusion  Airway Management Planned: Nasal Cannula and Natural Airway  Additional Equipment:   Intra-op Plan:   Post-operative Plan:   Informed Consent: I have reviewed the patients History and Physical,  chart, labs and discussed the procedure including the risks, benefits and alternatives for the proposed anesthesia with the patient or authorized representative who has indicated his/her understanding and acceptance.    Discussed DNR with patient and Suspend DNR.   Dental advisory given and Consent reviewed with POA  Plan Discussed with: CRNA and Surgeon  Anesthesia Plan Comments: (Discussed DNR with POA and told her that patient wants to cancel the DNR during the procedure, POA agreed to cancel the DNR during the procedure.)        Anesthesia Quick Evaluation

## 2020-04-13 NOTE — Progress Notes (Signed)
SLP Cancellation Note  Patient Details Name: [name] [name] MRN: [id number] DOB: Aug 16, 1940   Cancelled treatment:       Reason Eval/Treat Not Completed: Other (comment) (Pt NPO pending repeat colonscopy today)   Thank you,  Genene Churn, [place]    [place] 04/13/2020, 8:42 AM

## 2020-04-13 NOTE — Progress Notes (Signed)
PROGRESS NOTE    [name] [name]  YKD:[id number] DOB: April 03, 1940 DOA: 04/09/2020 PCP: [name], [name], MD   Brief Narrative:   [name] [name] a 80 y.o.malewith medical history significant forhypertension, CVA, depression and dementia, empyema.He was noted to have recent discharge in 03/2020 for acute hypoxemic respiratory failure secondary to right-sided parapneumonic effusion/empyema status post chest tube placement. He has returned to the hospital with 2 days worth of bright red blood per rectum from [name] [name]. Hemoglobin levels appear stable. GI evaluationwith flex sigmoidoscopy performed 3/12 and 3/13 but study was limited.  Repeat colonoscopy performed on 3/14 per GI with colon mass that will require outpatient follow-up.  Assessment & Plan:   Principal Problem:   Rectal bleeding Active Problems:   HTN (hypertension)   Dementia associated with other underlying disease without behavioral disturbance (HCC)   Major depression, recurrent, chronic (HCC)   Proctitis   Acute anemia   Rectal bleed-stable -No significant anemia noted on repeat labs -GI with flex sigmoidoscopy on 3/12 and 3/13, but this was limited. -Repeat colonoscopy on 3/14 with colon mass noted and biopsy performed.  Continue to monitor H&H since fresh blood noted in colon with clots and diverticulosis. -Advance to heart healthy diet -IV PPI twice daily -Repeat H&H in a.m.  Possible aspiration pneumonia -Procalcitonin noted to be low and Unasyn discontinued on 3/12  Hypertension-stable -Not currently on antihypertensives  Depression/dementia -Continue Cymbalta  Dyslipidemia -Continue Crestor   DVT prophylaxis:SCDs Code Status:DNR Family Communication:Discussed with sister on phone 3/13 Disposition Plan: Status is: Inpatient  Remains inpatient appropriate because:Ongoing diagnostic testing needed not appropriate for outpatient work up and IV treatments  appropriate due to intensity of illness or inability to take PO   Dispo: The patient is from:SNF Anticipated d/c is to:SNF Patient currently is not medically stable to d/c. Difficult to place patient No  Consultants:  GI  Procedures:  See below  Antimicrobials:  Anti-infectives (From admission, onward)   Start     Dose/Rate Route Frequency Ordered Stop   04/10/20 0000  Ampicillin-Sulbactam (UNASYN) 3 g in sodium chloride 0.9 % 100 mL IVPB  Status:  Discontinued        3 g 200 mL/hr over 30 Minutes Intravenous Every 6 hours 04/09/20 1916 04/11/20 1031   04/09/20 1700  Ampicillin-Sulbactam (UNASYN) 3 g in sodium chloride 0.9 % 100 mL IVPB        3 g 200 mL/hr over 30 Minutes Intravenous  Once 04/09/20 1657 04/09/20 1844       Subjective: Patient seen and evaluated today with no new acute complaints or concerns. No acute concerns or events noted overnight.  He had undergone colonoscopy with colon mass noted today.  Objective: Vitals:   04/12/20 2056 04/13/20 0514 04/13/20 1320 04/13/20 1423  BP: (!) 142/94 135/81 139/82 125/74  Pulse: 78 73 64 74  Resp: 18 18 16 16   Temp: (!) 97.4 F (36.3 C) 98.1 F (36.7 C) 97.8 F (36.6 C) 97.6 F (36.4 C)  TempSrc:   Oral Oral  SpO2: 100% 98% 100% 100%  Weight:      Height:        Intake/Output Summary (Last 24 hours) at 04/13/2020 1449 Last data filed at 04/13/2020 1418 Gross per 24 hour  Intake 1773.01 ml  Output 3200 ml  Net -1426.99 ml   Filed Weights   04/09/20 1241  Weight: 64 kg    Examination:  General exam: Appears calm and comfortable  Respiratory system: Clear to  auscultation. Respiratory effort normal.  On nasal cannula oxygen. Cardiovascular system: S1 & S2 heard, RRR.  Gastrointestinal system: Abdomen is soft Central nervous system: Alert and awake Extremities: No edema Skin: No significant lesions noted Psychiatry: Flat affect.    Data Reviewed: I  have personally reviewed following labs and imaging studies  CBC: Recent Labs  Lab 04/09/20 1353 04/09/20 1956 04/09/20 2355 04/10/20 0542 04/10/20 1151 04/11/20 0506 04/12/20 0526 04/12/20 1835 04/13/20 0533  WBC 10.9*  --  11.0* 7.8 8.4 7.3  --   --   --   NEUTROABS 8.3*  --   --   --   --   --   --   --   --   HGB 11.8*   < > 9.9* 8.6* 10.1* 9.5* 9.7* 9.8* 9.3*  HCT 40.0   < > 32.4* 29.2* 33.1* 31.9* 31.3* 32.4* 30.3*  MCV 87.0  --  85.9 88.5 86.6 86.2  --   --   --   PLT 194  --  164 156 191 178  --   --   --    < > = values in this interval not displayed.   Basic Metabolic Panel: Recent Labs  Lab 04/09/20 1353 04/09/20 2355 04/11/20 0506  NA 141  --  139  K 4.8  --  4.0  CL 108  --  106  CO2 25  --  26  GLUCOSE 99  --  82  BUN 32*  --  15  CREATININE 1.03  --  1.05  CALCIUM 8.8*  --  8.4*  MG  --  1.6* 1.8   GFR: Estimated Creatinine Clearance: 50.4 mL/min (by C-G formula based on SCr of 1.05 mg/dL). Liver Function Tests: Recent Labs  Lab 04/09/20 1353  AST 16  ALT 11  ALKPHOS 63  BILITOT 0.7  PROT 6.5  ALBUMIN 3.2*   Recent Labs  Lab 04/09/20 1353  LIPASE 23   No results for input(s): AMMONIA in the last 168 hours. Coagulation Profile: No results for input(s): INR, PROTIME in the last 168 hours. Cardiac Enzymes: No results for input(s): CKTOTAL, CKMB, CKMBINDEX, TROPONINI in the last 168 hours. BNP (last 3 results) No results for input(s): PROBNP in the last 8760 hours. HbA1C: No results for input(s): HGBA1C in the last 72 hours. CBG: No results for input(s): GLUCAP in the last 168 hours. Lipid Profile: No results for input(s): CHOL, HDL, LDLCALC, TRIG, CHOLHDL, LDLDIRECT in the last 72 hours. Thyroid Function Tests: No results for input(s): TSH, T4TOTAL, FREET4, T3FREE, THYROIDAB in the last 72 hours. Anemia Panel: No results for input(s): VITAMINB12, FOLATE, FERRITIN, TIBC, IRON, RETICCTPCT in the last 72 hours. Sepsis Labs: Recent  Labs  Lab 04/10/20 1249  PROCALCITON <0.10    Recent Results (from the past 240 hour(s))  Resp Panel by RT-PCR (Flu A&B, Covid) Nasopharyngeal Swab     Status: None   Collection Time: 04/09/20  4:49 PM   Specimen: Nasopharyngeal Swab; Nasopharyngeal(NP) swabs in vial transport medium  Result Value Ref Range Status   SARS Coronavirus 2 by RT PCR NEGATIVE NEGATIVE Final    Comment: (NOTE) SARS-CoV-2 target nucleic acids are NOT DETECTED.  The SARS-CoV-2 RNA is generally detectable in upper respiratory specimens during the acute phase of infection. The lowest concentration of SARS-CoV-2 viral copies this assay can detect is 138 copies/mL. A negative result does not preclude SARS-Cov-2 infection and should not be used as the sole basis for treatment or other patient  management decisions. A negative result may occur with  improper specimen collection/handling, submission of specimen other than nasopharyngeal swab, presence of viral mutation(s) within the areas targeted by this assay, and inadequate number of viral copies(<138 copies/mL). A negative result must be combined with clinical observations, patient history, and epidemiological information. The expected result is Negative.  Fact Sheet for Patients:  EntrepreneurPulse.com.au  Fact Sheet for Healthcare Providers:  IncredibleEmployment.be  This test is no t yet approved or cleared by the [place] FDA and  has been authorized for detection and/or diagnosis of SARS-CoV-2 by FDA under an Emergency Use Authorization (EUA). This EUA will remain  in effect (meaning this test can be used) for the duration of the COVID-19 declaration under Section 564(b)(1) of the Act, 21 U.S.C.section 360bbb-3(b)(1), unless the authorization is terminated  or revoked sooner.       Influenza A by PCR NEGATIVE NEGATIVE Final   Influenza B by PCR NEGATIVE NEGATIVE Final    Comment: (NOTE) The Xpert Xpress  SARS-CoV-2/FLU/RSV plus assay is intended as an aid in the diagnosis of influenza from Nasopharyngeal swab specimens and should not be used as a sole basis for treatment. Nasal washings and aspirates are unacceptable for Xpert Xpress SARS-CoV-2/FLU/RSV testing.  Fact Sheet for Patients: EntrepreneurPulse.com.au  Fact Sheet for Healthcare Providers: IncredibleEmployment.be  This test is not yet approved or cleared by the [place] FDA and has been authorized for detection and/or diagnosis of SARS-CoV-2 by FDA under an Emergency Use Authorization (EUA). This EUA will remain in effect (meaning this test can be used) for the duration of the COVID-19 declaration under Section 564(b)(1) of the Act, 21 U.S.C. section 360bbb-3(b)(1), unless the authorization is terminated or revoked.  Performed at [name], [address] [place], [place] [address]          Radiology Studies: No results found.      Scheduled Meds: . DULoxetine  60 mg Oral Daily  . pantoprazole (PROTONIX) IV  40 mg Intravenous Q12H  . rosuvastatin  10 mg Oral Daily   Continuous Infusions: . sodium chloride 500 mL (04/10/20 1259)     LOS: 3 days    Time spent: 35 minutes   [name] Donathan Darleen Crocker, DO [facility]  If 7PM-7AM, please contact night-coverage [email or web address] 04/13/2020, 2:49 PM

## 2020-04-13 NOTE — TOC Progression Note (Addendum)
Transition of Care [facility]) - Progression Note    Patient Details  Name: [name] MRN: [id number] Date of Birth: [birth year]  Transition [facility] CM/SW Contact  Salome Arnt, Trail Phone Number: 04/13/2020, 8:28 AM  Clinical Narrative: LCSW spoke with Marianna Fuss at [facility] about pt. Pt is long term. Will attempt PT to see if SNF appropriate. DSS hearing scheduled next week. TOC will follow.       Expected Discharge Plan: [place] Barriers to Discharge: Continued Medical Work up  Expected Discharge Plan and Services Expected Discharge Plan: [place] In-house Referral: NA Discharge Planning Services: NA Post Acute Care Choice: NA Living arrangements for the past 2 months: [place]                 DME Arranged: N/A DME Agency: NA       HH Arranged: NA HH Agency: NA         Social Determinants of Health (SDOH) Interventions    Readmission Risk Interventions No flowsheet data found.

## 2020-04-13 NOTE — Op Note (Signed)
[facility] Patient Name: [name] [name] Procedure Date: 04/13/2020 1:57 PM MRN: [id number] Date of Birth: Jul 21, 1940 Attending MD: Elon Alas. Abbey Chatters DO CSN: [id number] Age: 80 Admit Type: Outpatient Procedure:                Colonoscopy Indications:              Hematochezia Providers:                Elon Alas. Sibley Rolison, DO, [place] B. Sharon Seller, RN,                            Nelma Rothman, Technician Referring MD:              Medicines:                See the Anesthesia note for documentation of the                            administered medications Complications:            No immediate complications. Estimated Blood Loss:     Estimated blood loss was minimal. Procedure:                Pre-Anesthesia Assessment:                           - The anesthesia plan was to use monitored                            anesthesia care (MAC).                           After obtaining informed consent, the colonoscope                            was passed under direct vision. Throughout the                            procedure, the patient's blood pressure, pulse, and                            oxygen saturations were monitored continuously. The                            [id number]-[id number] ([id number]) scope was introduced through                            the anus and advanced to the the cecum, identified                            by appendiceal orifice and ileocecal valve. The                            colonoscopy was performed without difficulty. The                            patient tolerated the procedure well. Scope In: 2:08:16  PM Scope Out: 2:19:07 PM Scope Withdrawal Time: 0 hours 6 minutes 10 seconds  Total Procedure Duration: 0 hours 10 minutes 51 seconds  Findings:      The perianal and digital rectal examinations were normal.      Multiple small-mouthed diverticula were found in the sigmoid colon,       descending colon and transverse colon.      A frond-like/villous, fungating and  polypoid partially obstructing large       mass was found in the ascending colon. The mass was non-circumferential.       Oozing was present. Mucosa was biopsied with a cold forceps for       histology. One specimen bottle was sent to pathology.      Clotted blood was found in the entire colon. Impression:               - Diverticulosis in the sigmoid colon, in the                            descending colon and in the transverse colon.                           - Rule out malignancy, partially obstructing tumor                            in the ascending colon. Biopsied.                           - Blood in the entire examined colon. Moderate Sedation:      Per Anesthesia Care Recommendation:           - Resume regular diet.                           - Continue present medications.                           - Return patient to hospital ward for ongoing care.                           - Continue to monitor Hgb. I will follow up on                            pathology results.                           - Discussed case with surgeon Dr. Constance Haw.                            Recommends outpatient follow up. I have updated                            patient's sister. Procedure Code(s):        --- Professional ---                           [phone], Colonoscopy, flexible; diagnostic, including  collection of specimen(s) by brushing or washing,                            when performed (separate procedure) Diagnosis Code(s):        --- Professional ---                           D49.0, Neoplasm of unspecified behavior of                            digestive system                           K56.690, Other partial intestinal obstruction                           K92.2, Gastrointestinal hemorrhage, unspecified                           K92.1, Melena (includes Hematochezia)                           K57.30, Diverticulosis of large intestine without                             perforation or abscess without bleeding CPT copyright 2019 American Medical Association. All rights reserved. The codes documented in this report are preliminary and upon coder review may  be revised to meet current compliance requirements. Elon Alas. Abbey Chatters, DO [place] Abbey Chatters, DO 04/13/2020 2:37:56 PM This report has been signed electronically. Number of Addenda: 0

## 2020-04-13 NOTE — Transfer of Care (Signed)
Immediate Anesthesia Transfer of Care Note  Patient: [name] [name]  Procedure(s) Performed: COLONOSCOPY WITH PROPOFOL (N/A ) BIOPSY  Patient Location: PACU  Anesthesia Type:General  Level of Consciousness: drowsy  Airway & Oxygen Therapy: Patient Spontanous Breathing and Patient connected to nasal cannula oxygen  Post-op Assessment: Report given to RN and Post -op Vital signs reviewed and stable  Post vital signs: Reviewed and stable  Last Vitals:  Vitals Value Taken Time  BP    Temp    Pulse    Resp    SpO2      Last Pain:  Vitals:   04/13/20 1358  TempSrc:   PainSc: 0-No pain      Patients Stated Pain Goal: 5 (37/54/36 0677)  Complications: No complications documented.

## 2020-04-13 NOTE — NC FL2 (Signed)
[place] LEVEL OF CARE SCREENING TOOL     IDENTIFICATION  Patient Name: [name] [name] Birthdate: 11-21-1940 Sex: male Admission Date (Current Location): 04/09/2020  [facility] and [place] Number:  [employer] and Address:  [place] [address], [place]      Provider Number: [id number]  Attending Physician Name and Address:  Rodena Goldmann, DO  Relative Name and Phone Number:       Current Level of Care: Hospital Recommended Level of Care: [place] Prior Approval Number: [id number]  Date Approved/Denied: 04/10/20 PASRR Number:    Discharge Plan: SNF    Current Diagnoses: Patient Active Problem List   Diagnosis Date Noted  . Acute anemia 04/10/2020  . Proctitis   . Rectal bleeding 04/09/2020  . Chronic constipation   . Hyperlipidemia LDL goal <100   . Major depression, recurrent, chronic ([place])   . Anemia 03/19/2020  . Pressure injury of skin 02/13/2020  . Dementia associated with other underlying disease without behavioral disturbance ([place])   . History of CVA in adulthood   . Physical deconditioning   . Severe sepsis without septic shock ([place])   . Empyema lung ([place]) 01/02/2020  . Lobar pneumonia ([place]) 12/26/2019  . Acute hypoxemic respiratory failure ([place]) 12/26/2019  . Dysphagia 12/26/2019  . Acute encephalopathy 12/26/2019  . CVA (cerebral vascular accident) ([place]) 11/03/2019  . AMS (altered mental status) 11/02/2019  . Acute lower UTI 11/02/2019  . Weakness 09/20/2019  . Protein-calorie malnutrition ([place]) 09/20/2019  . Acute kidney injury ([place]) 09/19/2019  . Ambulatory dysfunction 09/19/2019  . Dehydration 09/19/2019  . Fall at home, initial encounter 09/19/2019  . Aortic atherosclerosis ([place]) 02/16/2019  . HTN (hypertension) 08/13/2018  . Urinary frequency 02/05/2018  . PAD (peripheral artery disease) (HCC)-Mild 12/26/2017  . Chronic pain disorder 11/17/2017  . Unstable  gait 11/17/2017  . BPH associated with nocturia 09/26/2017  . Allergic rhinitis 06/09/2017  . Tobacco use disorder 02/14/2017  . Generalized osteoarthritis of multiple sites 02/14/2017  . Bilateral lower extremity edema 08/12/2016  . Chronic right-sided low back pain with right-sided sciatica 03/14/2016  . Chronic pain of right knee 03/14/2016  . DDD (degenerative disc disease), lumbar 07/07/2014  . DDD (degenerative disc disease), cervical 07/07/2014  . DJD (degenerative joint disease) of knee 07/07/2014  . Bilateral occipital neuralgia 07/07/2014  . Spinal stenosis, lumbar region, with neurogenic claudication 07/07/2014    Orientation RESPIRATION BLADDER Height & Weight     Self,Situation,Place  O2 (2L) Incontinent Weight: 141 lb 1.5 oz (64 kg) Height:  5\' 5"  (165.1 cm)  BEHAVIORAL SYMPTOMS/MOOD NEUROLOGICAL BOWEL NUTRITION STATUS      Incontinent Diet (Heart healthy. See d/c summary for updates.)  AMBULATORY STATUS COMMUNICATION OF NEEDS Skin   Extensive Assist Verbally Other (Comment) (Pressure injury stage 2 sacram)                       Personal Care Assistance Level of Assistance  Bathing,Feeding,Dressing Bathing Assistance: Maximum assistance Feeding assistance: Limited assistance Dressing Assistance: Maximum assistance     Functional Limitations Info  Sight,Hearing,Speech Sight Info: Adequate Hearing Info: Adequate Speech Info: Impaired    SPECIAL CARE FACTORS FREQUENCY                       Contractures Contractures Info: Not present    Additional Factors Info  Code Status,Allergies Code Status Info: DNR Allergies Info: N/A  Current Medications (04/13/2020):  This is the current hospital active medication list Current Facility-Administered Medications  Medication Dose Route Frequency Provider Last Rate Last Admin  . 0.9 %  sodium chloride infusion   Intravenous PRN Heath Lark D, DO 10 mL/hr at 04/10/20 1259 500 mL at 04/10/20  1259  . acetaminophen (TYLENOL) tablet 650 mg  650 mg Oral Q6H PRN Emokpae, Ejiroghene E, MD       Or  . acetaminophen (TYLENOL) suppository 650 mg  650 mg Rectal Q6H PRN Emokpae, Ejiroghene E, MD      . DULoxetine (CYMBALTA) DR capsule 60 mg  60 mg Oral Daily Emokpae, Ejiroghene E, MD   60 mg at 04/12/20 1047  . ondansetron (ZOFRAN) tablet 4 mg  4 mg Oral Q6H PRN Emokpae, Ejiroghene E, MD       Or  . ondansetron (ZOFRAN) injection 4 mg  4 mg Intravenous Q6H PRN Emokpae, Ejiroghene E, MD      . pantoprazole (PROTONIX) injection 40 mg  40 mg Intravenous Q12H Emokpae, Ejiroghene E, MD   40 mg at 04/13/20 0800  . polyethylene glycol (MIRALAX / GLYCOLAX) packet 17 g  17 g Oral Daily PRN Emokpae, Ejiroghene E, MD      . rosuvastatin (CRESTOR) tablet 10 mg  10 mg Oral Daily Emokpae, Ejiroghene E, MD   10 mg at 04/12/20 1047     Discharge Medications: Please see discharge summary for a list of discharge medications.  Relevant Imaging Results:  Relevant Lab Results:   Additional Information    Salome Arnt, LCSW

## 2020-04-14 ENCOUNTER — Encounter (HOSPITAL_COMMUNITY): Payer: Self-pay | Admitting: Internal Medicine

## 2020-04-14 ENCOUNTER — Telehealth: Payer: Self-pay | Admitting: Internal Medicine

## 2020-04-14 ENCOUNTER — Inpatient Hospital Stay
Admission: RE | Admit: 2020-04-14 | Discharge: 2021-01-31 | Disposition: E | Payer: Medicare HMO | Source: Ambulatory Visit | Attending: Internal Medicine | Admitting: Internal Medicine

## 2020-04-14 DIAGNOSIS — K625 Hemorrhage of anus and rectum: Secondary | ICD-10-CM | POA: Diagnosis not present

## 2020-04-14 LAB — HEMOGLOBIN AND HEMATOCRIT, BLOOD
HCT: 30.5 % — ABNORMAL LOW (ref 39.0–52.0)
HCT: 33.4 % — ABNORMAL LOW (ref 39.0–52.0)
Hemoglobin: 9.4 g/dL — ABNORMAL LOW (ref 13.0–17.0)
Hemoglobin: 10 g/dL — ABNORMAL LOW (ref 13.0–17.0)

## 2020-04-14 NOTE — Telephone Encounter (Signed)
error 

## 2020-04-14 NOTE — Discharge Summary (Signed)
Physician Discharge Summary  [name] [name] DOB: February 19, 1940 DOA: 04/09/2020  PCP: [name], [name], MD  Admit date: 04/09/2020  Discharge date: 04/23/2020  Admitted From:SNF  Disposition:  SNF  Recommendations for Outpatient Follow-up:  1. Follow up with PCP in 1-2 weeks 2. Follow-up with general surgery, Dr. Constance Haw as recommended in 1-2 weeks to review further treatment options for colonic mass  3. Continue aspirin for now on hold Plavix until further follow-up with PCP and recheck of hemoglobin hematocrit, monitor for any further bloody bowel movements  Home Health: None  Equipment/Devices: None  Discharge Condition:Stable  CODE STATUS: DNR  Diet recommendation: Heart Healthy  Brief/Interim Summary:  [name] Hinderman McCauleyis a 80 y.o.malewith medical history significant forhypertension, CVA, depression and dementia, empyema.He was noted to have recent discharge in 03/2020 for acute hypoxemic respiratory failure secondary to right-sided parapneumonic effusion/empyema status post chest tube placement. He has returned to the hospital with 2 days worth of bright red blood per rectum from [facility]. Hemoglobin levels appear stable the patient did not require transfusion. GI evaluationwith flex sigmoidoscopy performed 3/12and 3/13but study was limited.  Repeat colonoscopy performed on 3/14 per GI with colon mass that will require outpatient follow-up with general surgery.  He is in stable condition for discharge today.  Discharge Diagnoses:  Principal Problem:   Rectal bleeding Active Problems:   HTN (hypertension)   Dementia associated with other underlying disease without behavioral disturbance (HCC)   Major depression, recurrent, chronic (HCC)   Proctitis   Acute anemia  Principal discharge diagnosis: Rectal bleed in the setting of diverticulosis with newly noted colon mass.  Discharge Instructions  Discharge Instructions    Ambulatory  referral to General Surgery   Complete by: As directed    F/u with Dr. Constance Haw regarding colon mass.   Diet - low sodium heart healthy   Complete by: As directed    Increase activity slowly   Complete by: As directed      Allergies as of 04/05/2020   No Known Allergies     Medication List    STOP taking these medications   clopidogrel 75 MG tablet Commonly known as: PLAVIX   feeding supplement Liqd   multivitamin with minerals Tabs tablet   NON FORMULARY     TAKE these medications   acetaminophen 325 MG tablet Commonly known as: TYLENOL Take 2 tablets (650 mg total) by mouth every 6 (six) hours as needed for mild pain, fever or headache.   Artificial Tears 0.2-0.2-1 % Soln Generic drug: Glycerin-Hypromellose-PEG 400 Place 2 drops into both eyes in the morning and at bedtime.   aspirin EC 81 MG tablet Take 81 mg by mouth daily. Swallow whole.   DULoxetine 60 MG capsule Commonly known as: Cymbalta Take 1 capsule (60 mg total) by mouth daily.   polyethylene glycol 17 g packet Commonly known as: MIRALAX / GLYCOLAX Take 34 g by mouth 2 (two) times daily.   rosuvastatin 10 MG tablet Commonly known as: Crestor Take 1 tablet (10 mg total) by mouth daily.   Venelex Oint Apply 1 application topically as directed. Apply to sacrum, coccyx and bilateral buttocks qshift       Follow-up Information    [name], [name], MD Follow up in 1 week(s).   Specialty: Family Medicine Contact information: [place] [address] [phone]        Virl Cagey, MD Follow up in 2 week(s).   Specialty: General Surgery Contact information:  [address] [facility]na [facility] [address] [facility] Follow up in 2 week(s).   Contact information: [address] [place] [phone]             No Known Allergies  Consultations:  GI   Procedures/Studies: DG Chest 2  View  Result Date: 04/03/2020 CLINICAL DATA:  History of right empyema. EXAM: CHEST - 2 VIEW COMPARISON:  PA and lateral chest 02/05/2020. Single-view of the chest 01/17/2020. CT chest 01/01/2020. FINDINGS: Right worse than left airspace disease in the lung bases seen on the prior examination has markedly improved. Small pleural effusions have resolved. The lungs are emphysematous. No pneumothorax. Heart size is normal. Aortic atherosclerosis. IMPRESSION: Marked improvement and right greater than left basilar airspace disease since the most recent exam. Pleural effusions seen on the most recent exam have resolved. Aortic Atherosclerosis (ICD10-I70.0) and Emphysema (ICD10-J43.9). Electronically Signed   By: Inge Rise M.D.   On: 04/03/2020 15:34   CT ABDOMEN PELVIS W CONTRAST  Result Date: 04/09/2020 CLINICAL DATA:  Bright red blood per rectum today. Diverticulitis suspected. EXAM: CT ABDOMEN AND PELVIS WITH CONTRAST TECHNIQUE: Multidetector CT imaging of the abdomen and pelvis was performed using the standard protocol following bolus administration of intravenous contrast. CONTRAST:  68mL OMNIPAQUE IOHEXOL 300 MG/ML  SOLN COMPARISON:  CT 11/22/2017 FINDINGS: Lower chest: Consolidative opacity in the dependent right lower lobe. Minimal patchy opacity in the dependent left lower lobe. Small right pleural effusion. Heart is normal in size. Hepatobiliary: Stable 13 mm enhancing lesion in the left lobe of the liver, previously characterized as focal nodular hyperplasia. No new liver lesion. Multiple layering gallstones. Difficult to evaluate for wall thickening due to motion through the gallbladder. There is no biliary dilatation. Pancreas: No ductal dilatation or inflammation. Spleen: Normal in size. Tiny low-density lesions in the spleen are similar to prior, typically benign. Adrenals/Urinary Tract: Left greater than right adrenal thickening without dominant adrenal nodule. There is multifocal right renal  scarring and thinning of the renal parenchyma. Minimal scarring in the mid left kidney. Bilateral renal cortical cysts. No evidence of solid lesion. There is symmetric excretion on delayed phase imaging. Urinary bladder is partially distended. Wall thickening is noted at the bladder base. Small basilar bladder diverticulum. Stomach/Bowel: Bowel evaluation is limited in the absence of enteric contrast, paucity of intra-abdominal fat, and patient motion. Decompressed stomach. Equivocal pre pyloric gastric wall thickening. There is no small bowel obstruction, evidence of inflammation or wall thickening. Portions of normal appendix are visualized. No appendicitis. Moderate volume of stool throughout the colon. There is mild wall thickening of the rectum. No other colonic wall thickening. No significant diverticular disease. Examination performed in the late arterial phase, and no accumulation of contrast within the GI tract to localize site of GI bleed. Vascular/Lymphatic: Moderate aortic atherosclerosis. No aneurysm. Patent portal vein. No abdominopelvic adenopathy. Reproductive: Enlarged prostate gland spanning 5.5 cm transverse. Other: Minimal presacral edema. No ascites or free air. Minimal fat in the right greater than left inguinal canal. Musculoskeletal: Large anterior spurs in the thoracic and lumbar spine. Enlarged left transverse process of L5 and pseudoarticulation with the sacrum. Bilateral femoral head avascular necrosis without subchondral collapse. Remote right rib fractures. No acute osseous abnormalities are seen. IMPRESSION: 1. Mild wall thickening of the rectum, may represent proctitis. No other explanation for GI bleed. No significant diverticular disease. 2. Consolidative opacity in the dependent right lower lobe with small right  pleural effusion. Findings may represent pneumonia or aspiration. Minimal patchy left basilar opacity. 3. Equivocal pre pyloric gastric wall thickening, can be seen with  gastritis. 4. Cholelithiasis. Limited assessment for gallbladder inflammation given motion. 5. Bilateral renal scarring, right greater than left. 6. Enlarged prostate gland. Wall thickening at the bladder base may be related to chronic bladder outlet obstruction. Small bladder diverticula. 7. Bilateral femoral head avascular necrosis without subchondral collapse. Aortic Atherosclerosis (ICD10-I70.0). Electronically Signed   By: Keith Rake M.D.   On: 04/09/2020 16:41      Discharge Exam: Vitals:   04/30/2020 0618 03/31/2020 0659  BP: 118/74   Pulse: (!) 41 64  Resp: 16   Temp: (!) 97.1 F (36.2 C)   SpO2: 100%    Vitals:   04/13/20 1505 04/13/20 2049 04/19/2020 0618 04/20/2020 0659  BP: (!) 162/68 119/64 118/74   Pulse: 62 83 (!) 41 64  Resp: 16 16 16    Temp: 97.8 F (36.6 C) (!) 97.5 F (36.4 C) (!) 97.1 F (36.2 C)   TempSrc: Oral     SpO2: 100% 96% 100%   Weight:      Height:        General: Pt is alert, awake, not in acute distress Cardiovascular: RRR, S1/S2 +, no rubs, no gallops Respiratory: CTA bilaterally, no wheezing, no rhonchi Abdominal: Soft, NT, ND, bowel sounds + Extremities: no edema, no cyanosis    The results of significant diagnostics from this hospitalization (including imaging, microbiology, ancillary and laboratory) are listed below for reference.     Microbiology: Recent Results (from the past 240 hour(s))  Resp Panel by RT-PCR (Flu A&B, Covid) Nasopharyngeal Swab     Status: None   Collection Time: 04/09/20  4:49 PM   Specimen: Nasopharyngeal Swab; Nasopharyngeal(NP) swabs in vial transport medium  Result Value Ref Range Status   SARS Coronavirus 2 by RT PCR NEGATIVE NEGATIVE Final    Comment: (NOTE) SARS-CoV-2 target nucleic acids are NOT DETECTED.  The SARS-CoV-2 RNA is generally detectable in upper respiratory specimens during the acute phase of infection. The lowest concentration of SARS-CoV-2 viral copies this assay can detect is 138  copies/mL. A negative result does not preclude SARS-Cov-2 infection and should not be used as the sole basis for treatment or other patient management decisions. A negative result may occur with  improper specimen collection/handling, submission of specimen other than nasopharyngeal swab, presence of viral mutation(s) within the areas targeted by this assay, and inadequate number of viral copies(<138 copies/mL). A negative result must be combined with clinical observations, patient history, and epidemiological information. The expected result is Negative.  Fact Sheet for Patients:  EntrepreneurPulse.com.au  Fact Sheet for Healthcare Providers:  IncredibleEmployment.be  This test is no t yet approved or cleared by the [place] FDA and  has been authorized for detection and/or diagnosis of SARS-CoV-2 by FDA under an Emergency Use Authorization (EUA). This EUA will remain  in effect (meaning this test can be used) for the duration of the COVID-19 declaration under Section 564(b)(1) of the Act, 21 U.S.C.section 360bbb-3(b)(1), unless the authorization is terminated  or revoked sooner.       Influenza A by PCR NEGATIVE NEGATIVE Final   Influenza B by PCR NEGATIVE NEGATIVE Final    Comment: (NOTE) The Xpert Xpress SARS-CoV-2/FLU/RSV plus assay is intended as an aid in the diagnosis of influenza from Nasopharyngeal swab specimens and should not be used as a sole basis for treatment. Nasal washings and aspirates are  unacceptable for Xpert Xpress SARS-CoV-2/FLU/RSV testing.  Fact Sheet for Patients: EntrepreneurPulse.com.au  Fact Sheet for Healthcare Providers: IncredibleEmployment.be  This test is not yet approved or cleared by the [place] FDA and has been authorized for detection and/or diagnosis of SARS-CoV-2 by FDA under an Emergency Use Authorization (EUA). This EUA will remain in effect (meaning  this test can be used) for the duration of the COVID-19 declaration under Section 564(b)(1) of the Act, 21 U.S.C. section 360bbb-3(b)(1), unless the authorization is terminated or revoked.  Performed at [facility], [address], [place], [place] [address]      Labs: BNP (last 3 results) Recent Labs    12/26/19 1510 01/01/20 1018  BNP 88.7 814.4*   Basic Metabolic Panel: Recent Labs  Lab 04/09/20 1353 04/09/20 2355 04/11/20 0506  NA 141  --  139  K 4.8  --  4.0  CL 108  --  106  CO2 25  --  26  GLUCOSE 99  --  82  BUN 32*  --  15  CREATININE 1.03  --  1.05  CALCIUM 8.8*  --  8.4*  MG  --  1.6* 1.8   Liver Function Tests: Recent Labs  Lab 04/09/20 1353  AST 16  ALT 11  ALKPHOS 63  BILITOT 0.7  PROT 6.5  ALBUMIN 3.2*   Recent Labs  Lab 04/09/20 1353  LIPASE 23   No results for input(s): AMMONIA in the last 168 hours. CBC: Recent Labs  Lab 04/09/20 1353 04/09/20 1956 04/09/20 2355 04/10/20 0542 04/10/20 1151 04/11/20 0506 04/12/20 0526 04/12/20 1835 04/13/20 0533 04/27/2020 0522  WBC 10.9*  --  11.0* 7.8 8.4 7.3  --   --   --   --   NEUTROABS 8.3*  --   --   --   --   --   --   --   --   --   HGB 11.8*   < > 9.9* 8.6* 10.1* 9.5* 9.7* 9.8* 9.3* 9.4*  HCT 40.0   < > 32.4* 29.2* 33.1* 31.9* 31.3* 32.4* 30.3* 30.5*  MCV 87.0  --  85.9 88.5 86.6 86.2  --   --   --   --   PLT 194  --  164 156 191 178  --   --   --   --    < > = values in this interval not displayed.   Cardiac Enzymes: No results for input(s): CKTOTAL, CKMB, CKMBINDEX, TROPONINI in the last 168 hours. BNP: Invalid input(s): POCBNP CBG: No results for input(s): GLUCAP in the last 168 hours. D-Dimer No results for input(s): DDIMER in the last 72 hours. Hgb A1c No results for input(s): HGBA1C in the last 72 hours. Lipid Profile No results for input(s): CHOL, HDL, LDLCALC, TRIG, CHOLHDL, LDLDIRECT in the last 72 hours. Thyroid function studies No results for input(s): TSH,  T4TOTAL, T3FREE, THYROIDAB in the last 72 hours.  Invalid input(s): FREET3 Anemia work up No results for input(s): VITAMINB12, FOLATE, FERRITIN, TIBC, IRON, RETICCTPCT in the last 72 hours. Urinalysis    Component Value Date/Time   COLORURINE YELLOW 04/09/2020 1740   APPEARANCEUR CLEAR 04/09/2020 1740   APPEARANCEUR Cloudy 01/02/2012 1326   LABSPEC 1.044 (H) 04/09/2020 1740   LABSPEC 1.017 01/02/2012 1326   PHURINE 5.0 04/09/2020 [place] 04/09/2020 1740   GLUCOSEU NEGATIVE 09/26/2017 [place] 04/09/2020 1740   [place] 04/09/2020 1740   BILIRUBINUR Negative 01/02/2012 1326  KETONESUR NEGATIVE 04/09/2020 1740   PROTEINUR NEGATIVE 04/09/2020 1740   UROBILINOGEN 0.2 09/26/2017 1104   NITRITE NEGATIVE 04/09/2020 1740   LEUKOCYTESUR TRACE (A) 04/09/2020 1740   LEUKOCYTESUR 3+ 01/02/2012 1326   Sepsis Labs Invalid input(s): PROCALCITONIN,  WBC,  LACTICIDVEN Microbiology Recent Results (from the past 240 hour(s))  Resp Panel by RT-PCR (Flu A&B, Covid) Nasopharyngeal Swab     Status: None   Collection Time: 04/09/20  4:49 PM   Specimen: Nasopharyngeal Swab; Nasopharyngeal(NP) swabs in vial transport medium  Result Value Ref Range Status   SARS Coronavirus 2 by RT PCR NEGATIVE NEGATIVE Final    Comment: (NOTE) SARS-CoV-2 target nucleic acids are NOT DETECTED.  The SARS-CoV-2 RNA is generally detectable in upper respiratory specimens during the acute phase of infection. The lowest concentration of SARS-CoV-2 viral copies this assay can detect is 138 copies/mL. A negative result does not preclude SARS-Cov-2 infection and should not be used as the sole basis for treatment or other patient management decisions. A negative result may occur with  improper specimen collection/handling, submission of specimen other than nasopharyngeal swab, presence of viral mutation(s) within the areas targeted by this assay, and inadequate number of  viral copies(<138 copies/mL). A negative result must be combined with clinical observations, patient history, and epidemiological information. The expected result is Negative.  Fact Sheet for Patients:  EntrepreneurPulse.com.au  Fact Sheet for Healthcare Providers:  IncredibleEmployment.be  This test is no t yet approved or cleared by the [place] FDA and  has been authorized for detection and/or diagnosis of SARS-CoV-2 by FDA under an Emergency Use Authorization (EUA). This EUA will remain  in effect (meaning this test can be used) for the duration of the COVID-19 declaration under Section 564(b)(1) of the Act, 21 U.S.C.section 360bbb-3(b)(1), unless the authorization is terminated  or revoked sooner.       Influenza A by PCR NEGATIVE NEGATIVE Final   Influenza B by PCR NEGATIVE NEGATIVE Final    Comment: (NOTE) The Xpert Xpress SARS-CoV-2/FLU/RSV plus assay is intended as an aid in the diagnosis of influenza from Nasopharyngeal swab specimens and should not be used as a sole basis for treatment. Nasal washings and aspirates are unacceptable for Xpert Xpress SARS-CoV-2/FLU/RSV testing.  Fact Sheet for Patients: EntrepreneurPulse.com.au  Fact Sheet for Healthcare Providers: IncredibleEmployment.be  This test is not yet approved or cleared by the [place] FDA and has been authorized for detection and/or diagnosis of SARS-CoV-2 by FDA under an Emergency Use Authorization (EUA). This EUA will remain in effect (meaning this test can be used) for the duration of the COVID-19 declaration under Section 564(b)(1) of the Act, 21 U.S.C. section 360bbb-3(b)(1), unless the authorization is terminated or revoked.  Performed at [facility], [address], [place], [place] [address]      Time coordinating discharge: 35 minutes  SIGNED:   Rodena Goldmann, DO [facility] 04/29/2020, 10:38  AM  If 7PM-7AM, please contact night-coverage [email or web address]

## 2020-04-14 NOTE — Care Management Important Message (Signed)
Important Message  Patient Details  Name: [name] [name] MRN: [id number] Date of Birth: [birth year]   Medicare Important Message Given:  Yes     Tommy Medal 04/19/2020, 11:44 AM

## 2020-04-14 NOTE — Plan of Care (Signed)
  Problem: Acute Rehab PT Goals(only PT should resolve) Goal: Pt Will Go Supine/Side To Sit Outcome: Progressing Flowsheets (Taken 04/20/2020 1232) Pt will go Supine/Side to Sit: with minimal assist Goal: Patient Will Transfer Sit To/From Stand Outcome: Progressing Flowsheets (Taken 04/25/2020 1232) Patient will transfer sit to/from stand: with minimal assist Goal: Pt Will Transfer Bed To Chair/Chair To Bed Outcome: Progressing Flowsheets (Taken 04/16/2020 1232) Pt will Transfer Bed to Chair/Chair to Bed: with min assist Goal: Pt Will Ambulate Outcome: Progressing Flowsheets (Taken 04/18/2020 1232) Pt will Ambulate:  50 feet  with minimal assist  with rolling walker   12:32 PM, 04/24/2020 Lonell Grandchild, MPT Physical Therapist with [facility] [phone] office [phone] mobile phone

## 2020-04-14 NOTE — Telephone Encounter (Signed)
noted 

## 2020-04-14 NOTE — Telephone Encounter (Signed)
[facility] called to make a follow up in 1-2 weeks. I told her that patient had procedure done yesterday and Dr Abbey Chatters is waiting to make recommendations after we get the path report back. I told her that we would be in touch.

## 2020-04-14 NOTE — TOC Transition Note (Signed)
Transition of Care [facility]) - CM/SW Discharge Note   Patient Details  Name: [name] [name] MRN: [id number] Date of Birth: [birth year]  Transition of Care [facility]) CM/SW Contact:  Ihor Gully, LCSW Phone Number: 04/17/2020, 11:41 AM   Clinical Narrative:    Discharge clinicals sent to facility. TOC signing oof. RN to call report.    Final next level of care: Skilled Nursing Facility Barriers to Discharge: Continued Medical Work up   Patient Goals and CMS Choice Patient states their goals for this hospitalization and ongoing recovery are:: Return to [employer].gov Compare Post Acute Care list provided to:: Patient Choice offered to / list presented to : Patient  Discharge Placement                Patient to be transferred to facility by: staff Name of family member notified: sister, Spero Gunnels Patient and family notified of of transfer: 04/30/2020  Discharge Plan and Services In-house Referral: NA Discharge Planning Services: NA Post Acute Care Choice: NA          DME Arranged: N/A DME Agency: NA       HH Arranged: NA HH Agency: NA        Social Determinants of Health (SDOH) Interventions     Readmission Risk Interventions No flowsheet data found.

## 2020-04-14 NOTE — Evaluation (Signed)
Physical Therapy Evaluation Patient Details Name: [name] [name] MRN: [id number] DOB: [birth year] Today's Date: 04/15/2020   History of Present Illness  [name] [name] is a 80 y.o. male with medical history significant for hypertension, CVA, depression and dementia, empyema.  At the time of my evaluation patient is awake and alert, but history is limited due to dementia, he is able to answer a few questions but cannot give detailed history.  History is obtained from chart review.  Most of the history is obtained from chart review.   Patient was brought to the ED from [facility] reports of 2 days of bright red blood per rectum..  Nursing home staff, patient had a large amounts of blood in stool today.  Patient denies vomiting, he denies difficulty breathing.  He tells me he has been coughing for the past 3 days.  He denies difficulty swallowing.    Clinical Impression  Patient demonstrates slow labored movement for sitting up at bedside, very unsteady on feet requiring Mod assist to complete sit to stands, able to ambulate in room with frequent buckling of knees, limited due to fatigue and coughing, on room air with SpO2 at 97-100%, had episode of passing blood during ambulation and tolerated sitting up in chair after therapy - RN/NT notified concerning bleeding from anus.  Patient will benefit from continued physical therapy in hospital and recommended venue below to increase strength, balance, endurance for safe ADLs and gait.     Follow Up Recommendations SNF    Equipment Recommendations  None recommended by PT    Recommendations for Other Services       Precautions / Restrictions Precautions Precautions: Fall Restrictions Weight Bearing Restrictions: No      Mobility  Bed Mobility Overal bed mobility: Needs Assistance Bed Mobility: Supine to Sit     Supine to sit: Mod assist     General bed mobility comments: slow labored movement    Transfers Overall  transfer level: Needs assistance Equipment used: Rolling walker (2 wheeled) Transfers: Sit to/from [facility] Sit to Stand: Mod assist Stand pivot transfers: Mod assist       General transfer comment: very unsteady labored movement with difficulty extending trunk due to weakness  Ambulation/Gait Ambulation/Gait assistance: Mod assist Gait Distance (Feet): 20 Feet Assistive device: Rolling walker (2 wheeled) Gait Pattern/deviations: Decreased step length - right;Decreased step length - left;Decreased stride length;Trunk flexed Gait velocity: decreased   General Gait Details: slow labored unsteady cadence with frequent buckling of knees due to weakness, had episode of passing blood during ambulation and coughing, on room air with SpO2 at 97-100%  Stairs            Wheelchair Mobility    Modified Rankin (Stroke Patients Only)       Balance Overall balance assessment: Needs assistance Sitting-balance support: Feet supported;No upper extremity supported Sitting balance-Leahy Scale: Fair Sitting balance - Comments: seated at EOB with occasional leaning on armrest of chair for support   Standing balance support: During functional activity;Bilateral upper extremity supported Standing balance-Leahy Scale: Poor Standing balance comment: fair/poor using RW                             Pertinent Vitals/Pain Pain Assessment: No/denies pain    Home Living Family/patient expects to be discharged to:: Skilled nursing facility  Prior Function Level of Independence: Needs assistance   Gait / Transfers Assistance Needed: household distances using RW with SNF staff assisting  ADL's / Homemaking Assistance Needed: assisted by SNF staff        Hand Dominance   Dominant Hand: Right    Extremity/Trunk Assessment   Upper Extremity Assessment Upper Extremity Assessment: Generalized weakness    Lower Extremity  Assessment Lower Extremity Assessment: Generalized weakness    Cervical / Trunk Assessment Cervical / Trunk Assessment: Kyphotic  Communication   Communication: HOH;Other (comment)  Cognition Arousal/Alertness: Awake/alert Behavior During Therapy: WFL for tasks assessed/performed Overall Cognitive Status: History of cognitive impairments - at baseline Area of Impairment: Awareness                               General Comments: Patient appears not aware of living in a SNF, thinks he came from his prior residence when asking living situation questions      General Comments      Exercises     Assessment/Plan    PT Assessment Patient needs continued PT services  PT Problem List Decreased strength;Decreased mobility;Decreased activity tolerance;Decreased balance       PT Treatment Interventions DME instruction;Gait training;Stair training;Functional mobility training;Therapeutic activities;Therapeutic exercise;Balance training;Patient/family education    PT Goals (Current goals can be found in the Care Plan section)  Acute Rehab PT Goals Patient Stated Goal: return home Time For Goal Achievement: 04/28/20 Potential to Achieve Goals: Good    Frequency Min 3X/week   Barriers to discharge        Co-evaluation               AM-PAC PT "6 Clicks" Mobility  Outcome Measure Help needed turning from your back to your side while in a flat bed without using bedrails?: A Little Help needed moving from lying on your back to sitting on the side of a flat bed without using bedrails?: A Lot Help needed moving to and from a bed to a chair (including a wheelchair)?: A Lot Help needed standing up from a chair using your arms (e.g., wheelchair or bedside chair)?: A Lot Help needed to walk in hospital room?: A Lot Help needed climbing 3-5 steps with a railing? : A Lot 6 Click Score: 13    End of Session   Activity Tolerance: Patient tolerated treatment well;Patient  limited by fatigue Patient left: in chair;with call bell/phone within reach Nurse Communication: Mobility status PT Visit Diagnosis: Unsteadiness on feet (R26.81);Other abnormalities of gait and mobility (R26.89);Muscle weakness (generalized) (M62.81)    Time: 9326-7124 PT Time Calculation (min) (ACUTE ONLY): 26 min   Charges:   PT Evaluation $PT Eval Moderate Complexity: 1 Mod PT Treatments $Therapeutic Activity: 23-37 mins        12:30 PM, 04/27/2020 Lonell Grandchild, MPT Physical Therapist with [facility] [phone] office [phone] mobile phone

## 2020-04-15 ENCOUNTER — Encounter: Payer: Self-pay | Admitting: Adult Health

## 2020-04-15 ENCOUNTER — Non-Acute Institutional Stay (SKILLED_NURSING_FACILITY): Payer: Medicare HMO | Admitting: Adult Health

## 2020-04-15 ENCOUNTER — Telehealth: Payer: Self-pay | Admitting: Internal Medicine

## 2020-04-15 DIAGNOSIS — K625 Hemorrhage of anus and rectum: Secondary | ICD-10-CM

## 2020-04-15 DIAGNOSIS — K5791 Diverticulosis of intestine, part unspecified, without perforation or abscess with bleeding: Secondary | ICD-10-CM | POA: Diagnosis not present

## 2020-04-15 DIAGNOSIS — C189 Malignant neoplasm of colon, unspecified: Secondary | ICD-10-CM | POA: Diagnosis not present

## 2020-04-15 DIAGNOSIS — R262 Difficulty in walking, not elsewhere classified: Secondary | ICD-10-CM | POA: Diagnosis not present

## 2020-04-15 DIAGNOSIS — M6281 Muscle weakness (generalized): Secondary | ICD-10-CM | POA: Diagnosis not present

## 2020-04-15 DIAGNOSIS — J181 Lobar pneumonia, unspecified organism: Secondary | ICD-10-CM | POA: Diagnosis not present

## 2020-04-15 DIAGNOSIS — J869 Pyothorax without fistula: Secondary | ICD-10-CM

## 2020-04-15 DIAGNOSIS — J9601 Acute respiratory failure with hypoxia: Secondary | ICD-10-CM | POA: Diagnosis not present

## 2020-04-15 DIAGNOSIS — I639 Cerebral infarction, unspecified: Secondary | ICD-10-CM | POA: Diagnosis not present

## 2020-04-15 DIAGNOSIS — R2681 Unsteadiness on feet: Secondary | ICD-10-CM | POA: Diagnosis not present

## 2020-04-15 DIAGNOSIS — R279 Unspecified lack of coordination: Secondary | ICD-10-CM | POA: Diagnosis not present

## 2020-04-15 LAB — SURGICAL PATHOLOGY

## 2020-04-15 NOTE — Telephone Encounter (Signed)
Referral sent yesterday to Dr. Constance Haw Dr. Arnoldo Morale office

## 2020-04-15 NOTE — Telephone Encounter (Signed)
noted 

## 2020-04-15 NOTE — Progress Notes (Signed)
Location:  [facility] Room Number: 104 Place of Service:  SNF (31)   CODE STATUS: dnr  No Known Allergies  Chief Complaint  Patient presents with  . Hospitalization Follow-up    HPI:  He is a 80 year old man who has been hospitalized from 04-09-20 through 04/01/2020. His medication history includes dementia; empyema; depression hyperlipidemia. He had been admitted to this facility for short term rehab. He developed rectal bleeding and presented to the ED for further treatment options. His hgb did remain stable during the hospitalization and did not require transfusion. GI did see him; he had a flex sigmoidoscopy on 3/12 and 3/13 with limited results. He had a repeat colonoscopy on 04-12-20 was found to have a colon mass. He will seen by outpatient surgery for further treatment options. He denies any abdominal pain; no further bleeding present. On cough or shortness of breath. He denies any excessive fatigue. He continues to be followed for his chronic illnesses including: Cerebrovascular accident (CVA) unspecified mechanism:   Dementia associated with other underlying disease Spinal stenosis lumbar region with neurogenic claudication;   Past Medical History:  Diagnosis Date  . Anemia   . Back pain   . Cerebral infarction ([place])   . Dysarthria following cerebral infarction   . Dysphagia following cerebral infarction   . Hyperlipemia   . Major depressive disorder   . Pyothorax ([place])   . Renal disorder    kidney stones  . Spinal stenosis, lumbar region, with neurogenic claudication 07/07/2014  . Unspecified dementia without behavioral disturbance [facility])     Past Surgical History:  Procedure Laterality Date  . BIOPSY  04/13/2020   Procedure: BIOPSY;  Surgeon: Eloise Harman, DO;  [facility];  Service: Endoscopy;;  . CATARACT EXTRACTION W/PHACO Left 08/22/2016   Procedure: CATARACT EXTRACTION PHACO AND INTRAOCULAR LENS PLACEMENT ([place]) Left;  Surgeon:  [name], MD;  Location: [facility];  Service: Ophthalmology;  Laterality: Left;  . CATARACT EXTRACTION W/PHACO Right 10/25/2016   Procedure: CATARACT EXTRACTION PHACO AND INTRAOCULAR LENS PLACEMENT ([place]) WITH ISTENT RIGHT  ;  Surgeon: [name], MD;  Location: [facility];  Service: Ophthalmology;  Laterality: Right;  TOPICAL RIGHT  ISTENT   trabecular bypass stent was implanted/explanted.  . COLONOSCOPY WITH PROPOFOL N/A 04/13/2020   Procedure: COLONOSCOPY WITH PROPOFOL;  Surgeon: Eloise Harman, DO;  [facility];  Service: Endoscopy;  Laterality: N/A;  . FLEXIBLE SIGMOIDOSCOPY  04/11/2020   Procedure: FLEXIBLE SIGMOIDOSCOPY;  Surgeon: Montez Morita, Quillian Quince, MD;  [facility];  Service: Gastroenterology;;  . Otho Darner SIGMOIDOSCOPY  04/12/2020   Procedure: Beryle Quant;  Surgeon: Montez Morita, Quillian Quince, MD;  [facility];  Service: Gastroenterology;;  . [place]    . IR PERC PLEURAL DRAIN W/INDWELL CATH W/IMG GUIDE  01/03/2020  . KNEE SURGERY Right   . NECK SURGERY      Social History   Socioeconomic History  . Marital status: Widowed    Spouse name: Not on file  . Number of children: Not on file  . Years of education: Not on file  . Highest education level: Not on file  Occupational History  . Not on file  Tobacco Use  . Smoking status: Current Every Day Smoker    Packs/day: 0.75    Types: Cigarettes  . Smokeless tobacco: Never Used  Vaping Use  . Vaping Use: Never used  Substance and Sexual Activity  .  Alcohol use: No  . Drug use: Never  . Sexual activity: Not on file  Other Topics Concern  . Not on file  Social History Narrative  . Not on file   Social Determinants of Health   Financial Resource Strain: Not on file  Food Insecurity: Not on file  Transportation Needs: Not on file  Physical Activity: Not on file  Stress: Not on file  Social Connections: Not on file   Intimate Partner Violence: Not on file   Family History  Problem Relation Age of Onset  . Diabetes Mother   . Diabetes Brother   . Diabetes Sister       VITAL SIGNS BP 136/73   Pulse 80   Temp 97.9 F (36.6 C)   Resp 18   Ht 5\' 5"  (1.651 m)   Wt 141 lb (64 kg)   BMI 23.46 kg/m   Outpatient Encounter Medications as of 04/15/2020  Medication Sig  . acetaminophen (TYLENOL) 325 MG tablet Take 2 tablets (650 mg total) by mouth every 6 (six) hours as needed for mild pain, fever or headache.  . ARTIFICIAL TEARS 0.2-0.2-1 % SOLN Place 2 drops into both eyes in the morning and at bedtime.   [name] aspirin EC 81 MG tablet Take 81 mg by mouth daily. Swallow whole.  Roseanne Kaufman [place]-Castor Oil (VENELEX) OINT Apply 1 application topically as directed. Apply to sacrum, coccyx and bilateral buttocks qshift  . DULoxetine (CYMBALTA) 60 MG capsule Take 1 capsule (60 mg total) by mouth daily.  . polyethylene glycol (MIRALAX / GLYCOLAX) 17 g packet Take 34 g by mouth 2 (two) times daily.  . rosuvastatin (CRESTOR) 10 MG tablet Take 1 tablet (10 mg total) by mouth daily.   No facility-administered encounter medications on file as of 04/15/2020.     SIGNIFICANT DIAGNOSTIC EXAMS   PREVIOUS    01-01-20; ct of chest:  1. Moderate-size loculated right pleural effusion, which may reflect an empyema. Most of the right lower lobe is opacified consistent with pneumonia, atelectasis or a combination. 2. Additional mild dependent linear/reticular atelectasis in the right upper and middle lobes and left lower lobe. Trace left pleural effusion. 3. No visualized centrally obstructing mass. 4. Emphysema and mild aortic atherosclerosis. Aortic Atherosclerosis and Emphysema   03-02-20: ct of head;  1. No acute intracranial abnormality. 2. Expected evolution of right corona radiata lacunar infarct from prior exam. Remote lacunar infarct in the right thalamus. 3. Stable atrophy and chronic small vessel  ischemia.  TODAY  04-09-20: ct of abdomen and pelvis:  1. Mild wall thickening of the rectum, may represent proctitis. No other explanation for GI bleed. No significant diverticular disease. 2. Consolidative opacity in the dependent right lower lobe with small right pleural effusion. Findings may represent pneumonia or aspiration. Minimal patchy left basilar opacity. 3. Equivocal pre pyloric gastric wall thickening, can be seen with gastritis. 4. Cholelithiasis. Limited assessment for gallbladder inflammation given motion. 5. Bilateral renal scarring, right greater than left. 6. Enlarged prostate gland. Wall thickening at the bladder base may be related to chronic bladder outlet obstruction. Small bladder diverticula. 7. Bilateral femoral head avascular necrosis without subchondral collapse. Aortic Atherosclerosis   04-13-20: colonoscopy  - Diverticulosis in the sigmoid colon, in the descending colon and in the transverse colon. - Rule out malignancy, partially obstructing tumor in the ascending colon. Biopsied. - Blood in the entire examined colon.   LABS REVIEWED PREVIOUS   01-01-20: wbc 15.9; hgb 11.5; hct 37.3; mcv 88.3  plt 319; glucose 119; bun 19; creat 0.87; k+ 4.2; na++ 136; ca 8.3 GFR>60 01-26-20: wbc 10.4; hgb 10.9; hct 33.6; mcv 81.4 plt 273; glucose 83; bun 18; creat 1.00; k+ 4.6; na++ 136; ca 8.6 GFR>60  02-08-20: wbc 10.4; hgb 12.7; hct 39.9; mcv 81.9 plt 250; glucose 107; bun 20; creat 0.95; k+ 4.6 na++ 136; ca 8.6; GFR>60  TODAY  04-09-20: wbc 10.9; hgb 11.8; hct 40.0; mcv 87.0 plt 194; glucose 99; bun 32; creat 1.03; k+ 4.8; na++ 141; ca 8.8 GFR>60; liver normal albumin 3.2 mag 1.6 04-11-20: wbc 7.3; hgb 9.5; hct 31.9; mcv 86.2 plt 178; glucose 82; bun 15; creat 1.05; k+ 4.0; na++ 139; ca 8.4 GFR>60 mag 1.8 04/05/2020: hgb 10.0; hct 33.4    Review of Systems  Constitutional: Negative for malaise/fatigue.  Respiratory: Negative for cough and shortness of breath.    Cardiovascular: Negative for chest pain, palpitations and leg swelling.  Gastrointestinal: Negative for abdominal pain, constipation and heartburn.  Musculoskeletal: Negative for back pain, joint pain and myalgias.  Skin: Negative.   Neurological: Negative for dizziness.  Psychiatric/Behavioral: The patient is not nervous/anxious.       Physical Exam Constitutional:      General: He is not in acute distress.    Appearance: He is underweight. He is not diaphoretic.  Neck:     Thyroid: No thyromegaly.  Cardiovascular:     Rate and Rhythm: Normal rate and regular rhythm.     Pulses: Normal pulses.     Heart sounds: Normal heart sounds.  Pulmonary:     Effort: Pulmonary effort is normal. No respiratory distress.     Breath sounds: Normal breath sounds.  Abdominal:     General: Bowel sounds are normal. There is no distension.     Palpations: Abdomen is soft.     Tenderness: There is no abdominal tenderness.  Musculoskeletal:     Cervical back: Neck supple.     Right lower leg: No edema.     Left lower leg: No edema.     Comments:  Is able to move all extremities  Right hand contracted      Lymphadenopathy:     Cervical: No cervical adenopathy.  Skin:    General: Skin is warm and dry.  Neurological:     Mental Status: He is alert. Mental status is at baseline.  Psychiatric:        Mood and Affect: Mood normal.       ASSESSMENT/ PLAN:  TODAY  1. Adenocarcinoma of colon/rectal bleeding. Will follow up with Dr. Constance Haw in 1-2 weeks for treatment options.   2. Lobular pneumonia; empyema; acute respiratory failure with hypoxia: has completed abt did receive unison while in the ED. Will repeat cxr  3. Cerebrovascular accident (CVA) unspecified mechanism: is stable will continue asa 81 mg daily and plavix 75 mg daily   4. Dementia associated with other underlying disease without behavioral disturbance is stable weight is 141 pounds; will continue to monitor his status.    5. Spinal stenosis lumbar region with neurogenic claudication; is stable will continue cymbalta 60 mg daily and has prn tylenol  6. Severe protein calorie malnutrition: is stable weight is 141 pounds; albumin 3.2 will continue supplements as directed.   7. Chronic constipation: is stable will continue miralax 34 mg twice daily   8. Hyperlipidemia: LDL goal <100 is stable will continue crestor 10 mg daily   9. Physical deconditioning will continue pt/ot as indicated to improved  upon his independence with hier adls.   10. Major depression recurrent chronic: is stable will continue cymbalta 60 mg daily    11. Primary hypertension: is stable b/p 136/73 will continue to monitor her status.    12. PAD (Peripheral artery disease) is stable will continue plavix 75 mg daily   13. Aortic atherosclerosis: (ct 01-01-20) will monitor     Will check bmp hgb/hct   Time spent with patient 45 minutes: >50% of time spent with coordination of care to include therapy; medications and follow up appointments and labs.     Ok Edwards NP [facility]  Contact [phone] Monday through Friday 8am- 5pm  After hours call [phone]

## 2020-04-15 NOTE — Telephone Encounter (Signed)
Mavis from the [facility] called regarding patient needing a FU in 1-2 weeks. Per Dr Abbey Chatters patient needs urgent referral to a surgeon and he would be contacting the patient's sister. I called Protection back to relay message to [facility] and she is aware.

## 2020-04-16 ENCOUNTER — Non-Acute Institutional Stay (SKILLED_NURSING_FACILITY): Payer: Medicare HMO | Admitting: Internal Medicine

## 2020-04-16 ENCOUNTER — Encounter: Payer: Self-pay | Admitting: Internal Medicine

## 2020-04-16 DIAGNOSIS — R918 Other nonspecific abnormal finding of lung field: Secondary | ICD-10-CM | POA: Diagnosis not present

## 2020-04-16 DIAGNOSIS — J181 Lobar pneumonia, unspecified organism: Secondary | ICD-10-CM | POA: Diagnosis not present

## 2020-04-16 DIAGNOSIS — C189 Malignant neoplasm of colon, unspecified: Secondary | ICD-10-CM

## 2020-04-16 DIAGNOSIS — J9601 Acute respiratory failure with hypoxia: Secondary | ICD-10-CM | POA: Diagnosis not present

## 2020-04-16 DIAGNOSIS — I639 Cerebral infarction, unspecified: Secondary | ICD-10-CM | POA: Diagnosis not present

## 2020-04-16 DIAGNOSIS — K5791 Diverticulosis of intestine, part unspecified, without perforation or abscess with bleeding: Secondary | ICD-10-CM | POA: Diagnosis not present

## 2020-04-16 DIAGNOSIS — D649 Anemia, unspecified: Secondary | ICD-10-CM | POA: Diagnosis not present

## 2020-04-16 DIAGNOSIS — E441 Mild protein-calorie malnutrition: Secondary | ICD-10-CM

## 2020-04-16 DIAGNOSIS — M6281 Muscle weakness (generalized): Secondary | ICD-10-CM | POA: Diagnosis not present

## 2020-04-16 DIAGNOSIS — K625 Hemorrhage of anus and rectum: Secondary | ICD-10-CM | POA: Diagnosis not present

## 2020-04-16 DIAGNOSIS — R262 Difficulty in walking, not elsewhere classified: Secondary | ICD-10-CM | POA: Diagnosis not present

## 2020-04-16 DIAGNOSIS — R2681 Unsteadiness on feet: Secondary | ICD-10-CM | POA: Diagnosis not present

## 2020-04-16 DIAGNOSIS — R279 Unspecified lack of coordination: Secondary | ICD-10-CM | POA: Diagnosis not present

## 2020-04-16 NOTE — Assessment & Plan Note (Addendum)
Incentive spirometry will be attempted;but his neurocognitive deficit may be an issue here. Additionally Speech Therapy will be asked to evaluate aspiration risk. Additional antibiotics will be considered if he is febrile or exhibits hypoxia or other signs of recurrent or progressive pneumonia.

## 2020-04-16 NOTE — Progress Notes (Signed)
NURSING HOME LOCATION:  [facility] ROOM NUMBER:  71 W  CODE STATUS:  DNR  PCP:  Betty [name] MD  This is a comprehensive readmission note to this SNFperformed on this date less than 30 days from date of admission. Included are preadmission medical/surgical history; reconciled medication list; family history; social history and comprehensive review of systems.  Corrections and additions to the records were documented. Comprehensive physical exam was also performed. Additionally a clinical summary was entered for each active diagnosis pertinent to this admission in the Problem List to enhance continuity of care.  HPI: Patient was readmitted 3/10-03/12/2020 from the SNF with bright red rectal bleeding over 48 hours.  Despite this, hemoglobin levels were clinically stable without transfusion requirement.  GI performed flex sigmoidoscopy 3/12 and 3/13 but study was limited.  Colonoscopy was performed 3/14 revealing a colon mass which will require outpatient follow-up with general surgery.Surgical path reveals adenocarcinoma . CT of the abdomen/ pelvis did reveal persistent consolidative changes in the right lower lobe with some effusion.  Differential diagnosis was aspiration versus pneumonia.  His prior films 1/5 and 3/14 showed improvement in the right lower lobe posterior medial infiltrates.  On 3/4 there was suggestion of some consolidative changes in the right lower lobe posterior medial infiltrate.  Portable imaging here at the facility 3/17 revealed faint ill-defined medial right lower lobe infiltrates. He had previously been hospitalized 12/2-2/17/2022 admitted from an SNF with difficulty breathing and confusion.  Chest tube placement was completed 01/03/2020 by IR for clinical empyema. He was transitioned from IV antibiotics to Augmentin for total of 6 weeks.  Chest tube was removed 12/16.  He did exhibit anemia with hemoglobin as low as 10 during the hospitalization as well  as severe protein caloric malnutrition.  He was admitted here post discharge for rehab and ultimate long-term disposition..  Past medical and surgical history: Includes essential hypertension, dyslipidemia, dementia without behavioral disturbance, recurrent/ chronic depression, history of spinal stenosis with neurogenic claudication, history of pyothorax, and history of cerebral infarction associated with dysarthria. Surgical procedures include inguinal hernia repair, right knee surgery, neck surgery, and pleural cavity drainage by IR.  Social history: Nondrinker; smoker prior to admission.  Family history: Strongly positive for diabetes.   Review of systems: Clinical neurocognitive deficits made validity of responses questionable & prevented ROS completion.  When asked why he had been readmitted to the hospital his response was "blood clot, stroke.kept falling".  He had no concept of the rectal bleeding. In reference to the changes on chest x-ray he states that he is "always short of breath".  He describes "occasional phlegm on it" when I asked him if he had a productive cough with sputum or phlegm.  He denies any bleeding dyscrasias or active GI symptoms with special emphasis on dysphagia.  Physical exam:  Pertinent or positive findings: He is thin and appears chronically ill.  Arcus senilis is present.  Dental hygiene is miserable with multiple missing teeth and dental caries and erosions to the gumline or below.  Breath sounds were decreased with no abnormal breath sounds auscultated even in the right lower lobe anteriorly or posteriorly.  He has 1/2+ edema at the sock line.  Pedal pulses are decreased.  Posterior tibial pulses are stronger than the dorsalis pedis pulses.  He has atrophy of the limbs and interosseous wasting.  General appearance: no acute distress, increased work of breathing is present despite abnormal CXRs.   Lymphatic: No lymphadenopathy about the head,  neck, axilla. Eyes:  No conjunctival inflammation or lid edema is present. There is no scleral icterus. Ears:  External ear exam shows no significant lesions or deformities.   Nose:  External nasal examination shows no deformity or inflammation. Nasal mucosa are pink and moist without lesions, exudates Neck:  No thyromegaly, masses, tenderness noted.    Heart:  Normal rate and regular rhythm. S1 and S2 normal without gallop, murmur, click, rub.  Lungs: without wheezes, rhonchi, rales, rubs. Abdomen: Bowel sounds are normal.  Abdomen is soft and nontender with no organomegaly, hernias, masses. GU: Deferred  Extremities:  No cyanosis, clubbing Neurologic exam:Balance, Rhomberg, finger to nose testing could not be completed due to clinical state Skin: Warm & dry w/o tenting. No significant lesions or rash.  See clinical summary under each active problem in the Problem List with associated updated therapeutic plan

## 2020-04-16 NOTE — Assessment & Plan Note (Addendum)
Albumin has improved to 3.2; total protein is low normal at 6.5; but significant muscle wasting present.  Nutritionalist involvement at SNF will continue.

## 2020-04-16 NOTE — Patient Instructions (Addendum)
See assessment and plan under each diagnosis in the problem list and acutely for this visit Total time 49  minutes; greater than 50% of the visit spent pursuing surgical path results unreported in discharge summary and reviewing actual films of multiple inpatient & SNF pulmonary imaging to assess their clinical significance in the context of newly diagnosed colon cancer & other advanced co-morbidities including dementia & protein caloric malnutrition.

## 2020-04-16 NOTE — Assessment & Plan Note (Signed)
No bleeding dyscrasias reported at present; H/H will be monitored.

## 2020-04-16 NOTE — Assessment & Plan Note (Addendum)
Monitor H/H.  General Surgery follow-up once rehab completed at SNF.

## 2020-04-17 DIAGNOSIS — K625 Hemorrhage of anus and rectum: Secondary | ICD-10-CM | POA: Diagnosis not present

## 2020-04-17 DIAGNOSIS — K5791 Diverticulosis of intestine, part unspecified, without perforation or abscess with bleeding: Secondary | ICD-10-CM | POA: Diagnosis not present

## 2020-04-17 DIAGNOSIS — M6281 Muscle weakness (generalized): Secondary | ICD-10-CM | POA: Diagnosis not present

## 2020-04-17 DIAGNOSIS — R262 Difficulty in walking, not elsewhere classified: Secondary | ICD-10-CM | POA: Diagnosis not present

## 2020-04-17 DIAGNOSIS — I639 Cerebral infarction, unspecified: Secondary | ICD-10-CM | POA: Diagnosis not present

## 2020-04-17 DIAGNOSIS — C189 Malignant neoplasm of colon, unspecified: Secondary | ICD-10-CM | POA: Insufficient documentation

## 2020-04-17 DIAGNOSIS — R2681 Unsteadiness on feet: Secondary | ICD-10-CM | POA: Diagnosis not present

## 2020-04-17 DIAGNOSIS — R279 Unspecified lack of coordination: Secondary | ICD-10-CM | POA: Diagnosis not present

## 2020-04-17 DIAGNOSIS — J9601 Acute respiratory failure with hypoxia: Secondary | ICD-10-CM | POA: Diagnosis not present

## 2020-04-17 NOTE — Assessment & Plan Note (Signed)
Dr Abbey Chatters to recommend definitive follow up course. General Surgery referral had been considered post rehab for failure to thrive @ [facility] SNF

## 2020-04-20 ENCOUNTER — Other Ambulatory Visit (HOSPITAL_COMMUNITY)
Admission: RE | Admit: 2020-04-20 | Discharge: 2020-04-20 | Disposition: A | Discharge status: Home / Self Care | Payer: Medicare HMO | Source: Skilled Nursing Facility | Attending: Adult Health | Admitting: Adult Health

## 2020-04-20 DIAGNOSIS — J9601 Acute respiratory failure with hypoxia: Secondary | ICD-10-CM | POA: Diagnosis not present

## 2020-04-20 DIAGNOSIS — K5791 Diverticulosis of intestine, part unspecified, without perforation or abscess with bleeding: Secondary | ICD-10-CM | POA: Diagnosis not present

## 2020-04-20 DIAGNOSIS — R262 Difficulty in walking, not elsewhere classified: Secondary | ICD-10-CM | POA: Diagnosis not present

## 2020-04-20 DIAGNOSIS — D62 Acute posthemorrhagic anemia: Secondary | ICD-10-CM | POA: Insufficient documentation

## 2020-04-20 DIAGNOSIS — R2681 Unsteadiness on feet: Secondary | ICD-10-CM | POA: Diagnosis not present

## 2020-04-20 DIAGNOSIS — I639 Cerebral infarction, unspecified: Secondary | ICD-10-CM | POA: Diagnosis not present

## 2020-04-20 DIAGNOSIS — M6281 Muscle weakness (generalized): Secondary | ICD-10-CM | POA: Diagnosis not present

## 2020-04-20 DIAGNOSIS — R279 Unspecified lack of coordination: Secondary | ICD-10-CM | POA: Diagnosis not present

## 2020-04-20 DIAGNOSIS — K625 Hemorrhage of anus and rectum: Secondary | ICD-10-CM | POA: Diagnosis not present

## 2020-04-20 LAB — BASIC METABOLIC PANEL
Anion gap: 8 (ref 5–15)
BUN: 22 mg/dL (ref 8–23)
CO2: 26 mmol/L (ref 22–32)
Calcium: 8.5 mg/dL — ABNORMAL LOW (ref 8.9–10.3)
Chloride: 105 mmol/L (ref 98–111)
Creatinine, Ser: 1.16 mg/dL (ref 0.61–1.24)
GFR, Estimated: >60 mL/min (ref >60)
Glucose, Bld: 100 mg/dL — ABNORMAL HIGH (ref 70–99)
Potassium: 4 mmol/L (ref 3.5–5.1)
Sodium: 139 mmol/L (ref 135–145)

## 2020-04-20 LAB — HEMOGLOBIN AND HEMATOCRIT, BLOOD
HCT: 32.5 % — ABNORMAL LOW (ref 39.0–52.0)
Hemoglobin: 9.7 g/dL — ABNORMAL LOW (ref 13.0–17.0)

## 2020-04-21 DIAGNOSIS — R279 Unspecified lack of coordination: Secondary | ICD-10-CM | POA: Diagnosis not present

## 2020-04-21 DIAGNOSIS — K625 Hemorrhage of anus and rectum: Secondary | ICD-10-CM | POA: Diagnosis not present

## 2020-04-21 DIAGNOSIS — R2681 Unsteadiness on feet: Secondary | ICD-10-CM | POA: Diagnosis not present

## 2020-04-21 DIAGNOSIS — I639 Cerebral infarction, unspecified: Secondary | ICD-10-CM | POA: Diagnosis not present

## 2020-04-21 DIAGNOSIS — R262 Difficulty in walking, not elsewhere classified: Secondary | ICD-10-CM | POA: Diagnosis not present

## 2020-04-21 DIAGNOSIS — J9601 Acute respiratory failure with hypoxia: Secondary | ICD-10-CM | POA: Diagnosis not present

## 2020-04-21 DIAGNOSIS — M6281 Muscle weakness (generalized): Secondary | ICD-10-CM | POA: Diagnosis not present

## 2020-04-21 DIAGNOSIS — K5791 Diverticulosis of intestine, part unspecified, without perforation or abscess with bleeding: Secondary | ICD-10-CM | POA: Diagnosis not present

## 2020-04-22 DIAGNOSIS — R2681 Unsteadiness on feet: Secondary | ICD-10-CM | POA: Diagnosis not present

## 2020-04-22 DIAGNOSIS — R279 Unspecified lack of coordination: Secondary | ICD-10-CM | POA: Diagnosis not present

## 2020-04-22 DIAGNOSIS — R262 Difficulty in walking, not elsewhere classified: Secondary | ICD-10-CM | POA: Diagnosis not present

## 2020-04-22 DIAGNOSIS — I639 Cerebral infarction, unspecified: Secondary | ICD-10-CM | POA: Diagnosis not present

## 2020-04-22 DIAGNOSIS — K625 Hemorrhage of anus and rectum: Secondary | ICD-10-CM | POA: Diagnosis not present

## 2020-04-22 DIAGNOSIS — K5791 Diverticulosis of intestine, part unspecified, without perforation or abscess with bleeding: Secondary | ICD-10-CM | POA: Diagnosis not present

## 2020-04-22 DIAGNOSIS — M6281 Muscle weakness (generalized): Secondary | ICD-10-CM | POA: Diagnosis not present

## 2020-04-22 DIAGNOSIS — J9601 Acute respiratory failure with hypoxia: Secondary | ICD-10-CM | POA: Diagnosis not present

## 2020-04-23 ENCOUNTER — Other Ambulatory Visit: Payer: Self-pay | Admitting: *Deleted

## 2020-04-23 DIAGNOSIS — I639 Cerebral infarction, unspecified: Secondary | ICD-10-CM | POA: Diagnosis not present

## 2020-04-23 DIAGNOSIS — R262 Difficulty in walking, not elsewhere classified: Secondary | ICD-10-CM | POA: Diagnosis not present

## 2020-04-23 DIAGNOSIS — K5791 Diverticulosis of intestine, part unspecified, without perforation or abscess with bleeding: Secondary | ICD-10-CM | POA: Diagnosis not present

## 2020-04-23 DIAGNOSIS — J9601 Acute respiratory failure with hypoxia: Secondary | ICD-10-CM | POA: Diagnosis not present

## 2020-04-23 DIAGNOSIS — R279 Unspecified lack of coordination: Secondary | ICD-10-CM | POA: Diagnosis not present

## 2020-04-23 DIAGNOSIS — K625 Hemorrhage of anus and rectum: Secondary | ICD-10-CM | POA: Diagnosis not present

## 2020-04-23 DIAGNOSIS — R2681 Unsteadiness on feet: Secondary | ICD-10-CM | POA: Diagnosis not present

## 2020-04-23 DIAGNOSIS — M6281 Muscle weakness (generalized): Secondary | ICD-10-CM | POA: Diagnosis not present

## 2020-04-23 NOTE — Patient Outreach (Signed)
[place] [facility]) Care Management  04/23/2020  [name] [name] 02-Sep-1940 [id number]   Multidisciplinary Case Discussion   Update provided to include:  Medical Conditions Update:  Patient with recent admissions, 10/1-10/7 CVA DC to SNF  [facility] ,  11/25-12/2 Acute Hypoxic respiratory failure, transferred to [facility] 12/2-2/17 , Chest tube placed, CT surgeon on board he was discharged to [facility].  Patient was readmitted on 3/10-03/10/2020 for Rectal bleed, hemoglobin 10, colonoscopy with diverticulosis and a  colon mass for outpatient follow up with general surgeon and GI ,  DC back to [facility] center on 3/15. Outreach call to [facility] on 3/23 spoke with social worker confirms patient still inpatient at center and plan is still for longterm care.     Follow up: Patient case will remain Not active, patient remains at [facility] with continued plan for Long term care.    Joylene Draft, RN, BSN  [place] Management Coordinator  [phone]- Mobile [phone]- Toll Free Main Office

## 2020-04-24 DIAGNOSIS — K5791 Diverticulosis of intestine, part unspecified, without perforation or abscess with bleeding: Secondary | ICD-10-CM | POA: Diagnosis not present

## 2020-04-24 DIAGNOSIS — R262 Difficulty in walking, not elsewhere classified: Secondary | ICD-10-CM | POA: Diagnosis not present

## 2020-04-24 DIAGNOSIS — R2681 Unsteadiness on feet: Secondary | ICD-10-CM | POA: Diagnosis not present

## 2020-04-24 DIAGNOSIS — R279 Unspecified lack of coordination: Secondary | ICD-10-CM | POA: Diagnosis not present

## 2020-04-24 DIAGNOSIS — I639 Cerebral infarction, unspecified: Secondary | ICD-10-CM | POA: Diagnosis not present

## 2020-04-24 DIAGNOSIS — K625 Hemorrhage of anus and rectum: Secondary | ICD-10-CM | POA: Diagnosis not present

## 2020-04-24 DIAGNOSIS — M6281 Muscle weakness (generalized): Secondary | ICD-10-CM | POA: Diagnosis not present

## 2020-04-24 DIAGNOSIS — J9601 Acute respiratory failure with hypoxia: Secondary | ICD-10-CM | POA: Diagnosis not present

## 2020-04-26 DIAGNOSIS — I639 Cerebral infarction, unspecified: Secondary | ICD-10-CM | POA: Diagnosis not present

## 2020-04-26 DIAGNOSIS — K5791 Diverticulosis of intestine, part unspecified, without perforation or abscess with bleeding: Secondary | ICD-10-CM | POA: Diagnosis not present

## 2020-04-26 DIAGNOSIS — M6281 Muscle weakness (generalized): Secondary | ICD-10-CM | POA: Diagnosis not present

## 2020-04-26 DIAGNOSIS — R2681 Unsteadiness on feet: Secondary | ICD-10-CM | POA: Diagnosis not present

## 2020-04-26 DIAGNOSIS — K625 Hemorrhage of anus and rectum: Secondary | ICD-10-CM | POA: Diagnosis not present

## 2020-04-26 DIAGNOSIS — J9601 Acute respiratory failure with hypoxia: Secondary | ICD-10-CM | POA: Diagnosis not present

## 2020-04-26 DIAGNOSIS — R262 Difficulty in walking, not elsewhere classified: Secondary | ICD-10-CM | POA: Diagnosis not present

## 2020-04-26 DIAGNOSIS — R279 Unspecified lack of coordination: Secondary | ICD-10-CM | POA: Diagnosis not present

## 2020-04-27 DIAGNOSIS — K5791 Diverticulosis of intestine, part unspecified, without perforation or abscess with bleeding: Secondary | ICD-10-CM | POA: Diagnosis not present

## 2020-04-27 DIAGNOSIS — I639 Cerebral infarction, unspecified: Secondary | ICD-10-CM | POA: Diagnosis not present

## 2020-04-27 DIAGNOSIS — K625 Hemorrhage of anus and rectum: Secondary | ICD-10-CM | POA: Diagnosis not present

## 2020-04-27 DIAGNOSIS — J9601 Acute respiratory failure with hypoxia: Secondary | ICD-10-CM | POA: Diagnosis not present

## 2020-04-27 DIAGNOSIS — R2681 Unsteadiness on feet: Secondary | ICD-10-CM | POA: Diagnosis not present

## 2020-04-27 DIAGNOSIS — M6281 Muscle weakness (generalized): Secondary | ICD-10-CM | POA: Diagnosis not present

## 2020-04-27 DIAGNOSIS — R262 Difficulty in walking, not elsewhere classified: Secondary | ICD-10-CM | POA: Diagnosis not present

## 2020-04-27 DIAGNOSIS — R279 Unspecified lack of coordination: Secondary | ICD-10-CM | POA: Diagnosis not present

## 2020-04-28 DIAGNOSIS — R262 Difficulty in walking, not elsewhere classified: Secondary | ICD-10-CM | POA: Diagnosis not present

## 2020-04-28 DIAGNOSIS — J9601 Acute respiratory failure with hypoxia: Secondary | ICD-10-CM | POA: Diagnosis not present

## 2020-04-28 DIAGNOSIS — I639 Cerebral infarction, unspecified: Secondary | ICD-10-CM | POA: Diagnosis not present

## 2020-04-28 DIAGNOSIS — K5791 Diverticulosis of intestine, part unspecified, without perforation or abscess with bleeding: Secondary | ICD-10-CM | POA: Diagnosis not present

## 2020-04-28 DIAGNOSIS — R279 Unspecified lack of coordination: Secondary | ICD-10-CM | POA: Diagnosis not present

## 2020-04-28 DIAGNOSIS — M6281 Muscle weakness (generalized): Secondary | ICD-10-CM | POA: Diagnosis not present

## 2020-04-28 DIAGNOSIS — K625 Hemorrhage of anus and rectum: Secondary | ICD-10-CM | POA: Diagnosis not present

## 2020-04-28 DIAGNOSIS — R2681 Unsteadiness on feet: Secondary | ICD-10-CM | POA: Diagnosis not present

## 2020-04-29 DIAGNOSIS — R2681 Unsteadiness on feet: Secondary | ICD-10-CM | POA: Diagnosis not present

## 2020-04-29 DIAGNOSIS — J9601 Acute respiratory failure with hypoxia: Secondary | ICD-10-CM | POA: Diagnosis not present

## 2020-04-29 DIAGNOSIS — R262 Difficulty in walking, not elsewhere classified: Secondary | ICD-10-CM | POA: Diagnosis not present

## 2020-04-29 DIAGNOSIS — K5791 Diverticulosis of intestine, part unspecified, without perforation or abscess with bleeding: Secondary | ICD-10-CM | POA: Diagnosis not present

## 2020-04-29 DIAGNOSIS — I639 Cerebral infarction, unspecified: Secondary | ICD-10-CM | POA: Diagnosis not present

## 2020-04-29 DIAGNOSIS — K625 Hemorrhage of anus and rectum: Secondary | ICD-10-CM | POA: Diagnosis not present

## 2020-04-29 DIAGNOSIS — M6281 Muscle weakness (generalized): Secondary | ICD-10-CM | POA: Diagnosis not present

## 2020-04-29 DIAGNOSIS — R279 Unspecified lack of coordination: Secondary | ICD-10-CM | POA: Diagnosis not present

## 2020-04-30 ENCOUNTER — Telehealth: Payer: Self-pay

## 2020-04-30 ENCOUNTER — Telehealth: Payer: Self-pay | Admitting: Internal Medicine

## 2020-04-30 DIAGNOSIS — J9601 Acute respiratory failure with hypoxia: Secondary | ICD-10-CM | POA: Diagnosis not present

## 2020-04-30 DIAGNOSIS — K5791 Diverticulosis of intestine, part unspecified, without perforation or abscess with bleeding: Secondary | ICD-10-CM | POA: Diagnosis not present

## 2020-04-30 DIAGNOSIS — M6281 Muscle weakness (generalized): Secondary | ICD-10-CM | POA: Diagnosis not present

## 2020-04-30 DIAGNOSIS — R262 Difficulty in walking, not elsewhere classified: Secondary | ICD-10-CM | POA: Diagnosis not present

## 2020-04-30 DIAGNOSIS — R279 Unspecified lack of coordination: Secondary | ICD-10-CM | POA: Diagnosis not present

## 2020-04-30 DIAGNOSIS — K625 Hemorrhage of anus and rectum: Secondary | ICD-10-CM | POA: Diagnosis not present

## 2020-04-30 DIAGNOSIS — I639 Cerebral infarction, unspecified: Secondary | ICD-10-CM | POA: Diagnosis not present

## 2020-04-30 DIAGNOSIS — R2681 Unsteadiness on feet: Secondary | ICD-10-CM | POA: Diagnosis not present

## 2020-04-30 NOTE — Telephone Encounter (Signed)
Spoke with Mavis at the [facility] advised referral was sent to Dr Arnoldo Morale Dr Constance Haw for the pt to be evaluated. Advised that office should be contacting them soon. Was advised to contact that office if they have not heard from them in a week.

## 2020-04-30 NOTE — Telephone Encounter (Signed)
Please call Mavis or Inez Catalina at the [facility] regarding patient. [phone]

## 2020-05-01 DIAGNOSIS — K625 Hemorrhage of anus and rectum: Secondary | ICD-10-CM | POA: Diagnosis not present

## 2020-05-01 DIAGNOSIS — R262 Difficulty in walking, not elsewhere classified: Secondary | ICD-10-CM | POA: Diagnosis not present

## 2020-05-01 DIAGNOSIS — I639 Cerebral infarction, unspecified: Secondary | ICD-10-CM | POA: Diagnosis not present

## 2020-05-01 DIAGNOSIS — R2681 Unsteadiness on feet: Secondary | ICD-10-CM | POA: Diagnosis not present

## 2020-05-01 DIAGNOSIS — J9601 Acute respiratory failure with hypoxia: Secondary | ICD-10-CM | POA: Diagnosis not present

## 2020-05-01 DIAGNOSIS — M6281 Muscle weakness (generalized): Secondary | ICD-10-CM | POA: Diagnosis not present

## 2020-05-01 DIAGNOSIS — R279 Unspecified lack of coordination: Secondary | ICD-10-CM | POA: Diagnosis not present

## 2020-05-01 DIAGNOSIS — K5791 Diverticulosis of intestine, part unspecified, without perforation or abscess with bleeding: Secondary | ICD-10-CM | POA: Diagnosis not present

## 2020-05-01 DEATH — deceased

## 2020-05-04 DIAGNOSIS — R262 Difficulty in walking, not elsewhere classified: Secondary | ICD-10-CM | POA: Diagnosis not present

## 2020-05-04 DIAGNOSIS — K625 Hemorrhage of anus and rectum: Secondary | ICD-10-CM | POA: Diagnosis not present

## 2020-05-04 DIAGNOSIS — M6281 Muscle weakness (generalized): Secondary | ICD-10-CM | POA: Diagnosis not present

## 2020-05-04 DIAGNOSIS — I639 Cerebral infarction, unspecified: Secondary | ICD-10-CM | POA: Diagnosis not present

## 2020-05-04 DIAGNOSIS — R279 Unspecified lack of coordination: Secondary | ICD-10-CM | POA: Diagnosis not present

## 2020-05-04 DIAGNOSIS — R2681 Unsteadiness on feet: Secondary | ICD-10-CM | POA: Diagnosis not present

## 2020-05-04 DIAGNOSIS — K5791 Diverticulosis of intestine, part unspecified, without perforation or abscess with bleeding: Secondary | ICD-10-CM | POA: Diagnosis not present

## 2020-05-04 DIAGNOSIS — J9601 Acute respiratory failure with hypoxia: Secondary | ICD-10-CM | POA: Diagnosis not present

## 2020-05-05 DIAGNOSIS — R279 Unspecified lack of coordination: Secondary | ICD-10-CM | POA: Diagnosis not present

## 2020-05-05 DIAGNOSIS — K5791 Diverticulosis of intestine, part unspecified, without perforation or abscess with bleeding: Secondary | ICD-10-CM | POA: Diagnosis not present

## 2020-05-05 DIAGNOSIS — I639 Cerebral infarction, unspecified: Secondary | ICD-10-CM | POA: Diagnosis not present

## 2020-05-05 DIAGNOSIS — R262 Difficulty in walking, not elsewhere classified: Secondary | ICD-10-CM | POA: Diagnosis not present

## 2020-05-05 DIAGNOSIS — M6281 Muscle weakness (generalized): Secondary | ICD-10-CM | POA: Diagnosis not present

## 2020-05-05 DIAGNOSIS — R2681 Unsteadiness on feet: Secondary | ICD-10-CM | POA: Diagnosis not present

## 2020-05-05 DIAGNOSIS — K625 Hemorrhage of anus and rectum: Secondary | ICD-10-CM | POA: Diagnosis not present

## 2020-05-05 DIAGNOSIS — J9601 Acute respiratory failure with hypoxia: Secondary | ICD-10-CM | POA: Diagnosis not present

## 2020-05-06 DIAGNOSIS — K625 Hemorrhage of anus and rectum: Secondary | ICD-10-CM | POA: Diagnosis not present

## 2020-05-06 DIAGNOSIS — R2681 Unsteadiness on feet: Secondary | ICD-10-CM | POA: Diagnosis not present

## 2020-05-06 DIAGNOSIS — J9601 Acute respiratory failure with hypoxia: Secondary | ICD-10-CM | POA: Diagnosis not present

## 2020-05-06 DIAGNOSIS — I639 Cerebral infarction, unspecified: Secondary | ICD-10-CM | POA: Diagnosis not present

## 2020-05-06 DIAGNOSIS — K5791 Diverticulosis of intestine, part unspecified, without perforation or abscess with bleeding: Secondary | ICD-10-CM | POA: Diagnosis not present

## 2020-05-06 DIAGNOSIS — R279 Unspecified lack of coordination: Secondary | ICD-10-CM | POA: Diagnosis not present

## 2020-05-06 DIAGNOSIS — R262 Difficulty in walking, not elsewhere classified: Secondary | ICD-10-CM | POA: Diagnosis not present

## 2020-05-06 DIAGNOSIS — M6281 Muscle weakness (generalized): Secondary | ICD-10-CM | POA: Diagnosis not present

## 2020-05-07 DIAGNOSIS — I639 Cerebral infarction, unspecified: Secondary | ICD-10-CM | POA: Diagnosis not present

## 2020-05-07 DIAGNOSIS — K625 Hemorrhage of anus and rectum: Secondary | ICD-10-CM | POA: Diagnosis not present

## 2020-05-07 DIAGNOSIS — R262 Difficulty in walking, not elsewhere classified: Secondary | ICD-10-CM | POA: Diagnosis not present

## 2020-05-07 DIAGNOSIS — R279 Unspecified lack of coordination: Secondary | ICD-10-CM | POA: Diagnosis not present

## 2020-05-07 DIAGNOSIS — R2681 Unsteadiness on feet: Secondary | ICD-10-CM | POA: Diagnosis not present

## 2020-05-07 DIAGNOSIS — J9601 Acute respiratory failure with hypoxia: Secondary | ICD-10-CM | POA: Diagnosis not present

## 2020-05-07 DIAGNOSIS — M6281 Muscle weakness (generalized): Secondary | ICD-10-CM | POA: Diagnosis not present

## 2020-05-07 DIAGNOSIS — K5791 Diverticulosis of intestine, part unspecified, without perforation or abscess with bleeding: Secondary | ICD-10-CM | POA: Diagnosis not present

## 2020-05-08 DIAGNOSIS — R279 Unspecified lack of coordination: Secondary | ICD-10-CM | POA: Diagnosis not present

## 2020-05-08 DIAGNOSIS — J9601 Acute respiratory failure with hypoxia: Secondary | ICD-10-CM | POA: Diagnosis not present

## 2020-05-08 DIAGNOSIS — R2681 Unsteadiness on feet: Secondary | ICD-10-CM | POA: Diagnosis not present

## 2020-05-08 DIAGNOSIS — I639 Cerebral infarction, unspecified: Secondary | ICD-10-CM | POA: Diagnosis not present

## 2020-05-08 DIAGNOSIS — M6281 Muscle weakness (generalized): Secondary | ICD-10-CM | POA: Diagnosis not present

## 2020-05-08 DIAGNOSIS — R262 Difficulty in walking, not elsewhere classified: Secondary | ICD-10-CM | POA: Diagnosis not present

## 2020-05-08 DIAGNOSIS — K625 Hemorrhage of anus and rectum: Secondary | ICD-10-CM | POA: Diagnosis not present

## 2020-05-08 DIAGNOSIS — K5791 Diverticulosis of intestine, part unspecified, without perforation or abscess with bleeding: Secondary | ICD-10-CM | POA: Diagnosis not present

## 2020-05-11 DIAGNOSIS — K5791 Diverticulosis of intestine, part unspecified, without perforation or abscess with bleeding: Secondary | ICD-10-CM | POA: Diagnosis not present

## 2020-05-11 DIAGNOSIS — K625 Hemorrhage of anus and rectum: Secondary | ICD-10-CM | POA: Diagnosis not present

## 2020-05-11 DIAGNOSIS — R2681 Unsteadiness on feet: Secondary | ICD-10-CM | POA: Diagnosis not present

## 2020-05-11 DIAGNOSIS — I639 Cerebral infarction, unspecified: Secondary | ICD-10-CM | POA: Diagnosis not present

## 2020-05-11 DIAGNOSIS — J9601 Acute respiratory failure with hypoxia: Secondary | ICD-10-CM | POA: Diagnosis not present

## 2020-05-11 DIAGNOSIS — R279 Unspecified lack of coordination: Secondary | ICD-10-CM | POA: Diagnosis not present

## 2020-05-11 DIAGNOSIS — R262 Difficulty in walking, not elsewhere classified: Secondary | ICD-10-CM | POA: Diagnosis not present

## 2020-05-11 DIAGNOSIS — M6281 Muscle weakness (generalized): Secondary | ICD-10-CM | POA: Diagnosis not present

## 2020-05-12 ENCOUNTER — Telehealth: Payer: Self-pay | Admitting: Internal Medicine

## 2020-05-12 DIAGNOSIS — J9601 Acute respiratory failure with hypoxia: Secondary | ICD-10-CM | POA: Diagnosis not present

## 2020-05-12 DIAGNOSIS — R2681 Unsteadiness on feet: Secondary | ICD-10-CM | POA: Diagnosis not present

## 2020-05-12 DIAGNOSIS — R279 Unspecified lack of coordination: Secondary | ICD-10-CM | POA: Diagnosis not present

## 2020-05-12 DIAGNOSIS — K5791 Diverticulosis of intestine, part unspecified, without perforation or abscess with bleeding: Secondary | ICD-10-CM | POA: Diagnosis not present

## 2020-05-12 DIAGNOSIS — K625 Hemorrhage of anus and rectum: Secondary | ICD-10-CM | POA: Diagnosis not present

## 2020-05-12 DIAGNOSIS — M6281 Muscle weakness (generalized): Secondary | ICD-10-CM | POA: Diagnosis not present

## 2020-05-12 DIAGNOSIS — R262 Difficulty in walking, not elsewhere classified: Secondary | ICD-10-CM | POA: Diagnosis not present

## 2020-05-12 DIAGNOSIS — I639 Cerebral infarction, unspecified: Secondary | ICD-10-CM | POA: Diagnosis not present

## 2020-05-12 NOTE — Telephone Encounter (Signed)
[facility], spoke to [place], informed her referral was sent to Dr. Runell Gess. [name] [name]. Gave her phone# to their office to schedule appt.

## 2020-05-12 NOTE — Telephone Encounter (Signed)
[place] called about patient, asked if we were going to refer him to a surgeon or is he coming back here?

## 2020-05-13 DIAGNOSIS — J9601 Acute respiratory failure with hypoxia: Secondary | ICD-10-CM | POA: Diagnosis not present

## 2020-05-13 DIAGNOSIS — R262 Difficulty in walking, not elsewhere classified: Secondary | ICD-10-CM | POA: Diagnosis not present

## 2020-05-13 DIAGNOSIS — R2681 Unsteadiness on feet: Secondary | ICD-10-CM | POA: Diagnosis not present

## 2020-05-13 DIAGNOSIS — K5791 Diverticulosis of intestine, part unspecified, without perforation or abscess with bleeding: Secondary | ICD-10-CM | POA: Diagnosis not present

## 2020-05-13 DIAGNOSIS — I639 Cerebral infarction, unspecified: Secondary | ICD-10-CM | POA: Diagnosis not present

## 2020-05-13 DIAGNOSIS — M6281 Muscle weakness (generalized): Secondary | ICD-10-CM | POA: Diagnosis not present

## 2020-05-13 DIAGNOSIS — R279 Unspecified lack of coordination: Secondary | ICD-10-CM | POA: Diagnosis not present

## 2020-05-13 DIAGNOSIS — K625 Hemorrhage of anus and rectum: Secondary | ICD-10-CM | POA: Diagnosis not present

## 2020-05-14 ENCOUNTER — Encounter: Payer: Self-pay | Admitting: General Surgery

## 2020-05-14 ENCOUNTER — Ambulatory Visit (INDEPENDENT_AMBULATORY_CARE_PROVIDER_SITE_OTHER): Payer: Medicare HMO | Admitting: General Surgery

## 2020-05-14 DIAGNOSIS — C182 Malignant neoplasm of ascending colon: Secondary | ICD-10-CM | POA: Diagnosis not present

## 2020-05-14 NOTE — Progress Notes (Signed)
[name] [name]; [id number]; 1940-11-27   HPI Patient is a 80 year old black male who was referred to my care by Dr. [name] and Dr. Abbey Chatters for evaluation and treatment of an a sending colon carcinoma.  This was found on colonoscopy for blood per rectum.  History is obtained from previous records as the patient does suffer from dementia.  Patient was seen at the [facility] on 05/13/2020. Past Medical History:  Diagnosis Date  . Anemia   . Back pain   . Cerebral infarction ([place])   . Dysarthria following cerebral infarction   . Hyperlipemia   . Major depressive disorder   . Pyothorax ([place])   . Renal disorder    kidney stones  . Spinal stenosis, lumbar region, with neurogenic claudication 07/07/2014  . Unspecified dementia without behavioral disturbance [facility])     Past Surgical History:  Procedure Laterality Date  . BIOPSY  04/13/2020   Procedure: BIOPSY;  Surgeon: Eloise Harman, DO;  [facility];  Service: Endoscopy;;  . CATARACT EXTRACTION W/PHACO Left 08/22/2016   Procedure: CATARACT EXTRACTION PHACO AND INTRAOCULAR LENS PLACEMENT ([place]) Left;  Surgeon: [name], MD;  Location: [place];  Service: Ophthalmology;  Laterality: Left;  . CATARACT EXTRACTION W/PHACO Right 10/25/2016   Procedure: CATARACT EXTRACTION PHACO AND INTRAOCULAR LENS PLACEMENT ([place]) WITH ISTENT RIGHT  ;  Surgeon: [name], MD;  Location: [place];  Service: Ophthalmology;  Laterality: Right;  TOPICAL RIGHT  ISTENT   trabecular bypass stent was implanted/explanted.  . COLONOSCOPY WITH PROPOFOL N/A 04/13/2020   Procedure: COLONOSCOPY WITH PROPOFOL;  Surgeon: Eloise Harman, DO;  [facility];  Service: Endoscopy;  Laterality: N/A;  . FLEXIBLE SIGMOIDOSCOPY  04/11/2020   Procedure: FLEXIBLE SIGMOIDOSCOPY;  Surgeon: Montez Morita, Quillian Quince, MD;  [facility];  Service: Gastroenterology;;  . Otho Darner SIGMOIDOSCOPY  04/12/2020    Procedure: Beryle Quant;  Surgeon: Montez Morita, Quillian Quince, MD;  [facility];  Service: Gastroenterology;;  . [place]    . IR PERC PLEURAL DRAIN W/INDWELL CATH W/IMG GUIDE  01/03/2020  . KNEE SURGERY Right   . NECK SURGERY      Family History  Problem Relation Age of Onset  . Diabetes Mother   . Diabetes Brother   . Diabetes Sister     Current Outpatient Medications on File Prior to Visit  Medication Sig Dispense Refill  . acetaminophen (TYLENOL) 325 MG tablet Take 2 tablets (650 mg total) by mouth every 6 (six) hours as needed for mild pain, fever or headache.    . ARTIFICIAL TEARS 0.2-0.2-1 % SOLN Place 2 drops into both eyes in the morning and at bedtime.     [name] aspirin EC 81 MG tablet Take 81 mg by mouth daily. Swallow whole.    Roseanne Kaufman Peru-Castor Oil (VENELEX) OINT Apply 1 application topically as directed. Apply to sacrum, coccyx and bilateral buttocks qshift    . DULoxetine (CYMBALTA) 60 MG capsule Take 1 capsule (60 mg total) by mouth daily. 90 capsule 2  . polyethylene glycol (MIRALAX / GLYCOLAX) 17 g packet Take 34 g by mouth 2 (two) times daily.  0  . rosuvastatin (CRESTOR) 10 MG tablet Take 1 tablet (10 mg total) by mouth daily. 90 tablet 1   No current facility-administered medications on file prior to visit.    No Known Allergies  Social History   Substance and Sexual Activity  Alcohol Use No    Social  History   Tobacco Use  Smoking Status Current Every Day Smoker  . Packs/day: 0.75  . Types: Cigarettes  Smokeless Tobacco Never Used    Review of Systems  Unable to perform ROS: Dementia    Objective   There were no vitals filed for this visit.  Physical Exam Vitals reviewed.  Constitutional:      Appearance: Normal appearance. He is not ill-appearing.  HENT:     Head: Normocephalic and atraumatic.  Cardiovascular:     Rate and Rhythm: Normal rate and regular rhythm.     Heart sounds: Normal heart sounds.  No murmur heard. No friction rub. No gallop.   Pulmonary:     Effort: Pulmonary effort is normal. No respiratory distress.     Breath sounds: Normal breath sounds. No stridor. No wheezing, rhonchi or rales.  Abdominal:     General: Bowel sounds are normal. There is no distension.     Palpations: Abdomen is soft. There is no mass.     Tenderness: There is no abdominal tenderness. There is no guarding or rebound.     Hernia: No hernia is present.  Skin:    General: Skin is warm and dry.    Colonoscopy, pathology report, previous admissions reviewed Assessment  Newly diagnosed ascending colon carcinoma, dementia, recent treatment for empyema on top of COPD, history of CVA. Overall, patient appears to be at high risk for surgical intervention due to the need for intubation during surgery and the difficulty he may have coming off the ventilator due to his COPD, dimension, and CVA.  I do not know his exact cardiac status at this time. Plan   The situation was discussed with Ok Edwards, NP.  She will contact the family as to how aggressive they would like to be.  Further risk assessment could be made with cardiology.  Further management is pending further input.  Please let me know.

## 2020-05-18 ENCOUNTER — Encounter: Payer: Self-pay | Admitting: Adult Health

## 2020-05-18 ENCOUNTER — Encounter (HOSPITAL_COMMUNITY)
Admission: AD | Admit: 2020-05-18 | Discharge: 2020-05-18 | Disposition: A | Discharge status: Home / Self Care | Payer: Medicare HMO | Source: Skilled Nursing Facility | Attending: Adult Health | Admitting: Adult Health

## 2020-05-18 ENCOUNTER — Non-Acute Institutional Stay (SKILLED_NURSING_FACILITY): Payer: Medicare HMO | Admitting: Adult Health

## 2020-05-18 DIAGNOSIS — F0280 Dementia in other diseases classified elsewhere without behavioral disturbance: Secondary | ICD-10-CM | POA: Diagnosis not present

## 2020-05-18 DIAGNOSIS — C189 Malignant neoplasm of colon, unspecified: Secondary | ICD-10-CM

## 2020-05-18 DIAGNOSIS — I639 Cerebral infarction, unspecified: Secondary | ICD-10-CM

## 2020-05-18 DIAGNOSIS — R1909 Other intra-abdominal and pelvic swelling, mass and lump: Secondary | ICD-10-CM | POA: Insufficient documentation

## 2020-05-18 NOTE — Progress Notes (Signed)
Location:  [place] Room Number: 104/W Place of Service:  SNF (31)   CODE STATUS: DNR  No Known Allergies  Chief Complaint  Patient presents with  . Medical Management of Chronic Issues             Adenocarcinoma of colon:     Cerebrovascular accident (CVA) unspecified mechanism:   Dementia associated with other underlying disease without behavioral disturbance:     HPI:  He is a 80 year old long term resident of this facility being seen for the management of his chronic illnesses:  Adenocarcinoma of colon:     Cerebrovascular accident (CVA) unspecified mechanism:   Dementia associated with other underlying disease without behavioral disturbance. There are no reports of agitation; no anxiety. He has had a surgical consult done. He is a very poor surgical consult; his guardian has been informed and is in agreement with his plan of care; awaiting cardiology clearance.    Past Medical History:  Diagnosis Date  . Anemia   . Back pain   . Cerebral infarction ([place])   . Dysarthria following cerebral infarction   . Hyperlipemia   . Major depressive disorder   . Pyothorax ([place])   . Renal disorder    kidney stones  . Spinal stenosis, lumbar region, with neurogenic claudication 07/07/2014  . Unspecified dementia without behavioral disturbance [facility])     Past Surgical History:  Procedure Laterality Date  . BIOPSY  04/13/2020   Procedure: BIOPSY;  Surgeon: Eloise Harman, DO;  [facility];  Service: Endoscopy;;  . CATARACT EXTRACTION W/PHACO Left 08/22/2016   Procedure: CATARACT EXTRACTION PHACO AND INTRAOCULAR LENS PLACEMENT ([place]) Left;  Surgeon: [name], MD;  Location: [place];  Service: Ophthalmology;  Laterality: Left;  . CATARACT EXTRACTION W/PHACO Right 10/25/2016   Procedure: CATARACT EXTRACTION PHACO AND INTRAOCULAR LENS PLACEMENT ([place]) WITH ISTENT RIGHT  ;  Surgeon: [name], MD;  Location: [place];   Service: Ophthalmology;  Laterality: Right;  TOPICAL RIGHT  ISTENT   trabecular bypass stent was implanted/explanted.  . COLONOSCOPY WITH PROPOFOL N/A 04/13/2020   Procedure: COLONOSCOPY WITH PROPOFOL;  Surgeon: Eloise Harman, DO;  [facility];  Service: Endoscopy;  Laterality: N/A;  . FLEXIBLE SIGMOIDOSCOPY  04/11/2020   Procedure: FLEXIBLE SIGMOIDOSCOPY;  Surgeon: Montez Morita, Quillian Quince, MD;  [facility];  Service: Gastroenterology;;  . Otho Darner SIGMOIDOSCOPY  04/12/2020   Procedure: Beryle Quant;  Surgeon: Montez Morita, Quillian Quince, MD;  [facility];  Service: Gastroenterology;;  . [place]    . IR PERC PLEURAL DRAIN W/INDWELL CATH W/IMG GUIDE  01/03/2020  . KNEE SURGERY Right   . NECK SURGERY      Social History   Socioeconomic History  . Marital status: Widowed    Spouse name: Not on file  . Number of children: Not on file  . Years of education: Not on file  . Highest education level: Not on file  Occupational History  . Not on file  Tobacco Use  . Smoking status: Current Every Day Smoker    Packs/day: 0.75    Types: Cigarettes  . Smokeless tobacco: Never Used  Vaping Use  . Vaping Use: Never used  Substance and Sexual Activity  . Alcohol use: No  . Drug use: Never  . Sexual activity: Not on file  Other Topics Concern  . Not on file  Social History Narrative  . Not on  file   Social Determinants of Health   Financial Resource Strain: Not on file  Food Insecurity: Not on file  Transportation Needs: Not on file  Physical Activity: Not on file  Stress: Not on file  Social Connections: Not on file  Intimate Partner Violence: Not on file   Family History  Problem Relation Age of Onset  . Diabetes Mother   . Diabetes Brother   . Diabetes Sister       VITAL SIGNS BP 134/81   Pulse 66   Temp 97.7 F (36.5 C)   Ht 5\' 5"  (1.651 m)   Wt 138 lb 3.2 oz (62.7 kg)   BMI 23.00 kg/m   Outpatient  Encounter Medications as of 05/18/2020  Medication Sig  . acetaminophen (TYLENOL) 325 MG tablet Take 2 tablets (650 mg total) by mouth every 6 (six) hours as needed for mild pain, fever or headache.  . ARTIFICIAL TEARS 0.2-0.2-1 % SOLN Place 2 drops into both eyes in the morning and at bedtime.   [name] aspirin EC 81 MG tablet Take 81 mg by mouth daily. Swallow whole.  Roseanne Kaufman Peru-Castor Oil (VENELEX) OINT Apply 1 application topically as directed. Apply to sacrum, coccyx and bilateral buttocks qshift  . DULoxetine (CYMBALTA) 60 MG capsule Take 1 capsule (60 mg total) by mouth daily.  . Multiple Vitamins-Minerals (SENIOR MULTIVITAMIN PLUS PO) Take 1 tablet by mouth daily in the afternoon.  . NON FORMULARY Dist NAS  . polyethylene glycol (MIRALAX / GLYCOLAX) 17 g packet Take 34 g by mouth 2 (two) times daily.  . rosuvastatin (CRESTOR) 10 MG tablet Take 1 tablet (10 mg total) by mouth daily.   No facility-administered encounter medications on file as of 05/18/2020.     SIGNIFICANT DIAGNOSTIC EXAMS  PREVIOUS    01-01-20; ct of chest:  1. Moderate-size loculated right pleural effusion, which may reflect an empyema. Most of the right lower lobe is opacified consistent with pneumonia, atelectasis or a combination. 2. Additional mild dependent linear/reticular atelectasis in the right upper and middle lobes and left lower lobe. Trace left pleural effusion. 3. No visualized centrally obstructing mass. 4. Emphysema and mild aortic atherosclerosis. Aortic Atherosclerosis and Emphysema   03-02-20: ct of head;  1. No acute intracranial abnormality. 2. Expected evolution of right corona radiata lacunar infarct from prior exam. Remote lacunar infarct in the right thalamus. 3. Stable atrophy and chronic small vessel ischemia.  04-09-20: ct of abdomen and pelvis:  1. Mild wall thickening of the rectum, may represent proctitis. No other explanation for GI bleed. No significant diverticular disease. 2.  Consolidative opacity in the dependent right lower lobe with small right pleural effusion. Findings may represent pneumonia or aspiration. Minimal patchy left basilar opacity. 3. Equivocal pre pyloric gastric wall thickening, can be seen with gastritis. 4. Cholelithiasis. Limited assessment for gallbladder inflammation given motion. 5. Bilateral renal scarring, right greater than left. 6. Enlarged prostate gland. Wall thickening at the bladder base may be related to chronic bladder outlet obstruction. Small bladder diverticula. 7. Bilateral femoral head avascular necrosis without subchondral collapse. Aortic Atherosclerosis   04-13-20: colonoscopy  - Diverticulosis in the sigmoid colon, in the descending colon and in the transverse colon. - Rule out malignancy, partially obstructing tumor in the ascending colon. Biopsied. - Blood in the entire examined colon.  NO NEW EXAMS.    LABS REVIEWED PREVIOUS   01-01-20: wbc 15.9; hgb 11.5; hct 37.3; mcv 88.3 plt 319; glucose 119; bun 19; creat 0.87; k+  4.2; na++ 136; ca 8.3 GFR>60 01-26-20: wbc 10.4; hgb 10.9; hct 33.6; mcv 81.4 plt 273; glucose 83; bun 18; creat 1.00; k+ 4.6; na++ 136; ca 8.6 GFR>60  02-08-20: wbc 10.4; hgb 12.7; hct 39.9; mcv 81.9 plt 250; glucose 107; bun 20; creat 0.95; k+ 4.6 na++ 136; ca 8.6; GFR>60 04-09-20: wbc 10.9; hgb 11.8; hct 40.0; mcv 87.0 plt 194; glucose 99; bun 32; creat 1.03; k+ 4.8; na++ 141; ca 8.8 GFR>60; liver normal albumin 3.2 mag 1.6 04-11-20: wbc 7.3; hgb 9.5; hct 31.9; mcv 86.2 plt 178; glucose 82; bun 15; creat 1.05; k+ 4.0; na++ 139; ca 8.4 GFR>60 mag 1.8 04/12/2020: hgb 10.0; hct 33.4   TODAY  04-20-20: hgb 9.7; hct 32.5; glucose 100; bun 22; creat 1.16; k+ 4.0; na++ 139; ca 8.6 GFR>60  Review of Systems  Constitutional: Negative for malaise/fatigue.  Respiratory: Negative for cough and shortness of breath.   Cardiovascular: Negative for chest pain, palpitations and leg swelling.  Gastrointestinal:  Negative for abdominal pain, constipation and heartburn.  Musculoskeletal: Negative for back pain, joint pain and myalgias.  Skin: Negative.   Neurological: Negative for dizziness.  Psychiatric/Behavioral: The patient is not nervous/anxious.    Physical Exam Constitutional:      General: He is not in acute distress.    Appearance: He is well-developed. He is not diaphoretic.  Neck:     Thyroid: No thyromegaly.  Cardiovascular:     Rate and Rhythm: Normal rate and regular rhythm.     Pulses: Normal pulses.     Heart sounds: Normal heart sounds.  Pulmonary:     Effort: Pulmonary effort is normal. No respiratory distress.     Breath sounds: Normal breath sounds.  Abdominal:     General: Bowel sounds are normal. There is no distension.     Palpations: Abdomen is soft.     Tenderness: There is no abdominal tenderness.  Musculoskeletal:     Cervical back: Neck supple.     Right lower leg: No edema.     Left lower leg: No edema.     Comments: Is able to move all extremities  Right hand contracted       Lymphadenopathy:     Cervical: No cervical adenopathy.  Skin:    General: Skin is warm and dry.  Neurological:     Mental Status: He is alert. Mental status is at baseline.  Psychiatric:        Mood and Affect: Mood normal.      ASSESSMENT/ PLAN:  TODAY  1. Adenocarcinoma of colon: newly diagnosed has see Dr. Arnoldo Morale; he is a very poor surgical candidate is awaiting cardiology for surgical clearance. Will monitor his status.   2. Cerebrovascular accident (CVA) unspecified mechanism: is stable will continue asa 81 mg daily and plavix 75 mg daily   3. Dementia associated with other underlying disease without behavioral disturbance: is stable weight is 138 pounds previous 141 pounds; will continue to monitor his status.   PREVIOUS   4. Spinal stenosis lumbar region with neurogenic claudication; is stable will continue cymbalta 60 mg daily and has prn tylenol  5. Severe  protein calorie malnutrition: is stable weight is 138 pounds; albumin 3.2 will continue supplements as directed.   6. Chronic constipation: is stable will continue miralax 34 mg twice daily   7. Hyperlipidemia: LDL goal <100 is stable will continue crestor 10 mg daily    8. Major depression recurrent chronic: is stable will continue cymbalta 60 mg  daily    9. Primary hypertension: is stable b/p 134/81 will continue to monitor her status.   10. PAD (Peripheral artery disease) is stable will continue plavix 75 mg daily   11. Aortic atherosclerosis: (ct 01-01-20) will monitor      Ok Edwards NP [facility]  Contact [phone] Monday through Friday 8am- 5pm  After hours call [phone]

## 2020-05-19 LAB — CEA: CEA: 3.9 ng/mL (ref 0.0–4.7)

## 2020-06-03 DIAGNOSIS — F0390 Unspecified dementia without behavioral disturbance: Secondary | ICD-10-CM | POA: Diagnosis not present

## 2020-06-03 DIAGNOSIS — C182 Malignant neoplasm of ascending colon: Secondary | ICD-10-CM | POA: Diagnosis not present

## 2020-06-03 DIAGNOSIS — Z515 Encounter for palliative care: Secondary | ICD-10-CM | POA: Diagnosis not present

## 2020-06-04 DIAGNOSIS — F330 Major depressive disorder, recurrent, mild: Secondary | ICD-10-CM | POA: Diagnosis not present

## 2020-06-10 ENCOUNTER — Other Ambulatory Visit: Payer: Self-pay | Admitting: Family Medicine

## 2020-06-10 ENCOUNTER — Telehealth: Payer: Self-pay | Admitting: Cardiology

## 2020-06-10 DIAGNOSIS — C189 Malignant neoplasm of colon, unspecified: Secondary | ICD-10-CM

## 2020-06-10 DIAGNOSIS — J869 Pyothorax without fistula: Secondary | ICD-10-CM

## 2020-06-10 NOTE — Telephone Encounter (Signed)
   [facility] Pre-operative Risk Assessment    HEARTCARE STAFF: - Please ensure there is not already an duplicate clearance open for this procedure. - Under Visit Info/Reason for Call, type in Other and utilize the format Clearance MM/DD/YY or Clearance TBD. Do not use dashes or single digits. - If request is for dental extraction, please clarify the # of teeth to be extracted.  Request for surgical clearance:  What type of surgery is being performed? Adenocarcinoma of colon   When is this surgery scheduled? TBD  What type of clearance is required (medical clearance vs. Pharmacy clearance to hold med vs. Both)? Both  Are there any medications that need to be held prior to surgery and how long? Not specified  Practice name and name of physician performing surgery? [facility]   6.  What is the office phone number? [phone]   7.   What is the office fax number? [phone]  8.   Anesthesia type (None, local, MAC, general) ? Not specified   Nelda Marseille 06/10/2020, 3:01 PM  _________________________________________________________________   (provider comments below)   Patient's family is not ready to proceed with surgery per Lovey Newcomer at Dr Adline Mango office.Please disregard this!

## 2020-06-11 DIAGNOSIS — F330 Major depressive disorder, recurrent, mild: Secondary | ICD-10-CM | POA: Diagnosis not present

## 2020-06-18 DIAGNOSIS — F330 Major depressive disorder, recurrent, mild: Secondary | ICD-10-CM | POA: Diagnosis not present

## 2020-06-24 ENCOUNTER — Non-Acute Institutional Stay (SKILLED_NURSING_FACILITY): Payer: Medicare HMO | Admitting: Adult Health

## 2020-06-24 ENCOUNTER — Encounter: Payer: Self-pay | Admitting: Adult Health

## 2020-06-24 DIAGNOSIS — K5909 Other constipation: Secondary | ICD-10-CM

## 2020-06-24 DIAGNOSIS — L602 Onychogryphosis: Secondary | ICD-10-CM | POA: Diagnosis not present

## 2020-06-24 DIAGNOSIS — E441 Mild protein-calorie malnutrition: Secondary | ICD-10-CM

## 2020-06-24 DIAGNOSIS — Z66 Do not resuscitate: Secondary | ICD-10-CM | POA: Diagnosis not present

## 2020-06-24 DIAGNOSIS — M48062 Spinal stenosis, lumbar region with neurogenic claudication: Secondary | ICD-10-CM

## 2020-06-24 DIAGNOSIS — E785 Hyperlipidemia, unspecified: Secondary | ICD-10-CM | POA: Diagnosis not present

## 2020-06-24 DIAGNOSIS — M24571 Contracture, right ankle: Secondary | ICD-10-CM | POA: Diagnosis not present

## 2020-06-24 DIAGNOSIS — I739 Peripheral vascular disease, unspecified: Secondary | ICD-10-CM | POA: Diagnosis not present

## 2020-06-24 DIAGNOSIS — M24572 Contracture, left ankle: Secondary | ICD-10-CM | POA: Diagnosis not present

## 2020-06-24 NOTE — Progress Notes (Signed)
Location:  [place] Room Number: 104-W Place of Service:  SNF (31)   CODE STATUS: DNR  No Known Allergies  Chief Complaint  Patient presents with  . Medical Management of Chronic Issues         Spinal stenosis lumbar region with neurogenic claudication:       Severe protein calorie malnutrition:     Chronic constipation:    Hyperlipidemia LDL goal <100    HPI:  He is a 80 year old long term resident of this facility being seen for the management of his chronic illnesses:Spinal stenosis lumbar region with neurogenic claudication:       Severe protein calorie malnutrition:     Chronic constipation:    Hyperlipidemia LDL goal <100. There are no reports of uncontrolled pain. His appetite is good. There are no reports of anxiety or agitation.    Past Medical History:  Diagnosis Date  . Anemia   . Back pain   . Cerebral infarction ([place])   . Dysarthria following cerebral infarction   . Hyperlipemia   . Major depressive disorder   . Pyothorax ([place])   . Renal disorder    kidney stones  . Spinal stenosis, lumbar region, with neurogenic claudication 07/07/2014  . Unspecified dementia without behavioral disturbance [facility])     Past Surgical History:  Procedure Laterality Date  . BIOPSY  04/13/2020   Procedure: BIOPSY;  Surgeon: Eloise Harman, DO;  [facility];  Service: Endoscopy;;  . CATARACT EXTRACTION W/PHACO Left 08/22/2016   Procedure: CATARACT EXTRACTION PHACO AND INTRAOCULAR LENS PLACEMENT ([place]) Left;  Surgeon: [name], MD;  Location: [place];  Service: Ophthalmology;  Laterality: Left;  . CATARACT EXTRACTION W/PHACO Right 10/25/2016   Procedure: CATARACT EXTRACTION PHACO AND INTRAOCULAR LENS PLACEMENT ([place]) WITH ISTENT RIGHT  ;  Surgeon: [name], MD;  Location: [place];  Service: Ophthalmology;  Laterality: Right;  TOPICAL RIGHT  ISTENT   trabecular bypass stent was implanted/explanted.  .  COLONOSCOPY WITH PROPOFOL N/A 04/13/2020   Procedure: COLONOSCOPY WITH PROPOFOL;  Surgeon: Eloise Harman, DO;  [facility];  Service: Endoscopy;  Laterality: N/A;  . FLEXIBLE SIGMOIDOSCOPY  04/11/2020   Procedure: FLEXIBLE SIGMOIDOSCOPY;  Surgeon: Montez Morita, Quillian Quince, MD;  [facility];  Service: Gastroenterology;;  . Otho Darner SIGMOIDOSCOPY  04/12/2020   Procedure: Beryle Quant;  Surgeon: Montez Morita, Quillian Quince, MD;  [facility];  Service: Gastroenterology;;  . [place]    . IR PERC PLEURAL DRAIN W/INDWELL CATH W/IMG GUIDE  01/03/2020  . KNEE SURGERY Right   . NECK SURGERY      Social History   Socioeconomic History  . Marital status: Widowed    Spouse name: Not on file  . Number of children: Not on file  . Years of education: Not on file  . Highest education level: Not on file  Occupational History  . Not on file  Tobacco Use  . Smoking status: Current Every Day Smoker    Packs/day: 0.75    Types: Cigarettes  . Smokeless tobacco: Never Used  Vaping Use  . Vaping Use: Never used  Substance and Sexual Activity  . Alcohol use: No  . Drug use: Never  . Sexual activity: Not on file  Other Topics Concern  . Not on file  Social History Narrative  . Not on file   Social Determinants of Health   Financial Resource Strain: Not  on file  Food Insecurity: Not on file  Transportation Needs: Not on file  Physical Activity: Not on file  Stress: Not on file  Social Connections: Not on file  Intimate Partner Violence: Not on file   Family History  Problem Relation Age of Onset  . Diabetes Mother   . Diabetes Brother   . Diabetes Sister       VITAL SIGNS BP 139/79   Pulse (!) 59   Temp 98.4 F (36.9 C)   Resp 18   Ht 5\' 5"  (1.651 m)   Wt 139 lb 9.6 oz (63.3 kg)   SpO2 96%   BMI 23.23 kg/m   Outpatient Encounter Medications as of 06/24/2020  Medication Sig  . acetaminophen (TYLENOL) 325 MG tablet  Take 2 tablets (650 mg total) by mouth every 6 (six) hours as needed for mild pain, fever or headache.  . ARTIFICIAL TEARS 0.2-0.2-1 % SOLN Place 2 drops into both eyes in the morning and at bedtime.   [name] aspirin EC 81 MG tablet Take 81 mg by mouth daily. Swallow whole.  Roseanne Kaufman Peru-Castor Oil (VENELEX) OINT Apply 1 application topically as directed. Apply to sacrum, coccyx and bilateral buttocks qshift  . DULoxetine (CYMBALTA) 60 MG capsule Take 1 capsule (60 mg total) by mouth daily.  . Multiple Vitamins-Minerals (SENIOR MULTIVITAMIN PLUS PO) Take 1 tablet by mouth daily in the afternoon.  . NON FORMULARY Dist NAS  . polyethylene glycol (MIRALAX / GLYCOLAX) 17 g packet Take 34 g by mouth 2 (two) times daily as needed (Mix with at least 4 ounces of liquid and drink).  . rosuvastatin (CRESTOR) 10 MG tablet Take 1 tablet (10 mg total) by mouth daily.  . [DISCONTINUED] polyethylene glycol (MIRALAX / GLYCOLAX) 17 g packet Take 34 g by mouth 2 (two) times daily. (Patient not taking: Reported on 06/24/2020)   No facility-administered encounter medications on file as of 06/24/2020.     SIGNIFICANT DIAGNOSTIC EXAMS   PREVIOUS    01-01-20; ct of chest:  1. Moderate-size loculated right pleural effusion, which may reflect an empyema. Most of the right lower lobe is opacified consistent with pneumonia, atelectasis or a combination. 2. Additional mild dependent linear/reticular atelectasis in the right upper and middle lobes and left lower lobe. Trace left pleural effusion. 3. No visualized centrally obstructing mass. 4. Emphysema and mild aortic atherosclerosis. Aortic Atherosclerosis and Emphysema   03-02-20: ct of head;  1. No acute intracranial abnormality. 2. Expected evolution of right corona radiata lacunar infarct from prior exam. Remote lacunar infarct in the right thalamus. 3. Stable atrophy and chronic small vessel ischemia.  04-09-20: ct of abdomen and pelvis:  1. Mild wall thickening  of the rectum, may represent proctitis. No other explanation for GI bleed. No significant diverticular disease. 2. Consolidative opacity in the dependent right lower lobe with small right pleural effusion. Findings may represent pneumonia or aspiration. Minimal patchy left basilar opacity. 3. Equivocal pre pyloric gastric wall thickening, can be seen with gastritis. 4. Cholelithiasis. Limited assessment for gallbladder inflammation given motion. 5. Bilateral renal scarring, right greater than left. 6. Enlarged prostate gland. Wall thickening at the bladder base may be related to chronic bladder outlet obstruction. Small bladder diverticula. 7. Bilateral femoral head avascular necrosis without subchondral collapse. Aortic Atherosclerosis   04-13-20: colonoscopy  - Diverticulosis in the sigmoid colon, in the descending colon and in the transverse colon. - Rule out malignancy, partially obstructing tumor in the ascending colon. Biopsied. - Blood  in the entire examined colon.  NO NEW EXAMS.    LABS REVIEWED PREVIOUS   01-01-20: wbc 15.9; hgb 11.5; hct 37.3; mcv 88.3 plt 319; glucose 119; bun 19; creat 0.87; k+ 4.2; na++ 136; ca 8.3 GFR>60 01-26-20: wbc 10.4; hgb 10.9; hct 33.6; mcv 81.4 plt 273; glucose 83; bun 18; creat 1.00; k+ 4.6; na++ 136; ca 8.6 GFR>60  02-08-20: wbc 10.4; hgb 12.7; hct 39.9; mcv 81.9 plt 250; glucose 107; bun 20; creat 0.95; k+ 4.6 na++ 136; ca 8.6; GFR>60 04-09-20: wbc 10.9; hgb 11.8; hct 40.0; mcv 87.0 plt 194; glucose 99; bun 32; creat 1.03; k+ 4.8; na++ 141; ca 8.8 GFR>60; liver normal albumin 3.2 mag 1.6 04-11-20: wbc 7.3; hgb 9.5; hct 31.9; mcv 86.2 plt 178; glucose 82; bun 15; creat 1.05; k+ 4.0; na++ 139; ca 8.4 GFR>60 mag 1.8 03/31/2020: hgb 10.0; hct 33.4  04-20-20: hgb 9.7; hct 32.5; glucose 100; bun 22; creat 1.16; k+ 4.0; na++ 139; ca 8.6 GFR>60   TODAY  05-18-20: CEA 3.9  Review of Systems  Constitutional: Negative for malaise/fatigue.  Respiratory: Negative  for cough and shortness of breath.   Cardiovascular: Negative for chest pain, palpitations and leg swelling.  Gastrointestinal: Negative for abdominal pain, constipation and heartburn.  Musculoskeletal: Negative for back pain, joint pain and myalgias.  Skin: Negative.   Neurological: Negative for dizziness.  Psychiatric/Behavioral: The patient is not nervous/anxious.       Physical Exam Constitutional:      General: He is not in acute distress.    Appearance: He is well-developed. He is not diaphoretic.  Neck:     Thyroid: No thyromegaly.  Cardiovascular:     Rate and Rhythm: Normal rate and regular rhythm.     Pulses: Normal pulses.     Heart sounds: Normal heart sounds.  Pulmonary:     Effort: Pulmonary effort is normal. No respiratory distress.     Breath sounds: Normal breath sounds.  Abdominal:     General: Bowel sounds are normal. There is no distension.     Palpations: Abdomen is soft.     Tenderness: There is no abdominal tenderness.  Musculoskeletal:     Cervical back: Neck supple.     Right lower leg: No edema.     Left lower leg: No edema.     Comments: Is able to move all extremities  Right hand contracted        Lymphadenopathy:     Cervical: No cervical adenopathy.  Skin:    General: Skin is warm and dry.  Neurological:     Mental Status: He is alert. Mental status is at baseline.  Psychiatric:        Mood and Affect: Mood normal.      ASSESSMENT/ PLAN:  TODAY  1. Spinal stenosis lumbar region with neurogenic claudication: is stable will continue cymbalta 60 mg daily has prn tylenol  2. Severe protein calorie malnutrition: is stable weight is 139 pounds albumin 3.2 will monitor will continue supplements as directed   3. Chronic constipation: is table will continue miralax 34 gm twice daily   4. Hyperlipidemia LDL goal <100 is stable will continue crestor 10 mg daily    PREVIOUS   5. Major depression recurrent chronic: is stable will continue  cymbalta 60 mg daily   6. Primary hypertension: is stable b/p 134/81 will continue to monitor her status.   7. PAD (Peripheral artery disease) is stable will continue plavix 75 mg daily  8. Aortic atherosclerosis: (ct 01-01-20) will monitor    9. Adenocarcinoma of colon: newly diagnosed has see Dr. Arnoldo Morale; he is a very poor surgical candidate is awaiting cardiology for surgical clearance. Will monitor his status.   10. Cerebrovascular accident (CVA) unspecified mechanism: is stable will continue asa 81 mg daily and plavix 75 mg daily   11. Dementia associated with other underlying disease without behavioral disturbance: is stable weight is 141 pounds will continue to monitor his status.    Ok Edwards NP [facility]  Contact [phone] Monday through Friday 8am- 5pm  After hours call [phone]

## 2020-06-25 DIAGNOSIS — F330 Major depressive disorder, recurrent, mild: Secondary | ICD-10-CM | POA: Diagnosis not present

## 2020-06-26 ENCOUNTER — Ambulatory Visit: Payer: Medicare HMO | Admitting: Podiatry

## 2020-07-01 DIAGNOSIS — F0390 Unspecified dementia without behavioral disturbance: Secondary | ICD-10-CM | POA: Diagnosis not present

## 2020-07-01 DIAGNOSIS — Z515 Encounter for palliative care: Secondary | ICD-10-CM | POA: Diagnosis not present

## 2020-07-01 DIAGNOSIS — C182 Malignant neoplasm of ascending colon: Secondary | ICD-10-CM | POA: Diagnosis not present

## 2020-07-02 DIAGNOSIS — F330 Major depressive disorder, recurrent, mild: Secondary | ICD-10-CM | POA: Diagnosis not present

## 2020-07-09 ENCOUNTER — Non-Acute Institutional Stay (SKILLED_NURSING_FACILITY): Payer: Medicare HMO | Admitting: Adult Health

## 2020-07-09 ENCOUNTER — Encounter: Payer: Self-pay | Admitting: Adult Health

## 2020-07-09 DIAGNOSIS — I700 Atherosclerosis of aorta: Secondary | ICD-10-CM

## 2020-07-09 DIAGNOSIS — F330 Major depressive disorder, recurrent, mild: Secondary | ICD-10-CM | POA: Diagnosis not present

## 2020-07-09 DIAGNOSIS — F0280 Dementia in other diseases classified elsewhere without behavioral disturbance: Secondary | ICD-10-CM | POA: Diagnosis not present

## 2020-07-09 DIAGNOSIS — C189 Malignant neoplasm of colon, unspecified: Secondary | ICD-10-CM | POA: Diagnosis not present

## 2020-07-09 NOTE — Progress Notes (Signed)
Location:  [place] Room Number: 104-W Place of Service:  SNF (31)   CODE STATUS: DNR  No Known Allergies  Chief Complaint  Patient presents with   Acute Visit    Care plan meeting.    HPI:  We have come together for her care plan meeting. BIMS 6/15; mood 4/30: anxious trouble concentrating. He is limited to extensive assist with adls. He is incontinent of bladder and bowel. He is able to feed himself. He is nonambulatory; there have been no falls. Therapy none at this time  Dietary 139.6 pounds up to 147.6 pounds has a good appetite with NAS diet. [name] He continues to be followed for his chronic illnesses including: Aortic atherosclerosis Adenocarcinoma of colon  Dementia associated with other underlying disease without behavioral disturbance   Past Medical History:  Diagnosis Date   Anemia    Back pain    Cerebral infarction [facility])    Dysarthria following cerebral infarction    Hyperlipemia    Major depressive disorder    Pyothorax ([facility])    Renal disorder    kidney stones   Spinal stenosis, lumbar region, with neurogenic claudication 07/07/2014   Unspecified dementia without behavioral disturbance [facility])     Past Surgical History:  Procedure Laterality Date   BIOPSY  04/13/2020   Procedure: BIOPSY;  Surgeon: Eloise Harman, DO;  [facility];  Service: Endoscopy;;   CATARACT EXTRACTION W/PHACO Left 08/22/2016   Procedure: CATARACT EXTRACTION PHACO AND INTRAOCULAR LENS PLACEMENT ([place]) Left;  Surgeon: [name], MD;  Location: [place];  Service: Ophthalmology;  Laterality: Left;   CATARACT EXTRACTION W/PHACO Right 10/25/2016   Procedure: CATARACT EXTRACTION PHACO AND INTRAOCULAR LENS PLACEMENT ([place]) WITH ISTENT RIGHT  ;  Surgeon: [name], MD;  Location: [place];  Service: Ophthalmology;  Laterality: Right;  TOPICAL RIGHT  ISTENT   trabecular bypass stent was implanted/explanted.   COLONOSCOPY WITH  PROPOFOL N/A 04/13/2020   Procedure: COLONOSCOPY WITH PROPOFOL;  Surgeon: Eloise Harman, DO;  [facility];  Service: Endoscopy;  Laterality: N/A;   FLEXIBLE SIGMOIDOSCOPY  04/11/2020   Procedure: FLEXIBLE SIGMOIDOSCOPY;  Surgeon: Harvel Quale, MD;  [facility];  Service: Gastroenterology;;   Beryle Quant  04/12/2020   Procedure: Beryle Quant;  Surgeon: Montez Morita, Quillian Quince, MD;  [facility];  Service: Gastroenterology;;   INGUINAL HERNIA REPAIR     IR PERC PLEURAL DRAIN W/INDWELL CATH W/IMG GUIDE  01/03/2020   KNEE SURGERY Right    NECK SURGERY      Social History   Socioeconomic History   Marital status: Widowed    Spouse name: Not on file   Number of children: Not on file   Years of education: Not on file   Highest education level: Not on file  Occupational History   Not on file  Tobacco Use   Smoking status: Every Day    Packs/day: 0.75    Pack years: 0.00    Types: Cigarettes   Smokeless tobacco: Never  Vaping Use   Vaping Use: Never used  Substance and Sexual Activity   Alcohol use: No   Drug use: Never   Sexual activity: Not on file  Other Topics Concern   Not on file  Social History Narrative   Not on file   Social Determinants of Health   Financial Resource Strain: Not on file  Food Insecurity: Not on file  Transportation Needs:  Not on file  Physical Activity: Not on file  Stress: Not on file  Social Connections: Not on file  Intimate Partner Violence: Not on file   Family History  Problem Relation Age of Onset   Diabetes Mother    Diabetes Brother    Diabetes Sister       VITAL SIGNS BP 111/77   Pulse 78   Temp 97.8 F (36.6 C)   Resp 18   Ht 5\' 5"  (1.651 m)   Wt 147 lb 9.6 oz (67 kg)   SpO2 96%   BMI 24.56 kg/m   Outpatient Encounter Medications as of 07/09/2020  Medication Sig   acetaminophen (TYLENOL) 325 MG tablet Take 2 tablets (650 mg total) by mouth every 6  (six) hours as needed for mild pain, fever or headache.   ARTIFICIAL TEARS 0.2-0.2-1 % SOLN Place 2 drops into both eyes in the morning and at bedtime.    aspirin EC 81 MG tablet Take 81 mg by mouth daily. Swallow whole.   Balsam [place]-Castor Oil (VENELEX) OINT Apply 1 application topically as directed. Apply to sacrum, coccyx and bilateral buttocks qshift   DULoxetine (CYMBALTA) 60 MG capsule Take 1 capsule (60 mg total) by mouth daily.   Multiple Vitamins-Minerals (SENIOR MULTIVITAMIN PLUS PO) Take 1 tablet by mouth daily in the afternoon.   NON FORMULARY Dist NAS   polyethylene glycol (MIRALAX / GLYCOLAX) 17 g packet Take 34 g by mouth 2 (two) times daily as needed (Mix with at least 4 ounces of liquid and drink).   rosuvastatin (CRESTOR) 10 MG tablet Take 1 tablet (10 mg total) by mouth daily.   No facility-administered encounter medications on file as of 07/09/2020.     SIGNIFICANT DIAGNOSTIC EXAMS   PREVIOUS    01-01-20; ct of chest:  1. Moderate-size loculated right pleural effusion, which may reflect an empyema. Most of the right lower lobe is opacified consistent with pneumonia, atelectasis or a combination. 2. Additional mild dependent linear/reticular atelectasis in the right upper and middle lobes and left lower lobe. Trace left pleural effusion. 3. No visualized centrally obstructing mass. 4. Emphysema and mild aortic atherosclerosis. Aortic Atherosclerosis and Emphysema   03-02-20: ct of head;  1. No acute intracranial abnormality. 2. Expected evolution of right corona radiata lacunar infarct from prior exam. Remote lacunar infarct in the right thalamus. 3. Stable atrophy and chronic small vessel ischemia.  04-09-20: ct of abdomen and pelvis:  1. Mild wall thickening of the rectum, may represent proctitis. No other explanation for GI bleed. No significant diverticular disease. 2. Consolidative opacity in the dependent right lower lobe with small right pleural effusion.  Findings may represent pneumonia or aspiration. Minimal patchy left basilar opacity. 3. Equivocal pre pyloric gastric wall thickening, can be seen with gastritis. 4. Cholelithiasis. Limited assessment for gallbladder inflammation given motion. 5. Bilateral renal scarring, right greater than left. 6. Enlarged prostate gland. Wall thickening at the bladder base may be related to chronic bladder outlet obstruction. Small bladder diverticula. 7. Bilateral femoral head avascular necrosis without subchondral collapse. Aortic Atherosclerosis   04-13-20: colonoscopy  - Diverticulosis in the sigmoid colon, in the descending colon and in the transverse colon. - Rule out malignancy, partially obstructing tumor in the ascending colon. Biopsied. - Blood in the entire examined colon.  NO NEW EXAMS.    LABS REVIEWED PREVIOUS   01-01-20: wbc 15.9; hgb 11.5; hct 37.3; mcv 88.3 plt 319; glucose 119; bun 19; creat 0.87; k+ 4.2; na++ 136;  ca 8.3 GFR>60 01-26-20: wbc 10.4; hgb 10.9; hct 33.6; mcv 81.4 plt 273; glucose 83; bun 18; creat 1.00; k+ 4.6; na++ 136; ca 8.6 GFR>60  02-08-20: wbc 10.4; hgb 12.7; hct 39.9; mcv 81.9 plt 250; glucose 107; bun 20; creat 0.95; k+ 4.6 na++ 136; ca 8.6; GFR>60 04-09-20: wbc 10.9; hgb 11.8; hct 40.0; mcv 87.0 plt 194; glucose 99; bun 32; creat 1.03; k+ 4.8; na++ 141; ca 8.8 GFR>60; liver normal albumin 3.2 mag 1.6 04-11-20: wbc 7.3; hgb 9.5; hct 31.9; mcv 86.2 plt 178; glucose 82; bun 15; creat 1.05; k+ 4.0; na++ 139; ca 8.4 GFR>60 mag 1.8 04/29/2020: hgb 10.0; hct 33.4  04-20-20: hgb 9.7; hct 32.5; glucose 100; bun 22; creat 1.16; k+ 4.0; na++ 139; ca 8.6 GFR>60 05-18-20: CEA 3.9  NO NEW LABS.   Review of Systems  Constitutional:  Negative for malaise/fatigue.  Respiratory:  Negative for cough and shortness of breath.   Cardiovascular:  Negative for chest pain, palpitations and leg swelling.  Gastrointestinal:  Negative for abdominal pain, constipation and heartburn.   Musculoskeletal:  Negative for back pain, joint pain and myalgias.  Skin: Negative.   Neurological:  Negative for dizziness.  Psychiatric/Behavioral:  The patient is not nervous/anxious.    Physical Exam Constitutional:      General: He is not in acute distress.    Appearance: He is well-developed. He is not diaphoretic.  Neck:     Thyroid: No thyromegaly.  Cardiovascular:     Rate and Rhythm: Normal rate and regular rhythm.     Pulses: Normal pulses.     Heart sounds: Normal heart sounds.  Pulmonary:     Effort: Pulmonary effort is normal. No respiratory distress.     Breath sounds: Normal breath sounds.  Abdominal:     General: Bowel sounds are normal. There is no distension.     Palpations: Abdomen is soft.     Tenderness: There is no abdominal tenderness.  Musculoskeletal:     Cervical back: Neck supple.     Right lower leg: No edema.     Left lower leg: No edema.     Comments: : Is able to move all extremities  Right hand contracted         Lymphadenopathy:     Cervical: No cervical adenopathy.  Skin:    General: Skin is warm and dry.  Neurological:     Mental Status: He is alert. Mental status is at baseline.  Psychiatric:        Mood and Affect: Mood normal.      ASSESSMENT/ PLAN:  TODAY  Aortic atherosclerosis Adenocarcinoma of colon  Dementia associated with other underlying disease without behavioral disturbance  Will continue current medications Will continue current plan of care Will continue to monitor his status.   Time spent with patient: 40 minutes: overall health status; goals of care; medications.    Ok Edwards NP [facility]  Contact [phone] Monday through Friday 8am- 5pm  After hours call [phone]

## 2020-07-10 ENCOUNTER — Ambulatory Visit: Payer: Medicare HMO | Admitting: Cardiology

## 2020-07-16 DIAGNOSIS — F330 Major depressive disorder, recurrent, mild: Secondary | ICD-10-CM | POA: Diagnosis not present

## 2020-07-21 DIAGNOSIS — G311 Senile degeneration of brain, not elsewhere classified: Secondary | ICD-10-CM | POA: Diagnosis not present

## 2020-07-21 DIAGNOSIS — J9601 Acute respiratory failure with hypoxia: Secondary | ICD-10-CM | POA: Diagnosis not present

## 2020-07-21 DIAGNOSIS — I639 Cerebral infarction, unspecified: Secondary | ICD-10-CM | POA: Diagnosis not present

## 2020-07-21 DIAGNOSIS — R279 Unspecified lack of coordination: Secondary | ICD-10-CM | POA: Diagnosis not present

## 2020-07-21 DIAGNOSIS — F331 Major depressive disorder, recurrent, moderate: Secondary | ICD-10-CM | POA: Diagnosis not present

## 2020-07-21 DIAGNOSIS — M6281 Muscle weakness (generalized): Secondary | ICD-10-CM | POA: Diagnosis not present

## 2020-07-22 DIAGNOSIS — J9601 Acute respiratory failure with hypoxia: Secondary | ICD-10-CM | POA: Diagnosis not present

## 2020-07-22 DIAGNOSIS — M6281 Muscle weakness (generalized): Secondary | ICD-10-CM | POA: Diagnosis not present

## 2020-07-22 DIAGNOSIS — I639 Cerebral infarction, unspecified: Secondary | ICD-10-CM | POA: Diagnosis not present

## 2020-07-22 DIAGNOSIS — R279 Unspecified lack of coordination: Secondary | ICD-10-CM | POA: Diagnosis not present

## 2020-07-23 DIAGNOSIS — J9601 Acute respiratory failure with hypoxia: Secondary | ICD-10-CM | POA: Diagnosis not present

## 2020-07-23 DIAGNOSIS — M6281 Muscle weakness (generalized): Secondary | ICD-10-CM | POA: Diagnosis not present

## 2020-07-23 DIAGNOSIS — I639 Cerebral infarction, unspecified: Secondary | ICD-10-CM | POA: Diagnosis not present

## 2020-07-23 DIAGNOSIS — R918 Other nonspecific abnormal finding of lung field: Secondary | ICD-10-CM | POA: Diagnosis not present

## 2020-07-23 DIAGNOSIS — R279 Unspecified lack of coordination: Secondary | ICD-10-CM | POA: Diagnosis not present

## 2020-07-23 DIAGNOSIS — F330 Major depressive disorder, recurrent, mild: Secondary | ICD-10-CM | POA: Diagnosis not present

## 2020-07-24 DIAGNOSIS — J9601 Acute respiratory failure with hypoxia: Secondary | ICD-10-CM | POA: Diagnosis not present

## 2020-07-24 DIAGNOSIS — M6281 Muscle weakness (generalized): Secondary | ICD-10-CM | POA: Diagnosis not present

## 2020-07-24 DIAGNOSIS — R279 Unspecified lack of coordination: Secondary | ICD-10-CM | POA: Diagnosis not present

## 2020-07-24 DIAGNOSIS — I639 Cerebral infarction, unspecified: Secondary | ICD-10-CM | POA: Diagnosis not present

## 2020-07-26 DIAGNOSIS — I639 Cerebral infarction, unspecified: Secondary | ICD-10-CM | POA: Diagnosis not present

## 2020-07-26 DIAGNOSIS — R279 Unspecified lack of coordination: Secondary | ICD-10-CM | POA: Diagnosis not present

## 2020-07-26 DIAGNOSIS — M6281 Muscle weakness (generalized): Secondary | ICD-10-CM | POA: Diagnosis not present

## 2020-07-26 DIAGNOSIS — J9601 Acute respiratory failure with hypoxia: Secondary | ICD-10-CM | POA: Diagnosis not present

## 2020-07-27 DIAGNOSIS — M6281 Muscle weakness (generalized): Secondary | ICD-10-CM | POA: Diagnosis not present

## 2020-07-27 DIAGNOSIS — J9601 Acute respiratory failure with hypoxia: Secondary | ICD-10-CM | POA: Diagnosis not present

## 2020-07-27 DIAGNOSIS — I639 Cerebral infarction, unspecified: Secondary | ICD-10-CM | POA: Diagnosis not present

## 2020-07-27 DIAGNOSIS — R279 Unspecified lack of coordination: Secondary | ICD-10-CM | POA: Diagnosis not present

## 2020-07-28 ENCOUNTER — Other Ambulatory Visit (HOSPITAL_COMMUNITY)
Admission: RE | Admit: 2020-07-28 | Discharge: 2020-07-28 | Disposition: A | Discharge status: Home / Self Care | Payer: Medicare HMO | Source: Skilled Nursing Facility | Attending: Internal Medicine | Admitting: Internal Medicine

## 2020-07-28 DIAGNOSIS — J9601 Acute respiratory failure with hypoxia: Secondary | ICD-10-CM | POA: Diagnosis not present

## 2020-07-28 DIAGNOSIS — M6281 Muscle weakness (generalized): Secondary | ICD-10-CM | POA: Diagnosis not present

## 2020-07-28 DIAGNOSIS — I639 Cerebral infarction, unspecified: Secondary | ICD-10-CM | POA: Diagnosis not present

## 2020-07-28 DIAGNOSIS — R279 Unspecified lack of coordination: Secondary | ICD-10-CM | POA: Diagnosis not present

## 2020-07-28 DIAGNOSIS — R918 Other nonspecific abnormal finding of lung field: Secondary | ICD-10-CM | POA: Diagnosis not present

## 2020-07-28 LAB — CBC
HCT: 41.9 % (ref 39.0–52.0)
Hemoglobin: 12.5 g/dL — ABNORMAL LOW (ref 13.0–17.0)
MCH: 23.5 pg — ABNORMAL LOW (ref 26.0–34.0)
MCHC: 29.8 g/dL — ABNORMAL LOW (ref 30.0–36.0)
MCV: 78.9 fL — ABNORMAL LOW (ref 80.0–100.0)
Platelets: 238 10*3/uL (ref 150–400)
RBC: 5.31 MIL/uL (ref 4.22–5.81)
RDW: 17.3 % — ABNORMAL HIGH (ref 11.5–15.5)
WBC: 12.5 10*3/uL — ABNORMAL HIGH (ref 4.0–10.5)
nRBC: 0 % (ref 0.0–0.2)

## 2020-07-28 LAB — C-REACTIVE PROTEIN: CRP: 1.6 mg/dL — ABNORMAL HIGH (ref <1.0)

## 2020-07-28 LAB — BASIC METABOLIC PANEL
Anion gap: 8 (ref 5–15)
BUN: 23 mg/dL (ref 8–23)
CO2: 26 mmol/L (ref 22–32)
Calcium: 8.7 mg/dL — ABNORMAL LOW (ref 8.9–10.3)
Chloride: 103 mmol/L (ref 98–111)
Creatinine, Ser: 1.22 mg/dL (ref 0.61–1.24)
GFR, Estimated: >60 mL/min (ref >60)
Glucose, Bld: 106 mg/dL — ABNORMAL HIGH (ref 70–99)
Potassium: 4.2 mmol/L (ref 3.5–5.1)
Sodium: 137 mmol/L (ref 135–145)

## 2020-07-28 LAB — D-DIMER, QUANTITATIVE: D-Dimer, Quant: 0.49 ug/mL-FEU (ref 0.00–0.50)

## 2020-07-29 ENCOUNTER — Non-Acute Institutional Stay (SKILLED_NURSING_FACILITY): Payer: Medicare HMO | Admitting: Adult Health

## 2020-07-29 ENCOUNTER — Encounter: Payer: Self-pay | Admitting: Adult Health

## 2020-07-29 DIAGNOSIS — I739 Peripheral vascular disease, unspecified: Secondary | ICD-10-CM

## 2020-07-29 DIAGNOSIS — M6281 Muscle weakness (generalized): Secondary | ICD-10-CM | POA: Diagnosis not present

## 2020-07-29 DIAGNOSIS — F339 Major depressive disorder, recurrent, unspecified: Secondary | ICD-10-CM

## 2020-07-29 DIAGNOSIS — U071 COVID-19: Secondary | ICD-10-CM | POA: Diagnosis not present

## 2020-07-29 DIAGNOSIS — I639 Cerebral infarction, unspecified: Secondary | ICD-10-CM | POA: Diagnosis not present

## 2020-07-29 DIAGNOSIS — R279 Unspecified lack of coordination: Secondary | ICD-10-CM | POA: Diagnosis not present

## 2020-07-29 DIAGNOSIS — I10 Essential (primary) hypertension: Secondary | ICD-10-CM | POA: Diagnosis not present

## 2020-07-29 DIAGNOSIS — J9601 Acute respiratory failure with hypoxia: Secondary | ICD-10-CM | POA: Diagnosis not present

## 2020-07-29 NOTE — Progress Notes (Addendum)
Location:  [place] Room Number: 104 Place of Service:  SNF (31)   CODE STATUS: dnr   No Known Allergies  Chief Complaint  Patient presents with   Medical Management of Chronic Issues      Major depression recurrent chronic   COVID:   Primary hypertension;   PAD (Peripheral arterial disease)    HPI:  He is a 80 year old long term resident of this facility being seen for the management of his chronic illnesses: Major depression recurrent chronic   COVID:   Primary hypertension;   PAD (Peripheral arterial disease). There are no reports of coughing or shortness of breath; his weight is stable. There are no reports of fevers present. He as tested positive for covid he has been placed on antiviral medication.   Past Medical History:  Diagnosis Date   Anemia    Back pain    Cerebral infarction [facility])    Dysarthria following cerebral infarction    Hyperlipemia    Major depressive disorder    Pyothorax (HCC)    Renal disorder    kidney stones   Spinal stenosis, lumbar region, with neurogenic claudication 07/07/2014   Unspecified dementia without behavioral disturbance [facility])     Past Surgical History:  Procedure Laterality Date   BIOPSY  04/13/2020   Procedure: BIOPSY;  Surgeon: Eloise Harman, DO;  [facility];  Service: Endoscopy;;   CATARACT EXTRACTION W/PHACO Left 08/22/2016   Procedure: CATARACT EXTRACTION PHACO AND INTRAOCULAR LENS PLACEMENT ([place]) Left;  Surgeon: [name], MD;  Location: [place];  Service: Ophthalmology;  Laterality: Left;   CATARACT EXTRACTION W/PHACO Right 10/25/2016   Procedure: CATARACT EXTRACTION PHACO AND INTRAOCULAR LENS PLACEMENT ([place]) WITH ISTENT RIGHT  ;  Surgeon: [name], MD;  Location: [place];  Service: Ophthalmology;  Laterality: Right;  TOPICAL RIGHT  ISTENT   trabecular bypass stent was implanted/explanted.   COLONOSCOPY WITH PROPOFOL N/A 04/13/2020    Procedure: COLONOSCOPY WITH PROPOFOL;  Surgeon: Eloise Harman, DO;  [facility];  Service: Endoscopy;  Laterality: N/A;   FLEXIBLE SIGMOIDOSCOPY  04/11/2020   Procedure: FLEXIBLE SIGMOIDOSCOPY;  Surgeon: Harvel Quale, MD;  [facility];  Service: Gastroenterology;;   Beryle Quant  04/12/2020   Procedure: Beryle Quant;  Surgeon: Montez Morita, Quillian Quince, MD;  [facility];  Service: Gastroenterology;;   INGUINAL HERNIA REPAIR     IR PERC PLEURAL DRAIN W/INDWELL CATH W/IMG GUIDE  01/03/2020   KNEE SURGERY Right    NECK SURGERY      Social History   Socioeconomic History   Marital status: Widowed    Spouse name: Not on file   Number of children: Not on file   Years of education: Not on file   Highest education level: Not on file  Occupational History   Not on file  Tobacco Use   Smoking status: Every Day    Packs/day: 0.75    Pack years: 0.00    Types: Cigarettes   Smokeless tobacco: Never  Vaping Use   Vaping Use: Never used  Substance and Sexual Activity   Alcohol use: No   Drug use: Never   Sexual activity: Not on file  Other Topics Concern   Not on file  Social History Narrative   Not on file   Social Determinants of Health   Financial Resource Strain: Not on file  Food Insecurity: Not on file  Transportation Needs: Not on file  Physical Activity: Not on file  Stress: Not on file  Social Connections: Not on file  Intimate Partner Violence: Not on file   Family History  Problem Relation Age of Onset   Diabetes Mother    Diabetes Brother    Diabetes Sister       VITAL SIGNS BP 136/78   Pulse 69   Temp 97.8 F (36.6 C)   Resp 18   Ht 5\' 5"  (1.651 m)   Wt 147 lb 9.6 oz (67 kg)   BMI 24.56 kg/m   Outpatient Encounter Medications as of 07/29/2020  Medication Sig   doxycycline (VIBRAMYCIN) 100 MG capsule Take 100 mg by mouth 2 (two) times daily.   Molnupiravir 200 MG CAPS Take 4  capsules by mouth 2 (two) times daily.   acetaminophen (TYLENOL) 325 MG tablet Take 2 tablets (650 mg total) by mouth every 6 (six) hours as needed for mild pain, fever or headache.   ARTIFICIAL TEARS 0.2-0.2-1 % SOLN Place 2 drops into both eyes in the morning and at bedtime.    aspirin EC 81 MG tablet Take 81 mg by mouth daily. Swallow whole.   Balsam [place]-Castor Oil (VENELEX) OINT Apply 1 application topically as directed. Apply to sacrum, coccyx and bilateral buttocks qshift   DULoxetine (CYMBALTA) 60 MG capsule Take 1 capsule (60 mg total) by mouth daily.   Multiple Vitamins-Minerals (SENIOR MULTIVITAMIN PLUS PO) Take 1 tablet by mouth daily in the afternoon.   NON FORMULARY Dist NAS   polyethylene glycol (MIRALAX / GLYCOLAX) 17 g packet Take 34 g by mouth 2 (two) times daily as needed (Mix with at least 4 ounces of liquid and drink).   rosuvastatin (CRESTOR) 10 MG tablet Take 1 tablet (10 mg total) by mouth daily.   No facility-administered encounter medications on file as of 07/29/2020.     SIGNIFICANT DIAGNOSTIC EXAMS   PREVIOUS    01-01-20; ct of chest:  1. Moderate-size loculated right pleural effusion, which may reflect an empyema. Most of the right lower lobe is opacified consistent with pneumonia, atelectasis or a combination. 2. Additional mild dependent linear/reticular atelectasis in the right upper and middle lobes and left lower lobe. Trace left pleural effusion. 3. No visualized centrally obstructing mass. 4. Emphysema and mild aortic atherosclerosis. Aortic Atherosclerosis and Emphysema   03-02-20: ct of head;  1. No acute intracranial abnormality. 2. Expected evolution of right corona radiata lacunar infarct from prior exam. Remote lacunar infarct in the right thalamus. 3. Stable atrophy and chronic small vessel ischemia.  04-09-20: ct of abdomen and pelvis:  1. Mild wall thickening of the rectum, may represent proctitis. No other explanation for GI bleed. No  significant diverticular disease. 2. Consolidative opacity in the dependent right lower lobe with small right pleural effusion. Findings may represent pneumonia or aspiration. Minimal patchy left basilar opacity. 3. Equivocal pre pyloric gastric wall thickening, can be seen with gastritis. 4. Cholelithiasis. Limited assessment for gallbladder inflammation given motion. 5. Bilateral renal scarring, right greater than left. 6. Enlarged prostate gland. Wall thickening at the bladder base may be related to chronic bladder outlet obstruction. Small bladder diverticula. 7. Bilateral femoral head avascular necrosis without subchondral collapse. Aortic Atherosclerosis   04-13-20: colonoscopy  - Diverticulosis in the sigmoid colon, in the descending colon and in the transverse colon. - Rule out malignancy, partially obstructing tumor in the ascending colon. Biopsied. - Blood in the entire examined colon.  TODAY  07-23-20:  chest x-ray:  There is right lower lobe infiltrates seen.   07-28-20 Chest x-ray:  Mild patchy bibasilar densities right greater than left compatible with pneumonia. In comparison to prior exam; findings are mild worsened with new left basilar infiltrates Mild cardiomegaly Mild osteopenia Mild osteoarthritis    LABS REVIEWED PREVIOUS   01-01-20: wbc 15.9; hgb 11.5; hct 37.3; mcv 88.3 plt 319; glucose 119; bun 19; creat 0.87; k+ 4.2; na++ 136; ca 8.3 GFR>60 01-26-20: wbc 10.4; hgb 10.9; hct 33.6; mcv 81.4 plt 273; glucose 83; bun 18; creat 1.00; k+ 4.6; na++ 136; ca 8.6 GFR>60  02-08-20: wbc 10.4; hgb 12.7; hct 39.9; mcv 81.9 plt 250; glucose 107; bun 20; creat 0.95; k+ 4.6 na++ 136; ca 8.6; GFR>60 04-09-20: wbc 10.9; hgb 11.8; hct 40.0; mcv 87.0 plt 194; glucose 99; bun 32; creat 1.03; k+ 4.8; na++ 141; ca 8.8 GFR>60; liver normal albumin 3.2 mag 1.6 04-11-20: wbc 7.3; hgb 9.5; hct 31.9; mcv 86.2 plt 178; glucose 82; bun 15; creat 1.05; k+ 4.0; na++ 139; ca 8.4 GFR>60 mag  1.8 04/13/2020: hgb 10.0; hct 33.4  04-20-20: hgb 9.7; hct 32.5; glucose 100; bun 22; creat 1.16; k+ 4.0; na++ 139; ca 8.6 GFR>60 05-18-20: CEA 3.9  TODAY  07-28-20: wbc 12.5; hgb 12.5; hct 41.9; mcv 78.9 plt 238; glucose 106; bun 23; creat 1.22; k+ 4.2; na++ 137; ca 8.7 GFR >60; d-dimer: 0.49; CRP 1.6    Review of Systems  Constitutional:  Negative for malaise/fatigue.  Respiratory:  Negative for cough and shortness of breath.   Cardiovascular:  Negative for chest pain, palpitations and leg swelling.  Gastrointestinal:  Negative for abdominal pain, constipation and heartburn.  Musculoskeletal:  Negative for back pain, joint pain and myalgias.  Skin: Negative.   Neurological:  Negative for dizziness.  Psychiatric/Behavioral:  The patient is not nervous/anxious.    Physical Exam Constitutional:      General: He is not in acute distress.    Appearance: He is well-developed. He is not diaphoretic.  Neck:     Thyroid: No thyromegaly.  Cardiovascular:     Rate and Rhythm: Normal rate and regular rhythm.     Pulses: Normal pulses.     Heart sounds: Normal heart sounds.  Pulmonary:     Effort: Pulmonary effort is normal. No respiratory distress.     Breath sounds: Normal breath sounds.  Abdominal:     General: Bowel sounds are normal. There is no distension.     Palpations: Abdomen is soft.     Tenderness: There is no abdominal tenderness.  Musculoskeletal:     Cervical back: Neck supple.     Right lower leg: No edema.     Left lower leg: No edema.     Comments: Is able to move all extremities Right hand contracted          Lymphadenopathy:     Cervical: No cervical adenopathy.  Skin:    General: Skin is warm and dry.  Neurological:     Mental Status: He is alert. Mental status is at baseline.  Psychiatric:        Mood and Affect: Mood normal.    ASSESSMENT/ PLAN:  TODAY  Major depression recurrent chronic is stable will continue cymbalta 60 mg daily   2. COVID: has been  started on molnupiravir 800 mg twice daily for total of 5 days; has completed doxycycline for his pneumonia is on vitamin protocol   3. Primary hypertension; is stable b/p  136/78 will continue to monitor   4. PAD (Peripheral arterial disease) is stable will continue plavix 75 mg daily    PREVIOUS   5. Aortic atherosclerosis: (ct 01-01-20) will monitor    6. Adenocarcinoma of colon:  has seen Dr. Arnoldo Morale; he is a very poor surgical candidate . Will monitor his status.   6. Cerebrovascular accident (CVA) unspecified mechanism: is stable will continue asa 81 mg daily and plavix 75 mg daily   7. Dementia associated with other underlying disease without behavioral disturbance: is stable weight is 147 pounds will continue to monitor his status.   8. Spinal stenosis lumbar region with neurogenic claudication: is stable will continue cymbalta 60 mg daily has prn tylenol  9. Severe protein calorie malnutrition: is stable weight is 147 pounds albumin 3.2 will monitor will continue supplements as directed   10. Chronic constipation: is table will continue miralax 34 gm twice daily   11. Hyperlipidemia LDL goal <100 is stable will continue crestor 10 mg daily     Ok Edwards NP [facility]  Contact [phone] Monday through Friday 8am- 5pm  After hours call [phone]

## 2020-07-31 DIAGNOSIS — M6281 Muscle weakness (generalized): Secondary | ICD-10-CM | POA: Diagnosis not present

## 2020-07-31 DIAGNOSIS — J9601 Acute respiratory failure with hypoxia: Secondary | ICD-10-CM | POA: Diagnosis not present

## 2020-07-31 DIAGNOSIS — R279 Unspecified lack of coordination: Secondary | ICD-10-CM | POA: Diagnosis not present

## 2020-07-31 DIAGNOSIS — I639 Cerebral infarction, unspecified: Secondary | ICD-10-CM | POA: Diagnosis not present

## 2020-08-04 DIAGNOSIS — R279 Unspecified lack of coordination: Secondary | ICD-10-CM | POA: Diagnosis not present

## 2020-08-04 DIAGNOSIS — U071 COVID-19: Secondary | ICD-10-CM | POA: Insufficient documentation

## 2020-08-04 DIAGNOSIS — J9601 Acute respiratory failure with hypoxia: Secondary | ICD-10-CM | POA: Diagnosis not present

## 2020-08-04 DIAGNOSIS — I639 Cerebral infarction, unspecified: Secondary | ICD-10-CM | POA: Diagnosis not present

## 2020-08-04 DIAGNOSIS — M6281 Muscle weakness (generalized): Secondary | ICD-10-CM | POA: Diagnosis not present

## 2020-08-05 DIAGNOSIS — I639 Cerebral infarction, unspecified: Secondary | ICD-10-CM | POA: Diagnosis not present

## 2020-08-05 DIAGNOSIS — R279 Unspecified lack of coordination: Secondary | ICD-10-CM | POA: Diagnosis not present

## 2020-08-05 DIAGNOSIS — M6281 Muscle weakness (generalized): Secondary | ICD-10-CM | POA: Diagnosis not present

## 2020-08-05 DIAGNOSIS — J9601 Acute respiratory failure with hypoxia: Secondary | ICD-10-CM | POA: Diagnosis not present

## 2020-08-06 DIAGNOSIS — J9601 Acute respiratory failure with hypoxia: Secondary | ICD-10-CM | POA: Diagnosis not present

## 2020-08-06 DIAGNOSIS — M6281 Muscle weakness (generalized): Secondary | ICD-10-CM | POA: Diagnosis not present

## 2020-08-06 DIAGNOSIS — I639 Cerebral infarction, unspecified: Secondary | ICD-10-CM | POA: Diagnosis not present

## 2020-08-06 DIAGNOSIS — R279 Unspecified lack of coordination: Secondary | ICD-10-CM | POA: Diagnosis not present

## 2020-08-10 DIAGNOSIS — M6281 Muscle weakness (generalized): Secondary | ICD-10-CM | POA: Diagnosis not present

## 2020-08-10 DIAGNOSIS — R279 Unspecified lack of coordination: Secondary | ICD-10-CM | POA: Diagnosis not present

## 2020-08-10 DIAGNOSIS — I639 Cerebral infarction, unspecified: Secondary | ICD-10-CM | POA: Diagnosis not present

## 2020-08-10 DIAGNOSIS — J9601 Acute respiratory failure with hypoxia: Secondary | ICD-10-CM | POA: Diagnosis not present

## 2020-08-11 DIAGNOSIS — I639 Cerebral infarction, unspecified: Secondary | ICD-10-CM | POA: Diagnosis not present

## 2020-08-11 DIAGNOSIS — J9601 Acute respiratory failure with hypoxia: Secondary | ICD-10-CM | POA: Diagnosis not present

## 2020-08-11 DIAGNOSIS — M6281 Muscle weakness (generalized): Secondary | ICD-10-CM | POA: Diagnosis not present

## 2020-08-11 DIAGNOSIS — R279 Unspecified lack of coordination: Secondary | ICD-10-CM | POA: Diagnosis not present

## 2020-08-12 DIAGNOSIS — R279 Unspecified lack of coordination: Secondary | ICD-10-CM | POA: Diagnosis not present

## 2020-08-12 DIAGNOSIS — J9601 Acute respiratory failure with hypoxia: Secondary | ICD-10-CM | POA: Diagnosis not present

## 2020-08-12 DIAGNOSIS — I639 Cerebral infarction, unspecified: Secondary | ICD-10-CM | POA: Diagnosis not present

## 2020-08-12 DIAGNOSIS — M6281 Muscle weakness (generalized): Secondary | ICD-10-CM | POA: Diagnosis not present

## 2020-08-13 DIAGNOSIS — I639 Cerebral infarction, unspecified: Secondary | ICD-10-CM | POA: Diagnosis not present

## 2020-08-13 DIAGNOSIS — R279 Unspecified lack of coordination: Secondary | ICD-10-CM | POA: Diagnosis not present

## 2020-08-13 DIAGNOSIS — J9601 Acute respiratory failure with hypoxia: Secondary | ICD-10-CM | POA: Diagnosis not present

## 2020-08-13 DIAGNOSIS — M6281 Muscle weakness (generalized): Secondary | ICD-10-CM | POA: Diagnosis not present

## 2020-08-14 DIAGNOSIS — I639 Cerebral infarction, unspecified: Secondary | ICD-10-CM | POA: Diagnosis not present

## 2020-08-14 DIAGNOSIS — M6281 Muscle weakness (generalized): Secondary | ICD-10-CM | POA: Diagnosis not present

## 2020-08-14 DIAGNOSIS — J9601 Acute respiratory failure with hypoxia: Secondary | ICD-10-CM | POA: Diagnosis not present

## 2020-08-14 DIAGNOSIS — R279 Unspecified lack of coordination: Secondary | ICD-10-CM | POA: Diagnosis not present

## 2020-08-17 DIAGNOSIS — R279 Unspecified lack of coordination: Secondary | ICD-10-CM | POA: Diagnosis not present

## 2020-08-17 DIAGNOSIS — I639 Cerebral infarction, unspecified: Secondary | ICD-10-CM | POA: Diagnosis not present

## 2020-08-17 DIAGNOSIS — J9601 Acute respiratory failure with hypoxia: Secondary | ICD-10-CM | POA: Diagnosis not present

## 2020-08-17 DIAGNOSIS — M6281 Muscle weakness (generalized): Secondary | ICD-10-CM | POA: Diagnosis not present

## 2020-08-19 ENCOUNTER — Non-Acute Institutional Stay (SKILLED_NURSING_FACILITY): Admitting: Internal Medicine

## 2020-08-19 ENCOUNTER — Encounter: Payer: Self-pay | Admitting: Internal Medicine

## 2020-08-19 DIAGNOSIS — I10 Essential (primary) hypertension: Secondary | ICD-10-CM | POA: Diagnosis not present

## 2020-08-19 DIAGNOSIS — F0280 Dementia in other diseases classified elsewhere without behavioral disturbance: Secondary | ICD-10-CM | POA: Diagnosis not present

## 2020-08-19 DIAGNOSIS — C189 Malignant neoplasm of colon, unspecified: Secondary | ICD-10-CM

## 2020-08-19 DIAGNOSIS — D500 Iron deficiency anemia secondary to blood loss (chronic): Secondary | ICD-10-CM | POA: Diagnosis not present

## 2020-08-19 DIAGNOSIS — U071 COVID-19: Secondary | ICD-10-CM

## 2020-08-19 NOTE — Progress Notes (Signed)
   NURSING HOME LOCATION:  [facility] ROOM NUMBER:  26 W  CODE STATUS:  DNR  PCP:  Betty [name] MD  This is a nursing facility follow up visit of chronic medical diagnoses & to document compliance with Regulation 483.30 (c) in [employer] [place] Phase 2 which mandates caregiver visit ( visits can alternate among physician, PA or NP as per statutes) within 10 days of 30 days / 60 days/ 90 days post admission to SNF date    Interim medical record and care since last SNF visit was updated with review of diagnostic studies and change in clinical status since last visit were documented.  HPI: He is a Hospice patient & permanent resident of [facility] with medical diagnoses of active colon cancer,chronic anemia, history of cerebral infarction complicated by dysarthria, dyslipidemia, history of pyothorax, lumbar spinal stenosis with neurogenic claudication,COPD and dementia. He underwent catheter drainage of the pyothorax in December 2021.  Review of systems: Dementia invalidated responses.He dnies any symptoms with special reference to GI ROS.Date given as 1902 yet he named the [place].   Physical exam:  Pertinent or positive findings: He appears poorly nourished and almost cachectic.  Temporal wasting is noted.  Arcus senilis is present.  He has incredibly bad dental hygiene with multiple missing teeth and caries to and below the gumline.  Heart sounds are markedly distant and difficult to auscultate.  Breath sounds are also distant especially on the left.  He has minor rales on the right.  Pedal pulses are decreased.  Despite limb atrophy and interosseous wasting strength to opposition was fair in all extremities.  General appearance:  no acute distress, increased work of breathing is present.   Lymphatic: No lymphadenopathy about the head, neck, axilla. Eyes: No conjunctival inflammation or lid edema is present. There is no scleral icterus. Ears:  External ear exam  shows no significant lesions or deformities.   Nose:  External nasal examination shows no deformity or inflammation. Nasal mucosa are pink and moist without lesions, exudates Neck:  No thyromegaly, masses, tenderness noted.    Heart:  No gallop, murmur, click, rub .  Lungs:  without wheezes, rhonchi,  rubs. Abdomen: Bowel sounds are normal. Abdomen is soft and nontender with no organomegaly, hernias, masses. GU: Deferred  Extremities:  No cyanosis, clubbing, edema  Neurologic exam :Balance, Rhomberg, finger to nose testing could not be completed due to clinical state Skin: Warm & dry w/o tenting. No significant lesions or rash.  See summary under each active problem in the Problem List with associated updated therapeutic plan

## 2020-08-19 NOTE — Assessment & Plan Note (Signed)
Today he named the [place] but gave date as 72.He is oblivious to his cancer diagnosis.

## 2020-08-19 NOTE — Assessment & Plan Note (Signed)
Microcytic ,hypochromic anemia improved despite untreated colon cancer

## 2020-08-19 NOTE — Assessment & Plan Note (Signed)
BPs soft( range 37/09- hi 643C systolic. Rarely above 381 systolic)  w/o antihypertensives

## 2020-08-19 NOTE — Assessment & Plan Note (Signed)
Comfort care clinically most appropriate

## 2020-08-19 NOTE — Patient Instructions (Signed)
See assessment and plan under each diagnosis in the problem list and acutely for this visit 

## 2020-08-20 DIAGNOSIS — F330 Major depressive disorder, recurrent, mild: Secondary | ICD-10-CM | POA: Diagnosis not present

## 2020-08-20 NOTE — Assessment & Plan Note (Signed)
Asymptomatic in reference to Covid

## 2020-08-26 DIAGNOSIS — F330 Major depressive disorder, recurrent, mild: Secondary | ICD-10-CM | POA: Diagnosis not present

## 2020-08-27 ENCOUNTER — Encounter: Payer: Self-pay | Admitting: Adult Health

## 2020-08-27 ENCOUNTER — Non-Acute Institutional Stay (SKILLED_NURSING_FACILITY): Payer: Medicare Other | Admitting: Adult Health

## 2020-08-27 DIAGNOSIS — C189 Malignant neoplasm of colon, unspecified: Secondary | ICD-10-CM

## 2020-08-27 DIAGNOSIS — F0280 Dementia in other diseases classified elsewhere without behavioral disturbance: Secondary | ICD-10-CM | POA: Diagnosis not present

## 2020-08-27 DIAGNOSIS — F339 Major depressive disorder, recurrent, unspecified: Secondary | ICD-10-CM

## 2020-08-27 DIAGNOSIS — E441 Mild protein-calorie malnutrition: Secondary | ICD-10-CM

## 2020-08-27 NOTE — Progress Notes (Signed)
Location:  [place] Room Number: 104-W Place of Service:  SNF (31)   CODE STATUS: DNR  No Known Allergies  Chief Complaint  Patient presents with   Acute Visit    Care plan meeting.    HPI:  We have come together for his care plan meeting. [name] BIMS 5/15, mood 0/30. There have been no falls; he is nonambulatory. He requires extensive assist to dependent with adls. He requires assist with meals. He is frequently incontinent of bladder and bowel. Dietary: weight is 138.6 pounds weight is stable     therapy none at this time. Is followed by hospice care.  He continues to be followed for his chronic illnesses including: Adenocarcinoma of colon Dementia associated with other underlying diseases without behavioral disturbance  Mild protein calorie malnutrition Major depression  recurrent chronic  Past Medical History:  Diagnosis Date   Anemia    Back pain    Cerebral infarction [facility])    Dysarthria following cerebral infarction    Hyperlipemia    Major depressive disorder    Pyothorax (HCC)    Renal disorder    kidney stones   Spinal stenosis, lumbar region, with neurogenic claudication 07/07/2014   Unspecified dementia without behavioral disturbance [facility])     Past Surgical History:  Procedure Laterality Date   BIOPSY  04/13/2020   Procedure: BIOPSY;  Surgeon: Eloise Harman, DO;  [facility];  Service: Endoscopy;;   CATARACT EXTRACTION W/PHACO Left 08/22/2016   Procedure: CATARACT EXTRACTION PHACO AND INTRAOCULAR LENS PLACEMENT ([place]) Left;  Surgeon: [name], MD;  Location: [place];  Service: Ophthalmology;  Laterality: Left;   CATARACT EXTRACTION W/PHACO Right 10/25/2016   Procedure: CATARACT EXTRACTION PHACO AND INTRAOCULAR LENS PLACEMENT ([place]) WITH ISTENT RIGHT  ;  Surgeon: [name], MD;  Location: [place];  Service: Ophthalmology;  Laterality: Right;  TOPICAL RIGHT  ISTENT   trabecular bypass stent  was implanted/explanted.   COLONOSCOPY WITH PROPOFOL N/A 04/13/2020   Procedure: COLONOSCOPY WITH PROPOFOL;  Surgeon: Eloise Harman, DO;  [facility];  Service: Endoscopy;  Laterality: N/A;   FLEXIBLE SIGMOIDOSCOPY  04/11/2020   Procedure: FLEXIBLE SIGMOIDOSCOPY;  Surgeon: Harvel Quale, MD;  [facility];  Service: Gastroenterology;;   Beryle Quant  04/12/2020   Procedure: Beryle Quant;  Surgeon: Montez Morita, Quillian Quince, MD;  [facility];  Service: Gastroenterology;;   INGUINAL HERNIA REPAIR     IR PERC PLEURAL DRAIN W/INDWELL CATH W/IMG GUIDE  01/03/2020   KNEE SURGERY Right    NECK SURGERY      Social History   Socioeconomic History   Marital status: Widowed    Spouse name: Not on file   Number of children: Not on file   Years of education: Not on file   Highest education level: Not on file  Occupational History   Not on file  Tobacco Use   Smoking status: Every Day    Packs/day: 0.75    Types: Cigarettes   Smokeless tobacco: Never  Vaping Use   Vaping Use: Never used  Substance and Sexual Activity   Alcohol use: No   Drug use: Never   Sexual activity: Not on file  Other Topics Concern   Not on file  Social History Narrative   Not on file   Social Determinants of Health   Financial Resource Strain: Not on file  Food Insecurity: Not on file  Transportation Needs:  Not on file  Physical Activity: Not on file  Stress: Not on file  Social Connections: Not on file  Intimate Partner Violence: Not on file   Family History  Problem Relation Age of Onset   Diabetes Mother    Diabetes Brother    Diabetes Sister       VITAL SIGNS BP 118/72   Pulse 73   Temp (!) 97 F (36.1 C)   Resp 20   Ht '5\' 5"'$  (1.651 m)   Wt 147 lb 9.6 oz (67 kg)   SpO2 94%   BMI 24.56 kg/m   Outpatient Encounter Medications as of 08/27/2020  Medication Sig   acetaminophen (TYLENOL) 325 MG tablet Take 2 tablets (650  mg total) by mouth every 6 (six) hours as needed for mild pain, fever or headache.   ARTIFICIAL TEARS 0.2-0.2-1 % SOLN Place 2 drops into both eyes in the morning and at bedtime.    aspirin EC 81 MG tablet Take 81 mg by mouth daily. Swallow whole.   Balsam [place]-Castor Oil (VENELEX) OINT Apply 1 application topically as directed. Apply to sacrum, coccyx and bilateral buttocks qshift   DULoxetine (CYMBALTA) 60 MG capsule Take 1 capsule (60 mg total) by mouth daily.   Multiple Vitamins-Minerals (SENIOR MULTIVITAMIN PLUS PO) Take 1 tablet by mouth daily in the afternoon. 500-300-250 mcg   NON FORMULARY Dist NAS   polyethylene glycol (MIRALAX / GLYCOLAX) 17 g packet Take 34 g by mouth 2 (two) times daily as needed (Mix with at least 4 ounces of liquid and drink).   rosuvastatin (CRESTOR) 10 MG tablet Take 1 tablet (10 mg total) by mouth daily.   No facility-administered encounter medications on file as of 08/27/2020.     SIGNIFICANT DIAGNOSTIC EXAMS   PREVIOUS    01-01-20; ct of chest:  1. Moderate-size loculated right pleural effusion, which may reflect an empyema. Most of the right lower lobe is opacified consistent with pneumonia, atelectasis or a combination. 2. Additional mild dependent linear/reticular atelectasis in the right upper and middle lobes and left lower lobe. Trace left pleural effusion. 3. No visualized centrally obstructing mass. 4. Emphysema and mild aortic atherosclerosis. Aortic Atherosclerosis and Emphysema   03-02-20: ct of head;  1. No acute intracranial abnormality. 2. Expected evolution of right corona radiata lacunar infarct from prior exam. Remote lacunar infarct in the right thalamus. 3. Stable atrophy and chronic small vessel ischemia.  04-09-20: ct of abdomen and pelvis:  1. Mild wall thickening of the rectum, may represent proctitis. No other explanation for GI bleed. No significant diverticular disease. 2. Consolidative opacity in the dependent right lower  lobe with small right pleural effusion. Findings may represent pneumonia or aspiration. Minimal patchy left basilar opacity. 3. Equivocal pre pyloric gastric wall thickening, can be seen with gastritis. 4. Cholelithiasis. Limited assessment for gallbladder inflammation given motion. 5. Bilateral renal scarring, right greater than left. 6. Enlarged prostate gland. Wall thickening at the bladder base may be related to chronic bladder outlet obstruction. Small bladder diverticula. 7. Bilateral femoral head avascular necrosis without subchondral collapse. Aortic Atherosclerosis   04-13-20: colonoscopy  - Diverticulosis in the sigmoid colon, in the descending colon and in the transverse colon. - Rule out malignancy, partially obstructing tumor in the ascending colon. Biopsied. - Blood in the entire examined colon.  07-23-20: chest x-ray:  There is right lower lobe infiltrates seen.   07-28-20 Chest x-ray:  Mild patchy bibasilar densities right greater than left compatible with pneumonia. In  comparison to prior exam; findings are mild worsened with new left basilar infiltrates Mild cardiomegaly Mild osteopenia Mild osteoarthritis   NO NEW EXAMS.    LABS REVIEWED PREVIOUS   01-01-20: wbc 15.9; hgb 11.5; hct 37.3; mcv 88.3 plt 319; glucose 119; bun 19; creat 0.87; k+ 4.2; na++ 136; ca 8.3 GFR>60 01-26-20: wbc 10.4; hgb 10.9; hct 33.6; mcv 81.4 plt 273; glucose 83; bun 18; creat 1.00; k+ 4.6; na++ 136; ca 8.6 GFR>60  02-08-20: wbc 10.4; hgb 12.7; hct 39.9; mcv 81.9 plt 250; glucose 107; bun 20; creat 0.95; k+ 4.6 na++ 136; ca 8.6; GFR>60 04-09-20: wbc 10.9; hgb 11.8; hct 40.0; mcv 87.0 plt 194; glucose 99; bun 32; creat 1.03; k+ 4.8; na++ 141; ca 8.8 GFR>60; liver normal albumin 3.2 mag 1.6 04-11-20: wbc 7.3; hgb 9.5; hct 31.9; mcv 86.2 plt 178; glucose 82; bun 15; creat 1.05; k+ 4.0; na++ 139; ca 8.4 GFR>60 mag 1.8 04/15/2020: hgb 10.0; hct 33.4  04-20-20: hgb 9.7; hct 32.5; glucose 100; bun 22; creat  1.16; k+ 4.0; na++ 139; ca 8.6 GFR>60 05-18-20: CEA 3.9 07-28-20: wbc 12.5; hgb 12.5; hct 41.9; mcv 78.9 plt 238; glucose 106; bun 23; creat 1.22; k+ 4.2; na++ 137; ca 8.7 GFR >60; d-dimer: 0.49; CRP 1.6   NO NEW LABS.   Review of Systems  Constitutional:  Negative for malaise/fatigue.  Respiratory:  Negative for cough and shortness of breath.   Cardiovascular:  Negative for chest pain.  Gastrointestinal:  Negative for abdominal pain.  Musculoskeletal:  Negative for myalgias.  Skin: Negative.   Psychiatric/Behavioral:  The patient is not nervous/anxious.    Physical Exam Constitutional:      General: He is not in acute distress.    Appearance: He is well-developed. He is not diaphoretic.  Neck:     Thyroid: No thyromegaly.  Cardiovascular:     Rate and Rhythm: Normal rate and regular rhythm.     Pulses: Normal pulses.     Heart sounds: Normal heart sounds.  Pulmonary:     Effort: Pulmonary effort is normal. No respiratory distress.     Breath sounds: Normal breath sounds.  Abdominal:     General: Bowel sounds are normal. There is no distension.     Palpations: Abdomen is soft.     Tenderness: There is no abdominal tenderness.  Musculoskeletal:     Cervical back: Neck supple.     Right lower leg: No edema.     Left lower leg: No edema.     Comments:  Is able to move all extremities Right hand contracted           Lymphadenopathy:     Cervical: No cervical adenopathy.  Skin:    General: Skin is warm and dry.  Neurological:     Mental Status: He is alert. Mental status is at baseline.  Psychiatric:        Mood and Affect: Mood normal.     ASSESSMENT/ PLAN:  TODAY  Adenocarcinoma of colon Dementia associated with other underlying diseases without behavioral disturbance  Mild protein calorie malnutrition Major depression  recurrent chronic   Will continue current mediations Will continue current plan of care Will monitor his status He is followed by hospice care.    Time spent with patient: 40 minutes: hospice care; medications; goals of care; overall health status.    Ok Edwards NP [facility]  Contact [phone] Monday through Friday 8am- 5pm  After hours call [phone]

## 2020-09-16 DIAGNOSIS — Z23 Encounter for immunization: Secondary | ICD-10-CM | POA: Diagnosis not present

## 2020-09-17 DIAGNOSIS — F330 Major depressive disorder, recurrent, mild: Secondary | ICD-10-CM | POA: Diagnosis not present

## 2020-09-28 ENCOUNTER — Non-Acute Institutional Stay (SKILLED_NURSING_FACILITY): Payer: Medicare Other | Admitting: Adult Health

## 2020-09-28 ENCOUNTER — Encounter: Payer: Self-pay | Admitting: Adult Health

## 2020-09-28 DIAGNOSIS — F0280 Dementia in other diseases classified elsewhere without behavioral disturbance: Secondary | ICD-10-CM | POA: Diagnosis not present

## 2020-09-28 DIAGNOSIS — C189 Malignant neoplasm of colon, unspecified: Secondary | ICD-10-CM | POA: Diagnosis not present

## 2020-09-28 DIAGNOSIS — R627 Adult failure to thrive: Secondary | ICD-10-CM | POA: Diagnosis not present

## 2020-09-28 DIAGNOSIS — I700 Atherosclerosis of aorta: Secondary | ICD-10-CM

## 2020-09-28 DIAGNOSIS — I639 Cerebral infarction, unspecified: Secondary | ICD-10-CM | POA: Diagnosis not present

## 2020-09-28 NOTE — Progress Notes (Signed)
Location:  [place] Room Number: 104-W Place of Service:  SNF (31)   CODE STATUS: DNR  No Known Allergies  Chief Complaint  Patient presents with   Medical Management of Chronic Issues        Aortic atherosclerosis    Adenocarcinoma of colon:   Cerebrovascular accident (CVA) unspecified mechanism:  Dementia associated with other underlying disease without behavioral disturbance/failure to thrive in adult     HPI:  He is a 80 year old long term resident of this facility being seen for the management of his chronic illnesses: Aortic atherosclerosis    Adenocarcinoma of colon:   Cerebrovascular accident (CVA) unspecified mechanism:  Dementia associated with other underlying disease without behavioral disturbance. He continues to be followed by hospice care. There are no reports of uncontrolled pain. He continues to lose weight.   Past Medical History:  Diagnosis Date   Anemia    Back pain    Cerebral infarction [facility])    Dysarthria following cerebral infarction    Hyperlipemia    Major depressive disorder    Pyothorax (HCC)    Renal disorder    kidney stones   Spinal stenosis, lumbar region, with neurogenic claudication 07/07/2014   Unspecified dementia without behavioral disturbance [facility])     Past Surgical History:  Procedure Laterality Date   BIOPSY  04/13/2020   Procedure: BIOPSY;  Surgeon: Eloise Harman, DO;  [facility];  Service: Endoscopy;;   CATARACT EXTRACTION W/PHACO Left 08/22/2016   Procedure: CATARACT EXTRACTION PHACO AND INTRAOCULAR LENS PLACEMENT ([place]) Left;  Surgeon: [name], MD;  Location: [place];  Service: Ophthalmology;  Laterality: Left;   CATARACT EXTRACTION W/PHACO Right 10/25/2016   Procedure: CATARACT EXTRACTION PHACO AND INTRAOCULAR LENS PLACEMENT ([place]) WITH ISTENT RIGHT  ;  Surgeon: [name], MD;  Location: [place];  Service: Ophthalmology;  Laterality: Right;   TOPICAL RIGHT  ISTENT   trabecular bypass stent was implanted/explanted.   COLONOSCOPY WITH PROPOFOL N/A 04/13/2020   Procedure: COLONOSCOPY WITH PROPOFOL;  Surgeon: Eloise Harman, DO;  [facility];  Service: Endoscopy;  Laterality: N/A;   FLEXIBLE SIGMOIDOSCOPY  04/11/2020   Procedure: FLEXIBLE SIGMOIDOSCOPY;  Surgeon: Harvel Quale, MD;  [facility];  Service: Gastroenterology;;   Beryle Quant  04/12/2020   Procedure: Beryle Quant;  Surgeon: Montez Morita, Quillian Quince, MD;  [facility];  Service: Gastroenterology;;   INGUINAL HERNIA REPAIR     IR PERC PLEURAL DRAIN W/INDWELL CATH W/IMG GUIDE  01/03/2020   KNEE SURGERY Right    NECK SURGERY      Social History   Socioeconomic History   Marital status: Widowed    Spouse name: Not on file   Number of children: Not on file   Years of education: Not on file   Highest education level: Not on file  Occupational History   Not on file  Tobacco Use   Smoking status: Every Day    Packs/day: 0.75    Types: Cigarettes   Smokeless tobacco: Never  Vaping Use   Vaping Use: Never used  Substance and Sexual Activity   Alcohol use: No   Drug use: Never   Sexual activity: Not on file  Other Topics Concern   Not on file  Social History Narrative   Not on file   Social Determinants of Health   Financial Resource Strain: Not on file  Food Insecurity: Not on file  Transportation Needs: Not on file  Physical Activity: Not on file  Stress: Not on file  Social Connections: Not on file  Intimate Partner Violence: Not on file   Family History  Problem Relation Age of Onset   Diabetes Mother    Diabetes Brother    Diabetes Sister       VITAL SIGNS BP 136/71   Pulse 69   Temp 97.8 F (36.6 C)   Resp 20   Ht '5\' 5"'$  (1.651 m)   Wt 140 lb 12.8 oz (63.9 kg)   SpO2 94%   BMI 23.43 kg/m   Outpatient Encounter Medications as of 09/28/2020  Medication Sig    acetaminophen (TYLENOL) 325 MG tablet Take 2 tablets (650 mg total) by mouth every 6 (six) hours as needed for mild pain, fever or headache.   ARTIFICIAL TEARS 0.2-0.2-1 % SOLN Place 2 drops into both eyes in the morning and at bedtime.    aspirin EC 81 MG tablet Take 81 mg by mouth daily. Swallow whole.   Balsam [place]-Castor Oil (VENELEX) OINT Apply 1 application topically as directed. Apply to sacrum, coccyx and bilateral buttocks qshift   DULoxetine (CYMBALTA) 60 MG capsule Take 1 capsule (60 mg total) by mouth daily.   Multiple Vitamins-Minerals (SENIOR MULTIVITAMIN PLUS PO) Take 1 tablet by mouth daily in the afternoon. 500-300-250 mcg   NON FORMULARY Dist NAS   polyethylene glycol (MIRALAX / GLYCOLAX) 17 g packet Take 34 g by mouth 2 (two) times daily as needed (Mix with at least 4 ounces of liquid and drink).   rosuvastatin (CRESTOR) 10 MG tablet Take 1 tablet (10 mg total) by mouth daily.   No facility-administered encounter medications on file as of 09/28/2020.     SIGNIFICANT DIAGNOSTIC EXAMS   PREVIOUS    01-01-20; ct of chest:  1. Moderate-size loculated right pleural effusion, which may reflect an empyema. Most of the right lower lobe is opacified consistent with pneumonia, atelectasis or a combination. 2. Additional mild dependent linear/reticular atelectasis in the right upper and middle lobes and left lower lobe. Trace left pleural effusion. 3. No visualized centrally obstructing mass. 4. Emphysema and mild aortic atherosclerosis. Aortic Atherosclerosis and Emphysema   03-02-20: ct of head;  1. No acute intracranial abnormality. 2. Expected evolution of right corona radiata lacunar infarct from prior exam. Remote lacunar infarct in the right thalamus. 3. Stable atrophy and chronic small vessel ischemia.  04-09-20: ct of abdomen and pelvis:  1. Mild wall thickening of the rectum, may represent proctitis. No other explanation for GI bleed. No significant diverticular  disease. 2. Consolidative opacity in the dependent right lower lobe with small right pleural effusion. Findings may represent pneumonia or aspiration. Minimal patchy left basilar opacity. 3. Equivocal pre pyloric gastric wall thickening, can be seen with gastritis. 4. Cholelithiasis. Limited assessment for gallbladder inflammation given motion. 5. Bilateral renal scarring, right greater than left. 6. Enlarged prostate gland. Wall thickening at the bladder base may be related to chronic bladder outlet obstruction. Small bladder diverticula. 7. Bilateral femoral head avascular necrosis without subchondral collapse. Aortic Atherosclerosis   04-13-20: colonoscopy  - Diverticulosis in the sigmoid colon, in the descending colon and in the transverse colon. - Rule out malignancy, partially obstructing tumor in the ascending colon. Biopsied. - Blood in the entire examined colon.  07-23-20: chest x-ray:  There is right lower lobe infiltrates seen.   07-28-20 Chest x-ray:  Mild patchy bibasilar densities right greater than left compatible with pneumonia.  In comparison to prior exam; findings are mild worsened with new left basilar infiltrates Mild cardiomegaly Mild osteopenia Mild osteoarthritis   NO NEW EXAMS.    LABS REVIEWED PREVIOUS   01-01-20: wbc 15.9; hgb 11.5; hct 37.3; mcv 88.3 plt 319; glucose 119; bun 19; creat 0.87; k+ 4.2; na++ 136; ca 8.3 GFR>60 01-26-20: wbc 10.4; hgb 10.9; hct 33.6; mcv 81.4 plt 273; glucose 83; bun 18; creat 1.00; k+ 4.6; na++ 136; ca 8.6 GFR>60  02-08-20: wbc 10.4; hgb 12.7; hct 39.9; mcv 81.9 plt 250; glucose 107; bun 20; creat 0.95; k+ 4.6 na++ 136; ca 8.6; GFR>60 04-09-20: wbc 10.9; hgb 11.8; hct 40.0; mcv 87.0 plt 194; glucose 99; bun 32; creat 1.03; k+ 4.8; na++ 141; ca 8.8 GFR>60; liver normal albumin 3.2 mag 1.6 04-11-20: wbc 7.3; hgb 9.5; hct 31.9; mcv 86.2 plt 178; glucose 82; bun 15; creat 1.05; k+ 4.0; na++ 139; ca 8.4 GFR>60 mag 1.8 04/10/2020: hgb 10.0; hct  33.4  04-20-20: hgb 9.7; hct 32.5; glucose 100; bun 22; creat 1.16; k+ 4.0; na++ 139; ca 8.6 GFR>60 05-18-20: CEA 3.9 07-28-20: wbc 12.5; hgb 12.5; hct 41.9; mcv 78.9 plt 238; glucose 106; bun 23; creat 1.22; k+ 4.2; na++ 137; ca 8.7 GFR >60; d-dimer: 0.49; CRP 1.6    NO NEW LABS.   Review of Systems  Constitutional:  Negative for malaise/fatigue.  Respiratory:  Negative for cough and shortness of breath.   Cardiovascular:  Negative for chest pain and leg swelling.  Gastrointestinal:  Negative for abdominal pain.  Musculoskeletal:  Negative for back pain, joint pain and myalgias.  Skin: Negative.   Neurological:  Negative for dizziness.  Psychiatric/Behavioral:  The patient is not nervous/anxious.     Physical Exam Constitutional:      General: He is not in acute distress.    Appearance: He is well-developed. He is not diaphoretic.  Neck:     Thyroid: No thyromegaly.  Cardiovascular:     Rate and Rhythm: Normal rate and regular rhythm.     Pulses: Normal pulses.     Heart sounds: Normal heart sounds.  Pulmonary:     Effort: Pulmonary effort is normal. No respiratory distress.     Breath sounds: Normal breath sounds.  Abdominal:     General: Bowel sounds are normal. There is no distension.     Palpations: Abdomen is soft.     Tenderness: There is no abdominal tenderness.  Musculoskeletal:     Cervical back: Neck supple.     Right lower leg: No edema.     Left lower leg: No edema.     Comments:  Is able to move all extremities Right hand contracted            Lymphadenopathy:     Cervical: No cervical adenopathy.  Skin:    General: Skin is warm and dry.  Neurological:     Mental Status: He is alert. Mental status is at baseline.  Psychiatric:        Mood and Affect: Mood normal.    ASSESSMENT/ PLAN:  TODAY  Aortic atherosclerosis (ct 01-01-20) will monitor   2. Adenocarcinoma of colon: is not a surgical candidate is followed by hospice care  3. Cerebrovascular  accident (CVA) unspecified mechanism: is stable asa 81 mg daily and plavix 75 mg daily   4. Dementia associated with other underlying disease without behavioral disturbance/failure th thrive in adult: is without change weight is 140 pounds will continue to monitor his status.  PREVIOUS   5. Spinal stenosis lumbar region with neurogenic claudication: is stable will continue cymbalta 60 mg daily has prn tylenol  6. Severe protein calorie malnutrition: is stable weight is 140 pounds albumin 3.2 will monitor will continue supplements as directed   7. Chronic constipation: is table will continue miralax 34 gm twice daily as needed    8. Hyperlipidemia LDL goal <100 is stable will continue crestor 10 mg daily   9. Major depression recurrent chronic is stable will continue cymbalta 60 mg daily   10. Primary hypertension; is stable b/p  136/71 will continue to monitor   11. PAD (Peripheral arterial disease) is stable will continue plavix 75 mg daily       Ok Edwards NP [facility]  Contact [phone] Monday through Friday 8am- 5pm  After hours call [phone]

## 2020-09-30 DIAGNOSIS — R627 Adult failure to thrive: Secondary | ICD-10-CM | POA: Insufficient documentation

## 2020-10-01 DIAGNOSIS — F330 Major depressive disorder, recurrent, mild: Secondary | ICD-10-CM | POA: Diagnosis not present

## 2020-10-15 DIAGNOSIS — F330 Major depressive disorder, recurrent, mild: Secondary | ICD-10-CM | POA: Diagnosis not present

## 2020-10-26 DIAGNOSIS — F331 Major depressive disorder, recurrent, moderate: Secondary | ICD-10-CM | POA: Diagnosis not present

## 2020-10-26 DIAGNOSIS — G311 Senile degeneration of brain, not elsewhere classified: Secondary | ICD-10-CM | POA: Diagnosis not present

## 2020-10-28 ENCOUNTER — Encounter: Payer: Self-pay | Admitting: Adult Health

## 2020-10-28 ENCOUNTER — Non-Acute Institutional Stay (SKILLED_NURSING_FACILITY): Admitting: Adult Health

## 2020-10-28 DIAGNOSIS — M48062 Spinal stenosis, lumbar region with neurogenic claudication: Secondary | ICD-10-CM | POA: Diagnosis not present

## 2020-10-28 DIAGNOSIS — C189 Malignant neoplasm of colon, unspecified: Secondary | ICD-10-CM | POA: Diagnosis not present

## 2020-10-28 DIAGNOSIS — E440 Moderate protein-calorie malnutrition: Secondary | ICD-10-CM | POA: Diagnosis not present

## 2020-10-28 NOTE — Progress Notes (Signed)
Location:  [place] Room Number: 104-W Place of Service:  SNF (31)   CODE STATUS: DNR  No Known Allergies  Chief Complaint  Patient presents with   Medical Management of Chronic Issues                     Spinal stenosis lumbar region with neurogenic claudication:   Severe protein calorie malnutrition:  Adenocarcinoma of colon:    HPI:  He is a 80 year old long term resident of this facility being seen for the management of his chronic illnesses: Spinal stenosis lumbar region with neurogenic claudication:   Severe protein calorie malnutrition:  Adenocarcinoma of colon:. He is having some bleeding from his rectum at times. He is having throbbing pain after he eats. His po intake is worsening. There are no reports of fevers present.   Past Medical History:  Diagnosis Date   Anemia    Back pain    Cerebral infarction [facility])    Dysarthria following cerebral infarction    Hyperlipemia    Major depressive disorder    Pyothorax (HCC)    Renal disorder    kidney stones   Spinal stenosis, lumbar region, with neurogenic claudication 07/07/2014   Unspecified dementia without behavioral disturbance [facility])     Past Surgical History:  Procedure Laterality Date   BIOPSY  04/13/2020   Procedure: BIOPSY;  Surgeon: Eloise Harman, DO;  [facility];  Service: Endoscopy;;   CATARACT EXTRACTION W/PHACO Left 08/22/2016   Procedure: CATARACT EXTRACTION PHACO AND INTRAOCULAR LENS PLACEMENT ([place]) Left;  Surgeon: [name], MD;  Location: [place];  Service: Ophthalmology;  Laterality: Left;   CATARACT EXTRACTION W/PHACO Right 10/25/2016   Procedure: CATARACT EXTRACTION PHACO AND INTRAOCULAR LENS PLACEMENT ([place]) WITH ISTENT RIGHT  ;  Surgeon: [name], MD;  Location: [place];  Service: Ophthalmology;  Laterality: Right;  TOPICAL RIGHT  ISTENT   trabecular bypass stent was implanted/explanted.   COLONOSCOPY WITH PROPOFOL  N/A 04/13/2020   Procedure: COLONOSCOPY WITH PROPOFOL;  Surgeon: Eloise Harman, DO;  [facility];  Service: Endoscopy;  Laterality: N/A;   FLEXIBLE SIGMOIDOSCOPY  04/11/2020   Procedure: FLEXIBLE SIGMOIDOSCOPY;  Surgeon: Harvel Quale, MD;  [facility];  Service: Gastroenterology;;   Beryle Quant  04/12/2020   Procedure: Beryle Quant;  Surgeon: Montez Morita, Quillian Quince, MD;  [facility];  Service: Gastroenterology;;   INGUINAL HERNIA REPAIR     IR PERC PLEURAL DRAIN W/INDWELL CATH W/IMG GUIDE  01/03/2020   KNEE SURGERY Right    NECK SURGERY      Social History   Socioeconomic History   Marital status: Widowed    Spouse name: Not on file   Number of children: Not on file   Years of education: Not on file   Highest education level: Not on file  Occupational History   Not on file  Tobacco Use   Smoking status: Every Day    Packs/day: 0.75    Types: Cigarettes   Smokeless tobacco: Never  Vaping Use   Vaping Use: Never used  Substance and Sexual Activity   Alcohol use: No   Drug use: Never   Sexual activity: Not on file  Other Topics Concern   Not on file  Social History Narrative   Not on file   Social Determinants of Health   Financial Resource Strain: Not on file  Food Insecurity: Not  on file  Transportation Needs: Not on file  Physical Activity: Not on file  Stress: Not on file  Social Connections: Not on file  Intimate Partner Violence: Not on file   Family History  Problem Relation Age of Onset   Diabetes Mother    Diabetes Brother    Diabetes Sister       VITAL SIGNS BP 102/60   Pulse 92   Temp 97.8 F (36.6 C)   Resp 20   Ht 5\' 5"  (1.651 m)   Wt 143 lb 6.4 oz (65 kg)   SpO2 94%   BMI 23.86 kg/m   Outpatient Encounter Medications as of 10/28/2020  Medication Sig   acetaminophen (TYLENOL) 325 MG tablet Take 2 tablets (650 mg total) by mouth every 6 (six) hours as needed for mild  pain, fever or headache.   ARTIFICIAL TEARS 0.2-0.2-1 % SOLN Place 2 drops into both eyes in the morning and at bedtime.    aspirin EC 81 MG tablet Take 81 mg by mouth daily. Swallow whole.   Balsam [place]-Castor Oil (VENELEX) OINT Apply 1 application topically as directed. Apply to sacrum, coccyx and bilateral buttocks qshift   DULoxetine (CYMBALTA) 60 MG capsule Take 1 capsule (60 mg total) by mouth daily.   Morphine Sulfate (MORPHINE CONCENTRATE) 10 mg / 0.5 ml concentrated solution Take 5 mg by mouth every 2 (two) hours as needed for severe pain (respiratory distress).   Multiple Vitamins-Minerals (SENIOR MULTIVITAMIN PLUS PO) Take 1 tablet by mouth daily in the afternoon. 500-300-250 mcg   NON FORMULARY Diet- Regular   Nutritional Supplements (ENSURE ENLIVE PO) Take 1 Can by mouth in the morning, at noon, and at bedtime. For poor meal intake and weight loss   polyethylene glycol (MIRALAX / GLYCOLAX) 17 g packet Take 34 g by mouth 2 (two) times daily as needed (Mix with at least 4 ounces of liquid and drink).   rosuvastatin (CRESTOR) 10 MG tablet Take 1 tablet (10 mg total) by mouth daily.   No facility-administered encounter medications on file as of 10/28/2020.     SIGNIFICANT DIAGNOSTIC EXAMS   PREVIOUS    01-01-20; ct of chest:  1. Moderate-size loculated right pleural effusion, which may reflect an empyema. Most of the right lower lobe is opacified consistent with pneumonia, atelectasis or a combination. 2. Additional mild dependent linear/reticular atelectasis in the right upper and middle lobes and left lower lobe. Trace left pleural effusion. 3. No visualized centrally obstructing mass. 4. Emphysema and mild aortic atherosclerosis. Aortic Atherosclerosis and Emphysema   03-02-20: ct of head;  1. No acute intracranial abnormality. 2. Expected evolution of right corona radiata lacunar infarct from prior exam. Remote lacunar infarct in the right thalamus. 3. Stable atrophy and  chronic small vessel ischemia.  04-09-20: ct of abdomen and pelvis:  1. Mild wall thickening of the rectum, may represent proctitis. No other explanation for GI bleed. No significant diverticular disease. 2. Consolidative opacity in the dependent right lower lobe with small right pleural effusion. Findings may represent pneumonia or aspiration. Minimal patchy left basilar opacity. 3. Equivocal pre pyloric gastric wall thickening, can be seen with gastritis. 4. Cholelithiasis. Limited assessment for gallbladder inflammation given motion. 5. Bilateral renal scarring, right greater than left. 6. Enlarged prostate gland. Wall thickening at the bladder base may be related to chronic bladder outlet obstruction. Small bladder diverticula. 7. Bilateral femoral head avascular necrosis without subchondral collapse. Aortic Atherosclerosis   04-13-20: colonoscopy  - Diverticulosis in the  sigmoid colon, in the descending colon and in the transverse colon. - Rule out malignancy, partially obstructing tumor in the ascending colon. Biopsied. - Blood in the entire examined colon.  07-23-20: chest x-ray:  There is right lower lobe infiltrates seen.   07-28-20 Chest x-ray:  Mild patchy bibasilar densities right greater than left compatible with pneumonia. In comparison to prior exam; findings are mild worsened with new left basilar infiltrates Mild cardiomegaly Mild osteopenia Mild osteoarthritis   NO NEW EXAMS.    LABS REVIEWED PREVIOUS   01-01-20: wbc 15.9; hgb 11.5; hct 37.3; mcv 88.3 plt 319; glucose 119; bun 19; creat 0.87; k+ 4.2; na++ 136; ca 8.3 GFR>60 01-26-20: wbc 10.4; hgb 10.9; hct 33.6; mcv 81.4 plt 273; glucose 83; bun 18; creat 1.00; k+ 4.6; na++ 136; ca 8.6 GFR>60  02-08-20: wbc 10.4; hgb 12.7; hct 39.9; mcv 81.9 plt 250; glucose 107; bun 20; creat 0.95; k+ 4.6 na++ 136; ca 8.6; GFR>60 04-09-20: wbc 10.9; hgb 11.8; hct 40.0; mcv 87.0 plt 194; glucose 99; bun 32; creat 1.03; k+ 4.8; na++ 141;  ca 8.8 GFR>60; liver normal albumin 3.2 mag 1.6 04-11-20: wbc 7.3; hgb 9.5; hct 31.9; mcv 86.2 plt 178; glucose 82; bun 15; creat 1.05; k+ 4.0; na++ 139; ca 8.4 GFR>60 mag 1.8 04/24/2020: hgb 10.0; hct 33.4  04-20-20: hgb 9.7; hct 32.5; glucose 100; bun 22; creat 1.16; k+ 4.0; na++ 139; ca 8.6 GFR>60 05-18-20: CEA 3.9 07-28-20: wbc 12.5; hgb 12.5; hct 41.9; mcv 78.9 plt 238; glucose 106; bun 23; creat 1.22; k+ 4.2; na++ 137; ca 8.7 GFR >60; d-dimer: 0.49; CRP 1.6    NO NEW LABS.   Review of Systems  Constitutional:  Negative for malaise/fatigue.  Respiratory:  Negative for cough and shortness of breath.   Cardiovascular:  Negative for chest pain, palpitations and leg swelling.  Gastrointestinal:  Positive for abdominal pain. Negative for constipation, diarrhea and heartburn.  Musculoskeletal:  Negative for back pain, joint pain and myalgias.  Skin: Negative.   Neurological:  Negative for dizziness.  Psychiatric/Behavioral:  The patient is not nervous/anxious.     Physical Exam Constitutional:      General: He is not in acute distress.    Appearance: He is well-developed. He is not diaphoretic.  Neck:     Thyroid: No thyromegaly.  Cardiovascular:     Rate and Rhythm: Normal rate and regular rhythm.     Pulses: Normal pulses.     Heart sounds: Normal heart sounds.  Pulmonary:     Effort: Pulmonary effort is normal. No respiratory distress.     Breath sounds: Normal breath sounds.  Abdominal:     General: Bowel sounds are normal. There is no distension.     Palpations: Abdomen is soft.     Tenderness: There is abdominal tenderness.  Musculoskeletal:     Cervical back: Neck supple.     Right lower leg: No edema.     Left lower leg: No edema.     Comments: Is able to move all extremities  Right hand contracture   Lymphadenopathy:     Cervical: No cervical adenopathy.  Skin:    General: Skin is warm and dry.  Neurological:     Mental Status: He is alert. Mental status is at  baseline.  Psychiatric:        Mood and Affect: Mood normal.    .    ASSESSMENT/ PLAN:  TODAY  Spinal stenosis lumbar region with neurogenic claudication: is stable  will continue cymbalta 60 mg daily   2. Severe protein calorie malnutrition: is stable weight is 143 pounds; albumin 3.2 will continue supplements as directed   3. Adenocarcinoma of colon: he is not a surgical candidate due to his increased abdominal pain after meals will begin roxanol 5 mg with meals and 2 hours as needed   PREVIOUS   4. Chronic constipation: is table will continue miralax 34 gm twice daily as needed    5. Hyperlipidemia LDL goal <100 is stable will stop crestor   6. Major depression recurrent chronic is stable will continue cymbalta 60 mg daily   7. Primary hypertension; is stable b/p  102/60  will continue to monitor   8. PAD (Peripheral arterial disease) is stable will continue plavix 75 mg daily   9. Aortic atherosclerosis (ct 01-01-20) will monitor   10 Cerebrovascular accident (CVA) unspecified mechanism: is stable   11. Dementia associated with other underlying disease without behavioral disturbance/failure th thrive in adult: is without change weight is 143 pounds will continue to monitor his status.     Ok Edwards NP [facility]  Contact [phone] Monday through Friday 8am- 5pm  After hours call [phone]

## 2020-10-29 ENCOUNTER — Other Ambulatory Visit: Payer: Self-pay | Admitting: Adult Health

## 2020-10-29 DIAGNOSIS — F330 Major depressive disorder, recurrent, mild: Secondary | ICD-10-CM | POA: Diagnosis not present

## 2020-10-29 MED ORDER — MORPHINE SULFATE (CONCENTRATE) 10 MG /0.5 ML PO SOLN: 5.0000 mg | ORAL | 0 refills | Status: DC | PRN

## 2020-11-04 DIAGNOSIS — Z23 Encounter for immunization: Secondary | ICD-10-CM | POA: Diagnosis not present

## 2020-11-18 ENCOUNTER — Other Ambulatory Visit: Payer: Self-pay | Admitting: Family Medicine

## 2020-11-18 DIAGNOSIS — M159 Polyosteoarthritis, unspecified: Secondary | ICD-10-CM

## 2020-11-25 ENCOUNTER — Non-Acute Institutional Stay (SKILLED_NURSING_FACILITY): Payer: Medicare Other | Admitting: Adult Health

## 2020-11-25 ENCOUNTER — Encounter: Payer: Self-pay | Admitting: Adult Health

## 2020-11-25 DIAGNOSIS — K5909 Other constipation: Secondary | ICD-10-CM

## 2020-11-25 DIAGNOSIS — I10 Essential (primary) hypertension: Secondary | ICD-10-CM | POA: Diagnosis not present

## 2020-11-25 DIAGNOSIS — E785 Hyperlipidemia, unspecified: Secondary | ICD-10-CM

## 2020-11-25 DIAGNOSIS — F339 Major depressive disorder, recurrent, unspecified: Secondary | ICD-10-CM | POA: Diagnosis not present

## 2020-11-25 NOTE — Progress Notes (Signed)
Location:  [place] Room Number: 152-W Place of Service:  SNF (31)   CODE STATUS: DNR  No Known Allergies  Chief Complaint  Patient presents with   Medical Management of Chronic Issues             Chronic constipation: Hyperlipidemia LDL goal <100;   Major depression recurrent chronic:  Primary hypertension:    HPI:  He is a 80 year old long term resident of this facility being seen for the management of his chronic illnesses: Chronic constipation: Hyperlipidemia LDL goal <100;   Major depression recurrent chronic:  Primary hypertension. He was unable to tolerate roxanol due to lethargy. He does have some rectal bleeding at times. There are no reports of anxiety. He continues to be followed by hospice care.    Past Medical History:  Diagnosis Date   Anemia    Back pain    Cerebral infarction [facility])    Dysarthria following cerebral infarction    Hyperlipemia    Major depressive disorder    Pyothorax (HCC)    Renal disorder    kidney stones   Spinal stenosis, lumbar region, with neurogenic claudication 07/07/2014   Unspecified dementia without behavioral disturbance     Past Surgical History:  Procedure Laterality Date   BIOPSY  04/13/2020   Procedure: BIOPSY;  Surgeon: Eloise Harman, DO;  [facility];  Service: Endoscopy;;   CATARACT EXTRACTION W/PHACO Left 08/22/2016   Procedure: CATARACT EXTRACTION PHACO AND INTRAOCULAR LENS PLACEMENT ([place]) Left;  Surgeon: [name], MD;  Location: [place];  Service: Ophthalmology;  Laterality: Left;   CATARACT EXTRACTION W/PHACO Right 10/25/2016   Procedure: CATARACT EXTRACTION PHACO AND INTRAOCULAR LENS PLACEMENT ([place]) WITH ISTENT RIGHT  ;  Surgeon: [name], MD;  Location: [place];  Service: Ophthalmology;  Laterality: Right;  TOPICAL RIGHT  ISTENT   trabecular bypass stent was implanted/explanted.   COLONOSCOPY WITH PROPOFOL N/A 04/13/2020   Procedure:  COLONOSCOPY WITH PROPOFOL;  Surgeon: Eloise Harman, DO;  [facility];  Service: Endoscopy;  Laterality: N/A;   FLEXIBLE SIGMOIDOSCOPY  04/11/2020   Procedure: FLEXIBLE SIGMOIDOSCOPY;  Surgeon: Harvel Quale, MD;  [facility];  Service: Gastroenterology;;   Beryle Quant  04/12/2020   Procedure: Beryle Quant;  Surgeon: Montez Morita, Quillian Quince, MD;  [facility];  Service: Gastroenterology;;   INGUINAL HERNIA REPAIR     IR PERC PLEURAL DRAIN W/INDWELL CATH W/IMG GUIDE  01/03/2020   KNEE SURGERY Right    NECK SURGERY      Social History   Socioeconomic History   Marital status: Widowed    Spouse name: Not on file   Number of children: Not on file   Years of education: Not on file   Highest education level: Not on file  Occupational History   Not on file  Tobacco Use   Smoking status: Every Day    Packs/day: 0.75    Types: Cigarettes   Smokeless tobacco: Never  Vaping Use   Vaping Use: Never used  Substance and Sexual Activity   Alcohol use: No   Drug use: Never   Sexual activity: Not on file  Other Topics Concern   Not on file  Social History Narrative   Not on file   Social Determinants of Health   Financial Resource Strain: Not on file  Food Insecurity: Not on file  Transportation Needs: Not on file  Physical Activity:  Not on file  Stress: Not on file  Social Connections: Not on file  Intimate Partner Violence: Not on file   Family History  Problem Relation Age of Onset   Diabetes Mother    Diabetes Brother    Diabetes Sister       VITAL SIGNS BP 139/63   Pulse 76   Temp (!) 96.9 F (36.1 C)   Resp 20   Ht 5\' 5"  (1.651 m)   Wt 142 lb 6.4 oz (64.6 kg)   SpO2 94%   BMI 23.70 kg/m   Outpatient Encounter Medications as of 11/25/2020  Medication Sig   acetaminophen (TYLENOL) 325 MG tablet Take 2 tablets (650 mg total) by mouth every 6 (six) hours as needed for mild pain, fever or  headache.   ARTIFICIAL TEARS 0.2-0.2-1 % SOLN Place 2 drops into both eyes in the morning and at bedtime.    aspirin EC 81 MG tablet Take 81 mg by mouth daily. Swallow whole.   Balsam [place]-Castor Oil (VENELEX) OINT Apply 1 application topically as directed. Apply to sacrum, coccyx and bilateral buttocks qshift   DULoxetine (CYMBALTA) 60 MG capsule Take 1 capsule (60 mg total) by mouth daily.   Multiple Vitamins-Minerals (SENIOR MULTIVITAMIN PLUS PO) Take 1 tablet by mouth daily in the afternoon. 500-300-250 mcg   NON FORMULARY Diet- Regular   Nutritional Supplements (ENSURE ENLIVE PO) Take 1 Can by mouth in the morning, at noon, and at bedtime. For poor meal intake and weight loss   polyethylene glycol (MIRALAX / GLYCOLAX) 17 g packet Take 34 g by mouth 2 (two) times daily as needed (Mix with at least 4 ounces of liquid and drink).   rosuvastatin (CRESTOR) 10 MG tablet Take 1 tablet (10 mg total) by mouth daily.   sucralfate (CARAFATE) 1 GM/10ML suspension Take 1 g by mouth 3 (three) times daily before meals.   [DISCONTINUED] Morphine Sulfate (MORPHINE CONCENTRATE) 10 mg / 0.5 ml concentrated solution Take 0.25 mLs (5 mg total) by mouth every 2 (two) hours as needed for severe pain (give 5 mg with meals also).   No facility-administered encounter medications on file as of 11/25/2020.     SIGNIFICANT DIAGNOSTIC EXAMS  PREVIOUS    01-01-20; ct of chest:  1. Moderate-size loculated right pleural effusion, which may reflect an empyema. Most of the right lower lobe is opacified consistent with pneumonia, atelectasis or a combination. 2. Additional mild dependent linear/reticular atelectasis in the right upper and middle lobes and left lower lobe. Trace left pleural effusion. 3. No visualized centrally obstructing mass. 4. Emphysema and mild aortic atherosclerosis. Aortic Atherosclerosis and Emphysema   03-02-20: ct of head;  1. No acute intracranial abnormality. 2. Expected evolution of right  corona radiata lacunar infarct from prior exam. Remote lacunar infarct in the right thalamus. 3. Stable atrophy and chronic small vessel ischemia.  04-09-20: ct of abdomen and pelvis:  1. Mild wall thickening of the rectum, may represent proctitis. No other explanation for GI bleed. No significant diverticular disease. 2. Consolidative opacity in the dependent right lower lobe with small right pleural effusion. Findings may represent pneumonia or aspiration. Minimal patchy left basilar opacity. 3. Equivocal pre pyloric gastric wall thickening, can be seen with gastritis. 4. Cholelithiasis. Limited assessment for gallbladder inflammation given motion. 5. Bilateral renal scarring, right greater than left. 6. Enlarged prostate gland. Wall thickening at the bladder base may be related to chronic bladder outlet obstruction. Small bladder diverticula. 7. Bilateral femoral head avascular  necrosis without subchondral collapse. Aortic Atherosclerosis   04-13-20: colonoscopy  - Diverticulosis in the sigmoid colon, in the descending colon and in the transverse colon. - Rule out malignancy, partially obstructing tumor in the ascending colon. Biopsied. - Blood in the entire examined colon.  07-23-20: chest x-ray:  There is right lower lobe infiltrates seen.   07-28-20 Chest x-ray:  Mild patchy bibasilar densities right greater than left compatible with pneumonia. In comparison to prior exam; findings are mild worsened with new left basilar infiltrates Mild cardiomegaly Mild osteopenia Mild osteoarthritis   NO NEW EXAMS.    LABS REVIEWED PREVIOUS   01-01-20: wbc 15.9; hgb 11.5; hct 37.3; mcv 88.3 plt 319; glucose 119; bun 19; creat 0.87; k+ 4.2; na++ 136; ca 8.3 GFR>60 01-26-20: wbc 10.4; hgb 10.9; hct 33.6; mcv 81.4 plt 273; glucose 83; bun 18; creat 1.00; k+ 4.6; na++ 136; ca 8.6 GFR>60  02-08-20: wbc 10.4; hgb 12.7; hct 39.9; mcv 81.9 plt 250; glucose 107; bun 20; creat 0.95; k+ 4.6 na++ 136; ca 8.6;  GFR>60 04-09-20: wbc 10.9; hgb 11.8; hct 40.0; mcv 87.0 plt 194; glucose 99; bun 32; creat 1.03; k+ 4.8; na++ 141; ca 8.8 GFR>60; liver normal albumin 3.2 mag 1.6 04-11-20: wbc 7.3; hgb 9.5; hct 31.9; mcv 86.2 plt 178; glucose 82; bun 15; creat 1.05; k+ 4.0; na++ 139; ca 8.4 GFR>60 mag 1.8 04/12/2020: hgb 10.0; hct 33.4  04-20-20: hgb 9.7; hct 32.5; glucose 100; bun 22; creat 1.16; k+ 4.0; na++ 139; ca 8.6 GFR>60 05-18-20: CEA 3.9 07-28-20: wbc 12.5; hgb 12.5; hct 41.9; mcv 78.9 plt 238; glucose 106; bun 23; creat 1.22; k+ 4.2; na++ 137; ca 8.7 GFR >60; d-dimer: 0.49; CRP 1.6    NO NEW LABS.   Review of Systems  Unable to perform ROS: Dementia (unable to participate)   Physical Exam Constitutional:      General: He is not in acute distress.    Appearance: He is well-developed. He is not diaphoretic.  Neck:     Thyroid: No thyromegaly.  Cardiovascular:     Rate and Rhythm: Normal rate and regular rhythm.     Pulses: Normal pulses.     Heart sounds: Normal heart sounds.  Pulmonary:     Effort: Pulmonary effort is normal. No respiratory distress.     Breath sounds: Normal breath sounds.  Abdominal:     General: Bowel sounds are normal. There is no distension.     Palpations: Abdomen is soft.     Tenderness: There is abdominal tenderness.  Musculoskeletal:     Cervical back: Neck supple.     Right lower leg: No edema.     Left lower leg: No edema.     Comments: : Is able to move all extremities  Right hand contracture    Lymphadenopathy:     Cervical: No cervical adenopathy.  Skin:    General: Skin is warm and dry.  Neurological:     Mental Status: He is alert. Mental status is at baseline.  Psychiatric:        Mood and Affect: Mood normal.   ASSESSMENT/ PLAN:  TODAY  Chronic constipation: is stable will continue miralax 34 gm twice daily as needed  2. Hyperlipidemia LDL goal <100; is off medications  3. Major depression recurrent chronic: is stable will continue cymbalta 60  mg daily   4. Primary hypertension: is stable b/p 139/63 will continue to monitor   PREVIOUS   5. PAD (Peripheral arterial disease) is  stable will continue plavix 75 mg daily   6. Aortic atherosclerosis (ct 01-01-20) will monitor   7 Cerebrovascular accident (CVA) unspecified mechanism: is stable   8. Dementia associated with other underlying disease without behavioral disturbance/failure th thrive in adult: is without change weight is 142 pounds will continue to monitor his status.   9. Spinal stenosis lumbar region with neurogenic claudication: is stable will continue cymbalta 60 mg daily   10. Severe protein calorie malnutrition: is stable weight is 143 pounds; albumin 3.2 will continue supplements as directed   11. Adenocarcinoma of colon: he is not a surgical candidate has had rectal bleeding; roxanol stopped as this medication made him too lethargic     Ok Edwards NP [facility]  Contact [phone] Monday through Friday 8am- 5pm  After hours call [phone]

## 2020-11-27 ENCOUNTER — Encounter: Payer: Self-pay | Admitting: Adult Health

## 2020-11-27 ENCOUNTER — Non-Acute Institutional Stay (SKILLED_NURSING_FACILITY): Admitting: Adult Health

## 2020-11-27 DIAGNOSIS — F0280 Dementia in other diseases classified elsewhere without behavioral disturbance: Secondary | ICD-10-CM | POA: Diagnosis not present

## 2020-11-27 DIAGNOSIS — E440 Moderate protein-calorie malnutrition: Secondary | ICD-10-CM | POA: Diagnosis not present

## 2020-11-27 DIAGNOSIS — F339 Major depressive disorder, recurrent, unspecified: Secondary | ICD-10-CM

## 2020-11-27 DIAGNOSIS — C189 Malignant neoplasm of colon, unspecified: Secondary | ICD-10-CM

## 2020-11-27 NOTE — Progress Notes (Signed)
Location:  [place] Room Number: 578 Place of Service:  SNF (31) Provider: Ok Edwards, NP  CODE STATUS: DNR  No Known Allergies  Chief Complaint  Patient presents with   Acute Visit    Care plan meeting    HPI:  We have come together for his care plan meeting. BIMS 5/15 mood 3/30: anxious at times. He requires extensive assist with adls. Is incontinent of bladder and frequently incontinent of bowel. Has ha bloody stools; has colon cancer. He is non ambulatory. There have been no falls. Dietary: weight is stable at 142.4 pounds; has a regular diet and supplements. Therapy not at this time. He is followed by hospice care. He continues to be followed for his chronic illnesses including:   Adenocarcinoma of colon  Dementia associated with underlying disease without behavioral disturbance  Major depression recurrent chronic   Moderate protein calorie malnutrition  Past Medical History:  Diagnosis Date   Anemia    Back pain    Cerebral infarction [facility])    Dysarthria following cerebral infarction    Hyperlipemia    Major depressive disorder    Pyothorax (HCC)    Renal disorder    kidney stones   Spinal stenosis, lumbar region, with neurogenic claudication 07/07/2014   Unspecified dementia without behavioral disturbance     Past Surgical History:  Procedure Laterality Date   BIOPSY  04/13/2020   Procedure: BIOPSY;  Surgeon: Eloise Harman, DO;  [facility];  Service: Endoscopy;;   CATARACT EXTRACTION W/PHACO Left 08/22/2016   Procedure: CATARACT EXTRACTION PHACO AND INTRAOCULAR LENS PLACEMENT ([place]) Left;  Surgeon: [name], MD;  Location: [place];  Service: Ophthalmology;  Laterality: Left;   CATARACT EXTRACTION W/PHACO Right 10/25/2016   Procedure: CATARACT EXTRACTION PHACO AND INTRAOCULAR LENS PLACEMENT ([place]) WITH ISTENT RIGHT  ;  Surgeon: [name], MD;  Location: [place];  Service: Ophthalmology;   Laterality: Right;  TOPICAL RIGHT  ISTENT   trabecular bypass stent was implanted/explanted.   COLONOSCOPY WITH PROPOFOL N/A 04/13/2020   Procedure: COLONOSCOPY WITH PROPOFOL;  Surgeon: Eloise Harman, DO;  [facility];  Service: Endoscopy;  Laterality: N/A;   FLEXIBLE SIGMOIDOSCOPY  04/11/2020   Procedure: FLEXIBLE SIGMOIDOSCOPY;  Surgeon: Harvel Quale, MD;  [facility];  Service: Gastroenterology;;   Beryle Quant  04/12/2020   Procedure: Beryle Quant;  Surgeon: Montez Morita, Quillian Quince, MD;  [facility];  Service: Gastroenterology;;   INGUINAL HERNIA REPAIR     IR PERC PLEURAL DRAIN W/INDWELL CATH W/IMG GUIDE  01/03/2020   KNEE SURGERY Right    NECK SURGERY      Social History   Socioeconomic History   Marital status: Widowed    Spouse name: Not on file   Number of children: Not on file   Years of education: Not on file   Highest education level: Not on file  Occupational History   Not on file  Tobacco Use   Smoking status: Every Day    Packs/day: 0.75    Types: Cigarettes   Smokeless tobacco: Never  Vaping Use   Vaping Use: Never used  Substance and Sexual Activity   Alcohol use: No   Drug use: Never   Sexual activity: Not on file  Other Topics Concern   Not on file  Social History Narrative   Not on file   Social Determinants of Health   Financial Resource Strain: Not on  file  Food Insecurity: Not on file  Transportation Needs: Not on file  Physical Activity: Not on file  Stress: Not on file  Social Connections: Not on file  Intimate Partner Violence: Not on file   Family History  Problem Relation Age of Onset   Diabetes Mother    Diabetes Brother    Diabetes Sister       VITAL SIGNS BP 139/63   Pulse 76   Temp 98.1 F (36.7 C)   Resp 20   Ht 5\' 5"  (1.651 m)   Wt 142 lb 6.4 oz (64.6 kg)   SpO2 94%   BMI 23.70 kg/m   Outpatient Encounter Medications as of 11/27/2020   Medication Sig   acetaminophen (TYLENOL) 325 MG tablet Take 2 tablets (650 mg total) by mouth every 6 (six) hours as needed for mild pain, fever or headache.   ARTIFICIAL TEARS 0.2-0.2-1 % SOLN Place 2 drops into both eyes in the morning and at bedtime.    Balsam [place]-Castor Oil (VENELEX) OINT Apply 1 application topically as directed. Apply to sacrum, coccyx and bilateral buttocks qshift   DULoxetine (CYMBALTA) 60 MG capsule Take 1 capsule (60 mg total) by mouth daily.   MORPHINE SULFATE, CONCENTRATE, PO Take by mouth. 100 mg/5 mL (20 mg/mL); amt: 5mg /0.31ml; oral Q2hrs for pain or respiratory distress Every 2 Hours - PRN   NON FORMULARY Diet- Regular   Nutritional Supplements (ENSURE ENLIVE PO) Take 1 Can by mouth in the morning, at noon, and at bedtime. For poor meal intake and weight loss   polyethylene glycol (MIRALAX / GLYCOLAX) 17 g packet Take 34 g by mouth 2 (two) times daily as needed (Mix with at least 4 ounces of liquid and drink).   sucralfate (CARAFATE) 1 GM/10ML suspension Take 1 g by mouth 3 (three) times daily before meals.   [DISCONTINUED] aspirin EC 81 MG tablet Take 81 mg by mouth daily. Swallow whole.   [DISCONTINUED] Multiple Vitamins-Minerals (SENIOR MULTIVITAMIN PLUS PO) Take 1 tablet by mouth daily in the afternoon. 500-300-250 mcg   [DISCONTINUED] rosuvastatin (CRESTOR) 10 MG tablet Take 1 tablet (10 mg total) by mouth daily.   No facility-administered encounter medications on file as of 11/27/2020.     SIGNIFICANT DIAGNOSTIC EXAMS  PREVIOUS    01-01-20; ct of chest:  1. Moderate-size loculated right pleural effusion, which may reflect an empyema. Most of the right lower lobe is opacified consistent with pneumonia, atelectasis or a combination. 2. Additional mild dependent linear/reticular atelectasis in the right upper and middle lobes and left lower lobe. Trace left pleural effusion. 3. No visualized centrally obstructing mass. 4. Emphysema and mild aortic  atherosclerosis. Aortic Atherosclerosis and Emphysema   03-02-20: ct of head;  1. No acute intracranial abnormality. 2. Expected evolution of right corona radiata lacunar infarct from prior exam. Remote lacunar infarct in the right thalamus. 3. Stable atrophy and chronic small vessel ischemia.  04-09-20: ct of abdomen and pelvis:  1. Mild wall thickening of the rectum, may represent proctitis. No other explanation for GI bleed. No significant diverticular disease. 2. Consolidative opacity in the dependent right lower lobe with small right pleural effusion. Findings may represent pneumonia or aspiration. Minimal patchy left basilar opacity. 3. Equivocal pre pyloric gastric wall thickening, can be seen with gastritis. 4. Cholelithiasis. Limited assessment for gallbladder inflammation given motion. 5. Bilateral renal scarring, right greater than left. 6. Enlarged prostate gland. Wall thickening at the bladder base may be related to chronic bladder outlet  obstruction. Small bladder diverticula. 7. Bilateral femoral head avascular necrosis without subchondral collapse. Aortic Atherosclerosis   04-13-20: colonoscopy  - Diverticulosis in the sigmoid colon, in the descending colon and in the transverse colon. - Rule out malignancy, partially obstructing tumor in the ascending colon. Biopsied. - Blood in the entire examined colon.  07-23-20: chest x-ray:  There is right lower lobe infiltrates seen.   07-28-20 Chest x-ray:  Mild patchy bibasilar densities right greater than left compatible with pneumonia. In comparison to prior exam; findings are mild worsened with new left basilar infiltrates Mild cardiomegaly Mild osteopenia Mild osteoarthritis   NO NEW EXAMS.    LABS REVIEWED PREVIOUS   01-01-20: wbc 15.9; hgb 11.5; hct 37.3; mcv 88.3 plt 319; glucose 119; bun 19; creat 0.87; k+ 4.2; na++ 136; ca 8.3 GFR>60 01-26-20: wbc 10.4; hgb 10.9; hct 33.6; mcv 81.4 plt 273; glucose 83; bun 18; creat  1.00; k+ 4.6; na++ 136; ca 8.6 GFR>60  02-08-20: wbc 10.4; hgb 12.7; hct 39.9; mcv 81.9 plt 250; glucose 107; bun 20; creat 0.95; k+ 4.6 na++ 136; ca 8.6; GFR>60 04-09-20: wbc 10.9; hgb 11.8; hct 40.0; mcv 87.0 plt 194; glucose 99; bun 32; creat 1.03; k+ 4.8; na++ 141; ca 8.8 GFR>60; liver normal albumin 3.2 mag 1.6 04-11-20: wbc 7.3; hgb 9.5; hct 31.9; mcv 86.2 plt 178; glucose 82; bun 15; creat 1.05; k+ 4.0; na++ 139; ca 8.4 GFR>60 mag 1.8 04/02/2020: hgb 10.0; hct 33.4  04-20-20: hgb 9.7; hct 32.5; glucose 100; bun 22; creat 1.16; k+ 4.0; na++ 139; ca 8.6 GFR>60 05-18-20: CEA 3.9 07-28-20: wbc 12.5; hgb 12.5; hct 41.9; mcv 78.9 plt 238; glucose 106; bun 23; creat 1.22; k+ 4.2; na++ 137; ca 8.7 GFR >60; d-dimer: 0.49; CRP 1.6    NO NEW LABS.    Review of Systems  Unable to perform ROS: Dementia (unable to participate)   Physical Exam Constitutional:      General: He is not in acute distress.    Appearance: He is well-developed. He is not diaphoretic.  Neck:     Thyroid: No thyromegaly.  Cardiovascular:     Rate and Rhythm: Normal rate and regular rhythm.     Pulses: Normal pulses.     Heart sounds: Normal heart sounds.  Pulmonary:     Effort: Pulmonary effort is normal. No respiratory distress.     Breath sounds: Normal breath sounds.  Abdominal:     General: Bowel sounds are normal. There is distension.     Palpations: Abdomen is soft.     Tenderness: There is abdominal tenderness.  Musculoskeletal:     Cervical back: Neck supple.     Right lower leg: No edema.     Left lower leg: No edema.     Comments:  Is able to move all extremities  Right hand contracture     Lymphadenopathy:     Cervical: No cervical adenopathy.  Skin:    General: Skin is warm and dry.  Neurological:     Mental Status: He is alert. Mental status is at baseline.  Psychiatric:        Mood and Affect: Mood normal.     ASSESSMENT/ PLAN:  TODAY  Adenocarcinoma of colon Dementia associated with  underlying disease without behavioral disturbance Major depression recurrent chronic Moderate protein calorie malnutrition  Will continue current medications Will continue current plan of care Will continue to monitor his status.    Time spent with patient: 40 minutes: medications; plan of  care.     Ok Edwards NP [facility]  Contact [phone] Monday through Friday 8am- 5pm  After hours call [phone]

## 2020-12-17 ENCOUNTER — Encounter: Payer: Self-pay | Admitting: Adult Health

## 2020-12-17 ENCOUNTER — Non-Acute Institutional Stay (SKILLED_NURSING_FACILITY): Admitting: Adult Health

## 2020-12-17 DIAGNOSIS — F0280 Dementia in other diseases classified elsewhere without behavioral disturbance: Secondary | ICD-10-CM

## 2020-12-17 DIAGNOSIS — C189 Malignant neoplasm of colon, unspecified: Secondary | ICD-10-CM | POA: Diagnosis not present

## 2020-12-17 NOTE — Progress Notes (Signed)
Location:  Time Room Number: 104 Place of Service:  SNF (31)   CODE STATUS: dnr   No Known Allergies  Chief Complaint  Patient presents with   Acute Visit    Change in status     HPI:  He has had a change in his status. He continues to be followed by hospice care. He is not eating or drinking at this time. He has prn morphine ordered. There are no indications of pain or distress present. He does have family at his bedside. He is awake.   Past Medical History:  Diagnosis Date   Anemia    Back pain    Cerebral infarction [facility])    Dysarthria following cerebral infarction    Hyperlipemia    Major depressive disorder    Pyothorax (HCC)    Renal disorder    kidney stones   Spinal stenosis, lumbar region, with neurogenic claudication 07/07/2014   Unspecified dementia without behavioral disturbance     Past Surgical History:  Procedure Laterality Date   BIOPSY  04/13/2020   Procedure: BIOPSY;  Surgeon: Eloise Harman, DO;  [facility];  Service: Endoscopy;;   CATARACT EXTRACTION W/PHACO Left 08/22/2016   Procedure: CATARACT EXTRACTION PHACO AND INTRAOCULAR LENS PLACEMENT ([place]) Left;  Surgeon: [name], MD;  Location: [place];  Service: Ophthalmology;  Laterality: Left;   CATARACT EXTRACTION W/PHACO Right 10/25/2016   Procedure: CATARACT EXTRACTION PHACO AND INTRAOCULAR LENS PLACEMENT ([place]) WITH ISTENT RIGHT  ;  Surgeon: [name], MD;  Location: [place];  Service: Ophthalmology;  Laterality: Right;  TOPICAL RIGHT  ISTENT   trabecular bypass stent was implanted/explanted.   COLONOSCOPY WITH PROPOFOL N/A 04/13/2020   Procedure: COLONOSCOPY WITH PROPOFOL;  Surgeon: Eloise Harman, DO;  [facility];  Service: Endoscopy;  Laterality: N/A;   FLEXIBLE SIGMOIDOSCOPY  04/11/2020   Procedure: FLEXIBLE SIGMOIDOSCOPY;  Surgeon: Harvel Quale, MD;  [facility];   Service: Gastroenterology;;   Beryle Quant  04/12/2020   Procedure: Beryle Quant;  Surgeon: Montez Morita, Quillian Quince, MD;  [facility];  Service: Gastroenterology;;   INGUINAL HERNIA REPAIR     IR PERC PLEURAL DRAIN W/INDWELL CATH W/IMG GUIDE  01/03/2020   KNEE SURGERY Right    NECK SURGERY      Social History   Socioeconomic History   Marital status: Widowed    Spouse name: Not on file   Number of children: Not on file   Years of education: Not on file   Highest education level: Not on file  Occupational History   Not on file  Tobacco Use   Smoking status: Every Day    Packs/day: 0.75    Types: Cigarettes   Smokeless tobacco: Never  Vaping Use   Vaping Use: Never used  Substance and Sexual Activity   Alcohol use: No   Drug use: Never   Sexual activity: Not on file  Other Topics Concern   Not on file  Social History Narrative   Not on file   Social Determinants of Health   Financial Resource Strain: Not on file  Food Insecurity: Not on file  Transportation Needs: Not on file  Physical Activity: Not on file  Stress: Not on file  Social Connections: Not on file  Intimate Partner Violence: Not on file   Family History  Problem Relation Age of Onset   Diabetes Mother    Diabetes Brother  Diabetes Sister       VITAL SIGNS BP 136/67   Pulse 78   Temp (!) 97 F (36.1 C)   Ht 5\' 5"  (1.651 m)   Wt 140 lb 9.6 oz (63.8 kg)   BMI 23.40 kg/m   Outpatient Encounter Medications as of 12/17/2020  Medication Sig   acetaminophen (TYLENOL) 325 MG tablet Take 2 tablets (650 mg total) by mouth every 6 (six) hours as needed for mild pain, fever or headache.   ARTIFICIAL TEARS 0.2-0.2-1 % SOLN Place 2 drops into both eyes in the morning and at bedtime.    Balsam [place]-Castor Oil (VENELEX) OINT Apply 1 application topically as directed. Apply to sacrum, coccyx and bilateral buttocks qshift   DULoxetine (CYMBALTA) 60 MG capsule Take 1  capsule (60 mg total) by mouth daily.   MORPHINE SULFATE, CONCENTRATE, PO Take by mouth. 100 mg/5 mL (20 mg/mL); amt: 5mg /0.40ml; oral Q2hrs for pain or respiratory distress Every 2 Hours - PRN   NON FORMULARY Diet- Regular   Nutritional Supplements (ENSURE ENLIVE PO) Take 1 Can by mouth in the morning, at noon, and at bedtime. For poor meal intake and weight loss   polyethylene glycol (MIRALAX / GLYCOLAX) 17 g packet Take 34 g by mouth 2 (two) times daily as needed (Mix with at least 4 ounces of liquid and drink).   sucralfate (CARAFATE) 1 GM/10ML suspension Take 1 g by mouth 3 (three) times daily before meals.   No facility-administered encounter medications on file as of 12/17/2020.     SIGNIFICANT DIAGNOSTIC EXAMS   PREVIOUS    01-01-20; ct of chest:  1. Moderate-size loculated right pleural effusion, which may reflect an empyema. Most of the right lower lobe is opacified consistent with pneumonia, atelectasis or a combination. 2. Additional mild dependent linear/reticular atelectasis in the right upper and middle lobes and left lower lobe. Trace left pleural effusion. 3. No visualized centrally obstructing mass. 4. Emphysema and mild aortic atherosclerosis. Aortic Atherosclerosis and Emphysema   03-02-20: ct of head;  1. No acute intracranial abnormality. 2. Expected evolution of right corona radiata lacunar infarct from prior exam. Remote lacunar infarct in the right thalamus. 3. Stable atrophy and chronic small vessel ischemia.  04-09-20: ct of abdomen and pelvis:  1. Mild wall thickening of the rectum, may represent proctitis. No other explanation for GI bleed. No significant diverticular disease. 2. Consolidative opacity in the dependent right lower lobe with small right pleural effusion. Findings may represent pneumonia or aspiration. Minimal patchy left basilar opacity. 3. Equivocal pre pyloric gastric wall thickening, can be seen with gastritis. 4. Cholelithiasis. Limited  assessment for gallbladder inflammation given motion. 5. Bilateral renal scarring, right greater than left. 6. Enlarged prostate gland. Wall thickening at the bladder base may be related to chronic bladder outlet obstruction. Small bladder diverticula. 7. Bilateral femoral head avascular necrosis without subchondral collapse. Aortic Atherosclerosis   04-13-20: colonoscopy  - Diverticulosis in the sigmoid colon, in the descending colon and in the transverse colon. - Rule out malignancy, partially obstructing tumor in the ascending colon. Biopsied. - Blood in the entire examined colon.  07-23-20: chest x-ray:  There is right lower lobe infiltrates seen.   07-28-20 Chest x-ray:  Mild patchy bibasilar densities right greater than left compatible with pneumonia. In comparison to prior exam; findings are mild worsened with new left basilar infiltrates Mild cardiomegaly Mild osteopenia Mild osteoarthritis   NO NEW EXAMS.    LABS REVIEWED PREVIOUS   01-01-20: wbc  15.9; hgb 11.5; hct 37.3; mcv 88.3 plt 319; glucose 119; bun 19; creat 0.87; k+ 4.2; na++ 136; ca 8.3 GFR>60 01-26-20: wbc 10.4; hgb 10.9; hct 33.6; mcv 81.4 plt 273; glucose 83; bun 18; creat 1.00; k+ 4.6; na++ 136; ca 8.6 GFR>60  02-08-20: wbc 10.4; hgb 12.7; hct 39.9; mcv 81.9 plt 250; glucose 107; bun 20; creat 0.95; k+ 4.6 na++ 136; ca 8.6; GFR>60 04-09-20: wbc 10.9; hgb 11.8; hct 40.0; mcv 87.0 plt 194; glucose 99; bun 32; creat 1.03; k+ 4.8; na++ 141; ca 8.8 GFR>60; liver normal albumin 3.2 mag 1.6 04-11-20: wbc 7.3; hgb 9.5; hct 31.9; mcv 86.2 plt 178; glucose 82; bun 15; creat 1.05; k+ 4.0; na++ 139; ca 8.4 GFR>60 mag 1.8 04/17/2020: hgb 10.0; hct 33.4  04-20-20: hgb 9.7; hct 32.5; glucose 100; bun 22; creat 1.16; k+ 4.0; na++ 139; ca 8.6 GFR>60 05-18-20: CEA 3.9 07-28-20: wbc 12.5; hgb 12.5; hct 41.9; mcv 78.9 plt 238; glucose 106; bun 23; creat 1.22; k+ 4.2; na++ 137; ca 8.7 GFR >60; d-dimer: 0.49; CRP 1.6    NO NEW LABS.   Review  of Systems  Unable to perform ROS: Dementia (unable to participate)   Physical Exam Constitutional:      General: He is not in acute distress.    Appearance: He is well-developed. He is not diaphoretic.  Neck:     Thyroid: No thyromegaly.  Cardiovascular:     Rate and Rhythm: Normal rate and regular rhythm.     Pulses: Normal pulses.     Heart sounds: Normal heart sounds.  Pulmonary:     Effort: Pulmonary effort is normal. No respiratory distress.     Breath sounds: Normal breath sounds.  Abdominal:     General: Bowel sounds are normal. There is no distension.     Palpations: Abdomen is soft.     Tenderness: There is no abdominal tenderness.  Musculoskeletal:     Cervical back: Neck supple.     Right lower leg: No edema.     Left lower leg: No edema.  Lymphadenopathy:     Cervical: No cervical adenopathy.  Skin:    General: Skin is warm and dry.  Neurological:     Comments: Is awake no indications of pain or distress present.      ASSESSMENT/ PLAN:  TODAY  Adenocarcinoma of colon Dementia associated with other underlying disease without behavioral disturbance  Will stop all oral medications Will continue roxanol as ordered.  Will continue to focus his care on comfort His status is terminal    Ok Edwards NP [facility]  Contact [phone] Monday through Friday 8am- 5pm  After hours call [phone]

## 2020-12-29 ENCOUNTER — Encounter: Payer: Self-pay | Admitting: Internal Medicine

## 2020-12-29 ENCOUNTER — Non-Acute Institutional Stay (SKILLED_NURSING_FACILITY): Payer: Medicare Other | Admitting: Internal Medicine

## 2020-12-29 DIAGNOSIS — E43 Unspecified severe protein-calorie malnutrition: Secondary | ICD-10-CM

## 2020-12-29 DIAGNOSIS — F0280 Dementia in other diseases classified elsewhere without behavioral disturbance: Secondary | ICD-10-CM

## 2020-12-29 DIAGNOSIS — C189 Malignant neoplasm of colon, unspecified: Secondary | ICD-10-CM | POA: Diagnosis not present

## 2020-12-29 DIAGNOSIS — R627 Adult failure to thrive: Secondary | ICD-10-CM

## 2020-12-29 NOTE — Progress Notes (Signed)
   NURSING HOME LOCATION:  [facility] ROOM NUMBER:    CODE STATUS:  DNR/ on Hospice  PCP:  Betty [name] MD  This is a nursing facility follow up visit of chronic medical diagnoses & to document compliance with Regulation 483.30 (c) in [employer] [place] Manual Phase 2 which mandates caregiver visit ( visits can alternate among physician, PA or NP as per statutes) within 10 days of 30 days / 60 days/ 90 days post admission to SNF date    Interim medical record and care since last SNF visit was updated with review of diagnostic studies and change in clinical status since last visit were documented.  HPI: He is a permanent resident of this facility with medical diagnoses of untreated adenocarcinoma the colon, history of cerebral infarction complicated by dysarthria and dementia, dyslipidemia, history of nephrolithiasis,& lumbar spinal stenosis with neurogenic claudication. Most recent labs were 07/28/2020 and revealed a creatinine of 1.22 with a GFR greater than 60 indicating CKD stage II.  Serially GFR s have been stable.  He exhibited a microcytic, hypochromic anemia with H/H of 12.5/41.9.  Serially this represented an improvement over the previous 3 months when H/H was 9.7/32.5.  Review of systems: He can provide no history.  His responses are mumbled and unintelligible.  He did seemingly deny any pain.  His sister states that he told her he was having pain but did not localize it.  I assured her that he was receiving pain medicine as needed.  He was unable to follow any commands.  He would randomly extend fingers on the left hand with no apparent purpose.  Physical exam:  Pertinent or positive findings: He appears grossly cachectic with obvious total body wasting.  Limbs are atrophic, interosseous wasting is present, and his ribs are visible.  He stares blankly and does not respond with focused replies as noted.  Ptosis is present bilaterally.  He has a  mustache.  He has a small goatee.  He has multiple missing teeth and the remaining teeth are stained and plaque encrusted.  Heart sounds are markedly distant heard basically only in the epigastrium.  He has low-grade musical rhonchi diffusely.  Abdomen is scaphoid.  Pedal pulses are decreased.  He has isolated DIP osteoarthritic changes.  General appearance: no acute distress, increased work of breathing is present.   Lymphatic: No lymphadenopathy about the head, neck, axilla. Eyes: No conjunctival inflammation or lid edema is present. There is no scleral icterus. Ears:  External ear exam shows no significant lesions or deformities.   Nose:  External nasal examination shows no deformity or inflammation. Nasal mucosa are pink and moist without lesions, exudates Neck:  No thyromegaly, masses, tenderness noted.    Heart:  No gallop, murmur, click, rub .  Lungs:  without wheezes,rales, rubs. Abdomen: Bowel sounds are normal. Abdomen is soft and nontender with no organomegaly, hernias, masses. GU: Deferred  Extremities:  No cyanosis, clubbing, edema  Neurologic exam :Balance, Rhomberg, finger to nose testing could not be completed due to clinical state Skin: Warm & dry w/o tenting. No significant lesions or rash.  See summary under each active problem in the Problem List with associated updated therapeutic plan

## 2020-12-29 NOTE — Assessment & Plan Note (Signed)
Responses are mumbled and unintelligible.  He cannot follow commands.  No behavioral issues reported at SNF.

## 2020-12-29 NOTE — Assessment & Plan Note (Addendum)
Hospice is involved.  Morphine has been ordered every 2 hours as needed.  This has been used 8 times in the last 2 weeks.

## 2020-12-29 NOTE — Assessment & Plan Note (Signed)
Pilar Plate cachexia is present with diffuse muscle wasting and limb atrophy.  Ribs are actually visible.

## 2020-12-29 NOTE — Patient Instructions (Signed)
See assessment and plan under each diagnosis in the problem list and acutely for this visit 

## 2020-12-31 NOTE — Assessment & Plan Note (Signed)
Hospice involvement most appropriate & appreciated.

## 2021-01-31 DEATH — deceased

## 2021-12-13 NOTE — Progress Notes (Signed)
This encounter was created in error - please disregard.

## 2022-09-13 IMAGING — CT CT HEAD W/O CM
4 series · 16 of 47 positions shown, 18 images · non-contrast
Comparison: Head CT and brain MRI 11/01/2019

CLINICAL DATA: Stroke, follow up new drooling reported by family

EXAM:
CT HEAD WITHOUT CONTRAST
TECHNIQUE: Contiguous axial images were obtained from the base of the skull
through the vertex without intravenous contrast.

[Series 3: head without · axial · non-contrast · 0.44mm/px · z∈[-90,+30]mm · 7 of 33 slices shown, 9 images]
[im 5/33  brain]
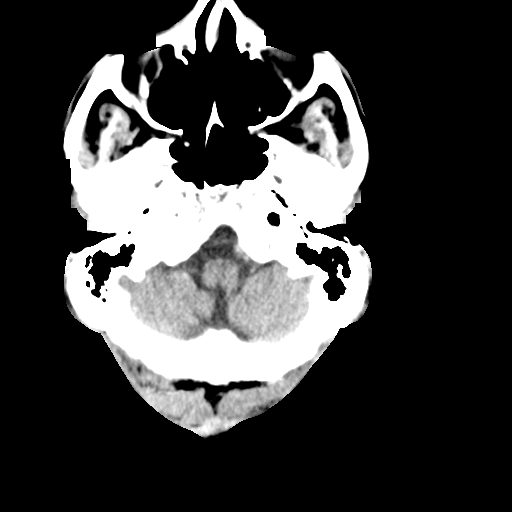
[im 5/33  bone]
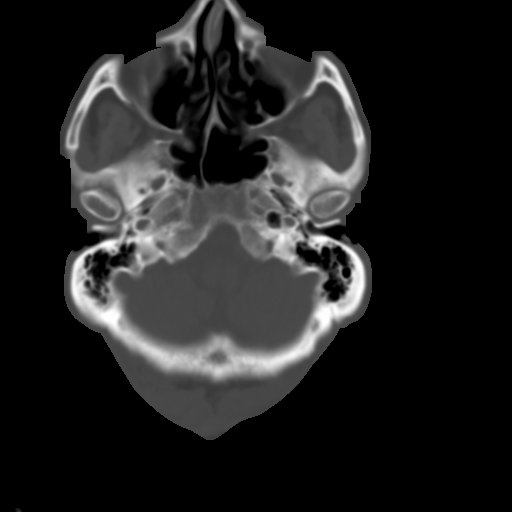
[im 9/33  brain]
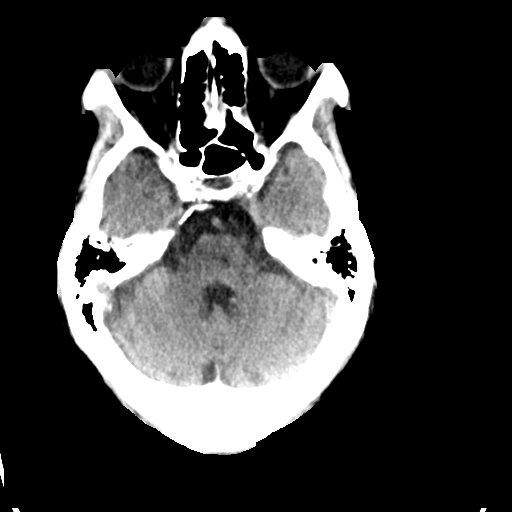
[im 13/33  brain]
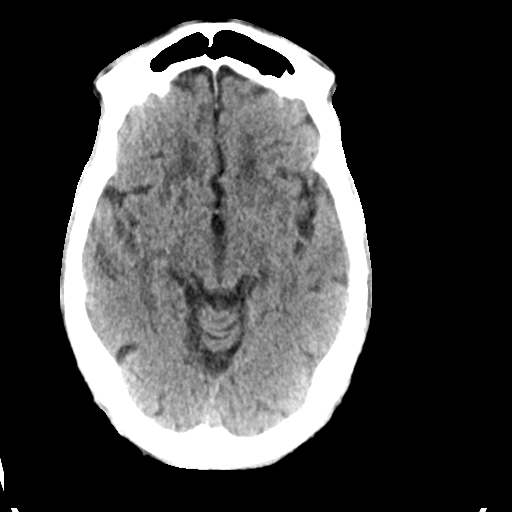
[im 17/33  brain]
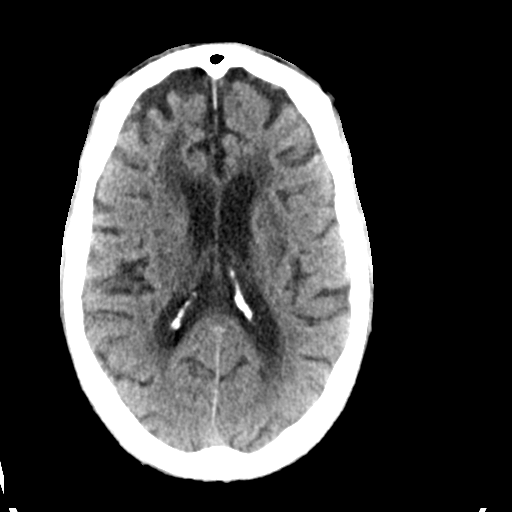
[im 21/33  brain]
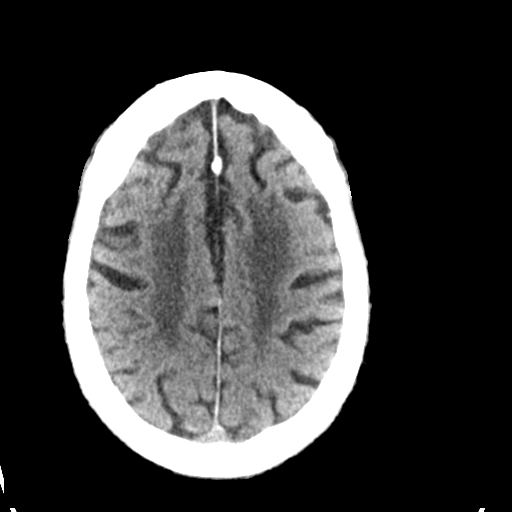
[im 21/33  bone]
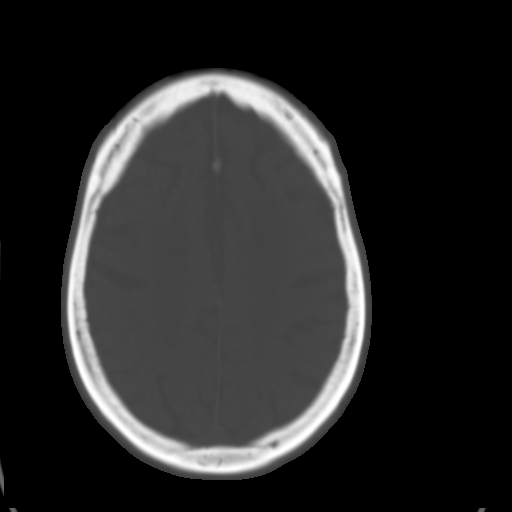
[im 25/33  brain]
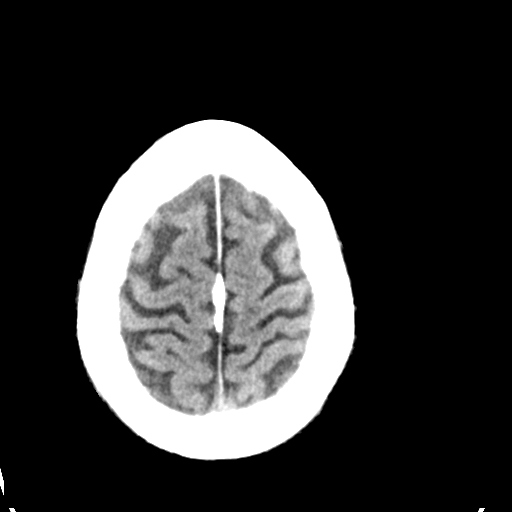
[im 29/33  brain]
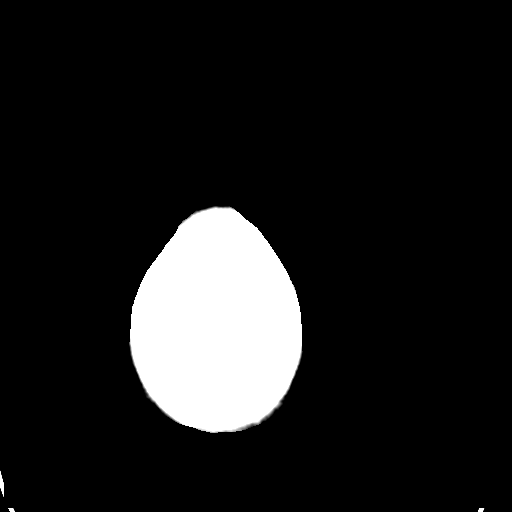

[Series 4: head bone · axial · 0.44mm/px · z∈[-94,-62]mm · 3 of 82 slices shown]
[im 9/82  bone]
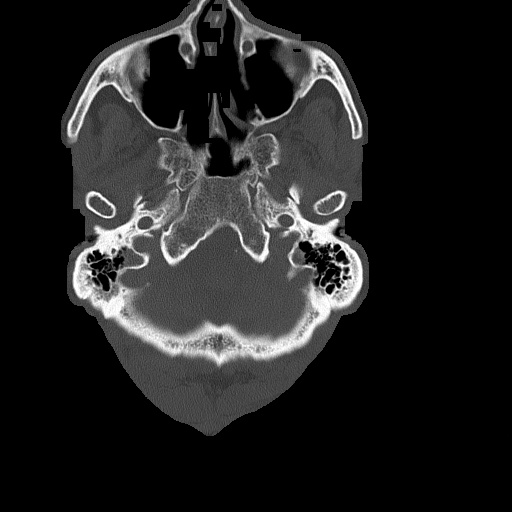
[im 17/82  bone]
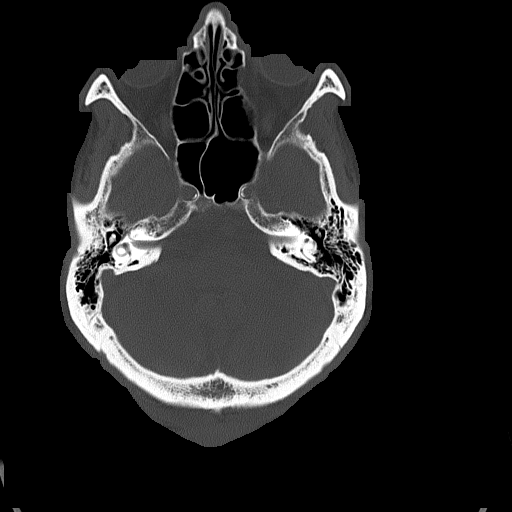
[im 25/82  bone]
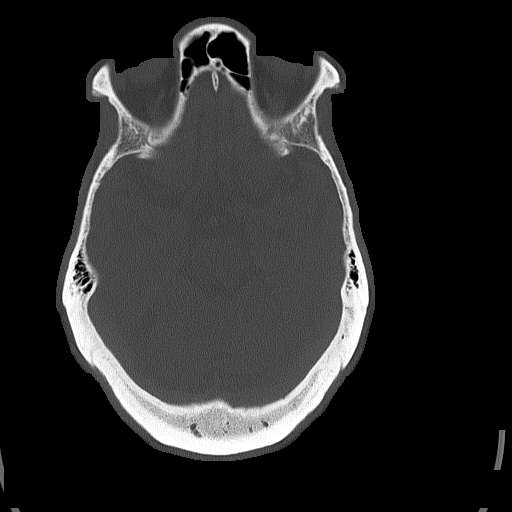

[Series 5: head without cor · coronal · non-contrast · 0.34mm/px · 3 of 72 slices shown]
[im 25/72  brain]
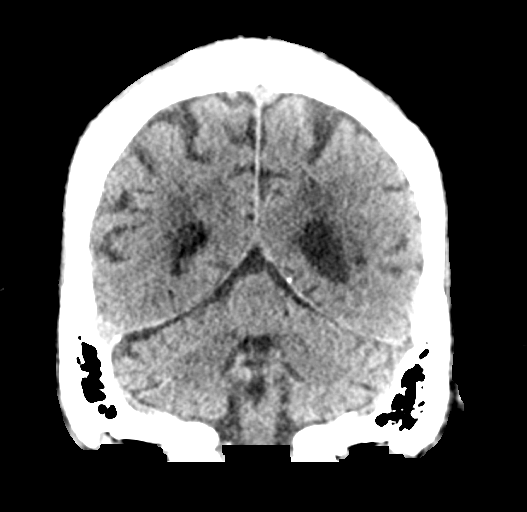
[im 32/72  brain]
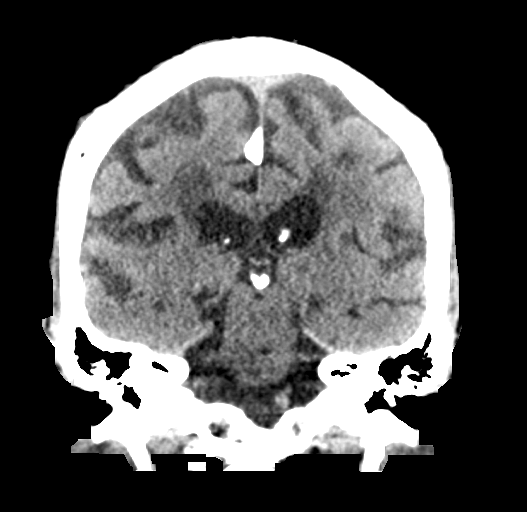
[im 40/72  brain]
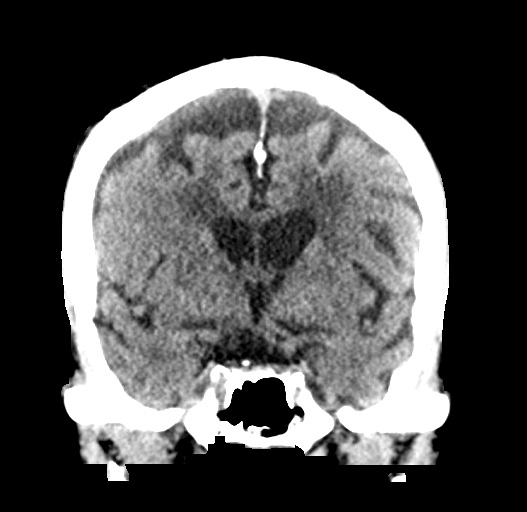

[Series 6: head without sag · sagittal · non-contrast · 0.34mm/px · 3 of 53 slices shown]
[im 18/53  brain]
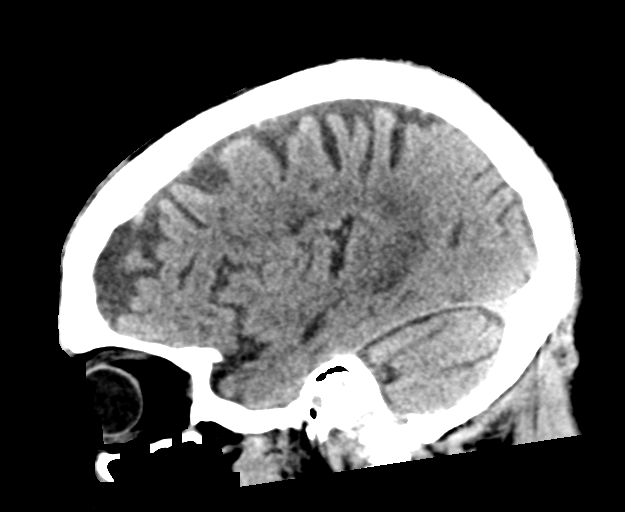
[im 27/53  brain]
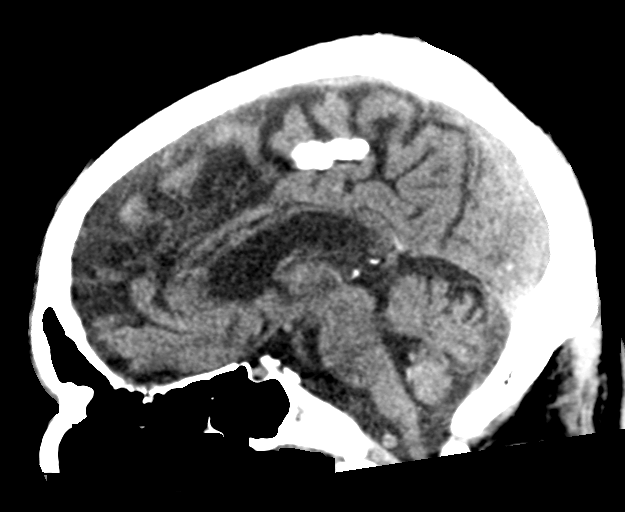
[im 35/53  brain]
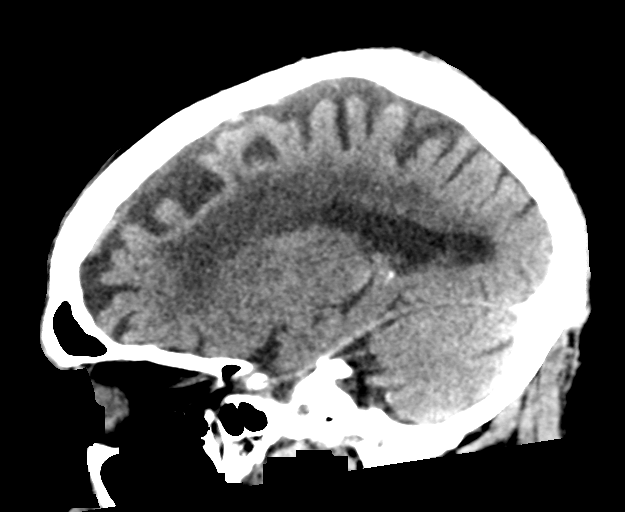

[16 of 47 positions shown; findings below may reference images not displayed]

FINDINGS: Brain: No hemorrhage or evidence of acute ischemia. Stable degree of
atrophy and chronic small vessel ischemia. Expected evolution of
right corona radiata lacunar infarct from prior exam with small
focal hypodensity. Remote lacunar infarct in the right thalamus. No
hydrocephalus, no midline shift or mass effect. No subdural or
extra-axial collection. Basilar cisterns are patent.

Vascular: Atherosclerosis of skullbase vasculature without
hyperdense vessel or abnormal calcification.

Skull: No fracture or focal lesion.

Sinuses/Orbits: No acute findings. Remote medial left orbital
fracture unchanged. Mastoid air cells are clear. Bilateral cataract
resection.

Other: None.
IMPRESSION: 1. No acute intracranial abnormality.
2. Expected evolution of right corona radiata lacunar infarct from
prior exam. Remote lacunar infarct in the right thalamus.
3. Stable atrophy and chronic small vessel ischemia.
